# Patient Record
Sex: Female | Born: 1955 | Race: Black or African American | Hispanic: No | Smoking: Former smoker
Health system: Southern US, Community
[De-identification: ages and names within clinical notes are randomized; demographics above are authoritative.]

## PROBLEM LIST (undated history)

## (undated) ENCOUNTER — Ambulatory Visit: Source: Ambulatory Visit

## (undated) ENCOUNTER — Ambulatory Visit: Source: Home / Self Care

## (undated) DIAGNOSIS — M1990 Unspecified osteoarthritis, unspecified site: Secondary | ICD-10-CM

## (undated) DIAGNOSIS — K219 Gastro-esophageal reflux disease without esophagitis: Secondary | ICD-10-CM

## (undated) DIAGNOSIS — I517 Cardiomegaly: Secondary | ICD-10-CM

## (undated) DIAGNOSIS — E7800 Pure hypercholesterolemia, unspecified: Secondary | ICD-10-CM

## (undated) DIAGNOSIS — R0600 Dyspnea, unspecified: Secondary | ICD-10-CM

## (undated) DIAGNOSIS — J45909 Unspecified asthma, uncomplicated: Secondary | ICD-10-CM

## (undated) DIAGNOSIS — M545 Low back pain, unspecified: Secondary | ICD-10-CM

## (undated) DIAGNOSIS — E1140 Type 2 diabetes mellitus with diabetic neuropathy, unspecified: Secondary | ICD-10-CM

## (undated) DIAGNOSIS — K5792 Diverticulitis of intestine, part unspecified, without perforation or abscess without bleeding: Secondary | ICD-10-CM

## (undated) DIAGNOSIS — S0300XA Dislocation of jaw, unspecified side, initial encounter: Secondary | ICD-10-CM

## (undated) DIAGNOSIS — G4730 Sleep apnea, unspecified: Secondary | ICD-10-CM

## (undated) DIAGNOSIS — A159 Respiratory tuberculosis unspecified: Secondary | ICD-10-CM

## (undated) DIAGNOSIS — Z8709 Personal history of other diseases of the respiratory system: Secondary | ICD-10-CM

## (undated) DIAGNOSIS — J449 Chronic obstructive pulmonary disease, unspecified: Secondary | ICD-10-CM

## (undated) DIAGNOSIS — R296 Repeated falls: Secondary | ICD-10-CM

## (undated) DIAGNOSIS — G8929 Other chronic pain: Secondary | ICD-10-CM

## (undated) DIAGNOSIS — E119 Type 2 diabetes mellitus without complications: Secondary | ICD-10-CM

## (undated) DIAGNOSIS — R002 Palpitations: Secondary | ICD-10-CM

## (undated) DIAGNOSIS — E559 Vitamin D deficiency, unspecified: Secondary | ICD-10-CM

## (undated) DIAGNOSIS — G5600 Carpal tunnel syndrome, unspecified upper limb: Secondary | ICD-10-CM

## (undated) DIAGNOSIS — I10 Essential (primary) hypertension: Secondary | ICD-10-CM

## (undated) DIAGNOSIS — I6523 Occlusion and stenosis of bilateral carotid arteries: Secondary | ICD-10-CM

## (undated) DIAGNOSIS — K589 Irritable bowel syndrome without diarrhea: Secondary | ICD-10-CM

## (undated) HISTORY — DX: Pure hypercholesterolemia, unspecified: E78.00

## (undated) HISTORY — PX: EYE SURGERY: SHX253

## (undated) HISTORY — PX: OTHER SURGICAL HISTORY: SHX169

## (undated) HISTORY — DX: Chronic obstructive pulmonary disease, unspecified: J44.9

## (undated) HISTORY — PX: ESOPHAGOGASTRODUODENOSCOPY ENDOSCOPY: SHX5814

## (undated) HISTORY — DX: Low back pain, unspecified: M54.50

## (undated) HISTORY — DX: Other chronic pain: G89.29

## (undated) HISTORY — PX: CARPAL TUNNEL RELEASE: SHX101

## (undated) HISTORY — DX: Type 2 diabetes mellitus with diabetic neuropathy, unspecified: E11.40

## (undated) HISTORY — DX: Occlusion and stenosis of bilateral carotid arteries: I65.23

## (undated) HISTORY — DX: Carpal tunnel syndrome, unspecified upper limb: G56.00

## (undated) HISTORY — DX: Unspecified asthma, uncomplicated: J45.909

## (undated) HISTORY — DX: Unspecified osteoarthritis, unspecified site: M19.90

## (undated) HISTORY — DX: Repeated falls: R29.6

## (undated) HISTORY — DX: Vitamin D deficiency, unspecified: E55.9

## (undated) HISTORY — PX: TONSILLECTOMY: SUR1361

## (undated) HISTORY — PX: NASAL SEPTUM SURGERY: SHX37

## (undated) HISTORY — PX: ROTATOR CUFF REPAIR: SHX139

---

## 1991-12-06 HISTORY — PX: ABDOMINAL HYSTERECTOMY: SHX81

## 2013-12-05 HISTORY — PX: OTHER SURGICAL HISTORY: SHX169

## 2014-07-27 ENCOUNTER — Encounter (HOSPITAL_COMMUNITY): Payer: Self-pay | Admitting: Emergency Medicine

## 2014-07-27 ENCOUNTER — Emergency Department (HOSPITAL_COMMUNITY)
Admission: EM | Admit: 2014-07-27 | Discharge: 2014-07-27 | Disposition: A | Discharge status: Home / Self Care | Payer: Medicare Other | Attending: Emergency Medicine | Admitting: Emergency Medicine

## 2014-07-27 DIAGNOSIS — E119 Type 2 diabetes mellitus without complications: Secondary | ICD-10-CM | POA: Insufficient documentation

## 2014-07-27 DIAGNOSIS — I10 Essential (primary) hypertension: Secondary | ICD-10-CM | POA: Insufficient documentation

## 2014-07-27 DIAGNOSIS — R1310 Dysphagia, unspecified: Secondary | ICD-10-CM | POA: Insufficient documentation

## 2014-07-27 DIAGNOSIS — Z8719 Personal history of other diseases of the digestive system: Secondary | ICD-10-CM | POA: Diagnosis not present

## 2014-07-27 DIAGNOSIS — R1013 Epigastric pain: Secondary | ICD-10-CM | POA: Diagnosis not present

## 2014-07-27 DIAGNOSIS — R110 Nausea: Secondary | ICD-10-CM | POA: Insufficient documentation

## 2014-07-27 DIAGNOSIS — R42 Dizziness and giddiness: Secondary | ICD-10-CM | POA: Diagnosis not present

## 2014-07-27 HISTORY — DX: Diverticulitis of intestine, part unspecified, without perforation or abscess without bleeding: K57.92

## 2014-07-27 HISTORY — DX: Type 2 diabetes mellitus without complications: E11.9

## 2014-07-27 HISTORY — DX: Dislocation of jaw, unspecified side, initial encounter: S03.00XA

## 2014-07-27 HISTORY — DX: Irritable bowel syndrome, unspecified: K58.9

## 2014-07-27 HISTORY — DX: Gastro-esophageal reflux disease without esophagitis: K21.9

## 2014-07-27 HISTORY — DX: Essential (primary) hypertension: I10

## 2014-07-27 LAB — CBC WITH DIFFERENTIAL/PLATELET
Basophils Absolute: 0 10*3/uL (ref 0.0–0.1)
Basophils Relative: 1 % (ref 0–1)
Eosinophils Absolute: 0 10*3/uL (ref 0.0–0.7)
Eosinophils Relative: 0 % (ref 0–5)
HCT: 42.1 % (ref 36.0–46.0)
Hemoglobin: 14.1 g/dL (ref 12.0–15.0)
Lymphocytes Relative: 32 % (ref 12–46)
Lymphs Abs: 1.1 10*3/uL (ref 0.7–4.0)
MCH: 28.9 pg (ref 26.0–34.0)
MCHC: 33.5 g/dL (ref 30.0–36.0)
MCV: 86.3 fL (ref 78.0–100.0)
Monocytes Absolute: 0.3 10*3/uL (ref 0.1–1.0)
Monocytes Relative: 8 % (ref 3–12)
Neutro Abs: 2.1 10*3/uL (ref 1.7–7.7)
Neutrophils Relative %: 59 % (ref 43–77)
Platelets: 203 10*3/uL (ref 150–400)
RBC: 4.88 MIL/uL (ref 3.87–5.11)
RDW: 13.1 % (ref 11.5–15.5)
WBC: 3.5 10*3/uL — ABNORMAL LOW (ref 4.0–10.5)

## 2014-07-27 LAB — COMPREHENSIVE METABOLIC PANEL
ALT: 22 U/L (ref 0–35)
AST: 21 U/L (ref 0–37)
Albumin: 3.8 g/dL (ref 3.5–5.2)
Alkaline Phosphatase: 78 U/L (ref 39–117)
Anion gap: 16 — ABNORMAL HIGH (ref 5–15)
BUN: 6 mg/dL (ref 6–23)
CO2: 25 mEq/L (ref 19–32)
Calcium: 9.2 mg/dL (ref 8.4–10.5)
Chloride: 103 mEq/L (ref 96–112)
Creatinine, Ser: 0.76 mg/dL (ref 0.50–1.10)
GFR calc Af Amer: >90 mL/min (ref >90)
GFR calc non Af Amer: >90 mL/min (ref >90)
Glucose, Bld: 71 mg/dL (ref 70–99)
Potassium: 3.7 mEq/L (ref 3.7–5.3)
Sodium: 144 mEq/L (ref 137–147)
Total Bilirubin: 0.5 mg/dL (ref 0.3–1.2)
Total Protein: 6.7 g/dL (ref 6.0–8.3)

## 2014-07-27 LAB — URINALYSIS, ROUTINE W REFLEX MICROSCOPIC
Bilirubin Urine: NEGATIVE
Glucose, UA: NEGATIVE mg/dL
Hgb urine dipstick: NEGATIVE
Ketones, ur: 40 mg/dL — AB
Leukocytes, UA: NEGATIVE
Nitrite: NEGATIVE
Protein, ur: NEGATIVE mg/dL
Specific Gravity, Urine: 1.01 (ref 1.005–1.030)
Urobilinogen, UA: 0.2 mg/dL (ref 0.0–1.0)
pH: 5 (ref 5.0–8.0)

## 2014-07-27 LAB — LIPASE, BLOOD: Lipase: 21 U/L (ref 11–59)

## 2014-07-27 MED ORDER — FAMOTIDINE IN NACL 20-0.9 MG/50ML-% IV SOLN
20.0000 mg | Freq: Once | INTRAVENOUS | Status: AC
  Administered 2014-07-27: 20 mg via INTRAVENOUS
  Filled 2014-07-27: qty 50

## 2014-07-27 MED ORDER — SODIUM CHLORIDE 0.9 % IV SOLN
INTRAVENOUS | Status: DC
  Administered 2014-07-27: 09:00:00 via INTRAVENOUS

## 2014-07-27 NOTE — ED Notes (Signed)
Following swallow study pt requested water again.  Have some coughing following sipping water.

## 2014-07-27 NOTE — ED Notes (Signed)
Pt states that she feels like she is choking continuously, unable to eat or drink fluids, also c/o burning sensation to throat and upper chest area and having  "alot" of gas, sinus pressure that started three weeks ago, has been seen at several urgent cares and was seen at [facility] and pcp with no improvement in symptoms.

## 2014-07-27 NOTE — ED Notes (Signed)
Having difficulty swallowing for last 2 weeks.  See Dr Posey Pronto in [place].   Last seen via Dr Posey Pronto in May.  Missed last appointment in June and have not reschedule for follow up.  Have been to [facility], [facility] and Dr Franchot Heidelberg.  Have prescriptions for GERD but have not taken any medications d/t difficulty swallowing.

## 2014-07-27 NOTE — Discharge Instructions (Signed)
We are giving you the name of the GI doctor here in [place]. You can call her tomorrow and schedule follow up since you will be living with your daughter for a while. Stay on liquids and eat slowly until further evaluation.

## 2014-07-27 NOTE — ED Provider Notes (Signed)
CSN: [id number]     Arrival date & time 07/27/14  5102 History   First MD Initiated Contact with Patient 07/27/14 [phone]     Chief Complaint  Patient presents with  . Gastrophageal Reflux     (Consider location/radiation/quality/duration/timing/severity/associated sxs/prior Treatment) Patient is a 58 y.o. female presenting with GERD. The history is provided by the patient.  Gastrophageal Reflux This is a new problem. The current episode started 1 to 4 weeks ago. The problem occurs constantly. The problem has been gradually worsening. Associated symptoms include nausea. Pertinent negatives include no chest pain, chills, fever, headaches or rash. Vomiting: with eating when chocking.   Torrie Namba is a 58 y.o. female who presents to the ED with burning in her chest and feeling like she is choking every time she eats or drinks. She has been followed by Dr. Posey Pronto in [place]. Her last visit was in May. She was scheduled for follow up in June but missed her appointment and has not rescheduled. She has been to [facility], [facility] and Dr. Franchot Heidelberg. On her last visit to her PCP, Dr. Franchot Heidelberg, he told her she had lost 10 pounds and was dehydrated. They tried to draw blood and start an IV but were unsuccessful. They told her to go home and drink 8 glasses of water per day. She has been unsuccessful due to chocking.  She has medication for GERD but has not taken due to difficulty swallowing. She complains of a burning sensation to the throat and upper chest and a lot of gas. She is hungry but can't eat due to the choking. She reports that she has recently moved in with her daughter because her house in [place] was condemned.   Past Medical History  Diagnosis Date  . GERD (gastroesophageal reflux disease)   . IBS (irritable bowel syndrome)   . TMJ (dislocation of temporomandibular joint)   . Diverticulitis   . Diabetes mellitus without complication   . Hypertension    Past Surgical History   Procedure Laterality Date  . Esophagogastroduodenoscopy endoscopy      with esophagus being stretched twice per pt,   . Nasal septum surgery    . Tonsillectomy    . Abdominal hysterectomy    . Eye surgery     No family history on file. History  Substance Use Topics  . Smoking status: Never Smoker   . Smokeless tobacco: Not on file  . Alcohol Use: No   OB History   Grav Para Term Preterm Abortions TAB SAB Ect Mult Living                 Review of Systems  Constitutional: Negative for fever and chills.  HENT: Positive for trouble swallowing. Negative for ear pain and facial swelling.   Eyes: Negative for pain and redness.       Feel dry   Respiratory: Positive for choking. Negative for shortness of breath and wheezing. Chest tightness: burning.   Cardiovascular: Negative for chest pain.  Gastrointestinal: Positive for nausea. Vomiting: with eating when chocking.  Genitourinary: Negative for dysuria, urgency, frequency, vaginal bleeding, vaginal discharge and difficulty urinating.  Musculoskeletal: Negative for back pain and neck stiffness.  Skin: Negative for rash.  Neurological: Positive for light-headedness. Negative for syncope and headaches.  Psychiatric/Behavioral: Negative for confusion. The patient is not nervous/anxious.       Allergies  Erythromycin; Morphine and related; and Sulfa antibiotics  Home Medications   Prior to Admission medications   Not  on File   BP 122/81  Pulse 80  Temp(Src) 98 F (36.7 C) (Oral)  Resp 20  Ht 5\' 8"  (1.727 m)  Wt 186 lb (84.369 kg)  BMI 28.29 kg/m2  SpO2 97% Physical Exam  Nursing note and vitals reviewed. Constitutional: She is oriented to person, place, and time. She appears well-developed and well-nourished. No distress.  HENT:  Head: Normocephalic.  Mouth/Throat: Uvula is midline, oropharynx is clear and moist and mucous membranes are normal.  Eyes: EOM are normal.  Neck: Normal range of motion. Neck supple.   Cardiovascular: Normal rate and regular rhythm.   Pulmonary/Chest: Effort normal. She has no wheezes. She has no rales.  Abdominal: Soft. Bowel sounds are normal. There is tenderness in the epigastric area. There is no rebound, no guarding and no CVA tenderness.  Musculoskeletal: Normal range of motion.  Lymphadenopathy:    She has no cervical adenopathy.  Neurological: She is alert and oriented to person, place, and time. No cranial nerve deficit.  Skin: Skin is warm and dry.  Psychiatric: She has a normal mood and affect. Her behavior is normal.   Results for orders placed during the hospital encounter of 07/27/14 (from the past 24 hour(s))  CBC WITH DIFFERENTIAL     Status: Abnormal   Collection Time    07/27/14  9:00 AM      Result Value Ref Range   WBC 3.5 (*) 4.0 - 10.5 K/uL   RBC 4.88  3.87 - 5.11 MIL/uL   Hemoglobin 14.1  12.0 - 15.0 g/dL   HCT 42.1  36.0 - 46.0 %   MCV 86.3  78.0 - 100.0 fL   MCH 28.9  26.0 - 34.0 pg   MCHC 33.5  30.0 - 36.0 g/dL   RDW 13.1  11.5 - 15.5 %   Platelets 203  150 - 400 K/uL   Neutrophils Relative % 59  43 - 77 %   Neutro Abs 2.1  1.7 - 7.7 K/uL   Lymphocytes Relative 32  12 - 46 %   Lymphs Abs 1.1  0.7 - 4.0 K/uL   Monocytes Relative 8  3 - 12 %   Monocytes Absolute 0.3  0.1 - 1.0 K/uL   Eosinophils Relative 0  0 - 5 %   Eosinophils Absolute 0.0  0.0 - 0.7 K/uL   Basophils Relative 1  0 - 1 %   Basophils Absolute 0.0  0.0 - 0.1 K/uL  COMPREHENSIVE METABOLIC PANEL     Status: Abnormal   Collection Time    07/27/14  9:00 AM      Result Value Ref Range   Sodium 144  137 - 147 mEq/L   Potassium 3.7  3.7 - 5.3 mEq/L   Chloride 103  96 - 112 mEq/L   CO2 25  19 - 32 mEq/L   Glucose, Bld 71  70 - 99 mg/dL   BUN 6  6 - 23 mg/dL   Creatinine, Ser 0.76  0.50 - 1.10 mg/dL   Calcium 9.2  8.4 - 10.5 mg/dL   Total Protein 6.7  6.0 - 8.3 g/dL   Albumin 3.8  3.5 - 5.2 g/dL   AST 21  0 - 37 U/L   ALT 22  0 - 35 U/L   Alkaline Phosphatase 78  39  - 117 U/L   Total Bilirubin 0.5  0.3 - 1.2 mg/dL   GFR calc non Af Amer >90  >90 mL/min   GFR calc Af Amer >  90  >90 mL/min   Anion gap 16 (*) 5 - 15  LIPASE, BLOOD     Status: None   Collection Time    07/27/14  9:00 AM      Result Value Ref Range   Lipase 21  11 - 59 U/L  URINALYSIS, ROUTINE W REFLEX MICROSCOPIC     Status: Abnormal   Collection Time    07/27/14  9:01 AM      Result Value Ref Range   Color, Urine YELLOW  YELLOW   APPearance CLEAR  CLEAR   Specific Gravity, Urine 1.010  1.005 - 1.030   pH 5.0  5.0 - 8.0   Glucose, UA NEGATIVE  NEGATIVE mg/dL   Hgb urine dipstick NEGATIVE  NEGATIVE   Bilirubin Urine NEGATIVE  NEGATIVE   Ketones, ur 40 (*) NEGATIVE mg/dL   Protein, ur NEGATIVE  NEGATIVE mg/dL   Urobilinogen, UA 0.2  0.0 - 1.0 mg/dL   Nitrite NEGATIVE  NEGATIVE   Leukocytes, UA NEGATIVE  NEGATIVE    ED Course  Procedures IV hydration, labs, Pepcid IV, swallow test Patient able to eat crackers and drink water without difficulty swallowing and without cough.  MDM  I discussed this case with Dr. Alvino Chapel and since the patient will be living with her daughter we will give her the name of the GI doctor here in [place].   Patient reports that she has an appointment with the "lung doctor" tomorrow because the PCP thinks her symptoms may be related to her lungs rather than her throat. She does have chronic respiratory problems.  58 y.o. female with complaint of difficulty swallowing, stable for discharge after taking PO fluids and crackers without difficulty. She will follow up with pulmonary tomorrow as scheduled and she will call Dr. Oneida Alar' office for GI follow up. She will return here as needed for worsening symptoms. Discussed with the patient and her daughter clinical and lab findings and plan of care. All questioned fully answered.      [facility] Bunnie Pion, NP 07/28/14 1022

## 2014-07-29 NOTE — ED Provider Notes (Signed)
Medical screening examination/treatment/procedure(s) were performed by non-physician practitioner and as supervising physician I was immediately available for consultation/collaboration.   EKG Interpretation None       Jasper Riling. Alvino Chapel, MD 07/29/14 2119

## 2015-04-29 HISTORY — PX: CERVICAL DISC SURGERY: SHX588

## 2016-01-13 ENCOUNTER — Emergency Department
Admission: EM | Admit: 2016-01-13 | Discharge: 2016-01-13 | Disposition: A | Discharge status: Home / Self Care | Payer: Medicare Other | Attending: Emergency Medicine | Admitting: Emergency Medicine

## 2016-01-13 ENCOUNTER — Encounter: Payer: Self-pay | Admitting: *Deleted

## 2016-01-13 DIAGNOSIS — R42 Dizziness and giddiness: Secondary | ICD-10-CM | POA: Insufficient documentation

## 2016-01-13 DIAGNOSIS — R531 Weakness: Secondary | ICD-10-CM | POA: Diagnosis not present

## 2016-01-13 DIAGNOSIS — R32 Unspecified urinary incontinence: Secondary | ICD-10-CM | POA: Diagnosis not present

## 2016-01-13 DIAGNOSIS — H9203 Otalgia, bilateral: Secondary | ICD-10-CM | POA: Diagnosis not present

## 2016-01-13 DIAGNOSIS — R1010 Upper abdominal pain, unspecified: Secondary | ICD-10-CM | POA: Insufficient documentation

## 2016-01-13 DIAGNOSIS — R51 Headache: Secondary | ICD-10-CM | POA: Diagnosis not present

## 2016-01-13 DIAGNOSIS — I10 Essential (primary) hypertension: Secondary | ICD-10-CM | POA: Diagnosis not present

## 2016-01-13 DIAGNOSIS — R1110 Vomiting, unspecified: Secondary | ICD-10-CM | POA: Diagnosis not present

## 2016-01-13 DIAGNOSIS — E119 Type 2 diabetes mellitus without complications: Secondary | ICD-10-CM | POA: Diagnosis not present

## 2016-01-13 DIAGNOSIS — R05 Cough: Secondary | ICD-10-CM | POA: Diagnosis not present

## 2016-01-13 LAB — URINALYSIS COMPLETE WITH MICROSCOPIC (ARMC ONLY)
Bacteria, UA: NONE SEEN
Bilirubin Urine: NEGATIVE
Glucose, UA: NEGATIVE mg/dL
Ketones, ur: NEGATIVE mg/dL
Leukocytes, UA: NEGATIVE
Nitrite: NEGATIVE
Protein, ur: NEGATIVE mg/dL
Specific Gravity, Urine: 1.003 — ABNORMAL LOW (ref 1.005–1.030)
pH: 7 (ref 5.0–8.0)

## 2016-01-13 LAB — BASIC METABOLIC PANEL
Anion gap: 10 (ref 5–15)
BUN: 9 mg/dL (ref 6–20)
CO2: 25 mmol/L (ref 22–32)
Calcium: 8.7 mg/dL — ABNORMAL LOW (ref 8.9–10.3)
Chloride: 102 mmol/L (ref 101–111)
Creatinine, Ser: 0.68 mg/dL (ref 0.44–1.00)
GFR calc Af Amer: >60 mL/min (ref >60)
GFR calc non Af Amer: >60 mL/min (ref >60)
Glucose, Bld: 144 mg/dL — ABNORMAL HIGH (ref 65–99)
Potassium: 3.7 mmol/L (ref 3.5–5.1)
Sodium: 137 mmol/L (ref 135–145)

## 2016-01-13 LAB — CBC
HCT: 41.5 % (ref 35.0–47.0)
Hemoglobin: 14 g/dL (ref 12.0–16.0)
MCH: 29.3 pg (ref 26.0–34.0)
MCHC: 33.7 g/dL (ref 32.0–36.0)
MCV: 87.1 fL (ref 80.0–100.0)
Platelets: 211 10*3/uL (ref 150–440)
RBC: 4.77 MIL/uL (ref 3.80–5.20)
RDW: 13.8 % (ref 11.5–14.5)
WBC: 4.3 10*3/uL (ref 3.6–11.0)

## 2016-01-13 NOTE — ED Notes (Signed)
Pt states headaches, dizziness and weakness, states some abd pain, states she has been waking up in the night covered in urine, states ear aches as well, pt awake and alert, states she was vomiting mucous

## 2016-01-13 NOTE — ED Notes (Signed)
Pt changed her mind and agreeded to have EKG preformed

## 2016-01-13 NOTE — ED Notes (Signed)
Pt refused EKG at time of triage, pt encouraged to get EKG since she is complaining of dizziness and upper abd pain and vomiting but pt refused

## 2016-01-14 ENCOUNTER — Telehealth: Payer: Self-pay | Admitting: Emergency Medicine

## 2016-01-14 NOTE — ED Notes (Signed)
Called patient due to lwot to inquire about condition and follow up plans. Pt says she is still sick.  Has no pcp.  i told her she could come back or she could got to urgent care.  She says she thinks it ist he flu.  i told her she needs to get a pcp especially since she has diabetes.  Says she is on no meds now.  She says she is going to try to get a pcp soon.

## 2016-05-24 ENCOUNTER — Encounter (HOSPITAL_COMMUNITY): Payer: Self-pay | Admitting: Emergency Medicine

## 2016-05-24 ENCOUNTER — Other Ambulatory Visit: Payer: Self-pay

## 2016-05-24 ENCOUNTER — Emergency Department (HOSPITAL_COMMUNITY)
Admission: EM | Admit: 2016-05-24 | Discharge: 2016-05-25 | Disposition: A | Discharge status: Home / Self Care | Payer: Medicare Other | Attending: Emergency Medicine | Admitting: Emergency Medicine

## 2016-05-24 ENCOUNTER — Emergency Department (HOSPITAL_COMMUNITY): Payer: Medicare Other

## 2016-05-24 DIAGNOSIS — K209 Esophagitis, unspecified without bleeding: Secondary | ICD-10-CM

## 2016-05-24 DIAGNOSIS — Z79899 Other long term (current) drug therapy: Secondary | ICD-10-CM | POA: Insufficient documentation

## 2016-05-24 DIAGNOSIS — I10 Essential (primary) hypertension: Secondary | ICD-10-CM | POA: Insufficient documentation

## 2016-05-24 DIAGNOSIS — E119 Type 2 diabetes mellitus without complications: Secondary | ICD-10-CM | POA: Insufficient documentation

## 2016-05-24 DIAGNOSIS — R079 Chest pain, unspecified: Secondary | ICD-10-CM | POA: Diagnosis present

## 2016-05-24 LAB — BASIC METABOLIC PANEL
Anion gap: 7 (ref 5–15)
BUN: 10 mg/dL (ref 6–20)
CO2: 25 mmol/L (ref 22–32)
Calcium: 9.7 mg/dL (ref 8.9–10.3)
Chloride: 107 mmol/L (ref 101–111)
Creatinine, Ser: 0.77 mg/dL (ref 0.44–1.00)
GFR calc Af Amer: >60 mL/min (ref >60)
GFR calc non Af Amer: >60 mL/min (ref >60)
Glucose, Bld: 108 mg/dL — ABNORMAL HIGH (ref 65–99)
Potassium: 3.9 mmol/L (ref 3.5–5.1)
Sodium: 139 mmol/L (ref 135–145)

## 2016-05-24 LAB — CBC
HCT: 43.4 % (ref 36.0–46.0)
Hemoglobin: 14 g/dL (ref 12.0–15.0)
MCH: 27.6 pg (ref 26.0–34.0)
MCHC: 32.3 g/dL (ref 30.0–36.0)
MCV: 85.6 fL (ref 78.0–100.0)
Platelets: 222 10*3/uL (ref 150–400)
RBC: 5.07 MIL/uL (ref 3.87–5.11)
RDW: 13 % (ref 11.5–15.5)
WBC: 4.5 10*3/uL (ref 4.0–10.5)

## 2016-05-24 LAB — I-STAT TROPONIN, ED: Troponin i, poc: 0 ng/mL (ref 0.00–0.08)

## 2016-05-24 MED ORDER — SUCRALFATE 1 GM/10ML PO SUSP
1.0000 g | Freq: Once | ORAL | Status: AC
  Administered 2016-05-25: 1 g via ORAL
  Filled 2016-05-24: qty 10

## 2016-05-24 MED ORDER — PANTOPRAZOLE SODIUM 40 MG PO PACK
40.0000 mg | PACK | Freq: Once | ORAL | Status: AC
  Administered 2016-05-25: 40 mg via ORAL
  Filled 2016-05-24: qty 20

## 2016-05-24 MED ORDER — LANSOPRAZOLE 3 MG/ML SUSP
15.0000 mg | Freq: Once | ORAL | Status: DC
  Filled 2016-05-24: qty 5

## 2016-05-24 NOTE — ED Notes (Signed)
Pt states "my chest started burning like a fire pit, fire went down both my arms". "It feels like something is stuck up there". Hx of esophagitis. Pt c/o "something wrong with my ears too, they are hurting".

## 2016-05-24 NOTE — ED Provider Notes (Signed)
CSN: [id number]     Arrival date & time 05/24/16  1846 History   First MD Initiated Contact with Patient 05/24/16 2240     Chief Complaint  Patient presents with  . Chest Pain     (Consider location/radiation/quality/duration/timing/severity/associated sxs/prior Treatment) HPI Comments: Patient with a complicated medical history including DM (off medications x 4 months), esophagitis, HTN, IBS, presents with burning type chest pain and difficulty swallowing. Since yesterday she feels like food is getting stuck. No vomiting. Food does not come back up when eaten. She has had similar symptoms in the past. She reports she had complications from cervical surgery that required PEG tube placement which was removed only recently. No fever or cough. She also complains of bilateral ear pain and drainage without hearing loss or change. She has a frontal headache and reports chills. Her throat is "scratchy". She states she has nausea without vomiting. She also reports increased urination but no dysuria.   Patient is a 60 y.o. female presenting with chest pain. The history is provided by the patient. No language interpreter was used.  Chest Pain Pain quality: burning   Pain radiates to:  Does not radiate Pain radiates to the back: no   Associated symptoms: dysphagia (See HPI.) and nausea   Associated symptoms: no cough, no fever, no shortness of breath, not vomiting and no weakness     Past Medical History  Diagnosis Date  . GERD (gastroesophageal reflux disease)   . IBS (irritable bowel syndrome)   . TMJ (dislocation of temporomandibular joint)   . Diverticulitis   . Diabetes mellitus without complication ([place])   . Hypertension    Past Surgical History  Procedure Laterality Date  . Esophagogastroduodenoscopy endoscopy      with esophagus being stretched twice per pt,   . Nasal septum surgery    . Tonsillectomy    . Abdominal hysterectomy    . Eye surgery     No family history on  file. Social History  Substance Use Topics  . Smoking status: Never Smoker   . Smokeless tobacco: None  . Alcohol Use: No   OB History    No data available     Review of Systems  Constitutional: Negative for fever and chills.  HENT: Positive for ear pain, sinus pressure, sore throat and trouble swallowing (See HPI.).   Respiratory: Negative.  Negative for cough and shortness of breath.   Cardiovascular: Positive for chest pain.  Gastrointestinal: Positive for nausea. Negative for vomiting.  Endocrine: Positive for polyuria.  Genitourinary: Positive for frequency. Negative for dysuria.  Musculoskeletal: Negative.  Negative for myalgias.  Neurological: Negative.  Negative for weakness and light-headedness.      Allergies  Erythromycin; Levaquin; Morphine and related; and Sulfa antibiotics  Home Medications   Prior to Admission medications   Medication Sig Start Date End Date Taking? Authorizing Provider  acyclovir (ZOVIRAX) 800 MG tablet Take 800 mg by mouth 2 (two) times daily.   Yes Historical Provider, MD  albuterol (PROVENTIL HFA;VENTOLIN HFA) 108 (90 BASE) MCG/ACT inhaler Inhale 2 puffs into the lungs every 6 (six) hours as needed for wheezing or shortness of breath.   Yes Historical Provider, MD  albuterol (PROVENTIL) (2.5 MG/3ML) 0.083% nebulizer solution Take 2.5 mg by nebulization 4 (four) times daily as needed for wheezing or shortness of breath.   Yes Historical Provider, MD  cetirizine HCl (ZYRTEC) 5 MG/5ML SYRP Take 10 mg by mouth daily.   Yes Historical Provider, MD  dicyclomine (  BENTYL) 10 MG capsule Take 10 mg by mouth 3 (three) times daily as needed for spasms.   Yes Historical Provider, MD  Fluticasone-Salmeterol (ADVAIR) 250-50 MCG/DOSE AEPB Inhale 1 puff into the lungs 2 (two) times daily.   Yes Historical Provider, MD  metoprolol tartrate (LOPRESSOR) 25 MG tablet Take 25 mg by mouth 2 (two) times daily.   Yes Historical Provider, MD  nitroGLYCERIN (NITROSTAT)  0.4 MG SL tablet Place 0.4 mg under the tongue every 5 (five) minutes as needed for chest pain.   Yes Historical Provider, MD   BP 145/87 mmHg  Pulse 77  Temp(Src) 98.7 F (37.1 C) (Oral)  Resp 16  Ht 5\' 7"  (1.702 m)  Wt 95.709 kg  BMI 33.04 kg/m2  SpO2 100% Physical Exam  Constitutional: She is oriented to person, place, and time. She appears well-developed and well-nourished.  HENT:  Head: Normocephalic.  Nose: No mucosal edema.  Mouth/Throat: Oropharynx is clear and moist.  Right TM has mild erythema around border with middle ear effusion. Left TM also has minimal erythema, no effusion.   Eyes: Conjunctivae are normal.  Neck: Normal range of motion. Neck supple.  Cardiovascular: Normal rate and regular rhythm.   Pulmonary/Chest: Effort normal and breath sounds normal. She has no wheezes. She has no rales.  Abdominal: Soft. Bowel sounds are normal. There is no tenderness. There is no rebound and no guarding.  Musculoskeletal: Normal range of motion. She exhibits no edema.  Neurological: She is alert and oriented to person, place, and time.  Skin: Skin is warm and dry. No rash noted.  Psychiatric: She has a normal mood and affect.    ED Course  Procedures (including critical care time) Labs Review Labs Reviewed  BASIC METABOLIC PANEL - Abnormal; Notable for the following:    Glucose, Bld 108 (*)    All other components within normal limits  CBC  I-STAT TROPOININ, ED   Results for orders placed or performed during the hospital encounter of 0000000  Basic metabolic panel  Result Value Ref Range   Sodium 139 135 - 145 mmol/L   Potassium 3.9 3.5 - 5.1 mmol/L   Chloride 107 101 - 111 mmol/L   CO2 25 22 - 32 mmol/L   Glucose, Bld 108 (H) 65 - 99 mg/dL   BUN 10 6 - 20 mg/dL   Creatinine, Ser 0.77 0.44 - 1.00 mg/dL   Calcium 9.7 8.9 - 10.3 mg/dL   GFR calc non Af Amer >60 >60 mL/min   GFR calc Af Amer >60 >60 mL/min   Anion gap 7 5 - 15  CBC  Result Value Ref Range    WBC 4.5 4.0 - 10.5 K/uL   RBC 5.07 3.87 - 5.11 MIL/uL   Hemoglobin 14.0 12.0 - 15.0 g/dL   HCT 43.4 36.0 - 46.0 %   MCV 85.6 78.0 - 100.0 fL   MCH 27.6 26.0 - 34.0 pg   MCHC 32.3 30.0 - 36.0 g/dL   RDW 13.0 11.5 - 15.5 %   Platelets 222 150 - 400 K/uL  I-stat troponin, ED  Result Value Ref Range   Troponin i, poc 0.00 0.00 - 0.08 ng/mL   Comment 3           Dg Chest 2 View  05/24/2016  CLINICAL DATA:  Heart burn X 2 days with nausea. Hx HTN, diabetes EXAM: CHEST - 2 VIEW COMPARISON:  none FINDINGS: Lungs are clear. Heart size upper limits normal.  Mildly tortuous thoracic  aorta. No effusion.  No pneumothorax. Anterior vertebral endplate spurring at multiple levels in the mid and lower thoracic spine. Fixation hardware in the lower cervical spine. IMPRESSION: No acute cardiopulmonary disease. Electronically Signed   By: Lucrezia Europe M.D.   On: 05/24/2016 23:42    Imaging Review No results found. I have personally reviewed and evaluated these images and lab results as part of my medical decision-making.   EKG Interpretation None      MDM   Final diagnoses:  None    1. Esophagitis  Patient presents with burning type chest pain, history of esophagitis, out of medications (Prevacid, carafate) for weeks. No SOB. No evidence esophageal obstruction. Negative troponin, non-ischemic EKG - symptoms atypical for ACS. Discussed with Dr. Dina Rich who feels the patient is appropriate for discharge home.   Charlann Lange, PA-C 05/25/16 0025  Merryl Hacker, MD 05/25/16 [phone]

## 2016-05-25 ENCOUNTER — Telehealth (HOSPITAL_BASED_OUTPATIENT_CLINIC_OR_DEPARTMENT_OTHER): Payer: Self-pay | Admitting: *Deleted

## 2016-05-25 DIAGNOSIS — K209 Esophagitis, unspecified: Secondary | ICD-10-CM | POA: Diagnosis not present

## 2016-05-25 MED ORDER — LANSOPRAZOLE 3 MG/ML SUSP: 15.0000 mg | Freq: Every day | ORAL | Status: DC

## 2016-05-25 MED ORDER — SUCRALFATE 1 GM/10ML PO SUSP: 1.0000 g | Freq: Three times a day (TID) | ORAL | Status: DC

## 2016-05-25 MED ORDER — GI COCKTAIL ~~LOC~~
30.0000 mL | Freq: Once | ORAL | Status: AC
  Administered 2016-05-25: 30 mL via ORAL
  Filled 2016-05-25: qty 30

## 2016-05-25 NOTE — Discharge Instructions (Signed)
Esophagitis °Esophagitis is inflammation of the esophagus. The esophagus is the tube that carries food and liquids from your mouth to your stomach. Esophagitis can cause soreness or pain in the esophagus. This condition can make it difficult and painful to swallow.  °CAUSES °Most causes of esophagitis are not serious. Common causes of this condition include: °· Gastroesophageal reflux disease (GERD). This is when stomach contents move back up into the esophagus (reflux). °· Repeated vomiting. °· An allergic-type reaction, especially caused by food allergies (eosinophilic esophagitis). °· Injury to the esophagus by swallowing large pills with or without water, or swallowing certain types of medicines. °· Swallowing (ingesting) harmful chemicals, such as household cleaning products. °· Heavy alcohol use. °· An infection of the esophagus. This most often occurs in people who have a weakened immune system. °· Radiation or chemotherapy treatment for cancer. °· Certain diseases such as sarcoidosis, Crohn disease, and scleroderma. °SYMPTOMS °Symptoms of this condition include: °· Difficult or painful swallowing. °· Pain with swallowing acidic liquids, such as citrus juices. °· Pain with burping. °· Chest pain. °· Difficulty breathing. °· Nausea. °· Vomiting. °· Pain in the abdomen. °· Weight loss. °· Ulcers in the mouth. °· Patches of white material in the mouth (candidiasis). °· Fever. °· Coughing up blood or vomiting blood. °· Stool that is black, tarry, or bright red. °DIAGNOSIS °Your health care provider will take a medical history and perform a physical exam. You may also have other tests, including: °· An endoscopy to examine your stomach and esophagus with a small camera. °· A test that measures the acidity level in your esophagus. °· A test that measures how much pressure is on your esophagus. °· A barium swallow or modified barium swallow to show the shape, size, and functioning of your esophagus. °· Allergy  tests. °TREATMENT °Treatment for this condition depends on the cause of your esophagitis. In some cases, steroids or other medicines may be given to help relieve your symptoms or to treat the underlying cause of your condition. You may have to make some lifestyle changes, such as: °· Avoiding alcohol. °· Quitting smoking. °· Changing your diet. °· Exercising. °· Changing your sleep habits and your sleep environment. °HOME CARE INSTRUCTIONS °Take these actions to decrease your discomfort and to help avoid complications. °Diet °· Follow a diet as recommended by your health care provider. This may involve avoiding foods and drinks such as: °¨ Coffee and tea (with or without caffeine). °¨ Drinks that contain alcohol. °¨ Energy drinks and sports drinks. °¨ Carbonated drinks or sodas. °¨ Chocolate and cocoa. °¨ Peppermint and mint flavorings. °¨ Garlic and onions. °¨ Horseradish. °¨ Spicy and acidic foods, including peppers, chili powder, curry powder, vinegar, hot sauces, and barbecue sauce. °¨ Citrus fruit juices and citrus fruits, such as oranges, lemons, and limes. °¨ Tomato-based foods, such as red sauce, chili, salsa, and pizza with red sauce. °¨ Fried and fatty foods, such as donuts, french fries, potato chips, and high-fat dressings. °¨ High-fat meats, such as hot dogs and fatty cuts of red and white meats, such as rib eye steak, sausage, ham, and bacon. °¨ High-fat dairy items, such as whole milk, butter, and cream cheese. °· Eat small, frequent meals instead of large meals. °· Avoid drinking large amounts of liquid with your meals. °· Avoid eating meals during the 2-3 hours before bedtime. °· Avoid lying down right after you eat. °· Do not exercise right after you eat. °· Avoid foods and drinks that seem to   make your symptoms worse. °General Instructions °· Pay attention to any changes in your symptoms. °· Take over-the-counter and prescription medicines only as told by your health care provider. Do not take  aspirin, ibuprofen, or other NSAIDs unless your health care provider told you to do so. °· If you have trouble taking pills, use a pill splitter to decrease the size of the pill. This will decrease the chance of the pill getting stuck or injuring your esophagus on the way down. Also, drink water after you take a pill. °· Do not use any tobacco products, including cigarettes, chewing tobacco, and e-cigarettes. If you need help quitting, ask your health care provider. °· Wear loose-fitting clothing. Do not wear anything tight around your waist that causes pressure on your abdomen. °· Raise (elevate) the head of your bed about 6 inches (15 cm). °· Try to reduce your stress, such as with yoga or meditation. If you need help reducing stress, ask your health care provider. °· If you are overweight, reduce your weight to an amount that is healthy for you. Ask your health care provider for guidance about a safe weight loss goal. °· Keep all follow-up visits as told by your health care provider. This is important. °SEEK MEDICAL CARE IF: °· You have new symptoms. °· You have unexplained weight loss. °· You have difficulty swallowing, or it hurts to swallow. °· You have wheezing or a persistent cough. °· Your symptoms do not improve with treatment. °· You have frequent heartburn for more than two weeks. °SEEK IMMEDIATE MEDICAL CARE IF: °· You have severe pain in your arms, neck, jaw, teeth, or back. °· You feel sweaty, dizzy, or light-headed. °· You have chest pain or shortness of breath. °· You vomit and your vomit looks like blood or coffee grounds. °· Your stool is bloody or black. °· You have a fever. °· You cannot swallow, drink, or eat. °  °This information is not intended to replace advice given to you by your health care provider. Make sure you discuss any questions you have with your health care provider. °  °Document Released: 12/29/2004 Document Revised: 08/12/2015 Document Reviewed: 03/18/2015 °[employer] Interactive  Patient Education ©2016 [employer] ° °

## 2016-05-26 ENCOUNTER — Telehealth (HOSPITAL_COMMUNITY): Payer: Self-pay

## 2016-05-26 NOTE — Telephone Encounter (Signed)
Pharmacy calling for alternative for prevacid suspension, unavailable (per pharmacy pt picked up liquid ranitidine today from diff prescriber) and if its okay to use carafate tablets to make a slurry to put through pts PEG tube.  Dr Vanita Panda consulted ok switch to Carafate pill to make slurry of equal strength and pt can just use Ranitidine liquid and not fill Prevacid susp. and if pt continues to have problem consult PCP.

## 2016-08-18 ENCOUNTER — Encounter: Payer: Self-pay | Admitting: Physician Assistant

## 2016-09-01 ENCOUNTER — Ambulatory Visit (INDEPENDENT_AMBULATORY_CARE_PROVIDER_SITE_OTHER): Payer: Medicare Other | Admitting: Physician Assistant

## 2016-09-01 ENCOUNTER — Encounter: Payer: Self-pay | Admitting: Physician Assistant

## 2016-09-01 ENCOUNTER — Other Ambulatory Visit (INDEPENDENT_AMBULATORY_CARE_PROVIDER_SITE_OTHER): Payer: Medicare Other

## 2016-09-01 VITALS — BP 124/86 | HR 76 | Ht 67.0 in | Wt 202.5 lb

## 2016-09-01 DIAGNOSIS — R1310 Dysphagia, unspecified: Secondary | ICD-10-CM | POA: Diagnosis not present

## 2016-09-01 DIAGNOSIS — R1013 Epigastric pain: Secondary | ICD-10-CM | POA: Diagnosis not present

## 2016-09-01 DIAGNOSIS — R1084 Generalized abdominal pain: Secondary | ICD-10-CM | POA: Diagnosis not present

## 2016-09-01 LAB — CBC WITH DIFFERENTIAL/PLATELET
Basophils Absolute: 0 10*3/uL (ref 0.0–0.1)
Basophils Relative: 0.5 % (ref 0.0–3.0)
Eosinophils Absolute: 0.1 10*3/uL (ref 0.0–0.7)
Eosinophils Relative: 1.1 % (ref 0.0–5.0)
HCT: 44.6 % (ref 36.0–46.0)
Hemoglobin: 15.1 g/dL — ABNORMAL HIGH (ref 12.0–15.0)
Lymphocytes Relative: 28.5 % (ref 12.0–46.0)
Lymphs Abs: 1.6 10*3/uL (ref 0.7–4.0)
MCHC: 34 g/dL (ref 30.0–36.0)
MCV: 85.3 fl (ref 78.0–100.0)
Monocytes Absolute: 0.5 10*3/uL (ref 0.1–1.0)
Monocytes Relative: 8 % (ref 3.0–12.0)
Neutro Abs: 3.6 10*3/uL (ref 1.4–7.7)
Neutrophils Relative %: 61.9 % (ref 43.0–77.0)
Platelets: 249 10*3/uL (ref 150.0–400.0)
RBC: 5.23 Mil/uL — ABNORMAL HIGH (ref 3.87–5.11)
RDW: 14 % (ref 11.5–15.5)
WBC: 5.7 10*3/uL (ref 4.0–10.5)

## 2016-09-01 LAB — COMPREHENSIVE METABOLIC PANEL
ALT: 13 U/L (ref 0–35)
AST: 13 U/L (ref 0–37)
Albumin: 4 g/dL (ref 3.5–5.2)
Alkaline Phosphatase: 125 U/L — ABNORMAL HIGH (ref 39–117)
BUN: 16 mg/dL (ref 6–23)
CO2: 30 mEq/L (ref 19–32)
Calcium: 9.3 mg/dL (ref 8.4–10.5)
Chloride: 106 mEq/L (ref 96–112)
Creatinine, Ser: 0.84 mg/dL (ref 0.40–1.20)
GFR: 89 mL/min (ref >60.00)
Glucose, Bld: 106 mg/dL — ABNORMAL HIGH (ref 70–99)
Potassium: 4.8 mEq/L (ref 3.5–5.1)
Sodium: 144 mEq/L (ref 135–145)
Total Bilirubin: 0.4 mg/dL (ref 0.2–1.2)
Total Protein: 7.1 g/dL (ref 6.0–8.3)

## 2016-09-01 MED ORDER — SUCRALFATE 1 GM/10ML PO SUSP: 1.0000 g | Freq: Three times a day (TID) | ORAL | 1 refills | Status: DC

## 2016-09-01 NOTE — Progress Notes (Signed)
Subjective:    Patient ID: Pamela Roberson, female    DOB: [birth year], 60 y.o.   MRN: [id number]  HPI  Pamela Roberson is a pleasant 60 year old African-American female, new to GI today referred by Pamela Boozer NP for evaluation of abdominal pain and dysphagia. Patient had undergone prior GI evaluation in [place] which was fairly extensive and then had one consultation with a Dr. Collier Roberson he at [facility] in June 2016 but was unable to follow as her insurance was dropped. Patient describes onset of dysphagia at some point in 2015 which became rather severe and was associated with malnutrition and weight loss. She ultimately required PEG placement and then nursing home placement for rehabilitation. 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 She ultimately underwent a cervical fusion in 2016. She was able to have her PEG tube removed last fall and had had improvement in her dysphagia symptoms. She says she has been maintaining her weight since that time that she continues to have dysphagia symptoms. She also has mentions history of diverticulosis and a colon polyp and believe last had colonoscopy in 2008. Exline Patient describes intermittent episodes of chest pain with swallowing. She says she feels as if her esophagus "closes up and this can be quite painful. This may occur twice per month. She also has. Frequent solid food dysphagia with tightness and pressure in her esophagus with eating. She does have occasional episodes of coughing and choking but generally no regurgitation. She has very frequent reflux symptoms. She also describes upper abdominal pain present over the past couple of months and exacerbated after a course of antibiotics for an upper  respiratory infection. She brought a bag of medicines with her today, has Bentyl which she uses for her abdominal discomfort as needed, Carafate which she has not been taking regularly and omeprazole 40 mg by mouth daily which she had run out of.  Review of Systems Pertinent positive and negative review of systems were noted in the above HPI section.  All other review of systems was otherwise negative.  Outpatient Encounter Prescriptions as of 09/01/2016  Medication Sig  . acyclovir (ZOVIRAX) 800 MG tablet Take 800 mg by mouth 2 (two) times daily.  Marland Kitchen albuterol (PROVENTIL HFA;VENTOLIN HFA) 108 (90 BASE) MCG/ACT inhaler Inhale 2 puffs into the lungs every 6 (six) hours as needed for wheezing or shortness of breath.  . cyclobenzaprine (FLEXERIL) 10 MG tablet Take 10 mg by mouth 3 (three) times daily as needed for muscle spasms.  Marland Kitchen dicyclomine (BENTYL) 10 MG capsule Take 10 mg by mouth 3 (three) times daily as needed for spasms.  Marland Kitchen EPINEPHrine 0.3 mg/0.3 mL IJ SOAJ injection Inject into the muscle once.  . Fluticasone-Salmeterol (ADVAIR) 250-50 MCG/DOSE AEPB Inhale 1 puff into the lungs 2 (two) times daily.  . metoprolol tartrate (LOPRESSOR) 25 MG tablet Take 25 mg by mouth 2 (two) times daily.  . montelukast (SINGULAIR) 10 MG tablet Take 10 mg by mouth at bedtime.  Marland Kitchen omeprazole (PRILOSEC) 40 MG capsule Take 40 mg by mouth daily.  . sucralfate (CARAFATE) 1 GM/10ML suspension Take 10 mLs (1 g total) by mouth 4 (four) times daily -  with meals and at bedtime.  . [DISCONTINUED] albuterol (PROVENTIL) (2.5 MG/3ML) 0.083% nebulizer solution Take 2.5 mg  by nebulization 4 (four) times daily as needed for wheezing or shortness of breath.  . [DISCONTINUED] cetirizine HCl (ZYRTEC) 5 MG/5ML SYRP Take 10 mg by mouth daily.  . [DISCONTINUED] lansoprazole (PREVACID) 3 mg/ml SUSP oral suspension Place 5 mLs (15 mg total) into feeding tube daily at 12 noon.  . [DISCONTINUED] nitroGLYCERIN (NITROSTAT) 0.4 MG SL tablet  Place 0.4 mg under the tongue every 5 (five) minutes as needed for chest pain.   No facility-administered encounter medications on file as of 09/01/2016.    Allergies  Allergen Reactions  . Erythromycin Swelling and Rash  . Levaquin [Levofloxacin In D5w]   . Morphine And Related Nausea And Vomiting and Rash  . Sulfa Antibiotics Rash   There are no active problems to display for this patient.  Social History   Social History  . Marital status: Widowed    Spouse name: N/A  . Number of children: 3  . Years of education: N/A   Occupational History  . Not on file.   Social History Main Topics  . Smoking status: Never Smoker  . Smokeless tobacco: Never Used  . Alcohol use No  . Drug use: No  . Sexual activity: Not on file   Other Topics Concern  . Not on file   Social History Narrative  . No narrative on file    Pamela Roberson's family history is not on file.      Objective:    Vitals:   09/01/16 0825  BP: 124/86  Pulse: 76    Physical Exam  well-developed African-American female in no acute distress, pleasant blood pressure 124/86 pulse 76 height 5 foot 7 weight 202 BMI 31.7. HEENT; nontraumatic, cephalic EOMI PERRLA sclera anicteric, Cardiovascular; regular rate and rhythm with S1-S2 no murmur or gallop, Pulmonary ;clear bilaterally, Abdomen; soft she is tender across the upper abdomen rather generally there is no guarding or rebound no palpable mass or hepatosplenomegaly bowel sounds are present, Rectal ;exam not done, Ext;no clubbing cyanosis or edema skin warm and dry, Neuropsych; mood and affect appropriate       Assessment & Plan:   #82 60 year old African-American female with chronic dysphagia, worsened again over the past several months. Etiology of her dysphagia is not entirely clear. She has had symptoms in the past severe enough to require PEG tube placement but no definite diagnosis made other than cervical osteophytes for which she has since undergone a  cervical fusion. She describes intermittent episodes of solid food dysphagia, soreness and pressure in her chest, frequent reflux and once or twice per month with a severe episode of chest pressure and spasm with swallowing. I suspect she has an underlying motility disorder rule out DES rule out achalasia  #2 hypertension #3 IBS #4 status post hysterectomy #5 GERD #6 history of PEG placement 2015 for dysphagia removed fall 2016 #7 new upper abdominal pain 3 months  Plan Refill omeprazole 40 mg by mouth daily Asked patient to take Carafate 1 g as a slurry 4 times daily between meals and at bedtime for her upper abdominal pain We'll schedule for CT scan of the abdomen and pelvis Schedule for barium swallow with tablet We discussed EGD and esophageal manometry. Patient will be established with Dr. Silverio Decamp and will decide on order further workup pending barium swallow.   Jorell Agne S Enedelia Martorelli PA-C 09/01/2016   Cc: No ref. provider found

## 2016-09-01 NOTE — Patient Instructions (Signed)
Please go to the basement level to have your labs drawn.  We sent prescription for Omeprazole 40 mg, Take 1 every morning. Increase carafatet 1 gram 4 times daily with meals and at bedtime.  You have been scheduled for a Barium Esophogram at [facility] Radiology (1st floor of the hospital) on 09-09-2016 at 10:30 am. Please arrive at 10:15 AM  to your appointment for registration. Make certain not to have anything to eat or drink 6 hours prior to your test. If you need to reschedule for any reason, please contact radiology at [phone] to do so. __________________________________________________________________ A barium swallow is an examination that concentrates on views of the esophagus. This tends to be a double contrast exam (barium and two liquids which, when combined, create a gas to distend the wall of the oesophagus) or single contrast (non-ionic iodine based). The study is usually tailored to your symptoms so a good history is essential. Attention is paid during the study to the form, structure and configuration of the esophagus, looking for functional disorders (such as aspiration, dysphagia, achalasia, motility and reflux) EXAMINATION You may be asked to change into a gown, depending on the type of swallow being performed. A radiologist and radiographer will perform the procedure. The radiologist will advise you of the type of contrast selected for your procedure and direct you during the exam. You will be asked to stand, sit or lie in several different positions and to hold a small amount of fluid in your mouth before being asked to swallow while the imaging is performed .In some instances you may be asked to swallow barium coated marshmallows to assess the motility of a solid food bolus. The exam can be recorded as a digital or video fluoroscopy procedure. POST PROCEDURE It will take 1-2 days for the barium to pass through your system. To facilitate this, it is important, unless otherwise  directed, to increase your fluids for the next 24-48hrs and to resume your normal diet.  This test typically takes about 30 minutes to perform. __________________________________________________________________________________  Dennis Bast have been scheduled for a CT scan of the abdomen and pelvis at [facility] ([address]---this is in the same building as Press [facility]).   You are scheduled on 09-06-2016 at 9:00 am. You should arrive at 8:45 am to your appointment time for registration. Please follow the written instructions below on the day of your exam:  WARNING: IF YOU ARE ALLERGIC TO IODINE/X-RAY DYE, PLEASE NOTIFY RADIOLOGY IMMEDIATELY AT [phone]! YOU WILL BE GIVEN A 13 HOUR PREMEDICATION PREP.  1) Do not eat or drink anything after 5:00 am (4 hours prior to your test) 2) You have been given 2 bottles of oral contrast to drink. The solution may taste               better if refrigerated, but do NOT add ice or any other liquid to this solution. Shake             well before drinking.    Drink 1 bottle of contrast @ 7:00 am(2 hours prior to your exam)  Drink 1 bottle of contrast @ 8:00 am (1 hour prior to your exam)  You may take any medications as prescribed with a small amount of water except for the following: Metformin, Glucophage, Glucovance, Avandamet, Riomet, Fortamet, Actoplus Met, Janumet, Glumetza or Metaglip. The above medications must be held the day of the exam AND 48 hours after the exam.  The purpose of you drinking  the oral contrast is to aid in the visualization of your intestinal tract. The contrast solution may cause some diarrhea. Before your exam is started, you will be given a small amount of fluid to drink. Depending on your individual set of symptoms, you may also receive an intravenous injection of x-ray contrast/dye. Plan on being at [facility] for 30 minutes or long, depending on the type of exam you are having performed.  If you have  any questions regarding your exam or if you need to reschedule, you may call the CT department at [phone] between the hours of 8:00 am and 5:00 pm, Monday-Friday.  ________________________________________________________________________

## 2016-09-02 NOTE — Progress Notes (Signed)
Reviewed and agree with documentation and assessment and plan. K. Veena Thane Age , MD   

## 2016-09-05 ENCOUNTER — Ambulatory Visit: Payer: Medicare Other | Admitting: Gastroenterology

## 2016-09-06 ENCOUNTER — Ambulatory Visit (INDEPENDENT_AMBULATORY_CARE_PROVIDER_SITE_OTHER)
Admission: RE | Admit: 2016-09-06 | Discharge: 2016-09-06 | Disposition: A | Discharge status: Home / Self Care | Payer: Medicare Other | Source: Ambulatory Visit | Attending: Physician Assistant | Admitting: Physician Assistant

## 2016-09-06 DIAGNOSIS — R1013 Epigastric pain: Secondary | ICD-10-CM

## 2016-09-06 DIAGNOSIS — R1084 Generalized abdominal pain: Secondary | ICD-10-CM | POA: Diagnosis not present

## 2016-09-06 DIAGNOSIS — R1310 Dysphagia, unspecified: Secondary | ICD-10-CM

## 2016-09-06 MED ORDER — IOPAMIDOL (ISOVUE-300) INJECTION 61%
100.0000 mL | Freq: Once | INTRAVENOUS | Status: AC | PRN
  Administered 2016-09-06: 100 mL via INTRAVENOUS

## 2016-09-08 ENCOUNTER — Emergency Department (HOSPITAL_COMMUNITY)
Admission: EM | Admit: 2016-09-08 | Discharge: 2016-09-08 | Disposition: A | Discharge status: Home / Self Care | Payer: Medicare Other | Attending: Emergency Medicine | Admitting: Emergency Medicine

## 2016-09-08 ENCOUNTER — Encounter (HOSPITAL_COMMUNITY): Payer: Self-pay | Admitting: Emergency Medicine

## 2016-09-08 ENCOUNTER — Emergency Department (HOSPITAL_COMMUNITY): Payer: Medicare Other

## 2016-09-08 DIAGNOSIS — I10 Essential (primary) hypertension: Secondary | ICD-10-CM | POA: Insufficient documentation

## 2016-09-08 DIAGNOSIS — Z7984 Long term (current) use of oral hypoglycemic drugs: Secondary | ICD-10-CM | POA: Diagnosis not present

## 2016-09-08 DIAGNOSIS — E119 Type 2 diabetes mellitus without complications: Secondary | ICD-10-CM | POA: Diagnosis not present

## 2016-09-08 DIAGNOSIS — J45909 Unspecified asthma, uncomplicated: Secondary | ICD-10-CM | POA: Diagnosis not present

## 2016-09-08 DIAGNOSIS — Z79899 Other long term (current) drug therapy: Secondary | ICD-10-CM | POA: Diagnosis not present

## 2016-09-08 DIAGNOSIS — J069 Acute upper respiratory infection, unspecified: Secondary | ICD-10-CM | POA: Diagnosis not present

## 2016-09-08 DIAGNOSIS — B9789 Other viral agents as the cause of diseases classified elsewhere: Secondary | ICD-10-CM

## 2016-09-08 LAB — RAPID STREP SCREEN (MED CTR MEBANE ONLY): Streptococcus, Group A Screen (Direct): NEGATIVE

## 2016-09-08 MED ORDER — PREDNISONE 10 MG PO TABS: 60.0000 mg | ORAL_TABLET | Freq: Every day | ORAL | 0 refills | Status: DC

## 2016-09-08 MED ORDER — PREDNISONE 20 MG PO TABS
60.0000 mg | ORAL_TABLET | Freq: Once | ORAL | Status: AC
  Administered 2016-09-08: 60 mg via ORAL
  Filled 2016-09-08: qty 3

## 2016-09-08 NOTE — ED Triage Notes (Signed)
Pt states that she has had a sore throat and cough but tonight she started feeling like she was having palpitations. Alert and oriented.

## 2016-09-08 NOTE — ED Provider Notes (Signed)
[facility] DEPT Provider Note   CSN: [id number] Arrival date & time: 09/08/16  0018  By signing my name below, I, Gwenlyn Fudge, attest that this documentation has been prepared under the direction and in the presence of Varney Biles, MD. Electronically Signed: Gwenlyn Fudge, ED Scribe. 09/08/16. 2:04 AM.   History   Chief Complaint Chief Complaint  Patient presents with  . URI  . Palpitations   The history is provided by the patient. No language interpreter was used.   HPI Comments: Catharine Itkin is a 60 y.o. female with PMHx of DM, Diverticulitis, GERD, HTN, Asthma and IBS who presents to the Emergency Department complaining of gradual onset, constant sore throat for 5 days PTA. Pt reports associated dry cough, central chest tightness, and shortness of breath. She used 2 breathing treatments yesterday. Pt's son was diagnosed recently with URI and she became sick after son. Denies smoking. No hx of MI.  She also reports experiencing heart palpitations tonight. Pt has hx of palpitations. Unaware of any exacerbating or relieving factors.  Past Medical History:  Diagnosis Date  . Diabetes mellitus without complication ([place])   . Diverticulitis   . GERD (gastroesophageal reflux disease)   . Hypertension   . IBS (irritable bowel syndrome)   . TMJ (dislocation of temporomandibular joint)     There are no active problems to display for this patient.   Past Surgical History:  Procedure Laterality Date  . ABDOMINAL HYSTERECTOMY    . ESOPHAGOGASTRODUODENOSCOPY ENDOSCOPY     with esophagus being stretched twice per pt,   . EYE SURGERY    . NASAL SEPTUM SURGERY    . TONSILLECTOMY      OB History    No data available       Home Medications    Prior to Admission medications   Medication Sig Start Date End Date Taking? Authorizing Provider  acyclovir (ZOVIRAX) 800 MG tablet Take 800 mg by mouth 2 (two) times daily.   Yes Historical Provider, MD  albuterol (PROVENTIL  HFA;VENTOLIN HFA) 108 (90 BASE) MCG/ACT inhaler Inhale 2 puffs into the lungs every 6 (six) hours as needed for wheezing or shortness of breath.   Yes Historical Provider, MD  albuterol (PROVENTIL) (2.5 MG/3ML) 0.083% nebulizer solution Take 2.5 mg by nebulization every 6 (six) hours as needed for wheezing or shortness of breath.   Yes Historical Provider, MD  cyclobenzaprine (FLEXERIL) 10 MG tablet Take 10 mg by mouth 3 (three) times daily as needed for muscle spasms.   Yes Historical Provider, MD  dicyclomine (BENTYL) 10 MG capsule Take 10 mg by mouth 3 (three) times daily as needed for spasms.   Yes Historical Provider, MD  EPINEPHrine 0.3 mg/0.3 mL IJ SOAJ injection Inject into the muscle once.   Yes Historical Provider, MD  Fluticasone-Salmeterol (ADVAIR) 250-50 MCG/DOSE AEPB Inhale 1 puff into the lungs 2 (two) times daily.   Yes Historical Provider, MD  metoprolol tartrate (LOPRESSOR) 25 MG tablet Take 25 mg by mouth 2 (two) times daily.   Yes Historical Provider, MD  montelukast (SINGULAIR) 10 MG tablet Take 10 mg by mouth at bedtime.   Yes Historical Provider, MD  omeprazole (PRILOSEC) 40 MG capsule Take 40 mg by mouth daily.   Yes Historical Provider, MD  sitaGLIPtin-metformin (JANUMET) 50-500 MG tablet Take 1 tablet by mouth daily.   Yes Historical Provider, MD  sucralfate (CARAFATE) 1 GM/10ML suspension Take 10 mLs (1 g total) by mouth 4 (four) times daily -  with  meals and at bedtime. 09/01/16  Yes Amy S Esterwood, PA-C  predniSONE (DELTASONE) 10 MG tablet Take 6 tablets (60 mg total) by mouth daily. 09/08/16   Varney Biles, MD    Family History History reviewed. No pertinent family history.  Social History Social History  Substance Use Topics  . Smoking status: Never Smoker  . Smokeless tobacco: Never Used  . Alcohol use No     Allergies   Erythromycin; Levaquin [levofloxacin in d5w]; Morphine and related; and Sulfa antibiotics   Review of Systems Review of Systems A  complete 10 system review of systems was obtained and all systems are negative except as noted in the HPI and PMH.    Physical Exam Updated Vital Signs BP 125/80 (BP Location: Right Arm)   Pulse 92   Temp 98.4 F (36.9 C) (Oral)   Resp 16   Ht 5\' 7"  (1.702 m)   Wt 207 lb (93.9 kg)   SpO2 97%   BMI 32.42 kg/m   Physical Exam  Constitutional: She is oriented to person, place, and time. She appears well-developed and well-nourished.  HENT:  Head: Normocephalic.  Posterior pharynx is clear Positive erythema  Eyes: EOM are normal.  Neck: Normal range of motion.  Positive cervical lymphadenopathy  Cardiovascular: Normal rate, regular rhythm and normal heart sounds.   Pulses:      Radial pulses are 2+ on the right side, and 2+ on the left side.  Pulmonary/Chest: Effort normal.  Lungs clear to ausculation bilaterally  Abdominal: She exhibits no distension.  Musculoskeletal: Normal range of motion.  Neurological: She is alert and oriented to person, place, and time.  Skin:  No pitting edema No unilateral leg swelling  Psychiatric: She has a normal mood and affect.  Nursing note and vitals reviewed.    ED Treatments / Results  DIAGNOSTIC STUDIES: Oxygen Saturation is 97% on RA, adequate by my interpretation.    COORDINATION OF CARE: 1:59 AM Discussed treatment plan with pt at bedside which includes DG Chest and pt agreed to plan.  Labs (all labs ordered are listed, but only abnormal results are displayed) Labs Reviewed  RAPID STREP SCREEN (NOT AT [facility])  CULTURE, GROUP A STREP [facility])    EKG  EKG Interpretation  Date/Time:  Thursday September 08 2016 00:37:54 EDT Ventricular Rate:  92 PR Interval:    QRS Duration: 96 QT Interval:  347 QTC Calculation: 430 R Axis:   8 Text Interpretation:  Sinus rhythm No acute changes No significant change since last tracing Confirmed by Kathrynn Humble, MD, Thelma Comp [phone]) on 09/08/2016 3:00:17 AM       Radiology Dg Chest 2  View  Result Date: 09/08/2016 CLINICAL DATA:  Cough, shortness of breath, pleuritic chest pain for 5 days. History of diabetes and hypertension. Nonsmoker. EXAM: CHEST  2 VIEW COMPARISON:  05/24/2016 FINDINGS: Normal heart size and pulmonary vascularity. No focal airspace disease or consolidation in the lungs. No blunting of costophrenic angles. No pneumothorax. Mediastinal contours appear intact. Postoperative changes in the cervical spine. Mild degenerative changes in the thoracic spine. IMPRESSION: No active cardiopulmonary disease. Electronically Signed   By: Lucienne Capers M.D.   On: 09/08/2016 03:48   Ct Abdomen Pelvis W Contrast  Result Date: 09/06/2016 CLINICAL DATA:  Epigastric abdominal pain for several months. EXAM: CT ABDOMEN AND PELVIS WITH CONTRAST TECHNIQUE: Multidetector CT imaging of the abdomen and pelvis was performed using the standard protocol following bolus administration of intravenous contrast. CONTRAST:  174mL ISOVUE-300 IOPAMIDOL (ISOVUE-300)  INJECTION 61% COMPARISON:  None. FINDINGS: Lower chest: Visualized lung bases are unremarkable. Hepatobiliary: No gallstones are noted. Right hepatic cyst is noted. No other abnormality seen in the liver. Pancreas: Normal. Spleen: Normal. Adrenals/Urinary Tract: Adrenal glands and kidneys appear normal. No hydronephrosis or renal obstruction is noted. Urinary bladder appears normal. Stomach/Bowel: The appendix appears normal. There is no evidence of bowel obstruction. Vascular/Lymphatic: No significant adenopathy is noted. Reproductive: Status post hysterectomy.  Ovaries are unremarkable. Other: No abnormal fluid collection is noted. Musculoskeletal: No significant osseous abnormality is noted. IMPRESSION: No significant abnormality seen in the abdomen or pelvis. Electronically Signed   By: Marijo Conception, M.D.   On: 09/06/2016 12:01    Procedures Procedures (including critical care time)  Medications Ordered in ED Medications   predniSONE (DELTASONE) tablet 60 mg (60 mg Oral Given 09/08/16 0259)     Initial Impression / Assessment and Plan / ED Course  I have reviewed the triage vital signs and the nursing notes.  Pertinent labs & imaging results that were available during my care of the patient were reviewed by me and considered in my medical decision making (see chart for details).  Clinical Course   I personally performed the services described in this documentation, which was scribed in my presence. The recorded information has been reviewed and is accurate.  Pt comes in with cc of wheezing, dib. Pt is having uti like symptoms as well. Lung exam is benign, CXR is clear. Rapid strep is neg. Suspect viral uri, especially since she has exposures. D/c with strict ER return precautions. She will see her pcp in 5 days.  Final Clinical Impressions(s) / ED Diagnoses   Final diagnoses:  Viral URI with cough    New Prescriptions Discharge Medication List as of 09/08/2016  4:28 AM    START taking these medications   Details  predniSONE (DELTASONE) 10 MG tablet Take 6 tablets (60 mg total) by mouth daily., Starting Thu 09/08/2016, Print         Varney Biles, MD 09/08/16 [phone]

## 2016-09-09 ENCOUNTER — Ambulatory Visit (HOSPITAL_COMMUNITY)
Admission: RE | Admit: 2016-09-09 | Discharge: 2016-09-09 | Disposition: A | Discharge status: Home / Self Care | Payer: Medicare Other | Source: Ambulatory Visit | Attending: Physician Assistant | Admitting: Physician Assistant

## 2016-09-09 DIAGNOSIS — R1310 Dysphagia, unspecified: Secondary | ICD-10-CM | POA: Insufficient documentation

## 2016-09-09 DIAGNOSIS — K228 Other specified diseases of esophagus: Secondary | ICD-10-CM | POA: Diagnosis not present

## 2016-09-09 DIAGNOSIS — R1013 Epigastric pain: Secondary | ICD-10-CM | POA: Diagnosis present

## 2016-09-09 DIAGNOSIS — R1084 Generalized abdominal pain: Secondary | ICD-10-CM

## 2016-09-10 LAB — CULTURE, GROUP A STREP (THRC)

## 2016-09-12 ENCOUNTER — Other Ambulatory Visit: Payer: Self-pay

## 2016-09-12 MED ORDER — SUCRALFATE 1 G PO TABS: 1.0000 g | ORAL_TABLET | Freq: Three times a day (TID) | ORAL | 1 refills | Status: DC

## 2016-09-12 NOTE — Telephone Encounter (Signed)
  Pharmacy request carafate tablets in place of liquid due to cost.  Ok with Nicoletta Ba, PA-C

## 2016-09-14 ENCOUNTER — Telehealth: Payer: Self-pay | Admitting: Physician Assistant

## 2016-09-15 NOTE — Telephone Encounter (Signed)
Liquid was too expensive. Pharmacy will change back to tablets. Patient will have a co-pay of $1.20, but she will need to go to [employer] at [employer].

## 2016-10-21 ENCOUNTER — Other Ambulatory Visit: Payer: Self-pay | Admitting: *Deleted

## 2016-10-21 ENCOUNTER — Encounter: Payer: Self-pay | Admitting: Gastroenterology

## 2016-10-21 ENCOUNTER — Ambulatory Visit (INDEPENDENT_AMBULATORY_CARE_PROVIDER_SITE_OTHER): Payer: Medicare Other | Admitting: Gastroenterology

## 2016-10-21 VITALS — BP 118/68 | HR 90 | Ht 67.0 in | Wt 205.5 lb

## 2016-10-21 DIAGNOSIS — R1013 Epigastric pain: Secondary | ICD-10-CM

## 2016-10-21 DIAGNOSIS — K589 Irritable bowel syndrome without diarrhea: Secondary | ICD-10-CM | POA: Diagnosis not present

## 2016-10-21 DIAGNOSIS — R0789 Other chest pain: Secondary | ICD-10-CM | POA: Diagnosis not present

## 2016-10-21 DIAGNOSIS — R1310 Dysphagia, unspecified: Secondary | ICD-10-CM

## 2016-10-21 MED ORDER — DICYCLOMINE HCL 10 MG PO CAPS: 10.0000 mg | ORAL_CAPSULE | Freq: Three times a day (TID) | ORAL | 3 refills | Status: DC | PRN

## 2016-10-21 NOTE — Patient Instructions (Signed)
Increase  your omeprazole twice a day   Use Phazyme 1 capsule  Three times a day as needed  We will send Bentyl 10 mg every 6 hours as needed  Follow up as needed

## 2016-10-21 NOTE — Progress Notes (Signed)
Pamela Roberson    [id number]    [birth year]  Primary [facility]  Referring Physician: [facility] [address] [place], [place] [address]  Chief complaint:  Epigastric pain, heartburn, constipation  HPI:  60 year old African-American female, here for follow-up visit. She was last seen on 09/01/2016 by Nicoletta Ba for evaluation of abdominal pain and dysphagia.  She had CT abdomen and pelvis with contrast did not show any significant abnormality and barium esophagram was unremarkable. Patient complains of severe pressure in the epigastric region and chest tightness, thinks she has a hernia there and would like to have that repaired or fixed. No evidence of hiatal hernia based on its CT abdomen and pelvis or barium esophagram. He was referred to cardiology by her primary care physician and is undergoing evaluation for shortness of breath and palpitations. She is currently taking PPI once a day and reports having heartburn breakthrough almost every day worse in the evening. She continues to have some difficulty swallowing but has gained weight in past 6 months. She has history of chronic constipation, has tried MiraLAX, Amitiza and Linzess, per patient neither of them worked and gave her palpitations. She is reluctant to try any new medication. Currently she is having 3-4 bowel movements a week with magnesium citrate as needed Denies any nausea, vomiting, diarrhea, melena or blood per rectum.  Records not available but per patient she had colonoscopy in 2008 with findings of diverticulosis and ? Colon polyp that was benign and not precancerous  Outpatient Encounter Prescriptions as of 10/21/2016  Medication Sig  . acyclovir (ZOVIRAX) 800 MG tablet Take 800 mg by mouth 2 (two) times daily.  Marland Kitchen albuterol (PROVENTIL HFA;VENTOLIN HFA) 108 (90 BASE) MCG/ACT inhaler Inhale 2 puffs into the lungs every 6 (six) hours as needed for wheezing or  shortness of breath.  Marland Kitchen albuterol (PROVENTIL) (2.5 MG/3ML) 0.083% nebulizer solution Take 2.5 mg by nebulization every 6 (six) hours as needed for wheezing or shortness of breath.  . cyclobenzaprine (FLEXERIL) 10 MG tablet Take 10 mg by mouth 3 (three) times daily as needed for muscle spasms.  Marland Kitchen dicyclomine (BENTYL) 10 MG capsule Take 10 mg by mouth 3 (three) times daily as needed for spasms.  Marland Kitchen EPINEPHrine 0.3 mg/0.3 mL IJ SOAJ injection Inject into the muscle once.  . Fluticasone-Salmeterol (ADVAIR) 250-50 MCG/DOSE AEPB Inhale 1 puff into the lungs 2 (two) times daily.  . metoprolol tartrate (LOPRESSOR) 25 MG tablet Take 25 mg by mouth 2 (two) times daily.  . montelukast (SINGULAIR) 10 MG tablet Take 10 mg by mouth at bedtime.  Marland Kitchen omeprazole (PRILOSEC) 40 MG capsule Take 40 mg by mouth daily.  . sitaGLIPtin-metformin (JANUMET) 50-500 MG tablet Take 1 tablet by mouth daily.  . sucralfate (CARAFATE) 1 g tablet Take 1 tablet (1 g total) by mouth 4 (four) times daily -  with meals and at bedtime.  . [DISCONTINUED] predniSONE (DELTASONE) 10 MG tablet Take 6 tablets (60 mg total) by mouth daily.   No facility-administered encounter medications on file as of 10/21/2016.     Allergies as of 10/21/2016 - Review Complete 10/21/2016  Allergen Reaction Noted  . Erythromycin Swelling and Rash 07/27/2014  . Prednisone Palpitations 10/21/2016  . Levaquin [levofloxacin in d5w] Other (See Comments) 01/13/2016  . Morphine and related Nausea And Vomiting and Rash 07/27/2014  . Sulfa antibiotics Rash 07/27/2014    Past Medical History:  Diagnosis Date  .  Diabetes mellitus without complication ([place])   . Diverticulitis   . GERD (gastroesophageal reflux disease)   . Hypertension   . IBS (irritable bowel syndrome)   . TMJ (dislocation of temporomandibular joint)     Past Surgical History:  Procedure Laterality Date  . ABDOMINAL HYSTERECTOMY    . ESOPHAGOGASTRODUODENOSCOPY ENDOSCOPY     with esophagus  being stretched twice per pt,   . EYE SURGERY    . NASAL SEPTUM SURGERY    . TONSILLECTOMY      History reviewed. No pertinent family history.  Social History   Social History  . Marital status: Widowed    Spouse name: N/A  . Number of children: 3  . Years of education: N/A   Occupational History  . Not on file.   Social History Main Topics  . Smoking status: Never Smoker  . Smokeless tobacco: Never Used  . Alcohol use No  . Drug use: No  . Sexual activity: Not on file   Other Topics Concern  . Not on file   Social History Narrative  . No narrative on file      Review of systems: Review of Systems  Constitutional: Negative for fever and chills.  HENT: Negative.   Eyes: Negative for blurred vision.  Respiratory: Positive for cough, shortness of breath and wheezing.   Cardiovascular: Positive for chest pain and palpitations.  Gastrointestinal: as per HPI Genitourinary: Negative for dysuria, urgency, frequency and hematuria.  Musculoskeletal: Positive for myalgias, back pain and joint pain.  Skin: Negative for itching and rash.  Neurological: Negative for dizziness, tremors, focal weakness, seizures and loss of consciousness.  Endo/Heme/Allergies: Positive for seasonal allergies.  Psychiatric/Behavioral: Negative for depression, suicidal ideas and hallucinations.  All other systems reviewed and are negative.   Physical Exam: Vitals:   10/21/16 1456  BP: 118/68  Pulse: 90   Body mass index is 32.19 kg/m. Gen:      No acute distress HEENT:  EOMI, sclera anicteric Neck:     No masses; no thyromegaly Lungs:    Clear to auscultation bilaterally; normal respiratory effort CV:         Regular rate and rhythm; no murmurs Abd:      + bowel sounds; soft, non-tender; no palpable masses, no distension Ext:    No edema; adequate peripheral perfusion Skin:      Warm and dry; no rash Neuro: alert and oriented x 3 Psych: normal mood and affect  Data  Reviewed:  Reviewed labs, radiology imaging, old records and pertinent past GI work up I independently visualized the images of barium esophagram and CT abdomen and pelvis and reviewed the reports  Assessment and Plan/Recommendations:  60 year old African-American female with 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 Patient does appear to have functional component  Trial of high-dose PPI twice a day 30 minutes before breakfast and dinner for 2 months and if  no improvement can consider discontinuing it Phazyme 1 capsule 3 times a day as needed Continue Bentyl as needed for intermittent abdominal cramping Chronic constipation: The patient is unwilling to try any medication at this point Increase dietary fiber and fluid intake  We'll need to obtain her prior colonoscopy report to determine surveillance colonoscopy, based on patient history she is likely due for recall colonoscopy in 2018 She will need follow up with PMD and cardiology for evaluation of palpitations and shortness of breath  25 minutes was spent face-to-face with the patient. Greater than 50% of the time used for counseling as well as treatment plan and follow-up. She had multiple questions which were answered to her satisfaction  K. Denzil Magnuson , MD [phone] Mon-Fri 8a-5p [phone] after 5p,  weekends, holidays  CC: [facility]

## 2016-11-16 ENCOUNTER — Telehealth: Payer: Self-pay | Admitting: Gastroenterology

## 2016-11-16 NOTE — Telephone Encounter (Signed)
Spoke with the patient. She needs the new Rx for twice a day high dose PPI. She is on Omeprazole 40 mg, but she did not understand to double up on the dosage with what she already had. Therefore she has continued once a day dosing.  Okay to give her a new Rx? She also states she is ready to try medication for her chronic constipation. She states she has tried and failed Amitiza and Linzess. Presently uses [facility].

## 2016-11-17 ENCOUNTER — Other Ambulatory Visit: Payer: Self-pay

## 2016-11-17 MED ORDER — PLECANATIDE 3 MG PO TABS: 3.0000 mg | ORAL_TABLET | Freq: Every day | ORAL | 1 refills | Status: DC

## 2016-11-17 MED ORDER — OMEPRAZOLE 40 MG PO CPDR: 40.0000 mg | DELAYED_RELEASE_CAPSULE | Freq: Two times a day (BID) | ORAL | 2 refills | Status: DC

## 2016-11-17 NOTE — Telephone Encounter (Signed)
Patient is advised.  

## 2016-11-17 NOTE — Telephone Encounter (Signed)
Ok to send Rx for PPI BID. Please advise patient to start Plecanatide (Trulance) 3 mg daily

## 2017-01-16 ENCOUNTER — Telehealth: Payer: Self-pay | Admitting: Gastroenterology

## 2017-01-16 MED ORDER — SUCRALFATE 1 G PO TABS: 1.0000 g | ORAL_TABLET | Freq: Three times a day (TID) | ORAL | 1 refills | Status: DC

## 2017-01-16 NOTE — Telephone Encounter (Signed)
Called patient to inform med sent to pharmacy  

## 2017-02-15 ENCOUNTER — Other Ambulatory Visit: Payer: Self-pay | Admitting: Internal Medicine

## 2017-02-15 DIAGNOSIS — Z1231 Encounter for screening mammogram for malignant neoplasm of breast: Secondary | ICD-10-CM

## 2017-03-07 ENCOUNTER — Ambulatory Visit
Admission: RE | Admit: 2017-03-07 | Discharge: 2017-03-07 | Disposition: A | Discharge status: Home / Self Care | Payer: Medicare Other | Source: Ambulatory Visit | Attending: Internal Medicine | Admitting: Internal Medicine

## 2017-03-07 DIAGNOSIS — Z1231 Encounter for screening mammogram for malignant neoplasm of breast: Secondary | ICD-10-CM

## 2017-03-27 NOTE — Progress Notes (Signed)
Please place orders in EPIC as patient is being scheduled for a Pre-op appointment! Thank you! 

## 2017-03-28 NOTE — Patient Instructions (Signed)
Pamela Roberson  03/28/2017   Your procedure is scheduled on: 04/10/2017     Report to [facility] Main  Entrance and take [place] elevators to 3rd floor to [place] at 1030am.       Call this number if you have problems the morning of surgery [phone]    Remember: ONLY 1 PERSON MAY GO WITH YOU TO SHORT STAY TO GET  READY MORNING OF [place].  Do not eat food or drink liquids :After Midnight.              Eat a good healthy snack prior to bedtime.      Take these medicines the morning of surgery with A SIP OF WATER: albuterol Inhaler if needed and bring, Nebulizer if needed, Advair and bring, gabapentin ( Neurontin), Claritinn, Metoprolol ( Lopressor), Prilosec  DO NOT TAKE ANY DIABETIC MEDICATIONS DAY OF YOUR SURGERY                               You may not have any metal on your body including hair pins and              piercings  Do not wear jewelry, make-up, lotions, powders or perfumes, deodorant             Do not wear nail polish.  Do not shave  48 hours prior to surgery.     Do not bring valuables to the hospital. [place].  Contacts, dentures or bridgework may not be worn into surgery.  Leave suitcase in the car. After surgery it may be brought to your room.                      Please read over the following fact sheets you were given: _____________________________________________________________________             [facility] - Preparing for Surgery Before surgery, you can play an important role.  Because skin is not sterile, your skin needs to be as free of germs as possible.  You can reduce the number of germs on your skin by washing with CHG (chlorahexidine gluconate) soap before surgery.  CHG is an antiseptic cleaner which kills germs and bonds with the skin to continue killing germs even after washing. Please DO NOT use if you have an allergy to CHG or antibacterial  soaps.  If your skin becomes reddened/irritated stop using the CHG and inform your nurse when you arrive at Short Stay. Do not shave (including legs and underarms) for at least 48 hours prior to the first CHG shower.  You may shave your face/neck. Please follow these instructions carefully:  1.  Shower with CHG Soap the night before surgery and the  morning of Surgery.  2.  If you choose to wash your hair, wash your hair first as usual with your  normal  shampoo.  3.  After you shampoo, rinse your hair and body thoroughly to remove the  shampoo.                           4.  Use CHG as you would any other liquid soap.  You can apply  chg directly  to the skin and wash                       Gently with a scrungie or clean washcloth.  5.  Apply the CHG Soap to your body ONLY FROM THE NECK DOWN.   Do not use on face/ open                           Wound or open sores. Avoid contact with eyes, ears mouth and genitals (private parts).                       Wash face,  Genitals (private parts) with your normal soap.             6.  Wash thoroughly, paying special attention to the area where your surgery  will be performed.  7.  Thoroughly rinse your body with warm water from the neck down.  8.  DO NOT shower/wash with your normal soap after using and rinsing off  the CHG Soap.                9.  Pat yourself dry with a clean towel.            10.  Wear clean pajamas.            11.  Place clean sheets on your bed the night of your first shower and do not  sleep with pets. Day of Surgery : Do not apply any lotions/deodorants the morning of surgery.  Please wear clean clothes to the hospital/surgery center.  FAILURE TO FOLLOW THESE INSTRUCTIONS MAY RESULT IN THE CANCELLATION OF YOUR SURGERY PATIENT SIGNATURE_________________________________  NURSE SIGNATURE__________________________________  ________________________________________________________________________ How to Manage Your Diabetes Before  and After Surgery  Why is it important to control my blood sugar before and after surgery? . Improving blood sugar levels before and after surgery helps healing and can limit problems. . A way of improving blood sugar control is eating a healthy diet by: o  Eating less sugar and carbohydrates o  Increasing activity/exercise o  Talking with your doctor about reaching your blood sugar goals . High blood sugars (greater than 180 mg/dL) can raise your risk of infections and slow your recovery, so you will need to focus on controlling your diabetes during the weeks before surgery. . Make sure that the doctor who takes care of your diabetes knows about your planned surgery including the date and location.  How do I manage my blood sugar before surgery? . Check your blood sugar at least 4 times a day, starting 2 days before surgery, to make sure that the level is not too high or low. o Check your blood sugar the morning of your surgery when you wake up and every 2 hours until you get to the Short Stay unit. . If your blood sugar is less than 70 mg/dL, you will need to treat for low blood sugar: o Do not take insulin. o Treat a low blood sugar (less than 70 mg/dL) with  cup of clear juice (cranberry or apple), 4 glucose tablets, OR glucose gel. o Recheck blood sugar in 15 minutes after treatment (to make sure it is greater than 70 mg/dL). If your blood sugar is not greater than 70 mg/dL on recheck, call [phone] for further instructions. . Report your blood sugar to the short stay nurse when you  get to Short Stay.  . If you are admitted to the hospital after surgery: o Your blood sugar will be checked by the staff and you will probably be given insulin after surgery (instead of oral diabetes medicines) to make sure you have good blood sugar levels. o The goal for blood sugar control after surgery is 80-180 mg/dL.   WHAT DO I DO ABOUT MY DIABETES MEDICATION?  Marland Kitchen Do not take oral diabetes  medicines (pills) the morning of surgery.  .    Patient Signature:  Date:   Nurse Signature:  Date:   Reviewed and Endorsed by [facility] Patient Education Committee, August 2015

## 2017-03-30 ENCOUNTER — Encounter (HOSPITAL_COMMUNITY)
Admission: RE | Admit: 2017-03-30 | Discharge: 2017-03-30 | Disposition: A | Discharge status: Home / Self Care | Payer: Medicare Other | Source: Ambulatory Visit | Attending: Orthopedic Surgery | Admitting: Orthopedic Surgery

## 2017-03-30 ENCOUNTER — Encounter (HOSPITAL_COMMUNITY): Payer: Self-pay

## 2017-03-30 ENCOUNTER — Ambulatory Visit (HOSPITAL_COMMUNITY)
Admission: RE | Admit: 2017-03-30 | Discharge: 2017-03-30 | Disposition: A | Discharge status: Home / Self Care | Payer: Medicare Other | Source: Ambulatory Visit | Attending: Anesthesiology | Admitting: Anesthesiology

## 2017-03-30 DIAGNOSIS — Z01818 Encounter for other preprocedural examination: Secondary | ICD-10-CM

## 2017-03-30 DIAGNOSIS — M25811 Other specified joint disorders, right shoulder: Secondary | ICD-10-CM | POA: Insufficient documentation

## 2017-03-30 DIAGNOSIS — E119 Type 2 diabetes mellitus without complications: Secondary | ICD-10-CM | POA: Diagnosis not present

## 2017-03-30 DIAGNOSIS — I10 Essential (primary) hypertension: Secondary | ICD-10-CM | POA: Insufficient documentation

## 2017-03-30 DIAGNOSIS — M19011 Primary osteoarthritis, right shoulder: Secondary | ICD-10-CM | POA: Insufficient documentation

## 2017-03-30 DIAGNOSIS — K219 Gastro-esophageal reflux disease without esophagitis: Secondary | ICD-10-CM | POA: Insufficient documentation

## 2017-03-30 DIAGNOSIS — G4730 Sleep apnea, unspecified: Secondary | ICD-10-CM | POA: Insufficient documentation

## 2017-03-30 DIAGNOSIS — Z01812 Encounter for preprocedural laboratory examination: Secondary | ICD-10-CM | POA: Diagnosis present

## 2017-03-30 HISTORY — DX: Sleep apnea, unspecified: G47.30

## 2017-03-30 HISTORY — DX: Personal history of other diseases of the respiratory system: Z87.09

## 2017-03-30 HISTORY — DX: Dyspnea, unspecified: R06.00

## 2017-03-30 HISTORY — DX: Palpitations: R00.2

## 2017-03-30 HISTORY — DX: Unspecified osteoarthritis, unspecified site: M19.90

## 2017-03-30 HISTORY — DX: Respiratory tuberculosis unspecified: A15.9

## 2017-03-30 LAB — BASIC METABOLIC PANEL
Anion gap: 9 (ref 5–15)
BUN: 8 mg/dL (ref 6–20)
CO2: 26 mmol/L (ref 22–32)
Calcium: 9.4 mg/dL (ref 8.9–10.3)
Chloride: 106 mmol/L (ref 101–111)
Creatinine, Ser: 0.96 mg/dL (ref 0.44–1.00)
GFR calc Af Amer: >60 mL/min (ref >60)
GFR calc non Af Amer: >60 mL/min (ref >60)
Glucose, Bld: 106 mg/dL — ABNORMAL HIGH (ref 65–99)
Potassium: 4.5 mmol/L (ref 3.5–5.1)
Sodium: 141 mmol/L (ref 135–145)

## 2017-03-30 LAB — CBC
HCT: 42.9 % (ref 36.0–46.0)
Hemoglobin: 13.8 g/dL (ref 12.0–15.0)
MCH: 28.6 pg (ref 26.0–34.0)
MCHC: 32.2 g/dL (ref 30.0–36.0)
MCV: 88.8 fL (ref 78.0–100.0)
Platelets: 224 10*3/uL (ref 150–400)
RBC: 4.83 MIL/uL (ref 3.87–5.11)
RDW: 14.2 % (ref 11.5–15.5)
WBC: 5 10*3/uL (ref 4.0–10.5)

## 2017-03-30 LAB — GLUCOSE, CAPILLARY: Glucose-Capillary: 90 mg/dL (ref 65–99)

## 2017-03-30 NOTE — Progress Notes (Signed)
Patient reported at preop appt some sinus drainage with slight cough.  CXR done 03/30/2017- epic Instructed patient to have PCP evaluate sinus issues prior to surgery and to let them be aware she is having surgery.  Patient voiced understanding.

## 2017-03-30 NOTE — Progress Notes (Signed)
03/23/17- Dr Britta Mccreedy -  - LOV - on chart  09/08/16- EKG- epic

## 2017-03-31 LAB — HEMOGLOBIN A1C
Hgb A1c MFr Bld: 5.9 % — ABNORMAL HIGH (ref 4.8–5.6)
Mean Plasma Glucose: 123 mg/dL

## 2017-04-05 ENCOUNTER — Ambulatory Visit (HOSPITAL_COMMUNITY): Payer: Medicare Other | Admitting: Anesthesiology

## 2017-04-05 ENCOUNTER — Encounter (HOSPITAL_COMMUNITY)
Admission: RE | Disposition: A | Discharge status: Home / Self Care | Payer: Self-pay | Source: Ambulatory Visit | Attending: Orthopedic Surgery

## 2017-04-05 ENCOUNTER — Encounter (HOSPITAL_COMMUNITY): Payer: Self-pay | Admitting: Anesthesiology

## 2017-04-05 ENCOUNTER — Ambulatory Visit (HOSPITAL_COMMUNITY)
Admission: RE | Admit: 2017-04-05 | Discharge: 2017-04-05 | Disposition: A | Discharge status: Home / Self Care | Payer: Medicare Other | Source: Ambulatory Visit | Attending: Orthopedic Surgery | Admitting: Orthopedic Surgery

## 2017-04-05 DIAGNOSIS — M26609 Unspecified temporomandibular joint disorder, unspecified side: Secondary | ICD-10-CM | POA: Diagnosis not present

## 2017-04-05 DIAGNOSIS — M19011 Primary osteoarthritis, right shoulder: Secondary | ICD-10-CM | POA: Diagnosis not present

## 2017-04-05 DIAGNOSIS — Z888 Allergy status to other drugs, medicaments and biological substances status: Secondary | ICD-10-CM | POA: Diagnosis not present

## 2017-04-05 DIAGNOSIS — R0600 Dyspnea, unspecified: Secondary | ICD-10-CM | POA: Diagnosis not present

## 2017-04-05 DIAGNOSIS — Z882 Allergy status to sulfonamides status: Secondary | ICD-10-CM | POA: Diagnosis not present

## 2017-04-05 DIAGNOSIS — R7611 Nonspecific reaction to tuberculin skin test without active tuberculosis: Secondary | ICD-10-CM | POA: Diagnosis not present

## 2017-04-05 DIAGNOSIS — Z90710 Acquired absence of both cervix and uterus: Secondary | ICD-10-CM | POA: Diagnosis not present

## 2017-04-05 DIAGNOSIS — I10 Essential (primary) hypertension: Secondary | ICD-10-CM | POA: Insufficient documentation

## 2017-04-05 DIAGNOSIS — K219 Gastro-esophageal reflux disease without esophagitis: Secondary | ICD-10-CM | POA: Diagnosis not present

## 2017-04-05 DIAGNOSIS — Z79899 Other long term (current) drug therapy: Secondary | ICD-10-CM | POA: Insufficient documentation

## 2017-04-05 DIAGNOSIS — K5792 Diverticulitis of intestine, part unspecified, without perforation or abscess without bleeding: Secondary | ICD-10-CM | POA: Diagnosis not present

## 2017-04-05 DIAGNOSIS — Z91013 Allergy to seafood: Secondary | ICD-10-CM | POA: Insufficient documentation

## 2017-04-05 DIAGNOSIS — Z87891 Personal history of nicotine dependence: Secondary | ICD-10-CM | POA: Diagnosis not present

## 2017-04-05 DIAGNOSIS — Z7984 Long term (current) use of oral hypoglycemic drugs: Secondary | ICD-10-CM | POA: Insufficient documentation

## 2017-04-05 DIAGNOSIS — M7521 Bicipital tendinitis, right shoulder: Secondary | ICD-10-CM | POA: Diagnosis not present

## 2017-04-05 DIAGNOSIS — E119 Type 2 diabetes mellitus without complications: Secondary | ICD-10-CM | POA: Insufficient documentation

## 2017-04-05 DIAGNOSIS — K589 Irritable bowel syndrome without diarrhea: Secondary | ICD-10-CM | POA: Diagnosis not present

## 2017-04-05 DIAGNOSIS — G4730 Sleep apnea, unspecified: Secondary | ICD-10-CM | POA: Diagnosis not present

## 2017-04-05 DIAGNOSIS — Z885 Allergy status to narcotic agent status: Secondary | ICD-10-CM | POA: Diagnosis not present

## 2017-04-05 DIAGNOSIS — R002 Palpitations: Secondary | ICD-10-CM | POA: Insufficient documentation

## 2017-04-05 DIAGNOSIS — M7541 Impingement syndrome of right shoulder: Secondary | ICD-10-CM | POA: Diagnosis present

## 2017-04-05 DIAGNOSIS — M75111 Incomplete rotator cuff tear or rupture of right shoulder, not specified as traumatic: Secondary | ICD-10-CM | POA: Diagnosis not present

## 2017-04-05 LAB — GLUCOSE, CAPILLARY
Glucose-Capillary: 102 mg/dL — ABNORMAL HIGH (ref 65–99)
Glucose-Capillary: 81 mg/dL (ref 65–99)

## 2017-04-05 SURGERY — SHOULDER ARTHROSCOPY WITH SUBACROMIAL DECOMPRESSION AND DISTAL CLAVICLE EXCISION
Anesthesia: General | Site: Shoulder | Laterality: Right

## 2017-04-05 MED ORDER — LACTATED RINGERS IV SOLN
INTRAVENOUS | Status: DC
  Administered 2017-04-05 (×2): via INTRAVENOUS

## 2017-04-05 MED ORDER — MIDAZOLAM HCL 2 MG/2ML IJ SOLN
INTRAMUSCULAR | Status: AC
  Administered 2017-04-05: 1 mg
  Filled 2017-04-05: qty 2

## 2017-04-05 MED ORDER — FENTANYL CITRATE (PF) 100 MCG/2ML IJ SOLN
25.0000 ug | INTRAMUSCULAR | Status: DC | PRN
  Administered 2017-04-05 (×2): 50 ug via INTRAVENOUS

## 2017-04-05 MED ORDER — HYDROMORPHONE HCL 2 MG PO TABS: 2.0000 mg | ORAL_TABLET | ORAL | 0 refills | Status: DC | PRN

## 2017-04-05 MED ORDER — CEFAZOLIN SODIUM-DEXTROSE 2-4 GM/100ML-% IV SOLN
2.0000 g | INTRAVENOUS | Status: AC
  Administered 2017-04-05: 2 g via INTRAVENOUS
  Filled 2017-04-05: qty 100

## 2017-04-05 MED ORDER — CHLORHEXIDINE GLUCONATE 4 % EX LIQD: 60.0000 mL | Freq: Once | CUTANEOUS | Status: DC

## 2017-04-05 MED ORDER — ONDANSETRON HCL 4 MG/2ML IJ SOLN
INTRAMUSCULAR | Status: DC | PRN
  Administered 2017-04-05: 4 mg via INTRAVENOUS

## 2017-04-05 MED ORDER — MEPERIDINE HCL 50 MG/ML IJ SOLN: 6.2500 mg | INTRAMUSCULAR | Status: DC | PRN

## 2017-04-05 MED ORDER — DEXAMETHASONE SODIUM PHOSPHATE 10 MG/ML IJ SOLN
INTRAMUSCULAR | Status: AC
  Filled 2017-04-05: qty 1

## 2017-04-05 MED ORDER — FENTANYL CITRATE (PF) 100 MCG/2ML IJ SOLN
INTRAMUSCULAR | Status: AC
  Filled 2017-04-05: qty 2

## 2017-04-05 MED ORDER — DEXAMETHASONE SODIUM PHOSPHATE 10 MG/ML IJ SOLN
INTRAMUSCULAR | Status: DC | PRN
  Administered 2017-04-05: 5 mg via INTRAVENOUS

## 2017-04-05 MED ORDER — ROCURONIUM BROMIDE 50 MG/5ML IV SOSY
PREFILLED_SYRINGE | INTRAVENOUS | Status: AC
  Filled 2017-04-05: qty 5

## 2017-04-05 MED ORDER — LIDOCAINE 2% (20 MG/ML) 5 ML SYRINGE
INTRAMUSCULAR | Status: DC | PRN
  Administered 2017-04-05: 100 mg via INTRAVENOUS

## 2017-04-05 MED ORDER — 0.9 % SODIUM CHLORIDE (POUR BTL) OPTIME
TOPICAL | Status: DC | PRN
  Administered 2017-04-05: 1000 mL

## 2017-04-05 MED ORDER — PROMETHAZINE HCL 25 MG/ML IJ SOLN: 6.2500 mg | INTRAMUSCULAR | Status: DC | PRN

## 2017-04-05 MED ORDER — SODIUM CHLORIDE 0.9 % IR SOLN
Status: DC | PRN
  Administered 2017-04-05: 6000 mL

## 2017-04-05 MED ORDER — PROPOFOL 10 MG/ML IV BOLUS
INTRAVENOUS | Status: AC
  Filled 2017-04-05: qty 40

## 2017-04-05 MED ORDER — MIDAZOLAM HCL 2 MG/2ML IJ SOLN: 1.0000 mg | INTRAMUSCULAR | Status: DC | PRN

## 2017-04-05 MED ORDER — ONDANSETRON 4 MG PO TBDP: 4.0000 mg | ORAL_TABLET | Freq: Three times a day (TID) | ORAL | 0 refills | Status: DC | PRN

## 2017-04-05 MED ORDER — PROPOFOL 10 MG/ML IV BOLUS
INTRAVENOUS | Status: DC | PRN
  Administered 2017-04-05: 120 mg via INTRAVENOUS

## 2017-04-05 MED ORDER — FENTANYL CITRATE (PF) 100 MCG/2ML IJ SOLN
100.0000 ug | Freq: Once | INTRAMUSCULAR | Status: AC
  Administered 2017-04-05: 50 ug via INTRAVENOUS

## 2017-04-05 MED ORDER — ROCURONIUM BROMIDE 10 MG/ML (PF) SYRINGE
PREFILLED_SYRINGE | INTRAVENOUS | Status: DC | PRN
  Administered 2017-04-05: 10 mg via INTRAVENOUS
  Administered 2017-04-05: 40 mg via INTRAVENOUS

## 2017-04-05 MED ORDER — ROPIVACAINE HCL 5 MG/ML IJ SOLN
INTRAMUSCULAR | Status: DC | PRN
  Administered 2017-04-05: 30 mL via PERINEURAL

## 2017-04-05 MED ORDER — ONDANSETRON HCL 4 MG/2ML IJ SOLN
INTRAMUSCULAR | Status: AC
  Filled 2017-04-05: qty 2

## 2017-04-05 MED ORDER — SUGAMMADEX SODIUM 200 MG/2ML IV SOLN
INTRAVENOUS | Status: DC | PRN
  Administered 2017-04-05: 200 mg via INTRAVENOUS

## 2017-04-05 MED ORDER — ASPIRIN EC 325 MG PO TBEC: 325.0000 mg | DELAYED_RELEASE_TABLET | Freq: Every day | ORAL | 0 refills | Status: DC

## 2017-04-05 MED ORDER — LIDOCAINE 2% (20 MG/ML) 5 ML SYRINGE
INTRAMUSCULAR | Status: AC
  Filled 2017-04-05: qty 5

## 2017-04-05 MED ORDER — HYDROMORPHONE HCL 1 MG/ML IJ SOLN
0.2500 mg | INTRAMUSCULAR | Status: DC | PRN
  Filled 2017-04-05: qty 0.5

## 2017-04-05 SURGICAL SUPPLY — 36 items
BLADE 4.2CUDA (BLADE) ×2 IMPLANT
BLADE GREAT WHITE 4.2 (BLADE) ×2 IMPLANT
BLADE SURG SZ11 CARB STEEL (BLADE) ×2 IMPLANT
BOOTIES KNEE HIGH SLOAN (MISCELLANEOUS) ×4 IMPLANT
BUR OVAL 4.0 (BURR) ×2 IMPLANT
BUR VERTEX HOODED 4.5 (BURR) ×2 IMPLANT
COVER SURGICAL LIGHT HANDLE (MISCELLANEOUS) ×2 IMPLANT
DRAPE SHOULDER BEACH CHAIR (DRAPES) ×2 IMPLANT
DRAPE U-SHAPE 47X51 STRL (DRAPES) ×2 IMPLANT
DRSG ADAPTIC 3X8 NADH LF (GAUZE/BANDAGES/DRESSINGS) ×2 IMPLANT
DRSG PAD ABDOMINAL 8X10 ST (GAUZE/BANDAGES/DRESSINGS) IMPLANT
DURAPREP 26ML APPLICATOR (WOUND CARE) ×2 IMPLANT
GAUZE SPONGE 4X4 12PLY STRL (GAUZE/BANDAGES/DRESSINGS) ×2 IMPLANT
GLOVE BIOGEL M 7.0 STRL (GLOVE) IMPLANT
GLOVE BIOGEL PI IND STRL 7.5 (GLOVE) IMPLANT
GLOVE BIOGEL PI INDICATOR 7.5 (GLOVE)
GLOVE INDICATOR 8.0 STRL GRN (GLOVE) ×2 IMPLANT
GLOVE ORTHO TXT STRL SZ7.5 (GLOVE) ×4 IMPLANT
GLOVE SURG ORTHO 8.0 STRL STRW (GLOVE) ×4 IMPLANT
GOWN STRL REUS W/TWL LRG LVL3 (GOWN DISPOSABLE) ×2 IMPLANT
GOWN STRL REUS W/TWL XL LVL3 (GOWN DISPOSABLE) ×4 IMPLANT
KIT BASIN OR (CUSTOM PROCEDURE TRAY) ×4 IMPLANT
KIT POSITION SHOULDER SCHLEI (MISCELLANEOUS) ×2 IMPLANT
MANIFOLD NEPTUNE II (INSTRUMENTS) ×4 IMPLANT
NEEDLE SPNL 18GX3.5 QUINCKE PK (NEEDLE) ×2 IMPLANT
PACK SHOULDER (CUSTOM PROCEDURE TRAY) ×2 IMPLANT
PAD ABD 8X10 STRL (GAUZE/BANDAGES/DRESSINGS) ×2 IMPLANT
POSITIONER SURGICAL ARM (MISCELLANEOUS) ×2 IMPLANT
PROBE BIPOLAR ATHRO 135MM 90D (MISCELLANEOUS) ×2 IMPLANT
SLING ARM FOAM STRAP LRG (SOFTGOODS) ×2 IMPLANT
SLING ARM IMMOBILIZER LRG (SOFTGOODS) ×2 IMPLANT
SLING ARM IMMOBILIZER MED (SOFTGOODS) ×2 IMPLANT
SUT ETHILON 4 0 PS 2 18 (SUTURE) ×2 IMPLANT
TOWEL OR 17X26 10 PK STRL BLUE (TOWEL DISPOSABLE) ×4 IMPLANT
TUBING ARTHRO INFLOW-ONLY STRL (TUBING) ×2 IMPLANT
TUBING CONNECTING 10 (TUBING) ×2 IMPLANT

## 2017-04-05 NOTE — Anesthesia Procedure Notes (Addendum)
Anesthesia Regional Block: Interscalene brachial plexus block   Pre-Anesthetic Checklist: ,, timeout performed, Correct Patient, Correct Site, Correct Laterality, Correct Procedure, Correct Position, site marked, Risks and benefits discussed,  Surgical consent,  Pre-op evaluation,  At surgeon's request and post-op pain management  Laterality: Right  Prep: chloraprep       Needles:  Injection technique: Single-shot  Needle Type: Echogenic Stimulator Needle     Needle Length: 9cm      Additional Needles:   Procedures: ultrasound guided,,,,,,,,  Narrative:  Start time: 04/05/2017 12:20 PM End time: 04/05/2017 12:20 PM Injection made incrementally with aspirations every 5 mL.  Performed by: Personally  Anesthesiologist: Nicklos Gaxiola  Additional Notes: Patient tolerated the procedure well without complications

## 2017-04-05 NOTE — Brief Op Note (Signed)
04/05/2017  2:23 PM  PATIENT:  Pamela Roberson  61 y.o. 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  PROCEDURE:  Procedure(s): Right shoulder arthroscopy, distal clavicle excision, subacromial decompression (Right)  SURGEON:  Surgeon(s) and Role:    * Nicholes Stairs, MD - Primary  PHYSICIAN ASSISTANT:   ASSISTANTS: none   ANESTHESIA:   regional and general  EBL:  Total I/O In: 700 [I.V.:700] Out: 15 [Blood:15]  BLOOD ADMINISTERED:none  DRAINS: none   LOCAL MEDICATIONS USED:  NONE  SPECIMEN:  No Specimen  DISPOSITION OF SPECIMEN:  N/A  COUNTS:  YES  TOURNIQUET:  * No tourniquets in log *  DICTATION: .Note written in EPIC  PLAN OF CARE: Discharge to home after PACU  PATIENT DISPOSITION:  PACU - hemodynamically stable.   Delay start of Pharmacological VTE agent (>24hrs) due to surgical blood loss or risk of bleeding: not applicable

## 2017-04-05 NOTE — Anesthesia Postprocedure Evaluation (Signed)
Anesthesia Post Note  Patient: Pamela Roberson  Procedure(s) Performed: Procedure(s) (LRB): Right shoulder arthroscopy, distal clavicle excision, subacromial decompression (Right)  Patient location during evaluation: PACU Anesthesia Type: Regional and General Level of consciousness: awake and alert Pain management: pain level controlled Vital Signs Assessment: post-procedure vital signs reviewed and stable Respiratory status: spontaneous breathing, nonlabored ventilation and respiratory function stable Cardiovascular status: blood pressure returned to baseline and stable Postop Assessment: no signs of nausea or vomiting Anesthetic complications: no       Last Vitals:  Vitals:   04/05/17 1500 04/05/17 1515  BP: 126/83 124/75  Pulse: 87 79  Resp: 20 17  Temp:  36.4 C    Last Pain:  Vitals:   04/05/17 1515  TempSrc:   PainSc: 0-No pain                 Darletta [place],W. EDMOND

## 2017-04-05 NOTE — H&P (Signed)
ORTHOPAEDIC H and P  REQUESTING PHYSICIAN: Nicholes Stairs, MD  PCP:  [facility]  Chief Complaint: Right shoulder pain  HPI: Pamela Roberson is a 61 y.o. female who complains of chronic right shoulder pain with impingement and AC joint pain.  She was under the care of my partner for many months managing recalcitrant shoulder pain with multiple injections and subacromial space as well as physical therapy. She was referred to me following the failure of this conservative management discussed potential surgical options. We reviewed her MRI and clinical exam findings my office last month. She was indicated for arthroscopic right shoulder surgery including evaluation of her rotator cuff subacromial decompression distal clavicle excision. She presents here today for that surgery. She has no new questions at this time no new symptoms. She does have a lot of chronic neck and back pain that she is on chronic narcotics for however is ready to move forward shoulder surgery today anticipating some improvement in those symptoms postoperatively.  Past Medical History:  Diagnosis Date  . Arthritis   . Diabetes mellitus without complication ([place])    type II   . Diverticulitis   . Dyspnea    mild   . GERD (gastroesophageal reflux disease)   . History of bronchitis   . Hypertension   . IBS (irritable bowel syndrome)   . Palpitations   . Sleep apnea    mild but no sleep apnea   . TMJ (dislocation of temporomandibular joint)   . 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 ABDOMINAL HYSTERECTOMY    . ESOPHAGOGASTRODUODENOSCOPY ENDOSCOPY     with esophagus being stretched twice per pt,   . EYE SURGERY    . 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 feeding tube placement and removal   2015  . NASAL SEPTUM SURGERY    . spurs removed from esophagus   2015  . TONSILLECTOMY     Social History   Social History  . Marital  status: Widowed    Spouse name: N/A  . Number of children: 3  . Years of education: N/A   Social History Main Topics  . Smoking status: Former Research scientist (life sciences)  . Smokeless tobacco: Never Used  . Alcohol use No  . Drug use: No  . Sexual activity: Not Asked   Other Topics Concern  . None   Social History Narrative  . None   History reviewed. No pertinent family history. Allergies  Allergen Reactions  . Crab [Shellfish Allergy] Shortness Of Breath and Swelling  . Erythromycin Swelling and Rash  . Prednisone Palpitations  . Fish Allergy     Swelling   . Metoclopramide Other (See Comments)    Slurred speech and unable to move muscles,  Caused her to drag her feet  . Levaquin [Levofloxacin In D5w] Other (See Comments)    Causes irregular heart beat  . Morphine And Related Nausea And Vomiting and Rash    Irregular heart beat  . Sulfa Antibiotics Rash   Prior to Admission medications   Medication Sig Start Date End Date Taking? Authorizing Provider  acetaminophen (TYLENOL) 500 MG tablet Take 1,000 mg by mouth every 6 (six) hours as needed for mild pain.   Yes Historical Provider, MD  acyclovir (ZOVIRAX) 400 MG tablet Take 400 mg by mouth 2 (two) times daily.   Yes Historical Provider, MD  albuterol (PROVENTIL HFA;VENTOLIN  HFA) 108 (90 BASE) MCG/ACT inhaler Inhale 2 puffs into the lungs 2 (two) times daily.    Yes Historical Provider, MD  albuterol (PROVENTIL) (2.5 MG/3ML) 0.083% nebulizer solution Take 2.5 mg by nebulization every 6 (six) hours as needed for wheezing or shortness of breath.   Yes Historical Provider, MD  amoxicillin-clavulanate (AUGMENTIN) 875-125 MG tablet Take 1 tablet by mouth 2 (two) times daily. 03/24/17  Yes Historical Provider, MD  cyclobenzaprine (FLEXERIL) 10 MG tablet Take 10 mg by mouth 3 (three) times daily as needed (TMJ).    Yes Historical Provider, MD  dicyclomine (BENTYL) 10 MG capsule Take 1 capsule (10 mg total) by mouth 3 (three) times daily as needed for  spasms. Patient taking differently: Take 10 mg by mouth 3 (three) times daily as needed for spasms (Stomach).  10/21/16  Yes Mauri Pole, MD  Fluticasone-Salmeterol (ADVAIR) 250-50 MCG/DOSE AEPB Inhale 1 puff into the lungs 2 (two) times daily.   Yes Historical Provider, MD  gabapentin (NEURONTIN) 300 MG capsule Take 300 mg by mouth 3 (three) times daily.   Yes Historical Provider, MD  loratadine (CLARITIN) 10 MG tablet Take 10 mg by mouth daily.   Yes Historical Provider, MD  magnesium hydroxide (MILK OF MAGNESIA) 400 MG/5ML suspension Take 60 mLs by mouth daily.   Yes Historical Provider, MD  metoprolol tartrate (LOPRESSOR) 25 MG tablet Take 25 mg by mouth 2 (two) times daily.   Yes Historical Provider, MD  montelukast (SINGULAIR) 10 MG tablet Take 10 mg by mouth every evening.    Yes Historical Provider, MD  Multiple Vitamin (MULTIVITAMIN WITH MINERALS) TABS tablet Take 1 tablet by mouth daily.   Yes Historical Provider, MD  omeprazole (PRILOSEC) 40 MG capsule Take 1 capsule (40 mg total) by mouth 2 (two) times daily before a meal. 11/17/16 03/28/17 Yes Kavitha Nandigam V, MD  sitaGLIPtin-metformin (JANUMET) 50-500 MG tablet Take 1 tablet by mouth daily.   Yes Historical Provider, MD  sucralfate (CARAFATE) 1 g tablet Take 1 tablet (1 g total) by mouth 4 (four) times daily -  with meals and at bedtime. Patient taking differently: Take 1 g by mouth 4 (four) times daily.  01/16/17  Yes Mauri Pole, MD  EPINEPHrine 0.3 mg/0.3 mL IJ SOAJ injection Inject 0.3 mg into the muscle once as needed (allergic reactions).     Historical Provider, MD  Plecanatide (TRULANCE) 3 MG TABS Take 3 mg by mouth daily. May take with or without food Patient not taking: Reported on 03/28/2017 11/17/16   Mauri Pole, MD   No results found.  Positive ROS: All other systems have been reviewed and were otherwise negative with the exception of those mentioned in the HPI and as above.  Physical  Exam: General: Alert, no acute distress Cardiovascular: No pedal edema Respiratory: No cyanosis, no use of accessory musculature GI: No organomegaly, abdomen is soft and non-tender Skin: No lesions in the area of chief complaint Neurologic: Sensation intact distally Psychiatric: Patient is competent for consent with normal mood and affect Lymphatic: No axillary or cervical lymphadenopathy  MUSCULOSKELETAL:  Right shoulder exam- no overlying skin changes no wounds. She's tender to palpation at the [facility] joint. Limited motion secondary to pain positive impingement signs. She does have 5 minus out of 5 rotator cuff strength secondary to pain and guarding. She endorses sensation intact to light touch distally. 2+ radial pulse.  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  Plan: -We reviewed the risk benefits and indications for the procedure in the preoperative holding area. These include but are not limited to bleeding infection and anesthetic risk of blood clot persistent pain and need for further surgery in the future. She also will be exposed to the risk of nerve and vessel damage. -Consent signed in the preoperative holding area. The patient's right upper extremity was marked for surgical preoperative checklist. -We will plan on discharging her home from PACU postoperatively unless there are any anesthetic issues.    Nicholes Stairs, MD Cell [phone]    04/05/2017 12:12 PM

## 2017-04-05 NOTE — Anesthesia Preprocedure Evaluation (Signed)
Anesthesia Evaluation  Patient identified by MRN, date of birth, ID band Patient awake    Reviewed: Allergy & Precautions, NPO status , Patient's Chart, lab work & pertinent test results, reviewed documented beta blocker date and time   Airway Mallampati: III  TM Distance: >3 FB Neck ROM: Full    Dental no notable dental hx. (+) Teeth Intact   Pulmonary shortness of breath and with exertion, sleep apnea , former smoker,  Hx/o + PPD   Pulmonary exam normal breath sounds clear to auscultation       Cardiovascular hypertension, Pt. on medications and Pt. on home beta blockers Normal cardiovascular exam Rhythm:Regular Rate:Normal     Neuro/Psych negative neurological ROS  negative psych ROS   GI/Hepatic GERD  Medicated and Controlled,IBS   Endo/Other  diabetes, Well Controlled, Type 2, Oral Hypoglycemic AgentsObesity  Renal/GU   negative genitourinary   Musculoskeletal  (+) Arthritis , Right shoulder AC arthritis and subacromial impingement   Abdominal   Peds  Hematology negative hematology ROS (+)   Anesthesia Other Findings   Reproductive/Obstetrics                             Anesthesia Physical Anesthesia Plan  ASA: III  Anesthesia Plan: General and Regional   Post-op Pain Management:  Regional for Post-op pain   Induction: Intravenous  Airway Management Planned: Oral ETT  Additional Equipment:   Intra-op Plan:   Post-operative Plan: Extubation in OR  Informed Consent: I have reviewed the patients History and Physical, chart, labs and discussed the procedure including the risks, benefits and alternatives for the proposed anesthesia with the patient or authorized representative who has indicated his/her understanding and acceptance.   Dental advisory given  Plan Discussed with: Anesthesiologist, CRNA and Surgeon  Anesthesia Plan Comments:         Anesthesia Quick  Evaluation

## 2017-04-05 NOTE — Transfer of Care (Signed)
Immediate Anesthesia Transfer of Care Note  Patient: Pamela Roberson  Procedure(s) Performed: Procedure(s): Right shoulder arthroscopy, distal clavicle excision, subacromial decompression (Right)  Patient Location: PACU  Anesthesia Type:General  Level of Consciousness:  sedated, patient cooperative and responds to stimulation  Airway & Oxygen Therapy:Patient Spontanous Breathing and Patient connected to face mask oxgen  Post-op Assessment:  Report given to PACU RN and Post -op Vital signs reviewed and stable  Post vital signs:  Reviewed and stable  Last Vitals:  Vitals:   04/05/17 1230 04/05/17 1231  BP:    Pulse: 72 80  Resp: (!) 21   Temp:      Complications: No apparent anesthesia complications

## 2017-04-05 NOTE — Discharge Instructions (Signed)

## 2017-04-05 NOTE — Op Note (Signed)
Date of Surgery: 04/05/2017  INDICATIONS: Ms. Gienger is a 61 y.o.-year-old female with a right shoulder impingement, rotator cuff partial tear, AC joint arthritis and biceps tendonitis.  She was managed for months by my partner with multiple injections into the right shoulder subacromial space and with therapy.  Over the last few months, her symptoms had intensified and she was referred to me for surgical evaluation.  We discussed moving forward with arthroscopic intervention with debridement of partial cuff tear, subcromial decompression, distal clavicle resection and possible biceps tenotomy depending on findings;  The patient did consent to the procedure after discussion of the risks and benefits, which include but are not limited to, bleeding, infection, continued pain, damage to surrounding nerves or arteries, need for future surgery, pop eye deformity of the biceps, blod clots and risk of anesthesia.  PREOPERATIVE DIAGNOSIS:  1.  Right shoulder subacromial impingement 2. Right shoulder AC joint arthritis 3. Right shoulder partial rotator cuff tear 4.  Right shoulder biceps tendinitis  POSTOPERATIVE DIAGNOSIS: Same.  PROCEDURE: 1.  Right shoulder arthroscopy with subacromial decompression 2. Right shoulder arthroscopic distal clavicle excision of 1.0 cm 3.  Right shoulder arthroscopic extensive debridement including, rotator interval, anterior labrum, superior labrum, supraspinatus tendon, biceps tenotomy, and subacromial bursectomy  SURGEON: Geralynn Rile, M.D.  ASSIST: none.  ANESTHESIA:  general, and regional  IV FLUIDS AND URINE: See anesthesia.  ESTIMATED BLOOD LOSS: 10 mL.  IMPLANTS: none  DRAINS: none  COMPLICATIONS: None.  DESCRIPTION OF PROCEDURE: The patient was brought to the operating room and placed supine on the operating table.  The patient had been signed prior to the procedure and this was documented. The patient had the anesthesia placed by the  anesthesiologist.  A time-out was performed to confirm that this was the correct patient, site, side and location. The patient did receive antibiotics prior to the incision and was re-dosed during the procedure as needed at indicated intervals.   An exam under anesthesia revealed full range of motion. She was placed in the left lateral decubitus position with an axillary roll and all bony prominences properly padded. The arm was prepped and draped in the standard sterile fashion.  A standard surgical timeout was performed. She was placed in 10 pounds of gentle in-line suspension.  Standard posterior and anterior superior portals were established. A diagnostic evaluation of the glenohumeral joint was performed. The biceps tendon was markedly synovitic and erythematous at the anchor and by the sling. It was tenotomized. An extensive debridement was performed of the superior anterior and posterior labrum, rotator interval and subacromial bursectomy. The articular surfaces revealed no significant chondromalacia . The subscapularis was intact. The rotator cuff supraspinatus was partially torn, less than 50% at the greater tuberosity footprint. This tear was debrided gently.  The infraspinatus and teres minor were intact.  There was also noted to be capsulitis and erythema throughout the joint.  No loose bodies.   The arthroscope was inserted in the subacromial space and an additional lateral portal was established. An acromioplasty performed nicely decompressing the subacromial space with a motorized burr, in a cutting block technique.  The CA ligament was reflected off of the anterior acromion but not resected.  Bursitis in the subacromial space was removed as well as releases were performed on the bursal surface of the rotator cuff.  The bursal side of the rotator cuff showed no tearing, but marked bursitis.  Next, we turned our attention to the distal clavicle resection.  The anterior and  inferior soft tissues  were debrided from the [facility] joint to expose the bone end.  The distal clavicle was resected with the motorized burr, removing 1.0 cm of bone.  Care was taken to preserve the superior and posterior capsule structures.    Final pictures were taken.  All counts were correct and there were no complications.  The arthroscope was then removed and portals closed with nylons in standard fashion followed by a sterile occlusive dressing Polar Care ice sleeve and a sling. The patient was sent to recovery in stable condition and tolerated the procedure well.  POSTOPERATIVE PLAN: Carloyn Lahue will be WBAT to the right arm, but in her sling until the block wears off.  She will take asa bid for DVT ppx for 2 weeks and mobilize immediately.  She will return to see me in 2 weeks for wound check.

## 2017-04-05 NOTE — Progress Notes (Signed)
Assisted Dr. Rose with right, ultrasound guided, interscalene  block. Side rails up, monitors on throughout procedure. See vital signs in flow sheet. Tolerated Procedure well. 

## 2017-04-05 NOTE — Anesthesia Procedure Notes (Signed)
Procedure Name: Intubation Date/Time: 04/05/2017 1:02 PM Performed by: Durand Wittmeyer, Virgel Gess Pre-anesthesia Checklist: Patient identified, Emergency Drugs available, Suction available, Patient being monitored and Timeout performed Patient Re-evaluated:Patient Re-evaluated prior to inductionOxygen Delivery Method: Circle system utilized Preoxygenation: Pre-oxygenation with 100% oxygen Intubation Type: IV induction Ventilation: Mask ventilation without difficulty Laryngoscope Size: Mac and 4 Grade View: Grade I Tube type: Oral Tube size: 7.5 mm Number of attempts: 1 Airway Equipment and Method: Stylet Placement Confirmation: ETT inserted through vocal cords under direct vision,  positive ETCO2,  CO2 detector and breath sounds checked- equal and bilateral Secured at: 21 cm Tube secured with: Tape Dental Injury: Teeth and Oropharynx as per pre-operative assessment

## 2017-04-07 ENCOUNTER — Encounter (HOSPITAL_COMMUNITY): Payer: Self-pay | Admitting: Orthopedic Surgery

## 2017-05-01 ENCOUNTER — Encounter (HOSPITAL_COMMUNITY): Payer: Self-pay

## 2017-05-01 ENCOUNTER — Emergency Department (HOSPITAL_COMMUNITY): Payer: Medicare Other

## 2017-05-01 ENCOUNTER — Emergency Department (HOSPITAL_COMMUNITY)
Admission: EM | Admit: 2017-05-01 | Discharge: 2017-05-01 | Disposition: A | Discharge status: Home / Self Care | Payer: Medicare Other | Attending: Emergency Medicine | Admitting: Emergency Medicine

## 2017-05-01 DIAGNOSIS — Z79899 Other long term (current) drug therapy: Secondary | ICD-10-CM | POA: Diagnosis not present

## 2017-05-01 DIAGNOSIS — J301 Allergic rhinitis due to pollen: Secondary | ICD-10-CM | POA: Insufficient documentation

## 2017-05-01 DIAGNOSIS — E119 Type 2 diabetes mellitus without complications: Secondary | ICD-10-CM | POA: Insufficient documentation

## 2017-05-01 DIAGNOSIS — I10 Essential (primary) hypertension: Secondary | ICD-10-CM | POA: Insufficient documentation

## 2017-05-01 DIAGNOSIS — R51 Headache: Secondary | ICD-10-CM | POA: Diagnosis not present

## 2017-05-01 DIAGNOSIS — Z87891 Personal history of nicotine dependence: Secondary | ICD-10-CM | POA: Insufficient documentation

## 2017-05-01 DIAGNOSIS — R05 Cough: Secondary | ICD-10-CM

## 2017-05-01 DIAGNOSIS — R059 Cough, unspecified: Secondary | ICD-10-CM

## 2017-05-01 DIAGNOSIS — R519 Headache, unspecified: Secondary | ICD-10-CM

## 2017-05-01 LAB — URINALYSIS, ROUTINE W REFLEX MICROSCOPIC
Bilirubin Urine: NEGATIVE
Glucose, UA: NEGATIVE mg/dL
Hgb urine dipstick: NEGATIVE
Ketones, ur: NEGATIVE mg/dL
Leukocytes, UA: NEGATIVE
Nitrite: NEGATIVE
Protein, ur: NEGATIVE mg/dL
Specific Gravity, Urine: 1.002 — ABNORMAL LOW (ref 1.005–1.030)
pH: 9 — ABNORMAL HIGH (ref 5.0–8.0)

## 2017-05-01 LAB — CBC WITH DIFFERENTIAL/PLATELET
Basophils Absolute: 0 10*3/uL (ref 0.0–0.1)
Basophils Relative: 1 %
Eosinophils Absolute: 0.1 10*3/uL (ref 0.0–0.7)
Eosinophils Relative: 1 %
HCT: 40.3 % (ref 36.0–46.0)
Hemoglobin: 13.4 g/dL (ref 12.0–15.0)
Lymphocytes Relative: 42 %
Lymphs Abs: 1.5 10*3/uL (ref 0.7–4.0)
MCH: 29.2 pg (ref 26.0–34.0)
MCHC: 33.3 g/dL (ref 30.0–36.0)
MCV: 87.8 fL (ref 78.0–100.0)
Monocytes Absolute: 0.3 10*3/uL (ref 0.1–1.0)
Monocytes Relative: 8 %
Neutro Abs: 1.8 10*3/uL (ref 1.7–7.7)
Neutrophils Relative %: 48 %
Platelets: 256 10*3/uL (ref 150–400)
RBC: 4.59 MIL/uL (ref 3.87–5.11)
RDW: 13.2 % (ref 11.5–15.5)
WBC: 3.7 10*3/uL — ABNORMAL LOW (ref 4.0–10.5)

## 2017-05-01 LAB — COMPREHENSIVE METABOLIC PANEL
ALT: 19 U/L (ref 14–54)
AST: 27 U/L (ref 15–41)
Albumin: 3.9 g/dL (ref 3.5–5.0)
Alkaline Phosphatase: 101 U/L (ref 38–126)
Anion gap: 8 (ref 5–15)
BUN: 8 mg/dL (ref 6–20)
CO2: 28 mmol/L (ref 22–32)
Calcium: 9.3 mg/dL (ref 8.9–10.3)
Chloride: 108 mmol/L (ref 101–111)
Creatinine, Ser: 1 mg/dL (ref 0.44–1.00)
GFR calc Af Amer: >60 mL/min (ref >60)
GFR calc non Af Amer: 60 mL/min — ABNORMAL LOW (ref >60)
Glucose, Bld: 120 mg/dL — ABNORMAL HIGH (ref 65–99)
Potassium: 4.1 mmol/L (ref 3.5–5.1)
Sodium: 144 mmol/L (ref 135–145)
Total Bilirubin: 0.4 mg/dL (ref 0.3–1.2)
Total Protein: 6.7 g/dL (ref 6.5–8.1)

## 2017-05-01 MED ORDER — FLUTICASONE PROPIONATE 50 MCG/ACT NA SUSP: 2.0000 | Freq: Every day | NASAL | 1 refills | Status: DC

## 2017-05-01 MED ORDER — ALBUTEROL SULFATE (2.5 MG/3ML) 0.083% IN NEBU: 2.5000 mg | INHALATION_SOLUTION | RESPIRATORY_TRACT | 0 refills | Status: DC | PRN

## 2017-05-01 MED ORDER — TRAMADOL HCL 50 MG PO TABS: 50.0000 mg | ORAL_TABLET | Freq: Four times a day (QID) | ORAL | 0 refills | Status: DC | PRN

## 2017-05-01 MED ORDER — LORATADINE 10 MG PO TABS: 10.0000 mg | ORAL_TABLET | Freq: Every day | ORAL | 1 refills | Status: DC

## 2017-05-01 NOTE — ED Provider Notes (Signed)
[facility] DEPT Provider Note   CSN: [id number] Arrival date & time: 05/01/17  1631     History   Chief Complaint Chief Complaint  Patient presents with  . Cough  . Headache  . Nausea    HPI Kandyce Dieguez is a 61 y.o. female.  HPI Patient reports she has a generalized headache is frontal and posterior. Throbbing and pressure quality. It's been going on for several weeks. She reports she gets much worse when the pollen is high. She reports she's had problems with allergies to all trees except 3 species (she had allergy testing in the past) for many years. She reports she feels like she has mucus in her throat and thinks it's going to move into a bronchitis. She reports she gets a dry cough. She reports she is out of her albuterol solution. She reports she's had Flonase in the past when she has these symptoms but she doesn't have any currently. She reports she has been taking some over-the-counter Claritin but she doesn't feel like it works as well as one has been prescribed in the past. No fever or chills, no visual symptoms, no focal weakness numbness or tingling. She for she otherwise feels fine. Past Medical History:  Diagnosis Date  . Arthritis   . Diabetes mellitus without complication ([place])    type II   . Diverticulitis   . Dyspnea    mild   . GERD (gastroesophageal reflux disease)   . History of bronchitis   . Hypertension   . IBS (irritable bowel syndrome)   . Palpitations   . Sleep apnea    mild but no sleep apnea   . TMJ (dislocation of temporomandibular joint)   . 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 Biceps tendinitis of right shoulder 04/05/2017  . Impingement syndrome of right shoulder 04/05/2017  . Arthritis of right acromioclavicular joint 04/05/2017  . Partial nontraumatic tear of rotator cuff, right 04/05/2017    Past Surgical History:  Procedure Laterality Date  . ABDOMINAL  HYSTERECTOMY    . ESOPHAGOGASTRODUODENOSCOPY ENDOSCOPY     with esophagus being stretched twice per pt,   . EYE SURGERY    . 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 feeding tube placement and removal   2015  . NASAL SEPTUM SURGERY    . spurs removed from esophagus   2015  . TONSILLECTOMY      OB History    No data available       Home Medications    Prior to Admission medications   Medication Sig Start Date End Date Taking? Authorizing Provider  acetaminophen (TYLENOL) 500 MG tablet Take 1,000 mg by mouth every 6 (six) hours as needed for mild pain.    [provider]  acyclovir (ZOVIRAX) 400 MG tablet Take 400 mg by mouth 2 (two) times daily.    [provider]  albuterol (PROVENTIL HFA;VENTOLIN HFA) 108 (90 BASE) MCG/ACT inhaler Inhale 2 puffs into the lungs 2 (two) times daily.     [provider]  albuterol (PROVENTIL) (2.5 MG/3ML) 0.083% nebulizer solution Take 2.5 mg by nebulization every 6 (six) hours as needed for wheezing or shortness of breath.    [provider]  albuterol (PROVENTIL) (2.5 MG/3ML) 0.083% nebulizer solution Take 3 mLs (2.5 mg total) by nebulization every 4 (four) hours as needed for wheezing or shortness of  breath. 05/01/17   Charlesetta Shanks, MD  amoxicillin-clavulanate (AUGMENTIN) 875-125 MG tablet Take 1 tablet by mouth 2 (two) times daily. 03/24/17   [provider]  aspirin EC 325 MG tablet Take 1 tablet (325 mg total) by mouth daily. 04/05/17   Nicholes Stairs, MD  cyclobenzaprine (FLEXERIL) 10 MG tablet Take 10 mg by mouth 3 (three) times daily as needed (TMJ).     [provider]  dicyclomine (BENTYL) 10 MG capsule Take 1 capsule (10 mg total) by mouth 3 (three) times daily as needed for spasms. Patient taking differently: Take 10 mg by mouth 3 (three) times daily as needed for spasms (Stomach).  10/21/16   Mauri Pole, MD  EPINEPHrine 0.3 mg/0.3 mL IJ SOAJ injection Inject 0.3 mg into  the muscle once as needed (allergic reactions).     [provider]  fluticasone (FLONASE) 50 MCG/ACT nasal spray Place 2 sprays into both nostrils daily. 05/01/17   Charlesetta Shanks, MD  Fluticasone-Salmeterol (ADVAIR) 250-50 MCG/DOSE AEPB Inhale 1 puff into the lungs 2 (two) times daily.    [provider]  gabapentin (NEURONTIN) 300 MG capsule Take 300 mg by mouth 3 (three) times daily.    [provider]  HYDROmorphone (DILAUDID) 2 MG tablet Take 1 tablet (2 mg total) by mouth every 4 (four) hours as needed for severe pain. 04/05/17   Nicholes Stairs, MD  loratadine (CLARITIN) 10 MG tablet Take 10 mg by mouth daily.    [provider]  loratadine (CLARITIN) 10 MG tablet Take 1 tablet (10 mg total) by mouth daily. 05/01/17   Charlesetta Shanks, MD  magnesium hydroxide (MILK OF MAGNESIA) 400 MG/5ML suspension Take 60 mLs by mouth daily.    [provider]  metoprolol tartrate (LOPRESSOR) 25 MG tablet Take 25 mg by mouth 2 (two) times daily.    [provider]  montelukast (SINGULAIR) 10 MG tablet Take 10 mg by mouth every evening.     [provider]  Multiple Vitamin (MULTIVITAMIN WITH MINERALS) TABS tablet Take 1 tablet by mouth daily.    [provider]  omeprazole (PRILOSEC) 40 MG capsule Take 1 capsule (40 mg total) by mouth 2 (two) times daily before a meal. 11/17/16 03/28/17  Nandigam, Venia Minks, MD  ondansetron (ZOFRAN ODT) 4 MG disintegrating tablet Take 1 tablet (4 mg total) by mouth every 8 (eight) hours as needed for nausea or vomiting. 04/05/17   Nicholes Stairs, MD  Plecanatide (TRULANCE) 3 MG TABS Take 3 mg by mouth daily. May take with or without food Patient not taking: Reported on 03/28/2017 11/17/16   Mauri Pole, MD  sitaGLIPtin-metformin (JANUMET) 50-500 MG tablet Take 1 tablet by mouth daily.    [provider]  sucralfate (CARAFATE) 1 g tablet Take 1 tablet (1 g total) by mouth 4 (four)  times daily -  with meals and at bedtime. Patient taking differently: Take 1 g by mouth 4 (four) times daily.  01/16/17   Mauri Pole, MD  traMADol (ULTRAM) 50 MG tablet Take 1 tablet (50 mg total) by mouth every 6 (six) hours as needed. 05/01/17   Charlesetta Shanks, MD    Family History History reviewed. No pertinent family history.  Social History Social History  Substance Use Topics  . Smoking status: Former Research scientist (life sciences)  . Smokeless tobacco: Never Used  . Alcohol use No     Allergies   Crab [shellfish allergy]; Erythromycin; Prednisone; Fish allergy; Metoclopramide; Levaquin [levofloxacin  in d5w]; Morphine and related; and Sulfa antibiotics   Review of Systems Review of Systems 10 Systems reviewed and are negative for acute change except as noted in the HPI.   Physical Exam Updated Vital Signs BP (!) 143/88   Pulse 78   Temp 97.9 F (36.6 C) (Oral)   Resp 18   Ht 5\' 7"  (1.702 m)   Wt 95.3 kg (210 lb)   SpO2 100%   BMI 32.89 kg/m   Physical Exam  Constitutional: She appears well-developed and well-nourished. No distress.  HENT:  Head: Normocephalic and atraumatic.  Right Ear: External ear normal.  Left Ear: External ear normal.  Nose: Nose normal.  Mouth/Throat: Oropharynx is clear and moist.  Posterior oropharynx widely patent. No erythema and no postnasal drip. Both nares are patent. No facial swelling. Neck supple.  Eyes: Conjunctivae and EOM are normal. Pupils are equal, round, and reactive to light.  Neck: Neck supple.  Cardiovascular: Normal rate and regular rhythm.   No murmur heard. Pulmonary/Chest: Effort normal and breath sounds normal. No respiratory distress.  Abdominal: Soft. There is no tenderness.  Musculoskeletal: She exhibits no edema.  Neurological: She is alert.  Skin: Skin is warm and dry.  Psychiatric: She has a normal mood and affect.  Nursing note and vitals reviewed.    ED Treatments / Results  Labs (all labs ordered are listed,  but only abnormal results are displayed) Labs Reviewed  COMPREHENSIVE METABOLIC PANEL - Abnormal; Notable for the following:       Result Value   Glucose, Bld 120 (*)    GFR calc non Af Amer 60 (*)    All other components within normal limits  CBC WITH DIFFERENTIAL/PLATELET - Abnormal; Notable for the following:    WBC 3.7 (*)    All other components within normal limits  URINALYSIS, ROUTINE W REFLEX MICROSCOPIC - Abnormal; Notable for the following:    APPearance HAZY (*)    Specific Gravity, Urine 1.002 (*)    pH 9.0 (*)    All other components within normal limits    EKG  EKG Interpretation None       Radiology Dg Chest 2 View  Result Date: 05/01/2017 CLINICAL DATA:  Cough x1 week EXAM: CHEST  2 VIEW COMPARISON:  03/30/2017 FINDINGS: Lungs are clear.  No pleural effusion or pneumothorax. The heart is normal in size. Mild degenerative changes of the visualized thoracolumbar spine. Anterior cervical spine fixation hardware. IMPRESSION: No evidence of acute cardiopulmonary disease. Electronically Signed   By: Julian Hy M.D.   On: 05/01/2017 17:23    Procedures Procedures (including critical care time)  Medications Ordered in ED Medications - No data to display   Initial Impression / Assessment and Plan / ED Course  I have reviewed the triage vital signs and the nursing notes.  Pertinent labs & imaging results that were available during my care of the patient were reviewed by me and considered in my medical decision making (see chart for details).     Final Clinical Impressions(s) / ED Diagnoses   Final diagnoses:  Seasonal allergic rhinitis due to pollen  Sinus headache  Cough   Patient is clinically well. She has had recurrence of the same symptoms for years with seasonal changes. No associated symptoms suggestive of emergent neurologic or infectious etiology. Will refill symptomatic treatments for allergic rhinitis and periodic bronchospasm. Patient  currently is not wheezing and has no respiratory distress. New Prescriptions New Prescriptions   ALBUTEROL (PROVENTIL) (  2.5 MG/3ML) 0.083% NEBULIZER SOLUTION    Take 3 mLs (2.5 mg total) by nebulization every 4 (four) hours as needed for wheezing or shortness of breath.   FLUTICASONE (FLONASE) 50 MCG/ACT NASAL SPRAY    Place 2 sprays into both nostrils daily.   LORATADINE (CLARITIN) 10 MG TABLET    Take 1 tablet (10 mg total) by mouth daily.   TRAMADOL (ULTRAM) 50 MG TABLET    Take 1 tablet (50 mg total) by mouth every 6 (six) hours as needed.     Charlesetta Shanks, MD 05/01/17 [phone]

## 2017-05-01 NOTE — ED Triage Notes (Signed)
Pt c/o intermittent nausea, dull headache, and cough x1 week. Denies diarrhea or fever. A&Ox4. Ambualtory. Reports that her sugars have been under control at home. She also states that she has been taking allergy medication without relief.

## 2017-06-29 DIAGNOSIS — H16223 Keratoconjunctivitis sicca, not specified as Sjogren's, bilateral: Secondary | ICD-10-CM | POA: Insufficient documentation

## 2017-06-29 DIAGNOSIS — Z83511 Family history of glaucoma: Secondary | ICD-10-CM | POA: Insufficient documentation

## 2017-06-29 DIAGNOSIS — E119 Type 2 diabetes mellitus without complications: Secondary | ICD-10-CM | POA: Insufficient documentation

## 2017-06-29 DIAGNOSIS — H52203 Unspecified astigmatism, bilateral: Secondary | ICD-10-CM

## 2017-06-29 DIAGNOSIS — H43393 Other vitreous opacities, bilateral: Secondary | ICD-10-CM | POA: Insufficient documentation

## 2017-06-29 DIAGNOSIS — H524 Presbyopia: Secondary | ICD-10-CM

## 2017-06-29 DIAGNOSIS — H5203 Hypermetropia, bilateral: Secondary | ICD-10-CM | POA: Insufficient documentation

## 2017-06-29 DIAGNOSIS — H2513 Age-related nuclear cataract, bilateral: Secondary | ICD-10-CM | POA: Insufficient documentation

## 2017-07-15 ENCOUNTER — Emergency Department (HOSPITAL_COMMUNITY)
Admission: EM | Admit: 2017-07-15 | Discharge: 2017-07-16 | Disposition: A | Discharge status: Home / Self Care | Payer: Medicare Other | Attending: Emergency Medicine | Admitting: Emergency Medicine

## 2017-07-15 ENCOUNTER — Encounter (HOSPITAL_COMMUNITY): Payer: Self-pay

## 2017-07-15 DIAGNOSIS — Z87891 Personal history of nicotine dependence: Secondary | ICD-10-CM | POA: Insufficient documentation

## 2017-07-15 DIAGNOSIS — Z7984 Long term (current) use of oral hypoglycemic drugs: Secondary | ICD-10-CM | POA: Insufficient documentation

## 2017-07-15 DIAGNOSIS — R110 Nausea: Secondary | ICD-10-CM | POA: Diagnosis not present

## 2017-07-15 DIAGNOSIS — I10 Essential (primary) hypertension: Secondary | ICD-10-CM | POA: Insufficient documentation

## 2017-07-15 DIAGNOSIS — Z79899 Other long term (current) drug therapy: Secondary | ICD-10-CM | POA: Diagnosis not present

## 2017-07-15 DIAGNOSIS — E119 Type 2 diabetes mellitus without complications: Secondary | ICD-10-CM | POA: Insufficient documentation

## 2017-07-15 DIAGNOSIS — N898 Other specified noninflammatory disorders of vagina: Secondary | ICD-10-CM | POA: Insufficient documentation

## 2017-07-15 DIAGNOSIS — Z7982 Long term (current) use of aspirin: Secondary | ICD-10-CM | POA: Diagnosis not present

## 2017-07-15 LAB — COMPREHENSIVE METABOLIC PANEL
ALT: 19 U/L (ref 14–54)
AST: 24 U/L (ref 15–41)
Albumin: 3.6 g/dL (ref 3.5–5.0)
Alkaline Phosphatase: 94 U/L (ref 38–126)
Anion gap: 8 (ref 5–15)
BUN: 11 mg/dL (ref 6–20)
CO2: 29 mmol/L (ref 22–32)
Calcium: 9.4 mg/dL (ref 8.9–10.3)
Chloride: 100 mmol/L — ABNORMAL LOW (ref 101–111)
Creatinine, Ser: 1.14 mg/dL — ABNORMAL HIGH (ref 0.44–1.00)
GFR calc Af Amer: 59 mL/min — ABNORMAL LOW (ref >60)
GFR calc non Af Amer: 51 mL/min — ABNORMAL LOW (ref >60)
Glucose, Bld: 116 mg/dL — ABNORMAL HIGH (ref 65–99)
Potassium: 4.1 mmol/L (ref 3.5–5.1)
Sodium: 137 mmol/L (ref 135–145)
Total Bilirubin: 0.5 mg/dL (ref 0.3–1.2)
Total Protein: 6.3 g/dL — ABNORMAL LOW (ref 6.5–8.1)

## 2017-07-15 LAB — URINALYSIS, ROUTINE W REFLEX MICROSCOPIC
Bilirubin Urine: NEGATIVE
Glucose, UA: NEGATIVE mg/dL
Hgb urine dipstick: NEGATIVE
Ketones, ur: NEGATIVE mg/dL
Leukocytes, UA: NEGATIVE
Nitrite: NEGATIVE
Protein, ur: NEGATIVE mg/dL
Specific Gravity, Urine: 1.003 — ABNORMAL LOW (ref 1.005–1.030)
pH: 7 (ref 5.0–8.0)

## 2017-07-15 LAB — CBC
HCT: 41.9 % (ref 36.0–46.0)
Hemoglobin: 13.6 g/dL (ref 12.0–15.0)
MCH: 28 pg (ref 26.0–34.0)
MCHC: 32.5 g/dL (ref 30.0–36.0)
MCV: 86.2 fL (ref 78.0–100.0)
Platelets: 259 10*3/uL (ref 150–400)
RBC: 4.86 MIL/uL (ref 3.87–5.11)
RDW: 13.5 % (ref 11.5–15.5)
WBC: 6 10*3/uL (ref 4.0–10.5)

## 2017-07-15 LAB — LIPASE, BLOOD: Lipase: 31 U/L (ref 11–51)

## 2017-07-15 MED ORDER — ONDANSETRON 4 MG PO TBDP
4.0000 mg | ORAL_TABLET | Freq: Once | ORAL | Status: AC | PRN
  Administered 2017-07-15: 4 mg via ORAL

## 2017-07-15 MED ORDER — ONDANSETRON 4 MG PO TBDP
ORAL_TABLET | ORAL | Status: AC
  Filled 2017-07-15: qty 1

## 2017-07-15 NOTE — ED Provider Notes (Signed)
[facility] DEPT Provider Note   CSN: [id number] Arrival date & time: 07/15/17  2125     History   Chief Complaint Chief Complaint  Patient presents with  . Abdominal Pain  . Dizziness    HPI Pamela Roberson is a 61 y.o. female.  Patient presents with complaint of vaginal and rectal irritation described as burning. Symptoms have been going on for one month. No vaginal discharge, urinary frequency. She has been using MOM to help with bowel movements and states she has had loose bowel movements along with urination and her symptoms involve vulva, vagina and rectum. No fever but she reports chills. She is also having nausea without vomiting, and dizziness that is a mild lightheadedness. She reports follow up appointment with PCP scheduled for this week.    The history is provided by the patient. No language interpreter was used.    Past Medical History:  Diagnosis Date  . Arthritis   . Diabetes mellitus without complication ([place])    type II   . Diverticulitis   . Dyspnea    mild   . GERD (gastroesophageal reflux disease)   . History of bronchitis   . Hypertension   . IBS (irritable bowel syndrome)   . Palpitations   . Sleep apnea    mild but no sleep apnea   . TMJ (dislocation of temporomandibular joint)   . 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 Biceps tendinitis of right shoulder 04/05/2017  . Impingement syndrome of right shoulder 04/05/2017  . Arthritis of right acromioclavicular joint 04/05/2017  . Partial nontraumatic tear of rotator cuff, right 04/05/2017    Past Surgical History:  Procedure Laterality Date  . ABDOMINAL HYSTERECTOMY    . ESOPHAGOGASTRODUODENOSCOPY ENDOSCOPY     with esophagus being stretched twice per pt,   . EYE SURGERY    . 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 feeding tube placement and removal   2015  . NASAL SEPTUM SURGERY    . spurs removed from esophagus   2015   . TONSILLECTOMY      OB History    No data available       Home Medications    Prior to Admission medications   Medication Sig Start Date End Date Taking? Authorizing Provider  acyclovir (ZOVIRAX) 400 MG tablet Take 400 mg by mouth 2 (two) times daily.   Yes [provider]  albuterol (PROVENTIL HFA;VENTOLIN HFA) 108 (90 BASE) MCG/ACT inhaler Inhale 2 puffs into the lungs 2 (two) times daily.    Yes [provider]  albuterol (PROVENTIL) (2.5 MG/3ML) 0.083% nebulizer solution Take 2.5 mg by nebulization every 6 (six) hours as needed for wheezing or shortness of breath.   Yes [provider]  aspirin EC 325 MG tablet Take 1 tablet (325 mg total) by mouth daily. 04/05/17  Yes Nicholes Stairs, MD  cyclobenzaprine (FLEXERIL) 10 MG tablet Take 10 mg by mouth 3 (three) times daily as needed (TMJ).    Yes [provider]  EPINEPHrine 0.3 mg/0.3 mL IJ SOAJ injection Inject 0.3 mg into the muscle once as needed (allergic reactions).    Yes [provider]  Fluticasone-Salmeterol (ADVAIR) 250-50 MCG/DOSE AEPB Inhale 1 puff into the lungs 2 (two) times daily.   Yes [provider]  gabapentin (NEURONTIN) 300 MG capsule Take 300 mg by mouth 3 (three) times  daily.   Yes [provider]  loratadine (CLARITIN) 10 MG tablet Take 10 mg by mouth daily.   Yes [provider]  metoprolol tartrate (LOPRESSOR) 25 MG tablet Take 25 mg by mouth 2 (two) times daily.   Yes [provider]  montelukast (SINGULAIR) 10 MG tablet Take 10 mg by mouth every evening.    Yes [provider]  Multiple Vitamin (MULTIVITAMIN WITH MINERALS) TABS tablet Take 1 tablet by mouth daily.   Yes [provider]  sitaGLIPtin-metformin (JANUMET) 50-500 MG tablet Take 1 tablet by mouth daily.   Yes [provider]  acetaminophen (TYLENOL) 500 MG tablet Take 1,000 mg by mouth every 6 (six) hours as needed for mild pain.     [provider]  albuterol (PROVENTIL) (2.5 MG/3ML) 0.083% nebulizer solution Take 3 mLs (2.5 mg total) by nebulization every 4 (four) hours as needed for wheezing or shortness of breath. 05/01/17   Charlesetta Shanks, MD  amoxicillin-clavulanate (AUGMENTIN) 875-125 MG tablet Take 1 tablet by mouth 2 (two) times daily. 03/24/17   [provider]  dicyclomine (BENTYL) 10 MG capsule Take 1 capsule (10 mg total) by mouth 3 (three) times daily as needed for spasms. Patient taking differently: Take 10 mg by mouth 3 (three) times daily as needed for spasms (Stomach).  10/21/16   Mauri Pole, MD  fluticasone (FLONASE) 50 MCG/ACT nasal spray Place 2 sprays into both nostrils daily. 05/01/17   Charlesetta Shanks, MD  HYDROmorphone (DILAUDID) 2 MG tablet Take 1 tablet (2 mg total) by mouth every 4 (four) hours as needed for severe pain. 04/05/17   Nicholes Stairs, MD  loratadine (CLARITIN) 10 MG tablet Take 1 tablet (10 mg total) by mouth daily. 05/01/17   Charlesetta Shanks, MD  omeprazole (PRILOSEC) 40 MG capsule Take 1 capsule (40 mg total) by mouth 2 (two) times daily before a meal. 11/17/16 03/28/17  Nandigam, Venia Minks, MD  ondansetron (ZOFRAN ODT) 4 MG disintegrating tablet Take 1 tablet (4 mg total) by mouth every 8 (eight) hours as needed for nausea or vomiting. 04/05/17   Nicholes Stairs, MD  Plecanatide (TRULANCE) 3 MG TABS Take 3 mg by mouth daily. May take with or without food Patient not taking: Reported on 03/28/2017 11/17/16   Mauri Pole, MD  sucralfate (CARAFATE) 1 g tablet Take 1 tablet (1 g total) by mouth 4 (four) times daily -  with meals and at bedtime. Patient taking differently: Take 1 g by mouth 4 (four) times daily.  01/16/17   Mauri Pole, MD  traMADol (ULTRAM) 50 MG tablet Take 1 tablet (50 mg total) by mouth every 6 (six) hours as needed. 05/01/17   Charlesetta Shanks, MD    Family History History reviewed. No pertinent family history.  Social  History Social History  Substance Use Topics  . Smoking status: Former Research scientist (life sciences)  . Smokeless tobacco: Never Used  . Alcohol use No     Allergies   Crab [shellfish allergy]; Erythromycin; Prednisone; Fish allergy; Metoclopramide; Levaquin [levofloxacin in d5w]; Morphine and related; and Sulfa antibiotics   Review of Systems Review of Systems  Constitutional: Negative for chills and fever.  HENT: Negative.   Respiratory: Negative.   Cardiovascular: Negative.   Gastrointestinal: Positive for nausea. Negative for abdominal pain and vomiting.  Genitourinary: Positive for vaginal pain. Negative for vaginal bleeding and vaginal discharge.  Musculoskeletal: Negative.   Skin: Negative.   Neurological: Positive for light-headedness. Negative for syncope and weakness.  Physical Exam Updated Vital Signs BP 133/85   Pulse 65   Temp 98.4 F (36.9 C) (Oral)   Resp 16   SpO2 98%   Physical Exam  Constitutional: She is oriented to person, place, and time. She appears well-developed and well-nourished.  HENT:  Head: Normocephalic.  Neck: Normal range of motion. Neck supple.  Cardiovascular: Normal rate and regular rhythm.   Pulmonary/Chest: Effort normal and breath sounds normal. She has no wheezes. She has no rales.  Abdominal: Soft. Bowel sounds are normal. There is no tenderness. There is no rebound and no guarding.  Genitourinary:  Genitourinary Comments: External vagina and perineum are unremarkable in appearance. No rash, redness, swelling, or blistering. There is minimal white vaginal discharge present.   Musculoskeletal: Normal range of motion.  Neurological: She is alert and oriented to person, place, and time.  Skin: Skin is warm and dry. No rash noted.  Psychiatric: She has a normal mood and affect.     ED Treatments / Results  Labs (all labs ordered are listed, but only abnormal results are displayed) Labs Reviewed  COMPREHENSIVE METABOLIC PANEL - Abnormal; Notable  for the following:       Result Value   Chloride 100 (*)    Glucose, Bld 116 (*)    Creatinine, Ser 1.14 (*)    Total Protein 6.3 (*)    GFR calc non Af Amer 51 (*)    GFR calc Af Amer 59 (*)    All other components within normal limits  URINALYSIS, ROUTINE W REFLEX MICROSCOPIC - Abnormal; Notable for the following:    Color, Urine STRAW (*)    Specific Gravity, Urine 1.003 (*)    All other components within normal limits  LIPASE, BLOOD  CBC    EKG  EKG Interpretation None       Radiology No results found.  Procedures Procedures (including critical care time)  Medications Ordered in ED Medications  ondansetron (ZOFRAN-ODT) 4 MG disintegrating tablet (not administered)  ondansetron (ZOFRAN-ODT) disintegrating tablet 4 mg (4 mg Oral Given 07/15/17 2149)     Initial Impression / Assessment and Plan / ED Course  I have reviewed the triage vital signs and the nursing notes.  Pertinent labs & imaging results that were available during my care of the patient were reviewed by me and considered in my medical decision making (see chart for details).     Patient presents with ongoing, non-acute symptoms of vaginal and rectal burning. Exam is essentially unremarkable. She is also having nausea and lightheadedness that are felt unrelated. Symptoms x 1 month.  VSS. She can be discharged home and is encouraged to go to her scheduled appointment with PCP this week.   Final Clinical Impressions(s) / ED Diagnoses   Final diagnoses:  None   Vaginal irritation Nausea.   New Prescriptions New Prescriptions   No medications on file     Charlann Lange, Hershal Coria 64/40/34 [id number]    Delora Fuel, MD 95/63/87 [phone]

## 2017-07-15 NOTE — ED Notes (Signed)
ED Provider at bedside. 

## 2017-07-15 NOTE — ED Triage Notes (Signed)
Onset 1 month burning with BM and urination, nausea, vomiting, and abd pain.  Onset 2 weeks intermittant dizziness.

## 2017-07-16 DIAGNOSIS — N898 Other specified noninflammatory disorders of vagina: Secondary | ICD-10-CM | POA: Diagnosis not present

## 2017-07-16 LAB — WET PREP, GENITAL
Clue Cells Wet Prep HPF POC: NONE SEEN
Sperm: NONE SEEN
Trich, Wet Prep: NONE SEEN
Yeast Wet Prep HPF POC: NONE SEEN

## 2017-07-16 MED ORDER — ONDANSETRON 4 MG PO TBDP
4.0000 mg | ORAL_TABLET | Freq: Once | ORAL | Status: AC
  Administered 2017-07-16: 4 mg via ORAL
  Filled 2017-07-16: qty 1

## 2017-07-16 MED ORDER — ACETAMINOPHEN 325 MG PO TABS
650.0000 mg | ORAL_TABLET | Freq: Once | ORAL | Status: AC
  Administered 2017-07-16: 650 mg via ORAL
  Filled 2017-07-16: qty 2

## 2017-07-16 MED ORDER — ONDANSETRON 4 MG PO TBDP: 4.0000 mg | ORAL_TABLET | Freq: Three times a day (TID) | ORAL | 0 refills | Status: DC | PRN

## 2017-07-16 NOTE — Discharge Instructions (Signed)
Recommend topical Vagisil for vaginal and rectal irritation. This should help with pain. Take Zofran for nausea as directed. Keep your scheduled appointment this week with your doctor for further outpatient evaluation of ongoing symptoms.

## 2017-07-24 ENCOUNTER — Other Ambulatory Visit: Payer: Self-pay | Admitting: Pain Medicine

## 2017-07-24 DIAGNOSIS — M545 Low back pain: Secondary | ICD-10-CM

## 2017-08-04 ENCOUNTER — Ambulatory Visit
Admission: RE | Admit: 2017-08-04 | Discharge: 2017-08-04 | Disposition: A | Discharge status: Home / Self Care | Payer: Medicare Other | Source: Ambulatory Visit | Attending: Pain Medicine | Admitting: Pain Medicine

## 2017-08-04 DIAGNOSIS — M545 Low back pain: Secondary | ICD-10-CM

## 2017-11-18 ENCOUNTER — Ambulatory Visit (HOSPITAL_COMMUNITY)
Admission: EM | Admit: 2017-11-18 | Discharge: 2017-11-18 | Disposition: A | Discharge status: Home / Self Care | Payer: Medicare Other | Attending: Family Medicine | Admitting: Family Medicine

## 2017-11-18 ENCOUNTER — Encounter (HOSPITAL_COMMUNITY): Payer: Self-pay | Admitting: Emergency Medicine

## 2017-11-18 DIAGNOSIS — J069 Acute upper respiratory infection, unspecified: Secondary | ICD-10-CM

## 2017-11-18 MED ORDER — PREDNISONE 10 MG (21) PO TBPK: ORAL_TABLET | Freq: Every day | ORAL | 0 refills | Status: DC

## 2017-11-18 NOTE — ED Triage Notes (Signed)
PT C/O: cold sx associated w/dry cough, nasal drainage ... Reports she was seen by PCP earlier this week.   ONSET: 3 weeks   DENIES: fever  TAKING MEDS: doxycyline given by PCP, albuterol   A&O x4... NAD... Ambulatory

## 2017-11-20 NOTE — ED Provider Notes (Signed)
[facility]   [id number] 11/18/17 Arrival Time: [place]:  1. Upper respiratory tract infection, unspecified type     Meds ordered this encounter  Medications  . predniSONE (STERAPRED UNI-PAK 21 TAB) 10 MG (21) TBPK tablet    Sig: Take by mouth daily. Take as directed.    Dispense:  21 tablet    Refill:  0   With reported wheezing will try a short course of prednisone. Finish doxycycline. Will f/u with PCP as needed. OTC symptom care as needed.  Reviewed expectations re: course of current medical issues. Questions answered. Outlined signs and symptoms indicating need for more acute intervention. Patient verbalized understanding. After Visit Summary given.   SUBJECTIVE:  Pamela Roberson is a 61 y.o. female who presents with complaint of nasal congestion, post-nasal drainage, and a persistent cough. Seen by PCP earlier this week and placed on doxycycline and albuterol. Symptoms present for approximately 2-3 weeks now. Overall fatigued. SOB: none. Wheezing: moderate at times. Fever: no. Overall normal PO intake without n/v. Sick contacts: no. Wheezing at night bothering her the most.  Social History   Tobacco Use  Smoking Status Former Smoker  Smokeless Tobacco Never Used   OTC treatment: None.  ROS: As per HPI.   OBJECTIVE:  Vitals:   11/18/17 1913  BP: 130/73  Pulse: 76  Resp: 20  Temp: 98.1 F (36.7 C)  TempSrc: Oral  SpO2: 100%     General appearance: alert; no distress HEENT: nasal congestion; clear runny nose; throat irritation secondary to post-nasal drainage Neck: supple without LAD Lungs: clear to auscultation bilaterally; dry cough; no active wheezing appreciated Skin: warm and dry Psychological: alert and cooperative; normal mood and affect   Allergies  Allergen Reactions  . Crab [Shellfish Allergy] Shortness Of Breath and Swelling  . Erythromycin Swelling and Rash  . Prednisone Palpitations  . Fish Allergy    Swelling   . Metoclopramide Other (See Comments)    Slurred speech and unable to move muscles,  Caused her to drag her feet  . Levaquin [Levofloxacin In D5w] Other (See Comments)    Causes irregular heart beat  . Morphine And Related Nausea And Vomiting and Rash    Irregular heart beat  . Sulfa Antibiotics Rash    Past Medical History:  Diagnosis Date  . Arthritis   . Diabetes mellitus without complication ([place])    type II   . Diverticulitis   . Dyspnea    mild   . GERD (gastroesophageal reflux disease)   . History of bronchitis   . Hypertension   . IBS (irritable bowel syndrome)   . Palpitations   . Sleep apnea    mild but no sleep apnea   . TMJ (dislocation of temporomandibular joint)   . 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 Marital status: Widowed    Spouse name: Not on file  . Number of children: 3  . Years of education: Not on file  . Highest education level: Not on file  Social Needs  . Financial resource strain: Not on file  . Food insecurity - worry: Not on file  . Food insecurity - inability: Not on file  . Transportation needs - medical: Not on file  . Transportation needs - non-medical: Not on file  Occupational History  . Not on file  Tobacco Use  . Smoking status: Former Research scientist (life sciences)  .  Smokeless tobacco: Never Used  Substance and Sexual Activity  . Alcohol use: No  . Drug use: No  . Sexual activity: Not on file  Other Topics Concern  . Not on file  Social History Narrative  . Not on file           Vanessa Kick, MD 11/20/17 [phone]

## 2017-11-26 ENCOUNTER — Ambulatory Visit (HOSPITAL_COMMUNITY)
Admission: EM | Admit: 2017-11-26 | Discharge: 2017-11-26 | Disposition: A | Discharge status: Home / Self Care | Payer: Medicare Other | Attending: Family Medicine | Admitting: Family Medicine

## 2017-11-26 ENCOUNTER — Encounter (HOSPITAL_COMMUNITY): Payer: Self-pay | Admitting: Family Medicine

## 2017-11-26 DIAGNOSIS — J40 Bronchitis, not specified as acute or chronic: Secondary | ICD-10-CM

## 2017-11-26 DIAGNOSIS — J320 Chronic maxillary sinusitis: Secondary | ICD-10-CM

## 2017-11-26 MED ORDER — AMOXICILLIN-POT CLAVULANATE 875-125 MG PO TABS: 1.0000 | ORAL_TABLET | Freq: Two times a day (BID) | ORAL | 0 refills | Status: DC

## 2017-11-26 MED ORDER — METHYLPREDNISOLONE ACETATE 80 MG/ML IJ SUSP
INTRAMUSCULAR | Status: AC
  Filled 2017-11-26: qty 1

## 2017-11-26 MED ORDER — METHYLPREDNISOLONE ACETATE 80 MG/ML IJ SUSP
80.0000 mg | Freq: Once | INTRAMUSCULAR | Status: AC
  Administered 2017-11-26: 80 mg via INTRAMUSCULAR

## 2017-11-26 NOTE — ED Triage Notes (Signed)
Pt here for persistent cold sx .... Seen here on 11/18/17 for similar sx  See physicians note.

## 2017-11-26 NOTE — ED Provider Notes (Signed)
[place]   [id number] 11/26/17 Arrival Time: 1827   SUBJECTIVE:  Pamela Roberson is a 61 y.o. female who presents to the urgent care with complaint of upper respiratory symptoms for 2 and 1/2 months. Improved    Past Medical History:  Diagnosis Date  . Arthritis   . Diabetes mellitus without complication ([place])    type II   . Diverticulitis   . Dyspnea    mild   . GERD (gastroesophageal reflux disease)   . History of bronchitis   . Hypertension   . IBS (irritable bowel syndrome)   . Palpitations   . Sleep apnea    mild but no sleep apnea   . TMJ (dislocation of temporomandibular joint)   . 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 Social History   Socioeconomic History  . Marital status: Widowed    Spouse name: Not on file  . Number of children: 3  . Years of education: Not on file  . Highest education level: Not on file  Social Needs  . Financial resource strain: Not on file  . Food insecurity - worry: Not on file  . Food insecurity - inability: Not on file  . Transportation needs - medical: Not on file  . Transportation needs - non-medical: Not on file  Occupational History  . Not on file  Tobacco Use  . Smoking status: Former Research scientist (life sciences)  . Smokeless tobacco: Never Used  Substance and Sexual Activity  . Alcohol use: No  . Drug use: No  . Sexual activity: Not on file  Other Topics Concern  . Not on file  Social History Narrative  . Not on file   No outpatient medications have been marked as taking for the 11/26/17 encounter [facility] Encounter).   Allergies  Allergen Reactions  . Crab [Shellfish Allergy] Shortness Of Breath and Swelling  . Erythromycin Swelling and Rash  . Prednisone Palpitations  . Fish Allergy     Swelling   . Metoclopramide Other (See Comments)    Slurred speech and unable to move muscles,  Caused her to drag her feet  . Levaquin [Levofloxacin In D5w] Other (See Comments)    Causes  irregular heart beat  . Morphine And Related Nausea And Vomiting and Rash    Irregular heart beat  . Sulfa Antibiotics Rash      ROS: As per HPI, remainder of ROS negative.   OBJECTIVE:   Vitals:   11/26/17 1847  BP: 122/71  Pulse: 81  Resp: 16  Temp: 98.5 F (36.9 C)  TempSrc: Oral  SpO2: 100%     General appearance: alert; no distress Eyes: PERRL; EOMI; conjunctiva normal HENT: normocephalic; atraumatic; TMs normal, canal normal, external ears normal without trauma; nasal mucosa normal; oral mucosa normal Neck: supple Lungs: clear to auscultation bilaterally Heart: regular rate and rhythm Abdomen: soft, non-tender; bowel sounds normal; no masses or organomegaly; no guarding or rebound tenderness Back: no CVA tenderness Extremities: no cyanosis or edema; symmetrical with no gross deformities Skin: warm and dry Neurologic: normal gait; grossly normal Psychological: alert and cooperative; normal mood and affect      Labs:  Results for orders placed or performed during the hospital encounter of 07/15/17  Wet prep, genital  Result Value Ref Range   Yeast Wet Prep HPF POC NONE SEEN NONE SEEN   Trich, Wet Prep NONE SEEN NONE SEEN   Clue Cells Wet Prep HPF POC NONE  SEEN NONE SEEN   WBC, Wet Prep HPF POC MANY (A) NONE SEEN   Sperm NONE SEEN   Lipase, blood  Result Value Ref Range   Lipase 31 11 - 51 U/L  Comprehensive metabolic panel  Result Value Ref Range   Sodium 137 135 - 145 mmol/L   Potassium 4.1 3.5 - 5.1 mmol/L   Chloride 100 (L) 101 - 111 mmol/L   CO2 29 22 - 32 mmol/L   Glucose, Bld 116 (H) 65 - 99 mg/dL   BUN 11 6 - 20 mg/dL   Creatinine, Ser 1.14 (H) 0.44 - 1.00 mg/dL   Calcium 9.4 8.9 - 10.3 mg/dL   Total Protein 6.3 (L) 6.5 - 8.1 g/dL   Albumin 3.6 3.5 - 5.0 g/dL   AST 24 15 - 41 U/L   ALT 19 14 - 54 U/L   Alkaline Phosphatase 94 38 - 126 U/L   Total Bilirubin 0.5 0.3 - 1.2 mg/dL   GFR calc non Af Amer 51 (L) >60 mL/min   GFR calc Af Amer  59 (L) >60 mL/min   Anion gap 8 5 - 15  CBC  Result Value Ref Range   WBC 6.0 4.0 - 10.5 K/uL   RBC 4.86 3.87 - 5.11 MIL/uL   Hemoglobin 13.6 12.0 - 15.0 g/dL   HCT 41.9 36.0 - 46.0 %   MCV 86.2 78.0 - 100.0 fL   MCH 28.0 26.0 - 34.0 pg   MCHC 32.5 30.0 - 36.0 g/dL   RDW 13.5 11.5 - 15.5 %   Platelets 259 150 - 400 K/uL  Urinalysis, Routine w reflex microscopic  Result Value Ref Range   Color, Urine STRAW (A) YELLOW   APPearance CLEAR CLEAR   Specific Gravity, Urine 1.003 (L) 1.005 - 1.030   pH 7.0 5.0 - 8.0   Glucose, UA NEGATIVE NEGATIVE mg/dL   Hgb urine dipstick NEGATIVE NEGATIVE   Bilirubin Urine NEGATIVE NEGATIVE   Ketones, ur NEGATIVE NEGATIVE mg/dL   Protein, ur NEGATIVE NEGATIVE mg/dL   Nitrite NEGATIVE NEGATIVE   Leukocytes, UA NEGATIVE NEGATIVE    Labs Reviewed - No data to display  No results found.     ASSESSMENT & PLAN:  1. Bronchitis   2. Sinusitis, maxillary, chronic     Meds ordered this encounter  Medications  . methylPREDNISolone acetate (DEPO-MEDROL) injection 80 mg  . amoxicillin-clavulanate (AUGMENTIN) 875-125 MG tablet    Sig: Take 1 tablet by mouth every 12 (twelve) hours.    Dispense:  14 tablet    Refill:  0    Reviewed expectations re: course of current medical issues. Questions answered. Outlined signs and symptoms indicating need for more acute intervention. Patient verbalized understanding. After Visit Summary given.       Robyn Haber, MD 11/26/17 [phone]

## 2017-12-11 DIAGNOSIS — H9203 Otalgia, bilateral: Secondary | ICD-10-CM | POA: Insufficient documentation

## 2017-12-11 DIAGNOSIS — R0982 Postnasal drip: Secondary | ICD-10-CM | POA: Insufficient documentation

## 2017-12-11 DIAGNOSIS — R1313 Dysphagia, pharyngeal phase: Secondary | ICD-10-CM | POA: Insufficient documentation

## 2017-12-11 DIAGNOSIS — R42 Dizziness and giddiness: Secondary | ICD-10-CM | POA: Insufficient documentation

## 2018-01-07 ENCOUNTER — Ambulatory Visit (HOSPITAL_COMMUNITY)
Admission: EM | Admit: 2018-01-07 | Discharge: 2018-01-07 | Disposition: A | Discharge status: Home / Self Care | Payer: Medicare Other | Attending: Family Medicine | Admitting: Family Medicine

## 2018-01-07 ENCOUNTER — Encounter (HOSPITAL_COMMUNITY): Payer: Self-pay | Admitting: Family Medicine

## 2018-01-07 DIAGNOSIS — J0100 Acute maxillary sinusitis, unspecified: Secondary | ICD-10-CM

## 2018-01-07 MED ORDER — BENZONATATE 100 MG PO CAPS: 100.0000 mg | ORAL_CAPSULE | Freq: Two times a day (BID) | ORAL | 0 refills | Status: DC | PRN

## 2018-01-07 MED ORDER — AMOXICILLIN 875 MG PO TABS: 875.0000 mg | ORAL_TABLET | Freq: Two times a day (BID) | ORAL | 0 refills | Status: DC

## 2018-01-07 NOTE — ED Provider Notes (Signed)
[place]   [id number] 01/07/18 Arrival Time: 1822   SUBJECTIVE:  Pamela Roberson is a 62 y.o. female who presents to the urgent care with complaint of cold sx onset 1 week associated w/chills, cough, body aches, sneezing, dizziness, hot flashes.  Now having some bloody phlegm  No known fever.   Past Medical History:  Diagnosis Date  . Arthritis   . Diabetes mellitus without complication ([place])    type II   . Diverticulitis   . Dyspnea    mild   . GERD (gastroesophageal reflux disease)   . History of bronchitis   . Hypertension   . IBS (irritable bowel syndrome)   . Palpitations   . Sleep apnea    mild but no sleep apnea   . TMJ (dislocation of temporomandibular joint)   . 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 No pertinent family history. Social History   Socioeconomic History  . Marital status: Widowed    Spouse name: Not on file  . Number of children: 3  . Years of education: Not on file  . Highest education level: Not on file  Social Needs  . Financial resource strain: Not on file  . Food insecurity - worry: Not on file  . Food insecurity - inability: Not on file  . Transportation needs - medical: Not on file  . Transportation needs - non-medical: Not on file  Occupational History  . Not on file  Tobacco Use  . Smoking status: Former Research scientist (life sciences)  . Smokeless tobacco: Never Used  Substance and Sexual Activity  . Alcohol use: No  . Drug use: No  . Sexual activity: Not on file  Other Topics Concern  . Not on file  Social History Narrative  . Not on file   Current Meds  Medication Sig  . acyclovir (ZOVIRAX) 400 MG tablet Take 400 mg by mouth 2 (two) times daily.  Marland Kitchen albuterol (PROVENTIL HFA;VENTOLIN HFA) 108 (90 BASE) MCG/ACT inhaler Inhale 2 puffs into the lungs 2 (two) times daily.   Marland Kitchen albuterol (PROVENTIL) (2.5 MG/3ML) 0.083% nebulizer solution Take 3 mLs (2.5 mg total) by nebulization every 4 (four) hours as needed  for wheezing or shortness of breath.  Marland Kitchen aspirin EC 325 MG tablet Take 1 tablet (325 mg total) by mouth daily.  . cyclobenzaprine (FLEXERIL) 10 MG tablet Take 10 mg by mouth 3 (three) times daily as needed (TMJ).   . dicyclomine (BENTYL) 10 MG capsule Take 1 capsule (10 mg total) by mouth 3 (three) times daily as needed for spasms.  . fluticasone (FLONASE) 50 MCG/ACT nasal spray Place 2 sprays into both nostrils daily.  . Fluticasone-Salmeterol (ADVAIR) 250-50 MCG/DOSE AEPB Inhale 1 puff into the lungs 2 (two) times daily.  Marland Kitchen gabapentin (NEURONTIN) 300 MG capsule Take 300 mg by mouth 3 (three) times daily.  Marland Kitchen loratadine (CLARITIN) 10 MG tablet Take 10 mg by mouth daily.  Marland Kitchen loratadine (CLARITIN) 10 MG tablet Take 1 tablet (10 mg total) by mouth daily.  . metoprolol tartrate (LOPRESSOR) 25 MG tablet Take 25 mg by mouth 2 (two) times daily.  . montelukast (SINGULAIR) 10 MG tablet Take 10 mg by mouth every evening.   . Multiple Vitamin (MULTIVITAMIN WITH MINERALS) TABS tablet Take 1 tablet by mouth daily.  Marland Kitchen Plecanatide (TRULANCE) 3 MG TABS Take 3 mg by mouth daily. May take with or without food  . sitaGLIPtin-metformin (JANUMET) 50-500 MG tablet Take 1 tablet by mouth  daily.  . sucralfate (CARAFATE) 1 g tablet Take 1 tablet (1 g total) by mouth 4 (four) times daily -  with meals and at bedtime. (Patient taking differently: Take 1 g by mouth 4 (four) times daily. )  . traMADol (ULTRAM) 50 MG tablet Take 1 tablet (50 mg total) by mouth every 6 (six) hours as needed.   Allergies  Allergen Reactions  . Crab [Shellfish Allergy] Shortness Of Breath and Swelling  . Erythromycin Swelling and Rash  . Prednisone Palpitations  . Fish Allergy     Swelling   . Metoclopramide Other (See Comments)    Slurred speech and unable to move muscles,  Caused her to drag her feet  . Levaquin [Levofloxacin In D5w] Other (See Comments)    Causes irregular heart beat  . Morphine And Related Nausea And Vomiting and Rash     Irregular heart beat  . Sulfa Antibiotics Rash      ROS: As per HPI, remainder of ROS negative.   OBJECTIVE:   Vitals:   01/07/18 1831  BP: 138/89  Pulse: 80  Resp: 18  Temp: 98.6 F (37 C)  TempSrc: Oral     General appearance: alert; no distress Eyes: PERRL; EOMI; conjunctiva normal HENT: normocephalic; atraumatic; TMs normal, canal normal, external ears normal without trauma; nasal mucosa normal; oral mucosa normal Neck: supple Lungs: clear to auscultation bilaterally Heart: regular rate and rhythm Abdomen: soft, non-tender; bowel sounds normal; no masses or organomegaly; no guarding or rebound tenderness Back: no CVA tenderness Extremities: no cyanosis or edema; symmetrical with no gross deformities Skin: warm and dry Neurologic: normal gait; grossly normal Psychological: alert and cooperative; normal mood and affect      Labs:  Results for orders placed or performed during the hospital encounter of 07/15/17  Wet prep, genital  Result Value Ref Range   Yeast Wet Prep HPF POC NONE SEEN NONE SEEN   Trich, Wet Prep NONE SEEN NONE SEEN   Clue Cells Wet Prep HPF POC NONE SEEN NONE SEEN   WBC, Wet Prep HPF POC MANY (A) NONE SEEN   Sperm NONE SEEN   Lipase, blood  Result Value Ref Range   Lipase 31 11 - 51 U/L  Comprehensive metabolic panel  Result Value Ref Range   Sodium 137 135 - 145 mmol/L   Potassium 4.1 3.5 - 5.1 mmol/L   Chloride 100 (L) 101 - 111 mmol/L   CO2 29 22 - 32 mmol/L   Glucose, Bld 116 (H) 65 - 99 mg/dL   BUN 11 6 - 20 mg/dL   Creatinine, Ser 1.14 (H) 0.44 - 1.00 mg/dL   Calcium 9.4 8.9 - 10.3 mg/dL   Total Protein 6.3 (L) 6.5 - 8.1 g/dL   Albumin 3.6 3.5 - 5.0 g/dL   AST 24 15 - 41 U/L   ALT 19 14 - 54 U/L   Alkaline Phosphatase 94 38 - 126 U/L   Total Bilirubin 0.5 0.3 - 1.2 mg/dL   GFR calc non Af Amer 51 (L) >60 mL/min   GFR calc Af Amer 59 (L) >60 mL/min   Anion gap 8 5 - 15  CBC  Result Value Ref Range   WBC 6.0 4.0 -  10.5 K/uL   RBC 4.86 3.87 - 5.11 MIL/uL   Hemoglobin 13.6 12.0 - 15.0 g/dL   HCT 41.9 36.0 - 46.0 %   MCV 86.2 78.0 - 100.0 fL   MCH 28.0 26.0 - 34.0 pg   MCHC  32.5 30.0 - 36.0 g/dL   RDW 13.5 11.5 - 15.5 %   Platelets 259 150 - 400 K/uL  Urinalysis, Routine w reflex microscopic  Result Value Ref Range   Color, Urine STRAW (A) YELLOW   APPearance CLEAR CLEAR   Specific Gravity, Urine 1.003 (L) 1.005 - 1.030   pH 7.0 5.0 - 8.0   Glucose, UA NEGATIVE NEGATIVE mg/dL   Hgb urine dipstick NEGATIVE NEGATIVE   Bilirubin Urine NEGATIVE NEGATIVE   Ketones, ur NEGATIVE NEGATIVE mg/dL   Protein, ur NEGATIVE NEGATIVE mg/dL   Nitrite NEGATIVE NEGATIVE   Leukocytes, UA NEGATIVE NEGATIVE    Labs Reviewed - No data to display  No results found.     ASSESSMENT & PLAN:  1. Acute maxillary sinusitis, recurrence not specified     Meds ordered this encounter  Medications  . amoxicillin (AMOXIL) 875 MG tablet    Sig: Take 1 tablet (875 mg total) by mouth 2 (two) times daily.    Dispense:  20 tablet    Refill:  0  . benzonatate (TESSALON) 100 MG capsule    Sig: Take 1-2 capsules (100-200 mg total) by mouth 2 (two) times daily as needed for cough.    Dispense:  20 capsule    Refill:  0    Reviewed expectations re: course of current medical issues. Questions answered. Outlined signs and symptoms indicating need for more acute intervention. Patient verbalized understanding. After Visit Summary given.    Procedures:      Robyn Haber, MD 01/07/18 1842

## 2018-01-07 NOTE — ED Triage Notes (Signed)
PT C/O: cold sx onset 1 week associated w/chills, cough, body aches, sneezing, dizziness, hot flashes  DENIES: fevers  TAKING MEDS: none   A&O x4... NAD... Ambulatory

## 2018-03-26 ENCOUNTER — Encounter (HOSPITAL_COMMUNITY): Payer: Self-pay

## 2018-03-26 ENCOUNTER — Other Ambulatory Visit: Payer: Self-pay

## 2018-03-26 ENCOUNTER — Ambulatory Visit (HOSPITAL_COMMUNITY)
Admission: EM | Admit: 2018-03-26 | Discharge: 2018-03-26 | Disposition: A | Discharge status: Home / Self Care | Payer: Medicare Other | Attending: Family Medicine | Admitting: Family Medicine

## 2018-03-26 DIAGNOSIS — J40 Bronchitis, not specified as acute or chronic: Secondary | ICD-10-CM

## 2018-03-26 MED ORDER — PREDNISONE 10 MG (21) PO TBPK: ORAL_TABLET | Freq: Every day | ORAL | 0 refills | Status: DC

## 2018-03-26 MED ORDER — DOXYCYCLINE HYCLATE 100 MG PO CAPS: 100.0000 mg | ORAL_CAPSULE | Freq: Two times a day (BID) | ORAL | 0 refills | Status: DC

## 2018-03-26 MED ORDER — CETIRIZINE HCL 10 MG PO TABS
10.0000 mg | ORAL_TABLET | Freq: Every day | ORAL | 0 refills | Status: DC
Start: 2018-03-26 — End: 2019-03-11

## 2018-03-26 NOTE — Discharge Instructions (Signed)
Start doxycycline and prednisone for bronchitis. You can switch Claritin to Zyrtec and see if it will help with your allergy symptoms.  Lid scrubs and warm compress as discussed.  Follow-up with PCP for reevaluation needed. Monitor for any worsening of symptoms, chest pain, shortness of breath, wheezing, swelling of the throat, follow up for reevaluation.

## 2018-03-26 NOTE — ED Provider Notes (Signed)
[place]    CSN: [id number] Arrival date & time: 03/26/18  7425     History   Chief Complaint Chief Complaint  Patient presents with  . Cough    HPI Pamela Roberson is a 62 y.o. female.   62 year old female comes in for 3-week history of cough.  States it is nonproductive, and has some pain with coughing.  Also has nasal congestion, rhinorrhea, sore throat.  Has had eye watering and itching with foreign body sensation.  Denies injury to the eye.  Denies shortness of breath, wheezing.  Former smoker.     Past Medical History:  Diagnosis Date  . Arthritis   . Diabetes mellitus without complication ([place])    type II   . Diverticulitis   . Dyspnea    mild   . GERD (gastroesophageal reflux disease)   . History of bronchitis   . Hypertension   . IBS (irritable bowel syndrome)   . Palpitations   . Sleep apnea    mild but no sleep apnea   . TMJ (dislocation of temporomandibular joint)   . 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 Biceps tendinitis of right shoulder 04/05/2017  . Impingement syndrome of right shoulder 04/05/2017  . Arthritis of right acromioclavicular joint 04/05/2017  . Partial nontraumatic tear of rotator cuff, right 04/05/2017    Past Surgical History:  Procedure Laterality Date  . ABDOMINAL HYSTERECTOMY    . ESOPHAGOGASTRODUODENOSCOPY ENDOSCOPY     with esophagus being stretched twice per pt,   . EYE SURGERY    . 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 feeding tube placement and removal   2015  . NASAL SEPTUM SURGERY    . spurs removed from esophagus   2015  . TONSILLECTOMY      OB History   None      Home Medications    Prior to Admission medications   Medication Sig Start Date End Date Taking? Authorizing Provider  acyclovir (ZOVIRAX) 400 MG tablet Take 400 mg by mouth 2 (two) times daily.   Yes [provider]  albuterol (PROVENTIL HFA;VENTOLIN  HFA) 108 (90 BASE) MCG/ACT inhaler Inhale 2 puffs into the lungs 2 (two) times daily.    Yes [provider]  albuterol (PROVENTIL) (2.5 MG/3ML) 0.083% nebulizer solution Take 3 mLs (2.5 mg total) by nebulization every 4 (four) hours as needed for wheezing or shortness of breath. 05/01/17  Yes Charlesetta Shanks, MD  aspirin EC 325 MG tablet Take 1 tablet (325 mg total) by mouth daily. 04/05/17  Yes Nicholes Stairs, MD  dicyclomine (BENTYL) 10 MG capsule Take 1 capsule (10 mg total) by mouth 3 (three) times daily as needed for spasms. 10/21/16  Yes Nandigam, Venia Minks, MD  fluticasone (FLONASE) 50 MCG/ACT nasal spray Place 2 sprays into both nostrils daily. 05/01/17  Yes Charlesetta Shanks, MD  Fluticasone-Salmeterol (ADVAIR) 250-50 MCG/DOSE AEPB Inhale 1 puff into the lungs 2 (two) times daily.   Yes [provider]  gabapentin (NEURONTIN) 300 MG capsule Take 300 mg by mouth 3 (three) times daily.   Yes [provider]  metoprolol tartrate (LOPRESSOR) 25 MG tablet Take 25 mg by mouth 2 (two) times daily.   Yes [provider]  montelukast (SINGULAIR) 10 MG tablet Take 10 mg by mouth every evening.    Yes [provider]  Multiple  Vitamin (MULTIVITAMIN WITH MINERALS) TABS tablet Take 1 tablet by mouth daily.   Yes [provider]  Plecanatide (TRULANCE) 3 MG TABS Take 3 mg by mouth daily. May take with or without food 11/17/16  Yes Nandigam, Venia Minks, MD  cetirizine (ZYRTEC) 10 MG tablet Take 1 tablet (10 mg total) by mouth daily. 03/26/18   Tasia Catchings, Kayra Crowell V, PA-C  doxycycline (VIBRAMYCIN) 100 MG capsule Take 1 capsule (100 mg total) by mouth 2 (two) times daily. 03/26/18   Tasia Catchings, Viet Kemmerer V, PA-C  EPINEPHrine 0.3 mg/0.3 mL IJ SOAJ injection Inject 0.3 mg into the muscle once as needed (allergic reactions).     [provider]  omeprazole (PRILOSEC) 40 MG capsule Take 1 capsule (40 mg total) by mouth 2 (two) times daily before a meal. 11/17/16 07/15/17   Nandigam, Venia Minks, MD  predniSONE (STERAPRED UNI-PAK 21 TAB) 10 MG (21) TBPK tablet Take by mouth daily. Take 6 tabs by mouth day 1, then 5 tabs, then 4 tabs, then 3 tabs, 2 tabs, then 1 tab for the last day 03/26/18   Ok Edwards, PA-C  sitaGLIPtin-metformin (JANUMET) 50-500 MG tablet Take 1 tablet by mouth daily.    [provider]  sucralfate (CARAFATE) 1 g tablet Take 1 tablet (1 g total) by mouth 4 (four) times daily -  with meals and at bedtime. Patient taking differently: Take 1 g by mouth 4 (four) times daily.  01/16/17   Mauri Pole, MD    Family History Family History  Problem Relation Age of Onset  . Diabetes Father     Social History Social History   Tobacco Use  . Smoking status: Former Research scientist (life sciences)  . Smokeless tobacco: Never Used  Substance Use Topics  . Alcohol use: No  . Drug use: No     Allergies   Crab [shellfish allergy]; Erythromycin; Fish allergy; Metoclopramide; Levaquin [levofloxacin in d5w]; Morphine and related; and Sulfa antibiotics   Review of Systems Review of Systems  Reason unable to perform ROS: See HPI as above.     Physical Exam Triage Vital Signs ED Triage Vitals  Enc Vitals Group     BP 03/26/18 1944 (!) 155/98     Pulse Rate 03/26/18 1944 89     Resp 03/26/18 1944 19     Temp 03/26/18 1944 98.6 F (37 C)     Temp src --      SpO2 03/26/18 1944 100 %     Weight 03/26/18 1940 210 lb (95.3 kg)     Height --      Head Circumference --      Peak Flow --      Pain Score 03/26/18 1940 5     Pain Loc --      Pain Edu? --      Excl. in [place]? --    No data found.  Updated Vital Signs BP (!) 155/98   Pulse 89   Temp 98.6 F (37 C)   Resp 19   Wt 210 lb (95.3 kg)   SpO2 100%   BMI 32.89 kg/m     Physical Exam  Constitutional: She is oriented to person, place, and time. She appears well-developed and well-nourished. No distress.  HENT:  Head: Normocephalic and atraumatic.  Right Ear: Tympanic membrane, external  ear and ear canal normal. Tympanic membrane is not erythematous and not bulging.  Left Ear: Tympanic membrane, external ear and ear canal normal. Tympanic membrane is not erythematous and  not bulging.  Nose: Nose normal. Right sinus exhibits no maxillary sinus tenderness and no frontal sinus tenderness. Left sinus exhibits no maxillary sinus tenderness and no frontal sinus tenderness.  Mouth/Throat: Uvula is midline, oropharynx is clear and moist and mucous membranes are normal.  Eyes: Pupils are equal, round, and reactive to light. Conjunctivae are normal.  Neck: Normal range of motion. Neck supple.  Cardiovascular: Normal rate, regular rhythm and normal heart sounds. Exam reveals no gallop and no friction rub.  No murmur heard. Pulmonary/Chest: Effort normal and breath sounds normal. No accessory muscle usage or stridor. No respiratory distress. She has no decreased breath sounds. She has no wheezes. She has no rhonchi. She has no rales.  Lymphadenopathy:    She has no cervical adenopathy.  Neurological: She is alert and oriented to person, place, and time.  Skin: Skin is warm and dry.  Psychiatric: She has a normal mood and affect. Her behavior is normal. Judgment normal.     UC Treatments / Results  Labs (all labs ordered are listed, but only abnormal results are displayed) Labs Reviewed - No data to display  EKG None Radiology No results found.  Procedures Procedures (including critical care time)  Medications Ordered in UC Medications - No data to display   Initial Impression / Assessment and Plan / UC Course  I have reviewed the triage vital signs and the nursing notes.  Pertinent labs & imaging results that were available during my care of the patient were reviewed by me and considered in my medical decision making (see chart for details).    Start prednisone and doxycycline for bronchitis.  Patient with history of diabetes, has been on prednisone before and has  monitored blood sugar without problems.  Will have patient monitor closely for blood sugar.  Other symptomatic treatment discussed.  Return precautions given.  Patient expresses understanding and agrees to plan.  Final Clinical Impressions(s) / UC Diagnoses   Final diagnoses:  Bronchitis    ED Discharge Orders        Ordered    cetirizine (ZYRTEC) 10 MG tablet  Daily     03/26/18 2014    doxycycline (VIBRAMYCIN) 100 MG capsule  2 times daily     03/26/18 2014    predniSONE (STERAPRED UNI-PAK 21 TAB) 10 MG (21) TBPK tablet  Daily     03/26/18 2014        Ok Edwards, PA-C 03/26/18 2018

## 2018-03-26 NOTE — ED Triage Notes (Signed)
Pt presents with nasal congestion, eye irritation, sore throat, dry cough and pain with cough x 3 weeks.

## 2018-05-24 ENCOUNTER — Encounter (HOSPITAL_COMMUNITY): Payer: Self-pay | Admitting: Family Medicine

## 2018-05-24 ENCOUNTER — Ambulatory Visit (HOSPITAL_COMMUNITY)
Admission: EM | Admit: 2018-05-24 | Discharge: 2018-05-24 | Disposition: A | Discharge status: Home / Self Care | Payer: Medicare Other | Attending: Family Medicine | Admitting: Family Medicine

## 2018-05-24 DIAGNOSIS — J0100 Acute maxillary sinusitis, unspecified: Secondary | ICD-10-CM | POA: Diagnosis not present

## 2018-05-24 DIAGNOSIS — H9202 Otalgia, left ear: Secondary | ICD-10-CM

## 2018-05-24 DIAGNOSIS — R110 Nausea: Secondary | ICD-10-CM

## 2018-05-24 DIAGNOSIS — R51 Headache: Secondary | ICD-10-CM

## 2018-05-24 MED ORDER — ONDANSETRON 4 MG PO TBDP
ORAL_TABLET | ORAL | Status: AC
  Filled 2018-05-24: qty 1

## 2018-05-24 MED ORDER — ONDANSETRON 4 MG PO TBDP: 4.0000 mg | ORAL_TABLET | Freq: Three times a day (TID) | ORAL | 0 refills | Status: DC | PRN

## 2018-05-24 MED ORDER — KETOROLAC TROMETHAMINE 30 MG/ML IJ SOLN
30.0000 mg | Freq: Once | INTRAMUSCULAR | Status: AC
  Administered 2018-05-24: 30 mg via INTRAMUSCULAR

## 2018-05-24 MED ORDER — KETOROLAC TROMETHAMINE 30 MG/ML IJ SOLN
INTRAMUSCULAR | Status: AC
  Filled 2018-05-24: qty 1

## 2018-05-24 MED ORDER — AMOXICILLIN-POT CLAVULANATE 875-125 MG PO TABS: 1.0000 | ORAL_TABLET | Freq: Two times a day (BID) | ORAL | 0 refills | Status: AC

## 2018-05-24 MED ORDER — ONDANSETRON 4 MG PO TBDP
4.0000 mg | ORAL_TABLET | Freq: Once | ORAL | Status: AC
  Administered 2018-05-24: 4 mg via ORAL

## 2018-05-24 NOTE — ED Triage Notes (Addendum)
Pt here for cough since last week. sts some thick mucous that would get stuck in her throat. sts also headache since last week in her eyes and temples. Reports some nausea and dizziness. She has been taking tylenol for the headache.

## 2018-05-24 NOTE — Discharge Instructions (Signed)
Push fluids to ensure adequate hydration and keep secretions thin.  Tylenol and/or ibuprofen as needed for pain or fevers.   Complete course of antibiotics.   If symptoms worsen or do not improve in the next week to return to be seen or to follow up with your PCP.

## 2018-05-24 NOTE — ED Provider Notes (Signed)
[place]    CSN: [id number] Arrival date & time: 05/24/18  1919     History   Chief Complaint Chief Complaint  Patient presents with  . Cough  . Headache    HPI Pamela Roberson is a 62 y.o. female.   Pamela Roberson presents with complaints of congestion, facial pressure , headache, nausea, left ear pain. Started last week and has persisted. Vomited last week. No sore throat. Cough is productive of phlegm. Tylenol briefly helps. No fever. No shortness of breath . No chest pain . States quick head motions makes her feel dizzy at times. Hx dm, ibs, tmj, htn, dyspnea, gerd.     ROS per HPI.      Past Medical History:  Diagnosis Date  . Arthritis   . Diabetes mellitus without complication ([place])    type II   . Diverticulitis   . Dyspnea    mild   . GERD (gastroesophageal reflux disease)   . History of bronchitis   . Hypertension   . IBS (irritable bowel syndrome)   . Palpitations   . Sleep apnea    mild but no sleep apnea   . TMJ (dislocation of temporomandibular joint)   . 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 Biceps tendinitis of right shoulder 04/05/2017  . Impingement syndrome of right shoulder 04/05/2017  . Arthritis of right acromioclavicular joint 04/05/2017  . Partial nontraumatic tear of rotator cuff, right 04/05/2017    Past Surgical History:  Procedure Laterality Date  . ABDOMINAL HYSTERECTOMY    . ESOPHAGOGASTRODUODENOSCOPY ENDOSCOPY     with esophagus being stretched twice per pt,   . EYE SURGERY    . 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 feeding tube placement and removal   2015  . NASAL SEPTUM SURGERY    . spurs removed from esophagus   2015  . TONSILLECTOMY      OB History   None      Home Medications    Prior to Admission medications   Medication Sig Start Date End Date Taking? Authorizing Provider  acyclovir (ZOVIRAX) 400 MG tablet Take 400 mg by mouth  2 (two) times daily.    [provider]  albuterol (PROVENTIL HFA;VENTOLIN HFA) 108 (90 BASE) MCG/ACT inhaler Inhale 2 puffs into the lungs 2 (two) times daily.     [provider]  albuterol (PROVENTIL) (2.5 MG/3ML) 0.083% nebulizer solution Take 3 mLs (2.5 mg total) by nebulization every 4 (four) hours as needed for wheezing or shortness of breath. 05/01/17   Charlesetta Shanks, MD  amoxicillin-clavulanate (AUGMENTIN) 875-125 MG tablet Take 1 tablet by mouth every 12 (twelve) hours for 10 days. 05/24/18 06/03/18  Zigmund Gottron, NP  aspirin EC 325 MG tablet Take 1 tablet (325 mg total) by mouth daily. 04/05/17   Nicholes Stairs, MD  cetirizine (ZYRTEC) 10 MG tablet Take 1 tablet (10 mg total) by mouth daily. 03/26/18   Tasia Catchings, Amy V, PA-C  EPINEPHrine 0.3 mg/0.3 mL IJ SOAJ injection Inject 0.3 mg into the muscle once as needed (allergic reactions).     [provider]  fluticasone (FLONASE) 50 MCG/ACT nasal spray Place 2 sprays into both nostrils daily. 05/01/17   Charlesetta Shanks, MD  Fluticasone-Salmeterol (ADVAIR) 250-50 MCG/DOSE AEPB Inhale 1 puff into the lungs 2 (two) times daily.    [provider]  gabapentin (  NEURONTIN) 300 MG capsule Take 300 mg by mouth 3 (three) times daily.    [provider]  metoprolol tartrate (LOPRESSOR) 25 MG tablet Take 25 mg by mouth 2 (two) times daily.    [provider]  montelukast (SINGULAIR) 10 MG tablet Take 10 mg by mouth every evening.     [provider]  Multiple Vitamin (MULTIVITAMIN WITH MINERALS) TABS tablet Take 1 tablet by mouth daily.    [provider]  omeprazole (PRILOSEC) 40 MG capsule Take 1 capsule (40 mg total) by mouth 2 (two) times daily before a meal. 11/17/16 07/15/17  Nandigam, Venia Minks, MD  ondansetron (ZOFRAN-ODT) 4 MG disintegrating tablet Take 1 tablet (4 mg total) by mouth every 8 (eight) hours as needed for nausea or vomiting. 05/24/18   Zigmund Gottron, NP    sitaGLIPtin-metformin (JANUMET) 50-500 MG tablet Take 1 tablet by mouth daily.    [provider]    Family History Family History  Problem Relation Age of Onset  . Diabetes Father     Social History Social History   Tobacco Use  . Smoking status: Former Research scientist (life sciences)  . Smokeless tobacco: Never Used  Substance Use Topics  . Alcohol use: No  . Drug use: No     Allergies   Crab [shellfish allergy]; Erythromycin; Fish allergy; Metoclopramide; Levaquin [levofloxacin in d5w]; Morphine and related; and Sulfa antibiotics   Review of Systems Review of Systems   Physical Exam Triage Vital Signs ED Triage Vitals  Enc Vitals Group     BP 05/24/18 1934 136/88     Pulse Rate 05/24/18 1934 73     Resp 05/24/18 1934 18     Temp 05/24/18 1934 98.2 F (36.8 C)     Temp src --      SpO2 05/24/18 1934 98 %     Weight --      Height --      Head Circumference --      Peak Flow --      Pain Score 05/24/18 1931 9     Pain Loc --      Pain Edu? --      Excl. in [place]? --    No data found.  Updated Vital Signs BP 136/88   Pulse 73   Temp 98.2 F (36.8 C)   Resp 18   SpO2 98%    Physical Exam  Constitutional: She is oriented to person, place, and time. She appears well-developed and well-nourished. No distress.  HENT:  Head: Normocephalic and atraumatic.  Right Ear: External ear and ear canal normal. A middle ear effusion is present.  Left Ear: Tympanic membrane, external ear and ear canal normal.  Nose: Right sinus exhibits maxillary sinus tenderness. Left sinus exhibits maxillary sinus tenderness.  Mouth/Throat: Uvula is midline, oropharynx is clear and moist and mucous membranes are normal. Tonsils are 0 on the right. Tonsils are 0 on the left. No tonsillar exudate.  Eyes: Pupils are equal, round, and reactive to light. Conjunctivae and EOM are normal.  Cardiovascular: Normal rate, regular rhythm and normal heart sounds.  Pulmonary/Chest: Effort normal and breath  sounds normal.  Neurological: She is alert and oriented to person, place, and time. She is not disoriented. No cranial nerve deficit or sensory deficit.  Skin: Skin is warm and dry.     UC Treatments / Results  Labs (all labs ordered are listed, but only abnormal results are displayed) Labs Reviewed - No data to display  EKG None  Radiology No results found.  Procedures Procedures (including critical care time)  Medications Ordered in UC Medications  ketorolac (TORADOL) 30 MG/ML injection 30 mg (has no administration in time range)  ondansetron (ZOFRAN-ODT) disintegrating tablet 4 mg (has no administration in time range)    Initial Impression / Assessment and Plan / UC Course  I have reviewed the triage vital signs and the nursing notes.  Pertinent labs & imaging results that were available during my care of the patient were reviewed by me and considered in my medical decision making (see chart for details).     zofran prn nausea. Course of augmentin initiated. Return precautions provided. Patient verbalized understanding and agreeable to plan.  Ambulatory out of clinic without difficulty.    Final Clinical Impressions(s) / UC Diagnoses   Final diagnoses:  Acute maxillary sinusitis, recurrence not specified     Discharge Instructions     Push fluids to ensure adequate hydration and keep secretions thin.  Tylenol and/or ibuprofen as needed for pain or fevers.   Complete course of antibiotics.   If symptoms worsen or do not improve in the next week to return to be seen or to follow up with your PCP.     ED Prescriptions    Medication Sig Dispense Auth. Provider   amoxicillin-clavulanate (AUGMENTIN) 875-125 MG tablet Take 1 tablet by mouth every 12 (twelve) hours for 10 days. 20 tablet Augusto Gamble B, NP   ondansetron (ZOFRAN-ODT) 4 MG disintegrating tablet Take 1 tablet (4 mg total) by mouth every 8 (eight) hours as needed for nausea or vomiting. 12 tablet Zigmund Gottron, NP     Controlled Substance Prescriptions [place] Controlled Substance Registry consulted? Not Applicable   Zigmund Gottron, NP 05/24/18 2023

## 2018-06-08 ENCOUNTER — Encounter (HOSPITAL_COMMUNITY): Payer: Self-pay | Admitting: Emergency Medicine

## 2018-06-08 ENCOUNTER — Ambulatory Visit (HOSPITAL_COMMUNITY)
Admission: EM | Admit: 2018-06-08 | Discharge: 2018-06-08 | Disposition: A | Discharge status: Home / Self Care | Payer: Medicare Other | Attending: Family Medicine | Admitting: Family Medicine

## 2018-06-08 ENCOUNTER — Telehealth (HOSPITAL_COMMUNITY): Payer: Self-pay | Admitting: Emergency Medicine

## 2018-06-08 DIAGNOSIS — Z9109 Other allergy status, other than to drugs and biological substances: Secondary | ICD-10-CM

## 2018-06-08 DIAGNOSIS — R079 Chest pain, unspecified: Secondary | ICD-10-CM

## 2018-06-08 DIAGNOSIS — K219 Gastro-esophageal reflux disease without esophagitis: Secondary | ICD-10-CM | POA: Diagnosis not present

## 2018-06-08 DIAGNOSIS — R05 Cough: Secondary | ICD-10-CM

## 2018-06-08 DIAGNOSIS — R059 Cough, unspecified: Secondary | ICD-10-CM

## 2018-06-08 MED ORDER — FLUTICASONE PROPIONATE 50 MCG/ACT NA SUSP: 2.0000 | Freq: Every day | NASAL | 0 refills | Status: DC

## 2018-06-08 MED ORDER — PROMETHAZINE-DM 6.25-15 MG/5ML PO SYRP: 5.0000 mL | ORAL_SOLUTION | Freq: Four times a day (QID) | ORAL | 0 refills | Status: DC | PRN

## 2018-06-08 MED ORDER — RANITIDINE HCL 150 MG PO TABS: 150.0000 mg | ORAL_TABLET | Freq: Two times a day (BID) | ORAL | 0 refills | Status: DC

## 2018-06-08 NOTE — ED Triage Notes (Signed)
Pt c/o epigastric pain, and L sided chest pain x4 days, pt is tender to palpation on the L side, pt also states when she eats food she can feel the pain.

## 2018-06-08 NOTE — ED Provider Notes (Signed)
[place]    CSN: [id number] Arrival date & time: 06/08/18  1411     History   Chief Complaint Chief Complaint  Patient presents with  . Chest Pain    HPI Pamela Roberson is a 62 y.o. female.   HPI  Patient was here 2 weeks ago.  She took a course of Augmentin.  She was told to come back if not better.  She states she has some epigastric pain and heartburn.  She states that she still has coughing.  She has postnasal drip.  The postnasal drip is thick.  She is not coughing up any phlegm or mucus.  She has pain in her epigastrium and lower chest.  It comes and goes.  Does not seem to be related to food.  She states her primary care doctor reduced her omeprazole from twice a day to once a day.  Her heartburn is worse on the new dose.  She has known environmental allergies.  She has not taking her allergy pill because it causes drowsiness.  She is not taking her Flonase because she ran out.  She did not get Tessalon for coughing because it is not covered under her insurance. She states she is eating well and sleeping well.  The chest pain is not exertional.  It seems more related to her acid reflux.  When she has chest pain is a burning sensation.  No pressure.  No diaphoresis, no shortness of breath, no palpitations.  Past Medical History:  Diagnosis Date  . Arthritis   . Diabetes mellitus without complication ([place])    type II   . Diverticulitis   . Dyspnea    mild   . GERD (gastroesophageal reflux disease)   . History of bronchitis   . Hypertension   . IBS (irritable bowel syndrome)   . Palpitations   . Sleep apnea    mild but no sleep apnea   . TMJ (dislocation of temporomandibular joint)   . 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 Biceps tendinitis of right shoulder 04/05/2017  . Impingement syndrome of right shoulder 04/05/2017  . Arthritis of right acromioclavicular joint 04/05/2017  . Partial  nontraumatic tear of rotator cuff, right 04/05/2017    Past Surgical History:  Procedure Laterality Date  . ABDOMINAL HYSTERECTOMY    . ESOPHAGOGASTRODUODENOSCOPY ENDOSCOPY     with esophagus being stretched twice per pt,   . EYE SURGERY    . 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 feeding tube placement and removal   2015  . NASAL SEPTUM SURGERY    . spurs removed from esophagus   2015  . TONSILLECTOMY      OB History   None      Home Medications    Prior to Admission medications   Medication Sig Start Date End Date Taking? Authorizing Provider  acyclovir (ZOVIRAX) 400 MG tablet Take 400 mg by mouth 2 (two) times daily.    [provider]  albuterol (PROVENTIL HFA;VENTOLIN HFA) 108 (90 BASE) MCG/ACT inhaler Inhale 2 puffs into the lungs 2 (two) times daily.     [provider]  albuterol (PROVENTIL) (2.5 MG/3ML) 0.083% nebulizer solution Take 3 mLs (2.5 mg total) by nebulization every 4 (four) hours as needed for wheezing or shortness of breath. 05/01/17   Charlesetta Shanks, MD  aspirin EC 325 MG tablet Take  1 tablet (325 mg total) by mouth daily. 04/05/17   Nicholes Stairs, MD  cetirizine (ZYRTEC) 10 MG tablet Take 1 tablet (10 mg total) by mouth daily. 03/26/18   Tasia Catchings, Amy V, PA-C  EPINEPHrine 0.3 mg/0.3 mL IJ SOAJ injection Inject 0.3 mg into the muscle once as needed (allergic reactions).     [provider]  fluticasone (FLONASE) 50 MCG/ACT nasal spray Place 2 sprays into both nostrils daily. 06/08/18   Raylene Everts, MD  Fluticasone-Salmeterol (ADVAIR) 250-50 MCG/DOSE AEPB Inhale 1 puff into the lungs 2 (two) times daily.    [provider]  gabapentin (NEURONTIN) 300 MG capsule Take 300 mg by mouth 3 (three) times daily.    [provider]  metoprolol tartrate (LOPRESSOR) 25 MG tablet Take 25 mg by mouth 2 (two) times daily.    [provider]  montelukast (SINGULAIR) 10 MG tablet Take 10 mg by mouth every evening.      [provider]  Multiple Vitamin (MULTIVITAMIN WITH MINERALS) TABS tablet Take 1 tablet by mouth daily.    [provider]  omeprazole (PRILOSEC) 40 MG capsule Take 1 capsule (40 mg total) by mouth 2 (two) times daily before a meal. 11/17/16 07/15/17  Nandigam, Venia Minks, MD  ondansetron (ZOFRAN-ODT) 4 MG disintegrating tablet Take 1 tablet (4 mg total) by mouth every 8 (eight) hours as needed for nausea or vomiting. 05/24/18   Zigmund Gottron, NP  promethazine-dextromethorphan (PROMETHAZINE-DM) 6.25-15 MG/5ML syrup Take 5 mLs by mouth 4 (four) times daily as needed for cough. 06/08/18   Raylene Everts, MD  ranitidine (ZANTAC) 150 MG tablet Take 1 tablet (150 mg total) by mouth 2 (two) times daily. 06/08/18   Raylene Everts, MD  sitaGLIPtin-metformin (JANUMET) 50-500 MG tablet Take 1 tablet by mouth daily.    [provider]    Family History Family History  Problem Relation Age of Onset  . Diabetes Father     Social History Social History   Tobacco Use  . Smoking status: Former Research scientist (life sciences)  . Smokeless tobacco: Never Used  Substance Use Topics  . Alcohol use: No  . Drug use: No     Allergies   Crab [shellfish allergy]; Erythromycin; Fish allergy; Metoclopramide; Levaquin [levofloxacin in d5w]; Morphine and related; and Sulfa antibiotics   Review of Systems Review of Systems  Constitutional: Negative for chills and fever.  HENT: Positive for congestion, dental problem, postnasal drip and rhinorrhea. Negative for ear pain and sore throat.   Eyes: Negative for pain and visual disturbance.  Respiratory: Positive for cough. Negative for shortness of breath.   Cardiovascular: Positive for chest pain. Negative for palpitations.  Gastrointestinal: Negative for abdominal pain and vomiting.  Genitourinary: Negative for dysuria and hematuria.  Musculoskeletal: Negative for arthralgias and back pain.  Skin: Negative for color change and rash.  Neurological:  Positive for headaches. Negative for seizures and syncope.  All other systems reviewed and are negative.    Physical Exam Triage Vital Signs ED Triage Vitals [06/08/18 1450]  Enc Vitals Group     BP 122/73     Pulse Rate 90     Resp 18     Temp 97.9 F (36.6 C)     Temp src      SpO2 99 %     Weight      Height      Head Circumference      Peak Flow      Pain  Score 6     Pain Loc      Pain Edu?      Excl. in [place]?    No data found.  Updated Vital Signs BP 122/73   Pulse 90   Temp 97.9 F (36.6 C)   Resp 18   SpO2 99%   Visual Acuity Right Eye Distance:   Left Eye Distance:   Bilateral Distance:    Right Eye Near:   Left Eye Near:    Bilateral Near:     Physical Exam  Constitutional: She appears well-developed and well-nourished. No distress.  HENT:  Head: Normocephalic and atraumatic.  Mouth/Throat: Oropharynx is clear and moist.  No sinus tenderness.  Nasal membranes are pink.  No swelling.  Posterior pharynx mildly inflamed.  No PND identified.  Lungs are clear to auscultation.  No chest wall tenderness.  Eyes: Pupils are equal, round, and reactive to light. Conjunctivae are normal.  Neck: Normal range of motion. Neck supple.  Cardiovascular: Normal rate and regular rhythm.  EKG is normal  Pulmonary/Chest: Effort normal and breath sounds normal. No respiratory distress.  Abdominal: Soft. Bowel sounds are normal. She exhibits no distension.  Mild midepigastric tenderness.  No guarding or rebound  Musculoskeletal: Normal range of motion. She exhibits no edema.       Right lower leg: Normal.       Left lower leg: Normal.  Lymphadenopathy:    She has no cervical adenopathy.  Neurological: She is alert.  Skin: Skin is warm and dry.     UC Treatments / Results  Labs (all labs ordered are listed, but only abnormal results are displayed) Labs Reviewed - No data to display  EKG None  Radiology No results found.  Procedures Procedures (including  critical care time)  Medications Ordered in UC Medications - No data to display  Initial Impression / Assessment and Plan / UC Course  I have reviewed the triage vital signs and the nursing notes.  Pertinent labs & imaging results that were available during my care of the patient were reviewed by me and considered in my medical decision making (see chart for details).      Final Clinical Impressions(s) / UC Diagnoses   Final diagnoses:  Gastroesophageal reflux disease, esophagitis presence not specified  Chest pain, unspecified type  Cough  Environmental allergies     Discharge Instructions     Try cutting the cetirizine in half and taking 5 mg a day Usual Flonase every day Use the cough medicine as needed I have prescribed ranitidine to take 1 pill twice a day.  This should help reduce your heartburn. Get medicine refills through your primary care doctor.   ED Prescriptions    Medication Sig Dispense Auth. Provider   ranitidine (ZANTAC) 150 MG tablet Take 1 tablet (150 mg total) by mouth 2 (two) times daily. 60 tablet Raylene Everts, MD   fluticasone [facility]) 50 MCG/ACT nasal spray Place 2 sprays into both nostrils daily. 16 g Raylene Everts, MD   promethazine-dextromethorphan (PROMETHAZINE-DM) 6.25-15 MG/5ML syrup Take 5 mLs by mouth 4 (four) times daily as needed for cough. 240 mL Raylene Everts, MD     Controlled Substance Prescriptions [place] Controlled Substance Registry consulted? Not Applicable   Raylene Everts, MD 06/08/18 1600

## 2018-06-08 NOTE — Discharge Instructions (Addendum)
Try cutting the cetirizine in half and taking 5 mg a day Usual Flonase every day Use the cough medicine as needed I have prescribed ranitidine to take 1 pill twice a day.  This should help reduce your heartburn. Get medicine refills through your primary care doctor.

## 2018-06-08 NOTE — Telephone Encounter (Signed)
Patient requested that her medications be resent to the [facility] on [address].

## 2018-08-03 ENCOUNTER — Other Ambulatory Visit: Payer: Self-pay | Admitting: Family Medicine

## 2018-08-03 DIAGNOSIS — Z1231 Encounter for screening mammogram for malignant neoplasm of breast: Secondary | ICD-10-CM

## 2018-08-17 ENCOUNTER — Encounter (HOSPITAL_COMMUNITY): Payer: Self-pay | Admitting: Emergency Medicine

## 2018-08-17 ENCOUNTER — Ambulatory Visit (HOSPITAL_COMMUNITY)
Admission: EM | Admit: 2018-08-17 | Discharge: 2018-08-17 | Disposition: A | Discharge status: Home / Self Care | Payer: Medicare Other | Attending: Emergency Medicine | Admitting: Emergency Medicine

## 2018-08-17 DIAGNOSIS — J0110 Acute frontal sinusitis, unspecified: Secondary | ICD-10-CM

## 2018-08-17 DIAGNOSIS — H109 Unspecified conjunctivitis: Secondary | ICD-10-CM | POA: Diagnosis not present

## 2018-08-17 MED ORDER — POLYMYXIN B-TRIMETHOPRIM 10000-0.1 UNIT/ML-% OP SOLN: 1.0000 [drp] | OPHTHALMIC | 0 refills | Status: AC

## 2018-08-17 MED ORDER — AMOXICILLIN-POT CLAVULANATE 875-125 MG PO TABS: 1.0000 | ORAL_TABLET | Freq: Two times a day (BID) | ORAL | 0 refills | Status: DC

## 2018-08-17 MED ORDER — PROMETHAZINE-DM 6.25-15 MG/5ML PO SYRP: 5.0000 mL | ORAL_SOLUTION | Freq: Four times a day (QID) | ORAL | 0 refills | Status: DC | PRN

## 2018-08-17 NOTE — ED Triage Notes (Signed)
Pt sts URI sx and eye irritation

## 2018-08-17 NOTE — ED Provider Notes (Signed)
[facility]    CSN: [id number] Arrival date & time: 08/17/18  1044     History   Chief Complaint Chief Complaint  Patient presents with  . URI  . Eye Pain    HPI Pamela Roberson is a 62 y.o. female.   The history is provided by the patient.  URI  Presenting symptoms: congestion, cough, facial pain and sore throat   Presenting symptoms: no ear pain  Fever: subjective.   Severity:  Moderate Onset quality:  Gradual Duration:  2 weeks Timing:  Constant Progression:  Waxing and waning Chronicity:  New Worsened by:  Nothing Ineffective treatments:  None tried Associated symptoms: headaches and sinus pain   Associated symptoms: no arthralgias   Eye Pain  Associated symptoms include headaches. Pertinent negatives include no chest pain, no abdominal pain and no shortness of breath.    Past Medical History:  Diagnosis Date  . Arthritis   . Diabetes mellitus without complication ([place])    type II   . Diverticulitis   . Dyspnea    mild   . GERD (gastroesophageal reflux disease)   . History of bronchitis   . Hypertension   . IBS (irritable bowel syndrome)   . Palpitations   . Sleep apnea    mild but no sleep apnea   . TMJ (dislocation of temporomandibular joint)   . 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 Biceps tendinitis of right shoulder 04/05/2017  . Impingement syndrome of right shoulder 04/05/2017  . Arthritis of right acromioclavicular joint 04/05/2017  . Partial nontraumatic tear of rotator cuff, right 04/05/2017    Past Surgical History:  Procedure Laterality Date  . ABDOMINAL HYSTERECTOMY    . ESOPHAGOGASTRODUODENOSCOPY ENDOSCOPY     with esophagus being stretched twice per pt,   . EYE SURGERY    . 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 feeding tube placement and removal   2015  . NASAL SEPTUM SURGERY    . spurs removed from esophagus   2015  . TONSILLECTOMY      OB  History   None      Home Medications    Prior to Admission medications   Medication Sig Start Date End Date Taking? Authorizing Provider  acyclovir (ZOVIRAX) 400 MG tablet Take 400 mg by mouth 2 (two) times daily.    [provider]  albuterol (PROVENTIL HFA;VENTOLIN HFA) 108 (90 BASE) MCG/ACT inhaler Inhale 2 puffs into the lungs 2 (two) times daily.     [provider]  albuterol (PROVENTIL) (2.5 MG/3ML) 0.083% nebulizer solution Take 3 mLs (2.5 mg total) by nebulization every 4 (four) hours as needed for wheezing or shortness of breath. 05/01/17   Charlesetta Shanks, MD  aspirin EC 325 MG tablet Take 1 tablet (325 mg total) by mouth daily. 04/05/17   Nicholes Stairs, MD  cetirizine (ZYRTEC) 10 MG tablet Take 1 tablet (10 mg total) by mouth daily. 03/26/18   Tasia Catchings, Amy V, PA-C  EPINEPHrine 0.3 mg/0.3 mL IJ SOAJ injection Inject 0.3 mg into the muscle once as needed (allergic reactions).     [provider]  fluticasone (FLONASE) 50 MCG/ACT nasal spray Place 2 sprays into both nostrils daily. 06/08/18   Raylene Everts, MD  Fluticasone-Salmeterol (ADVAIR) 250-50 MCG/DOSE AEPB Inhale 1 puff into the lungs 2 (two) times daily.    [provider]  gabapentin (NEURONTIN) 300 MG capsule Take 300 mg by mouth 3 (three) times daily.    [provider]  metoprolol tartrate (LOPRESSOR) 25 MG tablet Take 25 mg by mouth 2 (two) times daily.    [provider]  montelukast (SINGULAIR) 10 MG tablet Take 10 mg by mouth every evening.     [provider]  Multiple Vitamin (MULTIVITAMIN WITH MINERALS) TABS tablet Take 1 tablet by mouth daily.    [provider]  omeprazole (PRILOSEC) 40 MG capsule Take 1 capsule (40 mg total) by mouth 2 (two) times daily before a meal. 11/17/16 07/15/17  Nandigam, Venia Minks, MD  ondansetron (ZOFRAN-ODT) 4 MG disintegrating tablet Take 1 tablet (4 mg total) by mouth every 8 (eight) hours as needed for nausea  or vomiting. 05/24/18   Zigmund Gottron, NP  promethazine-dextromethorphan (PROMETHAZINE-DM) 6.25-15 MG/5ML syrup Take 5 mLs by mouth 4 (four) times daily as needed for cough. 06/08/18   Raylene Everts, MD  ranitidine (ZANTAC) 150 MG tablet Take 1 tablet (150 mg total) by mouth 2 (two) times daily. 06/08/18   Raylene Everts, MD  sitaGLIPtin-metformin (JANUMET) 50-500 MG tablet Take 1 tablet by mouth daily.    [provider]    Family History Family History  Problem Relation Age of Onset  . Diabetes Father     Social History Social History   Tobacco Use  . Smoking status: Former Research scientist (life sciences)  . Smokeless tobacco: Never Used  Substance Use Topics  . Alcohol use: No  . Drug use: No     Allergies   Crab [shellfish allergy]; Erythromycin; Fish allergy; Metoclopramide; Levaquin [levofloxacin in d5w]; Morphine and related; and Sulfa antibiotics   Review of Systems Review of Systems  Constitutional: Negative for chills. Fever: subjective.  HENT: Positive for congestion, sinus pain and sore throat. Negative for ear pain.   Eyes: Positive for pain and discharge. Negative for visual disturbance.  Respiratory: Positive for cough. Negative for shortness of breath.   Cardiovascular: Negative for chest pain and palpitations.  Gastrointestinal: Negative for abdominal pain and vomiting.  Genitourinary: Negative for dysuria and hematuria.  Musculoskeletal: Negative for arthralgias and back pain.  Skin: Negative for color change and rash.  Neurological: Positive for headaches. Negative for seizures and syncope.  All other systems reviewed and are negative.    Physical Exam Triage Vital Signs ED Triage Vitals [08/17/18 1109]  Enc Vitals Group     BP 124/70     Pulse Rate 72     Resp 18     Temp 97.7 F (36.5 C)     Temp Source Oral     SpO2 100 %     Weight      Height      Head Circumference      Peak Flow      Pain Score      Pain Loc      Pain Edu?      Excl. in  [place]?    No data found.  Updated Vital Signs BP 124/70 (BP Location: Right Arm)   Pulse 72   Temp 97.7 F (36.5 C) (Oral)   Resp 18   SpO2 100%   Visual Acuity Right Eye Distance:   Left Eye Distance:   Bilateral Distance:    Right Eye Near:   Left Eye Near:    Bilateral Near:     Physical Exam  Constitutional: She appears well-developed and well-nourished. No distress.  HENT:  Head: Normocephalic and atraumatic.  Mouth/Throat: No oropharyngeal exudate.  Eyes: Pupils are equal, round, and reactive to light. Conjunctivae and EOM are normal. Right eye exhibits no discharge and no exudate. Left eye exhibits no discharge and no exudate.  Neck: Normal range of motion. Neck supple.  Cardiovascular: Normal rate and regular rhythm.  No murmur heard. Pulmonary/Chest: Effort normal and breath sounds normal. No respiratory distress.  Musculoskeletal: She exhibits no edema.  Lymphadenopathy:    She has no cervical adenopathy.  Neurological: She is alert.  Skin: Skin is warm and dry.  Psychiatric: She has a normal mood and affect.  Nursing note and vitals reviewed.    UC Treatments / Results  Labs (all labs ordered are listed, but only abnormal results are displayed) Labs Reviewed - No data to display  EKG None  Radiology No results found.  Procedures Procedures (including critical care time)  Medications Ordered in UC Medications - No data to display  Initial Impression / Assessment and Plan / UC Course  I have reviewed the triage vital signs and the nursing notes.  Pertinent labs & imaging results that were available during my care of the patient were reviewed by me and considered in my medical decision making (see chart for details).     Treat based on symptom duration.  Likely sinusitis.  She was given return precautions. Final Clinical Impressions(s) / UC Diagnoses   Final diagnoses:  Conjunctivitis of both eyes, unspecified conjunctivitis type  Acute  non-recurrent frontal sinusitis   Discharge Instructions   None    ED Prescriptions    Medication Sig Dispense Auth. Provider   amoxicillin-clavulanate (AUGMENTIN) 875-125 MG tablet Take 1 tablet by mouth every 12 (twelve) hours. 14 tablet Nikolis Berent, Mancel Bale, MD   trimethoprim-polymyxin b (POLYTRIM) ophthalmic solution Place 1 drop into both eyes every 4 (four) hours for 5 days. 10 mL Katy Fitch, MD   promethazine-dextromethorphan (PROMETHAZINE-DM) 6.25-15 MG/5ML syrup Take 5 mLs by mouth 4 (four) times daily as needed for cough. 118 mL Katy Fitch, MD     Controlled Substance Prescriptions [place] Controlled Substance Registry consulted? No   Katy Fitch, MD 08/17/18 1130

## 2018-09-07 ENCOUNTER — Ambulatory Visit
Admission: RE | Admit: 2018-09-07 | Discharge: 2018-09-07 | Disposition: A | Discharge status: Home / Self Care | Payer: Medicare Other | Source: Ambulatory Visit | Attending: Family Medicine | Admitting: Family Medicine

## 2018-09-07 DIAGNOSIS — Z1231 Encounter for screening mammogram for malignant neoplasm of breast: Secondary | ICD-10-CM

## 2018-10-06 ENCOUNTER — Encounter (HOSPITAL_COMMUNITY): Payer: Self-pay | Admitting: Emergency Medicine

## 2018-10-06 ENCOUNTER — Other Ambulatory Visit: Payer: Self-pay

## 2018-10-06 ENCOUNTER — Emergency Department (HOSPITAL_COMMUNITY): Payer: Medicare Other

## 2018-10-06 ENCOUNTER — Emergency Department (HOSPITAL_COMMUNITY)
Admission: EM | Admit: 2018-10-06 | Discharge: 2018-10-06 | Disposition: A | Discharge status: Home / Self Care | Payer: Medicare Other | Attending: Emergency Medicine | Admitting: Emergency Medicine

## 2018-10-06 DIAGNOSIS — J40 Bronchitis, not specified as acute or chronic: Secondary | ICD-10-CM | POA: Diagnosis not present

## 2018-10-06 DIAGNOSIS — R110 Nausea: Secondary | ICD-10-CM | POA: Diagnosis not present

## 2018-10-06 DIAGNOSIS — E119 Type 2 diabetes mellitus without complications: Secondary | ICD-10-CM | POA: Diagnosis not present

## 2018-10-06 DIAGNOSIS — Z87891 Personal history of nicotine dependence: Secondary | ICD-10-CM | POA: Insufficient documentation

## 2018-10-06 DIAGNOSIS — Z7982 Long term (current) use of aspirin: Secondary | ICD-10-CM | POA: Insufficient documentation

## 2018-10-06 DIAGNOSIS — M7918 Myalgia, other site: Secondary | ICD-10-CM | POA: Insufficient documentation

## 2018-10-06 DIAGNOSIS — I10 Essential (primary) hypertension: Secondary | ICD-10-CM | POA: Diagnosis not present

## 2018-10-06 DIAGNOSIS — J029 Acute pharyngitis, unspecified: Secondary | ICD-10-CM | POA: Insufficient documentation

## 2018-10-06 DIAGNOSIS — Z79899 Other long term (current) drug therapy: Secondary | ICD-10-CM | POA: Diagnosis not present

## 2018-10-06 DIAGNOSIS — R51 Headache: Secondary | ICD-10-CM | POA: Diagnosis not present

## 2018-10-06 DIAGNOSIS — R05 Cough: Secondary | ICD-10-CM | POA: Diagnosis present

## 2018-10-06 LAB — GROUP A STREP BY PCR: Group A Strep by PCR: NOT DETECTED

## 2018-10-06 MED ORDER — DOXYCYCLINE HYCLATE 100 MG PO CAPS: 100.0000 mg | ORAL_CAPSULE | Freq: Two times a day (BID) | ORAL | 0 refills | Status: DC

## 2018-10-06 MED ORDER — BENZONATATE 100 MG PO CAPS: 100.0000 mg | ORAL_CAPSULE | Freq: Three times a day (TID) | ORAL | 0 refills | Status: DC

## 2018-10-06 NOTE — ED Provider Notes (Signed)
[facility] DEPT Provider Note   CSN: [id number] Arrival date & time: 10/06/18  1203     History   Chief Complaint Chief Complaint  Patient presents with  . Sore Throat  . Cough  . Headache  . Generalized Body Aches    HPI Pamela Roberson is a 62 y.o. female who present to the ED with c/o sore throat, generalized body aches, non productive cough x 14 days. Patient reports taking OTC medications without relief.   The history is provided by the patient. No language interpreter was used.  Cough  Associated symptoms include chills, headaches, sore throat and wheezing. Pertinent negatives include no chest pain and no ear pain (pressure).  Headache   Associated symptoms include nausea. Fever: low grade.  URI   This is a new problem. The current episode started more than 1 week ago. The maximum temperature recorded prior to her arrival was 100 to 100.9 F. Associated symptoms include nausea, congestion, headaches, plugged ear sensation, sore throat, cough and wheezing. Pertinent negatives include no chest pain, no abdominal pain, no dysuria and no ear pain (pressure). Neck pain: soreness left side. She has tried an inhaler and other medications for the symptoms. The treatment provided mild relief.    Past Medical History:  Diagnosis Date  . Arthritis   . Diabetes mellitus without complication ([place])    type II   . Diverticulitis   . Dyspnea    mild   . GERD (gastroesophageal reflux disease)   . History of bronchitis   . Hypertension   . IBS (irritable bowel syndrome)   . Palpitations   . Sleep apnea    mild but no sleep apnea   . TMJ (dislocation of temporomandibular joint)   . 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 Biceps tendinitis of right shoulder 04/05/2017  . Impingement syndrome of right shoulder 04/05/2017  . Arthritis of right acromioclavicular joint 04/05/2017  . Partial  nontraumatic tear of rotator cuff, right 04/05/2017    Past Surgical History:  Procedure Laterality Date  . ABDOMINAL HYSTERECTOMY    . ESOPHAGOGASTRODUODENOSCOPY ENDOSCOPY     with esophagus being stretched twice per pt,   . EYE SURGERY    . 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 feeding tube placement and removal   2015  . NASAL SEPTUM SURGERY    . spurs removed from esophagus   2015  . TONSILLECTOMY       OB History   None      Home Medications    Prior to Admission medications   Medication Sig Start Date End Date Taking? Authorizing Provider  acyclovir (ZOVIRAX) 400 MG tablet Take 400 mg by mouth 2 (two) times daily.    [provider]  albuterol (PROVENTIL HFA;VENTOLIN HFA) 108 (90 BASE) MCG/ACT inhaler Inhale 2 puffs into the lungs 2 (two) times daily.     [provider]  albuterol (PROVENTIL) (2.5 MG/3ML) 0.083% nebulizer solution Take 3 mLs (2.5 mg total) by nebulization every 4 (four) hours as needed for wheezing or shortness of breath. 05/01/17   Charlesetta Shanks, MD  aspirin EC 325 MG tablet Take 1 tablet (325 mg total) by mouth daily. 04/05/17   Nicholes Stairs, MD  benzonatate (TESSALON) 100 MG capsule Take 1 capsule (100 mg total) by mouth every 8 (eight) hours. 10/06/18   Janit Bern,  Jacquese Cassarino M, NP  cetirizine (ZYRTEC) 10 MG tablet Take 1 tablet (10 mg total) by mouth daily. 03/26/18   Tasia Catchings, Amy V, PA-C  doxycycline (VIBRAMYCIN) 100 MG capsule Take 1 capsule (100 mg total) by mouth 2 (two) times daily. 10/06/18   Ashley Murrain, NP  EPINEPHrine 0.3 mg/0.3 mL IJ SOAJ injection Inject 0.3 mg into the muscle once as needed (allergic reactions).     [provider]  fluticasone (FLONASE) 50 MCG/ACT nasal spray Place 2 sprays into both nostrils daily. 06/08/18   Raylene Everts, MD  Fluticasone-Salmeterol (ADVAIR) 250-50 MCG/DOSE AEPB Inhale 1 puff into the lungs 2 (two) times daily.    [provider]  gabapentin (NEURONTIN) 300 MG capsule  Take 300 mg by mouth 3 (three) times daily.    [provider]  metoprolol tartrate (LOPRESSOR) 25 MG tablet Take 25 mg by mouth 2 (two) times daily.    [provider]  montelukast (SINGULAIR) 10 MG tablet Take 10 mg by mouth every evening.     [provider]  Multiple Vitamin (MULTIVITAMIN WITH MINERALS) TABS tablet Take 1 tablet by mouth daily.    [provider]  omeprazole (PRILOSEC) 40 MG capsule Take 1 capsule (40 mg total) by mouth 2 (two) times daily before a meal. 11/17/16 07/15/17  Nandigam, Venia Minks, MD  ondansetron (ZOFRAN-ODT) 4 MG disintegrating tablet Take 1 tablet (4 mg total) by mouth every 8 (eight) hours as needed for nausea or vomiting. 05/24/18   Zigmund Gottron, NP  promethazine-dextromethorphan (PROMETHAZINE-DM) 6.25-15 MG/5ML syrup Take 5 mLs by mouth 4 (four) times daily as needed for cough. 08/17/18   Katy Fitch, MD  ranitidine (ZANTAC) 150 MG tablet Take 1 tablet (150 mg total) by mouth 2 (two) times daily. 06/08/18   Raylene Everts, MD  sitaGLIPtin-metformin (JANUMET) 50-500 MG tablet Take 1 tablet by mouth daily.    [provider]    Family History Family History  Problem Relation Age of Onset  . Diabetes Father     Social History Social History   Tobacco Use  . Smoking status: Former Research scientist (life sciences)  . Smokeless tobacco: Never Used  Substance Use Topics  . Alcohol use: No  . Drug use: No     Allergies   Crab [shellfish allergy]; Erythromycin; Fish allergy; Metoclopramide; Levaquin [levofloxacin in d5w]; Morphine and related; and Sulfa antibiotics   Review of Systems Review of Systems  Constitutional: Positive for chills. Fever: low grade.  HENT: Positive for congestion, sinus pressure and sore throat. Negative for ear pain (pressure) and trouble swallowing.   Eyes: Positive for itching. Negative for visual disturbance.  Respiratory: Positive for cough and wheezing.   Cardiovascular: Negative for chest  pain.  Gastrointestinal: Positive for nausea. Negative for abdominal pain.  Genitourinary: Negative for dysuria, frequency and pelvic pain.  Musculoskeletal: Neck pain: soreness left side.  Neurological: Positive for headaches. Negative for syncope.  Psychiatric/Behavioral: Negative for confusion.     Physical Exam Updated Vital Signs BP (!) 145/86 (BP Location: Right Arm)   Pulse 84   Temp 97.8 F (36.6 C) (Oral)   Resp 18   Ht 5\' 6"  (1.676 m)   Wt 94.3 kg   SpO2 98%   BMI 33.57 kg/m   Physical Exam  Constitutional: She appears well-developed and well-nourished. No distress.  HENT:  Head: Normocephalic.  Right Ear: Tympanic membrane normal.  Left Ear: Tympanic membrane normal.  Nose: Mucosal edema and rhinorrhea present. Right sinus  exhibits maxillary sinus tenderness. Left sinus exhibits maxillary sinus tenderness.  Mouth/Throat: Uvula is midline and mucous membranes are normal. Posterior oropharyngeal erythema present. No posterior oropharyngeal edema.  Eyes: Conjunctivae and EOM are normal.  Neck: Normal range of motion. Neck supple.  Cardiovascular: Normal rate and regular rhythm.  Pulmonary/Chest: Effort normal. No respiratory distress. She has no wheezes. She has no rales.  Abdominal: Soft. Bowel sounds are normal. There is no tenderness.  Musculoskeletal: Normal range of motion.  Lymphadenopathy:    She has no cervical adenopathy.  Neurological: She is alert.  Skin: Skin is warm and dry.  Psychiatric: She has a normal mood and affect.  Nursing note and vitals reviewed.    ED Treatments / Results  Labs (all labs ordered are listed, but only abnormal results are displayed) Labs Reviewed  GROUP A STREP BY PCR    Radiology Dg Chest 2 View  Result Date: 10/06/2018 CLINICAL DATA:  Cough for 2 weeks EXAM: CHEST - 2 VIEW COMPARISON:  05/01/2017 FINDINGS: The heart size and mediastinal contours are within normal limits. Both lungs are clear. The visualized  skeletal structures are unremarkable. IMPRESSION: No active cardiopulmonary disease. Electronically Signed   By: Kathreen Devoid   On: 10/06/2018 14:32    Procedures Procedures (including critical care time)  Medications Ordered in ED Medications - No data to display   Initial Impression / Assessment and Plan / ED Course  I have reviewed the triage vital signs and the nursing notes.  62 y.o. female with 2 week hx of cough, sinus pressure and body aches. Will treat for bronchitis and patient will f/u with her PCP. Stable for d/c without respiratory distress and O2 SAT 98% on R/A. Patient to return for worsening symptoms.  Final Clinical Impressions(s) / ED Diagnoses   Final diagnoses:  Bronchitis  Sore throat    ED Discharge Orders         Ordered    doxycycline (VIBRAMYCIN) 100 MG capsule  2 times daily     10/06/18 1557    benzonatate (TESSALON) 100 MG capsule  Every 8 hours     10/06/18 1557           Janit Bern [place], NP 10/06/18 2103    Varney Biles, MD 10/07/18 [phone]

## 2018-10-06 NOTE — Discharge Instructions (Addendum)
Follow up with your doctor in the next few days for recheck. Return here sooner for any problems.

## 2018-10-06 NOTE — ED Triage Notes (Addendum)
Pt c/o sore throat, generalized body aches, pt states sinus pressure and ear pain and non-productive cough x 14 days. Pt states she has tried otc meds and theraflu and no relief.

## 2018-11-14 ENCOUNTER — Ambulatory Visit (HOSPITAL_COMMUNITY)
Admission: EM | Admit: 2018-11-14 | Discharge: 2018-11-14 | Disposition: A | Discharge status: Home / Self Care | Payer: Medicare Other | Attending: Family Medicine | Admitting: Family Medicine

## 2018-11-14 ENCOUNTER — Telehealth (HOSPITAL_COMMUNITY): Payer: Self-pay | Admitting: Emergency Medicine

## 2018-11-14 ENCOUNTER — Encounter (HOSPITAL_COMMUNITY): Payer: Self-pay

## 2018-11-14 DIAGNOSIS — B349 Viral infection, unspecified: Secondary | ICD-10-CM | POA: Insufficient documentation

## 2018-11-14 MED ORDER — PREDNISONE 50 MG PO TABS: 50.0000 mg | ORAL_TABLET | Freq: Every day | ORAL | 0 refills | Status: DC

## 2018-11-14 NOTE — ED Notes (Signed)
Patient able to ambulate independently  

## 2018-11-14 NOTE — ED Provider Notes (Signed)
[facility]    CSN: [id number] Arrival date & time: 11/14/18  0841     History   Chief Complaint Chief Complaint  Patient presents with  . Shortness of Breath  . Fatigue  . Congestion    HPI Pamela Roberson is a 62 y.o. female.   62 year old female comes in for 2 day history of URI symptoms. States was recently treated for bronchitis. Was initially given doxycycline, but unable to tolerate due to stomach upset, and was switched to cefdinir by PCP. She finished course 2-3 weeks ago, and symptoms completely resolved prior to current symptom onset. Has cough, congestion, cold and hot chills. Last night, was in a heated car when she started sweating and feeling short of breath and wheezing. Symptoms improved after getting home. Denies current shortness of breath, wheezing. Denies chest pain. Has been using her nasal sprays and allergy medicine without relief. Has been using her inhaler with some relief. Saw her cardiologist yesterday for follow up prior to symptom onset.      Past Medical History:  Diagnosis Date  . Arthritis   . Diabetes mellitus without complication ([facility])    type II   . Diverticulitis   . Dyspnea    mild   . GERD (gastroesophageal reflux disease)   . History of bronchitis   . Hypertension   . IBS (irritable bowel syndrome)   . Palpitations   . Sleep apnea    mild but no sleep apnea   . TMJ (dislocation of temporomandibular joint)   . 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 Biceps tendinitis of right shoulder 04/05/2017  . Impingement syndrome of right shoulder 04/05/2017  . Arthritis of right acromioclavicular joint 04/05/2017  . Partial nontraumatic tear of rotator cuff, right 04/05/2017    Past Surgical History:  Procedure Laterality Date  . ABDOMINAL HYSTERECTOMY    . ESOPHAGOGASTRODUODENOSCOPY ENDOSCOPY     with esophagus being stretched twice per pt,   . EYE SURGERY     . 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 feeding tube placement and removal   2015  . NASAL SEPTUM SURGERY    . spurs removed from esophagus   2015  . TONSILLECTOMY      OB History   None      Home Medications    Prior to Admission medications   Medication Sig Start Date End Date Taking? Authorizing Provider  acyclovir (ZOVIRAX) 400 MG tablet Take 400 mg by mouth 2 (two) times daily.    [provider]  albuterol (PROVENTIL HFA;VENTOLIN HFA) 108 (90 BASE) MCG/ACT inhaler Inhale 2 puffs into the lungs 2 (two) times daily.     [provider]  albuterol (PROVENTIL) (2.5 MG/3ML) 0.083% nebulizer solution Take 3 mLs (2.5 mg total) by nebulization every 4 (four) hours as needed for wheezing or shortness of breath. 05/01/17   Charlesetta Shanks, MD  aspirin EC 325 MG tablet Take 1 tablet (325 mg total) by mouth daily. 04/05/17   Nicholes Stairs, MD  cetirizine (ZYRTEC) 10 MG tablet Take 1 tablet (10 mg total) by mouth daily. 03/26/18   Tasia Catchings, Jayln Branscom V, PA-C  EPINEPHrine 0.3 mg/0.3 mL IJ SOAJ injection Inject 0.3 mg into the muscle once as needed (allergic reactions).     [provider]  fluticasone (FLONASE) 50 MCG/ACT nasal spray Place 2 sprays into  both nostrils daily. 06/08/18   Raylene Everts, MD  Fluticasone-Salmeterol (ADVAIR) 250-50 MCG/DOSE AEPB Inhale 1 puff into the lungs 2 (two) times daily.    [provider]  gabapentin (NEURONTIN) 300 MG capsule Take 300 mg by mouth 3 (three) times daily.    [provider]  metoprolol tartrate (LOPRESSOR) 25 MG tablet Take 25 mg by mouth 2 (two) times daily.    [provider]  montelukast (SINGULAIR) 10 MG tablet Take 10 mg by mouth every evening.     [provider]  Multiple Vitamin (MULTIVITAMIN WITH MINERALS) TABS tablet Take 1 tablet by mouth daily.    [provider]  omeprazole (PRILOSEC) 40 MG capsule Take 1 capsule (40 mg total) by mouth 2 (two) times daily before  a meal. 11/17/16 07/15/17  Nandigam, Venia Minks, MD  ondansetron (ZOFRAN-ODT) 4 MG disintegrating tablet Take 1 tablet (4 mg total) by mouth every 8 (eight) hours as needed for nausea or vomiting. 05/24/18   Zigmund Gottron, NP  predniSONE (DELTASONE) 50 MG tablet Take 1 tablet (50 mg total) by mouth daily. 11/14/18   Tasia Catchings, Xandra Laramee V, PA-C  ranitidine (ZANTAC) 150 MG tablet Take 1 tablet (150 mg total) by mouth 2 (two) times daily. 06/08/18   Raylene Everts, MD  sitaGLIPtin-metformin (JANUMET) 50-500 MG tablet Take 1 tablet by mouth daily.    [provider]    Family History Family History  Problem Relation Age of Onset  . Diabetes Father     Social History Social History   Tobacco Use  . Smoking status: Former Research scientist (life sciences)  . Smokeless tobacco: Never Used  Substance Use Topics  . Alcohol use: No  . Drug use: No     Allergies   Crab [shellfish allergy]; Erythromycin; Fish allergy; Metoclopramide; Doxycycline; Levaquin [levofloxacin in d5w]; Morphine and related; and Sulfa antibiotics   Review of Systems Review of Systems  Reason unable to perform ROS: See HPI as above.     Physical Exam Triage Vital Signs ED Triage Vitals  Enc Vitals Group     BP 11/14/18 0941 136/81     Pulse Rate 11/14/18 0941 66     Resp 11/14/18 0941 18     Temp 11/14/18 0941 97.9 F (36.6 C)     Temp Source 11/14/18 0941 Oral     SpO2 11/14/18 0941 100 %     Weight --      Height --      Head Circumference --      Peak Flow --      Pain Score 11/14/18 0942 5     Pain Loc --      Pain Edu? --      Excl. in [place]? --    No data found.  Updated Vital Signs BP 136/81 (BP Location: Right Arm)   Pulse 66   Temp 97.9 F (36.6 C) (Oral)   Resp 18   SpO2 100%   Physical Exam  Constitutional: She is oriented to person, place, and time. She appears well-developed and well-nourished.  Non-toxic appearance. She does not appear ill. No distress.  HENT:  Head: Normocephalic and atraumatic.  Right  Ear: Tympanic membrane, external ear and ear canal normal. Tympanic membrane is not erythematous and not bulging.  Left Ear: Tympanic membrane, external ear and ear canal normal. Tympanic membrane is not erythematous and not bulging.  Nose: Nose normal. Right sinus exhibits no maxillary sinus tenderness and no frontal sinus tenderness. Left  sinus exhibits no maxillary sinus tenderness and no frontal sinus tenderness.  Mouth/Throat: Uvula is midline, oropharynx is clear and moist and mucous membranes are normal.  Eyes: Pupils are equal, round, and reactive to light. Conjunctivae are normal.  Neck: Normal range of motion. Neck supple.  Cardiovascular: Normal rate, regular rhythm and normal heart sounds. Exam reveals no gallop and no friction rub.  No murmur heard. Pulmonary/Chest: Effort normal and breath sounds normal. No accessory muscle usage or stridor. No respiratory distress. She has no decreased breath sounds. She has no wheezes. She has no rhonchi. She has no rales.  Talking in full sentences without difficulty.   Musculoskeletal:       Right lower leg: She exhibits no edema.       Left lower leg: She exhibits no edema.  Lymphadenopathy:    She has no cervical adenopathy.  Neurological: She is alert and oriented to person, place, and time.  Skin: Skin is warm and dry.  Psychiatric: She has a normal mood and affect. Her behavior is normal. Judgment normal.     UC Treatments / Results  Labs (all labs ordered are listed, but only abnormal results are displayed) Labs Reviewed - No data to display  EKG None  Radiology No results found.  Procedures Procedures (including critical care time)  Medications Ordered in UC Medications - No data to display  Initial Impression / Assessment and Plan / UC Course  I have reviewed the triage vital signs and the nursing notes.  Pertinent labs & imaging results that were available during my care of the patient were reviewed by me and  considered in my medical decision making (see chart for details).    No known last a1c, but patient states well controlled DM. Will provide short course of prednisone for symptoms. Other symptomatic treatment discussed. Push fluids. Return precautions given. Patient expresses understanding and agrees to plan.  Final Clinical Impressions(s) / UC Diagnoses   Final diagnoses:  Viral illness    ED Prescriptions    Medication Sig Dispense Auth. Provider   predniSONE (DELTASONE) 50 MG tablet Take 1 tablet (50 mg total) by mouth daily. 5 tablet Tobin Chad, PA-C 11/14/18 1100

## 2018-11-14 NOTE — Discharge Instructions (Signed)
Start prednisone as directed. Continue flonase, atrovent nasal spray, zyrtec for nasal congestion/drainage. You can use over the counter nasal saline rinse such as neti pot for nasal congestion. Keep hydrated, your urine should be clear to pale yellow in color. Tylenol/motrin for fever and pain. Monitor for any worsening of symptoms, chest pain, shortness of breath, wheezing, go to the emergency department for further evaluation needed.

## 2018-11-14 NOTE — ED Triage Notes (Signed)
Pt presents with shortness of breath, wheezing, chest congestion and body fatigue.

## 2018-12-05 ENCOUNTER — Encounter (HOSPITAL_COMMUNITY): Payer: Self-pay | Admitting: Emergency Medicine

## 2018-12-05 ENCOUNTER — Ambulatory Visit (HOSPITAL_COMMUNITY)
Admission: EM | Admit: 2018-12-05 | Discharge: 2018-12-05 | Disposition: A | Discharge status: Home / Self Care | Payer: Medicare HMO | Attending: Family Medicine | Admitting: Family Medicine

## 2018-12-05 DIAGNOSIS — J0100 Acute maxillary sinusitis, unspecified: Secondary | ICD-10-CM | POA: Diagnosis not present

## 2018-12-05 DIAGNOSIS — J029 Acute pharyngitis, unspecified: Secondary | ICD-10-CM | POA: Insufficient documentation

## 2018-12-05 MED ORDER — AMOXICILLIN-POT CLAVULANATE 875-125 MG PO TABS: 1.0000 | ORAL_TABLET | Freq: Two times a day (BID) | ORAL | 0 refills | Status: DC

## 2018-12-05 NOTE — ED Triage Notes (Signed)
Pt c/o sore throat, pt also states she has had some reflux.

## 2018-12-07 NOTE — ED Provider Notes (Signed)
[facility]   [id number] 12/05/18 Arrival Time: 7902  ASSESSMENT & PLAN:  1. Acute non-recurrent maxillary sinusitis   2. Sore throat     Meds ordered this encounter  Medications  . amoxicillin-clavulanate (AUGMENTIN) 875-125 MG tablet    Sig: Take 1 tablet by mouth every 12 (twelve) hours.    Dispense:  20 tablet    Refill:  0   Discussed typical duration of symptoms. OTC symptom care as needed. Ensure adequate fluid intake and rest.  [facility].   Why:  As needed. Contact information: [place] [address] [phone]        [facility].   Specialty:  Urgent Care Why:  If symptoms worsen. Contact information: [place] [phone]         Reviewed expectations re: course of current medical issues. Questions answered. Outlined signs and symptoms indicating need for more acute intervention. Patient verbalized understanding. After Visit Summary given.   SUBJECTIVE: History from: patient.  Pamela Roberson is a 63 y.o. female who presents with complaint of nasal congestion, post-nasal drainage, and sinus pain. Onset gradual, 2+ weeks. Mild associated ST. Respiratory symptoms: none Fever: no. Overall normal PO intake without n/v. OTC treatment: decongestant without relief. History of frequent sinus infections: no. No specific aggravating or alleviating factors reported. Social History   Tobacco Use  Smoking Status Former Smoker  Smokeless Tobacco Never Used    ROS: As per HPI.   OBJECTIVE:  Vitals:   12/05/18 1921  BP: (!) 134/93  Pulse: 94  Resp: 18  Temp: 98.2 F (36.8 C)  SpO2: 97%     General appearance: alert; appears fatigued HEENT: nasal congestion; clear runny nose; throat irritation secondary to post-nasal drainage; bilateral frontal tenderness to palpation; turbinates boggy Neck: supple  without LAD; trachea midline Lungs: unlabored respirations, symmetrical air entry; cough: absent; no respiratory distress Skin: warm and dry Psychological: alert and cooperative; normal mood and affect  Allergies  Allergen Reactions  . Crab [Shellfish Allergy] Shortness Of Breath and Swelling  . Erythromycin Swelling and Rash  . Fish Allergy     Swelling   . Metoclopramide Other (See Comments)    Slurred speech and unable to move muscles,  Caused her to drag her feet  . Doxycycline     GI Intolerance  . Levaquin [Levofloxacin In D5w] Other (See Comments)    Causes irregular heart beat  . Morphine And Related Nausea And Vomiting and Rash    Irregular heart beat  . Sulfa Antibiotics Rash    Past Medical History:  Diagnosis Date  . Arthritis   . Diabetes mellitus without complication ([facility])    type II   . Diverticulitis   . Dyspnea    mild   . GERD (gastroesophageal reflux disease)   . History of bronchitis   . Hypertension   . IBS (irritable bowel syndrome)   . Palpitations   . Sleep apnea    mild but no sleep apnea   . TMJ (dislocation of temporomandibular joint)   . 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 Diabetes Father    Social History   Socioeconomic History  . Marital status: Widowed    Spouse name: Not on file  . Number of children: 3  . Years of education: Not  on file  . Highest education level: Not on file  Occupational History  . Not on file  Social Needs  . Financial resource strain: Not on file  . Food insecurity:    Worry: Not on file    Inability: Not on file  . Transportation needs:    Medical: Not on file    Non-medical: Not on file  Tobacco Use  . Smoking status: Former Research scientist (life sciences)  . Smokeless tobacco: Never Used  Substance and Sexual Activity  . Alcohol use: No  . Drug use: No  . Sexual activity: Not on file  Lifestyle  . Physical activity:    Days per week: Not on file    Minutes  per session: Not on file  . Stress: Not on file  Relationships  . Social connections:    Talks on phone: Not on file    Gets together: Not on file    Attends religious service: Not on file    Active member of club or organization: Not on file    Attends meetings of clubs or organizations: Not on file    Relationship status: Not on file  . Intimate partner violence:    Fear of current or ex partner: Not on file    Emotionally abused: Not on file    Physically abused: Not on file    Forced sexual activity: Not on file  Other Topics Concern  . Not on file  Social History Narrative  . Not on file            Vanessa Kick, MD 12/07/18 [phone]

## 2019-02-02 ENCOUNTER — Other Ambulatory Visit: Payer: Self-pay

## 2019-02-02 ENCOUNTER — Emergency Department (HOSPITAL_COMMUNITY): Payer: Medicare HMO

## 2019-02-02 ENCOUNTER — Emergency Department (HOSPITAL_COMMUNITY)
Admission: EM | Admit: 2019-02-02 | Discharge: 2019-02-02 | Disposition: A | Discharge status: Home / Self Care | Payer: Medicare HMO | Attending: Emergency Medicine | Admitting: Emergency Medicine

## 2019-02-02 ENCOUNTER — Encounter (HOSPITAL_COMMUNITY): Payer: Self-pay

## 2019-02-02 DIAGNOSIS — R55 Syncope and collapse: Secondary | ICD-10-CM | POA: Insufficient documentation

## 2019-02-02 DIAGNOSIS — R110 Nausea: Secondary | ICD-10-CM | POA: Diagnosis not present

## 2019-02-02 DIAGNOSIS — E860 Dehydration: Secondary | ICD-10-CM | POA: Insufficient documentation

## 2019-02-02 LAB — URINALYSIS, ROUTINE W REFLEX MICROSCOPIC
Bilirubin Urine: NEGATIVE
Glucose, UA: 50 mg/dL — AB
Hgb urine dipstick: NEGATIVE
Ketones, ur: NEGATIVE mg/dL
Leukocytes,Ua: NEGATIVE
Nitrite: NEGATIVE
Protein, ur: NEGATIVE mg/dL
Specific Gravity, Urine: 1.01 (ref 1.005–1.030)
pH: 6 (ref 5.0–8.0)

## 2019-02-02 LAB — CBG MONITORING, ED: Glucose-Capillary: 160 mg/dL — ABNORMAL HIGH (ref 70–99)

## 2019-02-02 LAB — CBC WITH DIFFERENTIAL/PLATELET
Abs Immature Granulocytes: 0.03 10*3/uL (ref 0.00–0.07)
Basophils Absolute: 0 10*3/uL (ref 0.0–0.1)
Basophils Relative: 1 %
Eosinophils Absolute: 0.1 10*3/uL (ref 0.0–0.5)
Eosinophils Relative: 1 %
HCT: 48.3 % — ABNORMAL HIGH (ref 36.0–46.0)
Hemoglobin: 15.2 g/dL — ABNORMAL HIGH (ref 12.0–15.0)
Immature Granulocytes: 1 %
Lymphocytes Relative: 24 %
Lymphs Abs: 1.5 10*3/uL (ref 0.7–4.0)
MCH: 28.3 pg (ref 26.0–34.0)
MCHC: 31.5 g/dL (ref 30.0–36.0)
MCV: 89.9 fL (ref 80.0–100.0)
Monocytes Absolute: 0.5 10*3/uL (ref 0.1–1.0)
Monocytes Relative: 9 %
Neutro Abs: 4 10*3/uL (ref 1.7–7.7)
Neutrophils Relative %: 64 %
Platelets: 243 10*3/uL (ref 150–400)
RBC: 5.37 MIL/uL — ABNORMAL HIGH (ref 3.87–5.11)
RDW: 12.8 % (ref 11.5–15.5)
WBC: 6.1 10*3/uL (ref 4.0–10.5)
nRBC: 0 % (ref 0.0–0.2)

## 2019-02-02 LAB — BASIC METABOLIC PANEL
Anion gap: 11 (ref 5–15)
BUN: 14 mg/dL (ref 8–23)
CO2: 23 mmol/L (ref 22–32)
Calcium: 9.3 mg/dL (ref 8.9–10.3)
Chloride: 106 mmol/L (ref 98–111)
Creatinine, Ser: 0.9 mg/dL (ref 0.44–1.00)
GFR calc Af Amer: >60 mL/min (ref >60)
GFR calc non Af Amer: >60 mL/min (ref >60)
Glucose, Bld: 172 mg/dL — ABNORMAL HIGH (ref 70–99)
Potassium: 4.3 mmol/L (ref 3.5–5.1)
Sodium: 140 mmol/L (ref 135–145)

## 2019-02-02 LAB — I-STAT TROPONIN, ED: Troponin i, poc: 0 ng/mL (ref 0.00–0.08)

## 2019-02-02 MED ORDER — ONDANSETRON 4 MG PO TBDP: 4.0000 mg | ORAL_TABLET | Freq: Three times a day (TID) | ORAL | 0 refills | Status: DC | PRN

## 2019-02-02 MED ORDER — ONDANSETRON HCL 4 MG/2ML IJ SOLN: 4.0000 mg | Freq: Once | INTRAMUSCULAR | Status: DC

## 2019-02-02 MED ORDER — SODIUM CHLORIDE 0.9 % IV BOLUS
2000.0000 mL | Freq: Once | INTRAVENOUS | Status: AC
  Administered 2019-02-02: 2000 mL via INTRAVENOUS

## 2019-02-02 NOTE — ED Triage Notes (Signed)
Pt bib ems from food court for near syncopal episode and generalized weakness while standing in line. Pt recently dx with pneumonia. Pt able to ambulate but c.o dizziness. VSS Pt a.o, nad noted

## 2019-02-02 NOTE — ED Provider Notes (Signed)
[facility] EMERGENCY DEPARTMENT Provider Note   CSN: [id number] Arrival date & time: 02/02/19  1420    History   Chief Complaint Chief Complaint  Patient presents with  . Near Syncope  . Weakness    HPI    Pamela Roberson is a 63 y.o. female with a PMHx of DM2, peripheral neuropathy, gastroparesis, diverticulitis, GERD, HTN, IBS, and other conditions listed below, who presents to the ED with complaints of near syncopal episode.  Patient states that she was going up the escalator in the mall and started to feel dizzy like she was going to pass out.  She went to the food court and was standing in line at a grill and she again started to feel lightheaded/dizzy, got a hot feeling and got diaphoretic, started feeling nauseous and generally weak.  She started feeling thirsty.  She tried drinking a bottle of water but this did not help.  She tried to eat a [place] fry and this also did not help, sitting did not seem to help either, so she called 911.  She has noticed that standing up seems to make her feel more lightheaded, and laying down seems to improve it.  She also reports having "a little headache" on the top of her head.  She mentions that she was recently told that she had "lung scars" about 4 weeks ago, she was later given an antibiotic a couple weeks ago for ?PNA, which she has finished; she was sent for a CT chest yesterday by her PCP but she hasn't received the results of that yet.  She states that overall her cough is improved.  She denies any vision changes, fevers, chills, chest pain, shortness of breath, abdominal pain, vomiting, diarrhea, constipation, dysuria, hematuria, myalgias, arthralgias, numbness, tingling, focal weakness, or any other complaints at this time.  The history is provided by the patient and medical records. No language interpreter was used.  Near Syncope  Associated symptoms include headaches. Pertinent negatives include no chest pain, no  abdominal pain and no shortness of breath.  Weakness  Associated symptoms: headaches, nausea and near-syncope   Associated symptoms: no abdominal pain, no arthralgias, no chest pain, no cough, no diarrhea, no dysuria, no fever, no myalgias, no shortness of breath and no vomiting     Past Medical History:  Diagnosis Date  . Arthritis   . Diabetes mellitus without complication ([place])    type II   . Diverticulitis   . Dyspnea    mild   . GERD (gastroesophageal reflux disease)   . History of bronchitis   . Hypertension   . IBS (irritable bowel syndrome)   . Palpitations   . Sleep apnea    mild but no sleep apnea   . TMJ (dislocation of temporomandibular joint)   . 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 Biceps tendinitis of right shoulder 04/05/2017  . Impingement syndrome of right shoulder 04/05/2017  . Arthritis of right acromioclavicular joint 04/05/2017  . Partial nontraumatic tear of rotator cuff, right 04/05/2017    Past Surgical History:  Procedure Laterality Date  . ABDOMINAL HYSTERECTOMY    . ESOPHAGOGASTRODUODENOSCOPY ENDOSCOPY     with esophagus being stretched twice per pt,   . EYE SURGERY    . 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 feeding tube placement and removal   2015  . NASAL  SEPTUM SURGERY    . spurs removed from esophagus   2015  . TONSILLECTOMY       OB History   No obstetric history on file.      Home Medications    Prior to Admission medications   Medication Sig Start Date End Date Taking? Authorizing Provider  acyclovir (ZOVIRAX) 400 MG tablet Take 400 mg by mouth 2 (two) times daily.    [provider]  albuterol (PROVENTIL HFA;VENTOLIN HFA) 108 (90 BASE) MCG/ACT inhaler Inhale 2 puffs into the lungs 2 (two) times daily.     [provider]  albuterol (PROVENTIL) (2.5 MG/3ML) 0.083% nebulizer solution Take 3 mLs (2.5 mg total) by nebulization every 4 (four)  hours as needed for wheezing or shortness of breath. 05/01/17   Charlesetta Shanks, MD  amoxicillin-clavulanate (AUGMENTIN) 875-125 MG tablet Take 1 tablet by mouth every 12 (twelve) hours. 12/05/18   Vanessa Kick, MD  aspirin EC 325 MG tablet Take 1 tablet (325 mg total) by mouth daily. 04/05/17   Nicholes Stairs, MD  cetirizine (ZYRTEC) 10 MG tablet Take 1 tablet (10 mg total) by mouth daily. 03/26/18   Tasia Catchings, Amy V, PA-C  EPINEPHrine 0.3 mg/0.3 mL IJ SOAJ injection Inject 0.3 mg into the muscle once as needed (allergic reactions).     [provider]  fluticasone (FLONASE) 50 MCG/ACT nasal spray Place 2 sprays into both nostrils daily. 06/08/18   Raylene Everts, MD  Fluticasone-Salmeterol (ADVAIR) 250-50 MCG/DOSE AEPB Inhale 1 puff into the lungs 2 (two) times daily.    [provider]  gabapentin (NEURONTIN) 300 MG capsule Take 300 mg by mouth 3 (three) times daily.    [provider]  metoprolol tartrate (LOPRESSOR) 25 MG tablet Take 25 mg by mouth 2 (two) times daily.    [provider]  montelukast (SINGULAIR) 10 MG tablet Take 10 mg by mouth every evening.     [provider]  Multiple Vitamin (MULTIVITAMIN WITH MINERALS) TABS tablet Take 1 tablet by mouth daily.    [provider]  omeprazole (PRILOSEC) 40 MG capsule Take 1 capsule (40 mg total) by mouth 2 (two) times daily before a meal. 11/17/16 07/15/17  Nandigam, Venia Minks, MD  ondansetron (ZOFRAN-ODT) 4 MG disintegrating tablet Take 1 tablet (4 mg total) by mouth every 8 (eight) hours as needed for nausea or vomiting. 05/24/18   Zigmund Gottron, NP  predniSONE (DELTASONE) 50 MG tablet Take 1 tablet (50 mg total) by mouth daily. 11/14/18   Tasia Catchings, Amy V, PA-C  ranitidine (ZANTAC) 150 MG tablet Take 1 tablet (150 mg total) by mouth 2 (two) times daily. 06/08/18   Raylene Everts, MD  sitaGLIPtin-metformin (JANUMET) 50-500 MG tablet Take 1 tablet by mouth daily.    [provider]     Family History Family History  Problem Relation Age of Onset  . Diabetes Father     Social History Social History   Tobacco Use  . Smoking status: Former Research scientist (life sciences)  . Smokeless tobacco: Never Used  Substance Use Topics  . Alcohol use: No  . Drug use: No     Allergies   Crab [shellfish allergy]; Erythromycin; Fish allergy; Metoclopramide; Doxycycline; Levaquin [levofloxacin in d5w]; Morphine and related; and Sulfa antibiotics   Review of Systems Review of Systems  Constitutional: Positive for diaphoresis and fatigue. Negative for chills and fever.  Eyes: Negative for visual disturbance.  Respiratory: Negative for cough and shortness of breath.   Cardiovascular:  Positive for near-syncope. Negative for chest pain.  Gastrointestinal: Positive for nausea. Negative for abdominal pain, constipation, diarrhea and vomiting.  Genitourinary: Negative for dysuria and hematuria.  Musculoskeletal: Negative for arthralgias and myalgias.  Skin: Negative for color change.  Allergic/Immunologic: Positive for immunocompromised state (DM2).  Neurological: Positive for weakness, light-headedness and headaches. Negative for numbness.  Psychiatric/Behavioral: Negative for confusion.   All other systems reviewed and are negative for acute change except as noted in the HPI.    Physical Exam Updated Vital Signs BP 125/83 (BP Location: Right Arm)   Pulse 80   Temp 98.5 F (36.9 C) (Oral)   Resp 18   SpO2 97%   Physical Exam Vitals signs and nursing note reviewed.  Constitutional:      General: She is not in acute distress.    Appearance: Normal appearance. She is well-developed. She is not toxic-appearing.     Comments: Afebrile, nontoxic, NAD  HENT:     Head: Normocephalic and atraumatic.     Mouth/Throat:     Mouth: Mucous membranes are dry.     Comments: Lips mildly dry Eyes:     General: Vision grossly intact.        Right eye: No discharge.        Left eye: No discharge.      Extraocular Movements: Extraocular movements intact.     Conjunctiva/sclera: Conjunctivae normal.     Pupils: Pupils are equal, round, and reactive to light.     Comments: PERRL, EOMI, no nystagmus  Neck:     Musculoskeletal: Normal range of motion and neck supple. Normal range of motion. No neck rigidity, spinous process tenderness or muscular tenderness.     Comments: FROM intact without spinous process TTP, no bony stepoffs or deformities, no paraspinous muscle TTP or muscle spasms. No rigidity or meningeal signs. No bruising or swelling.  Cardiovascular:     Rate and Rhythm: Normal rate and regular rhythm.     Pulses: Normal pulses.     Heart sounds: Normal heart sounds, S1 normal and S2 normal. No murmur. No friction rub. No gallop.   Pulmonary:     Effort: Pulmonary effort is normal. No respiratory distress.     Breath sounds: Normal breath sounds. No decreased breath sounds, wheezing, rhonchi or rales.  Abdominal:     General: Bowel sounds are normal. There is no distension.     Palpations: Abdomen is soft. Abdomen is not rigid.     Tenderness: There is no abdominal tenderness. There is no right CVA tenderness, left CVA tenderness, guarding or rebound. Negative signs include Murphy's sign and McBurney's sign.  Musculoskeletal: Normal range of motion.     Comments: MAE x4 Strength and sensation grossly intact in all extremities Distal pulses intact Gait steady  Skin:    General: Skin is warm and dry.     Findings: No rash.  Neurological:     General: No focal deficit present.     Mental Status: She is alert and oriented to person, place, and time.     GCS: GCS eye subscore is 4. GCS verbal subscore is 5. GCS motor subscore is 6.     Cranial Nerves: Cranial nerves are intact. No cranial nerve deficit.     Sensory: Sensation is intact. No sensory deficit.     Motor: Motor function is intact.     Coordination: Coordination is intact. Coordination normal.     Gait: Gait normal.      Comments:  CN 2-12 grossly intact A&O x4 GCS 15 Sensation and strength intact Gait nonataxic including with tandem walking Coordination with finger-to-nose WNL Neg pronator drift   Psychiatric:        Mood and Affect: Mood and affect normal.        Behavior: Behavior normal.     ORTHOSTATICS: (after fluids given) Orthostatic VS for the past 24 hrs:  BP- Lying Pulse- Lying BP- Sitting Pulse- Sitting BP- Standing at 0 minutes Pulse- Standing at 0 minutes  02/02/19 1557 115/77 88 129/84 82 (!) 146/96 98     ED Treatments / Results  Labs (all labs ordered are listed, but only abnormal results are displayed) Labs Reviewed  CBC WITH DIFFERENTIAL/PLATELET - Abnormal; Notable for the following components:      Result Value   RBC 5.37 (*)    Hemoglobin 15.2 (*)    HCT 48.3 (*)    All other components within normal limits  URINALYSIS, ROUTINE W REFLEX MICROSCOPIC - Abnormal; Notable for the following components:   Glucose, UA 50 (*)    All other components within normal limits  BASIC METABOLIC PANEL - Abnormal; Notable for the following components:   Glucose, Bld 172 (*)    All other components within normal limits  CBG MONITORING, ED - Abnormal; Notable for the following components:   Glucose-Capillary 160 (*)    All other components within normal limits  I-STAT TROPONIN, ED    EKG EKG Interpretation  Date/Time:  Saturday February 02 2019 14:30:17 EST Ventricular Rate:  74 PR Interval:    QRS Duration: 100 QT Interval:  403 QTC Calculation: 448 R Axis:   -28 Text Interpretation:  Sinus rhythm Borderline left axis deviation Confirmed by Lennice Sites [phone]) on 02/02/2019 3:00:41 PM   Radiology Ct Head Wo Contrast  Result Date: 02/02/2019 CLINICAL DATA:  Vertigo, lightheadedness EXAM: CT HEAD WITHOUT CONTRAST TECHNIQUE: Contiguous axial images were obtained from the base of the skull through the vertex without intravenous contrast. COMPARISON:  None. FINDINGS:  Brain: There is artifact partially obscuring the left posterior parietal lobe and to lesser extent right posterior parietal lobe secondary to metallic hardware in the patient's hair. No evidence of acute infarction, hemorrhage, hydrocephalus, extra-axial collection or mass lesion/mass effect. Vascular: No hyperdense vessel or unexpected calcification. Skull: Normal. Negative for fracture or focal lesion. Sinuses/Orbits: No acute finding. Other: None. IMPRESSION: Artifact partially obscuring the left posterior parietal lobe and to lesser extent right posterior parietal lobe secondary to metallic hardware in the patient's hair. 1. No acute intracranial pathology. Electronically Signed   By: Kathreen Devoid   On: 02/02/2019 15:34    Procedures Procedures (including critical care time)  Medications Ordered in ED Medications  ondansetron [facility]) injection 4 mg (0 mg Intravenous Hold 02/02/19 1503)  sodium chloride 0.9 % bolus 2,000 mL (2,000 mLs Intravenous New Bag/Given 02/02/19 1503)     Initial Impression / Assessment and Plan / ED Course  I have reviewed the triage vital signs and the nursing notes.  Pertinent labs & imaging results that were available during my care of the patient were reviewed by me and considered in my medical decision making (see chart for details).        63 y.o. female here with likely vasovagal near syncope.  States that she felt like passing out, got hot and diaphoretic, nauseated, generally weak feeling, and felt thirsty while standing in line at a grill.  Standing seems to make her symptoms worse, laying down  improves it.  She mentions that recently she had a chest x-ray which showed scarring on her lungs, then later she was put on antibiotics for possible pneumonia which she has finished, and then yesterday she had a CT scan of her chest in [facility] but has not received the results yet.  She states that her cough is improving.  On exam, no focal neuro deficits, clear  lung exam, lips mildly dry, gait steady.  Suspect vasovagal near syncope.  EKG nonischemic without acute abnormalities. Will get orthostatics, labs, CT head, and give fluids and zofran and reassess.  4:36 PM Orthostatics unremarkable after fluids given. CBC w/diff hemoconcentrated but otherwise WNL. BMP with gluc 172 but otherwise WNL. Trop neg. U/A unremarkable. CT head negative. Pt feeling better, wants to eat and is ready to go. It's likely that she had a vasovagal near syncopal episode, possibly from being dehydrated. Pt able to ambulate here without difficulty and appears stable for d/c. Will send home with zofran, advised staying hydrated, use tylenol/motrin for any pain, and f/up with PCP in 1wk for recheck. Strict return precautions advised. I explained the diagnosis and have given explicit precautions to return to the ER including for any other new or worsening symptoms. The patient understands and accepts the medical plan as it's been dictated and I have answered their questions. Discharge instructions concerning home care and prescriptions have been given. The patient is STABLE and is discharged to home in good condition.    Final Clinical Impressions(s) / ED Diagnoses   Final diagnoses:  Vasovagal near syncope  Nausea  Dehydration    ED Discharge Orders         Ordered    ondansetron (ZOFRAN ODT) 4 MG disintegrating tablet  Every 8 hours PRN     02/02/19 [address] [place], PA-C 02/02/19 [place], [place], DO 02/03/19 [phone]

## 2019-02-02 NOTE — Discharge Instructions (Signed)
Your work up today has been reassuring. You likely had a vasovagal near syncopal reaction while you were at the mall today. It's possible you were dehydrated. Stay very well hydrated. Use zofran as directed as needed for nausea. Alternate between tylenol and ibuprofen as needed for any pain you may have. Follow up with your regular doctor in 5-7 days for recheck of symptoms. Return to the ER for emergent changes or worsening symptoms.

## 2019-02-02 NOTE — ED Notes (Signed)
Patient transported to CT 

## 2019-02-02 NOTE — ED Notes (Signed)
Unable to draw blood. Phlebotomist to come and draw

## 2019-02-02 NOTE — ED Notes (Signed)
Patient verbalizes understanding of discharge instructions. Opportunity for questioning and answers were provided. Armband removed by staff, pt discharged from ED.  

## 2019-02-16 ENCOUNTER — Encounter (HOSPITAL_COMMUNITY): Payer: Self-pay | Admitting: Emergency Medicine

## 2019-02-16 ENCOUNTER — Other Ambulatory Visit: Payer: Self-pay

## 2019-02-16 ENCOUNTER — Ambulatory Visit (HOSPITAL_COMMUNITY)
Admission: EM | Admit: 2019-02-16 | Discharge: 2019-02-16 | Disposition: A | Discharge status: Home / Self Care | Payer: Medicare HMO | Attending: Internal Medicine | Admitting: Internal Medicine

## 2019-02-16 DIAGNOSIS — J069 Acute upper respiratory infection, unspecified: Secondary | ICD-10-CM | POA: Diagnosis not present

## 2019-02-16 LAB — POCT INFLUENZA A/B
Influenza A, POC: NEGATIVE
Influenza B, POC: NEGATIVE

## 2019-02-16 MED ORDER — SODIUM CHLORIDE 0.9 % IV BOLUS
1000.0000 mL | Freq: Once | INTRAVENOUS | Status: AC
  Administered 2019-02-16: 1000 mL via INTRAVENOUS

## 2019-02-16 MED ORDER — ACETAMINOPHEN 325 MG PO TABS
650.0000 mg | ORAL_TABLET | Freq: Once | ORAL | Status: AC
  Administered 2019-02-16: 650 mg via ORAL

## 2019-02-16 MED ORDER — ACETAMINOPHEN 325 MG PO TABS
ORAL_TABLET | ORAL | Status: AC
  Filled 2019-02-16: qty 2

## 2019-02-16 NOTE — ED Notes (Signed)
ivf continue

## 2019-02-16 NOTE — ED Triage Notes (Signed)
Body aches, headache, ear pain.  Vomiting on Thursday, none now

## 2019-02-16 NOTE — ED Provider Notes (Signed)
[place]    CSN: [id number] Arrival date & time: 02/16/19  1151     History   Chief Complaint Chief Complaint  Patient presents with  . URI    HPI Pamela Roberson is a 63 y.o. female with a history of diabetes mellitus type 2 comes to the urgent care with 3-day history of global headache, fever, sore throat and generalized body pain.  Symptoms started fairly abruptly and is gotten progressively worse.  Cough is not productive of any sputum.  Sore throat is mild.  No nausea or vomiting.  No diarrhea.  Patient is a diabetic and she also uses bronchodilator treatments possibly for COPD.  Patient admits to feeling very weak and faint with some lightheadedness.   HPI  Past Medical History:  Diagnosis Date  . Arthritis   . Diabetes mellitus without complication ([place])    type II   . Diverticulitis   . Dyspnea    mild   . GERD (gastroesophageal reflux disease)   . History of bronchitis   . Hypertension   . IBS (irritable bowel syndrome)   . Palpitations   . Sleep apnea    mild but no sleep apnea   . TMJ (dislocation of temporomandibular joint)   . 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 Biceps tendinitis of right shoulder 04/05/2017  . Impingement syndrome of right shoulder 04/05/2017  . Arthritis of right acromioclavicular joint 04/05/2017  . Partial nontraumatic tear of rotator cuff, right 04/05/2017    Past Surgical History:  Procedure Laterality Date  . ABDOMINAL HYSTERECTOMY    . ESOPHAGOGASTRODUODENOSCOPY ENDOSCOPY     with esophagus being stretched twice per pt,   . EYE SURGERY    . 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 feeding tube placement and removal   2015  . NASAL SEPTUM SURGERY    . spurs removed from esophagus   2015  . TONSILLECTOMY      OB History   No obstetric history on file.      Home Medications    Prior to Admission medications   Medication Sig Start Date  End Date Taking? Authorizing Provider  acyclovir (ZOVIRAX) 400 MG tablet Take 400 mg by mouth 2 (two) times daily.    [provider]  albuterol (PROVENTIL HFA;VENTOLIN HFA) 108 (90 BASE) MCG/ACT inhaler Inhale 2 puffs into the lungs every 4 (four) hours as needed for wheezing or shortness of breath.     [provider]  albuterol (PROVENTIL) (2.5 MG/3ML) 0.083% nebulizer solution Take 3 mLs (2.5 mg total) by nebulization every 4 (four) hours as needed for wheezing or shortness of breath. 05/01/17   Charlesetta Shanks, MD  Artificial Tear Ointment (DRY EYES OP) Apply 1-2 drops to eye daily as needed (for dry eyes).    [provider]  aspirin EC 325 MG tablet Take 1 tablet (325 mg total) by mouth daily. Patient not taking: Reported on 02/02/2019 04/05/17   Nicholes Stairs, MD  atorvastatin (LIPITOR) 10 MG tablet Take 10 mg by mouth daily.    [provider]  cetirizine (ZYRTEC) 10 MG tablet Take 1 tablet (10 mg total) by mouth daily. Patient not taking: Reported on 02/02/2019 03/26/18   Ok Edwards, PA-C  dexlansoprazole (DEXILANT) 60 MG capsule Take 60 mg by mouth daily.    [provider]  EPINEPHrine 0.3  mg/0.3 mL IJ SOAJ injection Inject 0.3 mg into the muscle once as needed (allergic reactions).     [provider]  fluticasone (FLONASE) 50 MCG/ACT nasal spray Place 2 sprays into both nostrils daily. 06/08/18   Raylene Everts, MD  Fluticasone-Salmeterol (ADVAIR) 250-50 MCG/DOSE AEPB Inhale 1 puff into the lungs 2 (two) times daily.    [provider]  gabapentin (NEURONTIN) 300 MG capsule Take 300 mg by mouth 3 (three) times daily.    [provider]  loratadine (CLARITIN) 10 MG tablet Take 10 mg by mouth daily.    [provider]  metoprolol tartrate (LOPRESSOR) 25 MG tablet Take 25 mg by mouth 2 (two) times daily.    [provider]  montelukast (SINGULAIR) 10 MG tablet Take 10 mg by mouth every evening.      [provider]  Multiple Vitamin (MULTIVITAMIN WITH MINERALS) TABS tablet Take 1 tablet by mouth daily.    [provider]  omeprazole (PRILOSEC) 40 MG capsule Take 1 capsule (40 mg total) by mouth 2 (two) times daily before a meal. Patient not taking: Reported on 02/02/2019 11/17/16 07/15/17  Mauri Pole, MD  ondansetron (ZOFRAN ODT) 4 MG disintegrating tablet Take 1 tablet (4 mg total) by mouth every 8 (eight) hours as needed for nausea or vomiting. 02/02/19   Street, [employer], PA-C  ondansetron (ZOFRAN-ODT) 4 MG disintegrating tablet Take 1 tablet (4 mg total) by mouth every 8 (eight) hours as needed for nausea or vomiting. 05/24/18   Zigmund Gottron, NP  ranitidine (ZANTAC) 150 MG tablet Take 1 tablet (150 mg total) by mouth 2 (two) times daily. Patient not taking: Reported on 02/02/2019 06/08/18   Raylene Everts, MD  sitaGLIPtin-metformin (JANUMET) 50-500 MG tablet Take 1 tablet by mouth daily.    [provider]    Family History Family History  Problem Relation Age of Onset  . Diabetes Father     Social History Social History   Tobacco Use  . Smoking status: Former Research scientist (life sciences)  . Smokeless tobacco: Never Used  Substance Use Topics  . Alcohol use: No  . Drug use: No     Allergies   Crab [shellfish allergy]; Erythromycin; Fish allergy; Metoclopramide; Doxycycline; Levaquin [levofloxacin in d5w]; Morphine; Morphine and related; and Sulfa antibiotics   Review of Systems Review of Systems  Constitutional: Positive for activity change, appetite change, chills, fatigue and fever. Negative for diaphoresis.  HENT: Positive for congestion and rhinorrhea. Negative for ear discharge, ear pain, hearing loss and postnasal drip.   Respiratory: Positive for cough, chest tightness and shortness of breath. Negative for wheezing.   Gastrointestinal: Negative for abdominal distention, abdominal pain, nausea and vomiting.  Neurological: Positive for dizziness,  weakness, light-headedness and headaches. Negative for syncope and numbness.  Psychiatric/Behavioral: Negative for agitation, confusion and decreased concentration.  All other systems reviewed and are negative.    Physical Exam Triage Vital Signs ED Triage Vitals  Enc Vitals Group     BP 02/16/19 1253 128/75     Pulse Rate 02/16/19 1253 (!) 117     Resp 02/16/19 1253 18     Temp 02/16/19 1253 (!) 100.9 F (38.3 C)     Temp Source 02/16/19 1253 Oral     SpO2 02/16/19 1253 98 %     Weight --      Height --      Head Circumference --      Peak Flow --  Pain Score 02/16/19 1251 8     Pain Loc --      Pain Edu? --      Excl. in [place]? --    No data found.  Updated Vital Signs BP 128/75 (BP Location: Left Arm) Comment (BP Location): large cuff  Pulse (!) 117   Temp (!) 100.9 F (38.3 C) (Oral)   Resp 18   SpO2 98%   Visual Acuity Right Eye Distance:   Left Eye Distance:   Bilateral Distance:    Right Eye Near:   Left Eye Near:    Bilateral Near:     Physical Exam Constitutional:      General: She is in acute distress.     Appearance: She is ill-appearing. She is not toxic-appearing.  HENT:     Right Ear: Tympanic membrane normal.     Left Ear: Tympanic membrane normal.     Nose: Nose normal.     Mouth/Throat:     Mouth: Mucous membranes are moist.     Pharynx: Posterior oropharyngeal erythema present.  Eyes:     Conjunctiva/sclera: Conjunctivae normal.  Neck:     Musculoskeletal: No neck rigidity.  Cardiovascular:     Rate and Rhythm: Tachycardia present.     Pulses: Normal pulses.     Heart sounds: Normal heart sounds.  Pulmonary:     Effort: Pulmonary effort is normal.     Breath sounds: Normal breath sounds. No wheezing, rhonchi or rales.  Abdominal:     General: Bowel sounds are normal.     Palpations: Abdomen is soft.  Musculoskeletal: Normal range of motion.     Right lower leg: No edema.  Lymphadenopathy:     Cervical: No cervical adenopathy.   Skin:    General: Skin is warm.     Capillary Refill: Capillary refill takes less than 2 seconds.     Findings: No rash.  Neurological:     General: No focal deficit present.     Mental Status: She is alert and oriented to person, place, and time.      UC Treatments / Results  Labs (all labs ordered are listed, but only abnormal results are displayed) Labs Reviewed  POCT INFLUENZA A/B    EKG None  Radiology No results found.  Procedures Procedures (including critical care time)  Medications Ordered in UC Medications  sodium chloride 0.9 % bolus 1,000 mL (has no administration in time range)  acetaminophen (TYLENOL) tablet 650 mg (650 mg Oral Given 02/16/19 1306)    Initial Impression / Assessment and Plan / UC Course  I have reviewed the triage vital signs and the nursing notes.  Pertinent labs & imaging results that were available during my care of the patient were reviewed by me and considered in my medical decision making (see chart for details).     1.  Acute viral pharyngitis: Rapid flu test is negative Normal saline 1 L bolus Tylenol/Motrin for fever and body aches  Final Clinical Impressions(s) / UC Diagnoses   Final diagnoses:  None   Discharge Instructions   None    ED Prescriptions    None     Controlled Substance Prescriptions  Controlled Substance Registry consulted? No   Chase Picket, MD 02/16/19 1530

## 2019-02-16 NOTE — ED Notes (Signed)
Reposition left hand.  ivf infusing without difficulty when wrist not bent.  Reminded patient of this

## 2019-02-16 NOTE — ED Notes (Signed)
Patient keeps unintentionally bending wrist and slowing flow of ivf.  Explained to dr lamptey.  DC iv.  Understands 500 cc completed of 1 liter

## 2019-02-26 ENCOUNTER — Ambulatory Visit: Payer: Medicare HMO | Admitting: Podiatry

## 2019-03-11 ENCOUNTER — Ambulatory Visit (HOSPITAL_COMMUNITY)
Admission: EM | Admit: 2019-03-11 | Discharge: 2019-03-11 | Disposition: A | Discharge status: Home / Self Care | Payer: Medicare HMO | Attending: Family Medicine | Admitting: Family Medicine

## 2019-03-11 ENCOUNTER — Other Ambulatory Visit: Payer: Self-pay

## 2019-03-11 ENCOUNTER — Encounter (HOSPITAL_COMMUNITY): Payer: Self-pay | Admitting: Emergency Medicine

## 2019-03-11 DIAGNOSIS — J22 Unspecified acute lower respiratory infection: Secondary | ICD-10-CM | POA: Diagnosis not present

## 2019-03-11 MED ORDER — ALBUTEROL SULFATE (2.5 MG/3ML) 0.083% IN NEBU: 2.5000 mg | INHALATION_SOLUTION | RESPIRATORY_TRACT | 0 refills | Status: DC | PRN

## 2019-03-11 MED ORDER — BENZONATATE 200 MG PO CAPS: 200.0000 mg | ORAL_CAPSULE | Freq: Two times a day (BID) | ORAL | 0 refills | Status: DC | PRN

## 2019-03-11 MED ORDER — AMOXICILLIN 875 MG PO TABS: 875.0000 mg | ORAL_TABLET | Freq: Two times a day (BID) | ORAL | 0 refills | Status: DC

## 2019-03-11 NOTE — ED Triage Notes (Signed)
Pt here for cough and congestion x 3 weeks with some productive cough

## 2019-03-11 NOTE — ED Provider Notes (Signed)
[place]    CSN: [id number] Arrival date & time: 03/11/19  1914     History   Chief Complaint Chief Complaint  Patient presents with  . Cough    HPI Pamela Roberson is a 63 y.o. female.   HPI  Patient is here for continued cough.  She was seen by Dr. Brent [facility] for a cough couple weeks ago.  She has continued coughing and chest congestion.  Postnasal drip.  She states she is coughing up sputum.  The sputum is brown with blood streaks.  She has spells where she feels like the sputum is "stuck" in her chest or in her throat.  No shortness of breath no sweats or chills no fever.  She does not think she is had any exposure to illness such as influenza, upper respiratory infection, COVID-19.  She states that when she gets these "bad bronchitis" she does not get better without an antibiotic.  She has been sick for more than 3 weeks and has a change in her sputum.  I think with underlying COPD it is reasonable to try a course of antibiotics.  I did note her allergies and she was amoxicillin  Past Medical History:  Diagnosis Date  . Arthritis   . Diabetes mellitus without complication ([place])    type II   . Diverticulitis   . Dyspnea    mild   . GERD (gastroesophageal reflux disease)   . History of bronchitis   . Hypertension   . IBS (irritable bowel syndrome)   . Palpitations   . Sleep apnea    mild but no sleep apnea   . TMJ (dislocation of temporomandibular joint)   . 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 Biceps tendinitis of right shoulder 04/05/2017  . Impingement syndrome of right shoulder 04/05/2017  . Arthritis of right acromioclavicular joint 04/05/2017  . Partial nontraumatic tear of rotator cuff, right 04/05/2017    Past Surgical History:  Procedure Laterality Date  . ABDOMINAL HYSTERECTOMY    . ESOPHAGOGASTRODUODENOSCOPY ENDOSCOPY     with esophagus being stretched twice per pt,   . EYE  SURGERY    . 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 feeding tube placement and removal   2015  . NASAL SEPTUM SURGERY    . spurs removed from esophagus   2015  . TONSILLECTOMY      OB History   No obstetric history on file.      Home Medications    Prior to Admission medications   Medication Sig Start Date End Date Taking? Authorizing Provider  acyclovir (ZOVIRAX) 400 MG tablet Take 400 mg by mouth 2 (two) times daily.    [provider]  albuterol (PROVENTIL HFA;VENTOLIN HFA) 108 (90 BASE) MCG/ACT inhaler Inhale 2 puffs into the lungs every 4 (four) hours as needed for wheezing or shortness of breath.     [provider]  albuterol (PROVENTIL) (2.5 MG/3ML) 0.083% nebulizer solution Take 3 mLs (2.5 mg total) by nebulization every 4 (four) hours as needed for wheezing or shortness of breath. 03/11/19   Raylene Everts, MD  amoxicillin (AMOXIL) 875 MG tablet Take 1 tablet (875 mg total) by mouth 2 (two) times daily. 03/11/19   Raylene Everts, MD  Artificial Tear Ointment (DRY EYES OP) Apply 1-2 drops to eye daily as needed (for dry eyes).  [provider]  atorvastatin (LIPITOR) 10 MG tablet Take 10 mg by mouth daily.    [provider]  benzonatate (TESSALON) 200 MG capsule Take 1 capsule (200 mg total) by mouth 2 (two) times daily as needed for cough. 03/11/19   Raylene Everts, MD  dexlansoprazole (DEXILANT) 60 MG capsule Take 60 mg by mouth daily.    [provider]  EPINEPHrine 0.3 mg/0.3 mL IJ SOAJ injection Inject 0.3 mg into the muscle once as needed (allergic reactions).     [provider]  fluticasone (FLONASE) 50 MCG/ACT nasal spray Place 2 sprays into both nostrils daily. 06/08/18   Raylene Everts, MD  Fluticasone-Salmeterol (ADVAIR) 250-50 MCG/DOSE AEPB Inhale 1 puff into the lungs 2 (two) times daily.    [provider]  gabapentin (NEURONTIN) 300 MG capsule Take 300 mg by mouth 3 (three) times daily.     [provider]  loratadine (CLARITIN) 10 MG tablet Take 10 mg by mouth daily.    [provider]  metoprolol tartrate (LOPRESSOR) 25 MG tablet Take 25 mg by mouth 2 (two) times daily.    [provider]  montelukast (SINGULAIR) 10 MG tablet Take 10 mg by mouth every evening.     [provider]  Multiple Vitamin (MULTIVITAMIN WITH MINERALS) TABS tablet Take 1 tablet by mouth daily.    [provider]  sitaGLIPtin-metformin (JANUMET) 50-500 MG tablet Take 1 tablet by mouth daily.    [provider]    Family History Family History  Problem Relation Age of Onset  . Diabetes Father     Social History Social History   Tobacco Use  . Smoking status: Former Research scientist (life sciences)  . Smokeless tobacco: Never Used  Substance Use Topics  . Alcohol use: No  . Drug use: No     Allergies   Crab [shellfish allergy]; Erythromycin; Fish allergy; Metoclopramide; Doxycycline; Levaquin [levofloxacin in d5w]; Morphine; Morphine and related; and Sulfa antibiotics   Review of Systems Review of Systems  Constitutional: Negative for chills and fever.  HENT: Positive for congestion. Negative for ear pain and sore throat.   Eyes: Negative for pain and visual disturbance.  Respiratory: Positive for cough and shortness of breath.   Cardiovascular: Negative for chest pain and palpitations.  Gastrointestinal: Negative for abdominal pain and vomiting.  Genitourinary: Negative for dysuria and hematuria.  Musculoskeletal: Negative for arthralgias and back pain.  Skin: Negative for color change and rash.  Neurological: Negative for seizures and syncope.  All other systems reviewed and are negative.    Physical Exam Triage Vital Signs ED Triage Vitals  Enc Vitals Group     BP 03/11/19 1933 (!) 157/82     Pulse Rate 03/11/19 1933 97     Resp 03/11/19 1933 18     Temp 03/11/19 1933 (!) 97.4 F (36.3 C)     Temp Source 03/11/19 1933 Temporal     SpO2  03/11/19 1933 100 %     Weight --      Height --      Head Circumference --      Peak Flow --      Pain Score 03/11/19 1934 5     Pain Loc --      Pain Edu? --      Excl. in [place]? --    No data found.  Updated Vital Signs BP (!) 157/82 (BP Location: Right Arm)   Pulse 97   Temp (!) 97.4 F (  36.3 C) (Temporal)   Resp 18   SpO2 100%      Physical Exam Constitutional:      General: She is not in acute distress.    Appearance: She is well-developed. She is obese.  HENT:     Head: Normocephalic and atraumatic.     Right Ear: Tympanic membrane and ear canal normal.     Left Ear: Tympanic membrane and ear canal normal.     Nose: Nose normal. No congestion.  Eyes:     Conjunctiva/sclera: Conjunctivae normal.     Pupils: Pupils are equal, round, and reactive to light.  Neck:     Musculoskeletal: Normal range of motion.  Cardiovascular:     Rate and Rhythm: Normal rate and regular rhythm.     Heart sounds: Normal heart sounds.  Pulmonary:     Effort: Pulmonary effort is normal. No respiratory distress.     Breath sounds: Rhonchi present.     Comments: Few central rhonchi Abdominal:     General: There is no distension.     Palpations: Abdomen is soft.  Musculoskeletal: Normal range of motion.  Skin:    General: Skin is warm and dry.  Neurological:     Mental Status: She is alert.  Psychiatric:        Mood and Affect: Mood normal.        Behavior: Behavior normal.      UC Treatments / Results  Labs (all labs ordered are listed, but only abnormal results are displayed) Labs Reviewed - No data to display  EKG None  Radiology No results found.  Procedures Procedures (including critical care time)  Medications Ordered in UC Medications - No data to display  Initial Impression / Assessment and Plan / UC Course  I have reviewed the triage vital signs and the nursing notes.  Pertinent labs & imaging results that were available during my care of the patient were  reviewed by me and considered in my medical decision making (see chart for details).     Final Clinical Impressions(s) / UC Diagnoses   Final diagnoses:  LRTI (lower respiratory tract infection)     Discharge Instructions     Drink plenty of fluids Take the antibiotic 2 x a day Use the nebulizer every 4-6 hours Continue the advair and the singulair Take tessalon as needed for cough Call your doctor if not improving in a few days   ED Prescriptions    Medication Sig Dispense Auth. Provider   amoxicillin (AMOXIL) 875 MG tablet Take 1 tablet (875 mg total) by mouth 2 (two) times daily. 14 tablet Raylene Everts, MD   benzonatate (TESSALON) 200 MG capsule Take 1 capsule (200 mg total) by mouth 2 (two) times daily as needed for cough. 20 capsule Raylene Everts, MD   albuterol (PROVENTIL) (2.5 MG/3ML) 0.083% nebulizer solution Take 3 mLs (2.5 mg total) by nebulization every 4 (four) hours as needed for wheezing or shortness of breath. 30 vial Raylene Everts, MD     Controlled Substance Prescriptions [place] Controlled Substance Registry consulted? Not Applicable   Raylene Everts, MD 03/11/19 2001

## 2019-03-11 NOTE — Discharge Instructions (Addendum)
Drink plenty of fluids Take the antibiotic 2 x a day Use the nebulizer every 4-6 hours Continue the advair and the singulair Take tessalon as needed for cough Call your doctor if not improving in a few days

## 2019-03-12 ENCOUNTER — Ambulatory Visit (INDEPENDENT_AMBULATORY_CARE_PROVIDER_SITE_OTHER): Payer: Medicare HMO | Admitting: Podiatry

## 2019-03-12 VITALS — BP 126/76 | HR 76 | Temp 97.7°F

## 2019-03-12 DIAGNOSIS — B351 Tinea unguium: Secondary | ICD-10-CM | POA: Diagnosis not present

## 2019-03-12 DIAGNOSIS — L84 Corns and callosities: Secondary | ICD-10-CM

## 2019-03-12 DIAGNOSIS — M79675 Pain in left toe(s): Secondary | ICD-10-CM | POA: Diagnosis not present

## 2019-03-12 DIAGNOSIS — M79674 Pain in right toe(s): Secondary | ICD-10-CM

## 2019-03-12 DIAGNOSIS — E1142 Type 2 diabetes mellitus with diabetic polyneuropathy: Secondary | ICD-10-CM | POA: Diagnosis not present

## 2019-03-13 ENCOUNTER — Encounter: Payer: Self-pay | Admitting: Podiatry

## 2019-03-13 NOTE — Progress Notes (Signed)
Subjective: Pamela Roberson presents today referred by [facility] [place] with h/o diabetes. She is wanting to establish Podiatric care here. She has seen a Podiatrist in the past for her preventative care.   Patient relates elongated,  painful, discolored, thick toenails and plantar calluses b/l feet which interfere with daily activities.  Pain is aggravated when wearing enclosed shoe gear. Pain is relieved with periodic professional debridement.  She does relate tingling and numbness in both feet.   Pamela Roberson is requesting diabetic shoes on today's visit.  Past Medical History:  Diagnosis Date  . Arthritis   . Diabetes mellitus without complication ([place])    type II   . Diverticulitis   . Dyspnea    mild   . GERD (gastroesophageal reflux disease)   . History of bronchitis   . Hypertension   . IBS (irritable bowel syndrome)   . Palpitations   . Sleep apnea    mild but no sleep apnea   . TMJ (dislocation of temporomandibular joint)   . 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 Dizzy 12/11/2017  . Pharyngeal dysphagia 12/11/2017  . Post-nasal drip 12/11/2017  . Referred ear pain, bilateral 12/11/2017  . Diabetes mellitus without complication ([place]) 44/81/8563  . Family history of glaucoma 06/29/2017  . Hyperopia of both eyes with astigmatism and presbyopia 06/29/2017  . Keratoconjunctivitis sicca of both eyes not specified as Sjogren's 06/29/2017  . Nuclear sclerotic cataract of both eyes 06/29/2017  . Vitreous floater, bilateral 06/29/2017  . Biceps tendinitis of right shoulder 04/05/2017  . Impingement syndrome of right shoulder 04/05/2017  . Arthritis of right acromioclavicular joint 04/05/2017  . Partial nontraumatic tear of rotator cuff, right 04/05/2017    Past Surgical History:  Procedure Laterality Date  . ABDOMINAL HYSTERECTOMY    . ESOPHAGOGASTRODUODENOSCOPY ENDOSCOPY     with esophagus being  stretched twice per pt,   . EYE SURGERY    . 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 feeding tube placement and removal   2015  . NASAL SEPTUM SURGERY    . spurs removed from esophagus   2015  . TONSILLECTOMY       Current Outpatient Medications:  .  acyclovir (ZOVIRAX) 400 MG tablet, Take 400 mg by mouth 2 (two) times daily., Disp: , Rfl:  .  albuterol (PROVENTIL HFA;VENTOLIN HFA) 108 (90 BASE) MCG/ACT inhaler, Inhale 2 puffs into the lungs every 4 (four) hours as needed for wheezing or shortness of breath. , Disp: , Rfl:  .  albuterol (PROVENTIL) (2.5 MG/3ML) 0.083% nebulizer solution, Take 3 mLs (2.5 mg total) by nebulization every 4 (four) hours as needed for wheezing or shortness of breath., Disp: 30 vial, Rfl: 0 .  amoxicillin (AMOXIL) 875 MG tablet, Take 1 tablet (875 mg total) by mouth 2 (two) times daily., Disp: 14 tablet, Rfl: 0 .  Artificial Tear Ointment (DRY EYES OP), Apply 1-2 drops to eye daily as needed (for dry eyes)., Disp: , Rfl:  .  atorvastatin (LIPITOR) 10 MG tablet, Take 10 mg by mouth daily., Disp: , Rfl:  .  dexlansoprazole (DEXILANT) 60 MG capsule, Take 60 mg by mouth daily., Disp: , Rfl:  .  EPINEPHrine 0.3 mg/0.3 mL IJ SOAJ injection, Inject 0.3 mg into the muscle once as needed (allergic reactions). , Disp: , Rfl:  .  fluticasone (FLONASE) 50 MCG/ACT nasal spray, Place 2  sprays into both nostrils daily., Disp: 16 g, Rfl: 0 .  Fluticasone-Salmeterol (ADVAIR) 250-50 MCG/DOSE AEPB, Inhale 1 puff into the lungs 2 (two) times daily., Disp: , Rfl:  .  gabapentin (NEURONTIN) 300 MG capsule, Take 300 mg by mouth 3 (three) times daily., Disp: , Rfl:  .  loratadine (CLARITIN) 10 MG tablet, Take 10 mg by mouth daily., Disp: , Rfl:  .  metoprolol tartrate (LOPRESSOR) 25 MG tablet, Take 25 mg by mouth 2 (two) times daily., Disp: , Rfl:  .  montelukast (SINGULAIR) 10 MG tablet, Take 10 mg by mouth every evening. , Disp: , Rfl:  .  Multiple Vitamin (MULTIVITAMIN WITH  MINERALS) TABS tablet, Take 1 tablet by mouth daily., Disp: , Rfl:  .  naproxen (NAPROSYN) 500 MG tablet, , Disp: , Rfl:  .  Prodigy Twist Top Lancets 28G MISC, , Disp: , Rfl:  .  sitaGLIPtin-metformin (JANUMET) 50-500 MG tablet, Take 1 tablet by mouth daily., Disp: , Rfl:   Allergies  Allergen Reactions  . Crab [Shellfish Allergy] Shortness Of Breath and Swelling  . Erythromycin Swelling and Rash  . Fish Allergy     Swelling   . Metoclopramide Other (See Comments)    Slurred speech and unable to move muscles,  Caused her to drag her feet  . Doxycycline     GI Intolerance  . Levaquin [Levofloxacin In D5w] Other (See Comments)    Causes irregular heart beat  . Morphine Nausea And Vomiting and Rash    Irregular heart beat  . Morphine And Related Nausea And Vomiting and Rash    Irregular heart beat  . Sulfa Antibiotics Rash    Social History   Occupational History  . Not on file  Tobacco Use  . Smoking status: Former Research scientist (life sciences)  . Smokeless tobacco: Never Used  Substance and Sexual Activity  . Alcohol use: No  . Drug use: No  . Sexual activity: Not on file    Family History  Problem Relation Age of Onset  . Diabetes Father      There is no immunization history on file for this patient.  Review of systems: Positive Findings in bold print.  Constitutional:  chills, fatigue, fever, sweats, weight change Communication: Optometrist, sign Ecologist, hand writing, iPad/Android device Head: headaches, head injury Eyes: changes in vision, eye pain, glaucoma, cataracts, macular degeneration, diplopia, glare,  light sensitivity, eyeglasses or contacts, blindness Ears nose mouth throat: hearing impaired, hearing aids,  ringing in ears, deaf, sign language,  vertigo,   nosebleeds,  rhinitis,  cold sores, snoring, swollen glands Cardiovascular: HTN, edema, arrhythmia, pacemaker in place, defibrillator in place, chest pain/tightness, chronic anticoagulation, blood clot, heart  failure, MI Peripheral Vascular: leg cramps, varicose veins, blood clots, lymphedema, varicosities Respiratory:  difficulty breathing, denies congestion, SOB, wheezing, cough, emphysema Gastrointestinal: change in appetite or weight, abdominal pain, constipation, diarrhea, nausea, vomiting, vomiting blood, change in bowel habits, abdominal pain, jaundice, rectal bleeding, hemorrhoids, GERD Genitourinary:  nocturia,  pain on urination, polyuria,  blood in urine, Foley catheter, urinary urgency, ESRD on hemodialysis Musculoskeletal: amputation, cramping, stiff joints, painful joints, decreased joint motion, fractures, OA, gout, hemiplegia, paraplegia, uses cane, wheelchair bound, uses walker, uses rollator Skin: +changes in toenails, color change, dryness, itching, mole changes,  rash, wound(s) Neurological: headaches, numbness in feet, paresthesias in feet, burning in feet, fainting,  seizures, change in speech. denies headaches, memory problems/poor historian, cerebral palsy, weakness, paralysis, CVA, TIA Endocrine: diabetes, hypothyroidism, hyperthyroidism,  goiter, dry mouth, flushing,  heat intolerance,  cold intolerance,  excessive thirst, denies polyuria,  nocturia Hematological:  easy bleeding, excessive bleeding, easy bruising, enlarged lymph nodes, on long term blood thinner, history of past transusions Allergy/immunological:  hives, eczema, frequent infections, multiple drug allergies, seasonal allergies, transplant recipient, multiple food allergies Psychiatric:  anxiety, depression, mood disorder, suicidal ideations, hallucinations, insomnia  Objective: Vascular Examination: Capillary refill time immediate x 10 digits  Dorsalis pedis pulses 2/4 b/l  Posterior tibial pulses 1/4 b/l  Digital hair diminished x 10 digits  Skin temperature gradient WNL b/l  Dermatological Examination: Skin with normal turgor, texture and tone b/l  Toenails 1-5 b/l discolored, thick, dystrophic with  subungual debris and pain with palpation to nailbeds due to thickness of nails.  Hyperkeratotic lesions submet head 5 b/l. No erythema, no edema, no drainage, no flocculence noted.  Musculoskeletal: Muscle strength 5/5 to all LE muscle groups  HAV with bunion b/l  Neurological: Sensation intact with 10 gram monofilament  Vibratory sensation decreased b/l  Assessment: 1. Painful onychomycosis toenails 1-5 b/l  2. Calluses submet head 5 b/l 3. NIDDM with neuropathy  Plan: 1. Discussed diabetic foot care principles. Literature dispensed on today. 2. Toenails 1-5 b/l were debrided in length and girth without iatrogenic bleeding. 3. Patient to continue soft, supportive shoe gear. Will start process for diabetic shoes with diagnoses of NIDDM with neuropathy, calluses submet head 5 b/l and HAV with bunion deformity b/l. 4. Patient to report any pedal injuries to medical professional immediately. 5. Follow up 3 months.  6. Patient/POA to call should there be a concern in the interim.

## 2019-04-04 ENCOUNTER — Other Ambulatory Visit: Payer: Self-pay

## 2019-04-04 ENCOUNTER — Ambulatory Visit: Payer: Medicare HMO | Admitting: Orthotics

## 2019-04-04 ENCOUNTER — Telehealth: Payer: Self-pay | Admitting: Podiatry

## 2019-04-04 DIAGNOSIS — E1142 Type 2 diabetes mellitus with diabetic polyneuropathy: Secondary | ICD-10-CM

## 2019-04-04 DIAGNOSIS — L84 Corns and callosities: Secondary | ICD-10-CM

## 2019-04-04 NOTE — Progress Notes (Signed)

## 2019-04-04 NOTE — Telephone Encounter (Signed)
Pt called and seen Pamela Roberson today for diabetic shoe measurements and wants to make sure we order a wide shoe. I changed it on the order.

## 2019-04-17 ENCOUNTER — Other Ambulatory Visit: Payer: Self-pay

## 2019-04-17 ENCOUNTER — Encounter (HOSPITAL_COMMUNITY): Payer: Self-pay | Admitting: Emergency Medicine

## 2019-04-17 ENCOUNTER — Ambulatory Visit (HOSPITAL_COMMUNITY)
Admission: EM | Admit: 2019-04-17 | Discharge: 2019-04-17 | Disposition: A | Discharge status: Home / Self Care | Payer: Medicare HMO | Attending: Family Medicine | Admitting: Family Medicine

## 2019-04-17 ENCOUNTER — Ambulatory Visit (INDEPENDENT_AMBULATORY_CARE_PROVIDER_SITE_OTHER): Payer: Medicare HMO

## 2019-04-17 DIAGNOSIS — J181 Lobar pneumonia, unspecified organism: Secondary | ICD-10-CM

## 2019-04-17 DIAGNOSIS — J189 Pneumonia, unspecified organism: Secondary | ICD-10-CM

## 2019-04-17 DIAGNOSIS — R05 Cough: Secondary | ICD-10-CM | POA: Diagnosis not present

## 2019-04-17 MED ORDER — AMOXICILLIN-POT CLAVULANATE 875-125 MG PO TABS: 1.0000 | ORAL_TABLET | Freq: Two times a day (BID) | ORAL | 0 refills | Status: AC

## 2019-04-17 MED ORDER — ONDANSETRON 4 MG PO TBDP: 4.0000 mg | ORAL_TABLET | Freq: Three times a day (TID) | ORAL | 0 refills | Status: DC | PRN

## 2019-04-17 MED ORDER — DOXYCYCLINE HYCLATE 100 MG PO CAPS: 100.0000 mg | ORAL_CAPSULE | Freq: Two times a day (BID) | ORAL | 0 refills | Status: DC

## 2019-04-17 NOTE — ED Provider Notes (Signed)
[facility]    CSN: [id number] Arrival date & time: 04/17/19  0941     History   Chief Complaint Chief Complaint  Patient presents with  . Cough    HPI Pamela Roberson is a 63 y.o. female.   Pamela Roberson presents with complaints of 3 weeks ago sinus headache and ear pain. Last week cough developed. Dry cough. Intermittent chest pain No current chest pain . Right leg muscle pain. Left low back down to calf with pain. Worse last night. Cough flared last night. Non productive cough. No longer smokes. Some nasal drainage. Shortness of breath . Inhaler and nebulizer haven't been helping. Antibiotics a month ago and had improved. Symptoms returned, however. Has appointment with PCP 5/29. Hx of DM, gerd, htn, COPD.    ROS per HPI, negative if not otherwise mentioned.      Past Medical History:  Diagnosis Date  . Arthritis   . Diabetes mellitus without complication ([place])    type II   . Diverticulitis   . Dyspnea    mild   . GERD (gastroesophageal reflux disease)   . History of bronchitis   . Hypertension   . IBS (irritable bowel syndrome)   . Palpitations   . Sleep apnea    mild but no sleep apnea   . TMJ (dislocation of temporomandibular joint)   . 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 Dizzy 12/11/2017  . Pharyngeal dysphagia 12/11/2017  . Post-nasal drip 12/11/2017  . Referred ear pain, bilateral 12/11/2017  . Diabetes mellitus without complication ([place]) 96/75/9163  . Family history of glaucoma 06/29/2017  . Hyperopia of both eyes with astigmatism and presbyopia 06/29/2017  . Keratoconjunctivitis sicca of both eyes not specified as Sjogren's 06/29/2017  . Nuclear sclerotic cataract of both eyes 06/29/2017  . Vitreous floater, bilateral 06/29/2017  . Biceps tendinitis of right shoulder 04/05/2017  . Impingement syndrome of right shoulder 04/05/2017  . Arthritis of right  acromioclavicular joint 04/05/2017  . Partial nontraumatic tear of rotator cuff, right 04/05/2017    Past Surgical History:  Procedure Laterality Date  . ABDOMINAL HYSTERECTOMY    . ESOPHAGOGASTRODUODENOSCOPY ENDOSCOPY     with esophagus being stretched twice per pt,   . EYE SURGERY    . 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 feeding tube placement and removal   2015  . NASAL SEPTUM SURGERY    . spurs removed from esophagus   2015  . TONSILLECTOMY      OB History   No obstetric history on file.      Home Medications    Prior to Admission medications   Medication Sig Start Date End Date Taking? Authorizing Provider  acyclovir (ZOVIRAX) 400 MG tablet Take 400 mg by mouth 2 (two) times daily.    [provider]  albuterol (PROVENTIL HFA;VENTOLIN HFA) 108 (90 BASE) MCG/ACT inhaler Inhale 2 puffs into the lungs every 4 (four) hours as needed for wheezing or shortness of breath.     [provider]  albuterol (PROVENTIL) (2.5 MG/3ML) 0.083% nebulizer solution Take 3 mLs (2.5 mg total) by nebulization every 4 (four) hours as needed for wheezing or shortness of breath. 03/11/19   Raylene Everts, MD  amoxicillin (AMOXIL) 875 MG tablet Take 1 tablet (875 mg total) by mouth 2 (two) times daily. 03/11/19   Raylene Everts, MD  amoxicillin-clavulanate (AUGMENTIN) 875-125 MG tablet Take 1 tablet by mouth every 12 (twelve) hours for 10 days. 04/17/19 04/27/19  Zigmund Gottron, NP  Artificial Tear Ointment (DRY EYES OP) Apply 1-2 drops to eye daily as needed (for dry eyes).    [provider]  atorvastatin (LIPITOR) 10 MG tablet Take 10 mg by mouth daily.    [provider]  dexlansoprazole (DEXILANT) 60 MG capsule Take 60 mg by mouth daily.    [provider]  doxycycline (VIBRAMYCIN) 100 MG capsule Take 1 capsule (100 mg total) by mouth 2 (two) times daily. 04/17/19   Augusto Gamble B, NP  EPINEPHrine 0.3 mg/0.3 mL IJ SOAJ injection Inject 0.3 mg  into the muscle once as needed (allergic reactions).     [provider]  fluticasone (FLONASE) 50 MCG/ACT nasal spray Place 2 sprays into both nostrils daily. 06/08/18   Raylene Everts, MD  Fluticasone-Salmeterol (ADVAIR) 250-50 MCG/DOSE AEPB Inhale 1 puff into the lungs 2 (two) times daily.    [provider]  gabapentin (NEURONTIN) 300 MG capsule Take 300 mg by mouth 3 (three) times daily.    [provider]  loratadine (CLARITIN) 10 MG tablet Take 10 mg by mouth daily.    [provider]  metoprolol tartrate (LOPRESSOR) 25 MG tablet Take 25 mg by mouth 2 (two) times daily.    [provider]  montelukast (SINGULAIR) 10 MG tablet Take 10 mg by mouth every evening.     [provider]  Multiple Vitamin (MULTIVITAMIN WITH MINERALS) TABS tablet Take 1 tablet by mouth daily.    [provider]  naproxen (NAPROSYN) 500 MG tablet  02/02/19   [provider]  ondansetron (ZOFRAN-ODT) 4 MG disintegrating tablet Take 1 tablet (4 mg total) by mouth every 8 (eight) hours as needed for nausea or vomiting. 04/17/19   Zigmund Gottron, NP  Prodigy Twist Top Lancets 28G MISC  02/12/19   [provider]  sitaGLIPtin-metformin (JANUMET) 50-500 MG tablet Take 1 tablet by mouth daily.    [provider]    Family History Family History  Problem Relation Age of Onset  . Diabetes Father     Social History Social History   Tobacco Use  . Smoking status: Former Research scientist (life sciences)  . Smokeless tobacco: Never Used  Substance Use Topics  . Alcohol use: No  . Drug use: No     Allergies   Crab [shellfish allergy]; Erythromycin; Fish allergy; Metoclopramide; Doxycycline; Levaquin [levofloxacin in d5w]; Morphine; Morphine and related; and Sulfa antibiotics   Review of Systems Review of Systems   Physical Exam Triage Vital Signs ED Triage Vitals  Enc Vitals Group     BP 04/17/19 1005 136/82     Pulse Rate 04/17/19 1005 82      Resp 04/17/19 1005 (!) 1     Temp 04/17/19 1005 98.4 F (36.9 C)     Temp Source 04/17/19 1005 Oral     SpO2 04/17/19 1005 99 %     Weight --      Height --      Head Circumference --      Peak Flow --      Pain Score 04/17/19 1002 6     Pain Loc --      Pain Edu? --      Excl. in [place]? --    No data found.  Updated Vital Signs BP 136/82 (BP Location: Right Arm) Comment (BP Location): large cuff  Pulse 82   Temp 98.4 F (36.9 C) (Oral)   Resp 16   SpO2 99%    Physical Exam Constitutional:      General: She is not in acute distress.    Appearance: She is well-developed.  Cardiovascular:     Rate and Rhythm: Normal rate and regular rhythm.     Heart sounds: Normal heart sounds.  Pulmonary:     Effort: Pulmonary effort is normal.     Breath sounds: Decreased breath sounds present.  Skin:    General: Skin is warm and dry.  Neurological:     Mental Status: She is alert and oriented to person, place, and time.      UC Treatments / Results  Labs (all labs ordered are listed, but only abnormal results are displayed) Labs Reviewed - No data to display  EKG None  Radiology Dg Chest 2 View  Result Date: 04/17/2019 CLINICAL DATA:  Cough. EXAM: CHEST - 2 VIEW COMPARISON:  Radiographs of October 06, 2018. FINDINGS: The heart size and mediastinal contours are within normal limits. No pneumothorax or pleural effusion is noted. Left lung is clear. Minimal right basilar subsegmental atelectasis or infiltrate is noted. The visualized skeletal structures are unremarkable. IMPRESSION: Minimal right basilar subsegmental atelectasis or infiltrate. Followup PA and lateral chest X-ray is recommended in 3-4 weeks following trial of antibiotic therapy to ensure resolution and exclude underlying malignancy. Electronically Signed   By: Marijo Conception M.D.   On: 04/17/2019 10:40    Procedures Procedures (including critical care time)  Medications Ordered in UC Medications - No data  to display  Initial Impression / Assessment and Plan / UC Course  I have reviewed the triage vital signs and the nursing notes.  Pertinent labs & imaging results that were available during my care of the patient were reviewed by me and considered in my medical decision making (see chart for details).     Chest xray concerning for pna. Course of antibiotics provided today. Follow up with PCP recommended, especially as it seems this has been recurrent for patient. Return precautions provided. Patient verbalized understanding and agreeable to plan.   Final Clinical Impressions(s) / UC Diagnoses   Final diagnoses:  Community acquired pneumonia of right lower lobe of lung [facility])     Discharge Instructions     Complete course of antibiotics.  Push fluids to ensure adequate hydration and keep secretions thin.  Use of your inhaler and nebulizers as needed.  I have sent zofran to be used as needed for nausea if antibiotics cause this for you.  If any worsening of chest pain, fevers, shortness of breath  please go to the ER.  Please follow up with your primary care provider for recheck in the next 2-3 weeks.     ED Prescriptions    Medication Sig Dispense Auth. Provider   amoxicillin-clavulanate (AUGMENTIN) 875-125 MG tablet Take 1 tablet by mouth every 12 (twelve) hours for 10 days. 20 tablet Augusto Gamble B, NP   doxycycline (VIBRAMYCIN) 100 MG capsule Take 1 capsule (100 mg total) by mouth 2 (two) times daily. 20 capsule Augusto Gamble B, NP   ondansetron (ZOFRAN-ODT) 4 MG disintegrating tablet Take 1 tablet (4 mg total) by mouth every 8 (eight) hours as needed for nausea or vomiting. 12 tablet Zigmund Gottron, NP     Controlled Substance Prescriptions [place] Controlled Substance Registry consulted? Not Applicable   Zigmund Gottron, NP 04/17/19 1513

## 2019-04-17 NOTE — Discharge Instructions (Signed)
Complete course of antibiotics.  Push fluids to ensure adequate hydration and keep secretions thin.  Use of your inhaler and nebulizers as needed.  I have sent zofran to be used as needed for nausea if antibiotics cause this for you.  If any worsening of chest pain, fevers, shortness of breath  please go to the ER.  Please follow up with your primary care provider for recheck in the next 2-3 weeks.

## 2019-04-17 NOTE — ED Triage Notes (Addendum)
Nasal congestion, throat phlegm, and chest congestion started 3 weeks ago.  Increased mucus last week and worsening of cough.  Stuffy nose. Nebulizer has not helped as it usually does  Patient speaks about a lot of room deodorizers at her home and use of bug spray contributing to congestion  Patient also complains of back pain and left leg.

## 2019-04-21 ENCOUNTER — Encounter (HOSPITAL_COMMUNITY): Payer: Self-pay | Admitting: Emergency Medicine

## 2019-04-21 ENCOUNTER — Other Ambulatory Visit: Payer: Self-pay

## 2019-04-21 ENCOUNTER — Ambulatory Visit (HOSPITAL_COMMUNITY)
Admission: EM | Admit: 2019-04-21 | Discharge: 2019-04-21 | Disposition: A | Discharge status: Home / Self Care | Payer: Medicare HMO | Attending: Emergency Medicine | Admitting: Emergency Medicine

## 2019-04-21 DIAGNOSIS — K219 Gastro-esophageal reflux disease without esophagitis: Secondary | ICD-10-CM | POA: Diagnosis not present

## 2019-04-21 MED ORDER — FLUCONAZOLE 200 MG PO TABS: 200.0000 mg | ORAL_TABLET | Freq: Once | ORAL | 0 refills | Status: AC

## 2019-04-21 MED ORDER — ALUM & MAG HYDROXIDE-SIMETH 200-200-20 MG/5ML PO SUSP
30.0000 mL | Freq: Once | ORAL | Status: AC
  Administered 2019-04-21: 17:00:00 30 mL via ORAL

## 2019-04-21 MED ORDER — ONDANSETRON 4 MG PO TBDP: 4.0000 mg | ORAL_TABLET | Freq: Three times a day (TID) | ORAL | 0 refills | Status: DC | PRN

## 2019-04-21 MED ORDER — LIDOCAINE VISCOUS HCL 2 % MT SOLN
15.0000 mL | Freq: Once | OROMUCOSAL | Status: AC
  Administered 2019-04-21: 17:00:00 15 mL via ORAL

## 2019-04-21 MED ORDER — ALUM & MAG HYDROXIDE-SIMETH 200-200-20 MG/5ML PO SUSP
ORAL | Status: AC
  Filled 2019-04-21: qty 30

## 2019-04-21 MED ORDER — ALUMINUM-MAGNESIUM-SIMETHICONE 200-200-20 MG/5ML PO SUSP: 30.0000 mL | Freq: Three times a day (TID) | ORAL | 0 refills | Status: DC

## 2019-04-21 MED ORDER — LIDOCAINE VISCOUS HCL 2 % MT SOLN
OROMUCOSAL | Status: AC
  Filled 2019-04-21: qty 15

## 2019-04-21 NOTE — ED Triage Notes (Addendum)
Patient reports difficulty swallowing food Dry cough Belching frequently, belching helps chest discomfort discomfort is center chest and over to right side of chest  Patient taking multiple antibiotics for pneumonia.

## 2019-04-21 NOTE — Discharge Instructions (Addendum)
You may take up to 8mg  (two tabs) of zofran every 8 hours as needed for nausea or vomiting. This may cause constipation.  Use of maalox up to 4 times a day as needed.  Diflucan at completion of antibiotics.  Please follow up with your primary care provider at completion of antibiotics for recheck.  If any worsening of symptoms please return or go to the ER.

## 2019-04-21 NOTE — ED Provider Notes (Signed)
Pamela Roberson    Pamela Roberson: [id number] Arrival date & time: 04/21/19  1525     History   Chief Complaint Chief Complaint  Patient presents with  . Gastroesophageal Reflux    HPI Pamela Roberson is a 63 y.o. female.   Davey Raymond presents due to flare of her reflux, as well as some nausea, vomiting and diarrhea. Vomited early this morning and diarrhea today. Chest and throat burning. Started after starting doxy and augmentin for pneumonia, and she ate [place] food with hot salsa, as well as banana peppers. She has had similar issues with reflux due to spicy foods in the past. Still with dry cough. Symptoms worse if she lays flat. She takes dexilant. Concerned about yeast vaginitis with antibiotics. No palpitations, headache or dizziness. Hx of DM and IBS.    ROS per HPI, negative if not otherwise mentioned.      Past Medical History:  Diagnosis Date  . Arthritis   . Diabetes mellitus without complication ([place])    type II   . Diverticulitis   . Dyspnea    mild   . GERD (gastroesophageal reflux disease)   . History of bronchitis   . Hypertension   . IBS (irritable bowel syndrome)   . Palpitations   . Sleep apnea    mild but no sleep apnea   . TMJ (dislocation of temporomandibular joint)   . 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 Dizzy 12/11/2017  . Pharyngeal dysphagia 12/11/2017  . Post-nasal drip 12/11/2017  . Referred ear pain, bilateral 12/11/2017  . Diabetes mellitus without complication ([place]) 07/18/4817  . Family history of glaucoma 06/29/2017  . Hyperopia of both eyes with astigmatism and presbyopia 06/29/2017  . Keratoconjunctivitis sicca of both eyes not specified as Sjogren's 06/29/2017  . Nuclear sclerotic cataract of both eyes 06/29/2017  . Vitreous floater, bilateral 06/29/2017  . Biceps tendinitis of right shoulder 04/05/2017  . Impingement syndrome of right shoulder  04/05/2017  . Arthritis of right acromioclavicular joint 04/05/2017  . Partial nontraumatic tear of rotator cuff, right 04/05/2017    Past Surgical History:  Procedure Laterality Date  . ABDOMINAL HYSTERECTOMY    . ESOPHAGOGASTRODUODENOSCOPY ENDOSCOPY     with esophagus being stretched twice per pt,   . EYE SURGERY    . 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 feeding tube placement and removal   2015  . NASAL SEPTUM SURGERY    . spurs removed from esophagus   2015  . TONSILLECTOMY      OB History   No obstetric history on file.      Home Medications    Prior to Admission medications   Medication Sig Start Date End Date Taking? Authorizing Provider  acyclovir (ZOVIRAX) 400 MG tablet Take 400 mg by mouth 2 (two) times daily.    [provider]  albuterol (PROVENTIL HFA;VENTOLIN HFA) 108 (90 BASE) MCG/ACT inhaler Inhale 2 puffs into the lungs every 4 (four) hours as needed for wheezing or shortness of breath.     [provider]  albuterol (PROVENTIL) (2.5 MG/3ML) 0.083% nebulizer solution Take 3 mLs (2.5 mg total) by nebulization every 4 (four) hours as needed for wheezing or shortness of breath. 03/11/19   Raylene Everts, MD  aluminum-magnesium hydroxide-simethicone (MAALOX) 200-200-20 MG/5ML SUSP Take 30 mLs by mouth 4 (four) times daily -  before meals  and at bedtime. 04/21/19   Zigmund Gottron, NP  amoxicillin (AMOXIL) 875 MG tablet Take 1 tablet (875 mg total) by mouth 2 (two) times daily. 03/11/19   Raylene Everts, MD  amoxicillin-clavulanate (AUGMENTIN) 875-125 MG tablet Take 1 tablet by mouth every 12 (twelve) hours for 10 days. 04/17/19 04/27/19  Zigmund Gottron, NP  Artificial Tear Ointment (DRY EYES OP) Apply 1-2 drops to eye daily as needed (for dry eyes).    [provider]  atorvastatin (LIPITOR) 10 MG tablet Take 10 mg by mouth daily.    [provider]  dexlansoprazole (DEXILANT) 60 MG capsule Take 60 mg by mouth daily.     [provider]  doxycycline (VIBRAMYCIN) 100 MG capsule Take 1 capsule (100 mg total) by mouth 2 (two) times daily. 04/17/19   Augusto Gamble B, NP  EPINEPHrine 0.3 mg/0.3 mL IJ SOAJ injection Inject 0.3 mg into the muscle once as needed (allergic reactions).     [provider]  fluconazole (DIFLUCAN) 200 MG tablet Take 1 tablet (200 mg total) by mouth once for 1 dose. At completion of your antibiotics 04/21/19 04/21/19  Zigmund Gottron, NP  fluticasone (FLONASE) 50 MCG/ACT nasal spray Place 2 sprays into both nostrils daily. 06/08/18   Raylene Everts, MD  Fluticasone-Salmeterol (ADVAIR) 250-50 MCG/DOSE AEPB Inhale 1 puff into the lungs 2 (two) times daily.    [provider]  gabapentin (NEURONTIN) 300 MG capsule Take 300 mg by mouth 3 (three) times daily.    [provider]  loratadine (CLARITIN) 10 MG tablet Take 10 mg by mouth daily.    [provider]  metoprolol tartrate (LOPRESSOR) 25 MG tablet Take 25 mg by mouth 2 (two) times daily.    [provider]  montelukast (SINGULAIR) 10 MG tablet Take 10 mg by mouth every evening.     [provider]  Multiple Vitamin (MULTIVITAMIN WITH MINERALS) TABS tablet Take 1 tablet by mouth daily.    [provider]  naproxen (NAPROSYN) 500 MG tablet  02/02/19   [provider]  ondansetron (ZOFRAN-ODT) 4 MG disintegrating tablet Take 1 tablet (4 mg total) by mouth every 8 (eight) hours as needed for nausea or vomiting. 04/21/19   Zigmund Gottron, NP  Prodigy Twist Top Lancets 28G MISC  02/12/19   [provider]  sitaGLIPtin-metformin (JANUMET) 50-500 MG tablet Take 1 tablet by mouth daily.    [provider]    Family History Family History  Problem Relation Age of Onset  . Diabetes Father     Social History Social History   Tobacco Use  . Smoking status: Former Research scientist (life sciences)  . Smokeless tobacco: Never Used  Substance Use Topics  . Alcohol use: No  .  Drug use: No     Allergies   Crab [shellfish allergy]; Erythromycin; Fish allergy; Metoclopramide; Doxycycline; Levaquin [levofloxacin in d5w]; Morphine; Morphine and related; and Sulfa antibiotics   Review of Systems Review of Systems   Physical Exam Triage Vital Signs ED Triage Vitals  Enc Vitals Group     BP 04/21/19 1543 127/64     Pulse Rate 04/21/19 1543 91     Resp 04/21/19 1543 12     Temp 04/21/19 1543 98.9 F (37.2 C)     Temp src --      SpO2 04/21/19 1543 100 %     Weight --      Height --      Head Circumference --  Peak Flow --      Pain Score 04/21/19 1554 8     Pain Loc --      Pain Edu? --      Excl. in [place]? --    No data found.  Updated Vital Signs BP 127/64 (BP Location: Left Arm)   Pulse 91   Temp 98.9 F (37.2 C)   Resp 12   SpO2 100%   Visual Acuity Right Eye Distance:   Left Eye Distance:   Bilateral Distance:    Right Eye Near:   Left Eye Near:    Bilateral Near:     Physical Exam Constitutional:      General: She is not in acute distress.    Appearance: She is well-developed.  Cardiovascular:     Rate and Rhythm: Normal rate and regular rhythm.     Heart sounds: Normal heart sounds.  Pulmonary:     Effort: Pulmonary effort is normal.     Breath sounds: Normal breath sounds.  Abdominal:     General: Abdomen is flat. Bowel sounds are increased.     Palpations: Abdomen is soft.     Tenderness: There is abdominal tenderness in the epigastric area.  Skin:    General: Skin is warm and dry.  Neurological:     Mental Status: She is alert and oriented to person, place, and time.    EKG: NSR . Previous EKG was available for review. No stwave changes as interpreted by me.    UC Treatments / Results  Labs (all labs ordered are listed, but only abnormal results are displayed) Labs Reviewed - No data to display  EKG None  Radiology No results found.  Procedures Procedures (including critical care time)  Medications  Ordered in UC Medications  alum & mag hydroxide-simeth (MAALOX/MYLANTA) 200-200-20 MG/5ML suspension 30 mL (30 mLs Oral Given 04/21/19 1634)    And  lidocaine (XYLOCAINE) 2 % viscous mouth solution 15 mL (15 mLs Oral Given 04/21/19 1634)    Initial Impression / Assessment and Plan / UC Course  I have reviewed the triage vital signs and the nursing notes.  Pertinent labs & imaging results that were available during my care of the patient were reviewed by me and considered in my medical decision making (see chart for details).     ekg reassuring. Antibiotics as well as dietary trigger of gerd. Recent antibiotics for pna, and multiple allergies limit available antibiotics for pna treatment. Patient agreeable to continuing, requests gi cocktail here. Discussed treatment options for symptoms. Return precautions provided. Patient verbalized understanding and agreeable to plan.   Final Clinical Impressions(s) / UC Diagnoses   Final diagnoses:  Gastric reflux     Discharge Instructions     You may take up to 8mg  (two tabs) of zofran every 8 hours as needed for nausea or vomiting. This may cause constipation.  Use of maalox up to 4 times a day as needed.  Diflucan at completion of antibiotics.  Please follow up with your primary care provider at completion of antibiotics for recheck.  If any worsening of symptoms please return or go to the ER.    ED Prescriptions    Medication Sig Dispense Auth. Provider   ondansetron (ZOFRAN-ODT) 4 MG disintegrating tablet Take 1 tablet (4 mg total) by mouth every 8 (eight) hours as needed for nausea or vomiting. 12 tablet Augusto Gamble B, NP   aluminum-magnesium hydroxide-simethicone (MAALOX) 161-096-04 MG/5ML SUSP Take 30 mLs by mouth 4 (four)  times daily -  before meals and at bedtime. 710 mL Augusto Gamble B, NP   fluconazole (DIFLUCAN) 200 MG tablet Take 1 tablet (200 mg total) by mouth once for 1 dose. At completion of your antibiotics 1 tablet  Zigmund Gottron, NP     Controlled Substance Prescriptions [place] Controlled Substance Registry consulted? Not Applicable   Zigmund Gottron, NP 04/21/19 1645

## 2019-04-24 ENCOUNTER — Other Ambulatory Visit: Payer: Self-pay

## 2019-04-24 ENCOUNTER — Emergency Department (HOSPITAL_COMMUNITY): Payer: Medicare HMO

## 2019-04-24 ENCOUNTER — Emergency Department (HOSPITAL_COMMUNITY)
Admission: EM | Admit: 2019-04-24 | Discharge: 2019-04-24 | Disposition: A | Discharge status: Home / Self Care | Payer: Medicare HMO | Attending: Emergency Medicine | Admitting: Emergency Medicine

## 2019-04-24 DIAGNOSIS — Z87891 Personal history of nicotine dependence: Secondary | ICD-10-CM | POA: Insufficient documentation

## 2019-04-24 DIAGNOSIS — E119 Type 2 diabetes mellitus without complications: Secondary | ICD-10-CM | POA: Diagnosis not present

## 2019-04-24 DIAGNOSIS — Z7984 Long term (current) use of oral hypoglycemic drugs: Secondary | ICD-10-CM | POA: Insufficient documentation

## 2019-04-24 DIAGNOSIS — J385 Laryngeal spasm: Secondary | ICD-10-CM | POA: Insufficient documentation

## 2019-04-24 DIAGNOSIS — K219 Gastro-esophageal reflux disease without esophagitis: Secondary | ICD-10-CM | POA: Insufficient documentation

## 2019-04-24 DIAGNOSIS — I10 Essential (primary) hypertension: Secondary | ICD-10-CM | POA: Insufficient documentation

## 2019-04-24 DIAGNOSIS — R079 Chest pain, unspecified: Secondary | ICD-10-CM | POA: Diagnosis present

## 2019-04-24 DIAGNOSIS — M5432 Sciatica, left side: Secondary | ICD-10-CM | POA: Diagnosis not present

## 2019-04-24 DIAGNOSIS — Z79899 Other long term (current) drug therapy: Secondary | ICD-10-CM | POA: Insufficient documentation

## 2019-04-24 LAB — BASIC METABOLIC PANEL
Anion gap: 10 (ref 5–15)
BUN: 11 mg/dL (ref 8–23)
CO2: 26 mmol/L (ref 22–32)
Calcium: 9.4 mg/dL (ref 8.9–10.3)
Chloride: 105 mmol/L (ref 98–111)
Creatinine, Ser: 0.8 mg/dL (ref 0.44–1.00)
GFR calc Af Amer: >60 mL/min (ref >60)
GFR calc non Af Amer: >60 mL/min (ref >60)
Glucose, Bld: 108 mg/dL — ABNORMAL HIGH (ref 70–99)
Potassium: 4.2 mmol/L (ref 3.5–5.1)
Sodium: 141 mmol/L (ref 135–145)

## 2019-04-24 LAB — CBC
HCT: 47.7 % — ABNORMAL HIGH (ref 36.0–46.0)
Hemoglobin: 14.8 g/dL (ref 12.0–15.0)
MCH: 29.4 pg (ref 26.0–34.0)
MCHC: 31 g/dL (ref 30.0–36.0)
MCV: 94.6 fL (ref 80.0–100.0)
Platelets: 175 10*3/uL (ref 150–400)
RBC: 5.04 MIL/uL (ref 3.87–5.11)
RDW: 12.8 % (ref 11.5–15.5)
WBC: 5.6 10*3/uL (ref 4.0–10.5)
nRBC: 0 % (ref 0.0–0.2)

## 2019-04-24 LAB — TROPONIN I: Troponin I: <0.03 ng/mL (ref <0.03)

## 2019-04-24 MED ORDER — METHYLPREDNISOLONE 4 MG PO TBPK: ORAL_TABLET | ORAL | 0 refills | Status: DC

## 2019-04-24 MED ORDER — GUAIFENESIN ER 600 MG PO TB12
600.0000 mg | ORAL_TABLET | Freq: Once | ORAL | Status: AC
  Administered 2019-04-24: 600 mg via ORAL
  Filled 2019-04-24: qty 1

## 2019-04-24 MED ORDER — FAMOTIDINE 20 MG PO TABS
20.0000 mg | ORAL_TABLET | Freq: Once | ORAL | Status: AC
  Administered 2019-04-24: 20 mg via ORAL
  Filled 2019-04-24: qty 1

## 2019-04-24 MED ORDER — SODIUM CHLORIDE 0.9% FLUSH: 3.0000 mL | Freq: Once | INTRAVENOUS | Status: DC

## 2019-04-24 MED ORDER — ACETAMINOPHEN-CODEINE #3 300-30 MG PO TABS: 1.0000 | ORAL_TABLET | Freq: Four times a day (QID) | ORAL | 0 refills | Status: DC | PRN

## 2019-04-24 MED ORDER — ESOMEPRAZOLE MAGNESIUM 40 MG PO CPDR: 40.0000 mg | DELAYED_RELEASE_CAPSULE | Freq: Every day | ORAL | 0 refills | Status: DC

## 2019-04-24 MED ORDER — ACETAMINOPHEN-CODEINE #3 300-30 MG PO TABS
2.0000 | ORAL_TABLET | Freq: Once | ORAL | Status: AC
  Administered 2019-04-24: 2 via ORAL
  Filled 2019-04-24: qty 2

## 2019-04-24 MED ORDER — GUAIFENESIN ER 600 MG PO TB12: 600.0000 mg | ORAL_TABLET | Freq: Two times a day (BID) | ORAL | 1 refills | Status: DC

## 2019-04-24 NOTE — ED Triage Notes (Signed)
Pt to the ED with c/o left chest pain onset earlier today also c/o feeling like she could not breath.  Pt was dx with pneumonia on 5/17 and is currently taking 2 kinds of antibiotics

## 2019-04-24 NOTE — Discharge Instructions (Addendum)
1.  Start taking Nexium for possible reflux contributing to spasms in your throat.  You should also start taking Mucinex to thin mucus secretions. 2.  For your sciatica, start to the Medrol Dosepak (steroid) and Tylenol with codeine as needed.  The Medrol Dosepak can elevate your blood sugar, be sure to monitor your blood sugar closely, twice a day for the next 2 weeks. 3.  See your doctor soon as possible for further evaluation.

## 2019-04-24 NOTE — ED Provider Notes (Signed)
[facility] EMERGENCY DEPARTMENT Provider Note   CSN: [id number] Arrival date & time: 04/24/19  1738    History   Chief Complaint Chief Complaint  Patient presents with  . Chest Pain    HPI Jerelene Salaam is a 63 y.o. female.     HPI Patient reports that she had a diagnosis of pneumonia 2 times since February.  She reports that she has gotten a lot better but she still gets a dry cough.  Reports a problem that really bothers her as she starts to feel like something is stuck or clogging her throat and that her wind cuts off.  She feels like there is something "hung up" in her throat just closes.  She has not been on any Mucinex.  She does report that she is recently told the symptoms might be reflux-like.  She has not had any fever or other shortness of breath.  She reports the other problem she has is that she is got an aching pain like a toothache in her left leg.  She reports it starts right in the center of her buttock and then just travels down the back of her leg almost to her foot.  He denies she has been weak or numb but reports that sometimes is so painful that it makes her knee give out and buckle.  She reports she is really not been any pain medicine for it.  She has tried naproxen without any relief. Past Medical History:  Diagnosis Date  . Arthritis   . Diabetes mellitus without complication ([place])    type II   . Diverticulitis   . Dyspnea    mild   . GERD (gastroesophageal reflux disease)   . History of bronchitis   . Hypertension   . IBS (irritable bowel syndrome)   . Palpitations   . Sleep apnea    mild but no sleep apnea   . TMJ (dislocation of temporomandibular joint)   . 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 Dizzy 12/11/2017  . Pharyngeal dysphagia 12/11/2017  . Post-nasal drip 12/11/2017  . Referred ear pain, bilateral 12/11/2017  . Diabetes mellitus without complication  ([place]) 62/13/0865  . Family history of glaucoma 06/29/2017  . Hyperopia of both eyes with astigmatism and presbyopia 06/29/2017  . Keratoconjunctivitis sicca of both eyes not specified as Sjogren's 06/29/2017  . Nuclear sclerotic cataract of both eyes 06/29/2017  . Vitreous floater, bilateral 06/29/2017  . Biceps tendinitis of right shoulder 04/05/2017  . Impingement syndrome of right shoulder 04/05/2017  . Arthritis of right acromioclavicular joint 04/05/2017  . Partial nontraumatic tear of rotator cuff, right 04/05/2017    Past Surgical History:  Procedure Laterality Date  . ABDOMINAL HYSTERECTOMY    . ESOPHAGOGASTRODUODENOSCOPY ENDOSCOPY     with esophagus being stretched twice per pt,   . EYE SURGERY    . 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 feeding tube placement and removal   2015  . NASAL SEPTUM SURGERY    . spurs removed from esophagus   2015  . TONSILLECTOMY       OB History   No obstetric history on file.      Home Medications    Prior to Admission medications   Medication Sig Start Date End Date Taking? Authorizing Provider  acyclovir (ZOVIRAX) 400 MG tablet Take 400 mg by mouth 2 (  two) times daily.    [provider]  albuterol (PROVENTIL HFA;VENTOLIN HFA) 108 (90 BASE) MCG/ACT inhaler Inhale 2 puffs into the lungs every 4 (four) hours as needed for wheezing or shortness of breath.     [provider]  albuterol (PROVENTIL) (2.5 MG/3ML) 0.083% nebulizer solution Take 3 mLs (2.5 mg total) by nebulization every 4 (four) hours as needed for wheezing or shortness of breath. 03/11/19   Raylene Everts, MD  aluminum-magnesium hydroxide-simethicone (MAALOX) 200-200-20 MG/5ML SUSP Take 30 mLs by mouth 4 (four) times daily -  before meals and at bedtime. 04/21/19   Zigmund Gottron, NP  amoxicillin (AMOXIL) 875 MG tablet Take 1 tablet (875 mg total) by mouth 2 (two) times daily. 03/11/19   Raylene Everts, MD  amoxicillin-clavulanate (AUGMENTIN)  875-125 MG tablet Take 1 tablet by mouth every 12 (twelve) hours for 10 days. 04/17/19 04/27/19  Zigmund Gottron, NP  Artificial Tear Ointment (DRY EYES OP) Apply 1-2 drops to eye daily as needed (for dry eyes).    [provider]  atorvastatin (LIPITOR) 10 MG tablet Take 10 mg by mouth daily.    [provider]  dexlansoprazole (DEXILANT) 60 MG capsule Take 60 mg by mouth daily.    [provider]  doxycycline (VIBRAMYCIN) 100 MG capsule Take 1 capsule (100 mg total) by mouth 2 (two) times daily. 04/17/19   Augusto Gamble B, NP  EPINEPHrine 0.3 mg/0.3 mL IJ SOAJ injection Inject 0.3 mg into the muscle once as needed (allergic reactions).     [provider]  fluticasone (FLONASE) 50 MCG/ACT nasal spray Place 2 sprays into both nostrils daily. 06/08/18   Raylene Everts, MD  Fluticasone-Salmeterol (ADVAIR) 250-50 MCG/DOSE AEPB Inhale 1 puff into the lungs 2 (two) times daily.    [provider]  gabapentin (NEURONTIN) 300 MG capsule Take 300 mg by mouth 3 (three) times daily.    [provider]  loratadine (CLARITIN) 10 MG tablet Take 10 mg by mouth daily.    [provider]  metoprolol tartrate (LOPRESSOR) 25 MG tablet Take 25 mg by mouth 2 (two) times daily.    [provider]  montelukast (SINGULAIR) 10 MG tablet Take 10 mg by mouth every evening.     [provider]  Multiple Vitamin (MULTIVITAMIN WITH MINERALS) TABS tablet Take 1 tablet by mouth daily.    [provider]  naproxen (NAPROSYN) 500 MG tablet  02/02/19   [provider]  ondansetron (ZOFRAN-ODT) 4 MG disintegrating tablet Take 1 tablet (4 mg total) by mouth every 8 (eight) hours as needed for nausea or vomiting. 04/21/19   Zigmund Gottron, NP  Prodigy Twist Top Lancets 28G MISC  02/12/19   [provider]  sitaGLIPtin-metformin (JANUMET) 50-500 MG tablet Take 1 tablet by mouth daily.    [provider]    Family  History Family History  Problem Relation Age of Onset  . Diabetes Father     Social History Social History   Tobacco Use  . Smoking status: Former Research scientist (life sciences)  . Smokeless tobacco: Never Used  Substance Use Topics  . Alcohol use: No  . Drug use: No     Allergies   Crab [shellfish allergy]; Erythromycin; Fish allergy; Metoclopramide; Doxycycline; Levaquin [levofloxacin in d5w]; Morphine; Morphine and related; and Sulfa antibiotics   Review of Systems Review of Systems 10 Systems reviewed and are negative for acute change except as noted in the HPI.   Physical  Exam Updated Vital Signs BP 124/66 (BP Location: Right Arm)   Pulse 68   Temp 97.9 F (36.6 C) (Oral)   Resp 16   Ht 5\' 7"  (1.702 m)   Wt 93.9 kg   SpO2 100%   BMI 32.42 kg/m   Physical Exam Constitutional:      Appearance: Normal appearance.  HENT:     Head: Normocephalic and atraumatic.     Nose: Nose normal.     Mouth/Throat:     Mouth: Mucous membranes are moist.     Pharynx: Oropharynx is clear.     Comments: Posterior oropharynx is widely patent.  No postnasal drip visible mucous membranes are pink and moist Eyes:     Extraocular Movements: Extraocular movements intact.  Neck:     Musculoskeletal: Neck supple.     Comments: Neck is supple.  No stridor.  She does however report that when I press on the trachea with my stethoscope, that reproduces that feeling of choking and makes her want to cough. Cardiovascular:     Rate and Rhythm: Normal rate and regular rhythm.     Pulses: Normal pulses.     Heart sounds: Normal heart sounds.  Pulmonary:     Effort: Pulmonary effort is normal.     Breath sounds: Normal breath sounds.  Abdominal:     General: There is no distension.     Palpations: Abdomen is soft.     Tenderness: There is no abdominal tenderness. There is no guarding.  Musculoskeletal: Normal range of motion.        General: No swelling or tenderness.     Right lower leg: No edema.      Left lower leg: No edema.     Comments: Patient reports transitioning from semi-recumbent to sitting straight upright exacerbates her lower back pain.  She reports it starts right in her buttock she indicates SI region and then she feels that shoot down her leg.  No weakness of the legs.  Legs are symmetric.  Patient does have arthritic appearing knees but no effusions.  Calves are soft and nontender.  Skin:    General: Skin is warm and dry.  Neurological:     General: No focal deficit present.     Mental Status: She is alert and oriented to person, place, and time.     Coordination: Coordination normal.  Psychiatric:        Mood and Affect: Mood normal.      ED Treatments / Results  Labs (all labs ordered are listed, but only abnormal results are displayed) Labs Reviewed  CBC - Abnormal; Notable for the following components:      Result Value   HCT 47.7 (*)    All other components within normal limits  BASIC METABOLIC PANEL - Abnormal; Notable for the following components:   Glucose, Bld 108 (*)    All other components within normal limits  TROPONIN I    EKG EKG Interpretation  Date/Time:  Wednesday Apr 24 2019 17:56:04 EDT Ventricular Rate:  82 PR Interval:  168 QRS Duration: 82 QT Interval:  382 QTC Calculation: 446 R Axis:   -28 Text Interpretation:  Normal sinus rhythm Cannot rule out Anterior infarct , age undetermined Abnormal ECG no STEMI, no acute ischemic appearance. similar to previous Confirmed by Charlesetta Shanks [phone]) on 04/24/2019 9:14:09 PM   Radiology No results found.  Procedures Procedures (including critical care time)  Medications Ordered in ED Medications  sodium chloride flush (  NS) 0.9 % injection 3 mL (3 mLs Intravenous Not Given 04/24/19 2059)  famotidine (PEPCID) tablet 20 mg (has no administration in time range)  guaiFENesin (MUCINEX) 12 hr tablet 600 mg (has no administration in time range)     Initial Impression / Assessment and Plan  / ED Course  I have reviewed the triage vital signs and the nursing notes.  Pertinent labs & imaging results that were available during my care of the patient were reviewed by me and considered in my medical decision making (see chart for details).       First problem sounds like patient is getting some laryngospasm.  This may be from mucus production or possibly reflux.  She continues to have dry cough.  Will place her on Mucinex.  Patient reports that the Dexilant does not seem to be working for her.  Will opt to try Protonix. Patient's other complaint is consistent with sciatica.  She clearly endorses pain from the SI region in her buttocks and then a radiation straight down the back of her leg.  No signs of DVT or neurovascular dysfunction.  Patient reports she has tolerated Tylenol 3 for pain control in the past.  Will attempt that.  Patient blood sugars are well controlled.  I do feel she can try a Medrol Dosepak.  Patient counseled on close follow-up with PCP for further evaluation if needed  Final Clinical Impressions(s) / ED Diagnoses   Final diagnoses:  Laryngospasm  Gastroesophageal reflux disease, esophagitis presence not specified  Sciatica of left side    ED Discharge Orders    None       Charlesetta Shanks, MD 04/24/19 2154

## 2019-04-24 NOTE — ED Notes (Signed)
Patient verbalizes understanding of discharge instructions. Opportunity for questioning and answers were provided. Armband removed by staff, pt discharged from ED ambulatory to home.  

## 2019-05-11 ENCOUNTER — Other Ambulatory Visit: Payer: Self-pay

## 2019-05-11 ENCOUNTER — Emergency Department (HOSPITAL_COMMUNITY)
Admission: EM | Admit: 2019-05-11 | Discharge: 2019-05-11 | Disposition: A | Discharge status: Home / Self Care | Payer: Medicare HMO | Attending: Emergency Medicine | Admitting: Emergency Medicine

## 2019-05-11 ENCOUNTER — Emergency Department (HOSPITAL_COMMUNITY): Payer: Medicare HMO

## 2019-05-11 ENCOUNTER — Encounter (HOSPITAL_COMMUNITY): Payer: Self-pay

## 2019-05-11 DIAGNOSIS — Z7984 Long term (current) use of oral hypoglycemic drugs: Secondary | ICD-10-CM | POA: Diagnosis not present

## 2019-05-11 DIAGNOSIS — R110 Nausea: Secondary | ICD-10-CM | POA: Diagnosis not present

## 2019-05-11 DIAGNOSIS — K625 Hemorrhage of anus and rectum: Secondary | ICD-10-CM | POA: Diagnosis not present

## 2019-05-11 DIAGNOSIS — Z79899 Other long term (current) drug therapy: Secondary | ICD-10-CM | POA: Diagnosis not present

## 2019-05-11 DIAGNOSIS — E119 Type 2 diabetes mellitus without complications: Secondary | ICD-10-CM | POA: Diagnosis not present

## 2019-05-11 DIAGNOSIS — Z87891 Personal history of nicotine dependence: Secondary | ICD-10-CM | POA: Insufficient documentation

## 2019-05-11 DIAGNOSIS — I10 Essential (primary) hypertension: Secondary | ICD-10-CM | POA: Insufficient documentation

## 2019-05-11 DIAGNOSIS — R1013 Epigastric pain: Secondary | ICD-10-CM | POA: Diagnosis present

## 2019-05-11 DIAGNOSIS — K209 Esophagitis, unspecified without bleeding: Secondary | ICD-10-CM

## 2019-05-11 LAB — URINALYSIS, ROUTINE W REFLEX MICROSCOPIC
Bilirubin Urine: NEGATIVE
Glucose, UA: NEGATIVE mg/dL
Hgb urine dipstick: NEGATIVE
Ketones, ur: NEGATIVE mg/dL
Leukocytes,Ua: NEGATIVE
Nitrite: NEGATIVE
Protein, ur: NEGATIVE mg/dL
Specific Gravity, Urine: 1.002 — ABNORMAL LOW (ref 1.005–1.030)
pH: 7 (ref 5.0–8.0)

## 2019-05-11 LAB — ABO/RH: ABO/RH(D): B POS

## 2019-05-11 LAB — TYPE AND SCREEN
ABO/RH(D): B POS
Antibody Screen: NEGATIVE

## 2019-05-11 LAB — COMPREHENSIVE METABOLIC PANEL
ALT: 23 U/L (ref 0–44)
AST: 25 U/L (ref 15–41)
Albumin: 3.9 g/dL (ref 3.5–5.0)
Alkaline Phosphatase: 81 U/L (ref 38–126)
Anion gap: 10 (ref 5–15)
BUN: 8 mg/dL (ref 8–23)
CO2: 25 mmol/L (ref 22–32)
Calcium: 9.5 mg/dL (ref 8.9–10.3)
Chloride: 106 mmol/L (ref 98–111)
Creatinine, Ser: 0.85 mg/dL (ref 0.44–1.00)
GFR calc Af Amer: >60 mL/min (ref >60)
GFR calc non Af Amer: >60 mL/min (ref >60)
Glucose, Bld: 109 mg/dL — ABNORMAL HIGH (ref 70–99)
Potassium: 4.4 mmol/L (ref 3.5–5.1)
Sodium: 141 mmol/L (ref 135–145)
Total Bilirubin: 0.9 mg/dL (ref 0.3–1.2)
Total Protein: 6.3 g/dL — ABNORMAL LOW (ref 6.5–8.1)

## 2019-05-11 LAB — POC OCCULT BLOOD, ED: Fecal Occult Bld: NEGATIVE

## 2019-05-11 LAB — CBC
HCT: 43.7 % (ref 36.0–46.0)
Hemoglobin: 14.4 g/dL (ref 12.0–15.0)
MCH: 29.3 pg (ref 26.0–34.0)
MCHC: 33 g/dL (ref 30.0–36.0)
MCV: 88.8 fL (ref 80.0–100.0)
Platelets: 245 10*3/uL (ref 150–400)
RBC: 4.92 MIL/uL (ref 3.87–5.11)
RDW: 12.9 % (ref 11.5–15.5)
WBC: 5 10*3/uL (ref 4.0–10.5)
nRBC: 0 % (ref 0.0–0.2)

## 2019-05-11 LAB — LIPASE, BLOOD: Lipase: 27 U/L (ref 11–51)

## 2019-05-11 MED ORDER — FAMOTIDINE IN NACL 20-0.9 MG/50ML-% IV SOLN
20.0000 mg | Freq: Once | INTRAVENOUS | Status: AC
  Administered 2019-05-11: 20 mg via INTRAVENOUS
  Filled 2019-05-11: qty 50

## 2019-05-11 MED ORDER — DICYCLOMINE HCL 10 MG PO CAPS
10.0000 mg | ORAL_CAPSULE | Freq: Once | ORAL | Status: AC
  Administered 2019-05-11: 10 mg via ORAL
  Filled 2019-05-11: qty 1

## 2019-05-11 MED ORDER — PANTOPRAZOLE SODIUM 20 MG PO TBEC: 40.0000 mg | DELAYED_RELEASE_TABLET | Freq: Every day | ORAL | 0 refills | Status: DC

## 2019-05-11 MED ORDER — ONDANSETRON HCL 4 MG/2ML IJ SOLN
4.0000 mg | Freq: Once | INTRAMUSCULAR | Status: AC
  Administered 2019-05-11: 4 mg via INTRAVENOUS
  Filled 2019-05-11: qty 2

## 2019-05-11 MED ORDER — ALUM & MAG HYDROXIDE-SIMETH 200-200-20 MG/5ML PO SUSP
30.0000 mL | Freq: Once | ORAL | Status: AC
  Administered 2019-05-11: 30 mL via ORAL
  Filled 2019-05-11: qty 30

## 2019-05-11 MED ORDER — IOHEXOL 300 MG/ML  SOLN
100.0000 mL | Freq: Once | INTRAMUSCULAR | Status: AC | PRN
  Administered 2019-05-11: 100 mL via INTRAVENOUS

## 2019-05-11 MED ORDER — PANTOPRAZOLE SODIUM 40 MG IV SOLR
40.0000 mg | Freq: Once | INTRAVENOUS | Status: AC
  Administered 2019-05-11: 40 mg via INTRAVENOUS
  Filled 2019-05-11: qty 40

## 2019-05-11 MED ORDER — SODIUM CHLORIDE 0.9 % IV BOLUS
1000.0000 mL | Freq: Once | INTRAVENOUS | Status: AC
  Administered 2019-05-11: 1000 mL via INTRAVENOUS

## 2019-05-11 MED ORDER — SUCRALFATE 1 G PO TABS
1.0000 g | ORAL_TABLET | Freq: Once | ORAL | Status: AC
  Administered 2019-05-11: 1 g via ORAL
  Filled 2019-05-11: qty 1

## 2019-05-11 MED ORDER — SUCRALFATE 1 G PO TABS: 1.0000 g | ORAL_TABLET | Freq: Three times a day (TID) | ORAL | 0 refills | Status: DC

## 2019-05-11 MED ORDER — SODIUM CHLORIDE 0.9% FLUSH
3.0000 mL | Freq: Once | INTRAVENOUS | Status: AC
  Administered 2019-05-11: 3 mL via INTRAVENOUS

## 2019-05-11 MED ORDER — FAMOTIDINE IN NACL 20-0.9 MG/50ML-% IV SOLN: 20.0000 mg | Freq: Once | INTRAVENOUS | Status: DC

## 2019-05-11 MED ORDER — SODIUM CHLORIDE 0.9 % IV SOLN
INTRAVENOUS | Status: DC | PRN
  Administered 2019-05-11: 250 mL via INTRAVENOUS

## 2019-05-11 NOTE — Discharge Instructions (Signed)
You were given a prescription for carafate to help with your symptoms. You were given dietary information that will be helpful for your symptoms.   You were also given a referral to a GI doctor to follow up with in regards to your symptoms.   Please return to the ER sooner if you have any new or worsening symptoms, or if you have any of the following symptoms:  Abdominal pain that does not go away.  You have a fever.  You keep throwing up (vomiting) or you are not able to eat/drink The pain is felt only in portions of the abdomen. Pain in the right side could possibly be appendicitis. In an adult, pain in the left lower portion of the abdomen could be colitis or diverticulitis.  You pass bloody or black tarry stools.  There is bright red blood in the stool.  The constipation stays for more than 4 days.  There is belly (abdominal) or rectal pain.  You do not seem to be getting better.  You have any questions or concerns.

## 2019-05-11 NOTE — ED Notes (Signed)
Pt states she is unable to swallow pill (bentyl) and she "feels like it is sitting in the back of her throat." Pt given water and applesauce in attempt to help swallow pill.

## 2019-05-11 NOTE — ED Provider Notes (Signed)
[facility] EMERGENCY DEPARTMENT Provider Note   CSN: [id number] Arrival date & time: 05/11/19  0017    History   Chief Complaint Chief Complaint  Patient presents with   Abdominal Pain   Rectal Bleeding    HPI Pamela Roberson is a 63 y.o. female.     HPI   Pt is a 63 y/o female with a h/o T2DM, diverticulitis, GERD, HTN, IBS, palpitations, s/p esophageal dilation x2 (per patient) who presents to the ED for eval of throat spasms, abd pain and rectal bleeding.  She c/o throat spasms and states food feels like its getting caught in her throat. States this leads to a dry cough. States she has been seen in the ED and UC several times last month for similar complaints and was started on protonix. She has compliant with this but feels like it has not improved her symptoms. States symptoms occur intermittently and do not occur every day. States she also saw her pcp about sxs and is currently being referred to GI but she has not heard back yet about an appointment.   She c/o pain to the epigastrium that is constant. Pain is worse with palpation and movement. Pain does not change when she eats. Pain rated 8/10. States it feels like a menstrual cramp.  States she has had nausea and reflux but denies vomiting. Reports chronic constipation and is on laxatives she this. last BM today. No diarrhea.  She also c/o bleeding from her rectum that she noted 4 days ago. She noted one episode of bright red blood on the toilet tissue and in the commode. She has had no further episodes since then. Prior to this she had black stools, but this has since resolved.  States she uses NSAIDs about once per week. Denies ETOH use.   EGD from 04/2014 showed duodenitis and small hiatal hernia. EGD from 08/2014 at [facility] showed duodenitis. She reports h/o PUD.   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     Patient Active Problem List   Diagnosis Date Noted   Dizzy 12/11/2017   Pharyngeal dysphagia 12/11/2017   Post-nasal drip 12/11/2017   Referred ear pain, bilateral 12/11/2017   Diabetes mellitus without complication ([place]) 49/44/9675   Family history of glaucoma 06/29/2017   Hyperopia of both eyes with astigmatism and presbyopia 06/29/2017   Keratoconjunctivitis sicca of both eyes not specified as Sjogren's 06/29/2017   Nuclear sclerotic cataract of both eyes 06/29/2017   Vitreous floater, bilateral 06/29/2017   Biceps tendinitis of right shoulder 04/05/2017   Impingement syndrome of right shoulder 04/05/2017   Arthritis of right acromioclavicular joint 04/05/2017   Partial nontraumatic tear of rotator cuff, right 04/05/2017    Past Surgical History:  Procedure Laterality Date   ABDOMINAL HYSTERECTOMY     ESOPHAGOGASTRODUODENOSCOPY ENDOSCOPY     with esophagus being stretched twice per pt,    EYE SURGERY     fatty 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  feeding tube placement and removal   2015   NASAL SEPTUM SURGERY     spurs removed from esophagus   2015   TONSILLECTOMY       OB History   No obstetric history on file.      Home Medications    Prior to Admission medications   Medication Sig Start Date End Date Taking? Authorizing Provider  acetaminophen-codeine (TYLENOL #3) 300-30 MG tablet Take 1-2 tablets by mouth every 6 (six) hours as needed for moderate pain. 04/24/19  Yes Charlesetta Shanks, MD  acyclovir (ZOVIRAX) 400 MG tablet Take 400 mg by mouth 2 (two) times daily.   Yes [provider]  albuterol (PROVENTIL HFA;VENTOLIN  HFA) 108 (90 BASE) MCG/ACT inhaler Inhale 2 puffs into the lungs every 4 (four) hours as needed for wheezing or shortness of breath.    Yes [provider]  albuterol (PROVENTIL) (2.5 MG/3ML) 0.083% nebulizer solution Take 3 mLs (2.5 mg total) by nebulization every 4 (four) hours as needed for wheezing or shortness of breath. 03/11/19  Yes Raylene Everts, MD  aluminum-magnesium hydroxide-simethicone (MAALOX) 200-200-20 MG/5ML SUSP Take 30 mLs by mouth 4 (four) times daily -  before meals and at bedtime. Patient taking differently: Take 30 mLs by mouth 3 (three) times daily as needed (acid reflux).  04/21/19  Yes Burky, Malachy Moan, NP  Artificial Tear Ointment (DRY EYES OP) Apply 1-2 drops to eye daily as needed (for dry eyes).   Yes [provider]  atorvastatin (LIPITOR) 10 MG tablet Take 10 mg by mouth daily.   Yes [provider]  EPINEPHrine 0.3 mg/0.3 mL IJ SOAJ injection Inject 0.3 mg into the muscle once as needed (allergic reactions).    Yes [provider]  esomeprazole (NEXIUM) 40 MG capsule Take 1 capsule (40 mg total) by mouth daily. 04/24/19  Yes Charlesetta Shanks, MD  fluticasone (FLONASE) 50 MCG/ACT nasal spray Place 2 sprays into both nostrils daily. Patient taking differently: Place 2 sprays into both nostrils daily as needed for allergies.  06/08/18  Yes Raylene Everts, MD  gabapentin (NEURONTIN) 300 MG capsule Take 300 mg by mouth 3 (three) times daily.   Yes [provider]  loratadine (CLARITIN) 10 MG tablet Take 10 mg by mouth daily.   Yes [provider]  metoprolol tartrate (LOPRESSOR) 25 MG tablet Take 25 mg by mouth 2 (two) times daily.   Yes [provider]  montelukast (SINGULAIR) 10 MG tablet Take 10 mg by mouth every morning.    Yes [provider]  Multiple Vitamin (MULTIVITAMIN WITH MINERALS) TABS tablet Take 1 tablet by mouth daily.   Yes [provider]  ondansetron (ZOFRAN-ODT) 4 MG  disintegrating tablet Take 1 tablet (4 mg total) by mouth every 8 (eight) hours as needed for nausea or vomiting. 04/21/19  Yes Burky, Lanelle Bal B, NP  sitaGLIPtin-metformin (JANUMET) 50-500 MG tablet Take 1 tablet by mouth daily.   Yes [provider]  amoxicillin (AMOXIL) 875 MG tablet Take 1 tablet (875 mg total) by mouth 2 (two) times daily. Patient not taking: Reported on 05/11/2019 03/11/19   Raylene Everts, MD  doxycycline (VIBRAMYCIN) 100 MG capsule Take 1 capsule (100 mg total) by mouth 2 (two) times daily. Patient not taking: Reported on 05/11/2019 04/17/19   Zigmund Gottron, NP  guaiFENesin (MUCINEX) 600 MG 12 hr tablet Take 1 tablet (600 mg total) by mouth 2 (two) times daily. Patient not taking: Reported on 05/11/2019 04/24/19  Charlesetta Shanks, MD  methylPREDNISolone (MEDROL DOSEPAK) 4 MG TBPK tablet Per pack instruction Patient not taking: Reported on 05/11/2019 04/24/19   Charlesetta Shanks, MD  pantoprazole (PROTONIX) 20 MG tablet Take 2 tablets (40 mg total) by mouth daily for 14 days. 05/11/19 05/25/19  Ginia Rudell S, PA-C  sucralfate (CARAFATE) 1 g tablet Take 1 tablet (1 g total) by mouth 3 (three) times daily with meals for 14 days. 05/11/19 05/25/19  Avira Tillison S, PA-C    Family History Family History  Problem Relation Age of Onset   Diabetes Father     Social History Social History   Tobacco Use   Smoking status: Former Smoker   Smokeless tobacco: Never Used  Substance Use Topics   Alcohol use: No   Drug use: No     Allergies   Crab [shellfish allergy]; Erythromycin; Fish allergy; Metoclopramide; Doxycycline; Levaquin [levofloxacin in d5w]; Morphine; Morphine and related; and Sulfa antibiotics   Review of Systems Review of Systems  Constitutional: Negative for fever.  HENT: Negative for ear pain and sore throat.   Eyes: Negative for visual disturbance.  Respiratory: Negative for shortness of breath.   Cardiovascular: Negative for chest pain,  palpitations and leg swelling.  Gastrointestinal: Positive for abdominal pain, blood in stool, constipation and nausea. Negative for diarrhea and vomiting.  Genitourinary: Negative for dysuria and hematuria.  Musculoskeletal: Negative for back pain.  Skin: Negative for rash.  Neurological: Negative for headaches.  All other systems reviewed and are negative.    Physical Exam Updated Vital Signs BP (!) 142/70    Pulse 96    Temp 98.5 F (36.9 C) (Oral)    Resp 20    Ht 5\' 7"  (1.702 m)    Wt 92.1 kg    SpO2 100%    BMI 31.79 kg/m   Physical Exam Vitals signs and nursing note reviewed.  Constitutional:      General: She is not in acute distress.    Appearance: She is well-developed. She is not ill-appearing or toxic-appearing.  HENT:     Head: Normocephalic and atraumatic.  Eyes:     Conjunctiva/sclera: Conjunctivae normal.  Neck:     Musculoskeletal: Neck supple.  Cardiovascular:     Rate and Rhythm: Normal rate and regular rhythm.     Heart sounds: Normal heart sounds. No murmur.  Pulmonary:     Effort: Pulmonary effort is normal. No respiratory distress.     Breath sounds: Normal breath sounds. No wheezing, rhonchi or rales.  Abdominal:     Palpations: Abdomen is soft.     Tenderness: There is abdominal tenderness in the epigastric area. There is guarding (voluntary). There is no rebound.  Genitourinary:    Comments: Chaperone present. No gross blood or melena on exam. No external hemorrhoids visualized and no internal hemorrhoids palpated.  Skin:    General: Skin is warm and dry.  Neurological:     Mental Status: She is alert.    ED Treatments / Results  Labs (all labs ordered are listed, but only abnormal results are displayed) Labs Reviewed  COMPREHENSIVE METABOLIC PANEL - Abnormal; Notable for the following components:      Result Value   Glucose, Bld 109 (*)    Total Protein 6.3 (*)    All other components within normal limits  URINALYSIS, ROUTINE W REFLEX  MICROSCOPIC - Abnormal; Notable for the following components:   Color, Urine COLORLESS (*)    Specific Gravity, Urine 1.002 (*)  All other components within normal limits  LIPASE, BLOOD  CBC  POC OCCULT BLOOD, ED  TYPE AND SCREEN  ABO/RH    EKG EKG Interpretation  Date/Time:  Saturday May 11 2019 06:50:34 EDT Ventricular Rate:  66 PR Interval:    QRS Duration: 97 QT Interval:  398 QTC Calculation: 417 R Axis:   11 Text Interpretation:  Sinus rhythm Low voltage, precordial leads Otherwise within normal limits When compared with ECG of 04/24/19, No significant change was found Confirmed by Delora Fuel [id number]) on 05/11/2019 6:52:33 AM Also confirmed by Delora Fuel ([id number]), editor Philomena Doheny [phone])  on 05/11/2019 7:24:22 AM   Radiology Ct Abdomen Pelvis W Contrast  Result Date: 05/11/2019 CLINICAL DATA:  Abdominal pain.  Rectal bleeding. EXAM: CT ABDOMEN AND PELVIS WITH CONTRAST TECHNIQUE: Multidetector CT imaging of the abdomen and pelvis was performed using the standard protocol following bolus administration of intravenous contrast. CONTRAST:  168mL OMNIPAQUE IOHEXOL 300 MG/ML  SOLN COMPARISON:  None. FINDINGS: Lower chest: There is bibasilar atelectasis. There is no lung base edema or consolidation. Hepatobiliary: There is hepatic steatosis. There is a cyst in the medial aspect of the anterior segment right lobe of the liver measuring 1.5 x 1.0 cm. There is no gallbladder wall thickening. There is no biliary duct dilatation. Pancreas: No pancreatic mass or inflammatory focus. Spleen: No focal splenic lesions evident. Adrenals/Urinary Tract: Adrenals bilaterally appear normal. Kidneys bilaterally show no evident mass or hydronephrosis on either side. There is no appreciable renal or ureteral calculus on either side. Urinary bladder is midline with wall thickness within normal limits. Stomach/Bowel: There is no appreciable bowel wall or mesenteric thickening. There is no evident bowel  obstruction. Terminal ileum appears normal. There is no evident free air or portal venous air. Vascular/Lymphatic: There is aortic atherosclerosis. No aneurysm evident. No demonstrable adenopathy in the abdomen or pelvis. Reproductive: Uterus is absent.  There is no evident pelvic mass. Other: The appendix appears normal. There is no evident adenopathy or abscess in the abdomen or pelvis. There is thinning of the midline rectus muscle at the umbilicus. There is fat in each inguinal ring. Musculoskeletal: There is degenerative change in the lower thoracic and lumbar regions. There are no blastic or lytic bone lesions. There are no intramuscular lesions. IMPRESSION: 1. A cause for patient's symptoms has not been established with this study. In particular, a cause for rectal bleeding is not been established with this study. This finding may well warrant direct visualization of the colon after appropriate colonic cleansing. 2. No evident bowel wall thickening or bowel obstruction. No abscess in the abdomen pelvis. Appendix appears normal. 3. No evident renal or ureteral calculus. No hydronephrosis. Urinary bladder wall thickness is normal. 4.  Hepatic steatosis. 5.  Uterus absent. Electronically Signed   By: Lowella Grip III M.D.   On: 05/11/2019 09:44    Procedures Procedures (including critical care time)  Medications Ordered in ED Medications  0.9 %  sodium chloride infusion (250 mLs Intravenous New Bag/Given 05/11/19 1016)  sodium chloride flush (NS) 0.9 % injection 3 mL (3 mLs Intravenous Given 05/11/19 0807)  sodium chloride 0.9 % bolus 1,000 mL (0 mLs Intravenous Stopped 05/11/19 0815)  ondansetron (ZOFRAN) injection 4 mg (4 mg Intravenous Given 05/11/19 0812)  sucralfate (CARAFATE) tablet 1 g (1 g Oral Given 05/11/19 0804)  alum & mag hydroxide-simeth (MAALOX/MYLANTA) 200-200-20 MG/5ML suspension 30 mL (30 mLs Oral Given 05/11/19 0803)  pantoprazole (PROTONIX) injection 40 mg (40 mg Intravenous Given  05/11/19 0816)  famotidine (PEPCID) IVPB 20 mg premix (20 mg Intravenous New Bag/Given 05/11/19 1017)  dicyclomine (BENTYL) capsule 10 mg (10 mg Oral Given 05/11/19 1017)  iohexol (OMNIPAQUE) 300 MG/ML solution 100 mL (100 mLs Intravenous Contrast Given 05/11/19 0904)     Initial Impression / Assessment and Plan / ED Course  I have reviewed the triage vital signs and the nursing notes.  Pertinent labs & imaging results that were available during my care of the patient were reviewed by me and considered in my medical decision making (see chart for details).     Final Clinical Impressions(s) / ED Diagnoses   Final diagnoses:  Esophagitis  Bright red blood per rectum   Pt is a 63 y/o female with a h/o GERD, PUD, s/p esophageal dilation x2 presenting with c/o laryngospasm, acid reflux sxs, epigastric abd pain, and an episode of rectal bleeding. Of note pt has h/o esophagitis. She recently finished course of doxycycline for PNA.   CBC without leukocytosis, no anemia.  CMP is benign Lipase negative UA with no leukocytes/nitrites to suggest UTI. No hematuria.  EKG nonischemic and unchanged from prior.  CT abd/pelvis: cause for patient's symptoms has not been established with this study. In particular, a cause for rectal bleeding is not been established with this study. This finding may well warrant direct visualization of the colon after appropriate colonic cleansing. No evident bowel wall thickening or bowel obstruction. No abscess in the abdomen pelvis. Appendix appears normal. No evident renal or ureteral calculus. No hydronephrosis. Urinary bladder wall thickness is normal. Hepatic steatosis. Uterus absent.   8:54 AM Pt given Zofran, Pepcid, Carafate, GI cocktail. On reassessment she reports that sxs have improved some but she is still having some reflux. Will order additional meds.   With regard to rectal bleeding, there is no gross blood or melena on exam. Hemoccult is negative. Pt had a  single episode and sxs have not recurred. Hgb is stable. I have low suspicion for complicated or life threatening bleeding.  With regard to epigastric abd pain/GERD sxs/laryngospasm, I have suspicion for esophagitis given pts recent use of doxycycline. I advised continuation of PPI and will also give rx for carafate. She was able to tolerate PO in the ED including water and a cup of applesauce. I will give referral to GI, if sxs continue she may benefit from EGD or barium swallow. Advised her to return to the ed for new or worsening sxs. She voices understanding of the plan and reasons to return. All questions answered, pt stable for d/c.   ED Discharge Orders         Ordered    sucralfate (CARAFATE) 1 g tablet  3 times daily with meals     05/11/19 1038    pantoprazole (PROTONIX) 20 MG tablet  Daily     05/11/19 [address] [place] Dani S, PA-C 05/11/19 1050    Gareth Morgan, MD 05/12/19 [phone]

## 2019-05-11 NOTE — ED Notes (Signed)
ED Provider at bedside. 

## 2019-05-11 NOTE — ED Notes (Signed)
Patient transported to CT 

## 2019-05-11 NOTE — ED Notes (Signed)
Pt returned to room from CT

## 2019-05-11 NOTE — ED Triage Notes (Signed)
Pt arrive POV d/t upper abd pain that startex 1wk ago w/ rectal bleeding that started on Monday. Pt states she's been having difficulty swallowing over a week but intensify this AM

## 2019-05-29 ENCOUNTER — Emergency Department (HOSPITAL_COMMUNITY)
Admission: EM | Admit: 2019-05-29 | Discharge: 2019-05-30 | Disposition: A | Discharge status: Home / Self Care | Payer: Medicare HMO | Attending: Emergency Medicine | Admitting: Emergency Medicine

## 2019-05-29 ENCOUNTER — Other Ambulatory Visit: Payer: Self-pay

## 2019-05-29 DIAGNOSIS — Y929 Unspecified place or not applicable: Secondary | ICD-10-CM | POA: Insufficient documentation

## 2019-05-29 DIAGNOSIS — W01190A Fall on same level from slipping, tripping and stumbling with subsequent striking against furniture, initial encounter: Secondary | ICD-10-CM | POA: Diagnosis not present

## 2019-05-29 DIAGNOSIS — Z23 Encounter for immunization: Secondary | ICD-10-CM | POA: Insufficient documentation

## 2019-05-29 DIAGNOSIS — Z79899 Other long term (current) drug therapy: Secondary | ICD-10-CM | POA: Insufficient documentation

## 2019-05-29 DIAGNOSIS — Y999 Unspecified external cause status: Secondary | ICD-10-CM | POA: Insufficient documentation

## 2019-05-29 DIAGNOSIS — S81012A Laceration without foreign body, left knee, initial encounter: Secondary | ICD-10-CM | POA: Diagnosis not present

## 2019-05-29 DIAGNOSIS — Y9301 Activity, walking, marching and hiking: Secondary | ICD-10-CM | POA: Diagnosis not present

## 2019-05-29 DIAGNOSIS — E119 Type 2 diabetes mellitus without complications: Secondary | ICD-10-CM | POA: Insufficient documentation

## 2019-05-29 DIAGNOSIS — Z7984 Long term (current) use of oral hypoglycemic drugs: Secondary | ICD-10-CM | POA: Insufficient documentation

## 2019-05-29 DIAGNOSIS — Z87891 Personal history of nicotine dependence: Secondary | ICD-10-CM | POA: Diagnosis not present

## 2019-05-29 NOTE — ED Triage Notes (Signed)
Per pt she fell at home and fell through a glass table. She has a laceration on her left knee. Pt pulled glass out of area.

## 2019-05-30 DIAGNOSIS — S81012A Laceration without foreign body, left knee, initial encounter: Secondary | ICD-10-CM | POA: Diagnosis not present

## 2019-05-30 MED ORDER — LIDOCAINE-EPINEPHRINE (PF) 2 %-1:200000 IJ SOLN
10.0000 mL | Freq: Once | INTRAMUSCULAR | Status: AC
  Administered 2019-05-30: 10 mL
  Filled 2019-05-30: qty 20

## 2019-05-30 MED ORDER — TETANUS-DIPHTH-ACELL PERTUSSIS 5-2.5-18.5 LF-MCG/0.5 IM SUSP
0.5000 mL | Freq: Once | INTRAMUSCULAR | Status: AC
  Administered 2019-05-30: 0.5 mL via INTRAMUSCULAR
  Filled 2019-05-30: qty 0.5

## 2019-05-30 NOTE — ED Notes (Signed)
Patient verbalized understanding of dc instructions, vss, ambulatory with nad.   

## 2019-05-30 NOTE — ED Provider Notes (Signed)
[facility] EMERGENCY DEPARTMENT Provider Note   CSN: [id number] Arrival date & time: 05/29/19  2334    History   Chief Complaint Chief Complaint  Patient presents with  . Extremity Laceration    HPI Pamela Roberson is a 63 y.o. female.     63 year old female with a history of diabetes, esophageal reflux, hypertension presents to the ED for evaluation of laceration to her left knee.  She states that she was walking when she lost her balance and fell through a glass table.  She reports pulling a piece of glass out of her knee.  Has some aggravated pain with weightbearing.  No medications taken prior to arrival.  Denies the use of chronic anticoagulants.  Cannot recall the date of her last tetanus shot.  The history is provided by the patient. No language interpreter was used.    Past Medical History:  Diagnosis Date  . Arthritis   . Diabetes mellitus without complication ([place])    type II   . Diverticulitis   . Dyspnea    mild   . GERD (gastroesophageal reflux disease)   . History of bronchitis   . Hypertension   . IBS (irritable bowel syndrome)   . Palpitations   . Sleep apnea    mild but no sleep apnea   . TMJ (dislocation of temporomandibular joint)   . 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 Dizzy 12/11/2017  . Pharyngeal dysphagia 12/11/2017  . Post-nasal drip 12/11/2017  . Referred ear pain, bilateral 12/11/2017  . Diabetes mellitus without complication ([place]) 27/25/3664  . Family history of glaucoma 06/29/2017  . Hyperopia of both eyes with astigmatism and presbyopia 06/29/2017  . Keratoconjunctivitis sicca of both eyes not specified as Sjogren's 06/29/2017  . Nuclear sclerotic cataract of both eyes 06/29/2017  . Vitreous floater, bilateral 06/29/2017  . Biceps tendinitis of right shoulder 04/05/2017  . Impingement syndrome of right shoulder 04/05/2017  . Arthritis of right  acromioclavicular joint 04/05/2017  . Partial nontraumatic tear of rotator cuff, right 04/05/2017    Past Surgical History:  Procedure Laterality Date  . ABDOMINAL HYSTERECTOMY    . ESOPHAGOGASTRODUODENOSCOPY ENDOSCOPY     with esophagus being stretched twice per pt,   . EYE SURGERY    . 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 feeding tube placement and removal   2015  . NASAL SEPTUM SURGERY    . spurs removed from esophagus   2015  . TONSILLECTOMY       OB History   No obstetric history on file.      Home Medications    Prior to Admission medications   Medication Sig Start Date End Date Taking? Authorizing Provider  acetaminophen-codeine (TYLENOL #3) 300-30 MG tablet Take 1-2 tablets by mouth every 6 (six) hours as needed for moderate pain. 04/24/19   Charlesetta Shanks, MD  acyclovir (ZOVIRAX) 400 MG tablet Take 400 mg by mouth 2 (two) times daily.    [provider]  albuterol (PROVENTIL HFA;VENTOLIN HFA) 108 (90 BASE) MCG/ACT inhaler Inhale 2 puffs into the lungs every 4 (four) hours as needed for wheezing or shortness of breath.     [provider]  albuterol (PROVENTIL) (2.5 MG/3ML) 0.083% nebulizer solution Take 3 mLs (2.5 mg total) by nebulization every 4 (four) hours as needed for wheezing or shortness of breath. 03/11/19  Raylene Everts, MD  aluminum-magnesium hydroxide-simethicone (MAALOX) 200-200-20 MG/5ML SUSP Take 30 mLs by mouth 4 (four) times daily -  before meals and at bedtime. Patient taking differently: Take 30 mLs by mouth 3 (three) times daily as needed (acid reflux).  04/21/19   Zigmund Gottron, NP  amoxicillin (AMOXIL) 875 MG tablet Take 1 tablet (875 mg total) by mouth 2 (two) times daily. Patient not taking: Reported on 05/11/2019 03/11/19   Raylene Everts, MD  Artificial Tear Ointment (DRY EYES OP) Apply 1-2 drops to eye daily as needed (for dry eyes).    [provider]  atorvastatin (LIPITOR) 10 MG tablet Take 10 mg by mouth  daily.    [provider]  doxycycline (VIBRAMYCIN) 100 MG capsule Take 1 capsule (100 mg total) by mouth 2 (two) times daily. Patient not taking: Reported on 05/11/2019 04/17/19   Augusto Gamble B, NP  EPINEPHrine 0.3 mg/0.3 mL IJ SOAJ injection Inject 0.3 mg into the muscle once as needed (allergic reactions).     [provider]  esomeprazole (NEXIUM) 40 MG capsule Take 1 capsule (40 mg total) by mouth daily. 04/24/19   Charlesetta Shanks, MD  fluticasone (FLONASE) 50 MCG/ACT nasal spray Place 2 sprays into both nostrils daily. Patient taking differently: Place 2 sprays into both nostrils daily as needed for allergies.  06/08/18   Raylene Everts, MD  gabapentin (NEURONTIN) 300 MG capsule Take 300 mg by mouth 3 (three) times daily.    [provider]  guaiFENesin (MUCINEX) 600 MG 12 hr tablet Take 1 tablet (600 mg total) by mouth 2 (two) times daily. Patient not taking: Reported on 05/11/2019 04/24/19   Charlesetta Shanks, MD  loratadine (CLARITIN) 10 MG tablet Take 10 mg by mouth daily.    [provider]  methylPREDNISolone (MEDROL DOSEPAK) 4 MG TBPK tablet Per pack instruction Patient not taking: Reported on 05/11/2019 04/24/19   Charlesetta Shanks, MD  metoprolol tartrate (LOPRESSOR) 25 MG tablet Take 25 mg by mouth 2 (two) times daily.    [provider]  montelukast (SINGULAIR) 10 MG tablet Take 10 mg by mouth every morning.     [provider]  Multiple Vitamin (MULTIVITAMIN WITH MINERALS) TABS tablet Take 1 tablet by mouth daily.    [provider]  ondansetron (ZOFRAN-ODT) 4 MG disintegrating tablet Take 1 tablet (4 mg total) by mouth every 8 (eight) hours as needed for nausea or vomiting. 04/21/19   Zigmund Gottron, NP  pantoprazole (PROTONIX) 20 MG tablet Take 2 tablets (40 mg total) by mouth daily for 14 days. 05/11/19 05/25/19  Couture, Cortni S, PA-C  sitaGLIPtin-metformin (JANUMET) 50-500 MG tablet Take 1 tablet by mouth daily.     [provider]  sucralfate (CARAFATE) 1 g tablet Take 1 tablet (1 g total) by mouth 3 (three) times daily with meals for 14 days. 05/11/19 05/25/19  Couture, Cortni S, PA-C    Family History Family History  Problem Relation Age of Onset  . Diabetes Father     Social History Social History   Tobacco Use  . Smoking status: Former Research scientist (life sciences)  . Smokeless tobacco: Never Used  Substance Use Topics  . Alcohol use: No  . Drug use: No     Allergies   Crab [shellfish allergy], Erythromycin, Fish allergy, Metoclopramide, Doxycycline, Levaquin [levofloxacin in d5w], Morphine, Morphine and related, and Sulfa antibiotics   Review of Systems Review of Systems Ten systems reviewed and are negative for acute change, except  as noted in the HPI.    Physical Exam Updated Vital Signs BP 125/78   Pulse 86   Temp 98.6 F (37 C) (Oral)   Resp 18   SpO2 96%   Physical Exam Vitals signs and nursing note reviewed.  Constitutional:      General: She is not in acute distress.    Appearance: She is well-developed. She is not diaphoretic.     Comments: Nontoxic appearing and in NAD  HENT:     Head: Normocephalic and atraumatic.  Eyes:     General: No scleral icterus.    Conjunctiva/sclera: Conjunctivae normal.  Neck:     Musculoskeletal: Normal range of motion.  Pulmonary:     Effort: Pulmonary effort is normal. No respiratory distress.     Comments: Respirations even and unlabored Musculoskeletal: Normal range of motion.  Skin:    General: Skin is warm and dry.     Coloration: Skin is not pale.     Findings: No erythema or rash.          Comments: 3cm laceration to the left knee with exposure subcutaneous fat. No visible or palpable foreign body. Bleeding controlled.  Neurological:     Mental Status: She is alert and oriented to person, place, and time.  Psychiatric:        Behavior: Behavior normal.      ED Treatments / Results  Labs (all labs ordered are listed, but  only abnormal results are displayed) Labs Reviewed - No data to display  EKG None  Radiology No results found.  Procedures Procedures (including critical care time)  LACERATION REPAIR Performed by: Antonietta Breach Authorized by: Antonietta Breach Consent: Verbal consent obtained. Risks and benefits: risks, benefits and alternatives were discussed Consent given by: patient Patient identity confirmed: provided demographic data Prepped and Draped in normal sterile fashion Wound explored  Laceration Location: L knee  Laceration Length: 3cm  No Foreign Bodies seen or palpated  Anesthesia: local infiltration  Local anesthetic: lidocaine 2% with epinephrine  Anesthetic total: 4 ml  Irrigation method: syringe Amount of cleaning: standard  Skin closure: 4-0 ethilon  Number of sutures: 6  Technique: simple interrupted  Patient tolerance: Patient tolerated the procedure well with no immediate complications.   Medications Ordered in ED Medications  Tdap (BOOSTRIX) injection 0.5 mL (0.5 mLs Intramuscular Given 05/30/19 0011)  lidocaine-EPINEPHrine (XYLOCAINE W/EPI) 2 %-1:200000 (PF) injection 10 mL (10 mLs Infiltration Given 05/30/19 0011)     Initial Impression / Assessment and Plan / ED Course  I have reviewed the triage vital signs and the nursing notes.  Pertinent labs & imaging results that were available during my care of the patient were reviewed by me and considered in my medical decision making (see chart for details).        Tdap booster given. Pressure irrigation performed. Laceration occurred < 8 hours prior to repair which was well tolerated. Pt has no comorbidities to effect normal wound healing. Discussed suture home care with patien and answered questions. Patient to follow up for wound check and suture removal in 10-14 days. Patient is hemodynamically stable with no complaints prior to discharge.     Final Clinical Impressions(s) / ED Diagnoses   Final  diagnoses:  Laceration of knee, left, initial encounter    ED Discharge Orders    None       Antonietta Breach, PA-C 05/30/19 0104    Merryl Hacker, MD 05/30/19 [phone]

## 2019-06-11 ENCOUNTER — Encounter: Payer: Self-pay | Admitting: Podiatry

## 2019-06-11 ENCOUNTER — Ambulatory Visit (INDEPENDENT_AMBULATORY_CARE_PROVIDER_SITE_OTHER): Payer: Medicare HMO | Admitting: Orthotics

## 2019-06-11 ENCOUNTER — Ambulatory Visit (INDEPENDENT_AMBULATORY_CARE_PROVIDER_SITE_OTHER): Payer: Medicare HMO | Admitting: Podiatry

## 2019-06-11 ENCOUNTER — Other Ambulatory Visit: Payer: Self-pay

## 2019-06-11 VITALS — Temp 98.2°F

## 2019-06-11 DIAGNOSIS — B351 Tinea unguium: Secondary | ICD-10-CM | POA: Diagnosis not present

## 2019-06-11 DIAGNOSIS — M2011 Hallux valgus (acquired), right foot: Secondary | ICD-10-CM

## 2019-06-11 DIAGNOSIS — M2012 Hallux valgus (acquired), left foot: Secondary | ICD-10-CM | POA: Diagnosis not present

## 2019-06-11 DIAGNOSIS — L84 Corns and callosities: Secondary | ICD-10-CM

## 2019-06-11 DIAGNOSIS — E1142 Type 2 diabetes mellitus with diabetic polyneuropathy: Secondary | ICD-10-CM | POA: Diagnosis not present

## 2019-06-11 DIAGNOSIS — M79675 Pain in left toe(s): Secondary | ICD-10-CM | POA: Diagnosis not present

## 2019-06-11 DIAGNOSIS — M79674 Pain in right toe(s): Secondary | ICD-10-CM | POA: Diagnosis not present

## 2019-06-11 NOTE — Patient Instructions (Signed)
Diabetes Mellitus and Foot Care Foot care is an important part of your health, especially when you have diabetes. Diabetes may cause you to have problems because of poor blood flow (circulation) to your feet and legs, which can cause your skin to:  Become thinner and drier.  Break more easily.  Heal more slowly.  Peel and crack. You may also have nerve damage (neuropathy) in your legs and feet, causing decreased feeling in them. This means that you may not notice minor injuries to your feet that could lead to more serious problems. Noticing and addressing any potential problems early is the best way to prevent future foot problems. How to care for your feet Foot hygiene  Wash your feet daily with warm water and mild soap. Do not use hot water. Then, pat your feet and the areas between your toes until they are completely dry. Do not soak your feet as this can dry your skin.  Trim your toenails straight across. Do not dig under them or around the cuticle. File the edges of your nails with an emery board or nail file.  Apply a moisturizing lotion or petroleum jelly to the skin on your feet and to dry, brittle toenails. Use lotion that does not contain alcohol and is unscented. Do not apply lotion between your toes. Shoes and socks  Wear clean socks or stockings every day. Make sure they are not too tight. Do not wear knee-high stockings since they may decrease blood flow to your legs.  Wear shoes that fit properly and have enough cushioning. Always look in your shoes before you put them on to be sure there are no objects inside.  To break in new shoes, wear them for just a few hours a day. This prevents injuries on your feet. Wounds, scrapes, corns, and calluses  Check your feet daily for blisters, cuts, bruises, sores, and redness. If you cannot see the bottom of your feet, use a mirror or ask someone for help.  Do not cut corns or calluses or try to remove them with medicine.  If you  find a minor scrape, cut, or break in the skin on your feet, keep it and the skin around it clean and dry. You may clean these areas with mild soap and water. Do not clean the area with peroxide, alcohol, or iodine.  If you have a wound, scrape, corn, or callus on your foot, look at it several times a day to make sure it is healing and not infected. Check for: ? Redness, swelling, or pain. ? Fluid or blood. ? Warmth. ? Pus or a bad smell. General instructions  Do not cross your legs. This may decrease blood flow to your feet.  Do not use heating pads or hot water bottles on your feet. They may burn your skin. If you have lost feeling in your feet or legs, you may not know this is happening until it is too late.  Protect your feet from hot and cold by wearing shoes, such as at the beach or on hot pavement.  Schedule a complete foot exam at least once a year (annually) or more often if you have foot problems. If you have foot problems, report any cuts, sores, or bruises to your health care provider immediately. Contact a health care provider if:  You have a medical condition that increases your risk of infection and you have any cuts, sores, or bruises on your feet.  You have an injury that is not   healing.  You have redness on your legs or feet.  You feel burning or tingling in your legs or feet.  You have pain or cramps in your legs and feet.  Your legs or feet are numb.  Your feet always feel cold.  You have pain around a toenail. Get help right away if:  You have a wound, scrape, corn, or callus on your foot and: ? You have pain, swelling, or redness that gets worse. ? You have fluid or blood coming from the wound, scrape, corn, or callus. ? Your wound, scrape, corn, or callus feels warm to the touch. ? You have pus or a bad smell coming from the wound, scrape, corn, or callus. ? You have a fever. ? You have a red line going up your leg. Summary  Check your feet every day  for cuts, sores, red spots, swelling, and blisters.  Moisturize feet and legs daily.  Wear shoes that fit properly and have enough cushioning.  If you have foot problems, report any cuts, sores, or bruises to your health care provider immediately.  Schedule a complete foot exam at least once a year (annually) or more often if you have foot problems. This information is not intended to replace advice given to you by your health care provider. Make sure you discuss any questions you have with your health care provider. Document Released: 11/18/2000 Document Revised: 01/03/2018 Document Reviewed: 12/23/2016 Elsevier Patient Education  2020 [employer]  

## 2019-06-11 NOTE — Progress Notes (Signed)

## 2019-06-13 NOTE — Progress Notes (Signed)
Subjective: Pamela Roberson presents with h/o diabetic neuropathy and cc of painful, discolored, thick toenails and painful callus/corn which interfere with activities of daily living. Pain is aggravated when wearing enclosed shoe gear. Pain is relieved with periodic professional debridement.  [facility] [place] is her PCP. Last visit was 3 weeks ago.   Current Outpatient Medications:  .  acetaminophen-codeine (TYLENOL #3) 300-30 MG tablet, Take 1-2 tablets by mouth every 6 (six) hours as needed for moderate pain., Disp: 30 tablet, Rfl: 0 .  acyclovir (ZOVIRAX) 400 MG tablet, Take 400 mg by mouth 2 (two) times daily., Disp: , Rfl:  .  albuterol (PROVENTIL HFA;VENTOLIN HFA) 108 (90 BASE) MCG/ACT inhaler, Inhale 2 puffs into the lungs every 4 (four) hours as needed for wheezing or shortness of breath. , Disp: , Rfl:  .  albuterol (PROVENTIL) (2.5 MG/3ML) 0.083% nebulizer solution, Take 3 mLs (2.5 mg total) by nebulization every 4 (four) hours as needed for wheezing or shortness of breath., Disp: 30 vial, Rfl: 0 .  aluminum-magnesium hydroxide-simethicone (MAALOX) 683-419-62 MG/5ML SUSP, Take 30 mLs by mouth 4 (four) times daily -  before meals and at bedtime. (Patient taking differently: Take 30 mLs by mouth 3 (three) times daily as needed (acid reflux). ), Disp: 710 mL, Rfl: 0 .  amoxicillin (AMOXIL) 875 MG tablet, Take 1 tablet (875 mg total) by mouth 2 (two) times daily., Disp: 14 tablet, Rfl: 0 .  Artificial Tear Ointment (DRY EYES OP), Apply 1-2 drops to eye daily as needed (for dry eyes)., Disp: , Rfl:  .  atorvastatin (LIPITOR) 10 MG tablet, Take 10 mg by mouth daily., Disp: , Rfl:  .  doxycycline (VIBRAMYCIN) 100 MG capsule, Take 1 capsule (100 mg total) by mouth 2 (two) times daily., Disp: 20 capsule, Rfl: 0 .  EPINEPHrine 0.3 mg/0.3 mL IJ SOAJ injection, Inject 0.3 mg into the muscle once as needed (allergic reactions). , Disp: , Rfl:  .  esomeprazole (NEXIUM) 40 MG capsule, Take 1  capsule (40 mg total) by mouth daily., Disp: 30 capsule, Rfl: 0 .  fluticasone (FLONASE) 50 MCG/ACT nasal spray, Place 2 sprays into both nostrils daily. (Patient taking differently: Place 2 sprays into both nostrils daily as needed for allergies. ), Disp: 16 g, Rfl: 0 .  gabapentin (NEURONTIN) 300 MG capsule, Take 300 mg by mouth 3 (three) times daily., Disp: , Rfl:  .  guaiFENesin (MUCINEX) 600 MG 12 hr tablet, Take 1 tablet (600 mg total) by mouth 2 (two) times daily., Disp: 30 tablet, Rfl: 1 .  loratadine (CLARITIN) 10 MG tablet, Take 10 mg by mouth daily., Disp: , Rfl:  .  methylPREDNISolone (MEDROL DOSEPAK) 4 MG TBPK tablet, Per pack instruction, Disp: 21 tablet, Rfl: 0 .  metoprolol tartrate (LOPRESSOR) 25 MG tablet, Take 25 mg by mouth 2 (two) times daily., Disp: , Rfl:  .  montelukast (SINGULAIR) 10 MG tablet, Take 10 mg by mouth every morning. , Disp: , Rfl:  .  Multiple Vitamin (MULTIVITAMIN WITH MINERALS) TABS tablet, Take 1 tablet by mouth daily., Disp: , Rfl:  .  omeprazole (PRILOSEC) 40 MG capsule, , Disp: , Rfl:  .  ondansetron (ZOFRAN-ODT) 4 MG disintegrating tablet, Take 1 tablet (4 mg total) by mouth every 8 (eight) hours as needed for nausea or vomiting., Disp: 12 tablet, Rfl: 0 .  PROCTOZONE-HC 2.5 % rectal cream, APPLY AA BID FOR 14 DAYS., Disp: , Rfl:  .  sitaGLIPtin-metformin (JANUMET) 50-500 MG tablet, Take 1 tablet by  mouth daily., Disp: , Rfl:  .  pantoprazole (PROTONIX) 20 MG tablet, Take 2 tablets (40 mg total) by mouth daily for 14 days., Disp: 28 tablet, Rfl: 0 .  sucralfate (CARAFATE) 1 g tablet, Take 1 tablet (1 g total) by mouth 3 (three) times daily with meals for 14 days., Disp: 42 tablet, Rfl: 0  Allergies  Allergen Reactions  . Crab [Shellfish Allergy] Shortness Of Breath and Swelling  . Erythromycin Swelling and Rash  . Fish Allergy     Swelling   . Metoclopramide Other (See Comments)    Slurred speech and unable to move muscles,  Caused her to drag her  feet  . Doxycycline     GI Intolerance  . Levaquin [Levofloxacin In D5w] Other (See Comments)    Causes irregular heart beat  . Morphine Nausea And Vomiting and Rash    Irregular heart beat  . Morphine And Related Nausea And Vomiting and Rash    Irregular heart beat  . Sulfa Antibiotics Rash    Objective: Vitals:   06/11/19 1016  Temp: 98.2 F (36.8 C)    Vascular Examination: Capillary refill time immediate x 10 digits.  Dorsalis pedis pulses 2/4 b/l.  Posterior tibial pulses 1/4 b/l.  Digital hair diminished x 10 digits.  Skin temperature gradient WNL b/l.  Dermatological Examination: Skin with normal turgor, texture and tone b/l.  Toenails 1-5 b/l discolored, thick, dystrophic with subungual debris and pain with palpation to nailbeds due to thickness of nails.  Hyperkeratotic lesion(s) submet head 5 b/l. No erythema, no edema, no drainage, no flocculence noted.   Musculoskeletal: Muscle strength 5/5 to all LE muscle groups.  HAV with bunion b/l.  No pain, crepitus or joint limitation with passive/active ROM.  Neurological: Sensation intact 5/5 b/l with 10 gram monofilament.  Vibratory sensation diminished b/l.  Assessment: 1. Painful onychomycosis toenails 1-5 b/l 2. Calluses submet head 5 b/l 3. NIDDM with Diabetic neuropathy  Plan: 1. Continue diabetic foot care principles. Literature dispensed on today. 2. Toenails 1-5 b/l were debrided in length and girth without iatrogenic bleeding. 3. Calluses pared submetatarsal head(s) 5 b/l utilizing sterile scalpel blade without incident. Corn(s) pared utilizing sterile scalpel blade without incident.  4. Patient to continue soft, supportive shoe gear 5. Patient to report any pedal injuries to medical professional  6. Follow up 3 months.  7. Patient/POA to call should there be a concern in the interim.

## 2019-06-23 ENCOUNTER — Ambulatory Visit (HOSPITAL_COMMUNITY)
Admission: EM | Admit: 2019-06-23 | Discharge: 2019-06-23 | Disposition: A | Discharge status: Home / Self Care | Payer: Medicare HMO | Attending: Family Medicine | Admitting: Family Medicine

## 2019-06-23 ENCOUNTER — Other Ambulatory Visit: Payer: Self-pay

## 2019-06-23 ENCOUNTER — Encounter (HOSPITAL_COMMUNITY): Payer: Self-pay | Admitting: Urgent Care

## 2019-06-23 DIAGNOSIS — Z882 Allergy status to sulfonamides status: Secondary | ICD-10-CM | POA: Insufficient documentation

## 2019-06-23 DIAGNOSIS — Z79899 Other long term (current) drug therapy: Secondary | ICD-10-CM | POA: Insufficient documentation

## 2019-06-23 DIAGNOSIS — Z1159 Encounter for screening for other viral diseases: Secondary | ICD-10-CM | POA: Insufficient documentation

## 2019-06-23 DIAGNOSIS — Z885 Allergy status to narcotic agent status: Secondary | ICD-10-CM | POA: Insufficient documentation

## 2019-06-23 DIAGNOSIS — G44209 Tension-type headache, unspecified, not intractable: Secondary | ICD-10-CM | POA: Insufficient documentation

## 2019-06-23 DIAGNOSIS — Z91013 Allergy to seafood: Secondary | ICD-10-CM | POA: Diagnosis not present

## 2019-06-23 DIAGNOSIS — Z7984 Long term (current) use of oral hypoglycemic drugs: Secondary | ICD-10-CM | POA: Diagnosis not present

## 2019-06-23 DIAGNOSIS — M19011 Primary osteoarthritis, right shoulder: Secondary | ICD-10-CM | POA: Diagnosis not present

## 2019-06-23 DIAGNOSIS — Z4802 Encounter for removal of sutures: Secondary | ICD-10-CM | POA: Insufficient documentation

## 2019-06-23 DIAGNOSIS — H2513 Age-related nuclear cataract, bilateral: Secondary | ICD-10-CM | POA: Insufficient documentation

## 2019-06-23 DIAGNOSIS — G4730 Sleep apnea, unspecified: Secondary | ICD-10-CM | POA: Insufficient documentation

## 2019-06-23 DIAGNOSIS — I10 Essential (primary) hypertension: Secondary | ICD-10-CM | POA: Diagnosis not present

## 2019-06-23 DIAGNOSIS — K219 Gastro-esophageal reflux disease without esophagitis: Secondary | ICD-10-CM | POA: Insufficient documentation

## 2019-06-23 DIAGNOSIS — Z833 Family history of diabetes mellitus: Secondary | ICD-10-CM | POA: Diagnosis not present

## 2019-06-23 DIAGNOSIS — Z87891 Personal history of nicotine dependence: Secondary | ICD-10-CM | POA: Diagnosis not present

## 2019-06-23 DIAGNOSIS — Z792 Long term (current) use of antibiotics: Secondary | ICD-10-CM | POA: Diagnosis not present

## 2019-06-23 DIAGNOSIS — Z7952 Long term (current) use of systemic steroids: Secondary | ICD-10-CM | POA: Insufficient documentation

## 2019-06-23 DIAGNOSIS — Z881 Allergy status to other antibiotic agents status: Secondary | ICD-10-CM | POA: Diagnosis not present

## 2019-06-23 DIAGNOSIS — J302 Other seasonal allergic rhinitis: Secondary | ICD-10-CM | POA: Insufficient documentation

## 2019-06-23 DIAGNOSIS — E1136 Type 2 diabetes mellitus with diabetic cataract: Secondary | ICD-10-CM | POA: Insufficient documentation

## 2019-06-23 MED ORDER — ONDANSETRON 4 MG PO TBDP
ORAL_TABLET | ORAL | Status: AC
  Filled 2019-06-23: qty 1

## 2019-06-23 MED ORDER — KETOROLAC TROMETHAMINE 60 MG/2ML IM SOLN
INTRAMUSCULAR | Status: AC
  Filled 2019-06-23: qty 2

## 2019-06-23 MED ORDER — ONDANSETRON 4 MG PO TBDP
4.0000 mg | ORAL_TABLET | Freq: Once | ORAL | Status: AC
  Administered 2019-06-23: 4 mg via ORAL

## 2019-06-23 MED ORDER — DEXAMETHASONE SODIUM PHOSPHATE 10 MG/ML IJ SOLN
INTRAMUSCULAR | Status: AC
  Filled 2019-06-23: qty 1

## 2019-06-23 MED ORDER — KETOROLAC TROMETHAMINE 60 MG/2ML IM SOLN
60.0000 mg | Freq: Once | INTRAMUSCULAR | Status: AC
  Administered 2019-06-23: 60 mg via INTRAMUSCULAR

## 2019-06-23 MED ORDER — DEXAMETHASONE SODIUM PHOSPHATE 10 MG/ML IJ SOLN
10.0000 mg | Freq: Once | INTRAMUSCULAR | Status: AC
  Administered 2019-06-23: 10 mg via INTRAMUSCULAR

## 2019-06-23 NOTE — ED Triage Notes (Signed)
C/O intermittent HA to various areas of head over past 2 wks with intermittent blurred vision.  Also c/o sore throat, chills.  Reports diarrhea last night.  Pt has few family members ill @ this time - unk if Covid as no results yet.

## 2019-06-23 NOTE — ED Triage Notes (Signed)
Also requesting suture removal left knee; states were supposed to be removed 4 days ago.  No S/S infection.  Scabbing noted.

## 2019-06-23 NOTE — ED Provider Notes (Signed)
[place]    CSN: [id number] Arrival date & time: 06/23/19  1646     History   Chief Complaint Chief Complaint  Patient presents with  . Sore Throat  . Headache  . Blurred Vision  . Suture / Staple Removal    HPI Pamela Roberson is a 63 y.o. female with history of diabetes, GERD, hypertension presenting for numerous complaints.  Patient states that she has felt ill for over 2 weeks now.  Has had intermittent headaches, sore throat, diarrhea, nausea, abdominal discomfort, weakness.  Patient states she started to feel better, though is still endorsing diarrhea that was watery and without blood yesterday (no bowel movement today) and some nausea without vomiting today.  Patient endorsing headache that goes from the back of her head down to her neck and shoulders.  Patient endorsing tightness, achiness.  Has taken ibuprofen for this in the past with adequate relief of symptoms, has not taken anything today.    Past Medical History:  Diagnosis Date  . Arthritis   . Diabetes mellitus without complication ([place])    type II   . Diverticulitis   . Dyspnea    mild   . GERD (gastroesophageal reflux disease)   . History of bronchitis   . Hypertension   . IBS (irritable bowel syndrome)   . Palpitations   . Sleep apnea    mild but no sleep apnea   . TMJ (dislocation of temporomandibular joint)   . 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 Dizzy 12/11/2017  . Pharyngeal dysphagia 12/11/2017  . Post-nasal drip 12/11/2017  . Referred ear pain, bilateral 12/11/2017  . Diabetes mellitus without complication ([place]) 09/47/0962  . Family history of glaucoma 06/29/2017  . Hyperopia of both eyes with astigmatism and presbyopia 06/29/2017  . Keratoconjunctivitis sicca of both eyes not specified as Sjogren's 06/29/2017  . Nuclear sclerotic cataract of both eyes 06/29/2017  . Vitreous floater, bilateral 06/29/2017  .  Biceps tendinitis of right shoulder 04/05/2017  . Impingement syndrome of right shoulder 04/05/2017  . Arthritis of right acromioclavicular joint 04/05/2017  . Partial nontraumatic tear of rotator cuff, right 04/05/2017    Past Surgical History:  Procedure Laterality Date  . ABDOMINAL HYSTERECTOMY    . ESOPHAGOGASTRODUODENOSCOPY ENDOSCOPY     with esophagus being stretched twice per pt,   . EYE SURGERY    . 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 feeding tube placement and removal   2015  . NASAL SEPTUM SURGERY    . spurs removed from esophagus   2015  . TONSILLECTOMY      OB History   No obstetric history on file.      Home Medications    Prior to Admission medications   Medication Sig Start Date End Date Taking? Authorizing Provider  atorvastatin (LIPITOR) 10 MG tablet Take 10 mg by mouth daily.   Yes [provider]  Dexlansoprazole (DEXILANT PO) Take by mouth.   Yes [provider]  metoprolol tartrate (LOPRESSOR) 25 MG tablet Take 25 mg by mouth 2 (two) times daily.   Yes [provider]  montelukast (SINGULAIR) 10 MG tablet Take 10 mg by mouth every morning.    Yes [provider]  Multiple Vitamin (MULTIVITAMIN WITH MINERALS) TABS tablet Take 1 tablet by mouth daily.   Yes [provider]  sitaGLIPtin-metformin (JANUMET) 50-500 MG  tablet Take 1 tablet by mouth daily.   Yes [provider]  acetaminophen-codeine (TYLENOL #3) 300-30 MG tablet Take 1-2 tablets by mouth every 6 (six) hours as needed for moderate pain. 04/24/19   Charlesetta Shanks, MD  acyclovir (ZOVIRAX) 400 MG tablet Take 400 mg by mouth 2 (two) times daily.    [provider]  albuterol (PROVENTIL HFA;VENTOLIN HFA) 108 (90 BASE) MCG/ACT inhaler Inhale 2 puffs into the lungs every 4 (four) hours as needed for wheezing or shortness of breath.     [provider]  albuterol (PROVENTIL) (2.5 MG/3ML) 0.083% nebulizer solution Take 3 mLs (2.5  mg total) by nebulization every 4 (four) hours as needed for wheezing or shortness of breath. 03/11/19   Raylene Everts, MD  aluminum-magnesium hydroxide-simethicone (MAALOX) 200-200-20 MG/5ML SUSP Take 30 mLs by mouth 4 (four) times daily -  before meals and at bedtime. Patient taking differently: Take 30 mLs by mouth 3 (three) times daily as needed (acid reflux).  04/21/19   Zigmund Gottron, NP  amoxicillin (AMOXIL) 875 MG tablet Take 1 tablet (875 mg total) by mouth 2 (two) times daily. 03/11/19   Raylene Everts, MD  Artificial Tear Ointment (DRY EYES OP) Apply 1-2 drops to eye daily as needed (for dry eyes).    [provider]  doxycycline (VIBRAMYCIN) 100 MG capsule Take 1 capsule (100 mg total) by mouth 2 (two) times daily. 04/17/19   Augusto Gamble B, NP  EPINEPHrine 0.3 mg/0.3 mL IJ SOAJ injection Inject 0.3 mg into the muscle once as needed (allergic reactions).     [provider]  esomeprazole (NEXIUM) 40 MG capsule Take 1 capsule (40 mg total) by mouth daily. 04/24/19   Charlesetta Shanks, MD  fluticasone (FLONASE) 50 MCG/ACT nasal spray Place 2 sprays into both nostrils daily. Patient taking differently: Place 2 sprays into both nostrils daily as needed for allergies.  06/08/18   Raylene Everts, MD  gabapentin (NEURONTIN) 300 MG capsule Take 300 mg by mouth 3 (three) times daily.    [provider]  guaiFENesin (MUCINEX) 600 MG 12 hr tablet Take 1 tablet (600 mg total) by mouth 2 (two) times daily. 04/24/19   Charlesetta Shanks, MD  loratadine (CLARITIN) 10 MG tablet Take 10 mg by mouth daily.    [provider]  methylPREDNISolone (MEDROL DOSEPAK) 4 MG TBPK tablet Per pack instruction 04/24/19   Charlesetta Shanks, MD  omeprazole (PRILOSEC) 40 MG capsule  05/16/19   [provider]  ondansetron (ZOFRAN-ODT) 4 MG disintegrating tablet Take 1 tablet (4 mg total) by mouth every 8 (eight) hours as needed for nausea or vomiting. 04/21/19   Zigmund Gottron,  NP  pantoprazole (PROTONIX) 20 MG tablet Take 2 tablets (40 mg total) by mouth daily for 14 days. 05/11/19 05/25/19  Couture, Cortni S, PA-C  PROCTOZONE-HC 2.5 % rectal cream APPLY AA BID FOR 14 DAYS. 05/23/19   [provider]  sucralfate (CARAFATE) 1 g tablet Take 1 tablet (1 g total) by mouth 3 (three) times daily with meals for 14 days. 05/11/19 05/25/19  Couture, Cortni S, PA-C    Family History Family History  Problem Relation Age of Onset  . Diabetes Father     Social History Social History   Tobacco Use  . Smoking status: Former Research scientist (life sciences)  . Smokeless tobacco: Never Used  Substance Use Topics  . Alcohol use: No  . Drug use: No     Allergies   Crab [  shellfish allergy], Erythromycin, Fish allergy, Metoclopramide, Doxycycline, Levaquin [levofloxacin in d5w], Morphine, Morphine and related, and Sulfa antibiotics   Review of Systems Review of Systems  Constitutional: Negative for fatigue and fever.  HENT: Positive for rhinorrhea. Negative for dental problem, ear pain, postnasal drip, sinus pressure, sinus pain, sneezing, sore throat, tinnitus, trouble swallowing and voice change.   Eyes: Negative for pain, redness and visual disturbance.  Respiratory: Negative for cough and shortness of breath.   Cardiovascular: Negative for chest pain and palpitations.  Gastrointestinal: Negative for abdominal pain, diarrhea and vomiting.  Genitourinary: Negative for frequency and urgency.  Musculoskeletal: Negative for arthralgias and myalgias.  Skin: Negative for rash and wound.  Neurological: Positive for headaches. Negative for dizziness, tremors, seizures, syncope, facial asymmetry, speech difficulty, weakness, light-headedness and numbness.  Psychiatric/Behavioral: Negative for agitation and confusion.     Physical Exam Triage Vital Signs ED Triage Vitals  Enc Vitals Group     BP      Pulse      Resp      Temp      Temp src      SpO2      Weight      Height      Head  Circumference      Peak Flow      Pain Score      Pain Loc      Pain Edu?      Excl. in [place]?    No data found.  Updated Vital Signs BP 132/73   Pulse 82   Temp 97.9 F (36.6 C) (Temporal)   Resp 18   SpO2 97%   Physical Exam Constitutional:      General: She is not in acute distress. HENT:     Head: Normocephalic and atraumatic.     Right Ear: Tympanic membrane and ear canal normal.     Left Ear: Tympanic membrane and ear canal normal.     Nose:     Comments: Bilateral turbinate edema with pale nasal mucosa    Mouth/Throat:     Mouth: Mucous membranes are moist.     Pharynx: Uvula midline. No oropharyngeal exudate, posterior oropharyngeal erythema or uvula swelling.  Eyes:     General: No scleral icterus.    Pupils: Pupils are equal, round, and reactive to light.  Cardiovascular:     Rate and Rhythm: Normal rate.  Pulmonary:     Effort: Pulmonary effort is normal.  Skin:    Coloration: Skin is not jaundiced or pale.  Neurological:     Mental Status: She is alert and oriented to person, place, and time.      UC Treatments / Results  Labs (all labs ordered are listed, but only abnormal results are displayed) Labs Reviewed  NOVEL CORONAVIRUS, NAA (HOSPITAL ORDER, SEND-OUT TO REF LAB)    EKG   Radiology No results found.  Procedures Procedures (including critical care time)  Medications Ordered in UC Medications  ketorolac (TORADOL) injection 60 mg (60 mg Intramuscular Given 06/23/19 1809)  dexamethasone (DECADRON) injection 10 mg (10 mg Intramuscular Given 06/23/19 1809)  ondansetron (ZOFRAN-ODT) disintegrating tablet 4 mg (4 mg Oral Given 06/23/19 1809)  dexamethasone (DECADRON) 10 MG/ML injection (has no administration in time range)  ondansetron (ZOFRAN-ODT) 4 MG disintegrating tablet (has no administration in time range)  ketorolac (TORADOL) 60 MG/2ML injection (has no administration in time range)    Initial Impression / Assessment and Plan / UC  Course  I have  reviewed the triage vital signs and the nursing notes.  Pertinent labs & imaging results that were available during my care of the patient were reviewed by me and considered in my medical decision making (see chart for details).     1.  Seasonal allergic rhinitis Recommended patient continue her daily allergy medications, increase compliance with her nasal steroid, increase water intake throughout the day.  Given family members have been sick, unsure if COVID positive, COVID testing done in office: We will call with results if positive.  2.  Tension headache No neurocognitive deficits on exam, history reassuring as well, treated with IM Toradol, steroid, p.o. Zofran which patient tolerated well.  Reporting slight improvement at time of discharge.  Return precautions discussed, patient verbalized understanding and is agreeable to plan. Final Clinical Impressions(s) / UC Diagnoses   Final diagnoses:  Seasonal allergic rhinitis, unspecified trigger  Tension headache     Discharge Instructions     Point to stay home until your covid test results come back. Continue daily allergy medication and intranasal steroid (fluticasone). Important to drink plenty of water throughout the day. Return if you develop worsening head pain, weakness, facial droop, slurred speech, confusion, chest pain, shortness of breath, profuse diarrhea, vomiting, or severe Donnell pain.    ED Prescriptions    None     Controlled Substance Prescriptions [place] Controlled Substance Registry consulted? Not Applicable   Quincy Sheehan, [place] 06/24/19 1419

## 2019-06-23 NOTE — Discharge Instructions (Addendum)
Point to stay home until your covid test results come back. Continue daily allergy medication and intranasal steroid (fluticasone). Important to drink plenty of water throughout the day. Return if you develop worsening head pain, weakness, facial droop, slurred speech, confusion, chest pain, shortness of breath, profuse diarrhea, vomiting, or severe Donnell pain.

## 2019-06-24 ENCOUNTER — Encounter (HOSPITAL_COMMUNITY): Payer: Self-pay | Admitting: Emergency Medicine

## 2019-06-24 LAB — NOVEL CORONAVIRUS, NAA (HOSP ORDER, SEND-OUT TO REF LAB; TAT 18-24 HRS): SARS-CoV-2, NAA: NOT DETECTED

## 2019-07-13 ENCOUNTER — Encounter (HOSPITAL_COMMUNITY): Payer: Self-pay

## 2019-07-13 ENCOUNTER — Other Ambulatory Visit: Payer: Self-pay

## 2019-07-13 ENCOUNTER — Ambulatory Visit (HOSPITAL_COMMUNITY)
Admission: EM | Admit: 2019-07-13 | Discharge: 2019-07-13 | Disposition: A | Discharge status: Home / Self Care | Payer: Medicare HMO | Attending: Family Medicine | Admitting: Family Medicine

## 2019-07-13 DIAGNOSIS — B369 Superficial mycosis, unspecified: Secondary | ICD-10-CM | POA: Diagnosis not present

## 2019-07-13 DIAGNOSIS — G43909 Migraine, unspecified, not intractable, without status migrainosus: Secondary | ICD-10-CM | POA: Diagnosis not present

## 2019-07-13 MED ORDER — ONDANSETRON 4 MG PO TBDP: 4.0000 mg | ORAL_TABLET | Freq: Three times a day (TID) | ORAL | 0 refills | Status: DC | PRN

## 2019-07-13 MED ORDER — KETOROLAC TROMETHAMINE 30 MG/ML IJ SOLN
INTRAMUSCULAR | Status: AC
  Filled 2019-07-13: qty 1

## 2019-07-13 MED ORDER — ONDANSETRON 4 MG PO TBDP
4.0000 mg | ORAL_TABLET | Freq: Once | ORAL | Status: AC
  Administered 2019-07-13: 4 mg via ORAL

## 2019-07-13 MED ORDER — KETOROLAC TROMETHAMINE 30 MG/ML IJ SOLN
30.0000 mg | Freq: Once | INTRAMUSCULAR | Status: AC
  Administered 2019-07-13: 30 mg via INTRAMUSCULAR

## 2019-07-13 MED ORDER — IBUPROFEN 600 MG PO TABS: 600.0000 mg | ORAL_TABLET | Freq: Three times a day (TID) | ORAL | 0 refills | Status: DC | PRN

## 2019-07-13 MED ORDER — NYSTATIN 100000 UNIT/GM EX CREA: TOPICAL_CREAM | CUTANEOUS | 0 refills | Status: DC

## 2019-07-13 MED ORDER — ONDANSETRON 4 MG PO TBDP
ORAL_TABLET | ORAL | Status: AC
  Filled 2019-07-13: qty 1

## 2019-07-13 NOTE — ED Provider Notes (Signed)
[facility]    CSN: [id number] Arrival date & time: 07/13/19  1652      History   Chief Complaint Chief Complaint  Patient presents with  . Rash    Underneath breast  . Migraine    HPI Ajwa Kimberley is a 63 y.o. female.   Francessca Danielsen presents with complaints of headache as well as rash under bilateral breasts. This headache has been off and on since recent visit when seen for the same thing, back in July. Treatment helped but then headache returned. Feels similar to migraines she has had in the past, has had them for years. This is lasting longer than normal for her. States she is no longer taking medications she was previously prescribed for migraines, states she would use Ketoralac at home. Some nausea. Some light and sound sensitivity. no fevers, vomiting, or URI symptoms. Did take tylenol today which hasn't helped with pain. No head injury. Occasional dizziness. No active blurred vision but does experience it at times. No falls. Pain is frontal as well as to occiput. No weakness, numbness or tingling. Also with itching rash under breasts. Has had before, states she gets it every summer, and has required a cream to help it. No pain. Hasn't tried any treatments for it. History  Of DM, gerd, dizziness, diverticulitis, dyspnea.    ROS per HPI, negative if not otherwise mentioned.      Past Medical History:  Diagnosis Date  . Arthritis   . Diabetes mellitus without complication ([place])    type II   . Diverticulitis   . Dyspnea    mild   . GERD (gastroesophageal reflux disease)   . History of bronchitis   . Hypertension   . IBS (irritable bowel syndrome)   . Palpitations   . Sleep apnea    mild but no sleep apnea   . TMJ (dislocation of temporomandibular joint)   . 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 Dizzy 12/11/2017  . Pharyngeal dysphagia 12/11/2017  . Post-nasal drip 12/11/2017  .  Referred ear pain, bilateral 12/11/2017  . Diabetes mellitus without complication ([place]) 19/41/7408  . Family history of glaucoma 06/29/2017  . Hyperopia of both eyes with astigmatism and presbyopia 06/29/2017  . Keratoconjunctivitis sicca of both eyes not specified as Sjogren's 06/29/2017  . Nuclear sclerotic cataract of both eyes 06/29/2017  . Vitreous floater, bilateral 06/29/2017  . Biceps tendinitis of right shoulder 04/05/2017  . Impingement syndrome of right shoulder 04/05/2017  . Arthritis of right acromioclavicular joint 04/05/2017  . Partial nontraumatic tear of rotator cuff, right 04/05/2017    Past Surgical History:  Procedure Laterality Date  . ABDOMINAL HYSTERECTOMY    . ESOPHAGOGASTRODUODENOSCOPY ENDOSCOPY     with esophagus being stretched twice per pt,   . EYE SURGERY    . 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 feeding tube placement and removal   2015  . NASAL SEPTUM SURGERY    . spurs removed from esophagus   2015  . TONSILLECTOMY      OB History   No obstetric history on file.      Home Medications    Prior to Admission medications   Medication Sig Start Date End Date Taking? Authorizing Provider  acetaminophen-codeine (TYLENOL #3) 300-30 MG tablet Take 1-2 tablets by mouth every 6 (six) hours as needed for moderate pain. 04/24/19  Charlesetta Shanks, MD  acyclovir (ZOVIRAX) 400 MG tablet Take 400 mg by mouth 2 (two) times daily.    [provider]  albuterol (PROVENTIL HFA;VENTOLIN HFA) 108 (90 BASE) MCG/ACT inhaler Inhale 2 puffs into the lungs every 4 (four) hours as needed for wheezing or shortness of breath.     [provider]  albuterol (PROVENTIL) (2.5 MG/3ML) 0.083% nebulizer solution Take 3 mLs (2.5 mg total) by nebulization every 4 (four) hours as needed for wheezing or shortness of breath. 03/11/19   Raylene Everts, MD  aluminum-magnesium hydroxide-simethicone (MAALOX) 200-200-20 MG/5ML SUSP Take 30 mLs by mouth 4 (four) times  daily -  before meals and at bedtime. Patient taking differently: Take 30 mLs by mouth 3 (three) times daily as needed (acid reflux).  04/21/19   Zigmund Gottron, NP  amoxicillin (AMOXIL) 875 MG tablet Take 1 tablet (875 mg total) by mouth 2 (two) times daily. 03/11/19   Raylene Everts, MD  Artificial Tear Ointment (DRY EYES OP) Apply 1-2 drops to eye daily as needed (for dry eyes).    [provider]  atorvastatin (LIPITOR) 10 MG tablet Take 10 mg by mouth daily.    [provider]  Dexlansoprazole (DEXILANT PO) Take by mouth.    [provider]  doxycycline (VIBRAMYCIN) 100 MG capsule Take 1 capsule (100 mg total) by mouth 2 (two) times daily. 04/17/19   Augusto Gamble B, NP  EPINEPHrine 0.3 mg/0.3 mL IJ SOAJ injection Inject 0.3 mg into the muscle once as needed (allergic reactions).     [provider]  esomeprazole (NEXIUM) 40 MG capsule Take 1 capsule (40 mg total) by mouth daily. 04/24/19   Charlesetta Shanks, MD  fluticasone (FLONASE) 50 MCG/ACT nasal spray Place 2 sprays into both nostrils daily. Patient taking differently: Place 2 sprays into both nostrils daily as needed for allergies.  06/08/18   Raylene Everts, MD  gabapentin (NEURONTIN) 300 MG capsule Take 300 mg by mouth 3 (three) times daily.    [provider]  guaiFENesin (MUCINEX) 600 MG 12 hr tablet Take 1 tablet (600 mg total) by mouth 2 (two) times daily. 04/24/19   Charlesetta Shanks, MD  ibuprofen (ADVIL) 600 MG tablet Take 1 tablet (600 mg total) by mouth every 8 (eight) hours as needed for headache. 07/13/19   Zigmund Gottron, NP  loratadine (CLARITIN) 10 MG tablet Take 10 mg by mouth daily.    [provider]  methylPREDNISolone (MEDROL DOSEPAK) 4 MG TBPK tablet Per pack instruction 04/24/19   Charlesetta Shanks, MD  metoprolol tartrate (LOPRESSOR) 25 MG tablet Take 25 mg by mouth 2 (two) times daily.    [provider]  montelukast (SINGULAIR) 10 MG tablet Take 10 mg by  mouth every morning.     [provider]  Multiple Vitamin (MULTIVITAMIN WITH MINERALS) TABS tablet Take 1 tablet by mouth daily.    [provider]  nystatin cream (MYCOSTATIN) Apply to affected area 2 times daily 07/13/19   Augusto Gamble B, NP  omeprazole (PRILOSEC) 40 MG capsule  05/16/19   [provider]  ondansetron (ZOFRAN-ODT) 4 MG disintegrating tablet Take 1 tablet (4 mg total) by mouth every 8 (eight) hours as needed for nausea or vomiting. 07/13/19   Zigmund Gottron, NP  pantoprazole (PROTONIX) 20 MG tablet Take 2 tablets (40 mg total) by mouth daily for 14 days. 05/11/19 05/25/19  Couture, Cortni S, PA-C  PROCTOZONE-HC 2.5 % rectal cream APPLY AA  BID FOR 14 DAYS. 05/23/19   [provider]  sitaGLIPtin-metformin (JANUMET) 50-500 MG tablet Take 1 tablet by mouth daily.    [provider]  sucralfate (CARAFATE) 1 g tablet Take 1 tablet (1 g total) by mouth 3 (three) times daily with meals for 14 days. 05/11/19 05/25/19  Couture, Cortni S, PA-C    Family History Family History  Problem Relation Age of Onset  . Diabetes Father     Social History Social History   Tobacco Use  . Smoking status: Former Research scientist (life sciences)  . Smokeless tobacco: Never Used  Substance Use Topics  . Alcohol use: No  . Drug use: No     Allergies   Crab [shellfish allergy], Erythromycin, Fish allergy, Metoclopramide, Doxycycline, Levaquin [levofloxacin in d5w], Morphine, Morphine and related, and Sulfa antibiotics   Review of Systems Review of Systems   Physical Exam Triage Vital Signs ED Triage Vitals  Enc Vitals Group     BP 07/13/19 1744 134/76     Pulse Rate 07/13/19 1744 73     Resp --      Temp 07/13/19 1744 98.9 F (37.2 C)     Temp Source 07/13/19 1744 Oral     SpO2 07/13/19 1744 100 %     Weight --      Height --      Head Circumference --      Peak Flow --      Pain Score 07/13/19 1747 9     Pain Loc --      Pain Edu? --      Excl. in [place]? --     No data found.  Updated Vital Signs BP 134/76 (BP Location: Right Arm)   Pulse 73   Temp 98.9 F (37.2 C) (Oral)   SpO2 100%   Visual Acuity Right Eye Distance:   Left Eye Distance:   Bilateral Distance:    Right Eye Near:   Left Eye Near:    Bilateral Near:     Physical Exam Constitutional:      General: She is not in acute distress.    Appearance: She is well-developed.  HENT:     Mouth/Throat:     Mouth: Mucous membranes are moist.  Eyes:     Extraocular Movements: Extraocular movements intact.     Pupils: Pupils are equal, round, and reactive to light.  Cardiovascular:     Rate and Rhythm: Normal rate.  Pulmonary:     Effort: Pulmonary effort is normal.  Skin:    General: Skin is warm and dry.     Findings: Rash present.          Comments: Red rash with defined edges which are to the skin folds of bilateral breasts; non tender, no drainage or papules/vesicles  Neurological:     General: No focal deficit present.     Mental Status: She is alert and oriented to person, place, and time.     Cranial Nerves: No cranial nerve deficit.     Sensory: No sensory deficit.     Motor: No weakness.     Coordination: Coordination normal.     Gait: Gait normal.  Psychiatric:        Mood and Affect: Mood normal.        Behavior: Behavior normal.      UC Treatments / Results  Labs (all labs ordered are listed, but only abnormal results are displayed) Labs Reviewed - No data to display  EKG  Radiology No results found.  Procedures Procedures (including critical care time)  Medications Ordered in UC Medications  ketorolac (TORADOL) 30 MG/ML injection 30 mg (30 mg Intramuscular Given 07/13/19 1817)  ondansetron (ZOFRAN-ODT) disintegrating tablet 4 mg (4 mg Oral Given 07/13/19 1817)  ketorolac (TORADOL) 30 MG/ML injection (has no administration in time range)  ondansetron (ZOFRAN-ODT) 4 MG disintegrating tablet (has no administration in time range)    Initial  Impression / Assessment and Plan / UC Course  I have reviewed the triage vital signs and the nursing notes.  Pertinent labs & imaging results that were available during my care of the patient were reviewed by me and considered in my medical decision making (see chart for details).     toradol and zofran provided here today. Headache treatment and prevention discussed. No red flag findings here today. Nystatin for fungal rash under breasts. Encouraged PCP follow up for migraine recheck and management. Return precautions provided. Patient verbalized understanding and agreeable to plan. Ambulatory out of clinic without difficulty.    Final Clinical Impressions(s) / UC Diagnoses   Final diagnoses:  Migraine without status migrainosus, not intractable, unspecified migraine type  Fungal skin infection     Discharge Instructions     Please follow up with your primary care provider for any persistent symptoms.    ED Prescriptions    Medication Sig Dispense Auth. Provider   nystatin cream (MYCOSTATIN) Apply to affected area 2 times daily 30 g Augusto Gamble B, NP   ibuprofen (ADVIL) 600 MG tablet Take 1 tablet (600 mg total) by mouth every 8 (eight) hours as needed for headache. 30 tablet Brydan Downard, Lanelle Bal B, NP   ondansetron (ZOFRAN-ODT) 4 MG disintegrating tablet Take 1 tablet (4 mg total) by mouth every 8 (eight) hours as needed for nausea or vomiting. 12 tablet Zigmund Gottron, NP     Controlled Substance Prescriptions [place] Controlled Substance Registry consulted? Not Applicable   Zigmund Gottron, NP 07/13/19 2138

## 2019-07-13 NOTE — ED Triage Notes (Signed)
Pt present rash and headache. Pt states the rash is underneath her breast with some itching. Pt states she is having a headache that will not go away and cause some blurred vision.

## 2019-07-13 NOTE — Discharge Instructions (Signed)
Please follow up with your primary care provider for any persistent symptoms.

## 2019-07-25 IMAGING — DX CHEST - 2 VIEW
2 series · 2 of 2 positions shown · non-contrast
Comparison: Radiographs October 06, 2018.

CLINICAL DATA: Cough.

EXAM:
CHEST - 2 VIEW

[chest pa]
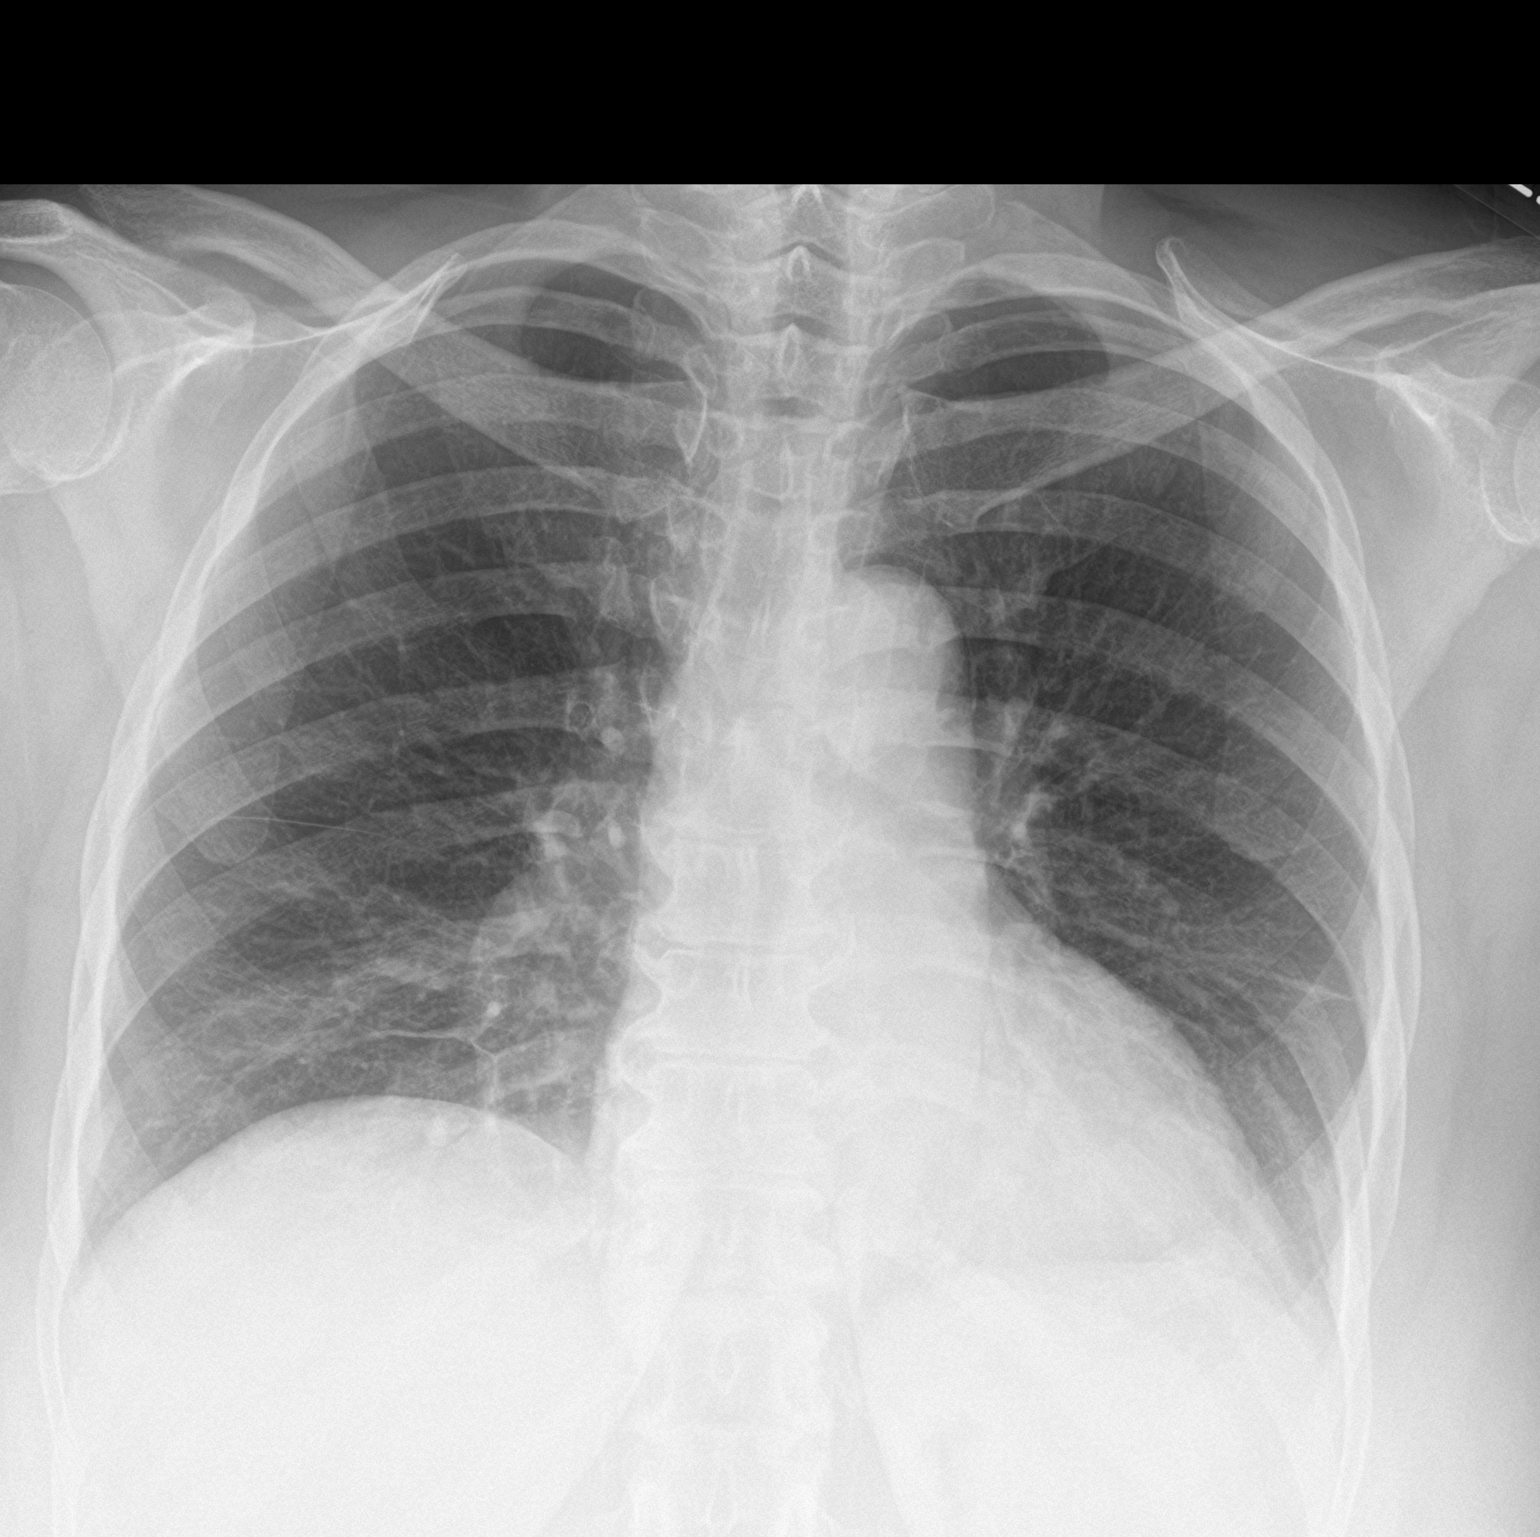

[chest lat]
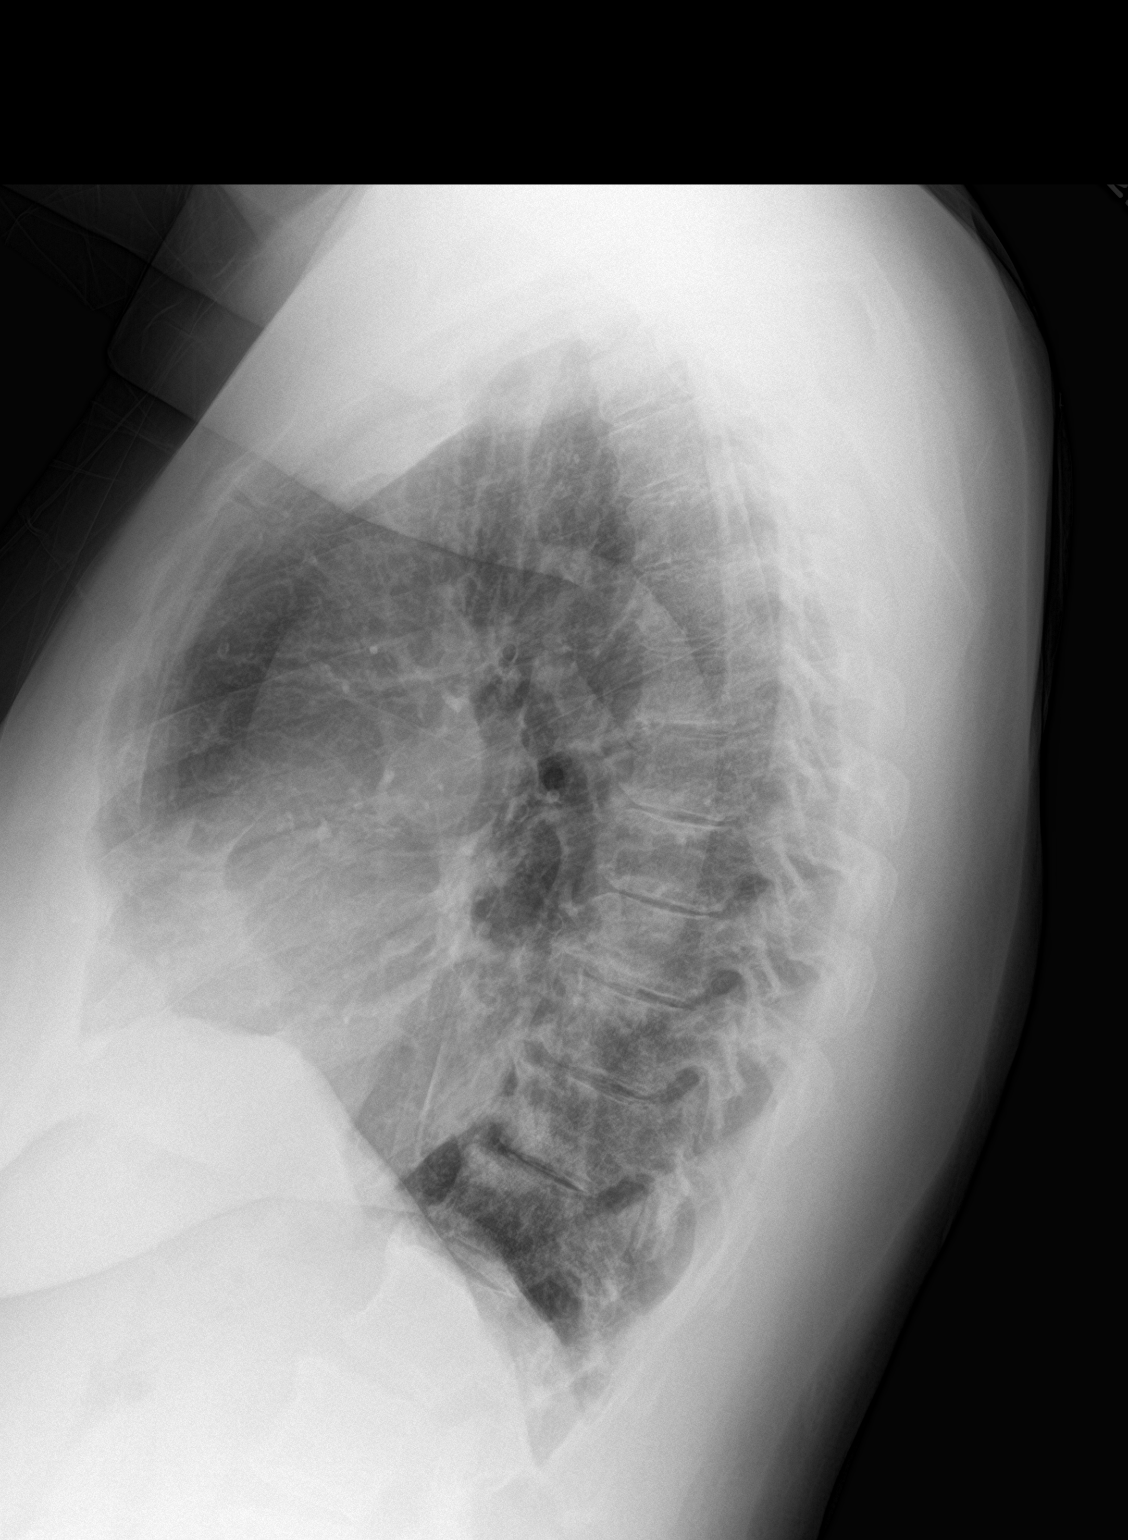

[2 of 2 positions shown; findings below may reference images not displayed]

FINDINGS: The heart size and mediastinal contours are within normal limits. No
pneumothorax or pleural effusion is noted. Left lung is clear.
Minimal right basilar subsegmental atelectasis or infiltrate is
noted. The visualized skeletal structures are unremarkable.
IMPRESSION: Minimal right basilar subsegmental atelectasis or infiltrate.
Followup PA and lateral chest X-ray is recommended in 3-4 weeks
following trial of antibiotic therapy to ensure resolution and
exclude underlying malignancy.

## 2019-08-17 ENCOUNTER — Emergency Department (HOSPITAL_COMMUNITY)
Admission: EM | Admit: 2019-08-17 | Discharge: 2019-08-17 | Discharge status: Against Medical Advice | Payer: Medicare HMO | Attending: Emergency Medicine | Admitting: Emergency Medicine

## 2019-08-17 ENCOUNTER — Encounter (HOSPITAL_COMMUNITY): Payer: Self-pay | Admitting: *Deleted

## 2019-08-17 ENCOUNTER — Other Ambulatory Visit: Payer: Self-pay

## 2019-08-17 ENCOUNTER — Emergency Department (HOSPITAL_COMMUNITY): Payer: Medicare HMO

## 2019-08-17 DIAGNOSIS — I10 Essential (primary) hypertension: Secondary | ICD-10-CM | POA: Diagnosis not present

## 2019-08-17 DIAGNOSIS — Z888 Allergy status to other drugs, medicaments and biological substances status: Secondary | ICD-10-CM | POA: Diagnosis not present

## 2019-08-17 DIAGNOSIS — Z882 Allergy status to sulfonamides status: Secondary | ICD-10-CM | POA: Insufficient documentation

## 2019-08-17 DIAGNOSIS — R110 Nausea: Secondary | ICD-10-CM

## 2019-08-17 DIAGNOSIS — Z881 Allergy status to other antibiotic agents status: Secondary | ICD-10-CM | POA: Insufficient documentation

## 2019-08-17 DIAGNOSIS — E119 Type 2 diabetes mellitus without complications: Secondary | ICD-10-CM | POA: Diagnosis not present

## 2019-08-17 DIAGNOSIS — Z885 Allergy status to narcotic agent status: Secondary | ICD-10-CM | POA: Insufficient documentation

## 2019-08-17 DIAGNOSIS — Z91013 Allergy to seafood: Secondary | ICD-10-CM | POA: Insufficient documentation

## 2019-08-17 DIAGNOSIS — R002 Palpitations: Secondary | ICD-10-CM | POA: Diagnosis not present

## 2019-08-17 DIAGNOSIS — Z7984 Long term (current) use of oral hypoglycemic drugs: Secondary | ICD-10-CM | POA: Diagnosis not present

## 2019-08-17 DIAGNOSIS — R531 Weakness: Secondary | ICD-10-CM | POA: Diagnosis present

## 2019-08-17 DIAGNOSIS — R42 Dizziness and giddiness: Secondary | ICD-10-CM | POA: Insufficient documentation

## 2019-08-17 LAB — CBC
HCT: 42.6 % (ref 36.0–46.0)
Hemoglobin: 14 g/dL (ref 12.0–15.0)
MCH: 29.5 pg (ref 26.0–34.0)
MCHC: 32.9 g/dL (ref 30.0–36.0)
MCV: 89.9 fL (ref 80.0–100.0)
Platelets: 236 10*3/uL (ref 150–400)
RBC: 4.74 MIL/uL (ref 3.87–5.11)
RDW: 12.7 % (ref 11.5–15.5)
WBC: 5.6 10*3/uL (ref 4.0–10.5)
nRBC: 0 % (ref 0.0–0.2)

## 2019-08-17 LAB — BASIC METABOLIC PANEL
Anion gap: 9 (ref 5–15)
BUN: 11 mg/dL (ref 8–23)
CO2: 24 mmol/L (ref 22–32)
Calcium: 9.3 mg/dL (ref 8.9–10.3)
Chloride: 103 mmol/L (ref 98–111)
Creatinine, Ser: 0.89 mg/dL (ref 0.44–1.00)
GFR calc Af Amer: >60 mL/min (ref >60)
GFR calc non Af Amer: >60 mL/min (ref >60)
Glucose, Bld: 137 mg/dL — ABNORMAL HIGH (ref 70–99)
Potassium: 4.4 mmol/L (ref 3.5–5.1)
Sodium: 136 mmol/L (ref 135–145)

## 2019-08-17 LAB — TROPONIN I (HIGH SENSITIVITY)
Troponin I (High Sensitivity): 3 ng/L (ref <18)
Troponin I (High Sensitivity): <2 ng/L (ref <18)

## 2019-08-17 MED ORDER — SODIUM CHLORIDE 0.9% FLUSH: 3.0000 mL | Freq: Once | INTRAVENOUS | Status: DC

## 2019-08-17 NOTE — ED Provider Notes (Signed)
[facility] EMERGENCY DEPARTMENT Provider Note   CSN: [id number] Arrival date & time: 08/17/19  0255     History   Chief Complaint Chief Complaint  Patient presents with  . Weakness    HPI Pamela Roberson is a 63 y.o. female.     63yo F w/ PMH including T2DM, IBS, HTN, GERD who p/w multiple complaints. 2 days ago, pt had an outpatient endoscopy during which she had esophageal dilation. She was diagnosed with gastritis and instructed to continue home medications. Yesterday evening, she began feeling "hyped up" and restless with palpitations and dizziness. She states it felt like she had consumed an energy drink or a lot of caffeine but denies any caffeine intake besides coffee in the morning. She was having nausea, mild shortness of breath, and couldn't sleep. She denies feeling anxious. Eventually, in the middle of the night she decided to come to the ED for evaluation. She took zofran in the waiting room. Eventually her symptoms subsided in the waiting room. She reports symptoms have resolved currently. She denies any preceding illness such as vomiting/diarrhea or cold symptoms. She denies any recent changes to medications.   The history is provided by the patient.  Weakness   Past Medical History:  Diagnosis Date  . Arthritis   . Diabetes mellitus without complication ([place])    type II   . Diverticulitis   . Dyspnea    mild   . GERD (gastroesophageal reflux disease)   . History of bronchitis   . Hypertension   . IBS (irritable bowel syndrome)   . Palpitations   . Sleep apnea    mild but no sleep apnea   . TMJ (dislocation of temporomandibular joint)   . 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 Dizzy 12/11/2017  . Pharyngeal dysphagia 12/11/2017  . Post-nasal drip 12/11/2017  . Referred ear pain, bilateral 12/11/2017  . Diabetes mellitus without complication ([place]) [id number]  . Family  history of glaucoma 06/29/2017  . Hyperopia of both eyes with astigmatism and presbyopia 06/29/2017  . Keratoconjunctivitis sicca of both eyes not specified as Sjogren's 06/29/2017  . Nuclear sclerotic cataract of both eyes 06/29/2017  . Vitreous floater, bilateral 06/29/2017  . Biceps tendinitis of right shoulder 04/05/2017  . Impingement syndrome of right shoulder 04/05/2017  . Arthritis of right acromioclavicular joint 04/05/2017  . Partial nontraumatic tear of rotator cuff, right 04/05/2017    Past Surgical History:  Procedure Laterality Date  . ABDOMINAL HYSTERECTOMY    . ESOPHAGOGASTRODUODENOSCOPY ENDOSCOPY     with esophagus being stretched twice per pt,   . EYE SURGERY    . 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 feeding tube placement and removal   2015  . NASAL SEPTUM SURGERY    . spurs removed from esophagus   2015  . TONSILLECTOMY       OB History   No obstetric history on file.      Home Medications    Prior to Admission medications   Medication Sig Start Date End Date Taking? Authorizing Provider  acetaminophen-codeine (TYLENOL #3) 300-30 MG tablet Take 1-2 tablets by mouth every 6 (six) hours as needed for moderate pain. 04/24/19   Charlesetta Shanks, MD  acyclovir (ZOVIRAX) 400 MG tablet Take 400 mg by mouth 2 (two) times daily.    [provider]  albuterol (PROVENTIL HFA;VENTOLIN HFA)  108 (90 BASE) MCG/ACT inhaler Inhale 2 puffs into the lungs every 4 (four) hours as needed for wheezing or shortness of breath.     [provider]  albuterol (PROVENTIL) (2.5 MG/3ML) 0.083% nebulizer solution Take 3 mLs (2.5 mg total) by nebulization every 4 (four) hours as needed for wheezing or shortness of breath. 03/11/19   Raylene Everts, MD  aluminum-magnesium hydroxide-simethicone (MAALOX) 200-200-20 MG/5ML SUSP Take 30 mLs by mouth 4 (four) times daily -  before meals and at bedtime. Patient taking differently: Take 30 mLs by mouth 3 (three) times daily  as needed (acid reflux).  04/21/19   Zigmund Gottron, NP  amoxicillin (AMOXIL) 875 MG tablet Take 1 tablet (875 mg total) by mouth 2 (two) times daily. 03/11/19   Raylene Everts, MD  Artificial Tear Ointment (DRY EYES OP) Apply 1-2 drops to eye daily as needed (for dry eyes).    [provider]  atorvastatin (LIPITOR) 10 MG tablet Take 10 mg by mouth daily.    [provider]  Dexlansoprazole (DEXILANT PO) Take by mouth.    [provider]  doxycycline (VIBRAMYCIN) 100 MG capsule Take 1 capsule (100 mg total) by mouth 2 (two) times daily. 04/17/19   Augusto Gamble B, NP  EPINEPHrine 0.3 mg/0.3 mL IJ SOAJ injection Inject 0.3 mg into the muscle once as needed (allergic reactions).     [provider]  esomeprazole (NEXIUM) 40 MG capsule Take 1 capsule (40 mg total) by mouth daily. 04/24/19   Charlesetta Shanks, MD  fluticasone (FLONASE) 50 MCG/ACT nasal spray Place 2 sprays into both nostrils daily. Patient taking differently: Place 2 sprays into both nostrils daily as needed for allergies.  06/08/18   Raylene Everts, MD  gabapentin (NEURONTIN) 300 MG capsule Take 300 mg by mouth 3 (three) times daily.    [provider]  guaiFENesin (MUCINEX) 600 MG 12 hr tablet Take 1 tablet (600 mg total) by mouth 2 (two) times daily. 04/24/19   Charlesetta Shanks, MD  ibuprofen (ADVIL) 600 MG tablet Take 1 tablet (600 mg total) by mouth every 8 (eight) hours as needed for headache. 07/13/19   Zigmund Gottron, NP  loratadine (CLARITIN) 10 MG tablet Take 10 mg by mouth daily.    [provider]  methylPREDNISolone (MEDROL DOSEPAK) 4 MG TBPK tablet Per pack instruction 04/24/19   Charlesetta Shanks, MD  metoprolol tartrate (LOPRESSOR) 25 MG tablet Take 25 mg by mouth 2 (two) times daily.    [provider]  montelukast (SINGULAIR) 10 MG tablet Take 10 mg by mouth every morning.     [provider]  Multiple Vitamin (MULTIVITAMIN WITH MINERALS) TABS tablet  Take 1 tablet by mouth daily.    [provider]  nystatin cream (MYCOSTATIN) Apply to affected area 2 times daily 07/13/19   Augusto Gamble B, NP  omeprazole (PRILOSEC) 40 MG capsule  05/16/19   [provider]  ondansetron (ZOFRAN-ODT) 4 MG disintegrating tablet Take 1 tablet (4 mg total) by mouth every 8 (eight) hours as needed for nausea or vomiting. 07/13/19   Zigmund Gottron, NP  pantoprazole (PROTONIX) 20 MG tablet Take 2 tablets (40 mg total) by mouth daily for 14 days. 05/11/19 05/25/19  Couture, Cortni S, PA-C  PROCTOZONE-HC 2.5 % rectal cream APPLY AA BID FOR 14 DAYS. 05/23/19   [provider]  sitaGLIPtin-metformin (JANUMET) 50-500 MG tablet Take 1 tablet by mouth daily.    [provider]  sucralfate (CARAFATE) 1 g tablet Take 1 tablet (1 g total) by mouth 3 (three) times daily with meals for 14 days. 05/11/19 05/25/19  Couture, Cortni S, PA-C    Family History Family History  Problem Relation Age of Onset  . Diabetes Father     Social History Social History   Tobacco Use  . Smoking status: Former Research scientist (life sciences)  . Smokeless tobacco: Never Used  Substance Use Topics  . Alcohol use: No  . Drug use: No     Allergies   Crab [shellfish allergy], Erythromycin, Fish allergy, Metoclopramide, Doxycycline, Levaquin [levofloxacin in d5w], Morphine, Morphine and related, and Sulfa antibiotics   Review of Systems Review of Systems  Neurological: Positive for weakness.   All other systems reviewed and are negative except that which was mentioned in HPI   Physical Exam Updated Vital Signs BP 118/67   Pulse 64   Temp 98.3 F (36.8 C) (Oral)   Resp 15   Ht 5\' 7"  (1.702 m)   Wt 88.9 kg   SpO2 100%   BMI 30.70 kg/m   Physical Exam Vitals signs and nursing note reviewed.  Constitutional:      General: She is not in acute distress.    Appearance: She is well-developed.  HENT:     Head: Normocephalic and atraumatic.  Eyes:     Conjunctiva/sclera:  Conjunctivae normal.     Pupils: Pupils are equal, round, and reactive to light.  Neck:     Musculoskeletal: Neck supple.  Cardiovascular:     Rate and Rhythm: Normal rate and regular rhythm.     Heart sounds: Normal heart sounds. No murmur.  Pulmonary:     Effort: Pulmonary effort is normal.     Breath sounds: Normal breath sounds.  Abdominal:     General: Bowel sounds are normal. There is no distension.     Palpations: Abdomen is soft.     Tenderness: There is abdominal tenderness (midepigastric).  Skin:    General: Skin is warm and dry.  Neurological:     Mental Status: She is alert and oriented to person, place, and time.     Comments: Fluent speech  Psychiatric:     Comments: Mildly anxious      ED Treatments / Results  Labs (all labs ordered are listed, but only abnormal results are displayed) Labs Reviewed  BASIC METABOLIC PANEL - Abnormal; Notable for the following components:      Result Value   Glucose, Bld 137 (*)    All other components within normal limits  CBC  TROPONIN I (HIGH SENSITIVITY)  TROPONIN I (HIGH SENSITIVITY)    EKG EKG Interpretation  Date/Time:  Saturday August 17 2019 03:07:33 EDT Ventricular Rate:  80 PR Interval:  160 QRS Duration: 82 QT Interval:  376 QTC Calculation: 433 R Axis:   -56 Text Interpretation:  Normal sinus rhythm Left anterior fascicular block Possible Anterior infarct , age undetermined Abnormal ECG No significant change since last tracing Confirmed by Theotis Burrow [phone]) on 08/17/2019 7:26:43 AM   Radiology Dg Chest 2 View  Result Date: 08/17/2019 CLINICAL DATA:  Chest pain EXAM: CHEST - 2 VIEW COMPARISON:  04/24/2019 FINDINGS: The heart size and mediastinal contours are within normal limits. Both lungs are clear. The visualized skeletal structures are unremarkable. IMPRESSION: No active cardiopulmonary disease. Electronically Signed   By: Ulyses Jarred M.D.   On: 08/17/2019 03:58    Procedures Procedures  (including critical care time)  Medications Ordered in ED  Medications  sodium chloride flush (NS) 0.9 % injection 3 mL (has no administration in time range)     Initial Impression / Assessment and Plan / ED Course  I have reviewed the triage vital signs and the nursing notes.  Pertinent labs & imaging results that were available during my care of the patient were reviewed by me and considered in my medical decision making (see chart for details).       Comfortable on exam, normal VS. EKG unremarkable. CXR clear. Considered iatrogenic esophageal rupture however given that she is approximately 36 hours from procedure and has normal chest x-ray and normal vital signs with resolution of symptoms, I feel this is extremely unlikely.  Her description is very atypical for ACS or PE. Labs show reassuring CBC, BMP. Negative initial trop. Second trop was pulled and when I evaluated pt I explained current results and that we were awaiting 2nd trop.  I was later contacted by nursing staff that patient had pulled out her own IV and gotten dressed and wanted to leave. I went to discuss w/ patient and she had eloped out of the ED prior to discharge or receipt of paperwork.  Final Clinical Impressions(s) / ED Diagnoses   Final diagnoses:  Palpitations  Dizziness  Nausea    ED Discharge Orders    None       Damiano Stamper, Wenda Overland, MD 08/17/19 [phone]

## 2019-08-17 NOTE — ED Notes (Signed)
No answer going to room

## 2019-08-17 NOTE — ED Notes (Addendum)
Walked into pt room, pt was pulling off monitor and taking out IV. Pt states "I can't stay any longer, I have to take my daughter the car. You guys can mail me the results."  Explained to pt that she should stay to let the MD speak to her.

## 2019-08-17 NOTE — ED Triage Notes (Signed)
The pt had her esophagus stretched yesterday

## 2019-08-17 NOTE — ED Notes (Signed)
MD made aware and pt seen walking out into lobby

## 2019-08-17 NOTE — ED Notes (Addendum)
Called pt for room no answer

## 2019-08-17 NOTE — ED Triage Notes (Signed)
The pt had an endoscopy yesterday  And since then she has been feeling very stgrange with some chest discomfort

## 2019-08-19 ENCOUNTER — Other Ambulatory Visit: Payer: Self-pay | Admitting: Family Medicine

## 2019-08-19 DIAGNOSIS — Z1231 Encounter for screening mammogram for malignant neoplasm of breast: Secondary | ICD-10-CM

## 2019-09-07 ENCOUNTER — Other Ambulatory Visit: Payer: Self-pay

## 2019-09-07 ENCOUNTER — Ambulatory Visit (HOSPITAL_COMMUNITY)
Admission: EM | Admit: 2019-09-07 | Discharge: 2019-09-07 | Disposition: A | Discharge status: Home / Self Care | Payer: Medicare HMO | Attending: Family Medicine | Admitting: Family Medicine

## 2019-09-07 ENCOUNTER — Encounter (HOSPITAL_COMMUNITY): Payer: Self-pay | Admitting: Emergency Medicine

## 2019-09-07 DIAGNOSIS — H1031 Unspecified acute conjunctivitis, right eye: Secondary | ICD-10-CM | POA: Insufficient documentation

## 2019-09-07 DIAGNOSIS — Z20828 Contact with and (suspected) exposure to other viral communicable diseases: Secondary | ICD-10-CM | POA: Insufficient documentation

## 2019-09-07 DIAGNOSIS — J309 Allergic rhinitis, unspecified: Secondary | ICD-10-CM | POA: Insufficient documentation

## 2019-09-07 DIAGNOSIS — J029 Acute pharyngitis, unspecified: Secondary | ICD-10-CM | POA: Insufficient documentation

## 2019-09-07 DIAGNOSIS — I10 Essential (primary) hypertension: Secondary | ICD-10-CM | POA: Insufficient documentation

## 2019-09-07 DIAGNOSIS — E119 Type 2 diabetes mellitus without complications: Secondary | ICD-10-CM | POA: Insufficient documentation

## 2019-09-07 MED ORDER — POLYMYXIN B-TRIMETHOPRIM 10000-0.1 UNIT/ML-% OP SOLN: 1.0000 [drp] | OPHTHALMIC | 0 refills | Status: DC

## 2019-09-07 MED ORDER — FEXOFENADINE HCL 180 MG PO TABS: 180.0000 mg | ORAL_TABLET | Freq: Every day | ORAL | 1 refills | Status: DC

## 2019-09-07 NOTE — ED Provider Notes (Signed)
  [place]    CSN: [id number] Arrival date & time: 09/07/19  1721  Chief Complaint  Patient presents with  . Sore Throat  . Generalized Body Aches    Adabella Griner here for URI complaints.  Duration: 5 days  Associated symptoms: sinus congestion, rhinorrhea, itchy watery eyes, red eyes, sore throat and myalgia Denies: sinus pain, rhinorrhea, ear pain, ear drainage, wheezing, shortness of breath and fevers Treatment to date: Allergy medication, regular asthma medication Sick contacts: Yes-daughter tested positive for influenza A  ROS:  Const: Denies fevers HEENT: As noted in HPI Lungs: No SOB  Past Medical History:  Diagnosis Date  . Arthritis   . Diabetes mellitus without complication ([place])    type II   . Diverticulitis   . Dyspnea    mild   . GERD (gastroesophageal reflux disease)   . History of bronchitis   . Hypertension   . IBS (irritable bowel syndrome)   . Palpitations   . Sleep apnea    mild but no sleep apnea   . TMJ (dislocation of temporomandibular joint)   . 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     BP 128/85 (BP Location: Right Arm)   Pulse 86   Temp 98.6 F (37 C) (Oral)   Resp 18   SpO2 96%  General: Awake, alert, appears stated age HEENT: AT, [place], ears patent b/l and TM's neg, nares patent w clear discharge, pharynx pink and without exudates, MMM; right eye with some crusted discharge and scleral injection, left eye is unremarkable Neck: No masses or asymmetry Heart: RRR Lungs: CTAB, no accessory muscle use Psych: Age appropriate judgment and insight, normal mood and affect   Final Clinical Impressions(s) / UC Diagnoses   Final diagnoses:  Acute conjunctivitis of right eye, unspecified acute conjunctivitis type  Allergic rhinitis, unspecified seasonality, unspecified trigger   Will treat for allergies.  Given unilateral presentation of eye, will give antibiotic drops.  Even if she did have the flu, she is outside the  window where Tamiflu would be effective.   Discharge Instructions     Stay on your medicines.   Follow up with your PCP this week if still having issues.  Continue to push fluids, practice good hand hygiene, and cover your mouth if you cough.  If you start having fevers, shaking or shortness of breath, seek immediate care.      ED Prescriptions    Medication Sig Dispense Auth. Provider   fexofenadine (ALLEGRA) 180 MG tablet Take 1 tablet (180 mg total) by mouth daily. 30 tablet Shelda Pal, DO   trimethoprim-polymyxin b (POLYTRIM) ophthalmic solution Place 1 drop into the right eye every 4 (four) hours. 10 mL Shelda Pal, DO        Nani Ravens [place], [place] 09/07/19 1902

## 2019-09-07 NOTE — ED Triage Notes (Signed)
Pt here for sore throat and body aches with nasal congestion; pt sts her daughter tested positive for flu type A

## 2019-09-07 NOTE — ED Notes (Signed)
Strep throat completed. Results-Negative

## 2019-09-07 NOTE — Discharge Instructions (Signed)
Stay on your medicines.   Follow up with your PCP this week if still having issues.  Continue to push fluids, practice good hand hygiene, and cover your mouth if you cough.  If you start having fevers, shaking or shortness of breath, seek immediate care.

## 2019-09-09 ENCOUNTER — Encounter (HOSPITAL_COMMUNITY): Payer: Self-pay

## 2019-09-09 LAB — NOVEL CORONAVIRUS, NAA (HOSP ORDER, SEND-OUT TO REF LAB; TAT 18-24 HRS): SARS-CoV-2, NAA: NOT DETECTED

## 2019-09-09 LAB — POCT RAPID STREP A: Streptococcus, Group A Screen (Direct): NEGATIVE

## 2019-09-10 LAB — CULTURE, GROUP A STREP (THRC)

## 2019-09-12 ENCOUNTER — Other Ambulatory Visit: Payer: Self-pay

## 2019-09-12 ENCOUNTER — Encounter (HOSPITAL_COMMUNITY): Payer: Self-pay

## 2019-09-12 ENCOUNTER — Ambulatory Visit (HOSPITAL_COMMUNITY)
Admission: EM | Admit: 2019-09-12 | Discharge: 2019-09-12 | Disposition: A | Discharge status: Home / Self Care | Payer: Medicare HMO | Attending: Emergency Medicine | Admitting: Emergency Medicine

## 2019-09-12 DIAGNOSIS — M79605 Pain in left leg: Secondary | ICD-10-CM | POA: Diagnosis not present

## 2019-09-12 DIAGNOSIS — M5442 Lumbago with sciatica, left side: Secondary | ICD-10-CM

## 2019-09-12 DIAGNOSIS — G8929 Other chronic pain: Secondary | ICD-10-CM | POA: Diagnosis not present

## 2019-09-12 DIAGNOSIS — M5432 Sciatica, left side: Secondary | ICD-10-CM

## 2019-09-12 MED ORDER — PREDNISONE 10 MG PO TABS: 10.0000 mg | ORAL_TABLET | Freq: Every day | ORAL | 0 refills | Status: DC

## 2019-09-12 MED ORDER — METHYLPREDNISOLONE SODIUM SUCC 125 MG IJ SOLR
INTRAMUSCULAR | Status: AC
  Filled 2019-09-12: qty 2

## 2019-09-12 MED ORDER — KETOROLAC TROMETHAMINE 30 MG/ML IJ SOLN
30.0000 mg | Freq: Once | INTRAMUSCULAR | Status: AC
  Administered 2019-09-12: 30 mg via INTRAMUSCULAR

## 2019-09-12 MED ORDER — KETOROLAC TROMETHAMINE 30 MG/ML IJ SOLN
INTRAMUSCULAR | Status: AC
  Filled 2019-09-12: qty 1

## 2019-09-12 MED ORDER — METHYLPREDNISOLONE SODIUM SUCC 125 MG IJ SOLR
80.0000 mg | Freq: Once | INTRAMUSCULAR | Status: AC
  Administered 2019-09-12: 80 mg via INTRAMUSCULAR

## 2019-09-12 NOTE — ED Provider Notes (Signed)
[facility]    CSN: [id number] Arrival date & time: 09/12/19  1841      History   Chief Complaint Chief Complaint  Patient presents with   Leg Pain   Knee Injury    HPI Pamela Roberson is a 63 y.o. female.   Pt has chronic back pain, sees ortho and was told that she has DDD and would need steroid injections for surgery. PT has not had these yet. She has been having pain to lower back down LT leg intermit for weeks now and wants something to help. Denies any urine incont, able to walk in triage and room, has taken tylenol at home with no relief.      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     Patient Active Problem List   Diagnosis Date Noted   Dizzy 12/11/2017   Pharyngeal dysphagia 12/11/2017   Post-nasal drip 12/11/2017   Referred ear pain, bilateral 12/11/2017   Diabetes mellitus without complication ([place]) [id number]   Family history of glaucoma 06/29/2017   Hyperopia of both eyes with astigmatism and presbyopia 06/29/2017   Keratoconjunctivitis sicca of both eyes not specified as Sjogren's 06/29/2017   Nuclear sclerotic cataract of both eyes 06/29/2017   Vitreous floater, bilateral 06/29/2017   Biceps tendinitis of right shoulder 04/05/2017   Impingement syndrome of right shoulder 04/05/2017   Arthritis of right acromioclavicular joint 04/05/2017   Partial nontraumatic tear of rotator cuff, right 04/05/2017    Past Surgical History:  Procedure Laterality Date   ABDOMINAL HYSTERECTOMY     ESOPHAGOGASTRODUODENOSCOPY ENDOSCOPY     with  esophagus being stretched twice per pt,    EYE SURGERY     fatty 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      feeding tube placement and removal   2015   NASAL SEPTUM SURGERY     spurs removed from esophagus   2015   TONSILLECTOMY      OB History   No obstetric history on file.      Home Medications    Prior to Admission medications   Medication Sig Start Date End Date Taking? Authorizing Provider  acyclovir (ZOVIRAX) 400 MG tablet Take 400 mg by mouth 2 (two) times daily.    [provider]  albuterol (PROVENTIL HFA;VENTOLIN HFA) 108 (90 BASE) MCG/ACT inhaler Inhale 2 puffs into the lungs every 4 (four) hours as needed for wheezing or shortness of breath.     [provider]  albuterol (PROVENTIL) (2.5 MG/3ML) 0.083% nebulizer solution Take 3 mLs (2.5 mg total) by nebulization every 4 (four) hours as needed for wheezing or shortness of breath. 03/11/19   Raylene Everts, MD  aluminum-magnesium hydroxide-simethicone (MAALOX) 200-200-20 MG/5ML SUSP Take 30 mLs by mouth 4 (four) times daily -  before meals and at bedtime. Patient taking differently: Take 30 mLs by mouth 3 (three) times daily as needed (acid reflux).  04/21/19   Zigmund Gottron, NP  Artificial Tear Ointment (DRY  EYES OP) Apply 1-2 drops to eye daily as needed (for dry eyes).    [provider]  atorvastatin (LIPITOR) 10 MG tablet Take 10 mg by mouth daily.    [provider]  Dexlansoprazole (DEXILANT PO) Take by mouth.    [provider]  EPINEPHrine 0.3 mg/0.3 mL IJ SOAJ injection Inject 0.3 mg into the muscle once as needed (allergic reactions).     [provider]  esomeprazole (NEXIUM) 40 MG capsule Take 1 capsule (40 mg total) by mouth daily. 04/24/19   Charlesetta Shanks, MD  fexofenadine (ALLEGRA) 180 MG tablet Take 1 tablet (180 mg total) by mouth daily. 09/07/19   Shelda Pal, DO  gabapentin (NEURONTIN) 300 MG capsule Take 300 mg by mouth 3 (three) times  daily.    [provider]  guaiFENesin (MUCINEX) 600 MG 12 hr tablet Take 1 tablet (600 mg total) by mouth 2 (two) times daily. 04/24/19   Charlesetta Shanks, MD  ibuprofen (ADVIL) 600 MG tablet Take 1 tablet (600 mg total) by mouth every 8 (eight) hours as needed for headache. 07/13/19   Zigmund Gottron, NP  Lancets (ONETOUCH DELICA PLUS [id number]) [place]  08/15/19   [provider]  loratadine (CLARITIN) 10 MG tablet Take 10 mg by mouth daily.    [provider]  metoprolol tartrate (LOPRESSOR) 25 MG tablet Take 25 mg by mouth 2 (two) times daily.    [provider]  montelukast (SINGULAIR) 10 MG tablet Take 10 mg by mouth every morning.     [provider]  Multiple Vitamin (MULTIVITAMIN WITH MINERALS) TABS tablet Take 1 tablet by mouth daily.    [provider]  nystatin cream (MYCOSTATIN) Apply to affected area 2 times daily 07/13/19   Augusto Gamble B, NP  omeprazole (PRILOSEC) 40 MG capsule  05/16/19   [provider]  ondansetron (ZOFRAN-ODT) 4 MG disintegrating tablet Take 1 tablet (4 mg total) by mouth every 8 (eight) hours as needed for nausea or vomiting. 07/13/19   Zigmund Gottron, NP  pantoprazole (PROTONIX) 20 MG tablet Take 2 tablets (40 mg total) by mouth daily for 14 days. 05/11/19 05/25/19  Couture, Cortni S, PA-C  predniSONE (DELTASONE) 10 MG tablet Take 1 tablet (10 mg total) by mouth daily. 09/12/19   Marney Setting, NP  PROCTOZONE-HC 2.5 % rectal cream APPLY AA BID FOR 14 DAYS. 05/23/19   [provider]  sitaGLIPtin-metformin (JANUMET) 50-500 MG tablet Take 1 tablet by mouth daily.    [provider]  sucralfate (CARAFATE) 1 g tablet Take 1 tablet (1 g total) by mouth 3 (three) times daily with meals for 14 days. 05/11/19 05/25/19  Couture, Cortni S, PA-C  trimethoprim-polymyxin b (POLYTRIM) ophthalmic solution Place 1 drop into the right eye every 4 (four) hours. 09/07/19   Shelda Pal, DO    fluticasone (FLONASE) 50 MCG/ACT nasal spray Place 2 sprays into both nostrils daily. Patient taking differently: Place 2 sprays into both nostrils daily as needed for allergies.  06/08/18 09/07/19  Raylene Everts, MD    Family History Family History  Problem Relation Age of Onset   Diabetes Father     Social History Social History   Tobacco Use   Smoking status: Former Smoker   Smokeless tobacco: Never Used  Substance Use Topics   Alcohol use: No   Drug use: No     Allergies   Crab [shellfish allergy], Erythromycin, Fish allergy, Metoclopramide, Doxycycline, Levaquin [levofloxacin  in d5w], Morphine, Morphine and related, and Sulfa antibiotics   Review of Systems Review of Systems  Constitutional: Negative.   Respiratory: Negative.   Cardiovascular: Negative.   Genitourinary: Negative.   Musculoskeletal: Positive for back pain.  Skin: Negative.   Neurological:       Tingling to LT left starting in lower back      Physical Exam Triage Vital Signs ED Triage Vitals  Enc Vitals Group     BP 09/12/19 1903 127/77     Pulse Rate 09/12/19 1903 82     Resp 09/12/19 1903 16     Temp 09/12/19 1903 98.4 F (36.9 C)     Temp Source 09/12/19 1903 Temporal     SpO2 09/12/19 1903 100 %     Weight --      Height --      Head Circumference --      Peak Flow --      Pain Score 09/12/19 1859 10     Pain Loc --      Pain Edu? --      Excl. in [place]? --    No data found.  Updated Vital Signs BP 127/77 (BP Location: Right Arm)    Pulse 82    Temp 98.4 F (36.9 C) (Temporal)    Resp 16    SpO2 100%   Visual Acuity    Physical Exam Cardiovascular:     Rate and Rhythm: Normal rate.  Pulmonary:     Effort: Pulmonary effort is normal.  Abdominal:     General: Abdomen is flat.  Musculoskeletal:        General: Tenderness present.     Comments: No edema, no calf pain, able to bear wt to lt leg, able to flex and extend, strong pulses,   Skin:    General: Skin is  warm.     Capillary Refill: Capillary refill takes less than 2 seconds.  Neurological:     General: No focal deficit present.     Mental Status: She is alert.      UC Treatments / Results  Labs (all labs ordered are listed, but only abnormal results are displayed) Labs Reviewed - No data to display  EKG   Radiology No results found.  Procedures Procedures (including critical care time)  Medications Ordered in UC Medications  ketorolac (TORADOL) 30 MG/ML injection 30 mg (has no administration in time range)  methylPREDNISolone sodium succinate (SOLU-MEDROL) 125 mg/2 mL injection 80 mg (has no administration in time range)    Initial Impression / Assessment and Plan / UC Course  I have reviewed the triage vital signs and the nursing notes.  Pertinent labs & imaging results that were available during my care of the patient were reviewed by me and considered in my medical decision making (see chart for details).    Discuss that pt will need to see ortho for tx we can temp treat pain here. THe meds we give pt can elevate sugar pt states that she is ok with this.  Will need to use tynelol, heat and rest for pain  Final Clinical Impressions(s) / UC Diagnoses   Final diagnoses:  Left leg pain  Chronic midline low back pain with left-sided sciatica  Sciatica of left side     Discharge Instructions     We discussed that the medication can elevate your sugar to be aware of this and monitor. Pt will need to follow upf with her ortho for further  treatment options.     ED Prescriptions    Medication Sig Dispense Auth. Provider   predniSONE (DELTASONE) 10 MG tablet Take 1 tablet (10 mg total) by mouth daily. 15 tablet Marney Setting, NP     PDMP not reviewed this encounter.   Marney Setting, NP 09/12/19 1919

## 2019-09-12 NOTE — ED Triage Notes (Signed)
Pt report left leg pain and left buttocks pain, left knee pain x 1 year on and off. Pt is taking Tylenol without pain improvement.

## 2019-09-12 NOTE — Discharge Instructions (Signed)
We discussed that the medication can elevate your sugar to be aware of this and monitor. Pt will need to follow upf with her ortho for further treatment options.

## 2019-09-13 ENCOUNTER — Encounter: Payer: Self-pay | Admitting: Podiatry

## 2019-09-13 ENCOUNTER — Other Ambulatory Visit: Payer: Self-pay

## 2019-09-13 ENCOUNTER — Ambulatory Visit (INDEPENDENT_AMBULATORY_CARE_PROVIDER_SITE_OTHER): Payer: Medicare HMO | Admitting: Podiatry

## 2019-09-13 DIAGNOSIS — E1142 Type 2 diabetes mellitus with diabetic polyneuropathy: Secondary | ICD-10-CM | POA: Diagnosis not present

## 2019-09-13 DIAGNOSIS — M79675 Pain in left toe(s): Secondary | ICD-10-CM | POA: Diagnosis not present

## 2019-09-13 DIAGNOSIS — E119 Type 2 diabetes mellitus without complications: Secondary | ICD-10-CM | POA: Diagnosis not present

## 2019-09-13 DIAGNOSIS — M79674 Pain in right toe(s): Secondary | ICD-10-CM

## 2019-09-13 DIAGNOSIS — B351 Tinea unguium: Secondary | ICD-10-CM

## 2019-09-13 DIAGNOSIS — L84 Corns and callosities: Secondary | ICD-10-CM

## 2019-09-13 MED ORDER — NONFORMULARY OR COMPOUNDED ITEM: 3 refills | Status: DC

## 2019-09-13 NOTE — Patient Instructions (Signed)
Diabetes Mellitus and Foot Care Foot care is an important part of your health, especially when you have diabetes. Diabetes may cause you to have problems because of poor blood flow (circulation) to your feet and legs, which can cause your skin to:  Become thinner and drier.  Break more easily.  Heal more slowly.  Peel and crack. You may also have nerve damage (neuropathy) in your legs and feet, causing decreased feeling in them. This means that you may not notice minor injuries to your feet that could lead to more serious problems. Noticing and addressing any potential problems early is the best way to prevent future foot problems. How to care for your feet Foot hygiene  Wash your feet daily with warm water and mild soap. Do not use hot water. Then, pat your feet and the areas between your toes until they are completely dry. Do not soak your feet as this can dry your skin.  Trim your toenails straight across. Do not dig under them or around the cuticle. File the edges of your nails with an emery board or nail file.  Apply a moisturizing lotion or petroleum jelly to the skin on your feet and to dry, brittle toenails. Use lotion that does not contain alcohol and is unscented. Do not apply lotion between your toes. Shoes and socks  Wear clean socks or stockings every day. Make sure they are not too tight. Do not wear knee-high stockings since they may decrease blood flow to your legs.  Wear shoes that fit properly and have enough cushioning. Always look in your shoes before you put them on to be sure there are no objects inside.  To break in new shoes, wear them for just a few hours a day. This prevents injuries on your feet. Wounds, scrapes, corns, and calluses  Check your feet daily for blisters, cuts, bruises, sores, and redness. If you cannot see the bottom of your feet, use a mirror or ask someone for help.  Do not cut corns or calluses or try to remove them with medicine.  If you  find a minor scrape, cut, or break in the skin on your feet, keep it and the skin around it clean and dry. You may clean these areas with mild soap and water. Do not clean the area with peroxide, alcohol, or iodine.  If you have a wound, scrape, corn, or callus on your foot, look at it several times a day to make sure it is healing and not infected. Check for: ? Redness, swelling, or pain. ? Fluid or blood. ? Warmth. ? Pus or a bad smell. General instructions  Do not cross your legs. This may decrease blood flow to your feet.  Do not use heating pads or hot water bottles on your feet. They may burn your skin. If you have lost feeling in your feet or legs, you may not know this is happening until it is too late.  Protect your feet from hot and cold by wearing shoes, such as at the beach or on hot pavement.  Schedule a complete foot exam at least once a year (annually) or more often if you have foot problems. If you have foot problems, report any cuts, sores, or bruises to your health care provider immediately. Contact a health care provider if:  You have a medical condition that increases your risk of infection and you have any cuts, sores, or bruises on your feet.  You have an injury that is not   healing.  You have redness on your legs or feet.  You feel burning or tingling in your legs or feet.  You have pain or cramps in your legs and feet.  Your legs or feet are numb.  Your feet always feel cold.  You have pain around a toenail. Get help right away if:  You have a wound, scrape, corn, or callus on your foot and: ? You have pain, swelling, or redness that gets worse. ? You have fluid or blood coming from the wound, scrape, corn, or callus. ? Your wound, scrape, corn, or callus feels warm to the touch. ? You have pus or a bad smell coming from the wound, scrape, corn, or callus. ? You have a fever. ? You have a red line going up your leg. Summary  Check your feet every day  for cuts, sores, red spots, swelling, and blisters.  Moisturize feet and legs daily.  Wear shoes that fit properly and have enough cushioning.  If you have foot problems, report any cuts, sores, or bruises to your health care provider immediately.  Schedule a complete foot exam at least once a year (annually) or more often if you have foot problems. This information is not intended to replace advice given to you by your health care provider. Make sure you discuss any questions you have with your health care provider. Document Released: 11/18/2000 Document Revised: 01/03/2018 Document Reviewed: 12/23/2016 Elsevier Patient Education  2020 [employer]   Onychomycosis/Fungal Toenails  WHAT IS IT? An infection that lies within the keratin of your nail plate that is caused by a fungus.  WHY ME? Fungal infections affect all ages, sexes, races, and creeds.  There may be many factors that predispose you to a fungal infection such as age, coexisting medical conditions such as diabetes, or an autoimmune disease; stress, medications, fatigue, genetics, etc.  Bottom line: fungus thrives in a warm, moist environment and your shoes offer such a location.  IS IT CONTAGIOUS? Theoretically, yes.  You do not want to share shoes, nail clippers or files with someone who has fungal toenails.  Walking around barefoot in the same room or sleeping in the same bed is unlikely to transfer the organism.  It is important to realize, however, that fungus can spread easily from one nail to the next on the same foot.  HOW DO WE TREAT THIS?  There are several ways to treat this condition.  Treatment may depend on many factors such as age, medications, pregnancy, liver and kidney conditions, etc.  It is best to ask your doctor which options are available to you.  1. No treatment.   Unlike many other medical concerns, you can live with this condition.  However for many people this can be a painful condition and may lead to  ingrown toenails or a bacterial infection.  It is recommended that you keep the nails cut short to help reduce the amount of fungal nail. 2. Topical treatment.  These range from herbal remedies to prescription strength nail lacquers.  About 40-50% effective, topicals require twice daily application for approximately 9 to 12 months or until an entirely new nail has grown out.  The most effective topicals are medical grade medications available through physicians offices. 3. Oral antifungal medications.  With an 80-90% cure rate, the most common oral medication requires 3 to 4 months of therapy and stays in your system for a year as the new nail grows out.  Oral antifungal medications do require   blood work to make sure it is a safe drug for you.  A liver function panel will be performed prior to starting the medication and after the first month of treatment.  It is important to have the blood work performed to avoid any harmful side effects.  In general, this medication safe but blood work is required. 4. Laser Therapy.  This treatment is performed by applying a specialized laser to the affected nail plate.  This therapy is noninvasive, fast, and non-painful.  It is not covered by insurance and is therefore, out of pocket.  The results have been very good with a 80-95% cure rate.  [facility] is the only practice in the area to offer this therapy. 5. Permanent Nail Avulsion.  Removing the entire nail so that a new nail will not grow back. 

## 2019-09-15 NOTE — Progress Notes (Signed)
Subjective: Pamela Roberson presents to clinic with cc of painful mycotic toenails and calluses b/l feet  which are aggravated when weightbearing with and without shoe gear.  This pain limits her daily activities. Pain symptoms resolve with periodic professional debridement.   She relates she has recently received an injection for her sciatica.   Current Outpatient Medications on File Prior to Visit  Medication Sig Dispense Refill  . acyclovir (ZOVIRAX) 400 MG tablet Take 400 mg by mouth 2 (two) times daily.    Marland Kitchen albuterol (PROVENTIL HFA;VENTOLIN HFA) 108 (90 BASE) MCG/ACT inhaler Inhale 2 puffs into the lungs every 4 (four) hours as needed for wheezing or shortness of breath.     Marland Kitchen albuterol (PROVENTIL) (2.5 MG/3ML) 0.083% nebulizer solution Take 3 mLs (2.5 mg total) by nebulization every 4 (four) hours as needed for wheezing or shortness of breath. 30 vial 0  . aluminum-magnesium hydroxide-simethicone (MAALOX) [id number] MG/5ML SUSP Take 30 mLs by mouth 4 (four) times daily -  before meals and at bedtime. (Patient taking differently: Take 30 mLs by mouth 3 (three) times daily as needed (acid reflux). ) 710 mL 0  . Artificial Tear Ointment (DRY EYES OP) Apply 1-2 drops to eye daily as needed (for dry eyes).    Marland Kitchen atorvastatin (LIPITOR) 10 MG tablet Take 10 mg by mouth daily.    Marland Kitchen Dexlansoprazole (DEXILANT PO) Take by mouth.    . EPINEPHrine 0.3 mg/0.3 mL IJ SOAJ injection Inject 0.3 mg into the muscle once as needed (allergic reactions).     . esomeprazole (NEXIUM) 40 MG capsule Take 1 capsule (40 mg total) by mouth daily. 30 capsule 0  . fexofenadine (ALLEGRA) 180 MG tablet Take 1 tablet (180 mg total) by mouth daily. 30 tablet 1  . gabapentin (NEURONTIN) 300 MG capsule Take 300 mg by mouth 3 (three) times daily.    Marland Kitchen guaiFENesin (MUCINEX) 600 MG 12 hr tablet Take 1 tablet (600 mg total) by mouth 2 (two) times daily. 30 tablet 1  . ibuprofen (ADVIL) 600 MG tablet Take 1 tablet (600 mg total)  by mouth every 8 (eight) hours as needed for headache. 30 tablet 0  . Lancets (ONETOUCH DELICA PLUS [id number]) MISC     . loratadine (CLARITIN) 10 MG tablet Take 10 mg by mouth daily.    . metoprolol tartrate (LOPRESSOR) 25 MG tablet Take 25 mg by mouth 2 (two) times daily.    . montelukast (SINGULAIR) 10 MG tablet Take 10 mg by mouth every morning.     . Multiple Vitamin (MULTIVITAMIN WITH MINERALS) TABS tablet Take 1 tablet by mouth daily.    Marland Kitchen nystatin cream (MYCOSTATIN) Apply to affected area 2 times daily 30 g 0  . omeprazole (PRILOSEC) 40 MG capsule     . ondansetron (ZOFRAN) 4 MG tablet     . ondansetron (ZOFRAN-ODT) 4 MG disintegrating tablet Take 1 tablet (4 mg total) by mouth every 8 (eight) hours as needed for nausea or vomiting. 12 tablet 0  . pantoprazole (PROTONIX) 20 MG tablet Take 2 tablets (40 mg total) by mouth daily for 14 days. 28 tablet 0  . predniSONE (DELTASONE) 10 MG tablet Take 1 tablet (10 mg total) by mouth daily. 15 tablet 0  . PROCTOZONE-HC 2.5 % rectal cream APPLY AA BID FOR 14 DAYS.    Marland Kitchen sitaGLIPtin-metformin (JANUMET) 50-500 MG tablet Take 1 tablet by mouth daily.    . sucralfate (CARAFATE) 1 g tablet Take 1 tablet (1 g total) by mouth  3 (three) times daily with meals for 14 days. 42 tablet 0  . trimethoprim-polymyxin b (POLYTRIM) ophthalmic solution Place 1 drop into the right eye every 4 (four) hours. 10 mL 0  . [DISCONTINUED] fluticasone (FLONASE) 50 MCG/ACT nasal spray Place 2 sprays into both nostrils daily. (Patient taking differently: Place 2 sprays into both nostrils daily as needed for allergies. ) 16 g 0   No current facility-administered medications on file prior to visit.      Allergies  Allergen Reactions  . Crab [Shellfish Allergy] Shortness Of Breath and Swelling  . Erythromycin Swelling and Rash  . Fish Allergy     Swelling   . Metoclopramide Other (See Comments)    Slurred speech and unable to move muscles,  Caused her to drag her feet  .  Doxycycline     GI Intolerance  . Levaquin [Levofloxacin In D5w] Other (See Comments)    Causes irregular heart beat  . Morphine Nausea And Vomiting and Rash    Irregular heart beat  . Morphine And Related Nausea And Vomiting and Rash    Irregular heart beat  . Sulfa Antibiotics Rash     Objective: Physical Examination:  Vascular  Examination: Capillary refill time immediate x 10 digits.  Palpable DP pulses b/l.  PT pulses faintly palpable b/l.  Digital hair diminished b/l.  No edema noted b/l.  Skin temperature gradient WNL b/l.  Dermatological Examination: Skin with normal turgor, texture and tone b/l.  No open wounds b/l.  No interdigital macerations noted b/l.  Elongated, thick, discolored brittle toenails with subungual debris and pain on dorsal palpation of nailbeds 1-5 b/l.  Hyperkeratotic lesion submet head 5 b/l  with tenderness to palpation. No edema, no erythema, no drainage, no flocculence.   Macules plantar aspect of both feet: lateral aspect right foot and central aspect left foot and left heel. Oval shape, brownish in color, no nodularity, no elevation, no bleeding.   Musculoskeletal Examination: Muscle strength 5/5 to all muscle groups b/l.  HAV with bunion b/l.  No pain, crepitus or joint discomfort with active/passive ROM.  Neurological Examination: Sensation intact 5/5 b/l with 10 gram monofilament.  Vibratory sensation intact b/l.  Proprioceptive sensation intact b/l.  Assessment: 1. Mycotic nail infection with pain 1-5 b/l 2. Calluses submet head 5 b/l 3. NIDDM with neuropathy  Plan: 1. Toenails 1-5 b/l were debrided in length and girth without iatrogenic laceration. Rx written for nonformulary compounding topical antifungal: [facility]: Antifungal cream - Terbinafine 3%, Fluconazole 2%, Tea Tree Oil 5%, Urea 10%, Ibuprofen 2% in DMSO Suspension #39ml. Apply to the affected nail(s) at bedtime.  2. Calluses pared submetatarsal  head 5 b/l utilizing sterile scalpel blade without incident. 3. Continue soft, supportive shoe gear daily. 4. Report any pedal injuries to medical professional. 5. Follow up 3 months. 6. Patient/POA to call should there be a question/concern in there interim.

## 2019-10-04 ENCOUNTER — Other Ambulatory Visit: Payer: Self-pay

## 2019-10-04 ENCOUNTER — Ambulatory Visit
Admission: RE | Admit: 2019-10-04 | Discharge: 2019-10-04 | Disposition: A | Discharge status: Home / Self Care | Payer: Medicare HMO | Source: Ambulatory Visit | Attending: Family Medicine | Admitting: Family Medicine

## 2019-10-04 DIAGNOSIS — Z1231 Encounter for screening mammogram for malignant neoplasm of breast: Secondary | ICD-10-CM

## 2019-11-07 ENCOUNTER — Other Ambulatory Visit: Payer: Self-pay

## 2019-11-07 ENCOUNTER — Ambulatory Visit (HOSPITAL_COMMUNITY)
Admission: EM | Admit: 2019-11-07 | Discharge: 2019-11-07 | Disposition: A | Discharge status: Home / Self Care | Payer: Medicare HMO | Attending: Internal Medicine | Admitting: Internal Medicine

## 2019-11-07 ENCOUNTER — Encounter (HOSPITAL_COMMUNITY): Payer: Self-pay

## 2019-11-07 DIAGNOSIS — Z882 Allergy status to sulfonamides status: Secondary | ICD-10-CM | POA: Diagnosis not present

## 2019-11-07 DIAGNOSIS — Z91013 Allergy to seafood: Secondary | ICD-10-CM | POA: Insufficient documentation

## 2019-11-07 DIAGNOSIS — Z7984 Long term (current) use of oral hypoglycemic drugs: Secondary | ICD-10-CM | POA: Insufficient documentation

## 2019-11-07 DIAGNOSIS — E119 Type 2 diabetes mellitus without complications: Secondary | ICD-10-CM | POA: Diagnosis not present

## 2019-11-07 DIAGNOSIS — Z888 Allergy status to other drugs, medicaments and biological substances status: Secondary | ICD-10-CM | POA: Diagnosis not present

## 2019-11-07 DIAGNOSIS — Z79899 Other long term (current) drug therapy: Secondary | ICD-10-CM | POA: Diagnosis not present

## 2019-11-07 DIAGNOSIS — Z881 Allergy status to other antibiotic agents status: Secondary | ICD-10-CM | POA: Insufficient documentation

## 2019-11-07 DIAGNOSIS — I10 Essential (primary) hypertension: Secondary | ICD-10-CM | POA: Insufficient documentation

## 2019-11-07 DIAGNOSIS — G4730 Sleep apnea, unspecified: Secondary | ICD-10-CM | POA: Insufficient documentation

## 2019-11-07 DIAGNOSIS — J449 Chronic obstructive pulmonary disease, unspecified: Secondary | ICD-10-CM | POA: Diagnosis not present

## 2019-11-07 DIAGNOSIS — K219 Gastro-esophageal reflux disease without esophagitis: Secondary | ICD-10-CM | POA: Insufficient documentation

## 2019-11-07 DIAGNOSIS — Z885 Allergy status to narcotic agent status: Secondary | ICD-10-CM | POA: Diagnosis not present

## 2019-11-07 DIAGNOSIS — Z87891 Personal history of nicotine dependence: Secondary | ICD-10-CM | POA: Diagnosis not present

## 2019-11-07 DIAGNOSIS — K589 Irritable bowel syndrome without diarrhea: Secondary | ICD-10-CM | POA: Diagnosis not present

## 2019-11-07 DIAGNOSIS — Z833 Family history of diabetes mellitus: Secondary | ICD-10-CM | POA: Insufficient documentation

## 2019-11-07 DIAGNOSIS — Z20828 Contact with and (suspected) exposure to other viral communicable diseases: Secondary | ICD-10-CM | POA: Insufficient documentation

## 2019-11-07 DIAGNOSIS — J019 Acute sinusitis, unspecified: Secondary | ICD-10-CM

## 2019-11-07 MED ORDER — AMOXICILLIN-POT CLAVULANATE 875-125 MG PO TABS: 1.0000 | ORAL_TABLET | Freq: Two times a day (BID) | ORAL | 0 refills | Status: DC

## 2019-11-07 MED ORDER — ONDANSETRON 4 MG PO TBDP: 4.0000 mg | ORAL_TABLET | Freq: Three times a day (TID) | ORAL | 0 refills | Status: DC | PRN

## 2019-11-07 NOTE — ED Triage Notes (Signed)
Patient presents to Urgent Care with complaints of cough, nasal congestion, headaches, chills, generalized body aches, frequent urination, left ear pain, and abdominal pain since 3 weeks ago. Patient reports she just takes COPD and allergy medicine and tylenol for her other symptoms.

## 2019-11-07 NOTE — ED Provider Notes (Signed)
[place]    CSN: [id number] Arrival date & time: 11/07/19  1552      History   Chief Complaint Chief Complaint  Patient presents with  . Multiple Complaints    HPI Pamela Roberson is a 63 y.o. female with history of hypertension-controlled, COPD-controlled comes to urgent care with 3-week history of foul-smelling yellowish nasal discharge, facial pain and left ear pain.  Patient symptoms started 3 weeks ago and has been persistent.  Nasal drainage is foul-smelling and thick and is associated with postnasal drainage worse at nighttime.  She has facial pressure over the sinus regions.  She admits to having some headaches.  No shortness of breath, wheezing or sputum production.  No significant cough.  She has had some chills without fever.  Occasionally she may have a sore throat and pain in the ears.  She also admits to having some chills and generalized body aches.  No sick contacts or recent travel.  No attendance to large gatherings.   HPI  Past Medical History:  Diagnosis Date  . Arthritis   . Diabetes mellitus without complication ([place])    type II   . Diverticulitis   . Dyspnea    mild   . GERD (gastroesophageal reflux disease)   . History of bronchitis   . Hypertension   . IBS (irritable bowel syndrome)   . Palpitations   . Sleep apnea    mild but no sleep apnea   . TMJ (dislocation of temporomandibular joint)   . 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 Dizzy 12/11/2017  . Pharyngeal dysphagia 12/11/2017  . Post-nasal drip 12/11/2017  . Referred ear pain, bilateral 12/11/2017  . Diabetes mellitus without complication ([place]) [id number]  . Family history of glaucoma 06/29/2017  . Hyperopia of both eyes with astigmatism and presbyopia 06/29/2017  . Keratoconjunctivitis sicca of both eyes not specified as Sjogren's 06/29/2017  . Nuclear sclerotic cataract of both eyes 06/29/2017  . Vitreous  floater, bilateral 06/29/2017  . Biceps tendinitis of right shoulder 04/05/2017  . Impingement syndrome of right shoulder 04/05/2017  . Arthritis of right acromioclavicular joint 04/05/2017  . Partial nontraumatic tear of rotator cuff, right 04/05/2017    Past Surgical History:  Procedure Laterality Date  . ABDOMINAL HYSTERECTOMY    . ESOPHAGOGASTRODUODENOSCOPY ENDOSCOPY     with esophagus being stretched twice per pt,   . EYE SURGERY    . 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 feeding tube placement and removal   2015  . NASAL SEPTUM SURGERY    . spurs removed from esophagus   2015  . TONSILLECTOMY      OB History   No obstetric history on file.      Home Medications    Prior to Admission medications   Medication Sig Start Date End Date Taking? Authorizing Provider  loratadine (CLARITIN) 10 MG tablet Take 10 mg by mouth daily.   Yes [provider]  metoprolol tartrate (LOPRESSOR) 25 MG tablet Take 25 mg by mouth 2 (two) times daily.   Yes [provider]  acyclovir (ZOVIRAX) 400 MG tablet Take 400 mg by mouth 2 (two) times daily.    [provider]  albuterol (PROVENTIL HFA;VENTOLIN HFA) 108 (90 BASE) MCG/ACT inhaler Inhale 2 puffs into the lungs every 4 (four) hours as needed for wheezing or shortness of breath.  [provider]  albuterol (PROVENTIL) (2.5 MG/3ML) 0.083% nebulizer solution Take 3 mLs (2.5 mg total) by nebulization every 4 (four) hours as needed for wheezing or shortness of breath. 03/11/19   Raylene Everts, MD  aluminum-magnesium hydroxide-simethicone (MAALOX) 200-200-20 MG/5ML SUSP Take 30 mLs by mouth 4 (four) times daily -  before meals and at bedtime. Patient taking differently: Take 30 mLs by mouth 3 (three) times daily as needed (acid reflux).  04/21/19   Zigmund Gottron, NP  amoxicillin-clavulanate (AUGMENTIN) 875-125 MG tablet Take 1 tablet by mouth every 12 (twelve) hours. 11/07/19   LampteyMyrene Galas, MD   Artificial Tear Ointment (DRY EYES OP) Apply 1-2 drops to eye daily as needed (for dry eyes).    [provider]  Dexlansoprazole (DEXILANT PO) Take by mouth.    [provider]  EPINEPHrine 0.3 mg/0.3 mL IJ SOAJ injection Inject 0.3 mg into the muscle once as needed (allergic reactions).     [provider]  esomeprazole (NEXIUM) 40 MG capsule Take 1 capsule (40 mg total) by mouth daily. 04/24/19   Charlesetta Shanks, MD  fexofenadine (ALLEGRA) 180 MG tablet Take 1 tablet (180 mg total) by mouth daily. 09/07/19   Shelda Pal, DO  gabapentin (NEURONTIN) 300 MG capsule Take 300 mg by mouth 3 (three) times daily.    [provider]  guaiFENesin (MUCINEX) 600 MG 12 hr tablet Take 1 tablet (600 mg total) by mouth 2 (two) times daily. 04/24/19   Charlesetta Shanks, MD  ibuprofen (ADVIL) 600 MG tablet Take 1 tablet (600 mg total) by mouth every 8 (eight) hours as needed for headache. 07/13/19   Zigmund Gottron, NP  Lancets (ONETOUCH DELICA PLUS [id number]) [place]  08/15/19   [provider]  montelukast (SINGULAIR) 10 MG tablet Take 10 mg by mouth every morning.     [provider]  Multiple Vitamin (MULTIVITAMIN WITH MINERALS) TABS tablet Take 1 tablet by mouth daily.    [provider]  nystatin cream (MYCOSTATIN) Apply to affected area 2 times daily 07/13/19   Augusto Gamble B, NP  ondansetron (ZOFRAN ODT) 4 MG disintegrating tablet Take 1 tablet (4 mg total) by mouth every 8 (eight) hours as needed for nausea or vomiting. 11/07/19   Ketrick Matney, Myrene Galas, MD  PROCTOZONE-HC 2.5 % rectal cream APPLY AA BID FOR 14 DAYS. 05/23/19   [provider]  sitaGLIPtin-metformin (JANUMET) 50-500 MG tablet Take 1 tablet by mouth daily.    [provider]  sucralfate (CARAFATE) 1 g tablet Take 1 tablet (1 g total) by mouth 3 (three) times daily with meals for 14 days. 05/11/19 05/25/19  Couture, Cortni S, PA-C  trimethoprim-polymyxin b (POLYTRIM)  ophthalmic solution Place 1 drop into the right eye every 4 (four) hours. 09/07/19   Shelda Pal, DO  atorvastatin (LIPITOR) 10 MG tablet Take 10 mg by mouth daily.  11/07/19  [provider]  fluticasone (FLONASE) 50 MCG/ACT nasal spray Place 2 sprays into both nostrils daily. Patient taking differently: Place 2 sprays into both nostrils daily as needed for allergies.  06/08/18 09/07/19  Raylene Everts, MD  pantoprazole (PROTONIX) 20 MG tablet Take 2 tablets (40 mg total) by mouth daily for 14 days. 05/11/19 11/07/19  Couture, Cortni S, PA-C    Family History Family History  Problem Relation Age of Onset  . Diabetes Father     Social History Social History   Tobacco Use  . Smoking status: Former Research scientist (life sciences)  .  Smokeless tobacco: Never Used  Substance Use Topics  . Alcohol use: No  . Drug use: No     Allergies   Crab [shellfish allergy], Erythromycin, Fish allergy, Metoclopramide, Doxycycline, Levaquin [levofloxacin in d5w], Morphine, Morphine and related, and Sulfa antibiotics   Review of Systems Review of Systems  Constitutional: Positive for activity change, chills and fatigue.  HENT: Positive for congestion, postnasal drip, rhinorrhea, sinus pressure, sinus pain and sore throat. Negative for hearing loss and mouth sores.   Eyes: Negative for photophobia, pain and redness.  Respiratory: Positive for cough. Negative for chest tightness, shortness of breath and wheezing.   Cardiovascular: Negative for chest pain and palpitations.  Gastrointestinal: Negative for abdominal pain, nausea and vomiting.  Genitourinary: Negative for dysuria, frequency, hematuria and urgency.  Musculoskeletal: Negative for arthralgias and myalgias.  Skin: Negative.   Neurological: Positive for light-headedness. Negative for dizziness, syncope and weakness.  Psychiatric/Behavioral: Negative for confusion and decreased concentration.     Physical Exam Triage Vital Signs ED Triage  Vitals  Enc Vitals Group     BP 11/07/19 1633 121/74     Pulse Rate 11/07/19 1633 73     Resp 11/07/19 1633 17     Temp 11/07/19 1633 99.2 F (37.3 C)     Temp Source 11/07/19 1633 Oral     SpO2 11/07/19 1633 100 %     Weight --      Height --      Head Circumference --      Peak Flow --      Pain Score 11/07/19 1630 8     Pain Loc --      Pain Edu? --      Excl. in [place]? --    No data found.  Updated Vital Signs BP 121/74 (BP Location: Left Arm)   Pulse 73   Temp 99.2 F (37.3 C) (Oral)   Resp 17   SpO2 100%   Visual Acuity Right Eye Distance:   Left Eye Distance:   Bilateral Distance:    Right Eye Near:   Left Eye Near:    Bilateral Near:     Physical Exam Constitutional:      General: She is not in acute distress.    Appearance: She is ill-appearing.  HENT:     Right Ear: Tympanic membrane normal.     Left Ear: Tympanic membrane normal.     Nose: Nose normal.     Mouth/Throat:     Mouth: Mucous membranes are moist.     Pharynx: No posterior oropharyngeal erythema.  Eyes:     Conjunctiva/sclera: Conjunctivae normal.     Pupils: Pupils are equal, round, and reactive to light.  Cardiovascular:     Rate and Rhythm: Normal rate and regular rhythm.     Pulses: Normal pulses.     Heart sounds: Normal heart sounds.  Pulmonary:     Effort: Pulmonary effort is normal. No respiratory distress.     Breath sounds: Normal breath sounds. No rhonchi or rales.  Abdominal:     General: Abdomen is flat. Bowel sounds are normal. There is no distension.     Tenderness: There is no abdominal tenderness. There is no guarding.  Musculoskeletal:        General: No swelling or tenderness.  Skin:    General: Skin is warm.     Capillary Refill: Capillary refill takes less than 2 seconds.     Findings: No erythema or rash.  Neurological:  General: No focal deficit present.     Mental Status: She is alert and oriented to person, place, and time.      UC Treatments /  Results  Labs (all labs ordered are listed, but only abnormal results are displayed) Labs Reviewed  NOVEL CORONAVIRUS, NAA (HOSP ORDER, SEND-OUT TO REF LAB; TAT 18-24 HRS)  GLUCOSE, CAPILLARY  CBG MONITORING, ED    EKG   Radiology No results found.  Procedures Procedures (including critical care time)  Medications Ordered in UC Medications - No data to display  Initial Impression / Assessment and Plan / UC Course  I have reviewed the triage vital signs and the nursing notes.  Pertinent labs & imaging results that were available during my care of the patient were reviewed by me and considered in my medical decision making (see chart for details).     1.  Acute sinusitis greater than 10 days: COVID-19 testing Augmentin 875/125 mg twice daily for 7 days Zofran 4 mg every 6 hours as needed If patient's symptoms worsens she is advised to return to urgent care to be reevaluated  2.  COPD without acute exacerbation: Patient is advised to continue bronchodilator treatments She denies any tobacco use at this time. Final Clinical Impressions(s) / UC Diagnoses   Final diagnoses:  Acute sinusitis with symptoms > 10 days   Discharge Instructions   None    ED Prescriptions    Medication Sig Dispense Auth. Provider   ondansetron (ZOFRAN ODT) 4 MG disintegrating tablet Take 1 tablet (4 mg total) by mouth every 8 (eight) hours as needed for nausea or vomiting. 20 tablet Tytionna Cloyd, Myrene Galas, MD   amoxicillin-clavulanate (AUGMENTIN) 875-125 MG tablet Take 1 tablet by mouth every 12 (twelve) hours. 14 tablet Judy Goodenow, Myrene Galas, MD     PDMP not reviewed this encounter.   Chase Picket, MD 11/08/19 1728

## 2019-11-08 LAB — NOVEL CORONAVIRUS, NAA (HOSP ORDER, SEND-OUT TO REF LAB; TAT 18-24 HRS): SARS-CoV-2, NAA: NOT DETECTED

## 2019-12-03 ENCOUNTER — Encounter: Payer: Self-pay | Admitting: *Deleted

## 2019-12-09 ENCOUNTER — Ambulatory Visit (INDEPENDENT_AMBULATORY_CARE_PROVIDER_SITE_OTHER): Payer: Medicare HMO | Admitting: Diagnostic Neuroimaging

## 2019-12-09 ENCOUNTER — Other Ambulatory Visit: Payer: Self-pay

## 2019-12-09 ENCOUNTER — Encounter: Payer: Self-pay | Admitting: Diagnostic Neuroimaging

## 2019-12-09 VITALS — BP 124/82 | HR 68 | Temp 96.9°F | Ht 67.5 in | Wt 198.0 lb

## 2019-12-09 DIAGNOSIS — M5441 Lumbago with sciatica, right side: Secondary | ICD-10-CM | POA: Diagnosis not present

## 2019-12-09 DIAGNOSIS — G8929 Other chronic pain: Secondary | ICD-10-CM | POA: Diagnosis not present

## 2019-12-09 DIAGNOSIS — M5442 Lumbago with sciatica, left side: Secondary | ICD-10-CM

## 2019-12-09 MED ORDER — ALPRAZOLAM 0.5 MG PO TABS: ORAL_TABLET | ORAL | 0 refills | Status: DC
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
## 2019-12-09 NOTE — Patient Instructions (Signed)
-   check MRI lumbar spine  - consider PT evaluation  - consider pain mgmt referral  - optimize nutrition and exercises

## 2019-12-31 ENCOUNTER — Other Ambulatory Visit: Payer: Self-pay

## 2019-12-31 ENCOUNTER — Encounter (HOSPITAL_COMMUNITY): Payer: Self-pay | Admitting: Emergency Medicine

## 2019-12-31 ENCOUNTER — Ambulatory Visit (HOSPITAL_COMMUNITY)
Admission: EM | Admit: 2019-12-31 | Discharge: 2019-12-31 | Disposition: A | Discharge status: Home / Self Care | Payer: Medicare HMO | Attending: Family Medicine | Admitting: Family Medicine

## 2019-12-31 DIAGNOSIS — J441 Chronic obstructive pulmonary disease with (acute) exacerbation: Secondary | ICD-10-CM | POA: Diagnosis not present

## 2019-12-31 MED ORDER — AZITHROMYCIN 250 MG PO TABS: 250.0000 mg | ORAL_TABLET | Freq: Every day | ORAL | 0 refills | Status: DC

## 2019-12-31 MED ORDER — PREDNISONE 10 MG (21) PO TBPK: ORAL_TABLET | Freq: Every day | ORAL | 0 refills | Status: DC

## 2019-12-31 NOTE — ED Triage Notes (Signed)
Complains of sinus congestion one month ago and seemed to get better.   Now feels like throat is on fire, headache, throat congestion, chills.  Patient reports symptoms worsened 3 weeks ago.

## 2019-12-31 NOTE — ED Provider Notes (Signed)
Pamela Roberson   [id number] 12/31/19 Arrival Time: [id number]  ASSESSMENT & PLAN:  1. COPD exacerbation ([place])     Begin: Meds ordered this encounter  Medications  . azithromycin (ZITHROMAX) 250 MG tablet    Sig: Take 1 tablet (250 mg total) by mouth daily. Take first 2 tablets together, then 1 every day until finished.    Dispense:  6 tablet    Refill:  0  . predniSONE (STERAPRED UNI-PAK 21 TAB) 10 MG (21) TBPK tablet    Sig: Take by mouth daily. Take as directed.    Dispense:  21 tablet    Refill:  0    No indication for chest imaging at this time. OTC symptom care as needed. Ensure adequate fluid intake and rest.   Recommend: Follow-up Information    Schedule an appointment as soon as possible for a visit  with [facility] [place] information: [address] [place] [id number] [phone]            Reviewed expectations re: course of current medical issues. Questions answered. Outlined signs and symptoms indicating need for more acute intervention. Patient verbalized understanding. After Visit Summary given.   SUBJECTIVE: History from: patient. Seen here on 11/07/2019; treated with Augmentin for sinus infection. Note reviewed by me. She reports these symptoms improved.  Pamela Roberson is a 64 y.o. female with PMH of DM and COPD who now reports a sore throat with post-nasal drainage, mild generalized headache. Current symptoms with gradual onset, first noted approx approx 2-3 w ago. Respiratory symptoms: "coughing a bunch now"; is productive of thick non-bloody mucous. Fever: absent. Overall normal PO intake without n/v. OTC treatment: none reported. Seasonal allergies: none reported. No specific aggravating or alleviating factors reported. COVID-19 contact/exposure: none known.   Social History   Tobacco Use  Smoking Status Former Smoker  . Packs/day: 0.50  . Years: 30.00  . Pack years: 15.00  . Quit date: 12/08/1994  . Years  since quitting: 25.0  Smokeless Tobacco Never Used    OBJECTIVE:  Vitals:   12/31/19 1020  BP: 120/80  Pulse: 72  Resp: (!) 22  Temp: 98.4 F (36.9 C)  TempSrc: Oral  SpO2: 99%    Recheck: RR 18  General appearance: alert; no distress HEENT: nasal congestion; clear runny nose; throat irritation secondary to post-nasal drainage; no sinus pain reported Neck: supple without LAD; trachea midline Lungs: unlabored respirations, symmetrical air entry; cough: mild here; speaks full sentences without difficulty; no respiratory distress; mild initial expiratory wheezes on exam Skin: warm and dry Psychological: alert and cooperative; normal mood and affect   Allergies  Allergen Reactions  . Crab [Shellfish Allergy] Shortness Of Breath and Swelling  . Erythromycin Swelling and Rash  . Fish Allergy     Swelling   . Metoclopramide Other (See Comments)    Slurred speech and unable to move muscles,  Caused her to drag her feet  . Doxycycline     GI Intolerance  . Levaquin [Levofloxacin In D5w] Other (See Comments)    Causes irregular heart beat  . Morphine Nausea And Vomiting and Rash    Irregular heart beat  . Morphine And Related Nausea And Vomiting and Rash    Irregular heart beat  . Sulfa Antibiotics Rash    Past Medical History:  Diagnosis Date  . Arthritis   . Asthma   . Carotid stenosis, bilateral   . Chronic low back pain   . COPD (chronic  obstructive pulmonary disease) ([place])   . Diabetes mellitus without complication ([place])    type II   . Diabetic neuropathy ([place])   . Diverticulitis   . Dyspnea    mild   . Frequent falls   . GERD (gastroesophageal reflux disease)   . History of bronchitis   . Hypercholesterolemia   . Hypertension   . IBS (irritable bowel syndrome)   . Osteoarthritis    knee  . Palpitations   . Sleep apnea    mild but no sleep apnea   . TMJ (dislocation of temporomandibular joint)   . 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 Vitamin D  deficiency    Family History  Problem Relation Age of Onset  . Diabetes Father        type 2  . Heart disease Father   . Hypertension Father   . Heart disease Brother   . Hypertension Brother    Social History   Socioeconomic History  . Marital status: Widowed    Spouse name: Not on file  . Number of children: 3  . Years of education: Not on file  . Highest education level: 11th grade  Occupational History    Comment: NA  Tobacco Use  . Smoking status: Former Smoker    Packs/day: 0.50    Years: 30.00    Pack years: 15.00    Quit date: 12/08/1994    Years since quitting: 25.0  . Smokeless tobacco: Never Used  Substance and Sexual Activity  . Alcohol use: No  . Drug use: No  . Sexual activity: Not on file  Other Topics Concern  . Not on file  Social History Narrative   Lives with daughter   Caffeine- coffee 12 oz   Social Determinants of Health   Financial Resource Strain:   . Difficulty of Paying Living Expenses: Not on file  Food Insecurity:   . Worried About Charity fundraiser in the Last Year: Not on file  . Ran Out of Food in the Last Year: Not on file  Transportation Needs:   . Lack of Transportation (Medical): Not on file  . Lack of Transportation (Non-Medical): Not on file  Physical Activity:   . Days of Exercise per Week: Not on file  . Minutes of Exercise per Session: Not on file  Stress:   . Feeling of Stress : Not on file  Social Connections:   . Frequency of Communication with Friends and Family: Not on file  . Frequency of Social Gatherings with Friends and Family: Not on file  . Attends Religious Services: Not on file  . Active Member of Clubs or Organizations: Not on file  . Attends Archivist Meetings: Not on file  . Marital Status: Not on file  Intimate Partner Violence:   . Fear of Current or Ex-Partner: Not on file  . Emotionally Abused: Not on file  . Physically Abused: Not on file  . Sexually Abused: Not on file             Vanessa Kick, MD 12/31/19 1039

## 2019-12-31 NOTE — Discharge Instructions (Addendum)
Prednisone will cause your blood sugars to run high. Be aware.

## 2020-01-04 ENCOUNTER — Other Ambulatory Visit: Payer: Medicare HMO

## 2020-01-07 ENCOUNTER — Other Ambulatory Visit: Payer: Medicare HMO

## 2020-01-20 ENCOUNTER — Other Ambulatory Visit: Payer: Self-pay

## 2020-01-20 ENCOUNTER — Emergency Department (HOSPITAL_COMMUNITY)
Admission: EM | Admit: 2020-01-20 | Discharge: 2020-01-20 | Disposition: A | Discharge status: Home / Self Care | Payer: Medicare HMO | Attending: Emergency Medicine | Admitting: Emergency Medicine

## 2020-01-20 ENCOUNTER — Emergency Department (HOSPITAL_COMMUNITY): Payer: Medicare HMO

## 2020-01-20 ENCOUNTER — Encounter (HOSPITAL_COMMUNITY): Payer: Self-pay | Admitting: Emergency Medicine

## 2020-01-20 DIAGNOSIS — R059 Cough, unspecified: Secondary | ICD-10-CM

## 2020-01-20 DIAGNOSIS — Z79899 Other long term (current) drug therapy: Secondary | ICD-10-CM | POA: Insufficient documentation

## 2020-01-20 DIAGNOSIS — R05 Cough: Secondary | ICD-10-CM | POA: Diagnosis not present

## 2020-01-20 DIAGNOSIS — J449 Chronic obstructive pulmonary disease, unspecified: Secondary | ICD-10-CM | POA: Diagnosis not present

## 2020-01-20 DIAGNOSIS — E119 Type 2 diabetes mellitus without complications: Secondary | ICD-10-CM | POA: Insufficient documentation

## 2020-01-20 DIAGNOSIS — Z20822 Contact with and (suspected) exposure to covid-19: Secondary | ICD-10-CM | POA: Insufficient documentation

## 2020-01-20 DIAGNOSIS — I10 Essential (primary) hypertension: Secondary | ICD-10-CM | POA: Insufficient documentation

## 2020-01-20 DIAGNOSIS — R0789 Other chest pain: Secondary | ICD-10-CM | POA: Insufficient documentation

## 2020-01-20 DIAGNOSIS — Z7984 Long term (current) use of oral hypoglycemic drugs: Secondary | ICD-10-CM | POA: Diagnosis not present

## 2020-01-20 DIAGNOSIS — J45909 Unspecified asthma, uncomplicated: Secondary | ICD-10-CM | POA: Diagnosis not present

## 2020-01-20 LAB — BASIC METABOLIC PANEL
Anion gap: 9 (ref 5–15)
BUN: 6 mg/dL — ABNORMAL LOW (ref 8–23)
CO2: 26 mmol/L (ref 22–32)
Calcium: 9.6 mg/dL (ref 8.9–10.3)
Chloride: 107 mmol/L (ref 98–111)
Creatinine, Ser: 0.9 mg/dL (ref 0.44–1.00)
GFR calc Af Amer: >60 mL/min (ref >60)
GFR calc non Af Amer: >60 mL/min (ref >60)
Glucose, Bld: 100 mg/dL — ABNORMAL HIGH (ref 70–99)
Potassium: 4.3 mmol/L (ref 3.5–5.1)
Sodium: 142 mmol/L (ref 135–145)

## 2020-01-20 LAB — CBC
HCT: 45.8 % (ref 36.0–46.0)
Hemoglobin: 14.4 g/dL (ref 12.0–15.0)
MCH: 29.1 pg (ref 26.0–34.0)
MCHC: 31.4 g/dL (ref 30.0–36.0)
MCV: 92.7 fL (ref 80.0–100.0)
Platelets: 216 10*3/uL (ref 150–400)
RBC: 4.94 MIL/uL (ref 3.87–5.11)
RDW: 12.9 % (ref 11.5–15.5)
WBC: 4.2 10*3/uL (ref 4.0–10.5)
nRBC: 0 % (ref 0.0–0.2)

## 2020-01-20 LAB — RESPIRATORY PANEL BY RT PCR (FLU A&B, COVID)
Influenza A by PCR: NEGATIVE
Influenza B by PCR: NEGATIVE
SARS Coronavirus 2 by RT PCR: NEGATIVE

## 2020-01-20 LAB — TROPONIN I (HIGH SENSITIVITY): Troponin I (High Sensitivity): <2 ng/L (ref <18)

## 2020-01-20 LAB — D-DIMER, QUANTITATIVE: D-Dimer, Quant: <0.27 ug/mL-FEU (ref 0.00–0.50)

## 2020-01-20 MED ORDER — BENZONATATE 100 MG PO CAPS: 100.0000 mg | ORAL_CAPSULE | Freq: Three times a day (TID) | ORAL | 0 refills | Status: DC

## 2020-01-20 NOTE — ED Provider Notes (Signed)
[facility] EMERGENCY DEPARTMENT Provider Note   CSN: [id number] Arrival date & time: 01/20/20  1212     History Chief Complaint  Patient presents with  . Cough    Pamela Roberson is a 64 y.o. female history of COPD, asthma, diabetes, GERD, hypertension, hypercholesterolemia, sleep apnea, positive tuberculosis test.  Patient presents today for chest pain and cough.  She reports that over the past 5 weeks she has had a constant nonproductive cough no clear aggravating or alleviating factors.  She reports she was seen at urgent care approximately 2 weeks ago and was prescribed prednisone and azithromycin, she took these medications as prescribed without change to her symptoms.  She feels as if she has mucus caught in her throat that she cannot expel.  Patient second concern today is chest pain she describes a pressure/soreness, minimal x4 days nonradiating, worsened with cough and slightly improved with rest.  Patient reports she feels this is due to cough.  She denies any fever/chills, headache, neck pain, shortness of breath, abdominal pain, nausea/vomiting, diarrhea, numbness/tingling, weakness, extremity swelling/color change or any additional concerns.  HPI     Past Medical History:  Diagnosis Date  . Arthritis   . Asthma   . Carotid stenosis, bilateral   . Chronic low back pain   . COPD (chronic obstructive pulmonary disease) ([place])   . Diabetes mellitus without complication ([place])    type II   . Diabetic neuropathy ([place])   . Diverticulitis   . Dyspnea    mild   . Frequent falls   . GERD (gastroesophageal reflux disease)   . History of bronchitis   . Hypercholesterolemia   . Hypertension   . IBS (irritable bowel syndrome)   . Osteoarthritis    knee  . Palpitations   . Sleep apnea    mild but no sleep apnea   . TMJ (dislocation of temporomandibular joint)   . 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 Vitamin D deficiency     Patient  Active Problem List   Diagnosis Date Noted  . Dizzy 12/11/2017  . Pharyngeal dysphagia 12/11/2017  . Post-nasal drip 12/11/2017  . Referred ear pain, bilateral 12/11/2017  . Diabetes mellitus without complication ([place]) [id number]  . Family history of glaucoma 06/29/2017  . Hyperopia of both eyes with astigmatism and presbyopia 06/29/2017  . Keratoconjunctivitis sicca of both eyes not specified as Sjogren's 06/29/2017  . Nuclear sclerotic cataract of both eyes 06/29/2017  . Vitreous floater, bilateral 06/29/2017  . Biceps tendinitis of right shoulder 04/05/2017  . Impingement syndrome of right shoulder 04/05/2017  . Arthritis of right acromioclavicular joint 04/05/2017  . Partial nontraumatic tear of rotator cuff, right 04/05/2017    Past Surgical History:  Procedure Laterality Date  . ABDOMINAL HYSTERECTOMY  1993  . CARPAL TUNNEL RELEASE Right   . CERVICAL DISC SURGERY  04/29/2015   Dr Tammi Klippel, [place], [place]  . ESOPHAGOGASTRODUODENOSCOPY ENDOSCOPY     with esophagus being stretched twice per pt,   . EYE SURGERY    . 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 feeding tube placement and removal   2015  . NASAL SEPTUM SURGERY    . spurs removed from esophagus   2015  . TONSILLECTOMY       OB History   No obstetric history on file.     Family History  Problem Relation Age of Onset  . Diabetes Father  type 2  . Heart disease Father   . Hypertension Father   . Heart disease Brother   . Hypertension Brother     Social History   Tobacco Use  . Smoking status: Former Smoker    Packs/day: 0.50    Years: 30.00    Pack years: 15.00    Quit date: 12/08/1994    Years since quitting: 25.1  . Smokeless tobacco: Never Used  Substance Use Topics  . Alcohol use: No  . Drug use: No    Home Medications Prior to Admission medications   Medication Sig Start Date End Date Taking? Authorizing Provider  acyclovir (ZOVIRAX) 400 MG tablet Take 400 mg by mouth 2 (two) times  daily.    [provider]  albuterol (PROVENTIL HFA;VENTOLIN HFA) 108 (90 BASE) MCG/ACT inhaler Inhale 2 puffs into the lungs every 4 (four) hours as needed for wheezing or shortness of breath.     [provider]  albuterol (PROVENTIL) (2.5 MG/3ML) 0.083% nebulizer solution Take 3 mLs (2.5 mg total) by nebulization every 4 (four) hours as needed for wheezing or shortness of breath. 03/11/19   Raylene Everts, MD  ALPRAZolam Duanne Moron) 0.5 MG tablet for sedation before MRI scan; take 1 tab 1 hour before scan; may repeat 1 tab 15 min before scan 12/09/19   Penumalli, Earlean Polka, MD  aluminum-magnesium hydroxide-simethicone (MAALOX) I037812 MG/5ML SUSP Take 30 mLs by mouth 4 (four) times daily -  before meals and at bedtime. Patient taking differently: Take 30 mLs by mouth 3 (three) times daily as needed (acid reflux).  04/21/19   Zigmund Gottron, NP  Artificial Tear Ointment (DRY EYES OP) Apply 1-2 drops to eye daily as needed (for dry eyes).    [provider]  azithromycin (ZITHROMAX) 250 MG tablet Take 1 tablet (250 mg total) by mouth daily. Take first 2 tablets together, then 1 every day until finished. 12/31/19   Vanessa Kick, MD  DEXILANT 60 MG capsule 60 mg. 11/30/19   [provider]  dicyclomine (BENTYL) 20 MG tablet Take 20 mg by mouth 3 (three) times daily. 10/17/19   [provider]  EPINEPHrine 0.3 mg/0.3 mL IJ SOAJ injection Inject 0.3 mg into the muscle once as needed (allergic reactions).     [provider]  esomeprazole (NEXIUM) 40 MG capsule Take 1 capsule (40 mg total) by mouth daily. 04/24/19   Charlesetta Shanks, MD  fexofenadine (ALLEGRA) 180 MG tablet Take 1 tablet (180 mg total) by mouth daily. 09/07/19   Shelda Pal, DO  gabapentin (NEURONTIN) 300 MG capsule Take 300 mg by mouth 3 (three) times daily.    [provider]  guaiFENesin (MUCINEX) 600 MG 12 hr tablet Take 1 tablet (600 mg total) by mouth 2 (two)  times daily. 04/24/19   Charlesetta Shanks, MD  ibuprofen (ADVIL) 600 MG tablet Take 1 tablet (600 mg total) by mouth every 8 (eight) hours as needed for headache. 07/13/19   Zigmund Gottron, NP  Lancets (ONETOUCH DELICA PLUS [id number]) [place]  08/15/19   [provider]  loratadine (CLARITIN) 10 MG tablet Take 10 mg by mouth daily.    [provider]  metoprolol tartrate (LOPRESSOR) 25 MG tablet Take 25 mg by mouth 2 (two) times daily.    [provider]  montelukast (SINGULAIR) 10 MG tablet Take 10 mg by mouth every morning.     [provider]  Multiple Vitamin (MULTIVITAMIN WITH MINERALS) TABS tablet Take 1  tablet by mouth daily.    [provider]  nystatin cream (MYCOSTATIN) Apply to affected area 2 times daily 07/13/19   Augusto Gamble B, NP  ondansetron (ZOFRAN ODT) 4 MG disintegrating tablet Take 1 tablet (4 mg total) by mouth every 8 (eight) hours as needed for nausea or vomiting. 11/07/19   Chase Picket, MD  pantoprazole (PROTONIX) 40 MG tablet  11/20/19   [provider]  predniSONE (STERAPRED UNI-PAK 21 TAB) 10 MG (21) TBPK tablet Take by mouth daily. Take as directed. 12/31/19   Vanessa Kick, MD  PROCTOZONE-HC 2.5 % rectal cream APPLY AA BID FOR 14 DAYS. 05/23/19   [provider]  sitaGLIPtin-metformin (JANUMET) 50-500 MG tablet Take 1 tablet by mouth daily.    [provider]  trimethoprim-polymyxin b (POLYTRIM) ophthalmic solution Place 1 drop into the right eye every 4 (four) hours. 09/07/19   Shelda Pal, DO  atorvastatin (LIPITOR) 10 MG tablet Take 10 mg by mouth daily.  12/09/19  [provider]  fluticasone (FLONASE) 50 MCG/ACT nasal spray Place 2 sprays into both nostrils daily. Patient taking differently: Place 2 sprays into both nostrils daily as needed for allergies.  06/08/18 12/09/19  Raylene Everts, MD    Allergies    Otho Darner allergy], Erythromycin, Fish allergy, Metoclopramide,  Doxycycline, Levaquin [levofloxacin in d5w], Morphine, Morphine and related, and Sulfa antibiotics  Review of Systems   Review of Systems Ten systems are reviewed and are negative for acute change except as noted in the HPI  Physical Exam Updated Vital Signs BP 115/65 (BP Location: Right Arm)   Pulse 71   Temp 98.4 F (36.9 C) (Oral)   Resp 16   Ht 5\' 7"  (1.702 m)   Wt 88.9 kg   SpO2 100%   BMI 30.70 kg/m   Physical Exam Constitutional:      General: She is not in acute distress.    Appearance: Normal appearance. She is well-developed. She is not ill-appearing or diaphoretic.  HENT:     Head: Normocephalic and atraumatic.     Jaw: There is normal jaw occlusion.     Right Ear: External ear normal.     Left Ear: External ear normal.     Nose: Nose normal.     Mouth/Throat:     Mouth: Mucous membranes are moist.     Pharynx: Oropharynx is clear. Uvula midline.  Eyes:     General: Vision grossly intact. Gaze aligned appropriately.     Pupils: Pupils are equal, round, and reactive to light.  Neck:     Trachea: Trachea and phonation normal. No tracheal deviation.  Cardiovascular:     Rate and Rhythm: Normal rate and regular rhythm.     Pulses: Normal pulses.     Heart sounds: Normal heart sounds.  Pulmonary:     Effort: Pulmonary effort is normal. No respiratory distress.  Chest:     Chest wall: Tenderness present. No deformity or crepitus.  Abdominal:     General: There is no distension.     Palpations: Abdomen is soft.     Tenderness: There is no abdominal tenderness. There is no guarding or rebound.  Musculoskeletal:        General: Normal range of motion.     Cervical back: Normal range of motion.     Right lower leg: No edema.     Left lower leg: No edema.  Skin:    General: Skin is warm and dry.  Neurological:     Mental Status: She is alert.     GCS: GCS eye subscore is 4. GCS verbal subscore is 5. GCS motor subscore is 6.     Comments: Speech is clear and  goal oriented, follows commands Major Cranial nerves without deficit, no facial droop Moves extremities without ataxia, coordination intact  Psychiatric:        Behavior: Behavior normal.     ED Results / Procedures / Treatments   Labs (all labs ordered are listed, but only abnormal results are displayed) Labs Reviewed  BASIC METABOLIC PANEL - Abnormal; Notable for the following components:      Result Value   Glucose, Bld 100 (*)    BUN 6 (*)    All other components within normal limits  RESPIRATORY PANEL BY RT PCR (FLU A&B, COVID)  CBC  D-DIMER, QUANTITATIVE (NOT AT [facility])  TROPONIN I (HIGH SENSITIVITY)    EKG EKG Interpretation  Date/Time:  Monday January 20 2020 14:31:47 EST Ventricular Rate:  68 PR Interval:  162 QRS Duration: 84 QT Interval:  382 QTC Calculation: 406 R Axis:   -39 Text Interpretation: Normal sinus rhythm Left axis deviation Abnormal ECG No STEMI Confirmed by Nanda Quinton [phone]) on 01/20/2020 4:36:36 PM   Radiology DG Chest 2 View  Result Date: 01/20/2020 CLINICAL DATA:  Cough. EXAM: CHEST - 2 VIEW COMPARISON:  08/17/2019 FINDINGS: The heart size and mediastinal contours are within normal limits. Both lungs are clear. The visualized skeletal structures are unremarkable. IMPRESSION: No active cardiopulmonary disease. Electronically Signed   By: Misty Stanley M.D.   On: 01/20/2020 12:55    Procedures Procedures (including critical care time)  Medications Ordered in ED Medications - No data to display  ED Course  I have reviewed the triage vital signs and the nursing notes.  Pertinent labs & imaging results that were available during my care of the patient were reviewed by me and considered in my medical decision making (see chart for details).    MDM Rules/Calculators/A&P                     64 year old female with history of COPD presents today for primarily cough for the past 5 weeks, also describes a mild chest pain x4 days.  Chart review  shows patient recently prescribed azithromycin and prednisone on 12/31/2019 at urgent care, she reports compliance with this medication regimen.  On initial evaluation she is well-appearing and in no acute distress, cranial nerve is intact, no meningeal signs, airway clear without evidence of infection, heart regular rate and rhythm without murmur, lungs clear to auscultation bilaterally, abdomen soft nontender without peritoneal signs she is neurovascular intact to all 4 extremities without evidence of DVT.  Will obtain chest pain work-up and Covid test today.  Patient will remain on monitor during this visit. - CBC within normal limits no evidence of anemia or infection BMP with glucose 100, BUN 6, otherwise normal limits, no acute metabolic abnormalities High-sensitivity troponin negative, in setting of 4 days of symptoms there is no negation for delta troponin D-dimer negative, low suspicion for pulmonary embolism Covid/flu panel negative Chest x-ray:    IMPRESSION:  No active cardiopulmonary disease.  I have personally reviewed chest x-ray and agree with radiologist interpretation  EKG: Normal sinus rhythm Left axis deviation Abnormal ECG No STEMI Confirmed by Nanda Quinton [phone]) on 01/20/2020 4:36:36 PM - Based on history, symptoms and physical examination today no evidence for ACS as cause  of patient's symptoms, there is no indication for delta troponin at this time.  Additionally with negative D-dimer, no tachycardia or hypoxia on room air doubt pulmonary embolism as etiology of her symptoms.  History and work-up inconsistent with dissection or other acute cardiopulmonary etiology of her symptoms today.  She has no fever or leukocytosis to suggest pneumonia and chest x-ray is negative for acute findings.  Suspect chest pain today secondary to musculoskeletal pain due to her cough, is reproducible with palpation.  On reexamination patient is resting comfortably and in no acute distress denies  any pain at this time reports she is feeling well besides her cough.  Of note patient is not on an ACE/ARB and has no evidence of allergic reaction.  Vital signs stable on room air.  I discussed the case with Dr. Laverta Baltimore, do not feel patient will benefit from any additional antibiotics at this time, will prescribe Tessalon and have her follow-up with her primary care provider.  Patient reports that she is able to see her primary care provider at [facility] this week, I have encouraged her to go to that appointment.  At this time there does not appear to be any evidence of an acute emergency medical condition and the patient appears stable for discharge with appropriate outpatient follow up. Diagnosis was discussed with patient who verbalizes understanding of care plan and is agreeable to discharge. I have discussed return precautions with patient who verbalizes understanding of return precautions. Patient encouraged to follow-up with their PCP. All questions answered.  Patient has been discharged in good condition.  Pamela Roberson was evaluated in Emergency Department on 01/20/2020 for the symptoms described in the history of present illness. She was evaluated in the context of the global COVID-19 pandemic, which necessitated consideration that the patient might be at risk for infection with the SARS-CoV-2 virus that causes COVID-19. Institutional protocols and algorithms that pertain to the evaluation of patients at risk for COVID-19 are in a state of rapid change based on information released by regulatory bodies including the CDC and federal and state organizations. These policies and algorithms were followed during the patient's care in the ED.  Note: Portions of this report may have been transcribed using voice recognition software. Every effort was made to ensure accuracy; however, inadvertent computerized transcription errors may still be present. Final Clinical Impression(s) / ED  Diagnoses Final diagnoses:  None    Rx / DC Orders ED Discharge Orders    None       Gari Crown 01/20/20 1659    Margette Fast, MD 01/22/20 2115

## 2020-01-20 NOTE — ED Notes (Signed)
Patient verbalizes understanding of discharge instructions . Opportunity for questions and answers were provided . Armband removed by staff ,Pt discharged from ED. W/C  offered at D/C  and Declined W/C at D/C and was escorted to lobby by RN.  

## 2020-01-20 NOTE — Discharge Instructions (Addendum)
You have been diagnosed today with cough, atypical chest pain.  At this time there does not appear to be the presence of an emergent medical condition, however there is always the potential for conditions to change. Please read and follow the below instructions.  Please return to the Emergency Department immediately for any new or worsening symptoms. Please be sure to follow up with your Primary Care Provider within one week regarding your visit today; please call their office to schedule an appointment even if you are feeling better for a follow-up visit. You may take the medication Tessalon as prescribed to help with your cough.  Please drink plenty of water and get plenty of rest.  Please see your primary care provider within one week for a follow-up visit.  Get help right away if: Your chest pain returns. You have a cough that gets worse, or you cough up blood. You have very bad (severe) pain in your belly (abdomen). You pass out (faint). You have either of these for no clear reason: Sudden chest discomfort. Sudden discomfort in your arms, back, neck, or jaw. You have shortness of breath at any time. You suddenly start to sweat, or your skin gets clammy. You feel sick to your stomach (nauseous). You throw up (vomit). You suddenly feel lightheaded or dizzy. You feel very weak or tired. Your heart starts to beat fast, or it feels like it is skipping beats. You cough up blood. You have trouble breathing. Your heartbeat is very fast. You have a rash or swelling of your face, head, neck or mouth You have any new/concerning or worsening symptoms  Please read the additional information packets attached to your discharge summary.  Do not take your medicine if  develop an itchy rash, swelling in your mouth or lips, or difficulty breathing; call 911 and seek immediate emergency medical attention if this occurs.  Note: Portions of this text may have been transcribed using voice recognition  software. Every effort was made to ensure accuracy; however, inadvertent computerized transcription errors may still be present.

## 2020-01-20 NOTE — ED Triage Notes (Signed)
Pt reports she was seen at [facility] about 2 weeks ago for COPD excerebration but continues to have mucus in her throat and a cough. No relief with sinus medications.

## 2020-02-19 ENCOUNTER — Encounter: Payer: Self-pay | Admitting: Podiatry

## 2020-02-19 ENCOUNTER — Other Ambulatory Visit: Payer: Self-pay

## 2020-02-19 ENCOUNTER — Ambulatory Visit (INDEPENDENT_AMBULATORY_CARE_PROVIDER_SITE_OTHER): Payer: Medicare HMO | Admitting: Podiatry

## 2020-02-19 VITALS — Temp 97.3°F

## 2020-02-19 DIAGNOSIS — M2011 Hallux valgus (acquired), right foot: Secondary | ICD-10-CM

## 2020-02-19 DIAGNOSIS — B351 Tinea unguium: Secondary | ICD-10-CM

## 2020-02-19 DIAGNOSIS — E1142 Type 2 diabetes mellitus with diabetic polyneuropathy: Secondary | ICD-10-CM

## 2020-02-19 DIAGNOSIS — M79674 Pain in right toe(s): Secondary | ICD-10-CM

## 2020-02-19 DIAGNOSIS — M79675 Pain in left toe(s): Secondary | ICD-10-CM

## 2020-02-19 DIAGNOSIS — M2012 Hallux valgus (acquired), left foot: Secondary | ICD-10-CM | POA: Diagnosis not present

## 2020-02-19 DIAGNOSIS — L84 Corns and callosities: Secondary | ICD-10-CM

## 2020-02-19 NOTE — Patient Instructions (Signed)
Corns and Calluses Corns are small areas of thickened skin that occur on the top, sides, or tip of a toe. They contain a cone-shaped core with a point that can press on a nerve below. This causes pain.  Calluses are areas of thickened skin that can occur anywhere on the body, including the hands, fingers, palms, soles of the feet, and heels. Calluses are usually larger than corns. What are the causes? Corns and calluses are caused by rubbing (friction) or pressure, such as from shoes that are too tight or do not fit properly. What increases the risk? Corns are more likely to develop in people who have misshapen toes (toe deformities), such as hammer toes. Calluses can occur with friction to any area of the skin. They are more likely to develop in people who:  Work with their hands.  Wear shoes that fit poorly, are too tight, or are high-heeled.  Have toe deformities. What are the signs or symptoms? Symptoms of a corn or callus include:  A hard growth on the skin.  Pain or tenderness under the skin.  Redness and swelling.  Increased discomfort while wearing tight-fitting shoes, if your feet are affected. If a corn or callus becomes infected, symptoms may include:  Redness and swelling that gets worse.  Pain.  Fluid, blood, or pus draining from the corn or callus. How is this diagnosed? Corns and calluses may be diagnosed based on your symptoms, your medical history, and a physical exam. How is this treated? Treatment for corns and calluses may include:  Removing the cause of the friction or pressure. This may involve: ? Changing your shoes. ? Wearing shoe inserts (orthotics) or other protective layers in your shoes, such as a corn pad. ? Wearing gloves.  Applying medicine to the skin (topical medicine) to help soften skin in the hardened, thickened areas.  Removing layers of dead skin with a file to reduce the size of the corn or callus.  Removing the corn or callus with a  scalpel or laser.  Taking antibiotic medicines, if your corn or callus is infected.  Having surgery, if a toe deformity is the cause. Follow these instructions at home:   Take over-the-counter and prescription medicines only as told by your health care provider.  If you were prescribed an antibiotic, take it as told by your health care provider. Do not stop taking it even if your condition starts to improve.  Wear shoes that fit well. Avoid wearing high-heeled shoes and shoes that are too tight or too loose.  Wear any padding, protective layers, gloves, or orthotics as told by your health care provider.  Soak your hands or feet and then use a file or pumice stone to soften your corn or callus. Do this as told by your health care provider.  Check your corn or callus every day for symptoms of infection. Contact a health care provider if you:  Notice that your symptoms do not improve with treatment.  Have redness or swelling that gets worse.  Notice that your corn or callus becomes painful.  Have fluid, blood, or pus coming from your corn or callus.  Have new symptoms. Summary  Corns are small areas of thickened skin that occur on the top, sides, or tip of a toe.  Calluses are areas of thickened skin that can occur anywhere on the body, including the hands, fingers, palms, and soles of the feet. Calluses are usually larger than corns.  Corns and calluses are caused by   rubbing (friction) or pressure, such as from shoes that are too tight or do not fit properly.  Treatment may include wearing any padding, protective layers, gloves, or orthotics as told by your health care provider. This information is not intended to replace advice given to you by your health care provider. Make sure you discuss any questions you have with your health care provider. Document Revised: 03/13/2019 Document Reviewed: 10/04/2017 Elsevier Patient Education  [place].  Diabetes Mellitus and  [place]ane care is an important part of your health, especially when you have diabetes. Diabetes may cause you to have problems because of poor blood flow (circulation) to your feet and legs, which can cause your skin to:  Become thinner and drier.  Break more easily.  Heal more slowly.  Peel and crack. You may also have nerve damage (neuropathy) in your legs and feet, causing decreased feeling in them. This means that you may not notice minor injuries to your feet that could lead to more serious problems. Noticing and addressing any potential problems early is the best way to prevent future foot problems. How to care for your feet Foot hygiene  Wash your feet daily with warm water and mild soap. Do not use hot water. Then, pat your feet and the areas between your toes until they are completely dry. Do not soak your feet as this can dry your skin.  Trim your toenails straight across. Do not dig under them or around the cuticle. File the edges of your nails with an emery board or nail file.  Apply a moisturizing lotion or petroleum jelly to the skin on your feet and to dry, brittle toenails. Use lotion that does not contain alcohol and is unscented. Do not apply lotion between your toes. Shoes and socks  Wear clean socks or stockings every day. Make sure they are not too tight. Do not wear knee-high stockings since they may decrease blood flow to your legs.  Wear shoes that fit properly and have enough cushioning. Always look in your shoes before you put them on to be sure there are no objects inside.  To break in new shoes, wear them for just a few hours a day. This prevents injuries on your feet. Wounds, scrapes, corns, and calluses  Check your feet daily for blisters, cuts, bruises, sores, and redness. If you cannot see the bottom of your feet, use a mirror or ask someone for help.  Do not cut corns or calluses or try to remove them with medicine.  If you find a minor scrape,  cut, or break in the skin on your feet, keep it and the skin around it clean and dry. You may clean these areas with mild soap and water. Do not clean the area with peroxide, alcohol, or iodine.  If you have a wound, scrape, corn, or callus on your foot, look at it several times a day to make sure it is healing and not infected. Check for: ? Redness, swelling, or pain. ? Fluid or blood. ? Warmth. ? Pus or a bad smell. General instructions  Do not cross your legs. This may decrease blood flow to your feet.  Do not use heating pads or hot water bottles on your feet. They may burn your skin. If you have lost feeling in your feet or legs, you may not know this is happening until it is too late.  Protect your feet from hot and cold by wearing shoes, such as at  the beach or on hot pavement.  Schedule a complete foot exam at least once a year (annually) or more often if you have foot problems. If you have foot problems, report any cuts, sores, or bruises to your health care provider immediately. Contact a health care provider if:  You have a medical condition that increases your risk of infection and you have any cuts, sores, or bruises on your feet.  You have an injury that is not healing.  You have redness on your legs or feet.  You feel burning or tingling in your legs or feet.  You have pain or cramps in your legs and feet.  Your legs or feet are numb.  Your feet always feel cold.  You have pain around a toenail. Get help right away if:  You have a wound, scrape, corn, or callus on your foot and: ? You have pain, swelling, or redness that gets worse. ? You have fluid or blood coming from the wound, scrape, corn, or callus. ? Your wound, scrape, corn, or callus feels warm to the touch. ? You have pus or a bad smell coming from the wound, scrape, corn, or callus. ? You have a fever. ? You have a red line going up your leg. Summary  Check your feet every day for cuts, sores, red  spots, swelling, and blisters.  Moisturize feet and legs daily.  Wear shoes that fit properly and have enough cushioning.  If you have foot problems, report any cuts, sores, or bruises to your health care provider immediately.  Schedule a complete foot exam at least once a year (annually) or more often if you have foot problems. This information is not intended to replace advice given to you by your health care provider. Make sure you discuss any questions you have with your health care provider. Document Revised: 08/14/2019 Document Reviewed: 12/23/2016 Elsevier Patient Education  [place].

## 2020-02-24 NOTE — Progress Notes (Signed)
Subjective: Pamela Roberson presents today for follow up of preventative diabetic foot care and callus(es) b/l feet and painful mycotic toenails b/l that are difficult to trim. Pain interferes with ambulation. Aggravating factors include wearing enclosed shoe gear. Pain is relieved with periodic professional debridement.   She has symptoms of diabetic neuropathy which is controlled with gabapentin.  Allergies  Allergen Reactions  . Crab [Shellfish Allergy] Shortness Of Breath and Swelling  . Erythromycin Swelling and Rash  . Fish Allergy     Swelling   . Metoclopramide Other (See Comments)    Slurred speech and unable to move muscles,  Caused her to drag her feet  . Doxycycline     GI Intolerance  . Levaquin [Levofloxacin In D5w] Other (See Comments)    Causes irregular heart beat  . Morphine Nausea And Vomiting and Rash    Irregular heart beat  . Morphine And Related Nausea And Vomiting and Rash    Irregular heart beat  . Sulfa Antibiotics Rash     Objective: Vitals:   02/19/20 1044  Temp: (!) 97.3 F (36.3 C)    Pt 64 y.o. year old female  in NAD. AAO x 3.   Vascular Examination:  Capillary refill time to digits immediate b/l. Palpable DP pulses b/l. Faintly palpable PT pulses b/l. Pedal hair absent b/l Skin temperature gradient within normal limits b/l. No pain with calf compression b/l.  Dermatological Examination: Pedal skin with normal turgor, texture and tone bilaterally. No open wounds bilaterally. No interdigital macerations bilaterally. Toenails 1-5 b/l elongated, dystrophic, thickened, crumbly with subungual debris and tenderness to dorsal palpation. Hyperkeratotic lesion(s) submet head 5 b/l.  No erythema, no edema, no drainage, no flocculence.  Musculoskeletal: Normal muscle strength 5/5 to all lower extremity muscle groups bilaterally, no pain crepitus or joint limitation noted with ROM b/l and bunion deformity noted b/l  Neurological: Protective sensation  intact 5/5 intact bilaterally with 10g monofilament b/l Vibratory sensation intact b/l  Assessment: 1. Pain due to onychomycosis of toenails of both feet   2. Callus   3. Hallux valgus, acquired, bilateral   4. Diabetic peripheral neuropathy associated with type 2 diabetes mellitus (HCC)    Plan: -Toenails 1-5 b/l were debrided in length and girth with sterile nail nippers and dremel without iatrogenic bleeding.  -Callus(es) submet head 5 b/l and bus5th met base right foot were debrided without complication or incident. Total number debrided =3. -Patient to continue soft, supportive shoe gear daily. Start procedure for diabetic shoes. Patient qualifies based on diagnoses. -Patient to report any pedal injuries to medical professional immediately. -Patient/POA to call should there be question/concern in the interim.  Return in about 3 months (around 05/21/2020) for diabetic nail and callus trim.

## 2020-03-21 ENCOUNTER — Encounter (HOSPITAL_COMMUNITY): Payer: Self-pay

## 2020-03-21 ENCOUNTER — Ambulatory Visit (HOSPITAL_COMMUNITY)
Admission: EM | Admit: 2020-03-21 | Discharge: 2020-03-21 | Disposition: A | Discharge status: Home / Self Care | Payer: Medicare HMO | Attending: Family Medicine | Admitting: Family Medicine

## 2020-03-21 ENCOUNTER — Other Ambulatory Visit: Payer: Self-pay

## 2020-03-21 DIAGNOSIS — K219 Gastro-esophageal reflux disease without esophagitis: Secondary | ICD-10-CM

## 2020-03-21 MED ORDER — LIDOCAINE VISCOUS HCL 2 % MT SOLN
OROMUCOSAL | Status: AC
  Filled 2020-03-21: qty 15

## 2020-03-21 MED ORDER — ONDANSETRON 4 MG PO TBDP
ORAL_TABLET | ORAL | Status: AC
  Filled 2020-03-21: qty 1

## 2020-03-21 MED ORDER — ALUM & MAG HYDROXIDE-SIMETH 200-200-20 MG/5ML PO SUSP
30.0000 mL | Freq: Once | ORAL | Status: AC
  Administered 2020-03-21: 30 mL via ORAL

## 2020-03-21 MED ORDER — ONDANSETRON 4 MG PO TBDP: 4.0000 mg | ORAL_TABLET | Freq: Three times a day (TID) | ORAL | 0 refills | Status: DC | PRN

## 2020-03-21 MED ORDER — LIDOCAINE VISCOUS HCL 2 % MT SOLN
15.0000 mL | Freq: Once | OROMUCOSAL | Status: AC
  Administered 2020-03-21: 15 mL via ORAL

## 2020-03-21 MED ORDER — ONDANSETRON 4 MG PO TBDP
4.0000 mg | ORAL_TABLET | Freq: Once | ORAL | Status: AC
  Administered 2020-03-21: 4 mg via ORAL

## 2020-03-21 MED ORDER — ALUM & MAG HYDROXIDE-SIMETH 200-200-20 MG/5ML PO SUSP
ORAL | Status: AC
  Filled 2020-03-21: qty 30

## 2020-03-21 NOTE — ED Notes (Signed)
Pt c/o mid-epigastric pain to LUQ for approx 1 week with n/v. Pt states she vomited last night and has ongoing nausea at present. Also states that it "feels like something is stuck my esophagus". Denies SOB, CP, diaphoresis or other illness.

## 2020-03-21 NOTE — Discharge Instructions (Addendum)
Keep taking your acid reflux medication as prescribed. Zofran as needed for nausea, vomiting.  We gave you a GI cocktail here to help with your discomfort. Recommend the symptoms continue you will need to follow-up with your GI specialist Avoid spicy, greasy foods for now Small bland meals

## 2020-03-22 NOTE — ED Provider Notes (Signed)
[facility]    CSN: [id number] Arrival date & time: 03/21/20  1622      History   Chief Complaint Chief Complaint  Patient presents with  . Abdominal Pain  . Gastroesophageal Reflux    HPI Pamela Roberson is a 64 y.o. female.   Patient is a 64 year old female past medical history of arthritis, asthma, COPD, diabetes, neuropathy, diverticulitis, dyspnea, GERD, bronchitis, hypertension, hypercholesterolemia, IBS, osteoarthritis.  She presents today with central chest discomfort, burning in chest.  She is also has some increased gas.  Feels like she is unable to pass food well.  Sensation of food getting stuck.  Recent esophageal stretching approximately 4 months ago.  Takes Dexilant and Carafate daily.  Denies any recent diet changes.  Reporting ate some soup earlier today.  No nausea, vomiting, diarrhea.  No cough, fevers or shortness of breath.  No blood in stool.  ROS per HPI      Past Medical History:  Diagnosis Date  . Arthritis   . Asthma   . Carotid stenosis, bilateral   . Chronic low back pain   . COPD (chronic obstructive pulmonary disease) ([place])   . Diabetes mellitus without complication ([place])    type II   . Diabetic neuropathy ([place])   . Diverticulitis   . Dyspnea    mild   . Frequent falls   . GERD (gastroesophageal reflux disease)   . History of bronchitis   . Hypercholesterolemia   . Hypertension   . IBS (irritable bowel syndrome)   . Osteoarthritis    knee  . Palpitations   . Sleep apnea    mild but no sleep apnea   . TMJ (dislocation of temporomandibular joint)   . 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 Vitamin D deficiency     Patient Active Problem List   Diagnosis Date Noted  . Dizzy 12/11/2017  . Pharyngeal dysphagia 12/11/2017  . Post-nasal drip 12/11/2017  . Referred ear pain, bilateral 12/11/2017  . Diabetes mellitus without complication ([place]) [id number]  . Family history of glaucoma 06/29/2017  . Hyperopia of  both eyes with astigmatism and presbyopia 06/29/2017  . Keratoconjunctivitis sicca of both eyes not specified as Sjogren's 06/29/2017  . Nuclear sclerotic cataract of both eyes 06/29/2017  . Vitreous floater, bilateral 06/29/2017  . Biceps tendinitis of right shoulder 04/05/2017  . Impingement syndrome of right shoulder 04/05/2017  . Arthritis of right acromioclavicular joint 04/05/2017  . Partial nontraumatic tear of rotator cuff, right 04/05/2017    Past Surgical History:  Procedure Laterality Date  . ABDOMINAL HYSTERECTOMY  1993  . CARPAL TUNNEL RELEASE Right   . CERVICAL DISC SURGERY  04/29/2015   Dr Tammi Klippel, [place], [place]  . ESOPHAGOGASTRODUODENOSCOPY ENDOSCOPY     with esophagus being stretched twice per pt,   . EYE SURGERY    . 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 feeding tube placement and removal   2015  . NASAL SEPTUM SURGERY    . spurs removed from esophagus   2015  . TONSILLECTOMY      OB History   No obstetric history on file.      Home Medications    Prior to Admission medications   Medication Sig Start Date End Date Taking? Authorizing Provider  acyclovir (ZOVIRAX) 400 MG tablet Take 400 mg by mouth 2 (two) times daily.    [provider]  albuterol (PROVENTIL HFA;VENTOLIN HFA) 108 (90 BASE)  MCG/ACT inhaler Inhale 2 puffs into the lungs every 4 (four) hours as needed for wheezing or shortness of breath.     [provider]  albuterol (PROVENTIL) (2.5 MG/3ML) 0.083% nebulizer solution Take 3 mLs (2.5 mg total) by nebulization every 4 (four) hours as needed for wheezing or shortness of breath. 03/11/19   Raylene Everts, MD  ALPRAZolam Duanne Moron) 0.5 MG tablet for sedation before MRI scan; take 1 tab 1 hour before scan; may repeat 1 tab 15 min before scan 12/09/19   Penumalli, Earlean Polka, MD  aluminum-magnesium hydroxide-simethicone (MAALOX) [id number] MG/5ML SUSP Take 30 mLs by mouth 4 (four) times daily -  before meals and at bedtime. Patient  taking differently: Take 30 mLs by mouth 3 (three) times daily as needed (acid reflux).  04/21/19   Zigmund Gottron, NP  Artificial Tear Ointment (DRY EYES OP) Apply 1-2 drops to eye daily as needed (for dry eyes).    [provider]  azithromycin (ZITHROMAX) 250 MG tablet Take 1 tablet (250 mg total) by mouth daily. Take first 2 tablets together, then 1 every day until finished. 12/31/19   Vanessa Kick, MD  benzonatate (TESSALON) 100 MG capsule Take 1 capsule (100 mg total) by mouth every 8 (eight) hours. 01/20/20   Deliah Boston, PA-C  cephALEXin (KEFLEX) 500 MG capsule  02/11/20   [provider]  DEXILANT 60 MG capsule 60 mg. 11/30/19   [provider]  dicyclomine (BENTYL) 20 MG tablet Take 20 mg by mouth 3 (three) times daily. 10/17/19   [provider]  EPINEPHrine 0.3 mg/0.3 mL IJ SOAJ injection Inject 0.3 mg into the muscle once as needed (allergic reactions).     [provider]  esomeprazole (NEXIUM) 40 MG capsule Take 1 capsule (40 mg total) by mouth daily. 04/24/19   Charlesetta Shanks, MD  fexofenadine (ALLEGRA) 180 MG tablet Take 1 tablet (180 mg total) by mouth daily. 09/07/19   Shelda Pal, DO  gabapentin (NEURONTIN) 300 MG capsule Take 300 mg by mouth 3 (three) times daily.    [provider]  guaiFENesin (MUCINEX) 600 MG 12 hr tablet Take 1 tablet (600 mg total) by mouth 2 (two) times daily. 04/24/19   Charlesetta Shanks, MD  ibuprofen (ADVIL) 600 MG tablet Take 1 tablet (600 mg total) by mouth every 8 (eight) hours as needed for headache. 07/13/19   Zigmund Gottron, NP  Lancets (ONETOUCH DELICA PLUS [id number]) [place]  08/15/19   [provider]  loratadine (CLARITIN) 10 MG tablet Take 10 mg by mouth daily.    [provider]  metoprolol tartrate (LOPRESSOR) 25 MG tablet Take 25 mg by mouth 2 (two) times daily.    [provider]  montelukast (SINGULAIR) 10 MG tablet Take 10 mg by mouth every morning.      [provider]  Multiple Vitamin (MULTIVITAMIN WITH MINERALS) TABS tablet Take 1 tablet by mouth daily.    [provider]  nystatin cream (MYCOSTATIN) Apply to affected area 2 times daily 07/13/19   Augusto Gamble B, NP  ondansetron (ZOFRAN ODT) 4 MG disintegrating tablet Take 1 tablet (4 mg total) by mouth every 8 (eight) hours as needed for nausea or vomiting. 03/21/20   Loura Halt A, NP  pantoprazole (PROTONIX) 40 MG tablet  11/20/19   [provider]  predniSONE (STERAPRED UNI-PAK 21 TAB) 10 MG (21) TBPK tablet Take by mouth daily. Take as directed. 12/31/19   Vanessa Kick, MD  PROCTOZONE-HC 2.5 % rectal cream APPLY AA BID FOR 14 DAYS. 05/23/19   [provider]  sitaGLIPtin-metformin (JANUMET) 50-500 MG tablet Take 1 tablet by mouth daily.    [provider]  sucralfate (CARAFATE) 1 g tablet  02/11/20   [provider]  trimethoprim-polymyxin b (POLYTRIM) ophthalmic solution Place 1 drop into the right eye every 4 (four) hours. 09/07/19   Shelda Pal, DO  Vitamins/Minerals TABS Take by mouth.    [provider]  Delfin Edis [id number] MCG/DOSE AEPB  01/18/20   [provider]  atorvastatin (LIPITOR) 10 MG tablet Take 10 mg by mouth daily.  12/09/19  [provider]  fluticasone (FLONASE) 50 MCG/ACT nasal spray Place 2 sprays into both nostrils daily. Patient taking differently: Place 2 sprays into both nostrils daily as needed for allergies.  06/08/18 12/09/19  Raylene Everts, MD    Family History Family History  Problem Relation Age of Onset  . Diabetes Father        type 2  . Heart disease Father   . Hypertension Father   . Heart disease Brother   . Hypertension Brother     Social History Social History   Tobacco Use  . Smoking status: Former Smoker    Packs/day: 0.50    Years: 30.00    Pack years: 15.00    Quit date: 12/08/1994    Years since quitting: 25.3  . Smokeless tobacco: Never  Used  Substance Use Topics  . Alcohol use: No  . Drug use: No     Allergies   Crab [shellfish allergy], Erythromycin, Fish allergy, Metoclopramide, Doxycycline, Levaquin [levofloxacin in d5w], Morphine, Morphine and related, and Sulfa antibiotics   Review of Systems Review of Systems   Physical Exam Triage Vital Signs ED Triage Vitals  Enc Vitals Group     BP 03/21/20 1653 128/67     Pulse Rate 03/21/20 1653 78     Resp 03/21/20 1653 16     Temp 03/21/20 1653 97.9 F (36.6 C)     Temp Source 03/21/20 1653 Oral     SpO2 03/21/20 1653 100 %     Weight --      Height --      Head Circumference --      Peak Flow --      Pain Score 03/21/20 1651 3     Pain Loc --      Pain Edu? --      Excl. in [place]? --    No data found.  Updated Vital Signs BP 128/67 (BP Location: Right Arm)   Pulse 78   Temp 97.9 F (36.6 C) (Oral)   Resp 16   SpO2 100%   Visual Acuity Right Eye Distance:   Left Eye Distance:   Bilateral Distance:    Right Eye Near:   Left Eye Near:    Bilateral Near:     Physical Exam Vitals and nursing note reviewed.  Constitutional:      General: She is not in acute distress.    Appearance: Normal appearance. She is not ill-appearing, toxic-appearing or diaphoretic.  HENT:     Head: Normocephalic.     Nose: Nose normal.     Mouth/Throat:     Pharynx: Oropharynx is clear.  Eyes:     Conjunctiva/sclera: Conjunctivae normal.  Pulmonary:     Effort: Pulmonary effort is normal.  Abdominal:     Palpations: Abdomen is soft.     Tenderness:  There is abdominal tenderness in the epigastric area.  Musculoskeletal:        General: Normal range of motion.     Cervical back: Normal range of motion.  Skin:    General: Skin is warm and dry.     Findings: No rash.  Neurological:     Mental Status: She is alert.  Psychiatric:        Mood and Affect: Mood normal.      UC Treatments / Results  Labs (all labs ordered are listed, but only abnormal  results are displayed) Labs Reviewed - No data to display  EKG   Radiology No results found.  Procedures ED EKG  Date/Time: 03/22/2020 10:23 AM Performed by: Orvan July, NP Authorized by: Orvan July, NP   ECG reviewed by ED Physician in the absence of a cardiologist: yes   Previous ECG:    Previous ECG:  Compared to current   Similarity:  No change Interpretation:    Interpretation: normal   Rate:    ECG rate assessment: normal   Rhythm:    Rhythm: sinus rhythm   Ectopy:    Ectopy: none   QRS:    QRS axis:  Normal ST segments:    ST segments:  Normal T waves:    T waves: normal     (including critical care time)  Medications Ordered in UC Medications  ondansetron (ZOFRAN-ODT) disintegrating tablet 4 mg (4 mg Oral Given 03/21/20 1702)  alum & mag hydroxide-simeth (MAALOX/MYLANTA) 200-200-20 MG/5ML suspension 30 mL (30 mLs Oral Given 03/21/20 1758)    And  lidocaine (XYLOCAINE) 2 % viscous mouth solution 15 mL (15 mLs Oral Given 03/21/20 1759)    Initial Impression / Assessment and Plan / UC Course  I have reviewed the triage vital signs and the nursing notes.  Pertinent labs & imaging results that were available during my care of the patient were reviewed by me and considered in my medical decision making (see chart for details).     GERD-most likely diagnosis based on symptoms.  Patient got relief with Zofran and GI cocktail We will have her continue her daily GERD medications and Zofran as needed for nausea, vomiting. Precautions given.  Recommend follow-up with GI specialist this week   Final Clinical Impressions(s) / UC Diagnoses   Final diagnoses:  Gastroesophageal reflux disease, unspecified whether esophagitis present     Discharge Instructions     Keep taking your acid reflux medication as prescribed. Zofran as needed for nausea, vomiting.  We gave you a GI cocktail here to help with your discomfort. Recommend the symptoms continue you will  need to follow-up with your GI specialist Avoid spicy, greasy foods for now Small bland meals    ED Prescriptions    Medication Sig Dispense Auth. Provider   ondansetron (ZOFRAN ODT) 4 MG disintegrating tablet Take 1 tablet (4 mg total) by mouth every 8 (eight) hours as needed for nausea or vomiting. 20 tablet Loura Halt A, NP     PDMP not reviewed this encounter.   Orvan July, NP 03/22/20 1024

## 2020-03-26 ENCOUNTER — Other Ambulatory Visit: Payer: Self-pay

## 2020-03-26 ENCOUNTER — Emergency Department (HOSPITAL_COMMUNITY)
Admission: EM | Admit: 2020-03-26 | Discharge: 2020-03-27 | Disposition: A | Discharge status: Home / Self Care | Payer: Medicare HMO | Attending: Emergency Medicine | Admitting: Emergency Medicine

## 2020-03-26 ENCOUNTER — Emergency Department (HOSPITAL_COMMUNITY): Payer: Medicare HMO

## 2020-03-26 ENCOUNTER — Encounter (HOSPITAL_COMMUNITY): Payer: Self-pay | Admitting: *Deleted

## 2020-03-26 DIAGNOSIS — R1010 Upper abdominal pain, unspecified: Secondary | ICD-10-CM | POA: Insufficient documentation

## 2020-03-26 DIAGNOSIS — Z5321 Procedure and treatment not carried out due to patient leaving prior to being seen by health care provider: Secondary | ICD-10-CM | POA: Insufficient documentation

## 2020-03-26 DIAGNOSIS — R112 Nausea with vomiting, unspecified: Secondary | ICD-10-CM | POA: Diagnosis not present

## 2020-03-26 DIAGNOSIS — R0602 Shortness of breath: Secondary | ICD-10-CM | POA: Insufficient documentation

## 2020-03-26 LAB — BASIC METABOLIC PANEL
Anion gap: 10 (ref 5–15)
BUN: 8 mg/dL (ref 8–23)
CO2: 24 mmol/L (ref 22–32)
Calcium: 9.1 mg/dL (ref 8.9–10.3)
Chloride: 102 mmol/L (ref 98–111)
Creatinine, Ser: 0.97 mg/dL (ref 0.44–1.00)
GFR calc Af Amer: >60 mL/min (ref >60)
GFR calc non Af Amer: >60 mL/min (ref >60)
Glucose, Bld: 110 mg/dL — ABNORMAL HIGH (ref 70–99)
Potassium: 3.9 mmol/L (ref 3.5–5.1)
Sodium: 136 mmol/L (ref 135–145)

## 2020-03-26 LAB — CBC
HCT: 43.3 % (ref 36.0–46.0)
Hemoglobin: 13.6 g/dL (ref 12.0–15.0)
MCH: 28.4 pg (ref 26.0–34.0)
MCHC: 31.4 g/dL (ref 30.0–36.0)
MCV: 90.4 fL (ref 80.0–100.0)
Platelets: 238 10*3/uL (ref 150–400)
RBC: 4.79 MIL/uL (ref 3.87–5.11)
RDW: 13 % (ref 11.5–15.5)
WBC: 4.9 10*3/uL (ref 4.0–10.5)
nRBC: 0 % (ref 0.0–0.2)

## 2020-03-26 LAB — TROPONIN I (HIGH SENSITIVITY)
Troponin I (High Sensitivity): <2 ng/L (ref <18)
Troponin I (High Sensitivity): 3 ng/L (ref <18)

## 2020-03-26 MED ORDER — SODIUM CHLORIDE 0.9% FLUSH: 3.0000 mL | Freq: Once | INTRAVENOUS | Status: DC

## 2020-03-26 NOTE — ED Triage Notes (Signed)
Pt reporting that she has had upper abdominal pain, nausea, vomiting, increased gas and when she eats she feels like she is choking. Reports that she has had some shortness of breath and twitching in her left thigh. Seen for the same last week at [facility], told to take her acid reflux meds and zofran, which she says she has been doing. Symptoms not improved.

## 2020-03-27 DIAGNOSIS — R1010 Upper abdominal pain, unspecified: Secondary | ICD-10-CM | POA: Diagnosis not present

## 2020-03-27 LAB — HEPATIC FUNCTION PANEL
ALT: 21 U/L (ref 0–44)
AST: 24 U/L (ref 15–41)
Albumin: 3.8 g/dL (ref 3.5–5.0)
Alkaline Phosphatase: 78 U/L (ref 38–126)
Bilirubin, Direct: <0.1 mg/dL (ref 0.0–0.2)
Total Bilirubin: 0.7 mg/dL (ref 0.3–1.2)
Total Protein: 6.5 g/dL (ref 6.5–8.1)

## 2020-03-27 LAB — LIPASE, BLOOD: Lipase: 27 U/L (ref 11–51)

## 2020-03-27 NOTE — ED Notes (Signed)
Pt stated that they were leaving  

## 2020-04-09 ENCOUNTER — Other Ambulatory Visit: Payer: Self-pay

## 2020-04-09 ENCOUNTER — Encounter (HOSPITAL_COMMUNITY): Payer: Self-pay

## 2020-04-09 ENCOUNTER — Ambulatory Visit (HOSPITAL_COMMUNITY)
Admission: EM | Admit: 2020-04-09 | Discharge: 2020-04-09 | Disposition: A | Discharge status: Home / Self Care | Payer: Medicare HMO | Attending: Family Medicine | Admitting: Family Medicine

## 2020-04-09 DIAGNOSIS — G44211 Episodic tension-type headache, intractable: Secondary | ICD-10-CM | POA: Diagnosis not present

## 2020-04-09 MED ORDER — KETOROLAC TROMETHAMINE 30 MG/ML IJ SOLN
INTRAMUSCULAR | Status: AC
  Filled 2020-04-09: qty 1

## 2020-04-09 MED ORDER — KETOROLAC TROMETHAMINE 30 MG/ML IJ SOLN
30.0000 mg | Freq: Once | INTRAMUSCULAR | Status: AC
  Administered 2020-04-09: 30 mg via INTRAMUSCULAR

## 2020-04-09 MED ORDER — DEXAMETHASONE SODIUM PHOSPHATE 10 MG/ML IJ SOLN
INTRAMUSCULAR | Status: AC
  Filled 2020-04-09: qty 1

## 2020-04-09 MED ORDER — DEXAMETHASONE SODIUM PHOSPHATE 10 MG/ML IJ SOLN
5.0000 mg | Freq: Once | INTRAMUSCULAR | Status: AC
  Administered 2020-04-09: 5 mg via INTRAVENOUS

## 2020-04-09 NOTE — ED Provider Notes (Signed)
[place]    CSN: [id number] Arrival date & time: 04/09/20  1344      History   Chief Complaint Chief Complaint  Patient presents with  . Headache    HPI Pamela Roberson is a 64 y.o. female.   HPI  Patient presents for evaluation of left sided headache radiating down the left side of neck. Patient endorses a history of recurrent HA, although she has not had a recent headache. She reports undergoing a great deal of stress yesterday prior to the onset of the headache. Headache pain improved with tylenol yesterday, although she complain of mild HA pain at present. She is also experiencing neck arthralgia localized to left posterior neck. Denies any neck stiffness or limited ROM. Denies fever, URI symptoms, SOB, dizziness, visual disturbances, or chest pain. Past Medical History:  Diagnosis Date  . Arthritis   . Asthma   . Carotid stenosis, bilateral   . Chronic low back pain   . COPD (chronic obstructive pulmonary disease) ([place])   . Diabetes mellitus without complication ([place])    type II   . Diabetic neuropathy ([place])   . Diverticulitis   . Dyspnea    mild   . Frequent falls   . GERD (gastroesophageal reflux disease)   . History of bronchitis   . Hypercholesterolemia   . Hypertension   . IBS (irritable bowel syndrome)   . Osteoarthritis    knee  . Palpitations   . Sleep apnea    mild but no sleep apnea   . TMJ (dislocation of temporomandibular joint)   . 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 Vitamin D deficiency     Patient Active Problem List   Diagnosis Date Noted  . Dizzy 12/11/2017  . Pharyngeal dysphagia 12/11/2017  . Post-nasal drip 12/11/2017  . Referred ear pain, bilateral 12/11/2017  . Diabetes mellitus without complication ([place]) [id number]  . Family history of glaucoma 06/29/2017  . Hyperopia of both eyes with astigmatism and presbyopia 06/29/2017  . Keratoconjunctivitis sicca of both eyes not specified as Sjogren's 06/29/2017  .  Nuclear sclerotic cataract of both eyes 06/29/2017  . Vitreous floater, bilateral 06/29/2017  . Biceps tendinitis of right shoulder 04/05/2017  . Impingement syndrome of right shoulder 04/05/2017  . Arthritis of right acromioclavicular joint 04/05/2017  . Partial nontraumatic tear of rotator cuff, right 04/05/2017    Past Surgical History:  Procedure Laterality Date  . ABDOMINAL HYSTERECTOMY  1993  . CARPAL TUNNEL RELEASE Right   . CERVICAL DISC SURGERY  04/29/2015   Dr Tammi Klippel, [place], [place]  . ESOPHAGOGASTRODUODENOSCOPY ENDOSCOPY     with esophagus being stretched twice per pt,   . EYE SURGERY    . 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 feeding tube placement and removal   2015  . NASAL SEPTUM SURGERY    . spurs removed from esophagus   2015  . TONSILLECTOMY      OB History   No obstetric history on file.      Home Medications    Prior to Admission medications   Medication Sig Start Date End Date Taking? Authorizing Provider  acyclovir (ZOVIRAX) 400 MG tablet Take 400 mg by mouth 2 (two) times daily.    [provider]  albuterol (PROVENTIL HFA;VENTOLIN HFA) 108 (90 BASE) MCG/ACT inhaler Inhale 2 puffs into the lungs every 4 (four) hours as needed for wheezing or shortness of breath.  [provider]  albuterol (PROVENTIL) (2.5 MG/3ML) 0.083% nebulizer solution Take 3 mLs (2.5 mg total) by nebulization every 4 (four) hours as needed for wheezing or shortness of breath. 03/11/19   Raylene Everts, MD  ALPRAZolam Duanne Moron) 0.5 MG tablet for sedation before MRI scan; take 1 tab 1 hour before scan; may repeat 1 tab 15 min before scan 12/09/19   Penumalli, Earlean Polka, MD  aluminum-magnesium hydroxide-simethicone (MAALOX) [id number] MG/5ML SUSP Take 30 mLs by mouth 4 (four) times daily -  before meals and at bedtime. Patient taking differently: Take 30 mLs by mouth 3 (three) times daily as needed (acid reflux).  04/21/19   Zigmund Gottron, NP  Artificial Tear  Ointment (DRY EYES OP) Apply 1-2 drops to eye daily as needed (for dry eyes).    [provider]  azithromycin (ZITHROMAX) 250 MG tablet Take 1 tablet (250 mg total) by mouth daily. Take first 2 tablets together, then 1 every day until finished. 12/31/19   Vanessa Kick, MD  benzonatate (TESSALON) 100 MG capsule Take 1 capsule (100 mg total) by mouth every 8 (eight) hours. 01/20/20   Deliah Boston, PA-C  cephALEXin (KEFLEX) 500 MG capsule  02/11/20   [provider]  DEXILANT 60 MG capsule 60 mg. 11/30/19   [provider]  dicyclomine (BENTYL) 20 MG tablet Take 20 mg by mouth 3 (three) times daily. 10/17/19   [provider]  EPINEPHrine 0.3 mg/0.3 mL IJ SOAJ injection Inject 0.3 mg into the muscle once as needed (allergic reactions).     [provider]  esomeprazole (NEXIUM) 40 MG capsule Take 1 capsule (40 mg total) by mouth daily. 04/24/19   Charlesetta Shanks, MD  fexofenadine (ALLEGRA) 180 MG tablet Take 1 tablet (180 mg total) by mouth daily. 09/07/19   Shelda Pal, DO  gabapentin (NEURONTIN) 300 MG capsule Take 300 mg by mouth 3 (three) times daily.    [provider]  guaiFENesin (MUCINEX) 600 MG 12 hr tablet Take 1 tablet (600 mg total) by mouth 2 (two) times daily. 04/24/19   Charlesetta Shanks, MD  ibuprofen (ADVIL) 600 MG tablet Take 1 tablet (600 mg total) by mouth every 8 (eight) hours as needed for headache. 07/13/19   Zigmund Gottron, NP  Lancets (ONETOUCH DELICA PLUS [id number]) [place]  08/15/19   [provider]  loratadine (CLARITIN) 10 MG tablet Take 10 mg by mouth daily.    [provider]  metoprolol tartrate (LOPRESSOR) 25 MG tablet Take 25 mg by mouth 2 (two) times daily.    [provider]  montelukast (SINGULAIR) 10 MG tablet Take 10 mg by mouth every morning.     [provider]  Multiple Vitamin (MULTIVITAMIN WITH MINERALS) TABS tablet Take 1 tablet by mouth daily.    [provider]  nystatin cream (MYCOSTATIN) Apply to affected area 2 times daily 07/13/19   Augusto Gamble B, NP  ondansetron (ZOFRAN ODT) 4 MG disintegrating tablet Take 1 tablet (4 mg total) by mouth every 8 (eight) hours as needed for nausea or vomiting. 03/21/20   Loura Halt A, NP  pantoprazole (PROTONIX) 40 MG tablet  11/20/19   [provider]  predniSONE (STERAPRED UNI-PAK 21 TAB) 10 MG (21) TBPK tablet Take by mouth daily. Take as directed. 12/31/19   Vanessa Kick, MD  PROCTOZONE-HC 2.5 % rectal cream APPLY AA BID FOR 14 DAYS. 05/23/19   [provider]  sitaGLIPtin-metformin (JANUMET) 50-500 MG tablet Take  1 tablet by mouth daily.    [provider]  sucralfate (CARAFATE) 1 g tablet  02/11/20   [provider]  trimethoprim-polymyxin b (POLYTRIM) ophthalmic solution Place 1 drop into the right eye every 4 (four) hours. 09/07/19   Shelda Pal, DO  Vitamins/Minerals TABS Take by mouth.    [provider]  Delfin Edis [id number] MCG/DOSE AEPB  01/18/20   [provider]  atorvastatin (LIPITOR) 10 MG tablet Take 10 mg by mouth daily.  12/09/19  [provider]  fluticasone (FLONASE) 50 MCG/ACT nasal spray Place 2 sprays into both nostrils daily. Patient taking differently: Place 2 sprays into both nostrils daily as needed for allergies.  06/08/18 12/09/19  Raylene Everts, MD    Family History Family History  Problem Relation Age of Onset  . Diabetes Father        type 2  . Heart disease Father   . Hypertension Father   . Heart disease Brother   . Hypertension Brother     Social History Social History   Tobacco Use  . Smoking status: Former Smoker    Packs/day: 0.50    Years: 30.00    Pack years: 15.00    Quit date: 12/08/1994    Years since quitting: 25.3  . Smokeless tobacco: Never Used  Substance Use Topics  . Alcohol use: No  . Drug use: No     Allergies   Crab [shellfish allergy], Erythromycin, Fish  allergy, Metoclopramide, Doxycycline, Levaquin [levofloxacin in d5w], Morphine, Morphine and related, and Sulfa antibiotics   Review of Systems Review of Systems Pertinent negatives listed in HPI  Physical Exam Triage Vital Signs ED Triage Vitals [04/09/20 1402]  Enc Vitals Group     BP 121/82     Pulse Rate 76     Resp 16     Temp 98.4 F (36.9 C)     Temp Source Oral     SpO2 100 %     Weight      Height      Head Circumference      Peak Flow      Pain Score      Pain Loc      Pain Edu?      Excl. in [place]?    No data found.  Updated Vital Signs BP 121/82 (BP Location: Left Arm)   Pulse 76   Temp 98.4 F (36.9 C) (Oral)   Resp 16   SpO2 100%   Visual Acuity Right Eye Distance:   Left Eye Distance:   Bilateral Distance:    Right Eye Near:   Left Eye Near:    Bilateral Near:     Physical Exam HENT:     Head: Normocephalic.  Eyes:     General: No visual field deficit.    Extraocular Movements: Extraocular movements intact.     Pupils: Pupils are equal, round, and reactive to light.  Cardiovascular:     Rate and Rhythm: Normal rate and regular rhythm.  Pulmonary:     Effort: Pulmonary effort is normal.     Breath sounds: Normal breath sounds and air entry.  Musculoskeletal:     Cervical back: Normal range of motion. No rigidity.  Lymphadenopathy:     Cervical: No cervical adenopathy.  Neurological:     Mental Status: She is alert.     GCS: GCS eye subscore is 4. GCS verbal subscore is 5. GCS motor subscore is 6.  Cranial Nerves: No facial asymmetry.     Sensory: No sensory deficit.     Motor: No weakness, tremor, abnormal muscle tone or pronator drift.     Coordination: Coordination normal.     Gait: Gait normal.  Psychiatric:        Mood and Affect: Mood normal.        Behavior: Behavior normal.      UC Treatments / Results  Labs (all labs ordered are listed, but only abnormal results are displayed) Labs Reviewed - No data to  display  EKG   Radiology No results found.  Procedures Procedures (including critical care time)  Medications Ordered in UC Medications - No data to display  Initial Impression / Assessment and Plan / UC Course  I have reviewed the triage vital signs and the nursing notes.  Pertinent labs & imaging results that were available during my care of the patient were reviewed by me and considered in my medical decision making (see chart for details).    Intractable episodic tension-type headache left side, negative for weakness, dizziness, or visual disturbances. Will treat as an acute tension HA.  -Toradol 30 mg IM given in clinic today -Decadron 5 mg IM given in clinic today -Acetaminophen continue as needed for HA pain. Red flags discussed. Strict return precautions given.  Final Clinical Impressions(s) / UC Diagnoses   Final diagnoses:  Intractable episodic tension-type headache left side   Discharge Instructions   None    ED Prescriptions    None     PDMP not reviewed this encounter.   Scot Jun, FNP 04/09/20 2322

## 2020-04-09 NOTE — ED Triage Notes (Signed)
Pt c/o acute onset left head pain, radiating to left face/temporal area, down left neck last night. Pt also c/o drainage from eyes approx 3-4 weeks. States tylenol has improved the pain somewhat.

## 2020-04-22 ENCOUNTER — Other Ambulatory Visit: Payer: Self-pay

## 2020-04-22 ENCOUNTER — Ambulatory Visit: Payer: Medicare HMO | Admitting: Orthotics

## 2020-04-30 ENCOUNTER — Other Ambulatory Visit: Payer: Self-pay | Admitting: Gastroenterology

## 2020-04-30 DIAGNOSIS — R1011 Right upper quadrant pain: Secondary | ICD-10-CM

## 2020-04-30 DIAGNOSIS — R1310 Dysphagia, unspecified: Secondary | ICD-10-CM

## 2020-05-05 ENCOUNTER — Other Ambulatory Visit: Payer: Self-pay

## 2020-05-05 ENCOUNTER — Ambulatory Visit (HOSPITAL_COMMUNITY)
Admission: EM | Admit: 2020-05-05 | Discharge: 2020-05-05 | Disposition: A | Discharge status: Home / Self Care | Payer: Medicare HMO | Attending: Family Medicine | Admitting: Family Medicine

## 2020-05-05 ENCOUNTER — Ambulatory Visit (INDEPENDENT_AMBULATORY_CARE_PROVIDER_SITE_OTHER): Payer: Medicare HMO

## 2020-05-05 ENCOUNTER — Encounter (HOSPITAL_COMMUNITY): Payer: Self-pay

## 2020-05-05 DIAGNOSIS — M25512 Pain in left shoulder: Secondary | ICD-10-CM | POA: Diagnosis not present

## 2020-05-05 DIAGNOSIS — R079 Chest pain, unspecified: Secondary | ICD-10-CM

## 2020-05-05 DIAGNOSIS — M5010 Cervical disc disorder with radiculopathy, unspecified cervical region: Secondary | ICD-10-CM

## 2020-05-05 MED ORDER — METHYLPREDNISOLONE SODIUM SUCC 125 MG IJ SOLR
80.0000 mg | Freq: Once | INTRAMUSCULAR | Status: AC
  Administered 2020-05-05: 80 mg via INTRAMUSCULAR

## 2020-05-05 MED ORDER — METHYLPREDNISOLONE SODIUM SUCC 125 MG IJ SOLR
INTRAMUSCULAR | Status: AC
  Filled 2020-05-05: qty 2

## 2020-05-05 NOTE — Discharge Instructions (Addendum)
Do not believe your pain is heart or lung related. You EKG and chest x ray looked okay Believe this may be related to your rotator cuff or cervical spine. Giving you a steroid injection here today to hopefully help with the pain You can continue Tylenol Go to the ER for any continued or worsening problems

## 2020-05-05 NOTE — ED Triage Notes (Signed)
Reports over the weekend, she had some left flank pain. Now c/o chest pressure, arm tingling, and nausea that started yesterday.

## 2020-05-06 NOTE — ED Provider Notes (Signed)
[place]    CSN: [id number] Arrival date & time: 05/05/20  1446      History   Chief Complaint Chief Complaint  Patient presents with  . Chest Pain  . extremity tingling  . Nausea    HPI Pamela Roberson is a 64 y.o. female.   Patient is a 64 year old female with past medical history of arthritis, asthma, COPD, chronic back pain, diabetes, diabetic neuropathy, diverticulitis, GERD, bronchitis, high cholesterol, hypertension presents today with left shoulder pain that radiates into left chest area, left arm with some numbness, tingling in left hand and fingers.  Symptoms have been constant worsening since yesterday.  Pain is worse with movement and palpation.  Denies any cough but is worried about pneumonia due to her chest pressure and history of pneumonia with similar symptoms.  No torn rotator cuff that needs to be repaired in the left shoulder.  Previous repair of right rotator cuff.  Also history of cervical disc surgery.  No cough, shortness of breath, dizziness, lightheadedness, nausea or diaphoresis.  No palpitations.  No fevers, chills, body aches.  ROS per HPI      Past Medical History:  Diagnosis Date  . Arthritis   . Asthma   . Carotid stenosis, bilateral   . Chronic low back pain   . COPD (chronic obstructive pulmonary disease) ([place])   . Diabetes mellitus without complication ([place])    type II   . Diabetic neuropathy ([place])   . Diverticulitis   . Dyspnea    mild   . Frequent falls   . GERD (gastroesophageal reflux disease)   . History of bronchitis   . Hypercholesterolemia   . Hypertension   . IBS (irritable bowel syndrome)   . Osteoarthritis    knee  . Palpitations   . Sleep apnea    mild but no sleep apnea   . TMJ (dislocation of temporomandibular joint)   . 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 Vitamin D deficiency     Patient Active Problem List   Diagnosis Date Noted  . Dizzy 12/11/2017  . Pharyngeal dysphagia 12/11/2017   . Post-nasal drip 12/11/2017  . Referred ear pain, bilateral 12/11/2017  . Diabetes mellitus without complication ([place]) [id number]  . Family history of glaucoma 06/29/2017  . Hyperopia of both eyes with astigmatism and presbyopia 06/29/2017  . Keratoconjunctivitis sicca of both eyes not specified as Sjogren's 06/29/2017  . Nuclear sclerotic cataract of both eyes 06/29/2017  . Vitreous floater, bilateral 06/29/2017  . Biceps tendinitis of right shoulder 04/05/2017  . Impingement syndrome of right shoulder 04/05/2017  . Arthritis of right acromioclavicular joint 04/05/2017  . Partial nontraumatic tear of rotator cuff, right 04/05/2017    Past Surgical History:  Procedure Laterality Date  . ABDOMINAL HYSTERECTOMY  1993  . CARPAL TUNNEL RELEASE Right   . CERVICAL DISC SURGERY  04/29/2015   Dr Tammi Klippel, [place], [place]  . ESOPHAGOGASTRODUODENOSCOPY ENDOSCOPY     with esophagus being stretched twice per pt,   . EYE SURGERY    . 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 feeding tube placement and removal   2015  . NASAL SEPTUM SURGERY    . spurs removed from esophagus   2015  . TONSILLECTOMY      OB History   No obstetric history on file.      Home Medications    Prior to Admission medications   Medication Sig Start Date  End Date Taking? Authorizing Provider  acyclovir (ZOVIRAX) 400 MG tablet Take 400 mg by mouth 2 (two) times daily.    [provider]  albuterol (PROVENTIL HFA;VENTOLIN HFA) 108 (90 BASE) MCG/ACT inhaler Inhale 2 puffs into the lungs every 4 (four) hours as needed for wheezing or shortness of breath.     [provider]  albuterol (PROVENTIL) (2.5 MG/3ML) 0.083% nebulizer solution Take 3 mLs (2.5 mg total) by nebulization every 4 (four) hours as needed for wheezing or shortness of breath. 03/11/19   Raylene Everts, MD  ALPRAZolam Duanne Moron) 0.5 MG tablet for sedation before MRI scan; take 1 tab 1 hour before scan; may repeat 1 tab 15 min before scan  12/09/19   Penumalli, Earlean Polka, MD  aluminum-magnesium hydroxide-simethicone (MAALOX) [id number] MG/5ML SUSP Take 30 mLs by mouth 4 (four) times daily -  before meals and at bedtime. Patient taking differently: Take 30 mLs by mouth 3 (three) times daily as needed (acid reflux).  04/21/19   Zigmund Gottron, NP  Artificial Tear Ointment (DRY EYES OP) Apply 1-2 drops to eye daily as needed (for dry eyes).    [provider]  azithromycin (ZITHROMAX) 250 MG tablet Take 1 tablet (250 mg total) by mouth daily. Take first 2 tablets together, then 1 every day until finished. 12/31/19   Vanessa Kick, MD  benzonatate (TESSALON) 100 MG capsule Take 1 capsule (100 mg total) by mouth every 8 (eight) hours. 01/20/20   Deliah Boston, PA-C  cephALEXin (KEFLEX) 500 MG capsule  02/11/20   [provider]  DEXILANT 60 MG capsule 60 mg. 11/30/19   [provider]  dicyclomine (BENTYL) 20 MG tablet Take 20 mg by mouth 3 (three) times daily. 10/17/19   [provider]  EPINEPHrine 0.3 mg/0.3 mL IJ SOAJ injection Inject 0.3 mg into the muscle once as needed (allergic reactions).     [provider]  esomeprazole (NEXIUM) 40 MG capsule Take 1 capsule (40 mg total) by mouth daily. 04/24/19   Charlesetta Shanks, MD  fexofenadine (ALLEGRA) 180 MG tablet Take 1 tablet (180 mg total) by mouth daily. 09/07/19   Shelda Pal, DO  gabapentin (NEURONTIN) 300 MG capsule Take 300 mg by mouth 3 (three) times daily.    [provider]  guaiFENesin (MUCINEX) 600 MG 12 hr tablet Take 1 tablet (600 mg total) by mouth 2 (two) times daily. 04/24/19   Charlesetta Shanks, MD  ibuprofen (ADVIL) 600 MG tablet Take 1 tablet (600 mg total) by mouth every 8 (eight) hours as needed for headache. 07/13/19   Zigmund Gottron, NP  Lancets (ONETOUCH DELICA PLUS [id number]) [place]  08/15/19   [provider]  loratadine (CLARITIN) 10 MG tablet Take 10 mg by mouth daily.    [provider]  metoprolol tartrate (LOPRESSOR) 25 MG tablet Take 25 mg by mouth 2 (two) times daily.    [provider]  montelukast (SINGULAIR) 10 MG tablet Take 10 mg by mouth every morning.     [provider]  Multiple Vitamin (MULTIVITAMIN WITH MINERALS) TABS tablet Take 1 tablet by mouth daily.    [provider]  nystatin cream (MYCOSTATIN) Apply to affected area 2 times daily 07/13/19   Augusto Gamble B, NP  ondansetron (ZOFRAN ODT) 4 MG disintegrating tablet Take 1 tablet (4 mg total) by mouth every 8 (eight) hours as needed for nausea or vomiting. 03/21/20   Loura Halt A, NP  pantoprazole (PROTONIX) 40  MG tablet  11/20/19   [provider]  predniSONE (STERAPRED UNI-PAK 21 TAB) 10 MG (21) TBPK tablet Take by mouth daily. Take as directed. 12/31/19   Vanessa Kick, MD  PROCTOZONE-HC 2.5 % rectal cream APPLY AA BID FOR 14 DAYS. 05/23/19   [provider]  sitaGLIPtin-metformin (JANUMET) 50-500 MG tablet Take 1 tablet by mouth daily.    [provider]  sucralfate (CARAFATE) 1 g tablet  02/11/20   [provider]  trimethoprim-polymyxin b (POLYTRIM) ophthalmic solution Place 1 drop into the right eye every 4 (four) hours. 09/07/19   Shelda Pal, DO  Vitamins/Minerals TABS Take by mouth.    [provider]  Delfin Edis [id number] MCG/DOSE AEPB  01/18/20   [provider]  atorvastatin (LIPITOR) 10 MG tablet Take 10 mg by mouth daily.  12/09/19  [provider]  fluticasone (FLONASE) 50 MCG/ACT nasal spray Place 2 sprays into both nostrils daily. Patient taking differently: Place 2 sprays into both nostrils daily as needed for allergies.  06/08/18 12/09/19  Raylene Everts, MD    Family History Family History  Problem Relation Age of Onset  . Diabetes Father        type 2  . Heart disease Father   . Hypertension Father   . Heart disease Brother   . Hypertension Brother     Social History Social History    Tobacco Use  . Smoking status: Former Smoker    Packs/day: 0.50    Years: 30.00    Pack years: 15.00    Quit date: 12/08/1994    Years since quitting: 25.4  . Smokeless tobacco: Never Used  Substance Use Topics  . Alcohol use: No  . Drug use: No     Allergies   Crab [shellfish allergy], Erythromycin, Fish allergy, Metoclopramide, Doxycycline, Levaquin [levofloxacin in d5w], Morphine, Morphine and related, and Sulfa antibiotics   Review of Systems Review of Systems   Physical Exam Triage Vital Signs ED Triage Vitals  Enc Vitals Group     BP 05/05/20 1510 124/63     Pulse Rate 05/05/20 1510 87     Resp 05/05/20 1510 16     Temp 05/05/20 1510 98.6 F (37 C)     Temp Source 05/05/20 1510 Oral     SpO2 05/05/20 1510 100 %     Weight --      Height --      Head Circumference --      Peak Flow --      Pain Score 05/05/20 1507 8     Pain Loc --      Pain Edu? --      Excl. in [place]? --    No data found.  Updated Vital Signs BP 124/63 (BP Location: Right Arm)   Pulse 87   Temp 98.6 F (37 C) (Oral)   Resp 16   SpO2 100%   Visual Acuity Right Eye Distance:   Left Eye Distance:   Bilateral Distance:    Right Eye Near:   Left Eye Near:    Bilateral Near:     Physical Exam Vitals and nursing note reviewed.  Constitutional:      General: She is not in acute distress.    Appearance: Normal appearance. She is not ill-appearing, toxic-appearing or diaphoretic.  HENT:     Head: Normocephalic.     Nose: Nose normal.  Eyes:     Conjunctiva/sclera: Conjunctivae normal.  Cardiovascular:  Rate and Rhythm: Normal rate and regular rhythm.  Pulmonary:     Effort: Pulmonary effort is normal.     Breath sounds: Normal breath sounds.  Musculoskeletal:        General: Normal range of motion.     Cervical back: Normal range of motion.     Comments: Pain with range of motion of left shoulder and tender to palpation.  Able to perform all range of motion  exercises. Some pain with palpation of trapezius area and upper chest area. 2+ radial pulse and no swelling to the arm   Skin:    General: Skin is warm and dry.     Findings: No rash.  Neurological:     Mental Status: She is alert.  Psychiatric:        Mood and Affect: Mood normal.      UC Treatments / Results  Labs (all labs ordered are listed, but only abnormal results are displayed) Labs Reviewed - No data to display  EKG   Radiology DG Chest 2 View  Result Date: 05/05/2020 CLINICAL DATA:  Chest pain.  COPD. EXAM: CHEST - 2 VIEW COMPARISON:  03/26/2020 FINDINGS: Cervical spine fixation. Midline trachea. Normal heart size and mediastinal contours. No pleural effusion or pneumothorax. Suspect left lower lobe scarring, mild and similar over prior exams. IMPRESSION: No acute cardiopulmonary disease. Electronically Signed   By: Abigail Miyamoto M.D.   On: 05/05/2020 16:46    Procedures Procedures (including critical care time)  Medications Ordered in UC Medications  methylPREDNISolone sodium succinate (SOLU-MEDROL) 125 mg/2 mL injection 80 mg (80 mg Intramuscular Given 05/05/20 1644)    Initial Impression / Assessment and Plan / UC Course  I have reviewed the triage vital signs and the nursing notes.  Pertinent labs & imaging results that were available during my care of the patient were reviewed by me and considered in my medical decision making (see chart for details).     Shoulder pain Most likely due to rotator cuff issue.  This is most likely the cause of her chest pain with radiation from shoulder. Believe she is also having some cervical radiculopathy. EKG with normal sinus rhythm and left anterior fascicular block. No concern for ACS at this time. X-ray without any concerns of pneumonia or other etiology. Treating her symptoms with a steroid injection here in clinic today. She can continue the Tylenol as needed Recommended follow-up with orthopedics or go to the ER  for any continued or worsening symptoms to include chest pain, shortness of breath ROS per HPI  Final Clinical Impressions(s) / UC Diagnoses   Final diagnoses:  Pain in joint of left shoulder  Chest pain, unspecified type  Cervical disc disorder with radiculopathy of cervical region     Discharge Instructions     Do not believe your pain is heart or lung related. You EKG and chest x ray looked okay Believe this may be related to your rotator cuff or cervical spine. Giving you a steroid injection here today to hopefully help with the pain You can continue Tylenol Go to the ER for any continued or worsening problems    ED Prescriptions    None     PDMP not reviewed this encounter.   Orvan July, NP 05/06/20 [id number]

## 2020-05-12 ENCOUNTER — Other Ambulatory Visit: Payer: Self-pay | Admitting: Gastroenterology

## 2020-05-12 ENCOUNTER — Ambulatory Visit
Admission: RE | Admit: 2020-05-12 | Discharge: 2020-05-12 | Disposition: A | Discharge status: Home / Self Care | Payer: Medicare HMO | Source: Ambulatory Visit | Attending: Gastroenterology | Admitting: Gastroenterology

## 2020-05-12 DIAGNOSIS — R1011 Right upper quadrant pain: Secondary | ICD-10-CM

## 2020-05-12 DIAGNOSIS — R1310 Dysphagia, unspecified: Secondary | ICD-10-CM

## 2020-05-14 ENCOUNTER — Encounter (HOSPITAL_COMMUNITY): Payer: Self-pay

## 2020-05-14 ENCOUNTER — Other Ambulatory Visit: Payer: Self-pay

## 2020-05-14 ENCOUNTER — Ambulatory Visit (HOSPITAL_COMMUNITY)
Admission: EM | Admit: 2020-05-14 | Discharge: 2020-05-14 | Disposition: A | Discharge status: Home / Self Care | Payer: Medicare HMO | Attending: Family Medicine | Admitting: Family Medicine

## 2020-05-14 DIAGNOSIS — Z8249 Family history of ischemic heart disease and other diseases of the circulatory system: Secondary | ICD-10-CM | POA: Insufficient documentation

## 2020-05-14 DIAGNOSIS — Z791 Long term (current) use of non-steroidal anti-inflammatories (NSAID): Secondary | ICD-10-CM | POA: Insufficient documentation

## 2020-05-14 DIAGNOSIS — Z833 Family history of diabetes mellitus: Secondary | ICD-10-CM | POA: Diagnosis not present

## 2020-05-14 DIAGNOSIS — E1140 Type 2 diabetes mellitus with diabetic neuropathy, unspecified: Secondary | ICD-10-CM | POA: Insufficient documentation

## 2020-05-14 DIAGNOSIS — Z881 Allergy status to other antibiotic agents status: Secondary | ICD-10-CM | POA: Diagnosis not present

## 2020-05-14 DIAGNOSIS — K589 Irritable bowel syndrome without diarrhea: Secondary | ICD-10-CM | POA: Diagnosis not present

## 2020-05-14 DIAGNOSIS — K219 Gastro-esophageal reflux disease without esophagitis: Secondary | ICD-10-CM | POA: Diagnosis not present

## 2020-05-14 DIAGNOSIS — Z79899 Other long term (current) drug therapy: Secondary | ICD-10-CM | POA: Insufficient documentation

## 2020-05-14 DIAGNOSIS — R059 Cough, unspecified: Secondary | ICD-10-CM

## 2020-05-14 DIAGNOSIS — I10 Essential (primary) hypertension: Secondary | ICD-10-CM | POA: Diagnosis not present

## 2020-05-14 DIAGNOSIS — E7800 Pure hypercholesterolemia, unspecified: Secondary | ICD-10-CM | POA: Insufficient documentation

## 2020-05-14 DIAGNOSIS — G4730 Sleep apnea, unspecified: Secondary | ICD-10-CM | POA: Insufficient documentation

## 2020-05-14 DIAGNOSIS — J449 Chronic obstructive pulmonary disease, unspecified: Secondary | ICD-10-CM | POA: Insufficient documentation

## 2020-05-14 DIAGNOSIS — Z7952 Long term (current) use of systemic steroids: Secondary | ICD-10-CM | POA: Diagnosis not present

## 2020-05-14 DIAGNOSIS — R05 Cough: Secondary | ICD-10-CM | POA: Diagnosis not present

## 2020-05-14 DIAGNOSIS — Z20822 Contact with and (suspected) exposure to covid-19: Secondary | ICD-10-CM | POA: Diagnosis not present

## 2020-05-14 DIAGNOSIS — Z9109 Other allergy status, other than to drugs and biological substances: Secondary | ICD-10-CM | POA: Diagnosis not present

## 2020-05-14 DIAGNOSIS — Z882 Allergy status to sulfonamides status: Secondary | ICD-10-CM | POA: Diagnosis not present

## 2020-05-14 DIAGNOSIS — M1990 Unspecified osteoarthritis, unspecified site: Secondary | ICD-10-CM | POA: Insufficient documentation

## 2020-05-14 DIAGNOSIS — Z7984 Long term (current) use of oral hypoglycemic drugs: Secondary | ICD-10-CM | POA: Insufficient documentation

## 2020-05-14 DIAGNOSIS — R0981 Nasal congestion: Secondary | ICD-10-CM | POA: Diagnosis not present

## 2020-05-14 DIAGNOSIS — Z87891 Personal history of nicotine dependence: Secondary | ICD-10-CM | POA: Diagnosis not present

## 2020-05-14 DIAGNOSIS — Z885 Allergy status to narcotic agent status: Secondary | ICD-10-CM | POA: Diagnosis not present

## 2020-05-14 MED ORDER — BENZONATATE 100 MG PO CAPS: 100.0000 mg | ORAL_CAPSULE | Freq: Three times a day (TID) | ORAL | 0 refills | Status: DC | PRN

## 2020-05-14 MED ORDER — PROMETHAZINE-DM 6.25-15 MG/5ML PO SYRP
5.0000 mL | ORAL_SOLUTION | Freq: Every evening | ORAL | 0 refills | Status: DC | PRN
Start: 2020-05-14 — End: 2020-08-06

## 2020-05-14 NOTE — ED Triage Notes (Signed)
Pt presents for covid testing with no known symptoms. 

## 2020-05-14 NOTE — Discharge Instructions (Addendum)
We will notify you of your COVID-19 test results as they arrive and may take between 24 to 48 hours.  I encourage you to sign up for MyChart if you have not already done so as this can be the easiest way for [place] to communicate results to you online or through a phone app.  In the meantime, if you develop worsening symptoms including fever, chest pain, shortness of breath despite our current treatment plan then please report to the emergency room as this may be a sign of worsening status from possible COVID-19 infection.  For sore throat or cough try using a honey-based tea. Use 3 teaspoons of honey with juice squeezed from half lemon. Place shaved pieces of ginger into 1/2-1 cup of water and warm over stove top. Then mix the ingredients and repeat every 4 hours as needed. Please take Tylenol 500mg -650mg  every 6 hours for aches and pains, fevers. Hydrate very well with at least 2 liters of water. Eat light meals such as soups to replenish electrolytes and soft fruits, veggies. Start an antihistamine like Zyrtec, Allegra or Claritin for postnasal drainage, sinus congestion.

## 2020-05-14 NOTE — ED Provider Notes (Signed)
Pamela Roberson   MRN: [id number] DOB: [birth year]  Subjective:   Pamela Roberson is a 64 y.o. female presenting for Covid screening.  Patient has been taking care of her granddaughter who started to have symptoms of diarrhea and general malaise this past week.  Patient herself has had chronic congestion and a chronic cough, has COPD.  Has not noticed any particular change in her symptoms.  No current facility-administered medications for this encounter.  Current Outpatient Medications:  .  acyclovir (ZOVIRAX) 400 MG tablet, Take 400 mg by mouth 2 (two) times daily., Disp: , Rfl:  .  albuterol (PROVENTIL HFA;VENTOLIN HFA) 108 (90 BASE) MCG/ACT inhaler, Inhale 2 puffs into the lungs every 4 (four) hours as needed for wheezing or shortness of breath. , Disp: , Rfl:  .  albuterol (PROVENTIL) (2.5 MG/3ML) 0.083% nebulizer solution, Take 3 mLs (2.5 mg total) by nebulization every 4 (four) hours as needed for wheezing or shortness of breath., Disp: 30 vial, Rfl: 0 .  ALPRAZolam (XANAX) 0.5 MG tablet, for sedation before MRI scan; take 1 tab 1 hour before scan; may repeat 1 tab 15 min before scan, Disp: 3 tablet, Rfl: 0 .  aluminum-magnesium hydroxide-simethicone (MAALOX) 283-151-76 MG/5ML SUSP, Take 30 mLs by mouth 4 (four) times daily -  before meals and at bedtime. (Patient taking differently: Take 30 mLs by mouth 3 (three) times daily as needed (acid reflux). ), Disp: 710 mL, Rfl: 0 .  Artificial Tear Ointment (DRY EYES OP), Apply 1-2 drops to eye daily as needed (for dry eyes)., Disp: , Rfl:  .  azithromycin (ZITHROMAX) 250 MG tablet, Take 1 tablet (250 mg total) by mouth daily. Take first 2 tablets together, then 1 every day until finished., Disp: 6 tablet, Rfl: 0 .  benzonatate (TESSALON) 100 MG capsule, Take 1 capsule (100 mg total) by mouth every 8 (eight) hours., Disp: 21 capsule, Rfl: 0 .  cephALEXin (KEFLEX) 500 MG capsule, , Disp: , Rfl:  .  DEXILANT 60 MG capsule, 60 mg., Disp: ,  Rfl:  .  dicyclomine (BENTYL) 20 MG tablet, Take 20 mg by mouth 3 (three) times daily., Disp: , Rfl:  .  EPINEPHrine 0.3 mg/0.3 mL IJ SOAJ injection, Inject 0.3 mg into the muscle once as needed (allergic reactions). , Disp: , Rfl:  .  esomeprazole (NEXIUM) 40 MG capsule, Take 1 capsule (40 mg total) by mouth daily., Disp: 30 capsule, Rfl: 0 .  fexofenadine (ALLEGRA) 180 MG tablet, Take 1 tablet (180 mg total) by mouth daily., Disp: 30 tablet, Rfl: 1 .  gabapentin (NEURONTIN) 300 MG capsule, Take 300 mg by mouth 3 (three) times daily., Disp: , Rfl:  .  guaiFENesin (MUCINEX) 600 MG 12 hr tablet, Take 1 tablet (600 mg total) by mouth 2 (two) times daily., Disp: 30 tablet, Rfl: 1 .  ibuprofen (ADVIL) 600 MG tablet, Take 1 tablet (600 mg total) by mouth every 8 (eight) hours as needed for headache., Disp: 30 tablet, Rfl: 0 .  Lancets (ONETOUCH DELICA PLUS [id number]) MISC, , Disp: , Rfl:  .  loratadine (CLARITIN) 10 MG tablet, Take 10 mg by mouth daily., Disp: , Rfl:  .  metoprolol tartrate (LOPRESSOR) 25 MG tablet, Take 25 mg by mouth 2 (two) times daily., Disp: , Rfl:  .  montelukast (SINGULAIR) 10 MG tablet, Take 10 mg by mouth every morning. , Disp: , Rfl:  .  Multiple Vitamin (MULTIVITAMIN WITH MINERALS) TABS tablet, Take 1 tablet by mouth daily., Disp: ,  Rfl:  .  nystatin cream (MYCOSTATIN), Apply to affected area 2 times daily, Disp: 30 g, Rfl: 0 .  ondansetron (ZOFRAN ODT) 4 MG disintegrating tablet, Take 1 tablet (4 mg total) by mouth every 8 (eight) hours as needed for nausea or vomiting., Disp: 20 tablet, Rfl: 0 .  pantoprazole (PROTONIX) 40 MG tablet, , Disp: , Rfl:  .  predniSONE (STERAPRED UNI-PAK 21 TAB) 10 MG (21) TBPK tablet, Take by mouth daily. Take as directed., Disp: 21 tablet, Rfl: 0 .  PROCTOZONE-HC 2.5 % rectal cream, APPLY AA BID FOR 14 DAYS., Disp: , Rfl:  .  sitaGLIPtin-metformin (JANUMET) 50-500 MG tablet, Take 1 tablet by mouth daily., Disp: , Rfl:  .  sucralfate (CARAFATE)  1 g tablet, , Disp: , Rfl:  .  trimethoprim-polymyxin b (POLYTRIM) ophthalmic solution, Place 1 drop into the right eye every 4 (four) hours., Disp: 10 mL, Rfl: 0 .  Vitamins/Minerals TABS, Take by mouth., Disp: , Rfl:  .  WIXELA INHUB 250-50 MCG/DOSE AEPB, , Disp: , Rfl:    Allergies  Allergen Reactions  . Crab [Shellfish Allergy] Shortness Of Breath and Swelling  . Erythromycin Swelling and Rash  . Fish Allergy     Swelling   . Metoclopramide Other (See Comments)    Slurred speech and unable to move muscles,  Caused her to drag her feet  . Doxycycline     GI Intolerance  . Levaquin [Levofloxacin In D5w] Other (See Comments)    Causes irregular heart beat  . Morphine Nausea And Vomiting and Rash    Irregular heart beat  . Morphine And Related Nausea And Vomiting and Rash    Irregular heart beat  . Sulfa Antibiotics Rash    Past Medical History:  Diagnosis Date  . Arthritis   . Asthma   . Carotid stenosis, bilateral   . Chronic low back pain   . COPD (chronic obstructive pulmonary disease) ([place])   . Diabetes mellitus without complication ([place])    type II   . Diabetic neuropathy ([place])   . Diverticulitis   . Dyspnea    mild   . Frequent falls   . GERD (gastroesophageal reflux disease)   . History of bronchitis   . Hypercholesterolemia   . Hypertension   . IBS (irritable bowel syndrome)   . Osteoarthritis    knee  . Palpitations   . Sleep apnea    mild but no sleep apnea   . TMJ (dislocation of temporomandibular joint)   . 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 Vitamin D deficiency      Past Surgical History:  Procedure Laterality Date  . ABDOMINAL HYSTERECTOMY  1993  . CARPAL TUNNEL RELEASE Right   . CERVICAL DISC SURGERY  04/29/2015   Dr Tammi Klippel, [place], [place]  . ESOPHAGOGASTRODUODENOSCOPY ENDOSCOPY     with esophagus being stretched twice per pt,   . EYE SURGERY    . 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 feeding tube placement and removal   2015    . NASAL SEPTUM SURGERY    . spurs removed from esophagus   2015  . TONSILLECTOMY      Family History  Problem Relation Age of Onset  . Diabetes Father        type 2  . Heart disease Father   . Hypertension Father   . Heart disease Brother   . Hypertension Brother  Social History   Tobacco Use  . Smoking status: Former Smoker    Packs/day: 0.50    Years: 30.00    Pack years: 15.00    Quit date: 12/08/1994    Years since quitting: 25.4  . Smokeless tobacco: Never Used  Vaping Use  . Vaping Use: Never used  Substance Use Topics  . Alcohol use: No  . Drug use: No    ROS   Objective:   Vitals: BP (!) 149/76 (BP Location: Right Arm)   Pulse 71   Temp 98.4 F (36.9 C) (Oral)   Resp 17   SpO2 100%   Physical Exam Constitutional:      General: She is not in acute distress.    Appearance: Normal appearance. She is well-developed. She is not ill-appearing, toxic-appearing or diaphoretic.  HENT:     Head: Normocephalic and atraumatic.     Nose: Nose normal.     Mouth/Throat:     Mouth: Mucous membranes are moist.  Eyes:     Extraocular Movements: Extraocular movements intact.     Pupils: Pupils are equal, round, and reactive to light.  Cardiovascular:     Rate and Rhythm: Normal rate and regular rhythm.     Pulses: Normal pulses.     Heart sounds: Normal heart sounds. No murmur heard.  No friction rub. No gallop.   Pulmonary:     Effort: Pulmonary effort is normal. No respiratory distress.     Breath sounds: Normal breath sounds. No stridor. No wheezing, rhonchi or rales.  Skin:    General: Skin is warm and dry.     Findings: No rash.  Neurological:     Mental Status: She is alert and oriented to person, place, and time.  Psychiatric:        Mood and Affect: Mood normal.        Behavior: Behavior normal.        Thought Content: Thought content normal.        Judgment: Judgment normal.     Assessment and Plan :   PDMP not reviewed this  encounter.  1. Cough   2. Nasal congestion   3. Chronic obstructive pulmonary disease, unspecified COPD type ([place])   4. Environmental allergies     Recommend supportive care.  COVID-19 testing pending.  Maintain chronic medications. Counseled patient on potential for adverse effects with medications prescribed/recommended today, ER and return-to-clinic precautions discussed, patient verbalized understanding.    Jaynee Eagles, [place] 05/14/20 2024

## 2020-05-15 LAB — SARS CORONAVIRUS 2 (TAT 6-24 HRS): SARS Coronavirus 2: NEGATIVE

## 2020-05-22 ENCOUNTER — Emergency Department (HOSPITAL_COMMUNITY)
Admission: EM | Admit: 2020-05-22 | Discharge: 2020-05-23 | Discharge status: Against Medical Advice | Payer: Medicare HMO | Attending: Emergency Medicine | Admitting: Emergency Medicine

## 2020-05-22 ENCOUNTER — Other Ambulatory Visit: Payer: Self-pay

## 2020-05-22 ENCOUNTER — Encounter (HOSPITAL_COMMUNITY): Payer: Self-pay

## 2020-05-22 ENCOUNTER — Emergency Department (HOSPITAL_COMMUNITY): Payer: Medicare HMO

## 2020-05-22 DIAGNOSIS — Z79899 Other long term (current) drug therapy: Secondary | ICD-10-CM | POA: Insufficient documentation

## 2020-05-22 DIAGNOSIS — E119 Type 2 diabetes mellitus without complications: Secondary | ICD-10-CM | POA: Insufficient documentation

## 2020-05-22 DIAGNOSIS — J449 Chronic obstructive pulmonary disease, unspecified: Secondary | ICD-10-CM | POA: Insufficient documentation

## 2020-05-22 DIAGNOSIS — Z7984 Long term (current) use of oral hypoglycemic drugs: Secondary | ICD-10-CM | POA: Insufficient documentation

## 2020-05-22 DIAGNOSIS — I10 Essential (primary) hypertension: Secondary | ICD-10-CM | POA: Diagnosis not present

## 2020-05-22 DIAGNOSIS — Z87891 Personal history of nicotine dependence: Secondary | ICD-10-CM | POA: Insufficient documentation

## 2020-05-22 DIAGNOSIS — J069 Acute upper respiratory infection, unspecified: Secondary | ICD-10-CM

## 2020-05-22 DIAGNOSIS — R05 Cough: Secondary | ICD-10-CM | POA: Diagnosis present

## 2020-05-22 LAB — CBC
HCT: 43 % (ref 36.0–46.0)
Hemoglobin: 13.8 g/dL (ref 12.0–15.0)
MCH: 28.9 pg (ref 26.0–34.0)
MCHC: 32.1 g/dL (ref 30.0–36.0)
MCV: 90.1 fL (ref 80.0–100.0)
Platelets: 231 10*3/uL (ref 150–400)
RBC: 4.77 MIL/uL (ref 3.87–5.11)
RDW: 13 % (ref 11.5–15.5)
WBC: 7.8 10*3/uL (ref 4.0–10.5)
nRBC: 0 % (ref 0.0–0.2)

## 2020-05-22 NOTE — ED Triage Notes (Addendum)
Pt arrives POV for eval of URI sx onset over a week ago. States was tested for covid at [facility] w/ negative results. Pt reports her entire family is sick w/ same sx. Does report some epigastric discomfort

## 2020-05-23 DIAGNOSIS — J069 Acute upper respiratory infection, unspecified: Secondary | ICD-10-CM | POA: Diagnosis not present

## 2020-05-23 LAB — BASIC METABOLIC PANEL
Anion gap: 10 (ref 5–15)
BUN: 9 mg/dL (ref 8–23)
CO2: 23 mmol/L (ref 22–32)
Calcium: 8.9 mg/dL (ref 8.9–10.3)
Chloride: 104 mmol/L (ref 98–111)
Creatinine, Ser: 0.98 mg/dL (ref 0.44–1.00)
GFR calc Af Amer: >60 mL/min (ref >60)
GFR calc non Af Amer: >60 mL/min (ref >60)
Glucose, Bld: 159 mg/dL — ABNORMAL HIGH (ref 70–99)
Potassium: 3.8 mmol/L (ref 3.5–5.1)
Sodium: 137 mmol/L (ref 135–145)

## 2020-05-23 LAB — TROPONIN I (HIGH SENSITIVITY)
Troponin I (High Sensitivity): <2 ng/L (ref <18)
Troponin I (High Sensitivity): 6 ng/L (ref <18)

## 2020-05-23 NOTE — ED Notes (Signed)
Pt told EDP she wanted to signs outr & leave, nurse at bedside now per EDP to get an [place].

## 2020-05-23 NOTE — Discharge Instructions (Signed)
You were seen today for upper respiratory infection, we did not get to evaluate you since you wanted to leave.  This was AGAINST MEDICAL ADVICE.  Feel free to come back to the emergency room if you have any new or worsening concerning symptoms or want a work-up.

## 2020-05-23 NOTE — ED Provider Notes (Signed)
[facility] EMERGENCY DEPARTMENT Provider Note   CSN: [id number] Arrival date & time: 05/22/20  2258     History Chief Complaint  Patient presents with  . Cough  . Sore Throat    Pamela Roberson is a 64 y.o. female with pertinent past medical history of asthma, COPD, diabetes, hypertension that presents the emergency department today for URI-like symptoms.  As sooon as I walked into the room she states that she had to go pick up her granddaughters.  States that she has been waiting for 1/2 hours, states that she needs to leave immediately.  Quickly expressed that she has been having URI symptoms for a week, got tested at urgent care and was negative for Covid.  Did not get her Covid vaccines.  States that her URI symptoms have been getting worse and wanted to just know that her blood work was normal.  Did not express more than this, states that she is also been having some blood tinged sputum.  Denies any chest pain.  As mentioned above, she did not express more than this and states that she had to go.  HPI     Past Medical History:  Diagnosis Date  . Arthritis   . Asthma   . Carotid stenosis, bilateral   . Chronic low back pain   . COPD (chronic obstructive pulmonary disease) ([place])   . Diabetes mellitus without complication ([place])    type II   . Diabetic neuropathy ([place])   . Diverticulitis   . Dyspnea    mild   . Frequent falls   . GERD (gastroesophageal reflux disease)   . History of bronchitis   . Hypercholesterolemia   . Hypertension   . IBS (irritable bowel syndrome)   . Osteoarthritis    knee  . Palpitations   . Sleep apnea    mild but no sleep apnea   . TMJ (dislocation of temporomandibular joint)   . 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 Vitamin D deficiency     Patient Active Problem List   Diagnosis Date Noted  . Dizzy 12/11/2017  . Pharyngeal dysphagia 12/11/2017  . Post-nasal drip 12/11/2017  . Referred ear pain, bilateral  12/11/2017  . Diabetes mellitus without complication ([place]) 15/40/0867  . Family history of glaucoma 06/29/2017  . Hyperopia of both eyes with astigmatism and presbyopia 06/29/2017  . Keratoconjunctivitis sicca of both eyes not specified as Sjogren's 06/29/2017  . Nuclear sclerotic cataract of both eyes 06/29/2017  . Vitreous floater, bilateral 06/29/2017  . Biceps tendinitis of right shoulder 04/05/2017  . Impingement syndrome of right shoulder 04/05/2017  . Arthritis of right acromioclavicular joint 04/05/2017  . Partial nontraumatic tear of rotator cuff, right 04/05/2017    Past Surgical History:  Procedure Laterality Date  . ABDOMINAL HYSTERECTOMY  1993  . CARPAL TUNNEL RELEASE Right   . CERVICAL DISC SURGERY  04/29/2015   Dr Tammi Klippel, [place], [place]  . ESOPHAGOGASTRODUODENOSCOPY ENDOSCOPY     with esophagus being stretched twice per pt,   . EYE SURGERY    . 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 feeding tube placement and removal   2015  . NASAL SEPTUM SURGERY    . spurs removed from esophagus   2015  . TONSILLECTOMY       OB History   No obstetric history on file.     Family History  Problem Relation Age of Onset  . Diabetes  Father        type 2  . Heart disease Father   . Hypertension Father   . Heart disease Brother   . Hypertension Brother     Social History   Tobacco Use  . Smoking status: Former Smoker    Packs/day: 0.50    Years: 30.00    Pack years: 15.00    Quit date: 12/08/1994    Years since quitting: 25.4  . Smokeless tobacco: Never Used  Vaping Use  . Vaping Use: Never used  Substance Use Topics  . Alcohol use: No  . Drug use: No    Home Medications Prior to Admission medications   Medication Sig Start Date End Date Taking? Authorizing Provider  acyclovir (ZOVIRAX) 400 MG tablet Take 400 mg by mouth 2 (two) times daily.    [provider]  albuterol (PROVENTIL HFA;VENTOLIN HFA) 108 (90 BASE) MCG/ACT inhaler Inhale 2 puffs into  the lungs every 4 (four) hours as needed for wheezing or shortness of breath.     [provider]  albuterol (PROVENTIL) (2.5 MG/3ML) 0.083% nebulizer solution Take 3 mLs (2.5 mg total) by nebulization every 4 (four) hours as needed for wheezing or shortness of breath. 03/11/19   Raylene Everts, MD  ALPRAZolam Duanne Moron) 0.5 MG tablet for sedation before MRI scan; take 1 tab 1 hour before scan; may repeat 1 tab 15 min before scan 12/09/19   Penumalli, Earlean Polka, MD  aluminum-magnesium hydroxide-simethicone (MAALOX) 222-979-89 MG/5ML SUSP Take 30 mLs by mouth 4 (four) times daily -  before meals and at bedtime. Patient taking differently: Take 30 mLs by mouth 3 (three) times daily as needed (acid reflux).  04/21/19   Zigmund Gottron, NP  Artificial Tear Ointment (DRY EYES OP) Apply 1-2 drops to eye daily as needed (for dry eyes).    [provider]  azithromycin (ZITHROMAX) 250 MG tablet Take 1 tablet (250 mg total) by mouth daily. Take first 2 tablets together, then 1 every day until finished. 12/31/19   Vanessa Kick, MD  benzonatate (TESSALON) 100 MG capsule Take 1-2 capsules (100-200 mg total) by mouth 3 (three) times daily as needed. 05/14/20   Jaynee Eagles, PA-C  cephALEXin [facility]) 500 MG capsule  02/11/20   [provider]  DEXILANT 60 MG capsule 60 mg. 11/30/19   [provider]  dicyclomine (BENTYL) 20 MG tablet Take 20 mg by mouth 3 (three) times daily. 10/17/19   [provider]  EPINEPHrine 0.3 mg/0.3 mL IJ SOAJ injection Inject 0.3 mg into the muscle once as needed (allergic reactions).     [provider]  esomeprazole (NEXIUM) 40 MG capsule Take 1 capsule (40 mg total) by mouth daily. 04/24/19   Charlesetta Shanks, MD  fexofenadine (ALLEGRA) 180 MG tablet Take 1 tablet (180 mg total) by mouth daily. 09/07/19   Shelda Pal, DO  gabapentin (NEURONTIN) 300 MG capsule Take 300 mg by mouth 3 (three) times daily.    [provider]    guaiFENesin (MUCINEX) 600 MG 12 hr tablet Take 1 tablet (600 mg total) by mouth 2 (two) times daily. 04/24/19   Charlesetta Shanks, MD  ibuprofen (ADVIL) 600 MG tablet Take 1 tablet (600 mg total) by mouth every 8 (eight) hours as needed for headache. 07/13/19   Zigmund Gottron, NP  Lancets (ONETOUCH DELICA PLUS [id number]) [place]  08/15/19   [provider]  loratadine (CLARITIN) 10 MG tablet Take 10 mg by mouth daily.  [provider]  metoprolol tartrate (LOPRESSOR) 25 MG tablet Take 25 mg by mouth 2 (two) times daily.    [provider]  montelukast (SINGULAIR) 10 MG tablet Take 10 mg by mouth every morning.     [provider]  Multiple Vitamin (MULTIVITAMIN WITH MINERALS) TABS tablet Take 1 tablet by mouth daily.    [provider]  nystatin cream (MYCOSTATIN) Apply to affected area 2 times daily 07/13/19   Augusto Gamble B, NP  ondansetron (ZOFRAN ODT) 4 MG disintegrating tablet Take 1 tablet (4 mg total) by mouth every 8 (eight) hours as needed for nausea or vomiting. 03/21/20   Loura Halt A, NP  pantoprazole (PROTONIX) 40 MG tablet  11/20/19   [provider]  predniSONE (STERAPRED UNI-PAK 21 TAB) 10 MG (21) TBPK tablet Take by mouth daily. Take as directed. 12/31/19   Vanessa Kick, MD  PROCTOZONE-HC 2.5 % rectal cream APPLY AA BID FOR 14 DAYS. 05/23/19   [provider]  promethazine-dextromethorphan (PROMETHAZINE-DM) 6.25-15 MG/5ML syrup Take 5 mLs by mouth at bedtime as needed for cough. 05/14/20   Jaynee Eagles, PA-C  sitaGLIPtin-metformin (JANUMET) 50-500 MG tablet Take 1 tablet by mouth daily.    [provider]  sucralfate (CARAFATE) 1 g tablet  02/11/20   [provider]  trimethoprim-polymyxin b (POLYTRIM) ophthalmic solution Place 1 drop into the right eye every 4 (four) hours. 09/07/19   Shelda Pal, DO  Vitamins/Minerals TABS Take by mouth.    [provider]  Delfin Edis 191-47 MCG/DOSE  AEPB  01/18/20   [provider]  atorvastatin (LIPITOR) 10 MG tablet Take 10 mg by mouth daily.  12/09/19  [provider]  fluticasone (FLONASE) 50 MCG/ACT nasal spray Place 2 sprays into both nostrils daily. Patient taking differently: Place 2 sprays into both nostrils daily as needed for allergies.  06/08/18 12/09/19  Raylene Everts, MD    Allergies    Otho Darner allergy], Erythromycin, Fish allergy, Metoclopramide, Doxycycline, Levaquin [levofloxacin in d5w], Morphine, Morphine and related, and Sulfa antibiotics  Review of Systems   Review of Systems  Constitutional: Negative for chills, diaphoresis, fatigue and fever.  HENT: Positive for congestion and sore throat. Negative for sinus pressure and trouble swallowing.   Eyes: Negative for pain and visual disturbance.  Respiratory: Positive for cough. Negative for shortness of breath and wheezing.   Cardiovascular: Negative for chest pain, palpitations and leg swelling.  Gastrointestinal: Negative for abdominal distention, abdominal pain, diarrhea, nausea and vomiting.  Genitourinary: Negative for difficulty urinating.  Musculoskeletal: Negative for back pain, neck pain and neck stiffness.  Skin: Negative for pallor.  Neurological: Negative for dizziness, speech difficulty, weakness and headaches.  Psychiatric/Behavioral: Negative for confusion.    Physical Exam Updated Vital Signs BP 116/76 (BP Location: Left Arm)   Pulse 86   Temp 98.9 F (37.2 C) (Oral)   Resp 18   Ht 5\' 7"  (1.702 m)   Wt 89.4 kg   SpO2 100%   BMI 30.85 kg/m   Physical Exam Constitutional:      General: She is not in acute distress.    Appearance: Normal appearance. She is not ill-appearing, toxic-appearing or diaphoretic.  HENT:     Head: Normocephalic and atraumatic.     Comments: Was not able to assess the inside of her mouth since patient wanted to leave, patient is not in any respiratory distress.  No stridor, no drooling, no  trismus.  Patient is able to  speak to me in full sentences.  Handling secretions well.  No muffled or hot potato voice. Cardiovascular:     Rate and Rhythm: Normal rate.     Pulses: Normal pulses.  Pulmonary:     Effort: Pulmonary effort is normal.  Musculoskeletal:        General: Normal range of motion.     Cervical back: Normal range of motion.  Skin:    General: Skin is warm and dry.  Neurological:     General: No focal deficit present.     Mental Status: She is alert and oriented to person, place, and time.  Psychiatric:        Mood and Affect: Mood normal.        Behavior: Behavior normal.        Thought Content: Thought content normal.     ED Results / Procedures / Treatments   Labs (all labs ordered are listed, but only abnormal results are displayed) Labs Reviewed  BASIC METABOLIC PANEL - Abnormal; Notable for the following components:      Result Value   Glucose, Bld 159 (*)    All other components within normal limits  CBC  TROPONIN I (HIGH SENSITIVITY)  TROPONIN I (HIGH SENSITIVITY)    EKG None  Radiology DG Chest 2 View  Result Date: 05/22/2020 CLINICAL DATA:  Right-sided chest pain. EXAM: CHEST - 2 VIEW COMPARISON:  None. FINDINGS: There is no evidence of acute infiltrate, pleural effusion or pneumothorax. The heart size and mediastinal contours are within normal limits. Radiopaque operative material is seen overlying the lower cervical spine. The visualized skeletal structures are otherwise unremarkable. IMPRESSION: No active cardiopulmonary disease. Electronically Signed   By: Virgina Norfolk M.D.   On: 05/22/2020 23:34    Procedures Procedures (including critical care time)  Medications Ordered in ED Medications - No data to display  ED Course  I have reviewed the triage vital signs and the nursing notes.  Pertinent labs & imaging results that were available during my care of the patient were reviewed by me and considered in my medical decision  making (see chart for details).    MDM Rules/Calculators/A&P                         Pamela Roberson is a 64 y.o. female with pertinent past medical history of asthma, COPD, diabetes, hypertension that presents the emergency department today for URI-like symptoms.  Patient is not in any acute respiratory distress, soon as I entered the room patient states that she wanted to leave.  States that she wanted to just know about her labs, states that she has to go pick up her grandchildren.  CBC and CBC stable, chest x-ray negative for acute cardiopulmonary disease.  EKG without ischemia.  I expressed all this to her and explained that I would like to talk to her more about her illness, she states that she will have to come back.  Patient is able to talk to me in full sentences without stridor, drooling, trismus, muffled voice.  Vitals are stable.  Patient left AGAINST MEDICAL ADVICE.  Expressed that patient can come back to the emergency room whenever she wants, expressed that she needs to come back if she has any new or worsening concerning symptoms.  Patient agreeable.  Final Clinical Impression(s) / ED Diagnoses Final diagnoses:  Viral upper respiratory tract infection    Rx / DC Orders ED Discharge Orders    None  Alfredia Client, PA-C 05/23/20 1658    Little, Wenda Overland, MD 05/23/20 1059

## 2020-05-26 ENCOUNTER — Ambulatory Visit: Payer: Medicare HMO | Admitting: Orthotics

## 2020-05-26 ENCOUNTER — Ambulatory Visit: Payer: Medicare HMO | Admitting: Podiatry

## 2020-06-14 ENCOUNTER — Encounter (HOSPITAL_COMMUNITY): Payer: Self-pay

## 2020-06-14 ENCOUNTER — Ambulatory Visit (HOSPITAL_COMMUNITY)
Admission: EM | Admit: 2020-06-14 | Discharge: 2020-06-14 | Disposition: A | Discharge status: Home / Self Care | Payer: Medicare HMO | Attending: Family Medicine | Admitting: Family Medicine

## 2020-06-14 ENCOUNTER — Other Ambulatory Visit: Payer: Self-pay

## 2020-06-14 DIAGNOSIS — Z833 Family history of diabetes mellitus: Secondary | ICD-10-CM | POA: Insufficient documentation

## 2020-06-14 DIAGNOSIS — K219 Gastro-esophageal reflux disease without esophagitis: Secondary | ICD-10-CM | POA: Insufficient documentation

## 2020-06-14 DIAGNOSIS — I10 Essential (primary) hypertension: Secondary | ICD-10-CM | POA: Insufficient documentation

## 2020-06-14 DIAGNOSIS — Z885 Allergy status to narcotic agent status: Secondary | ICD-10-CM | POA: Diagnosis not present

## 2020-06-14 DIAGNOSIS — Z882 Allergy status to sulfonamides status: Secondary | ICD-10-CM | POA: Diagnosis not present

## 2020-06-14 DIAGNOSIS — Z20822 Contact with and (suspected) exposure to covid-19: Secondary | ICD-10-CM | POA: Insufficient documentation

## 2020-06-14 DIAGNOSIS — J449 Chronic obstructive pulmonary disease, unspecified: Secondary | ICD-10-CM | POA: Diagnosis not present

## 2020-06-14 DIAGNOSIS — Z87891 Personal history of nicotine dependence: Secondary | ICD-10-CM | POA: Insufficient documentation

## 2020-06-14 DIAGNOSIS — G4730 Sleep apnea, unspecified: Secondary | ICD-10-CM | POA: Diagnosis not present

## 2020-06-14 DIAGNOSIS — R0981 Nasal congestion: Secondary | ICD-10-CM | POA: Diagnosis present

## 2020-06-14 DIAGNOSIS — Z7984 Long term (current) use of oral hypoglycemic drugs: Secondary | ICD-10-CM | POA: Insufficient documentation

## 2020-06-14 DIAGNOSIS — Z7952 Long term (current) use of systemic steroids: Secondary | ICD-10-CM | POA: Diagnosis not present

## 2020-06-14 DIAGNOSIS — E1140 Type 2 diabetes mellitus with diabetic neuropathy, unspecified: Secondary | ICD-10-CM | POA: Insufficient documentation

## 2020-06-14 DIAGNOSIS — Z79899 Other long term (current) drug therapy: Secondary | ICD-10-CM | POA: Diagnosis not present

## 2020-06-14 DIAGNOSIS — Z8249 Family history of ischemic heart disease and other diseases of the circulatory system: Secondary | ICD-10-CM | POA: Insufficient documentation

## 2020-06-14 DIAGNOSIS — Z881 Allergy status to other antibiotic agents status: Secondary | ICD-10-CM | POA: Diagnosis not present

## 2020-06-14 DIAGNOSIS — E7800 Pure hypercholesterolemia, unspecified: Secondary | ICD-10-CM | POA: Diagnosis not present

## 2020-06-14 DIAGNOSIS — E1136 Type 2 diabetes mellitus with diabetic cataract: Secondary | ICD-10-CM | POA: Insufficient documentation

## 2020-06-14 DIAGNOSIS — J0110 Acute frontal sinusitis, unspecified: Secondary | ICD-10-CM | POA: Diagnosis not present

## 2020-06-14 LAB — SARS CORONAVIRUS 2 (TAT 6-24 HRS): SARS Coronavirus 2: NEGATIVE

## 2020-06-14 MED ORDER — AMOXICILLIN-POT CLAVULANATE 875-125 MG PO TABS
1.0000 | ORAL_TABLET | Freq: Two times a day (BID) | ORAL | 0 refills | Status: AC
Start: 2020-06-14 — End: 2020-06-24

## 2020-06-14 MED ORDER — ALBUTEROL SULFATE HFA 108 (90 BASE) MCG/ACT IN AERS: 1.0000 | INHALATION_SPRAY | Freq: Four times a day (QID) | RESPIRATORY_TRACT | 0 refills | Status: DC | PRN

## 2020-06-14 MED ORDER — WIXELA INHUB 250-50 MCG/DOSE IN AEPB: 1.0000 | INHALATION_SPRAY | Freq: Every day | RESPIRATORY_TRACT | 0 refills | Status: DC

## 2020-06-14 NOTE — ED Triage Notes (Signed)
Pt presents to UC for chest/nasal congestion, productive cough with green/brown sputum, sinus pressure, headaches, sore throat. Pt denies fever, body aches, loss of taste/smell, n/v/d. Pt has been treating with mucinex with out relief.

## 2020-06-14 NOTE — Discharge Instructions (Signed)
Complete course of antibiotics.   If symptoms worsen or do not improve in the next week to return to be seen or to follow up with your PCP.

## 2020-06-15 NOTE — ED Provider Notes (Signed)
[place]    CSN: [id number] Arrival date & time: 06/14/20  1726      History   Chief Complaint Chief Complaint  Patient presents with  . Nasal Congestion    HPI Pamela Roberson is a 64 y.o. female.   Pamela Roberson presents with complaints of headache, sinus pressure and mucus production for the past two weeks. Was coughing but this has improved some. No fevers or chills. No gi symptoms. No increase in wheezing. She has been taking prednisone intermittently which she had at home from left over prescriptions. History  Of copd. No shortness of breath. Has been taking mucinex which hasn't helped. Says she is out of her advair discus s well. No history of covid-19 and has not received vaccination.    ROS per HPI, negative if not otherwise mentioned.      Past Medical History:  Diagnosis Date  . Arthritis   . Asthma   . Carotid stenosis, bilateral   . Chronic low back pain   . COPD (chronic obstructive pulmonary disease) ([place])   . Diabetes mellitus without complication ([place])    type II   . Diabetic neuropathy ([place])   . Diverticulitis   . Dyspnea    mild   . Frequent falls   . GERD (gastroesophageal reflux disease)   . History of bronchitis   . Hypercholesterolemia   . Hypertension   . IBS (irritable bowel syndrome)   . Osteoarthritis    knee  . Palpitations   . Sleep apnea    mild but no sleep apnea   . TMJ (dislocation of temporomandibular joint)   . 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 Vitamin D deficiency     Patient Active Problem List   Diagnosis Date Noted  . Dizzy 12/11/2017  . Pharyngeal dysphagia 12/11/2017  . Post-nasal drip 12/11/2017  . Referred ear pain, bilateral 12/11/2017  . Diabetes mellitus without complication ([place]) 89/21/1941  . Family history of glaucoma 06/29/2017  . Hyperopia of both eyes with astigmatism and presbyopia 06/29/2017  . Keratoconjunctivitis sicca of both eyes not specified as Sjogren's  06/29/2017  . Nuclear sclerotic cataract of both eyes 06/29/2017  . Vitreous floater, bilateral 06/29/2017  . Biceps tendinitis of right shoulder 04/05/2017  . Impingement syndrome of right shoulder 04/05/2017  . Arthritis of right acromioclavicular joint 04/05/2017  . Partial nontraumatic tear of rotator cuff, right 04/05/2017    Past Surgical History:  Procedure Laterality Date  . ABDOMINAL HYSTERECTOMY  1993  . CARPAL TUNNEL RELEASE Right   . CERVICAL DISC SURGERY  04/29/2015   Dr Tammi Klippel, [place], [place]  . ESOPHAGOGASTRODUODENOSCOPY ENDOSCOPY     with esophagus being stretched twice per pt,   . EYE SURGERY    . 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 feeding tube placement and removal   2015  . NASAL SEPTUM SURGERY    . spurs removed from esophagus   2015  . TONSILLECTOMY      OB History   No obstetric history on file.      Home Medications    Prior to Admission medications   Medication Sig Start Date End Date Taking? Authorizing Provider  acyclovir (ZOVIRAX) 400 MG tablet Take 400 mg by mouth 2 (two) times daily.    [provider]  albuterol (PROAIR HFA) 108 (90 Base) MCG/ACT inhaler Inhale 1-2 puffs into the lungs every 6 (six) hours as needed for wheezing  or shortness of breath. 06/14/20   Zigmund Gottron, NP  albuterol (PROVENTIL HFA;VENTOLIN HFA) 108 (90 BASE) MCG/ACT inhaler Inhale 2 puffs into the lungs every 4 (four) hours as needed for wheezing or shortness of breath.     [provider]  albuterol (PROVENTIL) (2.5 MG/3ML) 0.083% nebulizer solution Take 3 mLs (2.5 mg total) by nebulization every 4 (four) hours as needed for wheezing or shortness of breath. 03/11/19   Raylene Everts, MD  ALPRAZolam Duanne Moron) 0.5 MG tablet for sedation before MRI scan; take 1 tab 1 hour before scan; may repeat 1 tab 15 min before scan 12/09/19   Penumalli, Earlean Polka, MD  aluminum-magnesium hydroxide-simethicone (MAALOX) 010-932-35 MG/5ML SUSP Take 30 mLs by mouth 4  (four) times daily -  before meals and at bedtime. Patient taking differently: Take 30 mLs by mouth 3 (three) times daily as needed (acid reflux).  04/21/19   Zigmund Gottron, NP  amoxicillin-clavulanate (AUGMENTIN) 875-125 MG tablet Take 1 tablet by mouth every 12 (twelve) hours for 10 days. 06/14/20 06/24/20  Zigmund Gottron, NP  Artificial Tear Ointment (DRY EYES OP) Apply 1-2 drops to eye daily as needed (for dry eyes).    [provider]  azithromycin (ZITHROMAX) 250 MG tablet Take 1 tablet (250 mg total) by mouth daily. Take first 2 tablets together, then 1 every day until finished. 12/31/19   Vanessa Kick, MD  benzonatate (TESSALON) 100 MG capsule Take 1-2 capsules (100-200 mg total) by mouth 3 (three) times daily as needed. 05/14/20   Jaynee Eagles, PA-C  cephALEXin [facility]) 500 MG capsule  02/11/20   [provider]  DEXILANT 60 MG capsule 60 mg. 11/30/19   [provider]  dicyclomine (BENTYL) 20 MG tablet Take 20 mg by mouth 3 (three) times daily. 10/17/19   [provider]  EPINEPHrine 0.3 mg/0.3 mL IJ SOAJ injection Inject 0.3 mg into the muscle once as needed (allergic reactions).     [provider]  esomeprazole (NEXIUM) 40 MG capsule Take 1 capsule (40 mg total) by mouth daily. 04/24/19   Charlesetta Shanks, MD  fexofenadine (ALLEGRA) 180 MG tablet Take 1 tablet (180 mg total) by mouth daily. 09/07/19   Shelda Pal, DO  gabapentin (NEURONTIN) 300 MG capsule Take 300 mg by mouth 3 (three) times daily.    [provider]  guaiFENesin (MUCINEX) 600 MG 12 hr tablet Take 1 tablet (600 mg total) by mouth 2 (two) times daily. 04/24/19   Charlesetta Shanks, MD  ibuprofen (ADVIL) 600 MG tablet Take 1 tablet (600 mg total) by mouth every 8 (eight) hours as needed for headache. 07/13/19   Zigmund Gottron, NP  Lancets (ONETOUCH DELICA PLUS [id number]) [place]  08/15/19   [provider]  loratadine (CLARITIN) 10 MG tablet Take 10 mg by mouth  daily.    [provider]  metoprolol tartrate (LOPRESSOR) 25 MG tablet Take 25 mg by mouth 2 (two) times daily.    [provider]  montelukast (SINGULAIR) 10 MG tablet Take 10 mg by mouth every morning.     [provider]  Multiple Vitamin (MULTIVITAMIN WITH MINERALS) TABS tablet Take 1 tablet by mouth daily.    [provider]  nystatin cream (MYCOSTATIN) Apply to affected area 2 times daily 07/13/19   Augusto Gamble B, NP  ondansetron (ZOFRAN ODT) 4 MG disintegrating tablet Take 1 tablet (4 mg total) by mouth every 8 (eight) hours as needed for nausea or vomiting.  03/21/20   Loura Halt A, NP  pantoprazole (PROTONIX) 40 MG tablet  11/20/19   [provider]  predniSONE (STERAPRED UNI-PAK 21 TAB) 10 MG (21) TBPK tablet Take by mouth daily. Take as directed. 12/31/19   Vanessa Kick, MD  PROCTOZONE-HC 2.5 % rectal cream APPLY AA BID FOR 14 DAYS. 05/23/19   [provider]  promethazine-dextromethorphan (PROMETHAZINE-DM) 6.25-15 MG/5ML syrup Take 5 mLs by mouth at bedtime as needed for cough. 05/14/20   Jaynee Eagles, PA-C  sitaGLIPtin-metformin (JANUMET) 50-500 MG tablet Take 1 tablet by mouth daily.    [provider]  sucralfate (CARAFATE) 1 g tablet  02/11/20   [provider]  trimethoprim-polymyxin b (POLYTRIM) ophthalmic solution Place 1 drop into the right eye every 4 (four) hours. 09/07/19   Shelda Pal, DO  Vitamins/Minerals TABS Take by mouth.    [provider]  Grant Ruts INHUB 250-50 MCG/DOSE AEPB Inhale 1 puff into the lungs daily. 06/14/20   Zigmund Gottron, NP  atorvastatin (LIPITOR) 10 MG tablet Take 10 mg by mouth daily.  12/09/19  [provider]  fluticasone (FLONASE) 50 MCG/ACT nasal spray Place 2 sprays into both nostrils daily. Patient taking differently: Place 2 sprays into both nostrils daily as needed for allergies.  06/08/18 12/09/19  Raylene Everts, MD    Family History Family  History  Problem Relation Age of Onset  . Diabetes Father        type 2  . Heart disease Father   . Hypertension Father   . Heart disease Brother   . Hypertension Brother     Social History Social History   Tobacco Use  . Smoking status: Former Smoker    Packs/day: 0.50    Years: 30.00    Pack years: 15.00    Quit date: 12/08/1994    Years since quitting: 25.5  . Smokeless tobacco: Never Used  Vaping Use  . Vaping Use: Never used  Substance Use Topics  . Alcohol use: No  . Drug use: No     Allergies   Crab [shellfish allergy], Erythromycin, Fish allergy, Metoclopramide, Doxycycline, Levaquin [levofloxacin in d5w], Morphine, Morphine and related, and Sulfa antibiotics   Review of Systems Review of Systems   Physical Exam Triage Vital Signs ED Triage Vitals  Enc Vitals Group     BP 06/14/20 1751 135/71     Pulse Rate 06/14/20 1751 71     Resp 06/14/20 1751 16     Temp 06/14/20 1751 98.2 F (36.8 C)     Temp Source 06/14/20 1751 Oral     SpO2 06/14/20 1751 100 %     Weight --      Height --      Head Circumference --      Peak Flow --      Pain Score 06/14/20 1753 0     Pain Loc --      Pain Edu? --      Excl. in [place]? --    No data found.  Updated Vital Signs BP 135/71 (BP Location: Right Arm)   Pulse 71   Temp 98.2 F (36.8 C) (Oral)   Resp 16   SpO2 100%   Visual Acuity Right Eye Distance:   Left Eye Distance:   Bilateral Distance:    Right Eye Near:   Left Eye Near:    Bilateral Near:     Physical Exam Constitutional:      General: She is not in  acute distress.    Appearance: She is well-developed.  HENT:     Head: Normocephalic and atraumatic.     Right Ear: Tympanic membrane, ear canal and external ear normal.     Left Ear: Tympanic membrane, ear canal and external ear normal.     Nose:     Right Sinus: Maxillary sinus tenderness and frontal sinus tenderness present.     Left Sinus: Maxillary sinus tenderness and frontal sinus  tenderness present.     Mouth/Throat:     Pharynx: Uvula midline.     Tonsils: No tonsillar exudate.  Eyes:     Conjunctiva/sclera: Conjunctivae normal.     Pupils: Pupils are equal, round, and reactive to light.  Cardiovascular:     Rate and Rhythm: Normal rate and regular rhythm.     Heart sounds: Normal heart sounds.  Pulmonary:     Effort: Pulmonary effort is normal.     Breath sounds: Normal breath sounds.  Skin:    General: Skin is warm and dry.  Neurological:     Mental Status: She is alert and oriented to person, place, and time.      UC Treatments / Results  Labs (all labs ordered are listed, but only abnormal results are displayed) Labs Reviewed  SARS CORONAVIRUS 2 (TAT 6-24 HRS)    EKG   Radiology No results found.  Procedures Procedures (including critical care time)  Medications Ordered in UC Medications - No data to display  Initial Impression / Assessment and Plan / UC Course  I have reviewed the triage vital signs and the nursing notes.  Pertinent labs & imaging results that were available during my care of the patient were reviewed by me and considered in my medical decision making (see chart for details).    Two weeks of predominately sinus symptoms with sinus pressure and tenderness. Covering for sinusitis with antibiotics. If symptoms worsen or do not improve in the next week to return to be seen or to follow up with PCP.  Patient verbalized understanding and agreeable to plan.   Final Clinical Impressions(s) / UC Diagnoses   Final diagnoses:  Acute frontal sinusitis, recurrence not specified     Discharge Instructions     Complete course of antibiotics.   If symptoms worsen or do not improve in the next week to return to be seen or to follow up with your PCP.     ED Prescriptions    Medication Sig Dispense Auth. Provider   amoxicillin-clavulanate (AUGMENTIN) 875-125 MG tablet Take 1 tablet by mouth every 12 (twelve) hours for 10  days. 20 tablet Augusto Gamble B, NP   albuterol (PROAIR HFA) 108 (90 Base) MCG/ACT inhaler Inhale 1-2 puffs into the lungs every 6 (six) hours as needed for wheezing or shortness of breath. 8 g Zariah Jost B, NP   WIXELA INHUB 250-50 MCG/DOSE AEPB Inhale 1 puff into the lungs daily. 14 each Zigmund Gottron, NP     PDMP not reviewed this encounter.   Zigmund Gottron, NP 06/15/20 1140

## 2020-07-01 ENCOUNTER — Other Ambulatory Visit: Payer: Self-pay

## 2020-07-01 ENCOUNTER — Ambulatory Visit (INDEPENDENT_AMBULATORY_CARE_PROVIDER_SITE_OTHER): Payer: Medicare HMO | Admitting: Orthotics

## 2020-07-01 DIAGNOSIS — L84 Corns and callosities: Secondary | ICD-10-CM

## 2020-07-01 DIAGNOSIS — M2012 Hallux valgus (acquired), left foot: Secondary | ICD-10-CM

## 2020-07-01 DIAGNOSIS — M2011 Hallux valgus (acquired), right foot: Secondary | ICD-10-CM

## 2020-07-01 DIAGNOSIS — E1142 Type 2 diabetes mellitus with diabetic polyneuropathy: Secondary | ICD-10-CM | POA: Diagnosis not present

## 2020-07-01 NOTE — Progress Notes (Signed)

## 2020-07-08 ENCOUNTER — Encounter (HOSPITAL_COMMUNITY): Payer: Self-pay | Admitting: Emergency Medicine

## 2020-07-08 ENCOUNTER — Emergency Department (HOSPITAL_COMMUNITY): Payer: Medicare HMO

## 2020-07-08 ENCOUNTER — Other Ambulatory Visit: Payer: Self-pay

## 2020-07-08 ENCOUNTER — Emergency Department (HOSPITAL_COMMUNITY)
Admission: EM | Admit: 2020-07-08 | Discharge: 2020-07-08 | Disposition: A | Discharge status: Home / Self Care | Payer: Medicare HMO | Attending: Emergency Medicine | Admitting: Emergency Medicine

## 2020-07-08 DIAGNOSIS — R0789 Other chest pain: Secondary | ICD-10-CM | POA: Insufficient documentation

## 2020-07-08 DIAGNOSIS — Z87891 Personal history of nicotine dependence: Secondary | ICD-10-CM | POA: Diagnosis not present

## 2020-07-08 DIAGNOSIS — J449 Chronic obstructive pulmonary disease, unspecified: Secondary | ICD-10-CM | POA: Insufficient documentation

## 2020-07-08 DIAGNOSIS — M79652 Pain in left thigh: Secondary | ICD-10-CM | POA: Diagnosis not present

## 2020-07-08 DIAGNOSIS — W19XXXA Unspecified fall, initial encounter: Secondary | ICD-10-CM | POA: Insufficient documentation

## 2020-07-08 DIAGNOSIS — E1140 Type 2 diabetes mellitus with diabetic neuropathy, unspecified: Secondary | ICD-10-CM | POA: Insufficient documentation

## 2020-07-08 DIAGNOSIS — Z79899 Other long term (current) drug therapy: Secondary | ICD-10-CM | POA: Diagnosis not present

## 2020-07-08 DIAGNOSIS — I10 Essential (primary) hypertension: Secondary | ICD-10-CM | POA: Diagnosis not present

## 2020-07-08 DIAGNOSIS — K219 Gastro-esophageal reflux disease without esophagitis: Secondary | ICD-10-CM | POA: Diagnosis not present

## 2020-07-08 DIAGNOSIS — Z7951 Long term (current) use of inhaled steroids: Secondary | ICD-10-CM | POA: Insufficient documentation

## 2020-07-08 DIAGNOSIS — R079 Chest pain, unspecified: Secondary | ICD-10-CM | POA: Diagnosis present

## 2020-07-08 LAB — COMPREHENSIVE METABOLIC PANEL
ALT: 21 U/L (ref 0–44)
AST: 21 U/L (ref 15–41)
Albumin: 4.5 g/dL (ref 3.5–5.0)
Alkaline Phosphatase: 87 U/L (ref 38–126)
Anion gap: 10 (ref 5–15)
BUN: 10 mg/dL (ref 8–23)
CO2: 26 mmol/L (ref 22–32)
Calcium: 8.9 mg/dL (ref 8.9–10.3)
Chloride: 107 mmol/L (ref 98–111)
Creatinine, Ser: 0.8 mg/dL (ref 0.44–1.00)
GFR calc Af Amer: >60 mL/min (ref >60)
GFR calc non Af Amer: >60 mL/min (ref >60)
Glucose, Bld: 95 mg/dL (ref 70–99)
Potassium: 3.9 mmol/L (ref 3.5–5.1)
Sodium: 143 mmol/L (ref 135–145)
Total Bilirubin: 1.2 mg/dL (ref 0.3–1.2)
Total Protein: 6.9 g/dL (ref 6.5–8.1)

## 2020-07-08 LAB — CBC
HCT: 43.1 % (ref 36.0–46.0)
Hemoglobin: 14.1 g/dL (ref 12.0–15.0)
MCH: 29.6 pg (ref 26.0–34.0)
MCHC: 32.7 g/dL (ref 30.0–36.0)
MCV: 90.4 fL (ref 80.0–100.0)
Platelets: 259 10*3/uL (ref 150–400)
RBC: 4.77 MIL/uL (ref 3.87–5.11)
RDW: 13.4 % (ref 11.5–15.5)
WBC: 6.7 10*3/uL (ref 4.0–10.5)
nRBC: 0 % (ref 0.0–0.2)

## 2020-07-08 LAB — TROPONIN I (HIGH SENSITIVITY): Troponin I (High Sensitivity): <2 ng/L (ref <18)

## 2020-07-08 MED ORDER — LIDOCAINE 5 % EX PTCH
1.0000 | MEDICATED_PATCH | CUTANEOUS | Status: DC
  Administered 2020-07-08: 1 via TRANSDERMAL
  Filled 2020-07-08: qty 1

## 2020-07-08 MED ORDER — LIDOCAINE VISCOUS HCL 2 % MT SOLN
15.0000 mL | Freq: Once | OROMUCOSAL | Status: AC
  Administered 2020-07-08: 15 mL via ORAL
  Filled 2020-07-08: qty 15

## 2020-07-08 MED ORDER — ALUM & MAG HYDROXIDE-SIMETH 200-200-20 MG/5ML PO SUSP
30.0000 mL | Freq: Once | ORAL | Status: AC
  Administered 2020-07-08: 30 mL via ORAL
  Filled 2020-07-08: qty 30

## 2020-07-08 NOTE — ED Notes (Signed)
Pt verbalizes understanding of DC instructions. Pt belongings returned and is ambulatory out of ED.  

## 2020-07-08 NOTE — ED Provider Notes (Signed)
[facility] DEPT Provider Note   CSN: [id number] Arrival date & time: 07/08/20  1922     History Chief Complaint  Patient presents with  . Chest Pain  . Fall    Pamela Roberson is a 64 y.o. female.  Pt presents to the ED today with CP.  Pt said it feels like severe indigestion.  Pt does take Protonix and has been compliant with her meds.  Pt also fell over and hit the back of her head.  No loc.  No blood thinners.  Pt also has pain at the top of her left thigh.  It feels like a strain.  No sob.  No f/c.  No n/v.        Past Medical History:  Diagnosis Date  . Arthritis   . Asthma   . Carotid stenosis, bilateral   . Chronic low back pain   . COPD (chronic obstructive pulmonary disease) ([place])   . Diabetes mellitus without complication ([place])    type II   . Diabetic neuropathy ([place])   . Diverticulitis   . Dyspnea    mild   . Frequent falls   . GERD (gastroesophageal reflux disease)   . History of bronchitis   . Hypercholesterolemia   . Hypertension   . IBS (irritable bowel syndrome)   . Osteoarthritis    knee  . Palpitations   . Sleep apnea    mild but no sleep apnea   . TMJ (dislocation of temporomandibular joint)   . 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 Vitamin D deficiency     Patient Active Problem List   Diagnosis Date Noted  . Dizzy 12/11/2017  . Pharyngeal dysphagia 12/11/2017  . Post-nasal drip 12/11/2017  . Referred ear pain, bilateral 12/11/2017  . Diabetes mellitus without complication ([place]) 23/76/2831  . Family history of glaucoma 06/29/2017  . Hyperopia of both eyes with astigmatism and presbyopia 06/29/2017  . Keratoconjunctivitis sicca of both eyes not specified as Sjogren's 06/29/2017  . Nuclear sclerotic cataract of both eyes 06/29/2017  . Vitreous floater, bilateral 06/29/2017  . Biceps tendinitis of right shoulder 04/05/2017  . Impingement syndrome of right shoulder 04/05/2017  . Arthritis of  right acromioclavicular joint 04/05/2017  . Partial nontraumatic tear of rotator cuff, right 04/05/2017    Past Surgical History:  Procedure Laterality Date  . ABDOMINAL HYSTERECTOMY  1993  . CARPAL TUNNEL RELEASE Right   . CERVICAL DISC SURGERY  04/29/2015   Dr Tammi Klippel, [place], [place]  . ESOPHAGOGASTRODUODENOSCOPY ENDOSCOPY     with esophagus being stretched twice per pt,   . EYE SURGERY    . 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 feeding tube placement and removal   2015  . NASAL SEPTUM SURGERY    . spurs removed from esophagus   2015  . TONSILLECTOMY       OB History   No obstetric history on file.     Family History  Problem Relation Age of Onset  . Diabetes Father        type 2  . Heart disease Father   . Hypertension Father   . Heart disease Brother   . Hypertension Brother     Social History   Tobacco Use  . Smoking status: Former Smoker    Packs/day: 0.50    Years: 30.00    Pack years: 15.00    Quit date: 12/08/1994    Years  since quitting: 25.6  . Smokeless tobacco: Never Used  Vaping Use  . Vaping Use: Never used  Substance Use Topics  . Alcohol use: No  . Drug use: No    Home Medications Prior to Admission medications   Medication Sig Start Date End Date Taking? Authorizing Provider  acyclovir (ZOVIRAX) 400 MG tablet Take 400 mg by mouth 2 (two) times daily.   Yes [provider]  albuterol (PROAIR HFA) 108 (90 Base) MCG/ACT inhaler Inhale 1-2 puffs into the lungs every 6 (six) hours as needed for wheezing or shortness of breath. 06/14/20  Yes Burky, Lanelle Bal B, NP  albuterol (PROVENTIL) (2.5 MG/3ML) 0.083% nebulizer solution Take 3 mLs (2.5 mg total) by nebulization every 4 (four) hours as needed for wheezing or shortness of breath. 03/11/19  Yes Raylene Everts, MD  ALPRAZolam Duanne Moron) 0.5 MG tablet for sedation before MRI scan; take 1 tab 1 hour before scan; may repeat 1 tab 15 min before scan 12/09/19  Yes Penumalli, Earlean Polka, MD    Artificial Tear Ointment (DRY EYES OP) Apply 1-2 drops to eye daily as needed (for dry eyes).   Yes [provider]  atorvastatin (LIPITOR) 10 MG tablet Take 10 mg by mouth daily.   Yes [provider]  dicyclomine (BENTYL) 20 MG tablet Take 20 mg by mouth 3 (three) times daily. 10/17/19  Yes [provider]  EPINEPHrine 0.3 mg/0.3 mL IJ SOAJ injection Inject 0.3 mg into the muscle once as needed (allergic reactions).    Yes [provider]  fexofenadine (ALLEGRA) 180 MG tablet Take 1 tablet (180 mg total) by mouth daily. 09/07/19  Yes Shelda Pal, DO  gabapentin (NEURONTIN) 300 MG capsule Take 300 mg by mouth 3 (three) times daily.   Yes [provider]  guaiFENesin (MUCINEX) 600 MG 12 hr tablet Take 1 tablet (600 mg total) by mouth 2 (two) times daily. 04/24/19  Yes Charlesetta Shanks, MD  ibuprofen (ADVIL) 600 MG tablet Take 1 tablet (600 mg total) by mouth every 8 (eight) hours as needed for headache. 07/13/19  Yes Augusto Gamble B, NP  metoprolol tartrate (LOPRESSOR) 25 MG tablet Take 25 mg by mouth 2 (two) times daily.   Yes [provider]  montelukast (SINGULAIR) 10 MG tablet Take 10 mg by mouth every morning.    Yes [provider]  Multiple Vitamin (MULTIVITAMIN WITH MINERALS) TABS tablet Take 1 tablet by mouth daily.   Yes [provider]  nystatin cream (MYCOSTATIN) Apply to affected area 2 times daily Patient taking differently: Apply 1 application topically 2 (two) times daily.  07/13/19  Yes Burky, Lanelle Bal B, NP  ondansetron (ZOFRAN ODT) 4 MG disintegrating tablet Take 1 tablet (4 mg total) by mouth every 8 (eight) hours as needed for nausea or vomiting. 03/21/20  Yes Bast, Traci A, NP  pantoprazole (PROTONIX) 40 MG tablet Take 40 mg by mouth daily.  11/20/19  Yes [provider]  sitaGLIPtin-metformin (JANUMET) 50-500 MG tablet Take 1 tablet by mouth daily.   Yes [provider]  sucralfate  (CARAFATE) 1 g tablet Take 1 g by mouth 4 (four) times daily -  with meals and at bedtime.  02/11/20  Yes [provider]  Grant Ruts INHUB 250-50 MCG/DOSE AEPB Inhale 1 puff into the lungs daily. Patient taking differently: Inhale 1 puff into the lungs in the morning and at bedtime.  06/14/20  Yes Augusto Gamble B, NP  aluminum-magnesium hydroxide-simethicone (MAALOX) 200-200-20 MG/5ML SUSP Take  30 mLs by mouth 4 (four) times daily -  before meals and at bedtime. Patient not taking: Reported on 07/08/2020 04/21/19   Augusto Gamble B, NP  azithromycin (ZITHROMAX) 250 MG tablet Take 1 tablet (250 mg total) by mouth daily. Take first 2 tablets together, then 1 every day until finished. Patient not taking: Reported on 07/08/2020 12/31/19   Vanessa Kick, MD  benzonatate (TESSALON) 100 MG capsule Take 1-2 capsules (100-200 mg total) by mouth 3 (three) times daily as needed. Patient not taking: Reported on 07/08/2020 05/14/20   Jaynee Eagles, PA-C  esomeprazole (NEXIUM) 40 MG capsule Take 1 capsule (40 mg total) by mouth daily. Patient not taking: Reported on 07/08/2020 04/24/19   Charlesetta Shanks, MD  predniSONE (STERAPRED UNI-PAK 21 TAB) 10 MG (21) TBPK tablet Take by mouth daily. Take as directed. Patient not taking: Reported on 07/08/2020 12/31/19   Vanessa Kick, MD  promethazine-dextromethorphan (PROMETHAZINE-DM) 6.25-15 MG/5ML syrup Take 5 mLs by mouth at bedtime as needed for cough. Patient not taking: Reported on 07/08/2020 05/14/20   Jaynee Eagles, PA-C  trimethoprim-polymyxin b [facility] ophthalmic solution Place 1 drop into the right eye every 4 (four) hours. Patient not taking: Reported on 07/08/2020 09/07/19   Shelda Pal, DO  fluticasone [facility]) 50 MCG/ACT nasal spray Place 2 sprays into both nostrils daily. Patient taking differently: Place 2 sprays into both nostrils daily as needed for allergies.  06/08/18 12/09/19  Raylene Everts, MD    Allergies    Otho Darner allergy], Erythromycin,  Fish allergy, Metoclopramide, Doxycycline, Levaquin [levofloxacin in d5w], Morphine, Morphine and related, and Sulfa antibiotics  Review of Systems   Review of Systems  HENT:       Bump to head  Cardiovascular: Positive for chest pain.  Musculoskeletal:       Left upper thigh pain  All other systems reviewed and are negative.   Physical Exam Updated Vital Signs BP (!) 143/80   Pulse 94   Temp 98.6 F (37 C) (Oral)   Resp 17   Ht 5\' 7"  (1.702 m)   Wt 90.7 kg   SpO2 100%   BMI 31.32 kg/m   Physical Exam Vitals and nursing note reviewed.  Constitutional:      Appearance: She is well-developed.  HENT:     Head: Normocephalic.      Comments: Small contusion/hematoma back of head Eyes:     Extraocular Movements: Extraocular movements intact.     Pupils: Pupils are equal, round, and reactive to light.  Cardiovascular:     Rate and Rhythm: Normal rate and regular rhythm.     Heart sounds: Normal heart sounds.  Pulmonary:     Effort: Pulmonary effort is normal.     Breath sounds: Normal breath sounds.  Abdominal:     General: Bowel sounds are normal.     Palpations: Abdomen is soft.  Musculoskeletal:        General: Normal range of motion.     Cervical back: Normal range of motion and neck supple.       Legs:  Skin:    General: Skin is warm.     Capillary Refill: Capillary refill takes less than 2 seconds.  Neurological:     General: No focal deficit present.     Mental Status: She is alert and oriented to person, place, and time.  Psychiatric:        Mood and Affect: Mood normal.        Behavior: Behavior  normal.     ED Results / Procedures / Treatments   Labs (all labs ordered are listed, but only abnormal results are displayed) Labs Reviewed  CBC  COMPREHENSIVE METABOLIC PANEL  TROPONIN I (HIGH SENSITIVITY)    EKG EKG Interpretation  Date/Time:  Wednesday July 08 2020 20:13:33 EDT Ventricular Rate:  95 PR Interval:    QRS Duration: 89 QT  Interval:  337 QTC Calculation: 424 R Axis:   21 Text Interpretation: Sinus rhythm Low voltage, precordial leads Minimal ST elevation, inferior leads No significant change since last tracing Confirmed by Isla Pence [phone]) on 07/08/2020 8:32:34 PM   Radiology DG Chest 2 View  Result Date: 07/08/2020 CLINICAL DATA:  Chest pain. EXAM: CHEST - 2 VIEW COMPARISON:  May 22, 2020 FINDINGS: There is no evidence of acute infiltrate, pleural effusion or pneumothorax. The heart size and mediastinal contours are within normal limits. Radiopaque operative material is again seen overlying the lower cervical spine. The visualized skeletal structures are unremarkable. IMPRESSION: No active cardiopulmonary disease. Electronically Signed   By: Virgina Norfolk M.D.   On: 07/08/2020 20:11    Procedures Procedures (including critical care time)  Medications Ordered in ED Medications  lidocaine (LIDODERM) 5 % 1 patch (1 patch Transdermal Patch Applied 07/08/20 2009)  lidocaine (LIDODERM) 5 % 1 patch (has no administration in time range)  alum & mag hydroxide-simeth (MAALOX/MYLANTA) 200-200-20 MG/5ML suspension 30 mL (30 mLs Oral Given 07/08/20 2008)    And  lidocaine (XYLOCAINE) 2 % viscous mouth solution 15 mL (15 mLs Oral Given 07/08/20 2009)    ED Course  I have reviewed the triage vital signs and the nursing notes.  Pertinent labs & imaging results that were available during my care of the patient were reviewed by me and considered in my medical decision making (see chart for details).    MDM Rules/Calculators/A&P                          Pt is feeling much better after the GI cocktail.  Cardiac work up is negative.    Pt is stable for d/c.  Return if worse. Final Clinical Impression(s) / ED Diagnoses Final diagnoses:  Gastroesophageal reflux disease, unspecified whether esophagitis present  Atypical chest pain    Rx / DC Orders ED Discharge Orders    None       Isla Pence,  MD 07/08/20 2057

## 2020-08-06 ENCOUNTER — Encounter (HOSPITAL_COMMUNITY): Payer: Self-pay

## 2020-08-06 ENCOUNTER — Ambulatory Visit (HOSPITAL_COMMUNITY)
Admission: EM | Admit: 2020-08-06 | Discharge: 2020-08-06 | Disposition: A | Discharge status: Home / Self Care | Payer: Medicare HMO | Attending: Family Medicine | Admitting: Family Medicine

## 2020-08-06 ENCOUNTER — Other Ambulatory Visit: Payer: Self-pay

## 2020-08-06 DIAGNOSIS — R059 Cough, unspecified: Secondary | ICD-10-CM

## 2020-08-06 DIAGNOSIS — R05 Cough: Secondary | ICD-10-CM

## 2020-08-06 DIAGNOSIS — R062 Wheezing: Secondary | ICD-10-CM

## 2020-08-06 DIAGNOSIS — R1013 Epigastric pain: Secondary | ICD-10-CM

## 2020-08-06 MED ORDER — WIXELA INHUB 250-50 MCG/DOSE IN AEPB: 1.0000 | INHALATION_SPRAY | Freq: Every day | RESPIRATORY_TRACT | 0 refills | Status: DC

## 2020-08-06 MED ORDER — FLUCONAZOLE 150 MG PO TABS
ORAL_TABLET | ORAL | 0 refills | Status: DC
Start: 2020-08-06 — End: 2020-10-13

## 2020-08-06 MED ORDER — AZITHROMYCIN 250 MG PO TABS
250.0000 mg | ORAL_TABLET | Freq: Every day | ORAL | 0 refills | Status: DC
Start: 2020-08-06 — End: 2020-10-13

## 2020-08-06 NOTE — ED Triage Notes (Signed)
Pt presents today with cough, some wheezing and acid reflux x2 weeks. States she is constantly burping and has burning feeling in her chest. Has also had what feels like drainage and the feeling of throat closing up when trying to swallow. Does have a hx of COPD.

## 2020-08-11 ENCOUNTER — Other Ambulatory Visit: Payer: Self-pay | Admitting: Gastroenterology

## 2020-08-11 NOTE — ED Provider Notes (Signed)
[place]   [id number] 08/06/20 Arrival Time: 1930  ASSESSMENT & PLAN:  1. Cough   2. Wheezing   3. Dyspepsia     No indication for chest imaging at this time. Has acid reflux med to continue at home.  Meds ordered this encounter  Medications  . WIXELA INHUB 250-50 MCG/DOSE AEPB    Sig: Inhale 1 puff into the lungs daily.    Dispense:  14 each    Refill:  0  . azithromycin (ZITHROMAX) 250 MG tablet    Sig: Take 1 tablet (250 mg total) by mouth daily. Take first 2 tablets together, then 1 every day until finished.    Dispense:  6 tablet    Refill:  0  . fluconazole (DIFLUCAN) 150 MG tablet    Sig: Take one tablet by mouth as a single dose. May repeat in 3 days if symptoms persist.    Dispense:  2 tablet    Refill:  [place], [place].   Why: If worsening or failing to improve as anticipated. Contact information: [address] [place] [id number] [phone]               Reviewed expectations re: course of current medical issues. Questions answered. Outlined signs and symptoms indicating need for more acute intervention. Understanding verbalized. After Visit Summary given.   SUBJECTIVE: History from: patient. Pamela Roberson is a 64 y.o. female who presents today with cough, some wheezing and acid reflux x2 weeks.Burping and has burning feeling in her chest. Has also had what feels like drainage and the feeling of throat closing up when trying to swallow. Does have a hx of COPD. Known COVID-19 contact: none. Recent travel: none. Denies: fever. Normal PO intake without n/v/d.    OBJECTIVE:  Vitals:   08/06/20 2026  BP: (!) 124/56  Pulse: 84  Resp: 16  Temp: 99.4 F (37.4 C)  TempSrc: Oral  SpO2: 100%    General appearance: alert; no distress Eyes: PERRLA; EOMI; conjunctiva normal HENT: [place]; AT; nasal mucosa normal; oral mucosa normal Neck: supple  Lungs: speaks full sentences without  difficulty; unlabored; dry cough Extremities: no edema Skin: warm and dry Neurologic: normal gait Psychological: alert and cooperative; normal mood and affect    Allergies  Allergen Reactions  . Crab [Shellfish Allergy] Shortness Of Breath and Swelling  . Erythromycin Swelling and Rash  . Hm Lidocaine Patch [Lidocaine]     Burning on skin  . Fish Allergy     Swelling   . Metoclopramide Other (See Comments)    Slurred speech and unable to move muscles,  Caused her to drag her feet  . Doxycycline     GI Intolerance  . Levaquin [Levofloxacin In D5w] Other (See Comments)    Causes irregular heart beat  . Morphine Nausea And Vomiting and Rash    Irregular heart beat  . Morphine And Related Nausea And Vomiting and Rash    Irregular heart beat  . Sulfa Antibiotics Rash    Past Medical History:  Diagnosis Date  . Arthritis   . Asthma   . Carotid stenosis, bilateral   . Chronic low back pain   . COPD (chronic obstructive pulmonary disease) ([place])   . Diabetes mellitus without complication ([place])    type II   . Diabetic neuropathy ([place])   . Diverticulitis   . Dyspnea    mild   . Frequent falls   .  GERD (gastroesophageal reflux disease)   . History of bronchitis   . Hypercholesterolemia   . Hypertension   . IBS (irritable bowel syndrome)   . Osteoarthritis    knee  . Palpitations   . Sleep apnea    mild but no sleep apnea   . TMJ (dislocation of temporomandibular joint)   . 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 Vitamin D deficiency    Social History   Socioeconomic History  . Marital status: Widowed    Spouse name: Not on file  . Number of children: 3  . Years of education: Not on file  . Highest education level: 11th grade  Occupational History    Comment: NA  Tobacco Use  . Smoking status: Former Smoker    Packs/day: 0.50    Years: 30.00    Pack years: 15.00    Quit date: 12/08/1994    Years since quitting: 25.6  . Smokeless tobacco: Never Used   Vaping Use  . Vaping Use: Never used  Substance and Sexual Activity  . Alcohol use: No  . Drug use: No  . Sexual activity: Not on file  Other Topics Concern  . Not on file  Social History Narrative   Lives with daughter   Caffeine- coffee 12 oz   Social Determinants of Health   Financial Resource Strain:   . Difficulty of Paying Living Expenses: Not on file  Food Insecurity:   . Worried About Charity fundraiser in the Last Year: Not on file  . Ran Out of Food in the Last Year: Not on file  Transportation Needs:   . Lack of Transportation (Medical): Not on file  . Lack of Transportation (Non-Medical): Not on file  Physical Activity:   . Days of Exercise per Week: Not on file  . Minutes of Exercise per Session: Not on file  Stress:   . Feeling of Stress : Not on file  Social Connections:   . Frequency of Communication with Friends and Family: Not on file  . Frequency of Social Gatherings with Friends and Family: Not on file  . Attends Religious Services: Not on file  . Active Member of Clubs or Organizations: Not on file  . Attends Archivist Meetings: Not on file  . Marital Status: Not on file  Intimate Partner Violence:   . Fear of Current or Ex-Partner: Not on file  . Emotionally Abused: Not on file  . Physically Abused: Not on file  . Sexually Abused: Not on file   Family History  Problem Relation Age of Onset  . Diabetes Father        type 2  . Heart disease Father   . Hypertension Father   . Heart disease Brother   . Hypertension Brother    Past Surgical History:  Procedure Laterality Date  . ABDOMINAL HYSTERECTOMY  1993  . CARPAL TUNNEL RELEASE Right   . CERVICAL DISC SURGERY  04/29/2015   Dr Tammi Klippel, [place], [place]  . ESOPHAGOGASTRODUODENOSCOPY ENDOSCOPY     with esophagus being stretched twice per pt,   . EYE SURGERY    . 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 feeding tube placement and removal   2015  . NASAL SEPTUM SURGERY    . spurs  removed from esophagus   2015  . Evonnie Dawes, MD 08/11/20 [phone]

## 2020-08-19 IMAGING — RF DG ESOPHAGUS
11 series · 14 of 24 positions shown · non-contrast
Comparison: None.

CLINICAL DATA: Esophageal dysphagia.

EXAM:
ESOPHOGRAM / BARIUM SWALLOW / BARIUM TABLET STUDY
TECHNIQUE: Combined double contrast and single contrast examination performed
using effervescent crystals, thick barium liquid, and thin barium
liquid. The patient was observed with fluoroscopy swallowing a 13 mm
barium sulphate tablet.
FLUOROSCOPY TIME:  Fluoroscopy Time:  2 minutes and 24 seconds
Radiation Exposure Index (if provided by the fluoroscopic device):
376 mGy
Number of Acquired Spot Images: 0

[Series 1: sequence · 0.31mm/px · 2 of 19 frames shown (1 of 9)]
[frame 3/19]
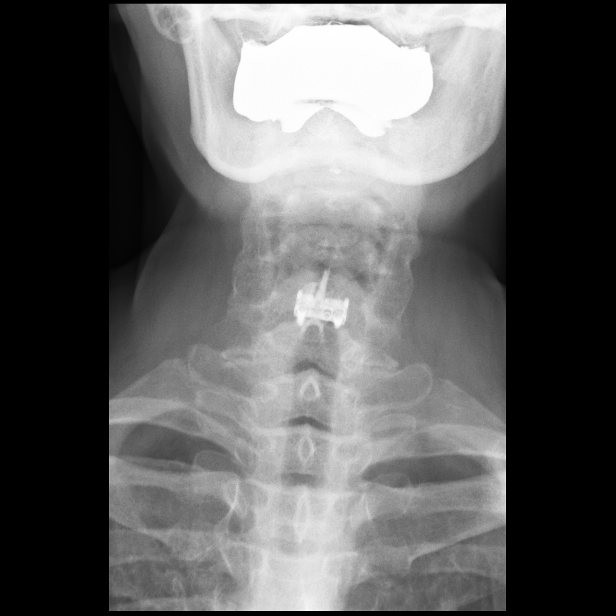
[frame 17/19]
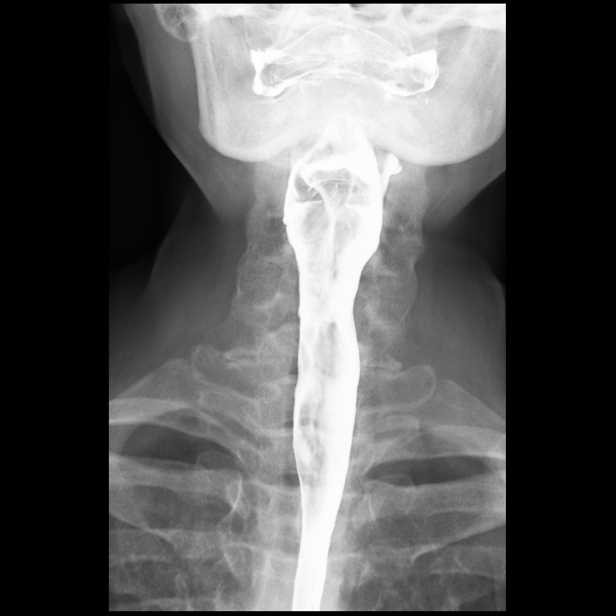

[Series 2: sequence · 0.31mm/px · 1 of 19 frames shown (2 of 9)]
[frame 17/19]
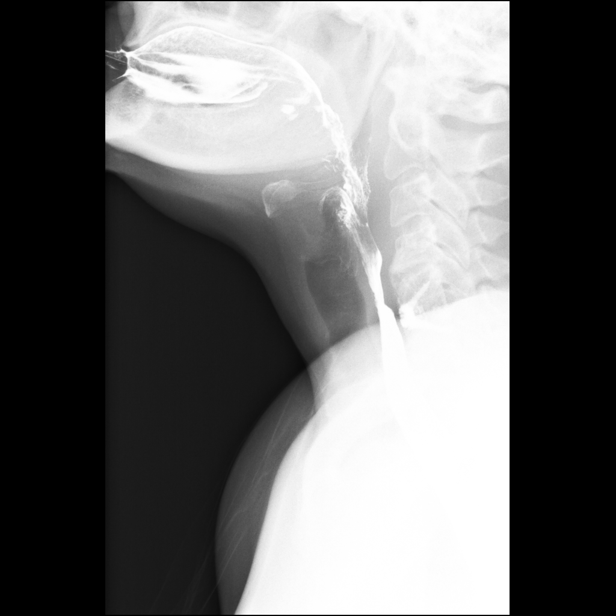

[Series 3: sequence · 0.28mm/px · 2 of 11 frames shown (3 of 9)]
[frame 9/11]
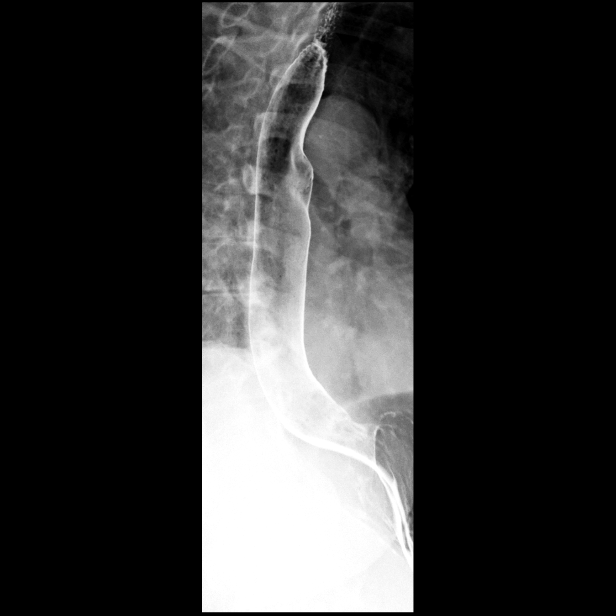
[frame 10/11]
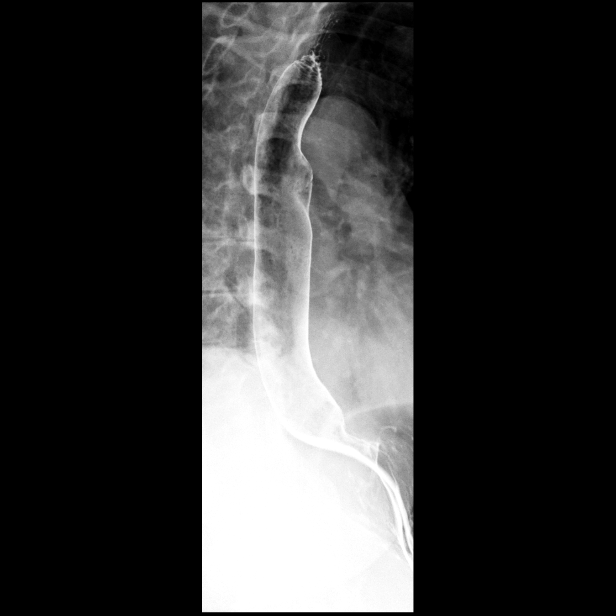

[Series 4: sequence · 0.28mm/px · 1 of 8 frames shown (4 of 9)]
[frame 6/8]
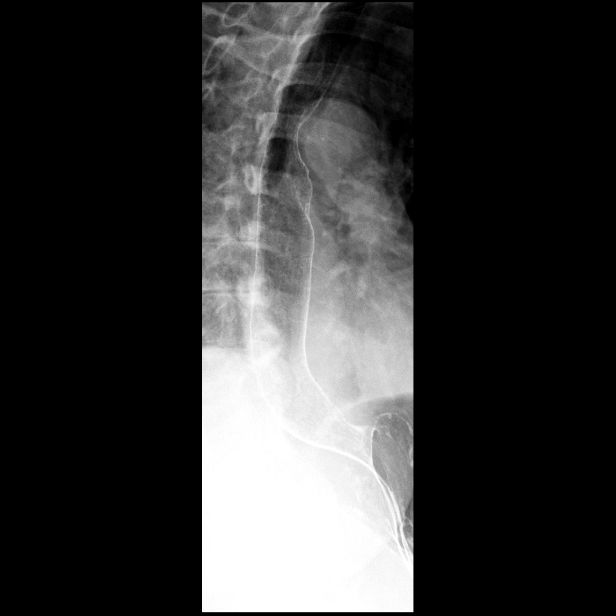

[Series 5: one shot · 1 of 3 slices shown (1 of 2)]
[im 3/3]
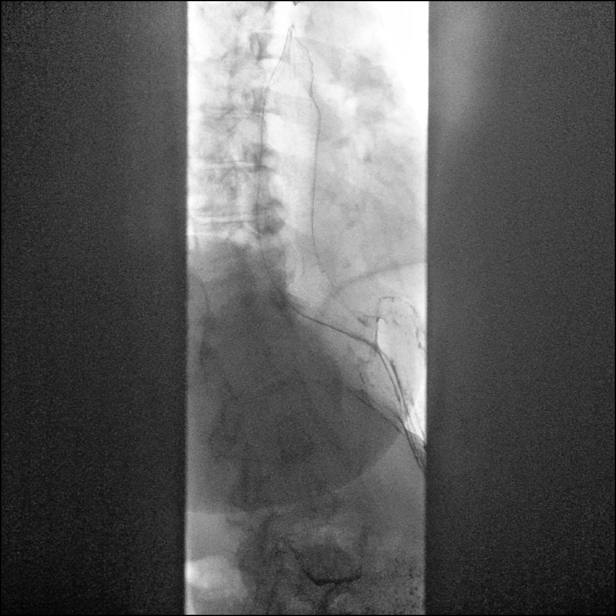

[Series 6: sequence · 1 of 17 frames shown (5 of 9)]
[frame 3/17]
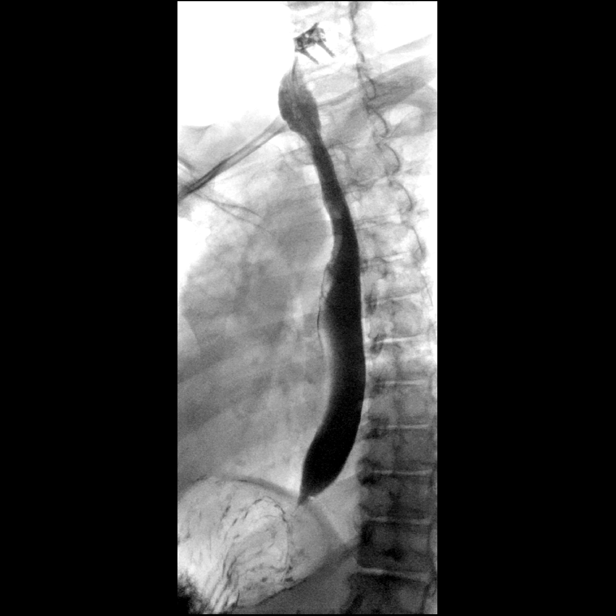

[Series 7: sequence · 1 of 20 frames shown (6 of 9)]
[frame 9/20]
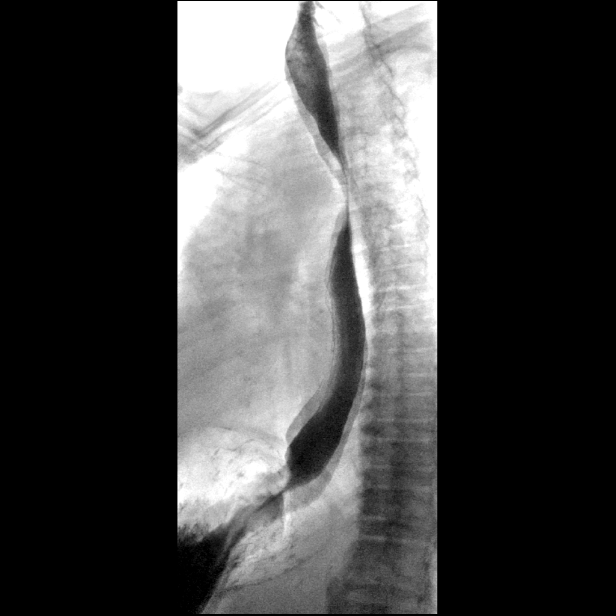

[Series 8: sequence · 1 of 10 frames shown (7 of 9)]
[frame 2/10]
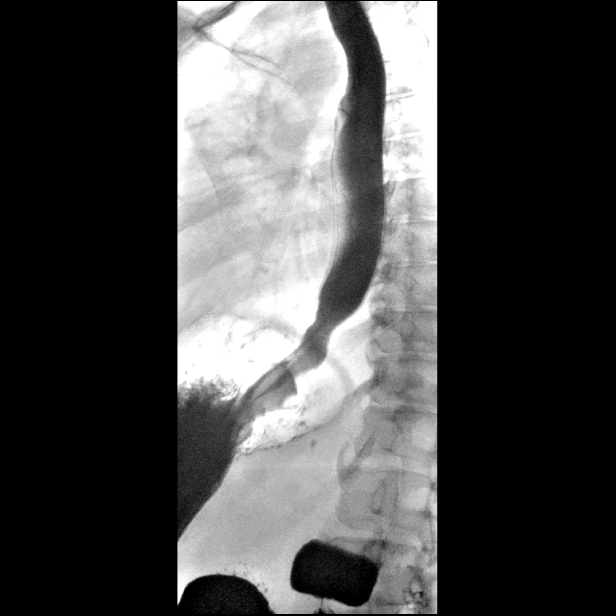

[Series 9: sequence · 2 of 26 frames shown (8 of 9)]
[frame 4/26]
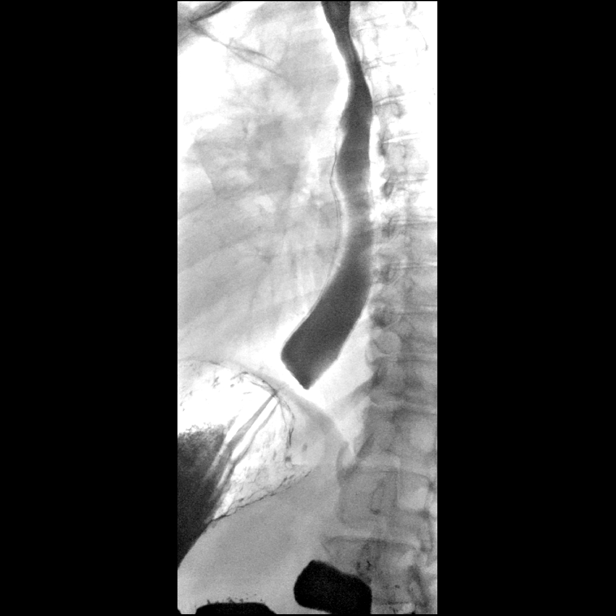
[frame 18/26]
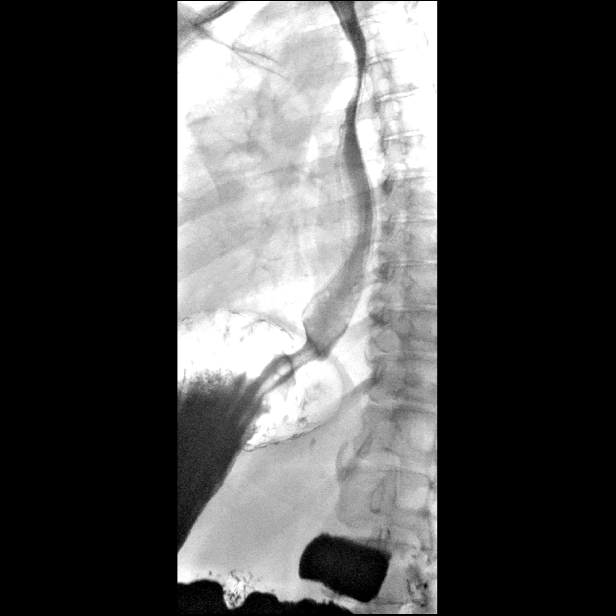

[Series 10: one shot · 1 of 2 slices shown (2 of 2)]
[im 2/2]
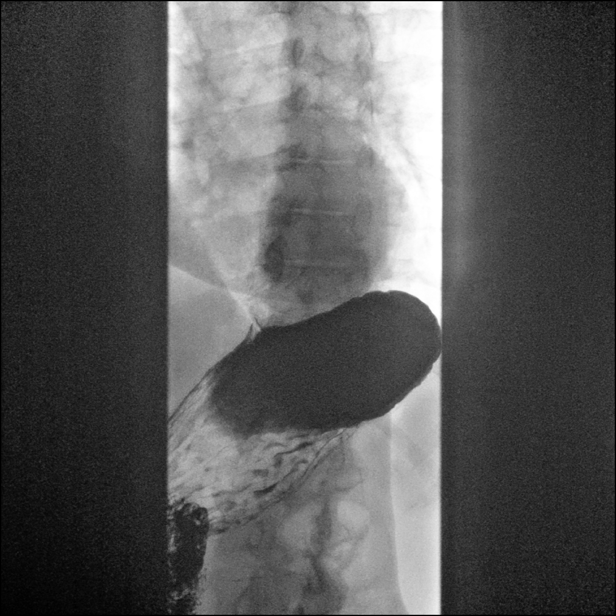

[Series 11: sequence · 1 of 9 frames shown (9 of 9)]
[frame 8/9]
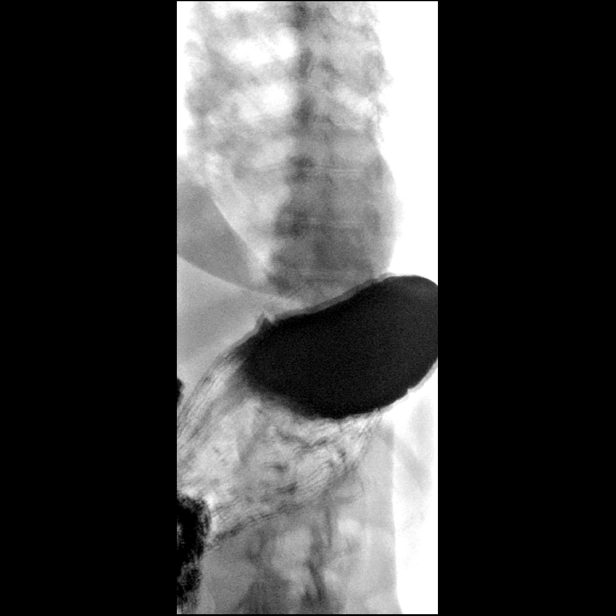

[14 of 24 positions shown; findings below may reference images not displayed]

FINDINGS: Initial barium swallows demonstrate normal pharyngeal motion with
swallowing. No laryngeal penetration or aspiration. No upper
esophageal webs, strictures or diverticuli.

Normal esophageal motility. No intrinsic or extrinsic lesions of the
esophagus are identified.

Very small sliding-type hiatal hernia and minimal inducible GE
reflux with water swallowing.
IMPRESSION: 1. Normal esophageal motility.  No esophageal mass or stricture.
2. Very small hiatal hernia and minimal inducible GE reflux.

## 2020-08-25 ENCOUNTER — Encounter: Payer: Self-pay | Admitting: Podiatry

## 2020-08-25 ENCOUNTER — Ambulatory Visit (INDEPENDENT_AMBULATORY_CARE_PROVIDER_SITE_OTHER): Payer: Medicare HMO | Admitting: Podiatry

## 2020-08-25 ENCOUNTER — Other Ambulatory Visit: Payer: Self-pay

## 2020-08-25 DIAGNOSIS — L84 Corns and callosities: Secondary | ICD-10-CM

## 2020-08-25 DIAGNOSIS — E1142 Type 2 diabetes mellitus with diabetic polyneuropathy: Secondary | ICD-10-CM | POA: Diagnosis not present

## 2020-08-25 DIAGNOSIS — M79675 Pain in left toe(s): Secondary | ICD-10-CM

## 2020-08-25 DIAGNOSIS — M79674 Pain in right toe(s): Secondary | ICD-10-CM

## 2020-08-25 DIAGNOSIS — B351 Tinea unguium: Secondary | ICD-10-CM

## 2020-08-25 NOTE — Progress Notes (Signed)
Subjective: Pamela Roberson presents today for follow up of preventative diabetic foot care and callus(es) b/l feet and painful mycotic toenails b/l that are difficult to trim. Pain interferes with ambulation. Aggravating factors include wearing enclosed shoe gear. Pain is relieved with periodic professional debridement.   She has symptoms of diabetic neuropathy which is controlled with gabapentin.  She did not check her blood sugar today, but states her last A1c was 6.0%.  She states she missed her last appointment and her calluses are really painful on today's visit. Denies any redness, drainage or swelling.  Allergies  Allergen Reactions  . Crab [Shellfish Allergy] Shortness Of Breath and Swelling  . Erythromycin Swelling and Rash  . Hm Lidocaine Patch [Lidocaine]     Burning on skin  . Fish Allergy     Swelling   . Metoclopramide Other (See Comments)    Slurred speech and unable to move muscles,  Caused her to drag her feet  . Doxycycline     GI Intolerance  . Levaquin [Levofloxacin In D5w] Other (See Comments)    Causes irregular heart beat  . Morphine Nausea And Vomiting and Rash    Irregular heart beat  . Morphine And Related Nausea And Vomiting and Rash    Irregular heart beat  . Sulfa Antibiotics Rash     Objective: There were no vitals filed for this visit.  Pt is a pleasant 64 y.o. year old African American female  in NAD. AAO x 3.   Vascular Examination:  Capillary refill time to digits immediate b/l. Palpable DP pulses b/l. Faintly palpable PT pulses b/l. Pedal hair absent b/l Skin temperature gradient within normal limits b/l. No pain with calf compression b/l.  Dermatological Examination: Pedal skin with normal turgor, texture and tone bilaterally. No open wounds bilaterally. No interdigital macerations bilaterally. Toenails 1-5 b/l elongated, dystrophic, thickened, crumbly with subungual debris and tenderness to dorsal palpation. Hyperkeratotic lesion(s)  submet head 5 b/l.  No erythema, no edema, no drainage, no flocculence.  Musculoskeletal: Normal muscle strength 5/5 to all lower extremity muscle groups bilaterally, no pain crepitus or joint limitation noted with ROM b/l and bunion deformity noted b/l  Neurological: Protective sensation intact 5/5 intact bilaterally with 10g monofilament b/l Vibratory sensation intact b/l  Assessment: 1. Pain due to onychomycosis of toenails of both feet   2. Callus   3. Diabetic peripheral neuropathy associated with type 2 diabetes mellitus ([place])    Plan: -Examined patient. -No new findings. No new orders. -Continue diabetic foot care principles. -Toenails 1-5 b/l were debrided in length and girth with sterile nail nippers and dremel without iatrogenic bleeding.  -Callus(es) submet head 5 left foot and submet head 5 right foot pared utilizing sterile scalpel blade without complication or incident. Total number debrided =2. -Patient to report any pedal injuries to medical professional immediately. -Patient to continue soft, supportive shoe gear daily. Start procedure for diabetic shoes. Patient qualifies based on diagnoses. -Patient/POA to call should there be question/concern in the interim.  Return in about 3 months (around 11/24/2020).

## 2020-08-28 ENCOUNTER — Encounter (HOSPITAL_COMMUNITY): Payer: Self-pay

## 2020-08-28 ENCOUNTER — Ambulatory Visit (HOSPITAL_COMMUNITY)
Admission: EM | Admit: 2020-08-28 | Discharge: 2020-08-28 | Disposition: A | Discharge status: Home / Self Care | Payer: Medicare HMO | Attending: Urgent Care | Admitting: Urgent Care

## 2020-08-28 ENCOUNTER — Other Ambulatory Visit: Payer: Self-pay

## 2020-08-28 DIAGNOSIS — Z87891 Personal history of nicotine dependence: Secondary | ICD-10-CM | POA: Insufficient documentation

## 2020-08-28 DIAGNOSIS — Z791 Long term (current) use of non-steroidal anti-inflammatories (NSAID): Secondary | ICD-10-CM | POA: Diagnosis not present

## 2020-08-28 DIAGNOSIS — M5442 Lumbago with sciatica, left side: Secondary | ICD-10-CM | POA: Diagnosis not present

## 2020-08-28 DIAGNOSIS — E7800 Pure hypercholesterolemia, unspecified: Secondary | ICD-10-CM | POA: Diagnosis not present

## 2020-08-28 DIAGNOSIS — R5381 Other malaise: Secondary | ICD-10-CM | POA: Diagnosis not present

## 2020-08-28 DIAGNOSIS — M1990 Unspecified osteoarthritis, unspecified site: Secondary | ICD-10-CM | POA: Insufficient documentation

## 2020-08-28 DIAGNOSIS — J449 Chronic obstructive pulmonary disease, unspecified: Secondary | ICD-10-CM | POA: Insufficient documentation

## 2020-08-28 DIAGNOSIS — E1140 Type 2 diabetes mellitus with diabetic neuropathy, unspecified: Secondary | ICD-10-CM | POA: Insufficient documentation

## 2020-08-28 DIAGNOSIS — K219 Gastro-esophageal reflux disease without esophagitis: Secondary | ICD-10-CM | POA: Insufficient documentation

## 2020-08-28 DIAGNOSIS — K589 Irritable bowel syndrome without diarrhea: Secondary | ICD-10-CM | POA: Insufficient documentation

## 2020-08-28 DIAGNOSIS — R5383 Other fatigue: Secondary | ICD-10-CM

## 2020-08-28 DIAGNOSIS — Z20822 Contact with and (suspected) exposure to covid-19: Secondary | ICD-10-CM | POA: Insufficient documentation

## 2020-08-28 DIAGNOSIS — Z7984 Long term (current) use of oral hypoglycemic drugs: Secondary | ICD-10-CM | POA: Insufficient documentation

## 2020-08-28 DIAGNOSIS — I10 Essential (primary) hypertension: Secondary | ICD-10-CM | POA: Insufficient documentation

## 2020-08-28 DIAGNOSIS — Z79899 Other long term (current) drug therapy: Secondary | ICD-10-CM | POA: Diagnosis not present

## 2020-08-28 DIAGNOSIS — R110 Nausea: Secondary | ICD-10-CM

## 2020-08-28 MED ORDER — TIZANIDINE HCL 4 MG PO TABS: 4.0000 mg | ORAL_TABLET | Freq: Three times a day (TID) | ORAL | 0 refills | Status: DC | PRN

## 2020-08-28 MED ORDER — PREDNISONE 20 MG PO TABS
ORAL_TABLET | ORAL | 0 refills | Status: DC
Start: 2020-08-28 — End: 2020-10-13

## 2020-08-28 MED ORDER — ONDANSETRON 8 MG PO TBDP
8.0000 mg | ORAL_TABLET | Freq: Three times a day (TID) | ORAL | 0 refills | Status: DC | PRN
Start: 2020-08-28 — End: 2022-04-11

## 2020-08-28 NOTE — ED Triage Notes (Signed)
Pt presents with fatigue, sore throat, nausea, and left hip pain that radiates down into left leg.

## 2020-08-28 NOTE — ED Provider Notes (Signed)
[place]   MRN: [id number] DOB: Jan 22, 1956  Subjective:   Pamela Roberson is a 64 y.o. female presenting for several day hx of acute onset left sided low back pain that radiates into her left leg with numbness and tingling of her left lower leg. Has a hx of sciatica, responds well to steroids. Has had acute on chronic nausea without vomiting, wants Zofran. Has had some fatigue and throat pain.   No current facility-administered medications for this encounter.  Current Outpatient Medications:  .  acyclovir (ZOVIRAX) 400 MG tablet, Take 400 mg by mouth 2 (two) times daily., Disp: , Rfl:  .  albuterol (PROAIR HFA) 108 (90 Base) MCG/ACT inhaler, Inhale 1-2 puffs into the lungs every 6 (six) hours as needed for wheezing or shortness of breath., Disp: 8 g, Rfl: 0 .  albuterol (PROVENTIL) (2.5 MG/3ML) 0.083% nebulizer solution, Take 3 mLs (2.5 mg total) by nebulization every 4 (four) hours as needed for wheezing or shortness of breath., Disp: 30 vial, Rfl: 0 .  aluminum-magnesium hydroxide-simethicone (MAALOX) 409-735-32 MG/5ML SUSP, Take 30 mLs by mouth 4 (four) times daily -  before meals and at bedtime. (Patient not taking: Reported on 07/08/2020), Disp: 710 mL, Rfl: 0 .  Artificial Tear Ointment (DRY EYES OP), Apply 1-2 drops to eye daily as needed (for dry eyes)., Disp: , Rfl:  .  atorvastatin (LIPITOR) 10 MG tablet, Take 10 mg by mouth daily., Disp: , Rfl:  .  azithromycin (ZITHROMAX) 250 MG tablet, Take 1 tablet (250 mg total) by mouth daily. Take first 2 tablets together, then 1 every day until finished., Disp: 6 tablet, Rfl: 0 .  dicyclomine (BENTYL) 20 MG tablet, Take 20 mg by mouth 3 (three) times daily., Disp: , Rfl:  .  dilTIAZem HCl POWD, , Disp: , Rfl:  .  EPINEPHrine 0.3 mg/0.3 mL IJ SOAJ injection, Inject 0.3 mg into the muscle once as needed (allergic reactions). , Disp: , Rfl:  .  fexofenadine (ALLEGRA) 180 MG tablet, Take 1 tablet (180 mg total) by mouth  daily., Disp: 30 tablet, Rfl: 1 .  fluconazole (DIFLUCAN) 150 MG tablet, Take one tablet by mouth as a single dose. May repeat in 3 days if symptoms persist., Disp: 2 tablet, Rfl: 0 .  gabapentin (NEURONTIN) 300 MG capsule, Take 300 mg by mouth 3 (three) times daily., Disp: , Rfl:  .  ibuprofen (ADVIL) 600 MG tablet, Take 1 tablet (600 mg total) by mouth every 8 (eight) hours as needed for headache., Disp: 30 tablet, Rfl: 0 .  lidocaine (LIDODERM) 5 %, SMARTSIG:Topical, Disp: , Rfl:  .  metoprolol tartrate (LOPRESSOR) 25 MG tablet, Take 25 mg by mouth 2 (two) times daily., Disp: , Rfl:  .  montelukast (SINGULAIR) 10 MG tablet, Take 10 mg by mouth every morning. , Disp: , Rfl:  .  Multiple Vitamin (MULTIVITAMIN WITH MINERALS) TABS tablet, Take 1 tablet by mouth daily., Disp: , Rfl:  .  nystatin cream (MYCOSTATIN), Apply to affected area 2 times daily (Patient taking differently: Apply 1 application topically 2 (two) times daily. ), Disp: 30 g, Rfl: 0 .  ondansetron (ZOFRAN ODT) 4 MG disintegrating tablet, Take 1 tablet (4 mg total) by mouth every 8 (eight) hours as needed for nausea or vomiting., Disp: 20 tablet, Rfl: 0 .  ondansetron (ZOFRAN) 4 MG tablet, , Disp: , Rfl:  .  pantoprazole (PROTONIX) 40 MG tablet, Take 40 mg by mouth daily. , Disp: , Rfl:  .  sitaGLIPtin-metformin (JANUMET) 50-500 MG tablet, Take 1 tablet by mouth daily., Disp: , Rfl:  .  sucralfate (CARAFATE) 1 g tablet, Take 1 g by mouth 4 (four) times daily -  with meals and at bedtime. , Disp: , Rfl:  .  WIXELA INHUB 250-50 MCG/DOSE AEPB, Inhale 1 puff into the lungs daily., Disp: 14 each, Rfl: 0   Allergies  Allergen Reactions  . Crab [Shellfish Allergy] Shortness Of Breath and Swelling  . Erythromycin Swelling and Rash  . Hm Lidocaine Patch [Lidocaine]     Burning on skin  . Fish Allergy     Swelling   . Metoclopramide Other (See Comments)    Slurred speech and unable to move muscles,  Caused her to drag her feet  .  Doxycycline     GI Intolerance  . Levaquin [Levofloxacin In D5w] Other (See Comments)    Causes irregular heart beat  . Morphine Nausea And Vomiting and Rash    Irregular heart beat  . Morphine And Related Nausea And Vomiting and Rash    Irregular heart beat  . Sulfa Antibiotics Rash    Past Medical History:  Diagnosis Date  . Arthritis   . Asthma   . Carotid stenosis, bilateral   . Chronic low back pain   . COPD (chronic obstructive pulmonary disease) ([place])   . Diabetes mellitus without complication ([place])    type II   . Diabetic neuropathy ([place])   . Diverticulitis   . Dyspnea    mild   . Frequent falls   . GERD (gastroesophageal reflux disease)   . History of bronchitis   . Hypercholesterolemia   . Hypertension   . IBS (irritable bowel syndrome)   . Osteoarthritis    knee  . Palpitations   . Sleep apnea    mild but no sleep apnea   . TMJ (dislocation of temporomandibular joint)   . 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 Vitamin D deficiency      Past Surgical History:  Procedure Laterality Date  . ABDOMINAL HYSTERECTOMY  1993  . CARPAL TUNNEL RELEASE Right   . CERVICAL DISC SURGERY  04/29/2015   Dr Tammi Klippel, [place], [place]  . ESOPHAGOGASTRODUODENOSCOPY ENDOSCOPY     with esophagus being stretched twice per pt,   . EYE SURGERY    . 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 feeding tube placement and removal   2015  . NASAL SEPTUM SURGERY    . spurs removed from esophagus   2015  . TONSILLECTOMY      Family History  Problem Relation Age of Onset  . Diabetes Father        type 2  . Heart disease Father   . Hypertension Father   . Heart disease Brother   . Hypertension Brother     Social History   Tobacco Use  . Smoking status: Former Smoker    Packs/day: 0.50    Years: 30.00    Pack years: 15.00    Quit date: 12/08/1994    Years since quitting: 25.7  . Smokeless tobacco: Never Used  Vaping Use  . Vaping Use: Never used  Substance Use Topics    . Alcohol use: No  . Drug use: No    ROS   Objective:   Vitals: BP 131/81 (BP Location: Right Arm)   Pulse 73   Temp 98.6 F (37 C) (Oral)   Resp 16  SpO2 97%   Physical Exam Constitutional:      General: She is not in acute distress.    Appearance: Normal appearance. She is well-developed. She is not ill-appearing, toxic-appearing or diaphoretic.  HENT:     Head: Normocephalic and atraumatic.     Nose: Nose normal.     Mouth/Throat:     Mouth: Mucous membranes are moist.  Eyes:     General: No scleral icterus.       Right eye: No discharge.        Left eye: No discharge.     Extraocular Movements: Extraocular movements intact.     Conjunctiva/sclera: Conjunctivae normal.     Pupils: Pupils are equal, round, and reactive to light.  Cardiovascular:     Rate and Rhythm: Normal rate and regular rhythm.     Pulses: Normal pulses.     Heart sounds: Normal heart sounds. No murmur heard.  No friction rub. No gallop.   Pulmonary:     Effort: Pulmonary effort is normal. No respiratory distress.     Breath sounds: Normal breath sounds. No stridor. No wheezing, rhonchi or rales.  Musculoskeletal:     Lumbar back: Tenderness present. No swelling, edema, deformity, signs of trauma, lacerations, spasms or bony tenderness. Decreased range of motion. Positive left straight leg raise test. Negative right straight leg raise test. No scoliosis.  Skin:    General: Skin is warm and dry.     Findings: No rash.  Neurological:     Mental Status: She is alert and oriented to person, place, and time.     Motor: No weakness.     Coordination: Coordination normal.     Gait: Gait normal.     Deep Tendon Reflexes: Reflexes normal.  Psychiatric:        Mood and Affect: Mood normal.        Behavior: Behavior normal.        Thought Content: Thought content normal.        Judgment: Judgment normal.      Assessment and Plan :   PDMP not reviewed this encounter.  1. Acute left-sided  low back pain with left-sided sciatica   2. Fatigue, unspecified type   3. Malaise   4. Nausea     COVID test pending. Recommended supportive care. Start prednisone to address sciatica. Use APAP, tizanidine otherwise. Counseled patient on potential for adverse effects with medications prescribed/recommended today, ER and return-to-clinic precautions discussed, patient verbalized understanding.    Jaynee Eagles, [place] 08/28/20 2058

## 2020-08-28 NOTE — Discharge Instructions (Signed)
Start prednisone, an oral steroid, to help with your sciatica tomorrow. Use tizanidine as a muscle relaxant to help with your back as well.

## 2020-08-29 LAB — SARS CORONAVIRUS 2 (TAT 6-24 HRS): SARS Coronavirus 2: NEGATIVE

## 2020-09-09 ENCOUNTER — Encounter (HOSPITAL_COMMUNITY): Payer: Self-pay

## 2020-09-09 ENCOUNTER — Emergency Department (HOSPITAL_COMMUNITY)
Admission: EM | Admit: 2020-09-09 | Discharge: 2020-09-10 | Disposition: A | Discharge status: Home / Self Care | Payer: Medicare HMO | Attending: Emergency Medicine | Admitting: Emergency Medicine

## 2020-09-09 ENCOUNTER — Other Ambulatory Visit: Payer: Self-pay

## 2020-09-09 ENCOUNTER — Emergency Department (HOSPITAL_COMMUNITY): Payer: Medicare HMO

## 2020-09-09 DIAGNOSIS — E1140 Type 2 diabetes mellitus with diabetic neuropathy, unspecified: Secondary | ICD-10-CM | POA: Diagnosis not present

## 2020-09-09 DIAGNOSIS — Z79899 Other long term (current) drug therapy: Secondary | ICD-10-CM | POA: Insufficient documentation

## 2020-09-09 DIAGNOSIS — Z7951 Long term (current) use of inhaled steroids: Secondary | ICD-10-CM | POA: Diagnosis not present

## 2020-09-09 DIAGNOSIS — J329 Chronic sinusitis, unspecified: Secondary | ICD-10-CM

## 2020-09-09 DIAGNOSIS — Z20822 Contact with and (suspected) exposure to covid-19: Secondary | ICD-10-CM | POA: Diagnosis not present

## 2020-09-09 DIAGNOSIS — Z87891 Personal history of nicotine dependence: Secondary | ICD-10-CM | POA: Insufficient documentation

## 2020-09-09 DIAGNOSIS — J019 Acute sinusitis, unspecified: Secondary | ICD-10-CM | POA: Diagnosis not present

## 2020-09-09 DIAGNOSIS — I10 Essential (primary) hypertension: Secondary | ICD-10-CM | POA: Diagnosis not present

## 2020-09-09 DIAGNOSIS — H9202 Otalgia, left ear: Secondary | ICD-10-CM | POA: Diagnosis present

## 2020-09-09 DIAGNOSIS — J45909 Unspecified asthma, uncomplicated: Secondary | ICD-10-CM | POA: Diagnosis not present

## 2020-09-09 DIAGNOSIS — B9689 Other specified bacterial agents as the cause of diseases classified elsewhere: Secondary | ICD-10-CM

## 2020-09-09 MED ORDER — DM-GUAIFENESIN ER 30-600 MG PO TB12
1.0000 | ORAL_TABLET | Freq: Two times a day (BID) | ORAL | Status: DC
  Administered 2020-09-10: 1 via ORAL
  Filled 2020-09-09: qty 1

## 2020-09-09 MED ORDER — ALBUTEROL SULFATE HFA 108 (90 BASE) MCG/ACT IN AERS
4.0000 | INHALATION_SPRAY | Freq: Once | RESPIRATORY_TRACT | Status: AC
  Administered 2020-09-10: 4 via RESPIRATORY_TRACT
  Filled 2020-09-09: qty 6.7

## 2020-09-09 NOTE — ED Provider Notes (Signed)
[facility] Provider Note   CSN: [id number] Arrival date & time: 09/09/20  2256     History Chief Complaint  Patient presents with  . Sore Throat    Pamela Roberson is a 64 y.o. female.  64 year old female with multiple medical problems as documented below who presents to the emergency department today with multiple symptoms.  She was exposed to Covid.  She has rhinorrhea, left ear pain, cough, sore throat.  She also has the sensation of her throat closing and the need to burp.  She currently does not have any respiratory distress.  No fevers that she knows of.   Sore Throat       Past Medical History:  Diagnosis Date  . Arthritis   . Asthma   . Carotid stenosis, bilateral   . Chronic low back pain   . COPD (chronic obstructive pulmonary disease) ([place])   . Diabetes mellitus without complication ([place])    type II   . Diabetic neuropathy ([place])   . Diverticulitis   . Dyspnea    mild   . Frequent falls   . GERD (gastroesophageal reflux disease)   . History of bronchitis   . Hypercholesterolemia   . Hypertension   . IBS (irritable bowel syndrome)   . Osteoarthritis    knee  . Palpitations   . Sleep apnea    mild but no sleep apnea   . TMJ (dislocation of temporomandibular joint)   . 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 Vitamin D deficiency     Patient Active Problem List   Diagnosis Date Noted  . Dizzy 12/11/2017  . Pharyngeal dysphagia 12/11/2017  . Post-nasal drip 12/11/2017  . Referred ear pain, bilateral 12/11/2017  . Diabetes mellitus without complication ([place]) 40/34/7425  . Family history of glaucoma 06/29/2017  . Hyperopia of both eyes with astigmatism and presbyopia 06/29/2017  . Keratoconjunctivitis sicca of both eyes not specified as Sjogren's 06/29/2017  . Nuclear sclerotic cataract of both eyes 06/29/2017  . Vitreous floater, bilateral 06/29/2017  . Biceps tendinitis of right shoulder 04/05/2017  .  Impingement syndrome of right shoulder 04/05/2017  . Arthritis of right acromioclavicular joint 04/05/2017  . Partial nontraumatic tear of rotator cuff, right 04/05/2017    Past Surgical History:  Procedure Laterality Date  . ABDOMINAL HYSTERECTOMY  1993  . CARPAL TUNNEL RELEASE Right   . CERVICAL DISC SURGERY  04/29/2015   Dr Tammi Klippel, [place], [place]  . ESOPHAGOGASTRODUODENOSCOPY ENDOSCOPY     with esophagus being stretched twice per pt,   . EYE SURGERY    . 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 feeding tube placement and removal   2015  . NASAL SEPTUM SURGERY    . spurs removed from esophagus   2015  . TONSILLECTOMY       OB History   No obstetric history on file.     Family History  Problem Relation Age of Onset  . Diabetes Father        type 2  . Heart disease Father   . Hypertension Father   . Heart disease Brother   . Hypertension Brother     Social History   Tobacco Use  . Smoking status: Former Smoker    Packs/day: 0.50    Years: 30.00    Pack years: 15.00    Quit date: 12/08/1994    Years since quitting: 25.7  . Smokeless tobacco: Never  Used  Vaping Use  . Vaping Use: Never used  Substance Use Topics  . Alcohol use: No  . Drug use: No    Home Medications Prior to Admission medications   Medication Sig Start Date End Date Taking? Authorizing Provider  acyclovir (ZOVIRAX) 400 MG tablet Take 400 mg by mouth 2 (two) times daily.    [provider]  albuterol (PROAIR HFA) 108 (90 Base) MCG/ACT inhaler Inhale 1-2 puffs into the lungs every 6 (six) hours as needed for wheezing or shortness of breath. 06/14/20   Zigmund Gottron, NP  albuterol (PROVENTIL) (2.5 MG/3ML) 0.083% nebulizer solution Take 3 mLs (2.5 mg total) by nebulization every 4 (four) hours as needed for wheezing or shortness of breath. 03/11/19   Raylene Everts, MD  aluminum-magnesium hydroxide-simethicone (MAALOX) 200-200-20 MG/5ML SUSP Take 30 mLs by mouth 4 (four) times daily -   before meals and at bedtime. Patient not taking: Reported on 07/08/2020 04/21/19   Augusto Gamble B, NP  amoxicillin-clavulanate (AUGMENTIN) 875-125 MG tablet Take 1 tablet by mouth 2 (two) times daily. One po bid x 7 days 09/10/20   Lerone Onder, Corene Cornea, MD  Artificial Tear Ointment (DRY EYES OP) Apply 1-2 drops to eye daily as needed (for dry eyes).    [provider]  atorvastatin (LIPITOR) 10 MG tablet Take 10 mg by mouth daily.    [provider]  azithromycin (ZITHROMAX) 250 MG tablet Take 1 tablet (250 mg total) by mouth daily. Take first 2 tablets together, then 1 every day until finished. 08/06/20   Vanessa Kick, MD  dicyclomine (BENTYL) 20 MG tablet Take 20 mg by mouth 3 (three) times daily. 10/17/19   [provider]  dilTIAZem HCl POWD  07/09/20   [provider]  EPINEPHrine 0.3 mg/0.3 mL IJ SOAJ injection Inject 0.3 mg into the muscle once as needed (allergic reactions).     [provider]  fexofenadine (ALLEGRA) 180 MG tablet Take 1 tablet (180 mg total) by mouth daily. 09/07/19   Shelda Pal, DO  fluconazole (DIFLUCAN) 150 MG tablet Take one tablet by mouth as a single dose. May repeat in 3 days if symptoms persist. 08/06/20   Vanessa Kick, MD  gabapentin (NEURONTIN) 300 MG capsule Take 300 mg by mouth 3 (three) times daily.    [provider]  ibuprofen (ADVIL) 600 MG tablet Take 1 tablet (600 mg total) by mouth every 8 (eight) hours as needed for headache. 07/13/19   Zigmund Gottron, NP  lidocaine (LIDODERM) 5 % SMARTSIG:Topical 07/30/20   [provider]  metoprolol tartrate (LOPRESSOR) 25 MG tablet Take 25 mg by mouth 2 (two) times daily.    [provider]  montelukast (SINGULAIR) 10 MG tablet Take 10 mg by mouth every morning.     [provider]  Multiple Vitamin (MULTIVITAMIN WITH MINERALS) TABS tablet Take 1 tablet by mouth daily.    [provider]  nystatin cream (MYCOSTATIN) Apply to  affected area 2 times daily Patient taking differently: Apply 1 application topically 2 (two) times daily.  07/13/19   Zigmund Gottron, NP  ondansetron (ZOFRAN) 4 MG tablet  08/07/20   [provider]  ondansetron (ZOFRAN-ODT) 8 MG disintegrating tablet Take 1 tablet (8 mg total) by mouth every 8 (eight) hours as needed for nausea or vomiting. 08/28/20   Jaynee Eagles, PA-C  pantoprazole (PROTONIX) 40 MG tablet Take 40 mg by mouth daily.  11/20/19   [provider]  predniSONE (DELTASONE) 20 MG tablet Take 2 tablets daily with breakfast. 08/28/20   Jaynee Eagles, PA-C  sitaGLIPtin-metformin (JANUMET) 50-500 MG tablet Take 1 tablet by mouth daily.    [provider]  sucralfate (CARAFATE) 1 g tablet Take 1 g by mouth 4 (four) times daily -  with meals and at bedtime.  02/11/20   [provider]  tiZANidine (ZANAFLEX) 4 MG tablet Take 1 tablet (4 mg total) by mouth every 8 (eight) hours as needed. 08/28/20   Jaynee Eagles, PA-C  WIXELA INHUB 250-50 MCG/DOSE AEPB Inhale 1 puff into the lungs daily. 08/06/20   Vanessa Kick, MD  esomeprazole (NEXIUM) 40 MG capsule Take 1 capsule (40 mg total) by mouth daily. Patient not taking: Reported on 07/08/2020 04/24/19 08/06/20  Charlesetta Shanks, MD  fluticasone [facility]) 50 MCG/ACT nasal spray Place 2 sprays into both nostrils daily. Patient taking differently: Place 2 sprays into both nostrils daily as needed for allergies.  06/08/18 12/09/19  Raylene Everts, MD    Allergies    Otho Darner allergy], Erythromycin, Hm lidocaine patch [lidocaine], Fish allergy, Metoclopramide, Doxycycline, Levaquin [levofloxacin in d5w], Morphine, Morphine and related, and Sulfa antibiotics  Review of Systems   Review of Systems  All other systems reviewed and are negative.   Physical Exam Updated Vital Signs BP (!) 144/91 (BP Location: Left Arm)   Pulse 84   Temp 98.4 F (36.9 C) (Oral)   Resp (!) 23   SpO2 100%   Physical Exam Vitals and  nursing note reviewed.  Constitutional:      Appearance: She is well-developed.  HENT:     Head: Normocephalic and atraumatic.     Right Ear: A middle ear effusion is present. Tympanic membrane is not erythematous, retracted or bulging.     Left Ear: Tympanic membrane normal.  No middle ear effusion. Tympanic membrane is not erythematous, retracted or bulging.     Ears:     Comments: Small r effusion without erythema, bulging or other abnormalities    Mouth/Throat:     Mouth: Mucous membranes are moist.  Cardiovascular:     Rate and Rhythm: Normal rate and regular rhythm.  Pulmonary:     Effort: No respiratory distress.     Breath sounds: No stridor.  Abdominal:     General: There is no distension.  Musculoskeletal:     Cervical back: Normal range of motion.  Neurological:     Mental Status: She is alert.     ED Results / Procedures / Treatments   Labs (all labs ordered are listed, but only abnormal results are displayed) Labs Reviewed  RESPIRATORY PANEL BY RT PCR (FLU A&B, COVID)    EKG EKG Interpretation  Date/Time:  Thursday September 10 2020 00:16:16 EDT Ventricular Rate:  89 PR Interval:    QRS Duration: 116 QT Interval:  402 QTC Calculation: 490 R Axis:   18 Text Interpretation: Sinus rhythm Nonspecific intraventricular conduction delay Low voltage, precordial leads Borderline T abnormalities, anterior leads Minimal ST elevation, inferior leads Baseline wander in lead(s) I III aVR aVL baseline wander, poor ecg, needs repeated, no charge Confirmed by Merrily Pew [id number]) on 09/10/2020 12:52:36 AM   Radiology DG Chest Portable 1 View  Result Date: 09/09/2020 CLINICAL DATA:  COVID exposure EXAM: PORTABLE CHEST 1 VIEW COMPARISON:  07/08/2020 FINDINGS: The heart size and mediastinal contours are within normal limits. Both lungs are clear. The visualized skeletal structures are unremarkable. IMPRESSION: No active disease. Electronically Signed  By: Fidela Salisbury MD    On: 09/09/2020 23:43    Procedures Procedures (including critical care time)  Medications Ordered in ED Medications  dextromethorphan-guaiFENesin (MUCINEX DM) 30-600 MG per 12 hr tablet 1 tablet (1 tablet Oral Given 09/10/20 0019)  albuterol (VENTOLIN HFA) 108 (90 Base) MCG/ACT inhaler 4 puff (4 puffs Inhalation Given 09/10/20 0018)  amoxicillin-clavulanate (AUGMENTIN) 875-125 MG per tablet 1 tablet (1 tablet Oral Given 09/10/20 0119)    ED Course  I have reviewed the triage vital signs and the nursing notes.  Pertinent labs & imaging results that were available during my care of the patient were reviewed by me and considered in my medical decision making (see chart for details).    MDM Rules/Calculators/A&P                         Viral vs bacterial sinus infection.  Negative covid. 1 week of symptoms, will initiate abx, pcp follow up.   Final Clinical Impression(s) / ED Diagnoses Final diagnoses:  Bacterial sinusitis    Rx / DC Orders ED Discharge Orders         Ordered    amoxicillin-clavulanate (AUGMENTIN) 875-125 MG tablet  2 times daily        09/10/20 0113           Rosalina Dingwall, Corene Cornea, MD 09/10/20 8016

## 2020-09-09 NOTE — ED Triage Notes (Signed)
Patient arrived with complaints of ear pain that has now radiated to her throat, states she feels like it is tight. States she also had "bad fish" last night and has the feeling that she needs to burp but is unable to. Reports her daughter who lives with her was also diagnosed with Covid-19 today, patient unvaccinated.

## 2020-09-10 ENCOUNTER — Other Ambulatory Visit: Payer: Self-pay

## 2020-09-10 DIAGNOSIS — J019 Acute sinusitis, unspecified: Secondary | ICD-10-CM | POA: Diagnosis not present

## 2020-09-10 LAB — RESPIRATORY PANEL BY RT PCR (FLU A&B, COVID)
Influenza A by PCR: NEGATIVE
Influenza B by PCR: NEGATIVE
SARS Coronavirus 2 by RT PCR: NEGATIVE

## 2020-09-10 MED ORDER — AMOXICILLIN-POT CLAVULANATE 875-125 MG PO TABS
1.0000 | ORAL_TABLET | Freq: Once | ORAL | Status: AC
  Administered 2020-09-10: 1 via ORAL
  Filled 2020-09-10: qty 1

## 2020-09-10 MED ORDER — AMOXICILLIN-POT CLAVULANATE 875-125 MG PO TABS
1.0000 | ORAL_TABLET | Freq: Two times a day (BID) | ORAL | 0 refills | Status: DC
Start: 2020-09-10 — End: 2020-10-23

## 2020-09-15 ENCOUNTER — Encounter: Payer: Self-pay | Admitting: Nurse Practitioner

## 2020-09-15 ENCOUNTER — Ambulatory Visit (HOSPITAL_COMMUNITY)
Admission: RE | Admit: 2020-09-15 | Discharge: 2020-09-15 | Disposition: A | Discharge status: Home / Self Care | Payer: Medicare Other | Source: Ambulatory Visit | Attending: Pulmonary Disease | Admitting: Pulmonary Disease

## 2020-09-15 ENCOUNTER — Telehealth: Payer: Self-pay | Admitting: Nurse Practitioner

## 2020-09-15 ENCOUNTER — Other Ambulatory Visit: Payer: Self-pay | Admitting: Nurse Practitioner

## 2020-09-15 DIAGNOSIS — E119 Type 2 diabetes mellitus without complications: Secondary | ICD-10-CM

## 2020-09-15 DIAGNOSIS — Z23 Encounter for immunization: Secondary | ICD-10-CM | POA: Insufficient documentation

## 2020-09-15 DIAGNOSIS — I10 Essential (primary) hypertension: Secondary | ICD-10-CM

## 2020-09-15 DIAGNOSIS — U071 COVID-19: Secondary | ICD-10-CM

## 2020-09-15 DIAGNOSIS — J45909 Unspecified asthma, uncomplicated: Secondary | ICD-10-CM

## 2020-09-15 DIAGNOSIS — J209 Acute bronchitis, unspecified: Secondary | ICD-10-CM | POA: Diagnosis present

## 2020-09-15 DIAGNOSIS — J440 Chronic obstructive pulmonary disease with acute lower respiratory infection: Secondary | ICD-10-CM | POA: Diagnosis present

## 2020-09-15 MED ORDER — SODIUM CHLORIDE 0.9 % IV SOLN
Freq: Once | INTRAVENOUS | Status: AC
  Filled 2020-09-15: qty 20

## 2020-09-15 MED ORDER — FAMOTIDINE IN NACL 20-0.9 MG/50ML-% IV SOLN: 20.0000 mg | Freq: Once | INTRAVENOUS | Status: DC | PRN

## 2020-09-15 MED ORDER — ONDANSETRON HCL 4 MG/2ML IJ SOLN
4.0000 mg | Freq: Once | INTRAMUSCULAR | Status: AC
  Administered 2020-09-15: 4 mg via INTRAVENOUS
  Filled 2020-09-15: qty 2

## 2020-09-15 MED ORDER — SODIUM CHLORIDE 0.9 % IV BOLUS
1000.0000 mL | Freq: Once | INTRAVENOUS | Status: AC
  Administered 2020-09-15: 1000 mL via INTRAVENOUS

## 2020-09-15 MED ORDER — METHYLPREDNISOLONE SODIUM SUCC 125 MG IJ SOLR: 125.0000 mg | Freq: Once | INTRAMUSCULAR | Status: DC | PRN

## 2020-09-15 MED ORDER — SODIUM CHLORIDE 0.9 % IV SOLN: INTRAVENOUS | Status: DC | PRN

## 2020-09-15 MED ORDER — ACETAMINOPHEN 325 MG PO TABS
650.0000 mg | ORAL_TABLET | Freq: Four times a day (QID) | ORAL | Status: DC | PRN
  Administered 2020-09-15: 650 mg via ORAL
  Filled 2020-09-15: qty 2

## 2020-09-15 MED ORDER — ALBUTEROL SULFATE HFA 108 (90 BASE) MCG/ACT IN AERS: 2.0000 | INHALATION_SPRAY | Freq: Once | RESPIRATORY_TRACT | Status: DC | PRN

## 2020-09-15 MED ORDER — DIPHENHYDRAMINE HCL 50 MG/ML IJ SOLN: 50.0000 mg | Freq: Once | INTRAMUSCULAR | Status: DC | PRN

## 2020-09-15 MED ORDER — EPINEPHRINE 0.3 MG/0.3ML IJ SOAJ: 0.3000 mg | Freq: Once | INTRAMUSCULAR | Status: DC | PRN

## 2020-09-15 NOTE — Discharge Instructions (Signed)
COVID-19 COVID-19 is a respiratory infection that is caused by a virus called severe acute respiratory syndrome coronavirus 2 (SARS-CoV-2). The disease is also known as coronavirus disease or novel coronavirus. In some people, the virus may not cause any symptoms. In others, it may cause a serious infection. The infection can get worse quickly and can lead to complications, such as:  Pneumonia, or infection of the lungs.  Acute respiratory distress syndrome or ARDS. This is a condition in which fluid build-up in the lungs prevents the lungs from filling with air and passing oxygen into the blood.  Acute respiratory failure. This is a condition in which there is not enough oxygen passing from the lungs to the body or when carbon dioxide is not passing from the lungs out of the body.  Sepsis or septic shock. This is a serious bodily reaction to an infection.  Blood clotting problems.  Secondary infections due to bacteria or fungus.  Organ failure. This is when your body's organs stop working. The virus that causes COVID-19 is contagious. This means that it can spread from person to person through droplets from coughs and sneezes (respiratory secretions). What are the causes? This illness is caused by a virus. You may catch the virus by:  Breathing in droplets from an infected person. Droplets can be spread by a person breathing, speaking, singing, coughing, or sneezing.  Touching something, like a table or a doorknob, that was exposed to the virus (contaminated) and then touching your mouth, nose, or eyes. What increases the risk? Risk for infection You are more likely to be infected with this virus if you:  Are within 6 feet (2 meters) of a person with COVID-19.  Provide care for or live with a person who is infected with COVID-19.  Spend time in crowded indoor spaces or live in shared housing. Risk for serious illness You are more likely to become seriously ill from the virus if  you:  Are 50 years of age or older. The higher your age, the more you are at risk for serious illness.  Live in a nursing home or long-term care facility.  Have cancer.  Have a long-term (chronic) disease such as: ? Chronic lung disease, including chronic obstructive pulmonary disease or asthma. ? A long-term disease that lowers your body's ability to fight infection (immunocompromised). ? Heart disease, including heart failure, a condition in which the arteries that lead to the heart become narrow or blocked (coronary artery disease), a disease which makes the heart muscle thick, weak, or stiff (cardiomyopathy). ? Diabetes. ? Chronic kidney disease. ? Sickle cell disease, a condition in which red blood cells have an abnormal "sickle" shape. ? Liver disease.  Are obese. What are the signs or symptoms? Symptoms of this condition can range from mild to severe. Symptoms may appear any time from 2 to 14 days after being exposed to the virus. They include:  A fever or chills.  A cough.  Difficulty breathing.  Headaches, body aches, or muscle aches.  Runny or stuffy (congested) nose.  A sore throat.  New loss of taste or smell. Some people may also have stomach problems, such as nausea, vomiting, or diarrhea. Other people may not have any symptoms of COVID-19. How is this diagnosed? This condition may be diagnosed based on:  Your signs and symptoms, especially if: ? You live in an area with a COVID-19 outbreak. ? You recently traveled to or from an area where the virus is common. ? You   provide care for or live with a person who was diagnosed with COVID-19. ? You were exposed to a person who was diagnosed with COVID-19.  A physical exam.  Lab tests, which may include: ? Taking a sample of fluid from the back of your nose and throat (nasopharyngeal fluid), your nose, or your throat using a swab. ? A sample of mucus from your lungs (sputum). ? Blood tests.  Imaging tests,  which may include, X-rays, CT scan, or ultrasound. How is this treated? At present, there is no medicine to treat COVID-19. Medicines that treat other diseases are being used on a trial basis to see if they are effective against COVID-19. Your health care provider will talk with you about ways to treat your symptoms. For most people, the infection is mild and can be managed at home with rest, fluids, and over-the-counter medicines. Treatment for a serious infection usually takes places in a hospital intensive care unit (ICU). It may include one or more of the following treatments. These treatments are given until your symptoms improve.  Receiving fluids and medicines through an IV.  Supplemental oxygen. Extra oxygen is given through a tube in the nose, a face mask, or a hood.  Positioning you to lie on your stomach (prone position). This makes it easier for oxygen to get into the lungs.  Continuous positive airway pressure (CPAP) or bi-level positive airway pressure (BPAP) machine. This treatment uses mild air pressure to keep the airways open. A tube that is connected to a motor delivers oxygen to the body.  Ventilator. This treatment moves air into and out of the lungs by using a tube that is placed in your windpipe.  Tracheostomy. This is a procedure to create a hole in the neck so that a breathing tube can be inserted.  Extracorporeal membrane oxygenation (ECMO). This procedure gives the lungs a chance to recover by taking over the functions of the heart and lungs. It supplies oxygen to the body and removes carbon dioxide. Follow these instructions at home: Lifestyle  If you are sick, stay home except to get medical care. Your health care provider will tell you how long to stay home. Call your health care provider before you go for medical care.  Rest at home as told by your health care provider.  Do not use any products that contain nicotine or tobacco, such as cigarettes,  e-cigarettes, and chewing tobacco. If you need help quitting, ask your health care provider.  Return to your normal activities as told by your health care provider. Ask your health care provider what activities are safe for you. General instructions  Take over-the-counter and prescription medicines only as told by your health care provider.  Drink enough fluid to keep your urine pale yellow.  Keep all follow-up visits as told by your health care provider. This is important. How is this prevented?  There is no vaccine to help prevent COVID-19 infection. However, there are steps you can take to protect yourself and others from this virus. To protect yourself:   Do not travel to areas where COVID-19 is a risk. The areas where COVID-19 is reported change often. To identify high-risk areas and travel restrictions, check the CDC travel website: wwwnc.cdc.gov/travel/notices  If you live in, or must travel to, an area where COVID-19 is a risk, take precautions to avoid infection. ? Stay away from people who are sick. ? Wash your hands often with soap and water for 20 seconds. If soap and water   are not available, use an alcohol-based hand sanitizer. ? Avoid touching your mouth, face, eyes, or nose. ? Avoid going out in public, follow guidance from your state and local health authorities. ? If you must go out in public, wear a cloth face covering or face mask. Make sure your mask covers your nose and mouth. ? Avoid crowded indoor spaces. Stay at least 6 feet (2 meters) away from others. ? Disinfect objects and surfaces that are frequently touched every day. This may include:  Counters and tables.  Doorknobs and light switches.  Sinks and faucets.  Electronics, such as phones, remote controls, keyboards, computers, and tablets. To protect others: If you have symptoms of COVID-19, take steps to prevent the virus from spreading to others.  If you think you have a COVID-19 infection, contact  your health care provider right away. Tell your health care team that you think you may have a COVID-19 infection.  Stay home. Leave your house only to seek medical care. Do not use public transport.  Do not travel while you are sick.  Wash your hands often with soap and water for 20 seconds. If soap and water are not available, use alcohol-based hand sanitizer.  Stay away from other members of your household. Let healthy household members care for children and pets, if possible. If you have to care for children or pets, wash your hands often and wear a mask. If possible, stay in your own room, separate from others. Use a different bathroom.  Make sure that all people in your household wash their hands well and often.  Cough or sneeze into a tissue or your sleeve or elbow. Do not cough or sneeze into your hand or into the air.  Wear a cloth face covering or face mask. Make sure your mask covers your nose and mouth. Where to find more information  Centers for Disease Control and Prevention: www.cdc.gov/coronavirus/2019-ncov/index.html  World Health Organization: www.who.int/health-topics/coronavirus Contact a health care provider if:  You live in or have traveled to an area where COVID-19 is a risk and you have symptoms of the infection.  You have had contact with someone who has COVID-19 and you have symptoms of the infection. Get help right away if:  You have trouble breathing.  You have pain or pressure in your chest.  You have confusion.  You have bluish lips and fingernails.  You have difficulty waking from sleep.  You have symptoms that get worse. These symptoms may represent a serious problem that is an emergency. Do not wait to see if the symptoms will go away. Get medical help right away. Call your local emergency services (911 in the [place] Do not drive yourself to the hospital. Let the emergency medical personnel know if you think you have  COVID-19. Summary  COVID-19 is a respiratory infection that is caused by a virus. It is also known as coronavirus disease or novel coronavirus. It can cause serious infections, such as pneumonia, acute respiratory distress syndrome, acute respiratory failure, or sepsis.  The virus that causes COVID-19 is contagious. This means that it can spread from person to person through droplets from breathing, speaking, singing, coughing, or sneezing.  You are more likely to develop a serious illness if you are 50 years of age or older, have a weak immune system, live in a nursing home, or have chronic disease.  There is no medicine to treat COVID-19. Your health care provider will talk with you about ways to treat your symptoms.    Take steps to protect yourself and others from infection. Wash your hands often and disinfect objects and surfaces that are frequently touched every day. Stay away from people who are sick and wear a mask if you are sick. This information is not intended to replace advice given to you by your health care provider. Make sure you discuss any questions you have with your health care provider. Document Revised: 09/20/2019 Document Reviewed: 12/27/2018 Elsevier Patient Education  2020 [employer]. What types of side effects do monoclonal antibody drugs cause?  Common side effects  In general, the more common side effects caused by monoclonal antibody drugs include: . Allergic reactions, such as hives or itching . Flu-like signs and symptoms, including chills, fatigue, fever, and muscle aches and pains . Nausea, vomiting . Diarrhea . Skin rashes . Low blood pressure   The CDC is recommending patients who receive monoclonal antibody treatments wait at least 90 days before being vaccinated.  Currently, there are no data on the safety and efficacy of mRNA COVID-19 vaccines in persons who received monoclonal antibodies or convalescent plasma as part of COVID-19 treatment. Based  on the estimated half-life of such therapies as well as evidence suggesting that reinfection is uncommon in the 90 days after initial infection, vaccination should be deferred for at least 90 days, as a precautionary measure until additional information becomes available, to avoid interference of the antibody treatment with vaccine-induced immune responses. 

## 2020-09-15 NOTE — Progress Notes (Signed)
I connected by phone with Pamela Roberson on 09/15/2020 at 9:41 AM to discuss the potential use of a new treatment for mild to moderate COVID-19 viral infection in non-hospitalized patients.  This patient is a 64 y.o. female that meets the FDA criteria for Emergency Use Authorization of COVID monoclonal antibody casirivimab/imdevimab or bamlanivimab/eteseviamb.  Has a (+) direct SARS-CoV-2 viral test result  Has mild or moderate COVID-19   Is NOT hospitalized due to COVID-19  Is within 10 days of symptom onset  Has at least one of the high risk factor(s) for progression to severe COVID-19 and/or hospitalization as defined in EUA.  Specific high risk criteria : BMI > 25, Diabetes, Cardiovascular disease or hypertension and Chronic Lung Disease   Sx: 10/6    I have spoken and communicated the following to the patient or parent/caregiver regarding COVID monoclonal antibody treatment:  1. FDA has authorized the emergency use for the treatment of mild to moderate COVID-19 in adults and pediatric patients with positive results of direct SARS-CoV-2 viral testing who are 40 years of age and older weighing at least 40 kg, and who are at high risk for progressing to severe COVID-19 and/or hospitalization.  2. The significant known and potential risks and benefits of COVID monoclonal antibody, and the extent to which such potential risks and benefits are unknown.  3. Information on available alternative treatments and the risks and benefits of those alternatives, including clinical trials.  4. Patients treated with COVID monoclonal antibody should continue to self-isolate and use infection control measures (e.g., wear mask, isolate, social distance, avoid sharing personal items, clean and disinfect "high touch" surfaces, and frequent handwashing) according to CDC guidelines.   5. The patient or parent/caregiver has the option to accept or refuse COVID monoclonal antibody treatment.  After  reviewing this information with the patient, the patient has agreed to receive one of the available covid 19 monoclonal antibodies and will be provided an appropriate fact sheet prior to infusion. Orma Render, NP 09/15/2020 9:41 AM

## 2020-09-15 NOTE — Progress Notes (Signed)
  Diagnosis: COVID-19  Physician:Dr Joya Gaskins  Procedure: Cov[facility] Infusion Clinic Med: bamlanivimab\etesevimab infusion - Provided patient with bamlanimivab\etesevimab fact sheet for patients, parents and caregivers prior to infusion.  Complications: No immediate complications noted.  Discharge: Discharged home   [place], [place] 09/15/2020

## 2020-09-15 NOTE — Telephone Encounter (Signed)
COVID MAB Infusion Contact Note   Qualifiers: COPD, Asthma, HTN, CAD, and DM Symptoms: Unknown Symptom Onset: Unknown   Contact via telephone to discuss symptoms and evaluate for interest and qualifications for MAB Infusion treatment. Patient qualifies for at-risk group according to the FDA Emergency Use Authorization.   Unable to reach patient.  VM left with reason for call and call back information for MAB Infusion hotline at [phone] or text message to cell. MyChart message also sent for patient to reply.   Worthy Keeler, DNP, AGNP-c COVID-19 M[facility] Infusion Group [phone]

## 2020-09-17 ENCOUNTER — Emergency Department (HOSPITAL_COMMUNITY): Payer: Medicare HMO

## 2020-09-17 ENCOUNTER — Encounter (HOSPITAL_COMMUNITY): Payer: Self-pay

## 2020-09-17 ENCOUNTER — Other Ambulatory Visit: Payer: Self-pay

## 2020-09-17 ENCOUNTER — Emergency Department (HOSPITAL_COMMUNITY)
Admission: EM | Admit: 2020-09-17 | Discharge: 2020-09-17 | Disposition: A | Discharge status: Home / Self Care | Payer: Medicare HMO | Attending: Emergency Medicine | Admitting: Emergency Medicine

## 2020-09-17 DIAGNOSIS — J45909 Unspecified asthma, uncomplicated: Secondary | ICD-10-CM | POA: Diagnosis not present

## 2020-09-17 DIAGNOSIS — E1140 Type 2 diabetes mellitus with diabetic neuropathy, unspecified: Secondary | ICD-10-CM | POA: Diagnosis not present

## 2020-09-17 DIAGNOSIS — I10 Essential (primary) hypertension: Secondary | ICD-10-CM | POA: Insufficient documentation

## 2020-09-17 DIAGNOSIS — R0602 Shortness of breath: Secondary | ICD-10-CM

## 2020-09-17 DIAGNOSIS — J449 Chronic obstructive pulmonary disease, unspecified: Secondary | ICD-10-CM | POA: Insufficient documentation

## 2020-09-17 DIAGNOSIS — U071 COVID-19: Secondary | ICD-10-CM | POA: Insufficient documentation

## 2020-09-17 DIAGNOSIS — Z79899 Other long term (current) drug therapy: Secondary | ICD-10-CM | POA: Insufficient documentation

## 2020-09-17 DIAGNOSIS — Z87891 Personal history of nicotine dependence: Secondary | ICD-10-CM | POA: Diagnosis not present

## 2020-09-17 DIAGNOSIS — R509 Fever, unspecified: Secondary | ICD-10-CM | POA: Diagnosis present

## 2020-09-17 LAB — URINALYSIS, ROUTINE W REFLEX MICROSCOPIC
Bilirubin Urine: NEGATIVE
Glucose, UA: NEGATIVE mg/dL
Hgb urine dipstick: NEGATIVE
Ketones, ur: NEGATIVE mg/dL
Leukocytes,Ua: NEGATIVE
Nitrite: NEGATIVE
Protein, ur: NEGATIVE mg/dL
Specific Gravity, Urine: 1.003 — ABNORMAL LOW (ref 1.005–1.030)
pH: 6 (ref 5.0–8.0)

## 2020-09-17 LAB — COMPREHENSIVE METABOLIC PANEL
ALT: 24 U/L (ref 0–44)
AST: 40 U/L (ref 15–41)
Albumin: 3.4 g/dL — ABNORMAL LOW (ref 3.5–5.0)
Alkaline Phosphatase: 50 U/L (ref 38–126)
Anion gap: 9 (ref 5–15)
BUN: 9 mg/dL (ref 8–23)
CO2: 28 mmol/L (ref 22–32)
Calcium: 8.5 mg/dL — ABNORMAL LOW (ref 8.9–10.3)
Chloride: 97 mmol/L — ABNORMAL LOW (ref 98–111)
Creatinine, Ser: 0.96 mg/dL (ref 0.44–1.00)
GFR, Estimated: >60 mL/min (ref >60)
Glucose, Bld: 102 mg/dL — ABNORMAL HIGH (ref 70–99)
Potassium: 3.8 mmol/L (ref 3.5–5.1)
Sodium: 134 mmol/L — ABNORMAL LOW (ref 135–145)
Total Bilirubin: 0.7 mg/dL (ref 0.3–1.2)
Total Protein: 6 g/dL — ABNORMAL LOW (ref 6.5–8.1)

## 2020-09-17 LAB — RESPIRATORY PANEL BY RT PCR (FLU A&B, COVID)
Influenza A by PCR: NEGATIVE
Influenza B by PCR: NEGATIVE
SARS Coronavirus 2 by RT PCR: POSITIVE — AB

## 2020-09-17 LAB — CBC WITH DIFFERENTIAL/PLATELET
Abs Immature Granulocytes: 0.01 10*3/uL (ref 0.00–0.07)
Basophils Absolute: 0 10*3/uL (ref 0.0–0.1)
Basophils Relative: 0 %
Eosinophils Absolute: 0 10*3/uL (ref 0.0–0.5)
Eosinophils Relative: 0 %
HCT: 40.9 % (ref 36.0–46.0)
Hemoglobin: 13.3 g/dL (ref 12.0–15.0)
Immature Granulocytes: 0 %
Lymphocytes Relative: 52 %
Lymphs Abs: 1.2 10*3/uL (ref 0.7–4.0)
MCH: 29.1 pg (ref 26.0–34.0)
MCHC: 32.5 g/dL (ref 30.0–36.0)
MCV: 89.5 fL (ref 80.0–100.0)
Monocytes Absolute: 0.3 10*3/uL (ref 0.1–1.0)
Monocytes Relative: 11 %
Neutro Abs: 0.9 10*3/uL — ABNORMAL LOW (ref 1.7–7.7)
Neutrophils Relative %: 37 %
Platelets: 136 10*3/uL — ABNORMAL LOW (ref 150–400)
RBC: 4.57 MIL/uL (ref 3.87–5.11)
RDW: 12.8 % (ref 11.5–15.5)
WBC Morphology: >10
WBC: 2.4 10*3/uL — ABNORMAL LOW (ref 4.0–10.5)
nRBC: 0 % (ref 0.0–0.2)

## 2020-09-17 MED ORDER — SODIUM CHLORIDE 0.9 % IV BOLUS
1000.0000 mL | Freq: Once | INTRAVENOUS | Status: AC
  Administered 2020-09-17: 1000 mL via INTRAVENOUS

## 2020-09-17 NOTE — Discharge Instructions (Addendum)
Please return for any problem.  °

## 2020-09-17 NOTE — ED Provider Notes (Signed)
[facility] EMERGENCY DEPARTMENT Provider Note  CSN: [id number] Arrival date & time: 09/17/20 1328    History Chief Complaint  Patient presents with  . Fever  . Covid Positive    HPI  Pamela Roberson is a 64 y.o. female who reports she recently had a Covid MAB infusion 2 days ago, she did not test positive for Covid, but she has multiple medical problems and a household covid exposure and it seems she was referred to [facility] for prophylactic infusion. Since the infusion she has had increasing generalized weakness mostly in her legs but has not fallen or passed out. She reports she was febrile at the Covid infusion clinic and has continued to have intermittent fevers at home since then. She reports general malaise, dry cough and decreased appetite.   Past Medical History:  Diagnosis Date  . Arthritis   . Asthma   . Carotid stenosis, bilateral   . Chronic low back pain   . COPD (chronic obstructive pulmonary disease) ([place])   . Diabetes mellitus without complication ([place])    type II   . Diabetic neuropathy ([place])   . Diverticulitis   . Dyspnea    mild   . Frequent falls   . GERD (gastroesophageal reflux disease)   . History of bronchitis   . Hypercholesterolemia   . Hypertension   . IBS (irritable bowel syndrome)   . Osteoarthritis    knee  . Palpitations   . Sleep apnea    mild but no sleep apnea   . TMJ (dislocation of temporomandibular joint)   . 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 Vitamin D deficiency     Past Surgical History:  Procedure Laterality Date  . ABDOMINAL HYSTERECTOMY  1993  . CARPAL TUNNEL RELEASE Right   . CERVICAL DISC SURGERY  04/29/2015   Dr Tammi Klippel, [place], [place]  . ESOPHAGOGASTRODUODENOSCOPY ENDOSCOPY     with esophagus being stretched twice per pt,   . EYE SURGERY    . 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 feeding tube placement and removal   2015  . NASAL SEPTUM SURGERY    . spurs removed from esophagus   2015  .  TONSILLECTOMY      Family History  Problem Relation Age of Onset  . Diabetes Father        type 2  . Heart disease Father   . Hypertension Father   . Heart disease Brother   . Hypertension Brother     Social History   Tobacco Use  . Smoking status: Former Smoker    Packs/day: 0.50    Years: 30.00    Pack years: 15.00    Quit date: 12/08/1994    Years since quitting: 25.7  . Smokeless tobacco: Never Used  Vaping Use  . Vaping Use: Never used  Substance Use Topics  . Alcohol use: No  . Drug use: No     Home Medications Prior to Admission medications   Medication Sig Start Date End Date Taking? Authorizing Provider  acyclovir (ZOVIRAX) 400 MG tablet Take 400 mg by mouth 2 (two) times daily.   Yes [provider]  albuterol (PROAIR HFA) 108 (90 Base) MCG/ACT inhaler Inhale 1-2 puffs into the lungs every 6 (six) hours as needed for wheezing or shortness of breath. 06/14/20  Yes Burky, Lanelle Bal B, NP  albuterol (PROVENTIL) (2.5 MG/3ML) 0.083% nebulizer solution Take 3 mLs (2.5 mg total) by  nebulization every 4 (four) hours as needed for wheezing or shortness of breath. 03/11/19  Yes Raylene Everts, MD  Artificial Tear Ointment (DRY EYES OP) Place 1-2 drops into both eyes daily as needed (for dry eyes).    Yes [provider]  atorvastatin (LIPITOR) 10 MG tablet Take 10 mg by mouth daily.   Yes [provider]  dicyclomine (BENTYL) 20 MG tablet Take 20 mg by mouth 3 (three) times daily. 10/17/19  Yes [provider]  fexofenadine (ALLEGRA) 180 MG tablet Take 1 tablet (180 mg total) by mouth daily. 09/07/19  Yes Shelda Pal, DO  gabapentin (NEURONTIN) 300 MG capsule Take 300 mg by mouth 3 (three) times daily.   Yes [provider]  metoprolol tartrate (LOPRESSOR) 25 MG tablet Take 25 mg by mouth 2 (two) times daily.   Yes [provider]  montelukast (SINGULAIR) 10 MG tablet Take 10 mg by mouth daily.    Yes [provider]  Multiple Vitamin (MULTIVITAMIN WITH MINERALS) TABS tablet Take 1 tablet by mouth daily.   Yes [provider]  ondansetron (ZOFRAN-ODT) 8 MG disintegrating tablet Take 1 tablet (8 mg total) by mouth every 8 (eight) hours as needed for nausea or vomiting. 08/28/20  Yes Jaynee Eagles, PA-C  aluminum-magnesium hydroxide-simethicone (MAALOX) 200-200-20 MG/5ML SUSP Take 30 mLs by mouth 4 (four) times daily -  before meals and at bedtime. Patient not taking: Reported on 07/08/2020 04/21/19   Augusto Gamble B, NP  amoxicillin-clavulanate (AUGMENTIN) 875-125 MG tablet Take 1 tablet by mouth 2 (two) times daily. One po bid x 7 days 09/10/20   Mesner, Corene Cornea, MD  azithromycin (ZITHROMAX) 250 MG tablet Take 1 tablet (250 mg total) by mouth daily. Take first 2 tablets together, then 1 every day until finished. Patient not taking: Reported on 09/17/2020 08/06/20   Vanessa Kick, MD  EPINEPHrine 0.3 mg/0.3 mL IJ SOAJ injection Inject 0.3 mg into the muscle once as needed for anaphylaxis.     [provider]  fluconazole (DIFLUCAN) 150 MG tablet Take one tablet by mouth as a single dose. May repeat in 3 days if symptoms persist. Patient not taking: Reported on 09/17/2020 08/06/20   Vanessa Kick, MD  ibuprofen (ADVIL) 600 MG tablet Take 1 tablet (600 mg total) by mouth every 8 (eight) hours as needed for headache. Patient not taking: Reported on 09/17/2020 07/13/19   Augusto Gamble B, NP  nystatin cream (MYCOSTATIN) Apply to affected area 2 times daily Patient not taking: Reported on 09/17/2020 07/13/19   Zigmund Gottron, NP  pantoprazole (PROTONIX) 40 MG tablet Take 40 mg by mouth 2 (two) times daily.  11/20/19   [provider]  predniSONE (DELTASONE) 20 MG tablet Take 2 tablets daily with breakfast. 08/28/20   Jaynee Eagles, PA-C  sitaGLIPtin-metformin (JANUMET) 50-500 MG tablet Take 1 tablet by mouth daily.    [provider]  sucralfate (CARAFATE) 1 g tablet Take 1 g by  mouth 4 (four) times daily -  with meals and at bedtime.  02/11/20   [provider]  tiZANidine (ZANAFLEX) 4 MG tablet Take 1 tablet (4 mg total) by mouth every 8 (eight) hours as needed. 08/28/20   Jaynee Eagles, PA-C  WIXELA INHUB 250-50 MCG/DOSE AEPB Inhale 1 puff into the lungs daily. 08/06/20   Vanessa Kick, MD  esomeprazole (NEXIUM) 40 MG capsule Take 1 capsule (40 mg total) by mouth daily. Patient not taking: Reported on 07/08/2020 04/24/19 08/06/20  Pfeiffer,  Jeannie Done, MD  fluticasone (FLONASE) 50 MCG/ACT nasal spray Place 2 sprays into both nostrils daily. Patient taking differently: Place 2 sprays into both nostrils daily as needed for allergies.  06/08/18 12/09/19  Raylene Everts, MD     Allergies    Otho Darner allergy], Erythromycin, Hm lidocaine patch [lidocaine], Fish allergy, Metoclopramide, Doxycycline, Levaquin [levofloxacin in d5w], Morphine, Morphine and related, and Sulfa antibiotics   Review of Systems   Review of Systems A comprehensive review of systems was completed and negative except as noted in HPI.    Physical Exam BP (!) 95/58   Pulse 70   Temp 99.1 F (37.3 C) (Oral)   Resp 16   SpO2 97%   Physical Exam Vitals and nursing note reviewed.  Constitutional:      Appearance: Normal appearance.  HENT:     Head: Normocephalic and atraumatic.     Nose: Nose normal.     Mouth/Throat:     Mouth: Mucous membranes are moist.  Eyes:     Extraocular Movements: Extraocular movements intact.     Conjunctiva/sclera: Conjunctivae normal.  Cardiovascular:     Rate and Rhythm: Normal rate.  Pulmonary:     Effort: Pulmonary effort is normal.     Breath sounds: Normal breath sounds.  Abdominal:     General: Abdomen is flat.     Palpations: Abdomen is soft.     Tenderness: There is no abdominal tenderness.  Musculoskeletal:        General: No swelling. Normal range of motion.     Cervical back: Neck supple.  Skin:    General: Skin is warm and dry.    Neurological:     General: No focal deficit present.     Mental Status: She is alert.  Psychiatric:        Mood and Affect: Mood normal.      ED Results / Procedures / Treatments   Labs (all labs ordered are listed, but only abnormal results are displayed) Labs Reviewed  COMPREHENSIVE METABOLIC PANEL - Abnormal; Notable for the following components:      Result Value   Sodium 134 (*)    Chloride 97 (*)    Glucose, Bld 102 (*)    Calcium 8.5 (*)    Total Protein 6.0 (*)    Albumin 3.4 (*)    All other components within normal limits  CBC WITH DIFFERENTIAL/PLATELET - Abnormal; Notable for the following components:   WBC 2.4 (*)    Platelets 136 (*)    All other components within normal limits  RESPIRATORY PANEL BY RT PCR (FLU A&B, COVID)  URINALYSIS, ROUTINE W REFLEX MICROSCOPIC    EKG None  Radiology No results found.  Procedures Procedures  Medications Ordered in the ED Medications  sodium chloride 0.9 % bolus 1,000 mL (1,000 mLs Intravenous New Bag/Given 09/17/20 1515)     MDM Rules/Calculators/A&P MDM Patient with symptoms concerning for Covid with recent household exposure. She has borderline low BP but no fever or hypoxia. Will check labs given weakness and decreased PO intake, give IVF for hypotension and reassess.  ED Course  I have reviewed the triage vital signs and the nursing notes.  Pertinent labs & imaging results that were available during my care of the patient were reviewed by me and considered in my medical decision making (see chart for details).  Clinical Course as of Sep 17 1610  Thu Sep 17, 2020  1603 CBC with low WBC, diff is pending.    [  CS]  1611 Care of the patient signed out to Dr. Francia Greaves at the change of shift.    [CS]    Clinical Course User Index [CS] Truddie Hidden, MD    Final Clinical Impression(s) / ED Diagnoses Final diagnoses:  None    Rx / DC Orders ED Discharge Orders    None       Truddie Hidden, MD 09/17/20 1611

## 2020-09-17 NOTE — ED Provider Notes (Signed)
Patient seen after prior ED provider.  Testing results discussed with the patient.  She is aware of her Covid positive status.  She appears to be appropriate for discharge.  She is not ready for discharge.  She does feel somewhat improved after her ED evaluation today.  She does understand the need for continued quarantine at home.  She does understand the need for close follow-up with her regular PCP.  Strict return precautions given and understood.      Valarie Merino, MD 09/17/20 [phone]

## 2020-09-17 NOTE — ED Triage Notes (Signed)
Pt had COVID antibody infusion on Tuesday. Pt c/o fever and weakness

## 2020-10-01 ENCOUNTER — Other Ambulatory Visit: Payer: Self-pay | Admitting: Family Medicine

## 2020-10-01 DIAGNOSIS — Z1231 Encounter for screening mammogram for malignant neoplasm of breast: Secondary | ICD-10-CM

## 2020-10-13 ENCOUNTER — Other Ambulatory Visit: Payer: Self-pay

## 2020-10-13 ENCOUNTER — Ambulatory Visit (HOSPITAL_COMMUNITY)
Admission: EM | Admit: 2020-10-13 | Discharge: 2020-10-13 | Disposition: A | Discharge status: Home / Self Care | Payer: Medicare HMO | Attending: Family Medicine | Admitting: Family Medicine

## 2020-10-13 ENCOUNTER — Encounter (HOSPITAL_COMMUNITY): Payer: Self-pay | Admitting: Emergency Medicine

## 2020-10-13 DIAGNOSIS — Z9109 Other allergy status, other than to drugs and biological substances: Secondary | ICD-10-CM | POA: Diagnosis not present

## 2020-10-13 MED ORDER — OLOPATADINE HCL 0.2 % OP SOLN: 1.0000 [drp] | Freq: Every day | OPHTHALMIC | 0 refills | Status: DC

## 2020-10-13 MED ORDER — PREDNISONE 20 MG PO TABS: 20.0000 mg | ORAL_TABLET | Freq: Two times a day (BID) | ORAL | 0 refills | Status: DC

## 2020-10-13 NOTE — ED Triage Notes (Signed)
Pt presents with nasal congestion and dry cough. States feels like mucus is stuck in throat. States tested positive for COVID on 09/17/20

## 2020-10-13 NOTE — Discharge Instructions (Addendum)
Drink plenty of water Take prednisone 2 times a day Use eyedrops once a day Continue using Mucinex Expect improvement over next few days See your primary care doctor as scheduled next week

## 2020-10-13 NOTE — ED Provider Notes (Signed)
[place]    CSN: [id number] Arrival date & time: 10/13/20  1745      History   Chief Complaint Chief Complaint  Patient presents with  . Nasal Congestion  . Cough    HPI Pamela Roberson is a 64 y.o. female.   HPI Patient had positive Covid 09/17/2020.  Abnormal lab test.  Has not yet followed up with her PCP.  She states that she did not have any shortness of breath cough or lung symptoms.  She states that she got better over time.  Now for the last few days she has had sinus drainage, postnasal drip, itchy watery eyes, and some sneezing.  Dry cough.  She states she feels like there is thick mucus in the back of her throat and in her sinuses.  She is trying to drink water.  She is retired and not working.  No fever or chills.  No body aches or headache.  No shortness of breath.  Patient did have antibiotic infusion. Past Medical History:  Diagnosis Date  . Arthritis   . Asthma   . Carotid stenosis, bilateral   . Chronic low back pain   . COPD (chronic obstructive pulmonary disease) ([place])   . Diabetes mellitus without complication ([place])    type II   . Diabetic neuropathy ([place])   . Diverticulitis   . Dyspnea    mild   . Frequent falls   . GERD (gastroesophageal reflux disease)   . History of bronchitis   . Hypercholesterolemia   . Hypertension   . IBS (irritable bowel syndrome)   . Osteoarthritis    knee  . Palpitations   . Sleep apnea    mild but no sleep apnea   . TMJ (dislocation of temporomandibular joint)   . 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 Vitamin D deficiency     Patient Active Problem List   Diagnosis Date Noted  . Dizzy 12/11/2017  . Pharyngeal dysphagia 12/11/2017  . Post-nasal drip 12/11/2017  . Referred ear pain, bilateral 12/11/2017  . Diabetes mellitus without complication ([place]) 99/24/2683  . Family history of glaucoma 06/29/2017  . Hyperopia of both eyes with astigmatism and presbyopia 06/29/2017  .  Keratoconjunctivitis sicca of both eyes not specified as Sjogren's 06/29/2017  . Nuclear sclerotic cataract of both eyes 06/29/2017  . Vitreous floater, bilateral 06/29/2017  . Biceps tendinitis of right shoulder 04/05/2017  . Impingement syndrome of right shoulder 04/05/2017  . Arthritis of right acromioclavicular joint 04/05/2017  . Partial nontraumatic tear of rotator cuff, right 04/05/2017    Past Surgical History:  Procedure Laterality Date  . ABDOMINAL HYSTERECTOMY  1993  . CARPAL TUNNEL RELEASE Right   . CERVICAL DISC SURGERY  04/29/2015   Dr Tammi Klippel, [place], [place]  . ESOPHAGOGASTRODUODENOSCOPY ENDOSCOPY     with esophagus being stretched twice per pt,   . EYE SURGERY    . 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 feeding tube placement and removal   2015  . NASAL SEPTUM SURGERY    . spurs removed from esophagus   2015  . TONSILLECTOMY      OB History   No obstetric history on file.      Home Medications    Prior to Admission medications   Medication Sig Start Date End Date Taking? Authorizing Provider  acyclovir (ZOVIRAX) 400 MG tablet Take 400 mg by mouth 2 (two) times daily.  [provider]  albuterol (PROAIR HFA) 108 (90 Base) MCG/ACT inhaler Inhale 1-2 puffs into the lungs every 6 (six) hours as needed for wheezing or shortness of breath. 06/14/20   Zigmund Gottron, NP  albuterol (PROVENTIL) (2.5 MG/3ML) 0.083% nebulizer solution Take 3 mLs (2.5 mg total) by nebulization every 4 (four) hours as needed for wheezing or shortness of breath. 03/11/19   Raylene Everts, MD  amoxicillin-clavulanate (AUGMENTIN) 875-125 MG tablet Take 1 tablet by mouth 2 (two) times daily. One po bid x 7 days 09/10/20   Mesner, Corene Cornea, MD  Artificial Tear Ointment (DRY EYES OP) Place 1-2 drops into both eyes daily as needed (for dry eyes).     [provider]  Ascorbic Acid (VITAMIN C) 1000 MG tablet Take 1,000 mg by mouth daily.    [provider]    atorvastatin (LIPITOR) 10 MG tablet Take 10 mg by mouth daily.    [provider]  cholecalciferol (VITAMIN D3) 25 MCG (1000 UNIT) tablet Take 2,000 Units by mouth daily.     [provider]  dicyclomine (BENTYL) 20 MG tablet Take 20 mg by mouth 3 (three) times daily. 10/17/19   [provider]  EPINEPHrine 0.3 mg/0.3 mL IJ SOAJ injection Inject 0.3 mg into the muscle once as needed for anaphylaxis.     [provider]  fexofenadine (ALLEGRA) 180 MG tablet Take 1 tablet (180 mg total) by mouth daily. 09/07/19   Shelda Pal, DO  fluticasone (FLONASE) 50 MCG/ACT nasal spray Place 1 spray into both nostrils daily as needed for allergies or rhinitis.    [provider]  gabapentin (NEURONTIN) 300 MG capsule Take 300 mg by mouth 3 (three) times daily.    [provider]  metoprolol tartrate (LOPRESSOR) 25 MG tablet Take 25 mg by mouth 2 (two) times daily.    [provider]  montelukast (SINGULAIR) 10 MG tablet Take 10 mg by mouth daily.     [provider]  Multiple Vitamin (MULTIVITAMIN WITH MINERALS) TABS tablet Take 1 tablet by mouth daily.    [provider]  nystatin cream (MYCOSTATIN) Apply to affected area 2 times daily Patient not taking: Reported on 09/17/2020 07/13/19   Augusto Gamble B, NP  Olopatadine HCl 0.2 % SOLN Apply 1 drop to eye at bedtime. 10/13/20   Raylene Everts, MD  ondansetron (ZOFRAN-ODT) 8 MG disintegrating tablet Take 1 tablet (8 mg total) by mouth every 8 (eight) hours as needed for nausea or vomiting. 08/28/20   Jaynee Eagles, PA-C  pantoprazole (PROTONIX) 40 MG tablet Take 40 mg by mouth 2 (two) times daily.  11/20/19   [provider]  predniSONE (DELTASONE) 20 MG tablet Take 1 tablet (20 mg total) by mouth 2 (two) times daily with a meal. 10/13/20   Raylene Everts, MD  sitaGLIPtin-metformin (JANUMET) 50-500 MG tablet Take 1 tablet by mouth daily.    [provider]  sucralfate (CARAFATE) 1 g tablet Take 1 g by mouth 4 (four) times daily.  02/11/20   [provider]  tiZANidine (ZANAFLEX) 4 MG tablet Take 1 tablet (4 mg total) by mouth every 8 (eight) hours as needed. Patient taking differently: Take 4 mg by mouth every 8 (eight) hours as needed for muscle spasms.  08/28/20   Jaynee Eagles, PA-C  zinc gluconate 50 MG tablet Take 50 mg by mouth daily.    [provider]  esomeprazole (NEXIUM) 40 MG capsule Take 1  capsule (40 mg total) by mouth daily. Patient not taking: Reported on 07/08/2020 04/24/19 08/06/20  Charlesetta Shanks, MD  Grant Ruts INHUB 250-50 MCG/DOSE AEPB Inhale 1 puff into the lungs daily. 08/06/20 10/13/20  Vanessa Kick, MD    Family History Family History  Problem Relation Age of Onset  . Diabetes Father        type 2  . Heart disease Father   . Hypertension Father   . Heart disease Brother   . Hypertension Brother     Social History Social History   Tobacco Use  . Smoking status: Former Smoker    Packs/day: 0.50    Years: 30.00    Pack years: 15.00    Quit date: 12/08/1994    Years since quitting: 25.8  . Smokeless tobacco: Never Used  Vaping Use  . Vaping Use: Never used  Substance Use Topics  . Alcohol use: No  . Drug use: No     Allergies   Crab [shellfish allergy], Erythromycin, Hm lidocaine patch [lidocaine], Fish allergy, Metoclopramide, Doxycycline, Levaquin [levofloxacin in d5w], Morphine, Morphine and related, and Sulfa antibiotics   Review of Systems Review of Systems See HPI  Physical Exam Triage Vital Signs ED Triage Vitals  Enc Vitals Group     BP 10/13/20 1813 139/76     Pulse Rate 10/13/20 1813 88     Resp 10/13/20 1813 17     Temp 10/13/20 1813 98.1 F (36.7 C)     Temp Source 10/13/20 1813 Oral     SpO2 10/13/20 1813 98 %     Weight --      Height --      Head Circumference --      Peak Flow --      Pain Score 10/13/20 1811 0     Pain Loc --      Pain Edu? --       Excl. in [place]? --    No data found.  Updated Vital Signs BP 139/76 (BP Location: Right Arm)   Pulse 88   Temp 98.1 F (36.7 C) (Oral)   Resp 17   SpO2 98%       Physical Exam Constitutional:      General: She is not in acute distress.    Appearance: She is well-developed.     Comments: Overweight.  No acute distress.  Normal gait  HENT:     Head: Normocephalic and atraumatic.     Right Ear: Tympanic membrane, ear canal and external ear normal.     Left Ear: Ear canal and external ear normal.     Nose: Congestion and rhinorrhea present.     Comments: Clear rhinorrhea    Mouth/Throat:     Comments: Throat clear.  Edentulous Eyes:     Conjunctiva/sclera: Conjunctivae normal.     Pupils: Pupils are equal, round, and reactive to light.     Comments: Mild conjunctival injection  Cardiovascular:     Rate and Rhythm: Normal rate.     Heart sounds: Normal heart sounds.  Pulmonary:     Effort: Pulmonary effort is normal. No respiratory distress.     Breath sounds: Normal breath sounds.     Comments: Heart and lung examination is normal Abdominal:     General: Bowel sounds are normal.     Palpations: Abdomen is soft.  Musculoskeletal:        General: Normal range of motion.     Cervical back: Normal range of motion.  Lymphadenopathy:     Cervical: No cervical adenopathy.  Skin:    General: Skin is warm and dry.  Neurological:     Mental Status: She is alert.  Psychiatric:        Behavior: Behavior normal.      UC Treatments / Results  Labs (all labs ordered are listed, but only abnormal results are displayed) Labs Reviewed - No data to display  EKG   Radiology No results found.  Procedures Procedures (including critical care time)  Medications Ordered in UC Medications - No data to display  Initial Impression / Assessment and Plan / UC Course  I have reviewed the triage vital signs and the nursing notes.  Pertinent labs & imaging results that were  available during my care of the patient were reviewed by me and considered in my medical decision making (see chart for details).     I feel patient has environmental allergies.  No evidence of persistent Covid infection. Final Clinical Impressions(s) / UC Diagnoses   Final diagnoses:  Environmental allergies     Discharge Instructions     Drink plenty of water Take prednisone 2 times a day Use eyedrops once a day Continue using Mucinex Expect improvement over next few days See your primary care doctor as scheduled next week   ED Prescriptions    Medication Sig Dispense Auth. Provider   predniSONE (DELTASONE) 20 MG tablet Take 1 tablet (20 mg total) by mouth 2 (two) times daily with a meal. 10 tablet Raylene Everts, MD   Olopatadine HCl 0.2 % SOLN Apply 1 drop to eye at bedtime. 2.5 mL Raylene Everts, MD     PDMP not reviewed this encounter.   Raylene Everts, MD 10/13/20 2123

## 2020-10-23 ENCOUNTER — Encounter (HOSPITAL_COMMUNITY): Payer: Self-pay

## 2020-10-23 ENCOUNTER — Other Ambulatory Visit: Payer: Self-pay

## 2020-10-23 ENCOUNTER — Ambulatory Visit (HOSPITAL_COMMUNITY)
Admission: EM | Admit: 2020-10-23 | Discharge: 2020-10-23 | Disposition: A | Discharge status: Home / Self Care | Payer: Medicare HMO | Attending: Emergency Medicine | Admitting: Emergency Medicine

## 2020-10-23 DIAGNOSIS — J019 Acute sinusitis, unspecified: Secondary | ICD-10-CM

## 2020-10-23 MED ORDER — AMOXICILLIN-POT CLAVULANATE 875-125 MG PO TABS: 1.0000 | ORAL_TABLET | Freq: Two times a day (BID) | ORAL | 0 refills | Status: AC

## 2020-10-23 NOTE — ED Triage Notes (Signed)
Pt in with c/o ear pain,headache, and facial pressure that started 1 week ago. States that her ears are popping and it feels like she hears water.   Pt has been taking benadryl because she says she has been itching all over her body   Was seen on 11/9 but says sxs have not gone away

## 2020-10-23 NOTE — Discharge Instructions (Signed)
Push fluids to ensure adequate hydration and keep secretions thin.  Complete course of antibiotics.  Continue with your allergy medications- allegra, eye drops and nasal spray.  Use of artificial tear ointment for your eyes to promote moisture.  Follow up with ophthalmology for any persistent eye symptoms.

## 2020-10-23 NOTE — ED Provider Notes (Signed)
[place]    CSN: [id number] Arrival date & time: 10/23/20  0800      History   Chief Complaint Chief Complaint  Patient presents with  . Otalgia  . Headache  . sinus pressure    HPI Pamela Roberson is a 64 y.o. female.   Pamela Roberson presents with complaints of 2 weeks of headache, ear pressure, nasal drainage. Some sore throat initially which has improved. Lymphadenopathy. Right eye burning. Was itching/tearing and now it is burning sensation. No cough or shortness of breath . No fevers. Uses allergy medications. Seen here 11/9 and recommended to take antihistamines as well as given prednisone. These have not helped. Facial pressure.     ROS per HPI, negative if not otherwise mentioned.      Past Medical History:  Diagnosis Date  . Arthritis   . Asthma   . Carotid stenosis, bilateral   . Chronic low back pain   . COPD (chronic obstructive pulmonary disease) ([place])   . Diabetes mellitus without complication ([place])    type II   . Diabetic neuropathy ([place])   . Diverticulitis   . Dyspnea    mild   . Frequent falls   . GERD (gastroesophageal reflux disease)   . History of bronchitis   . Hypercholesterolemia   . Hypertension   . IBS (irritable bowel syndrome)   . Osteoarthritis    knee  . Palpitations   . Sleep apnea    mild but no sleep apnea   . TMJ (dislocation of temporomandibular joint)   . 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 Vitamin D deficiency     Patient Active Problem List   Diagnosis Date Noted  . Dizzy 12/11/2017  . Pharyngeal dysphagia 12/11/2017  . Post-nasal drip 12/11/2017  . Referred ear pain, bilateral 12/11/2017  . Diabetes mellitus without complication ([place]) 07/37/1062  . Family history of glaucoma 06/29/2017  . Hyperopia of both eyes with astigmatism and presbyopia 06/29/2017  . Keratoconjunctivitis sicca of both eyes not specified as Sjogren's 06/29/2017  . Nuclear sclerotic cataract of both eyes  06/29/2017  . Vitreous floater, bilateral 06/29/2017  . Biceps tendinitis of right shoulder 04/05/2017  . Impingement syndrome of right shoulder 04/05/2017  . Arthritis of right acromioclavicular joint 04/05/2017  . Partial nontraumatic tear of rotator cuff, right 04/05/2017    Past Surgical History:  Procedure Laterality Date  . ABDOMINAL HYSTERECTOMY  1993  . CARPAL TUNNEL RELEASE Right   . CERVICAL DISC SURGERY  04/29/2015   Dr Tammi Klippel, [place], [place]  . ESOPHAGOGASTRODUODENOSCOPY ENDOSCOPY     with esophagus being stretched twice per pt,   . EYE SURGERY    . 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 feeding tube placement and removal   2015  . NASAL SEPTUM SURGERY    . spurs removed from esophagus   2015  . TONSILLECTOMY      OB History   No obstetric history on file.      Home Medications    Prior to Admission medications   Medication Sig Start Date End Date Taking? Authorizing Provider  acyclovir (ZOVIRAX) 400 MG tablet Take 400 mg by mouth 2 (two) times daily.    [provider]  albuterol (PROAIR HFA) 108 (90 Base) MCG/ACT inhaler Inhale 1-2 puffs into the lungs every 6 (six) hours as needed for wheezing or shortness of breath. 06/14/20   Zigmund Gottron, NP  albuterol (  PROVENTIL) (2.5 MG/3ML) 0.083% nebulizer solution Take 3 mLs (2.5 mg total) by nebulization every 4 (four) hours as needed for wheezing or shortness of breath. 03/11/19   Raylene Everts, MD  amoxicillin-clavulanate (AUGMENTIN) 875-125 MG tablet Take 1 tablet by mouth every 12 (twelve) hours for 10 days. 10/23/20 11/02/20  Zigmund Gottron, NP  Artificial Tear Ointment (DRY EYES OP) Place 1-2 drops into both eyes daily as needed (for dry eyes).     [provider]  Ascorbic Acid (VITAMIN C) 1000 MG tablet Take 1,000 mg by mouth daily.    [provider]  atorvastatin (LIPITOR) 10 MG tablet Take 10 mg by mouth daily.    [provider]  cholecalciferol (VITAMIN D3) 25  MCG (1000 UNIT) tablet Take 2,000 Units by mouth daily.     [provider]  dicyclomine (BENTYL) 20 MG tablet Take 20 mg by mouth 3 (three) times daily. 10/17/19   [provider]  EPINEPHrine 0.3 mg/0.3 mL IJ SOAJ injection Inject 0.3 mg into the muscle once as needed for anaphylaxis.     [provider]  fexofenadine (ALLEGRA) 180 MG tablet Take 1 tablet (180 mg total) by mouth daily. 09/07/19   Shelda Pal, DO  fluticasone (FLONASE) 50 MCG/ACT nasal spray Place 1 spray into both nostrils daily as needed for allergies or rhinitis.    [provider]  gabapentin (NEURONTIN) 300 MG capsule Take 300 mg by mouth 3 (three) times daily.    [provider]  metoprolol tartrate (LOPRESSOR) 25 MG tablet Take 25 mg by mouth 2 (two) times daily.    [provider]  montelukast (SINGULAIR) 10 MG tablet Take 10 mg by mouth daily.     [provider]  Multiple Vitamin (MULTIVITAMIN WITH MINERALS) TABS tablet Take 1 tablet by mouth daily.    [provider]  nystatin cream (MYCOSTATIN) Apply to affected area 2 times daily Patient not taking: Reported on 09/17/2020 07/13/19   Augusto Gamble B, NP  Olopatadine HCl 0.2 % SOLN Apply 1 drop to eye at bedtime. 10/13/20   Raylene Everts, MD  ondansetron (ZOFRAN-ODT) 8 MG disintegrating tablet Take 1 tablet (8 mg total) by mouth every 8 (eight) hours as needed for nausea or vomiting. 08/28/20   Jaynee Eagles, PA-C  pantoprazole (PROTONIX) 40 MG tablet Take 40 mg by mouth 2 (two) times daily.  11/20/19   [provider]  predniSONE (DELTASONE) 20 MG tablet Take 1 tablet (20 mg total) by mouth 2 (two) times daily with a meal. 10/13/20   Raylene Everts, MD  sitaGLIPtin-metformin (JANUMET) 50-500 MG tablet Take 1 tablet by mouth daily.    [provider]  sucralfate (CARAFATE) 1 g tablet Take 1 g by mouth 4 (four) times daily.  02/11/20   [provider]  tiZANidine  (ZANAFLEX) 4 MG tablet Take 1 tablet (4 mg total) by mouth every 8 (eight) hours as needed. Patient taking differently: Take 4 mg by mouth every 8 (eight) hours as needed for muscle spasms.  08/28/20   Jaynee Eagles, PA-C  zinc gluconate 50 MG tablet Take 50 mg by mouth daily.    [provider]  esomeprazole (NEXIUM) 40 MG capsule Take 1 capsule (40 mg total) by mouth daily. Patient not taking: Reported on 07/08/2020 04/24/19 08/06/20  Charlesetta Shanks, MD  Grant Ruts INHUB 250-50 MCG/DOSE AEPB Inhale 1 puff into the lungs daily. 08/06/20 10/13/20  Vanessa Kick, MD    Family  History Family History  Problem Relation Age of Onset  . Diabetes Father        type 2  . Heart disease Father   . Hypertension Father   . Heart disease Brother   . Hypertension Brother     Social History Social History   Tobacco Use  . Smoking status: Former Smoker    Packs/day: 0.50    Years: 30.00    Pack years: 15.00    Quit date: 12/08/1994    Years since quitting: 25.8  . Smokeless tobacco: Never Used  Vaping Use  . Vaping Use: Never used  Substance Use Topics  . Alcohol use: No  . Drug use: No     Allergies   Crab [shellfish allergy], Erythromycin, Hm lidocaine patch [lidocaine], Fish allergy, Metoclopramide, Doxycycline, Levaquin [levofloxacin in d5w], Morphine, Morphine and related, and Sulfa antibiotics   Review of Systems Review of Systems   Physical Exam Triage Vital Signs ED Triage Vitals  Enc Vitals Group     BP 10/23/20 0820 140/83     Pulse Rate 10/23/20 0820 98     Resp 10/23/20 0820 20     Temp 10/23/20 0820 99.6 F (37.6 C)     Temp Source 10/23/20 0820 Oral     SpO2 10/23/20 0820 100 %     Weight --      Height --      Head Circumference --      Peak Flow --      Pain Score 10/23/20 0818 7     Pain Loc --      Pain Edu? --      Excl. in [place]? --    No data found.  Updated Vital Signs BP 140/83 (BP Location: Right Arm)   Pulse 98   Temp 99.6 F (37.6 C) (Oral)    Resp 20   SpO2 100%   Visual Acuity Right Eye Distance:   Left Eye Distance:   Bilateral Distance:    Right Eye Near:   Left Eye Near:    Bilateral Near:     Physical Exam Constitutional:      General: She is not in acute distress.    Appearance: She is well-developed.  HENT:     Head: Normocephalic.     Right Ear: Tympanic membrane and ear canal normal.     Left Ear: Tympanic membrane and ear canal normal.     Nose:     Right Sinus: Frontal sinus tenderness present.     Left Sinus: Frontal sinus tenderness present.     Mouth/Throat:     Mouth: Mucous membranes are moist.  Eyes:     General: Lids are normal.        Right eye: No foreign body or discharge.     Extraocular Movements: Extraocular movements intact.     Right eye: Normal extraocular motion.     Conjunctiva/sclera: Conjunctivae normal.     Pupils: Pupils are equal, round, and reactive to light. Pupils are equal.  Cardiovascular:     Rate and Rhythm: Normal rate.  Pulmonary:     Effort: Pulmonary effort is normal.  Skin:    General: Skin is warm and dry.  Neurological:     Mental Status: She is alert and oriented to person, place, and time.      UC Treatments / Results  Labs (all labs ordered are listed, but only abnormal results are displayed) Labs Reviewed - No data to display  EKG   Radiology No results found.  Procedures Procedures (including critical care time)  Medications Ordered in UC Medications - No data to display  Initial Impression / Assessment and Plan / UC Course  I have reviewed the triage vital signs and the nursing notes.  Pertinent labs & imaging results that were available during my care of the patient were reviewed by me and considered in my medical decision making (see chart for details).     2 weeks of sinusitis symptoms without improvement with supportive cares and prednisone. Antibiotics provided. Return precautions provided. Patient verbalized understanding and  agreeable to plan.   Final Clinical Impressions(s) / UC Diagnoses   Final diagnoses:  Acute sinusitis, recurrence not specified, unspecified location     Discharge Instructions     Push fluids to ensure adequate hydration and keep secretions thin.  Complete course of antibiotics.  Continue with your allergy medications- allegra, eye drops and nasal spray.  Use of artificial tear ointment for your eyes to promote moisture.  Follow up with ophthalmology for any persistent eye symptoms.     ED Prescriptions    Medication Sig Dispense Auth. Provider   amoxicillin-clavulanate (AUGMENTIN) 875-125 MG tablet Take 1 tablet by mouth every 12 (twelve) hours for 10 days. 20 tablet Zigmund Gottron, NP     PDMP not reviewed this encounter.   Zigmund Gottron, NP 10/23/20 1334

## 2020-11-07 ENCOUNTER — Other Ambulatory Visit (HOSPITAL_COMMUNITY)
Admission: RE | Admit: 2020-11-07 | Discharge: 2020-11-07 | Disposition: A | Discharge status: Home / Self Care | Payer: Medicare HMO | Source: Ambulatory Visit | Attending: Gastroenterology | Admitting: Gastroenterology

## 2020-11-07 DIAGNOSIS — Z20822 Contact with and (suspected) exposure to covid-19: Secondary | ICD-10-CM | POA: Insufficient documentation

## 2020-11-07 DIAGNOSIS — Z01812 Encounter for preprocedural laboratory examination: Secondary | ICD-10-CM | POA: Insufficient documentation

## 2020-11-08 LAB — SARS CORONAVIRUS 2 (TAT 6-24 HRS): SARS Coronavirus 2: NEGATIVE

## 2020-11-09 ENCOUNTER — Ambulatory Visit: Payer: Medicare Other

## 2020-11-11 ENCOUNTER — Encounter (HOSPITAL_COMMUNITY): Admission: RE | Payer: Self-pay | Source: Home / Self Care

## 2020-11-11 ENCOUNTER — Ambulatory Visit (HOSPITAL_COMMUNITY): Admission: RE | Admit: 2020-11-11 | Payer: Medicare HMO | Source: Home / Self Care | Admitting: Gastroenterology

## 2020-11-11 SURGERY — MANOMETRY, ESOPHAGUS

## 2020-11-11 NOTE — Progress Notes (Signed)
Spoke with patient states she would like to cancel her procedure.  Patient is concerned regarding her lidocaine allergy.  Explained procedure could be attempted without lidocaine.  She states she still wanted to cancel due to mucus in her throat and IBS symptoms flairing today.  Explained could accommodate her needs if she was willing to attempt.  Patient states she would "not want to try and wants to cancel". Patient wanted to know other options than manometry.  Encourage patient to contact Dr. Kathline Magic office to discuss options/reschedule.  Patient verbalized understanding.  Contacted Dr. Kathline Magic office and informed of cancellation.

## 2020-11-30 ENCOUNTER — Encounter: Payer: Self-pay | Admitting: Podiatry

## 2020-11-30 ENCOUNTER — Ambulatory Visit (INDEPENDENT_AMBULATORY_CARE_PROVIDER_SITE_OTHER): Payer: Medicare HMO

## 2020-11-30 ENCOUNTER — Ambulatory Visit (INDEPENDENT_AMBULATORY_CARE_PROVIDER_SITE_OTHER): Payer: Medicare HMO | Admitting: Podiatry

## 2020-11-30 ENCOUNTER — Other Ambulatory Visit: Payer: Self-pay

## 2020-11-30 DIAGNOSIS — B351 Tinea unguium: Secondary | ICD-10-CM | POA: Diagnosis not present

## 2020-11-30 DIAGNOSIS — M79671 Pain in right foot: Secondary | ICD-10-CM

## 2020-11-30 DIAGNOSIS — M79674 Pain in right toe(s): Secondary | ICD-10-CM

## 2020-11-30 DIAGNOSIS — S99922A Unspecified injury of left foot, initial encounter: Secondary | ICD-10-CM

## 2020-11-30 DIAGNOSIS — E1142 Type 2 diabetes mellitus with diabetic polyneuropathy: Secondary | ICD-10-CM | POA: Diagnosis not present

## 2020-11-30 DIAGNOSIS — E119 Type 2 diabetes mellitus without complications: Secondary | ICD-10-CM

## 2020-11-30 DIAGNOSIS — L84 Corns and callosities: Secondary | ICD-10-CM

## 2020-11-30 DIAGNOSIS — M79675 Pain in left toe(s): Secondary | ICD-10-CM

## 2020-11-30 NOTE — Patient Instructions (Signed)
Plantar Fasciitis  Plantar fasciitis is a painful foot condition that affects the heel. It occurs when the band of tissue that connects the toes to the heel bone (plantar fascia) becomes irritated. This can happen as the result of exercising too much or doing other repetitive activities (overuse injury). The pain from plantar fasciitis can range from mild irritation to severe pain that makes it difficult to walk or move. The pain is usually worse in the morning after sleeping, or after sitting or lying down for a while. Pain may also be worse after long periods of walking or standing. What are the causes? This condition may be caused by:  Standing for long periods of time.  Wearing shoes that do not have good arch support.  Doing activities that put stress on joints (high-impact activities), including running, aerobics, and ballet.  Being overweight.  An abnormal way of walking (gait).  Tight muscles in the back of your lower leg (calf).  High arches in your feet.  Starting a new athletic activity. What are the signs or symptoms? The main symptom of this condition is heel pain. Pain may:  Be worse with first steps after a time of rest, especially in the morning after sleeping or after you have been sitting or lying down for a while.  Be worse after long periods of standing still.  Decrease after 30-45 minutes of activity, such as gentle walking. How is this diagnosed? This condition may be diagnosed based on your medical history and your symptoms. Your health care provider may ask questions about your activity level. Your health care provider will do a physical exam to check for:  A tender area on the bottom of your foot.  A high arch in your foot.  Pain when you move your foot.  Difficulty moving your foot. You may have imaging tests to confirm the diagnosis, such as:  X-rays.  Ultrasound.  MRI. How is this treated? Treatment for plantar fasciitis depends on how  severe your condition is. Treatment may include:  Rest, ice, applying pressure (compression), and raising the affected foot (elevation). This may be called RICE therapy. Your health care provider may recommend RICE therapy along with over-the-counter pain medicines to manage your pain.  Exercises to stretch your calves and your plantar fascia.  A splint that holds your foot in a stretched, upward position while you sleep (night splint).  Physical therapy to relieve symptoms and prevent problems in the future.  Injections of steroid medicine (cortisone) to relieve pain and inflammation.  Stimulating your plantar fascia with electrical impulses (extracorporeal shock wave therapy). This is usually the last treatment option before surgery.  Surgery, if other treatments have not worked after 12 months. Follow these instructions at home:  Managing pain, stiffness, and swelling  If directed, put ice on the painful area: ? Put ice in a plastic bag, or use a frozen bottle of water. ? Place a towel between your skin and the bag or bottle. ? Roll the bottom of your foot over the bag or bottle. ? Do this for 20 minutes, 2-3 times a day.  Wear athletic shoes that have air-sole or gel-sole cushions, or try wearing soft shoe inserts that are designed for plantar fasciitis.  Raise (elevate) your foot above the level of your heart while you are sitting or lying down. Activity  Avoid activities that cause pain. Ask your health care provider what activities are safe for you.  Do physical therapy exercises and stretches as told   by your health care provider.  Try activities and forms of exercise that are easier on your joints (low-impact). Examples include swimming, water aerobics, and biking. General instructions  Take over-the-counter and prescription medicines only as told by your health care provider.  Wear a night splint while sleeping, if told by your health care provider. Loosen the splint  if your toes tingle, become numb, or turn cold and blue.  Maintain a healthy weight, or work with your health care provider to lose weight as needed.  Keep all follow-up visits as told by your health care provider. This is important. Contact a health care provider if you:  Have symptoms that do not go away after caring for yourself at home.  Have pain that gets worse.  Have pain that affects your ability to move or do your daily activities. Summary  Plantar fasciitis is a painful foot condition that affects the heel. It occurs when the band of tissue that connects the toes to the heel bone (plantar fascia) becomes irritated.  The main symptom of this condition is heel pain that may be worse after exercising too much or standing still for a long time.  Treatment varies, but it usually starts with rest, ice, compression, and elevation (RICE therapy) and over-the-counter medicines to manage pain. This information is not intended to replace advice given to you by your health care provider. Make sure you discuss any questions you have with your health care provider. Document Revised: 11/03/2017 Document Reviewed: 09/18/2017 Elsevier Patient Education  2020 [employer]  Exercises for the foot include:  Stretches to help lengthen the lower leg and plantar fascia areas Theraband exercises for the lower leg and ankle to help strengthen the surrounding area- dorsiflexion, plantarflexion, inversion, eversion Massage rolling on the plantar surface of the foot with a frozen bottle, tennis ball or golf ball Towel or marble pick-ups to strengthen the plantar surface of the foot Weight bearing exercises to increase balance and overall stability   

## 2020-12-04 ENCOUNTER — Other Ambulatory Visit: Payer: Self-pay

## 2020-12-04 ENCOUNTER — Ambulatory Visit (HOSPITAL_COMMUNITY)
Admission: EM | Admit: 2020-12-04 | Discharge: 2020-12-04 | Disposition: A | Discharge status: Home / Self Care | Payer: Medicare HMO | Attending: Internal Medicine | Admitting: Internal Medicine

## 2020-12-04 ENCOUNTER — Encounter (HOSPITAL_COMMUNITY): Payer: Self-pay | Admitting: Emergency Medicine

## 2020-12-04 DIAGNOSIS — D126 Benign neoplasm of colon, unspecified: Secondary | ICD-10-CM | POA: Insufficient documentation

## 2020-12-04 DIAGNOSIS — K5904 Chronic idiopathic constipation: Secondary | ICD-10-CM | POA: Insufficient documentation

## 2020-12-04 DIAGNOSIS — R1310 Dysphagia, unspecified: Secondary | ICD-10-CM | POA: Insufficient documentation

## 2020-12-04 DIAGNOSIS — K5900 Constipation, unspecified: Secondary | ICD-10-CM | POA: Insufficient documentation

## 2020-12-04 DIAGNOSIS — K602 Anal fissure, unspecified: Secondary | ICD-10-CM | POA: Insufficient documentation

## 2020-12-04 DIAGNOSIS — K219 Gastro-esophageal reflux disease without esophagitis: Secondary | ICD-10-CM | POA: Insufficient documentation

## 2020-12-04 DIAGNOSIS — M5432 Sciatica, left side: Secondary | ICD-10-CM

## 2020-12-04 DIAGNOSIS — K6289 Other specified diseases of anus and rectum: Secondary | ICD-10-CM | POA: Insufficient documentation

## 2020-12-04 DIAGNOSIS — R1011 Right upper quadrant pain: Secondary | ICD-10-CM | POA: Insufficient documentation

## 2020-12-04 MED ORDER — KETOROLAC TROMETHAMINE 30 MG/ML IJ SOLN
INTRAMUSCULAR | Status: AC
  Filled 2020-12-04: qty 1

## 2020-12-04 MED ORDER — IBUPROFEN 600 MG PO TABS: 600.0000 mg | ORAL_TABLET | Freq: Four times a day (QID) | ORAL | 0 refills | Status: DC | PRN

## 2020-12-04 MED ORDER — KETOROLAC TROMETHAMINE 30 MG/ML IJ SOLN
30.0000 mg | Freq: Once | INTRAMUSCULAR | Status: AC
  Administered 2020-12-04: 30 mg via INTRAMUSCULAR

## 2020-12-04 MED ORDER — CYCLOBENZAPRINE HCL 5 MG PO TABS: 5.0000 mg | ORAL_TABLET | Freq: Every evening | ORAL | 0 refills | Status: DC | PRN

## 2020-12-04 NOTE — Progress Notes (Signed)
ANNUAL DIABETIC FOOT EXAM  Subjective: Pamela Roberson presents today for for annual diabetic foot examination, at risk foot care with history of diabetic neuropathy and callus(es) bilaterally and painful thick toenails that are difficult to trim. Painful toenails interfere with ambulation. Aggravating factors include wearing enclosed shoe gear. Pain is relieved with periodic professional debridement. Painful calluses are aggravated when weightbearing with and without shoegear. Pain is relieved with periodic professional debridement.  Patient relates 10 year h/o diabetes.  Patient denies any h/o foot wounds.  Patient relates occasional symptoms of foot numbness.  Patient does have symptoms of foot tingling.  Patient does have symptoms of burning in feet.  She also relates sharp shooting pains which occur down her legs on occasion.  Patient relates back pain with tingling and burning.  States her left leg gives out on occasion she also states that she loses balance when walking on occasion.  Patient states blood sugar was 120 mg/dL yesterday.  Pamela Roberson is also presenting with pain in her right arch noted as shooting in nature.  She states she dropped a picture  on the top of her left foot about 3-4 months ago.  She has occasional pain of this area as well.  [facility] [place] is patient's PCP.  Past Medical History:  Diagnosis Date  . Arthritis   . Asthma   . Carotid stenosis, bilateral   . Chronic low back pain   . COPD (chronic obstructive pulmonary disease) ([place])   . Diabetes mellitus without complication ([place])    type II   . Diabetic neuropathy ([place])   . Diverticulitis   . Dyspnea    mild   . Frequent falls   . GERD (gastroesophageal reflux disease)   . History of bronchitis   . Hypercholesterolemia   . Hypertension   . IBS (irritable bowel syndrome)   . Osteoarthritis    knee  . Palpitations   . Sleep apnea    mild but no sleep apnea   . TMJ (dislocation  of temporomandibular joint)   . 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 Vitamin D deficiency    Patient Active Problem List   Diagnosis Date Noted  . Adenomatous polyp of colon 12/04/2020  . Anal fissure 12/04/2020  . Chronic idiopathic constipation 12/04/2020  . Constipation 12/04/2020  . Dysphagia 12/04/2020  . Gastroesophageal reflux disease 12/04/2020  . Rectal pain 12/04/2020  . Right upper quadrant pain 12/04/2020  . Dizzy 12/11/2017  . Pharyngeal dysphagia 12/11/2017  . Post-nasal drip 12/11/2017  . Referred ear pain, bilateral 12/11/2017  . Diabetes mellitus without complication ([place]) 63/84/6659  . Family history of glaucoma 06/29/2017  . Hyperopia of both eyes with astigmatism and presbyopia 06/29/2017  . Keratoconjunctivitis sicca of both eyes not specified as Sjogren's 06/29/2017  . Nuclear sclerotic cataract of both eyes 06/29/2017  . Vitreous floater, bilateral 06/29/2017  . Biceps tendinitis of right shoulder 04/05/2017  . Impingement syndrome of right shoulder 04/05/2017  . Arthritis of right acromioclavicular joint 04/05/2017  . Partial nontraumatic tear of rotator cuff, right 04/05/2017   Past Surgical History:  Procedure Laterality Date  . ABDOMINAL HYSTERECTOMY  1993  . CARPAL TUNNEL RELEASE Right   . CERVICAL DISC SURGERY  04/29/2015   Dr Tammi Klippel, [place] [place], [place]  . ESOPHAGOGASTRODUODENOSCOPY ENDOSCOPY     with esophagus being stretched twice per pt,   . EYE SURGERY    . 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  feeding tube placement and removal   2015  . NASAL SEPTUM SURGERY    . spurs removed from esophagus   2015  . TONSILLECTOMY     Current Outpatient Medications on File Prior to Visit  Medication Sig Dispense Refill  . acyclovir (ZOVIRAX) 400 MG tablet Take 400 mg by mouth 2 (two) times daily.    Marland Kitchen albuterol (PROAIR HFA) 108 (90 Base) MCG/ACT inhaler Inhale 1-2 puffs into the lungs every 6 (six) hours as needed for wheezing or shortness of  breath. 8 g 0  . albuterol (PROVENTIL) (2.5 MG/3ML) 0.083% nebulizer solution Take 3 mLs (2.5 mg total) by nebulization every 4 (four) hours as needed for wheezing or shortness of breath. 30 vial 0  . Artificial Tear Ointment (DRY EYES OP) Place 1-2 drops into both eyes daily as needed (for dry eyes).     . Ascorbic Acid (VITAMIN C) 1000 MG tablet Take 1,000 mg by mouth daily.    Marland Kitchen atorvastatin (LIPITOR) 10 MG tablet Take 10 mg by mouth daily.    . cholecalciferol (VITAMIN D3) 25 MCG (1000 UNIT) tablet Take 2,000 Units by mouth daily.     Marland Kitchen COVID-19 Specimen Collection KIT See admin instructions. for testing    . dicyclomine (BENTYL) 20 MG tablet Take 20 mg by mouth 3 (three) times daily.    Marland Kitchen EPINEPHrine 0.3 mg/0.3 mL IJ SOAJ injection Inject 0.3 mg into the muscle once as needed for anaphylaxis.     . fexofenadine (ALLEGRA) 180 MG tablet Take 1 tablet (180 mg total) by mouth daily. 30 tablet 1  . fluticasone (FLONASE) 50 MCG/ACT nasal spray Place 1 spray into both nostrils daily as needed for allergies or rhinitis.    Marland Kitchen gabapentin (NEURONTIN) 300 MG capsule Take 300 mg by mouth 3 (three) times daily.    Marland Kitchen HYDROcodone-Acetaminophen 10-325 MG/15ML SOLN Take by mouth.    . hydrocortisone 2.5 % cream Apply topically.    Marland Kitchen JANUMET XR 50-500 MG TB24 Take 1 tablet by mouth daily.    . metoprolol tartrate (LOPRESSOR) 25 MG tablet Take 25 mg by mouth 2 (two) times daily.    . montelukast (SINGULAIR) 10 MG tablet Take 10 mg by mouth daily.     . Multiple Vitamin (MULTIVITAMIN WITH MINERALS) TABS tablet Take 1 tablet by mouth daily.    Marland Kitchen nystatin cream (MYCOSTATIN) Apply to affected area 2 times daily (Patient not taking: Reported on 09/17/2020) 30 g 0  . Olopatadine HCl 0.2 % SOLN Apply 1 drop to eye at bedtime. 2.5 mL 0  . ondansetron (ZOFRAN-ODT) 8 MG disintegrating tablet Take 1 tablet (8 mg total) by mouth every 8 (eight) hours as needed for nausea or vomiting. 20 tablet 0  . pantoprazole (PROTONIX)  40 MG tablet Take 40 mg by mouth 2 (two) times daily.     . predniSONE (DELTASONE) 20 MG tablet Take 1 tablet (20 mg total) by mouth 2 (two) times daily with a meal. 10 tablet 0  . sucralfate (CARAFATE) 1 g tablet Take 1 g by mouth 4 (four) times daily.     Marland Kitchen tiZANidine (ZANAFLEX) 4 MG tablet Take 1 tablet (4 mg total) by mouth every 8 (eight) hours as needed. (Patient taking differently: Take 4 mg by mouth every 8 (eight) hours as needed for muscle spasms. ) 30 tablet 0  . zinc gluconate 50 MG tablet Take 50 mg by mouth daily.    . [DISCONTINUED] esomeprazole (NEXIUM) 40 MG capsule Take 1  capsule (40 mg total) by mouth daily. (Patient not taking: Reported on 07/08/2020) 30 capsule 0  . [DISCONTINUED] WIXELA INHUB 250-50 MCG/DOSE AEPB Inhale 1 puff into the lungs daily. 14 each 0   No current facility-administered medications on file prior to visit.    Allergies  Allergen Reactions  . Crab [Shellfish Allergy] Shortness Of Breath and Swelling  . Erythromycin Swelling and Rash  . Hm Lidocaine Patch [Lidocaine]     Burning on skin  . Erdafitinib     Other reaction(s): pt not sure  . Fish Allergy     Swelling   . Metoclopramide Other (See Comments)    Slurred speech and unable to move muscles,  Caused her to drag her feet  . Sulfacetamide     Other reaction(s): itching, rash  . Doxycycline     GI Intolerance  . Levaquin [Levofloxacin In D5w] Other (See Comments)    Causes irregular heart beat  . Morphine Nausea And Vomiting and Rash    Irregular heart beat  . Morphine And Related Nausea And Vomiting and Rash    Irregular heart beat  . Sulfa Antibiotics Rash   Social History   Occupational History    Comment: NA  Tobacco Use  . Smoking status: Former Smoker    Packs/day: 0.50    Years: 30.00    Pack years: 15.00    Quit date: 12/08/1994    Years since quitting: 26.0  . Smokeless tobacco: Never Used  Vaping Use  . Vaping Use: Never used  Substance and Sexual Activity  . Alcohol  use: No  . Drug use: No  . Sexual activity: Not on file   Family History  Problem Relation Age of Onset  . Diabetes Father        type 2  . Heart disease Father   . Hypertension Father   . Heart disease Brother   . Hypertension Brother    Immunization History  Administered Date(s) Administered  . Tdap 05/30/2019     Review of Systems: Negative except as noted in the HPI.  Objective: There were no vitals filed for this visit.  Pamela Roberson is a pleasant 64 y.o. female in NAD. AAO X 3.  Vascular Examination: Capillary refill time to digits immediate b/l. Palpable DP pulse(s) b/l lower extremities Faintly palpable PT pulse(s) b/l lower extremities. Pedal hair absent. Lower extremity skin temperature gradient within normal limits.  Dermatological Examination: Pedal skin with normal turgor, texture and tone bilaterally. No open wounds bilaterally. No interdigital macerations bilaterally. Toenails 1-5 b/l elongated, discolored, dystrophic, thickened, crumbly with subungual debris and tenderness to dorsal palpation. Hyperkeratotic lesion(s) submet head 5 left foot, submet head 5 right foot and sub 5th met base right foot.  No erythema, no edema, no drainage, no fluctuance.  Musculoskeletal Examination: Normal muscle strength 5/5 to all lower extremity muscle groups bilaterally. No pain crepitus or joint limitation noted with ROM b/l. No gross bony deformities bilaterally. Pain on palpation medial tubercle right heel. Pain along medial longitudinal arch right foot.  Footwear Assessment: Does the patient wear appropriate shoes? Yes. Does the patient need inserts/orthotics? Yes  Neurological Examination: Pt has subjective symptoms of neuropathy. Protective sensation intact 5/5 intact bilaterally with 10g monofilament b/l. Vibratory sensation intact b/l. Proprioception intact bilaterally.   Xray findings 3 views, bilateral feet: No gas in tissues b/l lower extremities. Posterior  calcaneal spur noted left foot and right foot. No evidence of fracture left foot and right foot. No foreign  body evident left foot and right foot.   Assessment: 1. Pain due to onychomycosis of toenails of both feet   2. Injury of left foot, initial encounter   3. Arch pain of right foot   4. Callus   5. Diabetic peripheral neuropathy associated with type 2 diabetes mellitus ([place])   6. Encounter for diabetic foot exam ([place])     ADA Risk Categorization: Low Risk :  Patient has all of the following: Intact protective sensation No prior foot ulcer  No severe deformity Pedal pulses present  Plan: -Examined patient. -Diabetic foot examination performed on today's visit. -Patient to continue soft, supportive shoe gear daily. -Toenails 1-5 b/l were debrided in length and girth with sterile nail nippers and dremel without iatrogenic bleeding.  -Callus(es) submet head 5 left foot, submet head 5 right foot and sub 5th met base right  foot pared utilizing sterile scalpel blade without complication or incident. Total number debrided =3. -Patient to report any pedal injuries to medical professional immediately. -Xray of both feet foot was performed and reviewed with patient and/or POA.  She does have symptoms of plantar fasciitis of the right foot.  I applied a felt arch pad in her insole and we will see if this will provide her some relief.  Steroid injection was discussed but she does relate allergy to to lidocaine so this was not an option for [place]. -We did discuss her neuropathy symptoms.  She has multiple issues and I believe they are related to her back problems.  I advised her to revisit neurology for further work-up.due to loss of balance and her leg giving out on occasion.  She related understanding. -Patient/POA to call should there be question/concern in the interim.  Return in about 3 months (around 02/28/2021).  Marzetta Board, DPM

## 2020-12-04 NOTE — ED Triage Notes (Signed)
PT C/O: LLE anterior pain onset 3 weeks.... Hx of sciatic pain on LLE  Also having back pain.   Pain increases when she lays down   DENIES: inj/trauma  TAKING MEDS: none  A&O x4... NAD... Ambulatory

## 2020-12-04 NOTE — Discharge Instructions (Signed)
Take the medication Gentle range range of motion Heating pad use If pain worsens please return to the urgent care to be re-evaluated

## 2020-12-06 NOTE — ED Provider Notes (Signed)
Person    CSN: [id number] Arrival date & time: 12/04/20  1605      History   Chief Complaint Chief Complaint  Patient presents with  . Leg Pain    HPI Pamela Roberson is a 65 y.o. female comes to urgent care with complaints of back pain radiating to the left leg.  Patient has a history of chronic back pain secondary to degenerative disc disease.  She has been offered steroid injections to the back in the past but she does not want to pursue that option.  Back pain started about 3 weeks ago.  Pain is currently 8 out of 10.  Radiates to the left thigh.  Increases or worsens when she lays down.  No known relieving.  She denies any weakness or numbness in the leg.   HPI  Past Medical History:  Diagnosis Date  . Arthritis   . Asthma   . Carotid stenosis, bilateral   . Chronic low back pain   . COPD (chronic obstructive pulmonary disease) ([place])   . Diabetes mellitus without complication ([place])    type II   . Diabetic neuropathy ([place])   . Diverticulitis   . Dyspnea    mild   . Frequent falls   . GERD (gastroesophageal reflux disease)   . History of bronchitis   . Hypercholesterolemia   . Hypertension   . IBS (irritable bowel syndrome)   . Osteoarthritis    knee  . Palpitations   . Sleep apnea    mild but no sleep apnea   . TMJ (dislocation of temporomandibular joint)   . 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 Vitamin D deficiency     Patient Active Problem List   Diagnosis Date Noted  . Adenomatous polyp of colon 12/04/2020  . Anal fissure 12/04/2020  . Chronic idiopathic constipation 12/04/2020  . Constipation 12/04/2020  . Dysphagia 12/04/2020  . Gastroesophageal reflux disease 12/04/2020  . Rectal pain 12/04/2020  . Right upper quadrant pain 12/04/2020  . Dizzy 12/11/2017  . Pharyngeal dysphagia 12/11/2017  . Post-nasal drip 12/11/2017  . Referred ear pain, bilateral 12/11/2017  . Diabetes mellitus without complication ([place])  28/36/6294  . Family history of glaucoma 06/29/2017  . Hyperopia of both eyes with astigmatism and presbyopia 06/29/2017  . Keratoconjunctivitis sicca of both eyes not specified as Sjogren's 06/29/2017  . Nuclear sclerotic cataract of both eyes 06/29/2017  . Vitreous floater, bilateral 06/29/2017  . Biceps tendinitis of right shoulder 04/05/2017  . Impingement syndrome of right shoulder 04/05/2017  . Arthritis of right acromioclavicular joint 04/05/2017  . Partial nontraumatic tear of rotator cuff, right 04/05/2017    Past Surgical History:  Procedure Laterality Date  . ABDOMINAL HYSTERECTOMY  1993  . CARPAL TUNNEL RELEASE Right   . CERVICAL DISC SURGERY  04/29/2015   Dr Tammi Klippel, [place], [place]  . ESOPHAGOGASTRODUODENOSCOPY ENDOSCOPY     with esophagus being stretched twice per pt,   . EYE SURGERY    . 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 feeding tube placement and removal   2015  . NASAL SEPTUM SURGERY    . spurs removed from esophagus   2015  . TONSILLECTOMY      OB History   No obstetric history on file.      Home Medications    Prior to Admission medications   Medication Sig Start Date End Date Taking? Authorizing Provider  cyclobenzaprine (FLEXERIL) 5  MG tablet Take 1 tablet (5 mg total) by mouth at bedtime as needed for muscle spasms. 12/04/20  Yes Kierra Jezewski, Myrene Galas, MD  ibuprofen (ADVIL) 600 MG tablet Take 1 tablet (600 mg total) by mouth every 6 (six) hours as needed. 12/04/20  Yes [place], Myrene Galas, MD  acyclovir (ZOVIRAX) 400 MG tablet Take 400 mg by mouth 2 (two) times daily.    [provider]  albuterol (PROAIR HFA) 108 (90 Base) MCG/ACT inhaler Inhale 1-2 puffs into the lungs every 6 (six) hours as needed for wheezing or shortness of breath. 06/14/20   Zigmund Gottron, NP  albuterol (PROVENTIL) (2.5 MG/3ML) 0.083% nebulizer solution Take 3 mLs (2.5 mg total) by nebulization every 4 (four) hours as needed for wheezing or shortness of breath. 03/11/19    Raylene Everts, MD  Artificial Tear Ointment (DRY EYES OP) Place 1-2 drops into both eyes daily as needed (for dry eyes).     [provider]  Ascorbic Acid (VITAMIN C) 1000 MG tablet Take 1,000 mg by mouth daily.    [provider]  atorvastatin (LIPITOR) 10 MG tablet Take 10 mg by mouth daily.    [provider]  cholecalciferol (VITAMIN D3) 25 MCG (1000 UNIT) tablet Take 2,000 Units by mouth daily.     [provider]  COVID-19 Specimen Collection KIT See admin instructions. for testing 09/16/20   [provider]  dicyclomine (BENTYL) 20 MG tablet Take 20 mg by mouth 3 (three) times daily. 10/17/19   [provider]  EPINEPHrine 0.3 mg/0.3 mL IJ SOAJ injection Inject 0.3 mg into the muscle once as needed for anaphylaxis.     [provider]  fexofenadine (ALLEGRA) 180 MG tablet Take 1 tablet (180 mg total) by mouth daily. 09/07/19   Shelda Pal, DO  fluticasone (FLONASE) 50 MCG/ACT nasal spray Place 1 spray into both nostrils daily as needed for allergies or rhinitis.    [provider]  gabapentin (NEURONTIN) 300 MG capsule Take 300 mg by mouth 3 (three) times daily.    [provider]  HYDROcodone-Acetaminophen 10-325 MG/15ML SOLN Take by mouth.    [provider]  hydrocortisone 2.5 % cream Apply topically. 09/24/20   [provider]  JANUMET XR 50-500 MG TB24 Take 1 tablet by mouth daily. 09/27/20   [provider]  metoprolol tartrate (LOPRESSOR) 25 MG tablet Take 25 mg by mouth 2 (two) times daily.    [provider]  montelukast (SINGULAIR) 10 MG tablet Take 10 mg by mouth daily.     [provider]  Multiple Vitamin (MULTIVITAMIN WITH MINERALS) TABS tablet Take 1 tablet by mouth daily.    [provider]  Olopatadine HCl 0.2 % SOLN Apply 1 drop to eye at bedtime. 10/13/20   Raylene Everts, MD  ondansetron (ZOFRAN-ODT) 8 MG disintegrating  tablet Take 1 tablet (8 mg total) by mouth every 8 (eight) hours as needed for nausea or vomiting. 08/28/20   Jaynee Eagles, PA-C  pantoprazole (PROTONIX) 40 MG tablet Take 40 mg by mouth 2 (two) times daily.  11/20/19   [provider]  sucralfate (CARAFATE) 1 g tablet Take 1 g by mouth 4 (four) times daily.  02/11/20   [provider]  zinc gluconate 50 MG tablet Take 50 mg by mouth daily.    [provider]  esomeprazole (NEXIUM) 40 MG capsule Take 1 capsule (40 mg total) by mouth daily. Patient not taking: Reported on  07/08/2020 04/24/19 08/06/20  Charlesetta Shanks, MD  WIXELA INHUB 250-50 MCG/DOSE AEPB Inhale 1 puff into the lungs daily. 08/06/20 10/13/20  Vanessa Kick, MD    Family History Family History  Problem Relation Age of Onset  . Diabetes Father        type 2  . Heart disease Father   . Hypertension Father   . Heart disease Brother   . Hypertension Brother     Social History Social History   Tobacco Use  . Smoking status: Former Smoker    Packs/day: 0.50    Years: 30.00    Pack years: 15.00    Quit date: 12/08/1994    Years since quitting: 26.0  . Smokeless tobacco: Never Used  Vaping Use  . Vaping Use: Never used  Substance Use Topics  . Alcohol use: No  . Drug use: No     Allergies   Crab [shellfish allergy], Erythromycin, Hm lidocaine patch [lidocaine], Erdafitinib, Fish allergy, Metoclopramide, Sulfacetamide, Doxycycline, Levaquin [levofloxacin in d5w], Morphine, Morphine and related, and Sulfa antibiotics   Review of Systems Review of Systems  Respiratory: Negative.   Cardiovascular: Negative.   Gastrointestinal: Negative.   Musculoskeletal: Positive for back pain.  Neurological: Negative for dizziness, weakness and numbness.     Physical Exam Triage Vital Signs ED Triage Vitals  Enc Vitals Group     BP 12/04/20 1649 136/77     Pulse Rate 12/04/20 1649 82     Resp 12/04/20 1649 20     Temp 12/04/20 1649 98.5 F (36.9 C)      Temp Source 12/04/20 1649 Oral     SpO2 12/04/20 1649 99 %     Weight --      Height --      Head Circumference --      Peak Flow --      Pain Score 12/04/20 1647 9     Pain Loc --      Pain Edu? --      Excl. in [place]? --    No data found.  Updated Vital Signs BP 136/77 (BP Location: Right Arm)   Pulse 82   Temp 98.5 F (36.9 C) (Oral)   Resp 20   SpO2 99%   Visual Acuity Right Eye Distance:   Left Eye Distance:   Bilateral Distance:    Right Eye Near:   Left Eye Near:    Bilateral Near:     Physical Exam Vitals and nursing note reviewed.  Constitutional:      General: She is not in acute distress.    Appearance: She is not ill-appearing.  Cardiovascular:     Rate and Rhythm: Normal rate and regular rhythm.     Pulses: Normal pulses.     Heart sounds: Normal heart sounds.  Skin:    Capillary Refill: Capillary refill takes less than 2 seconds.  Neurological:     General: No focal deficit present.     Mental Status: She is alert and oriented to person, place, and time.      UC Treatments / Results  Labs (all labs ordered are listed, but only abnormal results are displayed) Labs Reviewed - No data to display  EKG   Radiology No results found.  Procedures Procedures (including critical care time)  Medications Ordered in UC Medications  ketorolac (TORADOL) 30 MG/ML injection 30 mg (30 mg Intramuscular Given 12/04/20 1832)    Initial Impression / Assessment and Plan / UC Course  I have  reviewed the triage vital signs and the nursing notes.  Pertinent labs & imaging results that were available during my care of the patient were reviewed by me and considered in my medical decision making (see chart for details).     1.  Sciatica of the left side: I offered patient Toradol but she refused Ibuprofen 600 mg every 6 hours as needed for pain Flexeril as needed for muscle spasms If symptoms are persistent or if there is no improvement in symptoms patient  will need to see an orthopedic surgeon for further management. Return precautions. Final Clinical Impressions(s) / UC Diagnoses   Final diagnoses:  Sciatica of left side     Discharge Instructions     Take the medication Gentle range range of motion Heating pad use If pain worsens please return to the urgent care to be re-evaluated   ED Prescriptions    Medication Sig Dispense Auth. Provider   ibuprofen (ADVIL) 600 MG tablet Take 1 tablet (600 mg total) by mouth every 6 (six) hours as needed. 30 tablet Ramonda Galyon, Myrene Galas, MD   cyclobenzaprine (FLEXERIL) 5 MG tablet Take 1 tablet (5 mg total) by mouth at bedtime as needed for muscle spasms. 20 tablet Syriah Delisi, Myrene Galas, MD     PDMP not reviewed this encounter.   Chase Picket, MD 12/06/20 2119

## 2020-12-11 ENCOUNTER — Ambulatory Visit: Payer: Medicare HMO

## 2021-01-06 ENCOUNTER — Other Ambulatory Visit: Payer: Self-pay

## 2021-01-06 ENCOUNTER — Ambulatory Visit (HOSPITAL_COMMUNITY)
Admission: EM | Admit: 2021-01-06 | Discharge: 2021-01-06 | Disposition: A | Discharge status: Home / Self Care | Payer: Medicare HMO | Attending: Emergency Medicine | Admitting: Emergency Medicine

## 2021-01-06 ENCOUNTER — Encounter (HOSPITAL_COMMUNITY): Payer: Self-pay | Admitting: *Deleted

## 2021-01-06 DIAGNOSIS — J441 Chronic obstructive pulmonary disease with (acute) exacerbation: Secondary | ICD-10-CM

## 2021-01-06 DIAGNOSIS — J069 Acute upper respiratory infection, unspecified: Secondary | ICD-10-CM

## 2021-01-06 MED ORDER — GUAIFENESIN ER 600 MG PO TB12: 1200.0000 mg | ORAL_TABLET | Freq: Two times a day (BID) | ORAL | 0 refills | Status: DC | PRN

## 2021-01-06 MED ORDER — PREDNISONE 10 MG PO TABS: 40.0000 mg | ORAL_TABLET | Freq: Every day | ORAL | 0 refills | Status: AC

## 2021-01-06 NOTE — Discharge Instructions (Signed)
Take the prednisone and Mucinex as directed.    Follow up with your primary care provider in 1 week.

## 2021-01-06 NOTE — ED Provider Notes (Signed)
[facility]    CSN: [id number] Arrival date & time: 01/06/21  4142      History   Chief Complaint Chief Complaint  Patient presents with  . Otalgia    RT  . Sore Throat  . Cough  . Headache    HPI Pamela Roberson is a 65 y.o. female.   Patient presents with 3-week history of cough productive of clear phlegm, right ear pain, sore throat, headache, post nasal drip.  She denies fever, chills, shortness of breath, vomiting, diarrhea, or other symptoms.  Treatment attempted at home with albuterol inhaler and nebulizer.  Her medical history includes hypertension, diabetes, COPD, asthma, chronic low back pain.  The history is provided by the patient and medical records.    Past Medical History:  Diagnosis Date  . Arthritis   . Asthma   . Carotid stenosis, bilateral   . Chronic low back pain   . COPD (chronic obstructive pulmonary disease) ([place])   . Diabetes mellitus without complication ([place])    type II   . Diabetic neuropathy ([place])   . Diverticulitis   . Dyspnea    mild   . Frequent falls   . GERD (gastroesophageal reflux disease)   . History of bronchitis   . Hypercholesterolemia   . Hypertension   . IBS (irritable bowel syndrome)   . Osteoarthritis    knee  . Palpitations   . Sleep apnea    mild but no sleep apnea   . TMJ (dislocation of temporomandibular joint)   . 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 Vitamin D deficiency     Patient Active Problem List   Diagnosis Date Noted  . Adenomatous polyp of colon 12/04/2020  . Anal fissure 12/04/2020  . Chronic idiopathic constipation 12/04/2020  . Constipation 12/04/2020  . Dysphagia 12/04/2020  . Gastroesophageal reflux disease 12/04/2020  . Rectal pain 12/04/2020  . Right upper quadrant pain 12/04/2020  . Dizzy 12/11/2017  . Pharyngeal dysphagia 12/11/2017  . Post-nasal drip 12/11/2017  . Referred ear pain, bilateral 12/11/2017  . Diabetes mellitus without complication ([place]) 39/53/2023   . Family history of glaucoma 06/29/2017  . Hyperopia of both eyes with astigmatism and presbyopia 06/29/2017  . Keratoconjunctivitis sicca of both eyes not specified as Sjogren's 06/29/2017  . Nuclear sclerotic cataract of both eyes 06/29/2017  . Vitreous floater, bilateral 06/29/2017  . Biceps tendinitis of right shoulder 04/05/2017  . Impingement syndrome of right shoulder 04/05/2017  . Arthritis of right acromioclavicular joint 04/05/2017  . Partial nontraumatic tear of rotator cuff, right 04/05/2017    Past Surgical History:  Procedure Laterality Date  . ABDOMINAL HYSTERECTOMY  1993  . CARPAL TUNNEL RELEASE Right   . CERVICAL DISC SURGERY  04/29/2015   Dr Tammi Klippel, [place], [place]  . ESOPHAGOGASTRODUODENOSCOPY ENDOSCOPY     with esophagus being stretched twice per pt,   . EYE SURGERY    . 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 feeding tube placement and removal   2015  . NASAL SEPTUM SURGERY    . spurs removed from esophagus   2015  . TONSILLECTOMY      OB History   No obstetric history on file.      Home Medications    Prior to Admission medications   Medication Sig Start Date End Date Taking? Authorizing Provider  acyclovir (ZOVIRAX) 400 MG tablet Take 400 mg by mouth 2 (two) times daily.  Yes [provider]  albuterol (PROAIR HFA) 108 (90 Base) MCG/ACT inhaler Inhale 1-2 puffs into the lungs every 6 (six) hours as needed for wheezing or shortness of breath. 06/14/20  Yes Burky, Lanelle Bal B, NP  albuterol (PROVENTIL) (2.5 MG/3ML) 0.083% nebulizer solution Take 3 mLs (2.5 mg total) by nebulization every 4 (four) hours as needed for wheezing or shortness of breath. 03/11/19  Yes Raylene Everts, MD  Artificial Tear Ointment (DRY EYES OP) Place 1-2 drops into both eyes daily as needed (for dry eyes).    Yes [provider]  atorvastatin (LIPITOR) 10 MG tablet Take 10 mg by mouth daily.   Yes [provider]  dicyclomine (BENTYL) 20 MG tablet  Take 20 mg by mouth 3 (three) times daily. 10/17/19  Yes [provider]  fluticasone (FLONASE) 50 MCG/ACT nasal spray Place 1 spray into both nostrils daily as needed for allergies or rhinitis.   Yes [provider]  gabapentin (NEURONTIN) 300 MG capsule Take 300 mg by mouth 3 (three) times daily.   Yes [provider]  guaiFENesin (MUCINEX) 600 MG 12 hr tablet Take 2 tablets (1,200 mg total) by mouth 2 (two) times daily as needed. 01/06/21  Yes Sharion Balloon, NP  ibuprofen (ADVIL) 600 MG tablet Take 1 tablet (600 mg total) by mouth every 6 (six) hours as needed. 12/04/20  Yes [place], Myrene Galas, MD  JANUMET XR 50-500 MG TB24 Take 1 tablet by mouth daily. 09/27/20  Yes [provider]  metoprolol tartrate (LOPRESSOR) 25 MG tablet Take 25 mg by mouth 2 (two) times daily.   Yes [provider]  montelukast (SINGULAIR) 10 MG tablet Take 10 mg by mouth daily.    Yes [provider]  Olopatadine HCl 0.2 % SOLN Apply 1 drop to eye at bedtime. 10/13/20  Yes Raylene Everts, MD  ondansetron (ZOFRAN-ODT) 8 MG disintegrating tablet Take 1 tablet (8 mg total) by mouth every 8 (eight) hours as needed for nausea or vomiting. 08/28/20  Yes Jaynee Eagles, PA-C  pantoprazole (PROTONIX) 40 MG tablet Take 40 mg by mouth 2 (two) times daily.  11/20/19  Yes [provider]  predniSONE (DELTASONE) 10 MG tablet Take 4 tablets (40 mg total) by mouth daily for 5 days. 01/06/21 01/11/21 Yes Sharion Balloon, NP  sucralfate (CARAFATE) 1 g tablet Take 1 g by mouth 4 (four) times daily.  02/11/20  Yes [provider]  Ascorbic Acid (VITAMIN C) 1000 MG tablet Take 1,000 mg by mouth daily.    [provider]  cholecalciferol (VITAMIN D3) 25 MCG (1000 UNIT) tablet Take 2,000 Units by mouth daily.     [provider]  COVID-19 Specimen Collection KIT See admin instructions. for testing 09/16/20   [provider]  cyclobenzaprine (FLEXERIL) 5 MG  tablet Take 1 tablet (5 mg total) by mouth at bedtime as needed for muscle spasms. 12/04/20   Chase Picket, MD  EPINEPHrine 0.3 mg/0.3 mL IJ SOAJ injection Inject 0.3 mg into the muscle once as needed for anaphylaxis.     [provider]  fexofenadine (ALLEGRA) 180 MG tablet Take 1 tablet (180 mg total) by mouth daily. 09/07/19   Shelda Pal, DO  HYDROcodone-Acetaminophen [phone] MG/15ML SOLN Take by mouth.    [provider]  hydrocortisone 2.5 % cream Apply topically. 09/24/20   [provider]  Multiple Vitamin (MULTIVITAMIN WITH MINERALS) TABS tablet Take 1 tablet by mouth daily.  [provider]  zinc gluconate 50 MG tablet Take 50 mg by mouth daily.    [provider]  esomeprazole (NEXIUM) 40 MG capsule Take 1 capsule (40 mg total) by mouth daily. Patient not taking: Reported on 07/08/2020 04/24/19 08/06/20  Charlesetta Shanks, MD  [place] INHUB 250-50 MCG/DOSE AEPB Inhale 1 puff into the lungs daily. 08/06/20 10/13/20  Vanessa Kick, MD    Family History Family History  Problem Relation Age of Onset  . Diabetes Father        type 2  . Heart disease Father   . Hypertension Father   . Heart disease Brother   . Hypertension Brother     Social History Social History   Tobacco Use  . Smoking status: Former Smoker    Packs/day: 0.50    Years: 30.00    Pack years: 15.00    Quit date: 12/08/1994    Years since quitting: 26.0  . Smokeless tobacco: Never Used  Vaping Use  . Vaping Use: Never used  Substance Use Topics  . Alcohol use: No  . Drug use: No     Allergies   Crab [shellfish allergy], Erythromycin, Hm lidocaine patch [lidocaine], Erdafitinib, Fish allergy, Metoclopramide, Sulfacetamide, Doxycycline, Levaquin [levofloxacin in d5w], Morphine, Morphine and related, and Sulfa antibiotics   Review of Systems Review of Systems  Constitutional: Negative for chills and fever.  HENT: Positive for ear pain and sore throat.    Eyes: Negative for pain and visual disturbance.  Respiratory: Positive for cough. Negative for shortness of breath.   Cardiovascular: Negative for chest pain and palpitations.  Gastrointestinal: Negative for abdominal pain, diarrhea and vomiting.  Genitourinary: Negative for dysuria and hematuria.  Musculoskeletal: Negative for arthralgias and back pain.  Skin: Negative for color change and rash.  Neurological: Positive for headaches. Negative for seizures and syncope.  All other systems reviewed and are negative.    Physical Exam Triage Vital Signs ED Triage Vitals [01/06/21 0950]  Enc Vitals Group     BP      Pulse      Resp      Temp      Temp src      SpO2      Weight      Height      Head Circumference      Peak Flow      Pain Score 8     Pain Loc      Pain Edu?      Excl. in [place]?    No data found.  Updated Vital Signs BP 117/76 (BP Location: Right Arm)   Pulse 74   Temp 98.6 F (37 C) (Oral)   Resp 18   SpO2 99%   Visual Acuity Right Eye Distance:   Left Eye Distance:   Bilateral Distance:    Right Eye Near:   Left Eye Near:    Bilateral Near:     Physical Exam Vitals and nursing note reviewed.  Constitutional:      General: She is not in acute distress.    Appearance: She is well-developed and well-nourished. She is not ill-appearing.  HENT:     Head: Normocephalic and atraumatic.     Right Ear: Tympanic membrane and ear canal normal.     Left Ear: Tympanic membrane and ear canal normal.     Nose: Nose normal.     Mouth/Throat:     Mouth: Mucous membranes are moist.     Pharynx: Oropharynx  is clear.     Tonsils: 0 on the right. 0 on the left.  Eyes:     Conjunctiva/sclera: Conjunctivae normal.  Cardiovascular:     Rate and Rhythm: Normal rate and regular rhythm.     Heart sounds: Normal heart sounds.  Pulmonary:     Effort: Pulmonary effort is normal. No respiratory distress.     Breath sounds: Wheezing present.     Comments: Few  expiratory wheezes.  Abdominal:     Palpations: Abdomen is soft.     Tenderness: There is no abdominal tenderness.  Musculoskeletal:        General: No edema.     Cervical back: Neck supple.  Skin:    General: Skin is warm and dry.  Neurological:     General: No focal deficit present.     Mental Status: She is alert and oriented to person, place, and time.     Gait: Gait normal.  Psychiatric:        Mood and Affect: Mood and affect and mood normal.        Behavior: Behavior normal.      UC Treatments / Results  Labs (all labs ordered are listed, but only abnormal results are displayed) Labs Reviewed  SARS CORONAVIRUS 2 (TAT 6-24 HRS)    EKG   Radiology No results found.  Procedures Procedures (including critical care time)  Medications Ordered in UC Medications - No data to display  Initial Impression / Assessment and Plan / UC Course  I have reviewed the triage vital signs and the nursing notes.  Pertinent labs & imaging results that were available during my care of the patient were reviewed by me and considered in my medical decision making (see chart for details).   COPD exacerbation, viral URI.  Treating with prednisone and continued use of albuterol.  Also treating with Mucinex.  PCR COVID pending.  Instructed patient to self quarantine until the test result is back.  Instructed her to follow-up with her PCP in 1 week.  She agrees to plan of care.   Final Clinical Impressions(s) / UC Diagnoses   Final diagnoses:  COPD exacerbation ([place])  Viral upper respiratory tract infection     Discharge Instructions     Take the prednisone and Mucinex as directed.    Follow up with your primary care provider in 1 week.        ED Prescriptions    Medication Sig Dispense Auth. Provider   guaiFENesin (MUCINEX) 600 MG 12 hr tablet Take 2 tablets (1,200 mg total) by mouth 2 (two) times daily as needed. 12 tablet Sharion Balloon, NP   predniSONE (DELTASONE) 10 MG  tablet Take 4 tablets (40 mg total) by mouth daily for 5 days. 20 tablet Sharion Balloon, NP     PDMP not reviewed this encounter.   Sharion Balloon, NP 01/06/21 1019

## 2021-01-06 NOTE — ED Triage Notes (Signed)
Pt reports a 3 week Hx of Cough ,HA,sore throat,RT ear pain .

## 2021-01-07 ENCOUNTER — Ambulatory Visit (INDEPENDENT_AMBULATORY_CARE_PROVIDER_SITE_OTHER): Payer: Medicare HMO | Admitting: Allergy

## 2021-01-07 ENCOUNTER — Encounter: Payer: Self-pay | Admitting: Allergy

## 2021-01-07 VITALS — BP 128/80 | HR 84 | Temp 97.3°F | Resp 20 | Ht 66.5 in | Wt 200.0 lb

## 2021-01-07 DIAGNOSIS — J3089 Other allergic rhinitis: Secondary | ICD-10-CM | POA: Diagnosis not present

## 2021-01-07 DIAGNOSIS — T7800XD Anaphylactic reaction due to unspecified food, subsequent encounter: Secondary | ICD-10-CM | POA: Diagnosis not present

## 2021-01-07 DIAGNOSIS — J4540 Moderate persistent asthma, uncomplicated: Secondary | ICD-10-CM

## 2021-01-07 DIAGNOSIS — T50905D Adverse effect of unspecified drugs, medicaments and biological substances, subsequent encounter: Secondary | ICD-10-CM

## 2021-01-07 DIAGNOSIS — K2100 Gastro-esophageal reflux disease with esophagitis, without bleeding: Secondary | ICD-10-CM | POA: Diagnosis not present

## 2021-01-07 MED ORDER — ADVAIR HFA 115-21 MCG/ACT IN AERO: 2.0000 | INHALATION_SPRAY | Freq: Two times a day (BID) | RESPIRATORY_TRACT | 5 refills | Status: DC

## 2021-01-07 MED ORDER — IPRATROPIUM BROMIDE 0.06 % NA SOLN: 2.0000 | Freq: Two times a day (BID) | NASAL | 5 refills | Status: DC

## 2021-01-07 NOTE — Progress Notes (Signed)
New Patient Note  RE: Pamela Roberson MRN: [id number] DOB: 1956/06/19 Date of Office Visit: 01/07/2021  Referring provider: Glendon Axe, MD Primary care provider: Glendon Axe, MD  Chief Complaint: environmental allergies, drug allergy, copd/asthma  History of present illness: Pamela Roberson is a 65 y.o. female presenting today for consultation for allergies.    She has year-round allergy symptoms and reports difficulty breathing, ear itching, sneezing, congestion, nasal drainage, sore throat, itchy skin. She has tried OTC antihistamines and states doesn't help.  She has used flonase which also hasn't helped.   She takes singulair for at least the past 4 years.   She has seen a allergist before in [place], [place]. States she was on allergy shots but had to stop as she was feeling weak on them.    She has albuterol inhaler and albuterol for nebulizer.  She states was on Wixela but has been off for 6-7 months.  She is not sure why it was never refilled.  She states she has breathing issues couple times a week where she has difficulty breathing, dry cough, wheezing, chest tightness.  No nighttime awakenings.  She notes worsening breathing inside her home. She states she has been told she has COPD and also told she has asthma. She is not sure which one she has.    She reports she has bad acid reflux and sleeps propped up.  She is on protonix.  She states she is getting referral for GI for reflux.    She states she has a fish allergy.  She states she was told she needed to avoid salmon.  She states she tested positive to salmon.  She states she may have had nausea when eating salmon before but states she has issues with nausea at baseline.  She wants to be able to salmon if she can.  She also avoids shellfish and states she has never had crab or lobster.  She states she eats oysters which never caused an issue.  She eats shrimp, flounder, black bass, tuna, and catfish without issue as well.    She has an epipen which she has never needed to use.   She has multiple drug allergies as well which will be addressed at a separate visit.   Review of systems: Review of Systems  Constitutional: Negative.   HENT:       See HPI  Eyes: Negative.   Respiratory:       See HPI  Cardiovascular: Negative.   Gastrointestinal:       See HPI  Musculoskeletal: Negative.   Skin: Negative.   Neurological: Negative.     All other systems negative unless noted above in HPI  Past medical history: Past Medical History:  Diagnosis Date  . Arthritis   . Asthma   . Carotid stenosis, bilateral   . Chronic low back pain   . COPD (chronic obstructive pulmonary disease) ([place])   . Diabetes mellitus without complication ([place])    type II   . Diabetic neuropathy ([place])   . Diverticulitis   . Dyspnea    mild   . Frequent falls   . GERD (gastroesophageal reflux disease)   . History of bronchitis   . Hypercholesterolemia   . Hypertension   . IBS (irritable bowel syndrome)   . Osteoarthritis    knee  . Palpitations   . Sleep apnea    mild but no sleep apnea   . TMJ (dislocation of temporomandibular joint)   . Tuberculosis  hx of positive TB test   . Vitamin D deficiency     Past surgical history: Past Surgical History:  Procedure Laterality Date  . ABDOMINAL HYSTERECTOMY  1993  . CARPAL TUNNEL RELEASE Right   . CERVICAL DISC SURGERY  04/29/2015   Dr Tammi Klippel, [place], [place]  . ESOPHAGOGASTRODUODENOSCOPY ENDOSCOPY     with esophagus being stretched twice per pt,   . EYE SURGERY    . 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 feeding tube placement and removal   2015  . NASAL SEPTUM SURGERY    . spurs removed from esophagus   2015  . TONSILLECTOMY      Family history:  Family History  Problem Relation Age of Onset  . Allergic rhinitis Mother   . Diabetes Father        type 2  . Heart disease Father   . Hypertension Father   . Allergic rhinitis Father   . Heart disease  Brother   . Hypertension Brother   . Allergic rhinitis Sister   . Angioedema Neg Hx   . Asthma Neg Hx   . Atopy Neg Hx   . Eczema Neg Hx   . Immunodeficiency Neg Hx   . Urticaria Neg Hx     Social history: Lives in a home without carpeting with electric heating and window cooling.  no concern for roaches in the home.  There is concern for water damage or mildew.  She is disabled.    Tobacco Use  . Smoking status: Former Smoker    Packs/day: 0.50    Years: 30.00    Pack years: 15.00    Quit date: 12/08/1994    Years since quitting: 26.1  . Smokeless tobacco: Never Used  Vaping Use  . Vaping Use: Never used     Medication List: Current Outpatient Medications  Medication Sig Dispense Refill  . acyclovir (ZOVIRAX) 400 MG tablet Take 400 mg by mouth 2 (two) times daily.    Marland Kitchen albuterol (PROAIR HFA) 108 (90 Base) MCG/ACT inhaler Inhale 1-2 puffs into the lungs every 6 (six) hours as needed for wheezing or shortness of breath. 8 g 0  . albuterol (PROVENTIL) (2.5 MG/3ML) 0.083% nebulizer solution Take 3 mLs (2.5 mg total) by nebulization every 4 (four) hours as needed for wheezing or shortness of breath. 30 vial 0  . Artificial Tear Ointment (DRY EYES OP) Place 1-2 drops into both eyes daily as needed (for dry eyes).     . Ascorbic Acid (VITAMIN C) 1000 MG tablet Take 1,000 mg by mouth daily.    Marland Kitchen atorvastatin (LIPITOR) 10 MG tablet Take 10 mg by mouth daily.    . cholecalciferol (VITAMIN D3) 25 MCG (1000 UNIT) tablet Take 2,000 Units by mouth daily.     Marland Kitchen dicyclomine (BENTYL) 20 MG tablet Take 20 mg by mouth 3 (three) times daily.    Marland Kitchen EPINEPHrine 0.3 mg/0.3 mL IJ SOAJ injection Inject 0.3 mg into the muscle once as needed for anaphylaxis.     . fluticasone (FLONASE) 50 MCG/ACT nasal spray Place 1 spray into both nostrils daily as needed for allergies or rhinitis.    Marland Kitchen gabapentin (NEURONTIN) 300 MG capsule Take 300 mg by mouth 3 (three) times daily.    Marland Kitchen guaiFENesin (MUCINEX) 600 MG 12  hr tablet Take 2 tablets (1,200 mg total) by mouth 2 (two) times daily as needed. 12 tablet 0  . HYDROcodone-Acetaminophen 10-325 MG/15ML SOLN Take by mouth.    Marland Kitchen  ibuprofen (ADVIL) 600 MG tablet Take 1 tablet (600 mg total) by mouth every 6 (six) hours as needed. 30 tablet 0  . JANUMET XR 50-500 MG TB24 Take 1 tablet by mouth daily.    . metoprolol tartrate (LOPRESSOR) 25 MG tablet Take 25 mg by mouth 2 (two) times daily.    . montelukast (SINGULAIR) 10 MG tablet Take 10 mg by mouth daily.     . Multiple Vitamin (MULTIVITAMIN WITH MINERALS) TABS tablet Take 1 tablet by mouth daily.    . ondansetron (ZOFRAN-ODT) 8 MG disintegrating tablet Take 1 tablet (8 mg total) by mouth every 8 (eight) hours as needed for nausea or vomiting. 20 tablet 0  . pantoprazole (PROTONIX) 40 MG tablet Take 40 mg by mouth 2 (two) times daily.     . predniSONE (DELTASONE) 10 MG tablet Take 4 tablets (40 mg total) by mouth daily for 5 days. 20 tablet 0  . sucralfate (CARAFATE) 1 g tablet Take 1 g by mouth 4 (four) times daily.     . cyclobenzaprine (FLEXERIL) 5 MG tablet Take 1 tablet (5 mg total) by mouth at bedtime as needed for muscle spasms. (Patient not taking: Reported on 01/07/2021) 20 tablet 0   No current facility-administered medications for this visit.    Known medication allergies: Allergies  Allergen Reactions  . Crab [Shellfish Allergy] Shortness Of Breath and Swelling  . Erythromycin Swelling and Rash  . Hm Lidocaine Patch [Lidocaine]     Burning on skin  . Erdafitinib     Other reaction(s): pt not sure  . Fish Allergy     Swelling   . Metoclopramide Other (See Comments)    Slurred speech and unable to move muscles,  Caused her to drag her feet  . Sulfacetamide     Other reaction(s): itching, rash  . Doxycycline     GI Intolerance  . Levaquin [Levofloxacin In D5w] Other (See Comments)    Causes irregular heart beat  . Morphine Nausea And Vomiting and Rash    Irregular heart beat  . Morphine  And Related Nausea And Vomiting and Rash    Irregular heart beat  . Sulfa Antibiotics Rash     Physical examination: Blood pressure 128/80, pulse 84, temperature (!) 97.3 F (36.3 C), temperature source Temporal, resp. rate 20, height 5' 6.5" (1.689 m), weight 200 lb (90.7 kg), SpO2 98 %.  General: Alert, interactive, in no acute distress. HEENT: PERRLA, TMs pearly gray, turbinates minimally edematous without discharge, post-pharynx non erythematous. Neck: Supple without lymphadenopathy. Lungs: Clear to auscultation without wheezing, rhonchi or rales. {no increased work of breathing. CV: Normal S1, S2 without murmurs. Abdomen: Nondistended, nontender. Skin: Warm and dry, without lesions or rashes. Extremities:  No clubbing, cyanosis or edema. Neuro:   Grossly intact.  Diagnositics/Labs:  Spirometry: FEV1: 1.57L 69%, FVC: 1.87L 65% predicted.  S/p albuterol she had a significant improvement in FEV1 to 1.99L 88% which is 27% increase and a 39% increase in FVC to 259L 90% predicted.  Study normalized  Allergy testing:environmental allergy skin prick testing is positive to sweet vernal and tobacco leaf.  Select food allergy skin prick testing is negative to trout, salmon, codfish, crab, lobster, scallops.  Allergy testing results were read and interpreted by provider, documented by clinical staff.   Assessment and plan: Allergic rhinitis  -environmental allergy testing is positive to grass pollen and tobacco leaf -allergen avoidance measures discussed/handouts provided -try Xyzal 5mg  daily.  This is a long-acting antihistamine -try nasal  atrovent 2 sprays each nostril twice a day for nasal congestion and drainage control  Moderate persistent asthma -continue Singulair 10mg  daily at bedtime -resume Advair (Wixela) 129mcg/21 take 2 puffs twice a day -have access to albuterol inhaler 2 puffs or albuterol 1 vial via nebulizer every 4-6 hours as needed for cough/wheeze/shortness of  breath/chest tightness.  May use 15-20 minutes prior to activity.   Monitor frequency of use.    Anaphylaxis due to food -food allergy testing is negative for fish and shellfish -will obtain serum IgE levels for fish and shellfish panel to determine if you are eligible for in-office challenges to see if you can put these food item back into your direct.  Continue current food avoidance for now - have access to self-injectable epinephrine Epipen 0.3mg  at all times - follow emergency action plan in case of allergic reaction  GERD -continue lifestyle modifications for reflux control (like propping up head of bed and minimizing foods that cause heartburn) -continue protonix daily -agree with GI referral for better control of symptoms  Adverse effects of medications -continue your current medication avoidance -will plan to address your medication allergies as next visit in detail  Follow-up in 3-4 months or sooner if needed      No follow-ups on file.  I appreciate the opportunity to take part in Kasen's care. Please do not hesitate to contact me with questions.  Sincerely,   Prudy Feeler, MD Allergy/Immunology [facility]

## 2021-01-07 NOTE — Patient Instructions (Addendum)
-  environmental allergy testing is positive to grass pollen and tobacco leaf -allergen avoidance measures discussed/handouts provided -try Xyzal 5mg  daily.  This is a long-acting antihistamine -try nasal atrovent 2 sprays each nostril twice a day for nasal congestion and drainage control  -continue Singulair 10mg  daily at bedtime -resume Advair (Wixela) 154mcg/21 take 2 puffs twice a day -have access to albuterol inhaler 2 puffs or albuterol 1 vial via nebulizer every 4-6 hours as needed for cough/wheeze/shortness of breath/chest tightness.  May use 15-20 minutes prior to activity.   Monitor frequency of use.    -food allergy testing is negative for fish and shellfish -will obtain serum IgE levels for fish and shellfish panel to determine if you are eligible for in-office challenges to see if you can put these food item back into your direct.  Continue current food avoidance for now - have access to self-injectable epinephrine Epipen 0.3mg  at all times - follow emergency action plan in case of allergic reaction  -continue lifestyle modifications for reflux control (like propping up head of bed and minimizing foods that cause heartburn) -continue protonix daily -agree with GI referral for better control of symptoms  -continue your current medication avoidance -will plan to address your medication allergies as next visit in detail  Follow-up in 3-4 months or sooner if needed

## 2021-01-12 ENCOUNTER — Telehealth: Payer: Self-pay

## 2021-01-12 LAB — ALLERGEN PROFILE, SHELLFISH
Clam IgE: <0.1 kU/L
F023-IgE Crab: <0.1 kU/L
F080-IgE Lobster: <0.1 kU/L
F290-IgE Oyster: <0.1 kU/L
Scallop IgE: <0.1 kU/L
Shrimp IgE: <0.1 kU/L

## 2021-01-12 LAB — ALLERGEN PROFILE, FOOD-FISH
Allergen Mackerel IgE: <0.1 kU/L
Allergen Salmon IgE: <0.1 kU/L
Allergen Trout IgE: <0.1 kU/L
Allergen Walley Pike IgE: <0.1 kU/L
Codfish IgE: <0.1 kU/L
Halibut IgE: <0.1 kU/L
Tuna: <0.1 kU/L

## 2021-01-12 NOTE — Telephone Encounter (Signed)
Great. please ensure she gets mailed the challenge instruction sheet

## 2021-01-12 NOTE — Telephone Encounter (Signed)
Pt scheduled a food challenge for 03/05/21 she would like to start with a fish challenge and would like to bring in salmon for that challenge.

## 2021-01-12 NOTE — Telephone Encounter (Signed)
Challenge protocol sheet has been sent.

## 2021-01-22 ENCOUNTER — Ambulatory Visit (HOSPITAL_COMMUNITY)
Admission: EM | Admit: 2021-01-22 | Discharge: 2021-01-22 | Disposition: A | Discharge status: Home / Self Care | Payer: Medicare HMO | Attending: Physician Assistant | Admitting: Physician Assistant

## 2021-01-22 ENCOUNTER — Emergency Department (HOSPITAL_COMMUNITY)
Admission: EM | Admit: 2021-01-22 | Discharge: 2021-01-22 | Disposition: A | Discharge status: Home / Self Care | Payer: Medicare HMO | Attending: Emergency Medicine | Admitting: Emergency Medicine

## 2021-01-22 ENCOUNTER — Other Ambulatory Visit: Payer: Self-pay

## 2021-01-22 ENCOUNTER — Emergency Department (HOSPITAL_COMMUNITY): Payer: Medicare HMO

## 2021-01-22 ENCOUNTER — Encounter (HOSPITAL_COMMUNITY): Payer: Self-pay

## 2021-01-22 DIAGNOSIS — Z5321 Procedure and treatment not carried out due to patient leaving prior to being seen by health care provider: Secondary | ICD-10-CM | POA: Insufficient documentation

## 2021-01-22 DIAGNOSIS — R059 Cough, unspecified: Secondary | ICD-10-CM | POA: Insufficient documentation

## 2021-01-22 DIAGNOSIS — M79602 Pain in left arm: Secondary | ICD-10-CM | POA: Insufficient documentation

## 2021-01-22 DIAGNOSIS — R12 Heartburn: Secondary | ICD-10-CM | POA: Insufficient documentation

## 2021-01-22 DIAGNOSIS — R0789 Other chest pain: Secondary | ICD-10-CM | POA: Diagnosis not present

## 2021-01-22 LAB — CBC
HCT: 42.7 % (ref 36.0–46.0)
Hemoglobin: 14.2 g/dL (ref 12.0–15.0)
MCH: 29.6 pg (ref 26.0–34.0)
MCHC: 33.3 g/dL (ref 30.0–36.0)
MCV: 89 fL (ref 80.0–100.0)
Platelets: 226 10*3/uL (ref 150–400)
RBC: 4.8 MIL/uL (ref 3.87–5.11)
RDW: 12.8 % (ref 11.5–15.5)
WBC: 5.2 10*3/uL (ref 4.0–10.5)
nRBC: 0 % (ref 0.0–0.2)

## 2021-01-22 LAB — BASIC METABOLIC PANEL
Anion gap: 10 (ref 5–15)
BUN: 10 mg/dL (ref 8–23)
CO2: 23 mmol/L (ref 22–32)
Calcium: 9.3 mg/dL (ref 8.9–10.3)
Chloride: 106 mmol/L (ref 98–111)
Creatinine, Ser: 0.89 mg/dL (ref 0.44–1.00)
GFR, Estimated: >60 mL/min (ref >60)
Glucose, Bld: 123 mg/dL — ABNORMAL HIGH (ref 70–99)
Potassium: 3.9 mmol/L (ref 3.5–5.1)
Sodium: 139 mmol/L (ref 135–145)

## 2021-01-22 LAB — TROPONIN I (HIGH SENSITIVITY): Troponin I (High Sensitivity): 2 ng/L (ref <18)

## 2021-01-22 NOTE — ED Triage Notes (Signed)
Pt reports left sided chest pain, left shoulder pain, left sided back pain, cough x 1 week; numbness and tingling left hand started today. States having acid reflux and burping x 1 week. Denies SOB, diarrhea, vision changes.

## 2021-01-22 NOTE — ED Notes (Signed)
Pt leaving AMA, advised to return if symptoms worsen. 

## 2021-01-22 NOTE — ED Triage Notes (Signed)
Pt reports 1 week of chest pressure, acid reflux with belching and cough. Today the left side of her chest was hurting with radiation into her left arm which is new today.

## 2021-01-22 NOTE — ED Provider Notes (Signed)
Pt complains of left sided chest pain that radiates down left arm and through to back.   PMH  Diabetes, hypertension carotid artery disease PE: vitals stable, Lungs clear, Heart rrr. EKG reviewed no stemi Pt advised she needs to go to ED for cardiac evaluation    Pamela Roberson, Hershal Coria 01/22/21 1634

## 2021-02-09 ENCOUNTER — Other Ambulatory Visit: Payer: Self-pay | Admitting: Gastroenterology

## 2021-02-26 ENCOUNTER — Ambulatory Visit: Payer: Medicare HMO

## 2021-03-01 ENCOUNTER — Other Ambulatory Visit (HOSPITAL_COMMUNITY)
Admission: RE | Admit: 2021-03-01 | Discharge: 2021-03-01 | Disposition: A | Discharge status: Home / Self Care | Payer: Medicare HMO | Source: Ambulatory Visit | Attending: Gastroenterology | Admitting: Gastroenterology

## 2021-03-01 ENCOUNTER — Encounter: Payer: Self-pay | Admitting: Podiatry

## 2021-03-01 ENCOUNTER — Other Ambulatory Visit: Payer: Self-pay

## 2021-03-01 ENCOUNTER — Ambulatory Visit (INDEPENDENT_AMBULATORY_CARE_PROVIDER_SITE_OTHER): Payer: Medicare HMO | Admitting: Podiatry

## 2021-03-01 DIAGNOSIS — B351 Tinea unguium: Secondary | ICD-10-CM

## 2021-03-01 DIAGNOSIS — E1142 Type 2 diabetes mellitus with diabetic polyneuropathy: Secondary | ICD-10-CM

## 2021-03-01 DIAGNOSIS — M79675 Pain in left toe(s): Secondary | ICD-10-CM

## 2021-03-01 DIAGNOSIS — L84 Corns and callosities: Secondary | ICD-10-CM

## 2021-03-01 DIAGNOSIS — M79674 Pain in right toe(s): Secondary | ICD-10-CM

## 2021-03-01 DIAGNOSIS — Z20822 Contact with and (suspected) exposure to covid-19: Secondary | ICD-10-CM | POA: Diagnosis not present

## 2021-03-01 DIAGNOSIS — M2012 Hallux valgus (acquired), left foot: Secondary | ICD-10-CM

## 2021-03-01 DIAGNOSIS — Z01812 Encounter for preprocedural laboratory examination: Secondary | ICD-10-CM | POA: Diagnosis present

## 2021-03-01 DIAGNOSIS — M2011 Hallux valgus (acquired), right foot: Secondary | ICD-10-CM

## 2021-03-01 LAB — SARS CORONAVIRUS 2 (TAT 6-24 HRS): SARS Coronavirus 2: NEGATIVE

## 2021-03-01 NOTE — Progress Notes (Signed)
Subjective: Pamela Roberson presents today for at risk foot care with history of diabetic neuropathy and callus(es) bilaterally and painful thick toenails that are difficult to trim. Painful toenails interfere with ambulation. Aggravating factors include wearing enclosed shoe gear. Pain is relieved with periodic professional debridement. Painful calluses are aggravated when weightbearing with and without shoegear. Pain is relieved with periodic professional debridement.  Patient states blood sugar was 170 mg/dL on yesterday.  Glendon Axe, MD is patient's PCP. Last visit was 6 months ago.  Allergies  Allergen Reactions  . Crab [Shellfish Allergy] Shortness Of Breath and Swelling  . Erythromycin Swelling and Rash  . Hm Lidocaine Patch [Lidocaine]     Burning on skin  . Erdafitinib     Other reaction(s): pt not sure  . Fish Allergy     Swelling   . Metoclopramide Other (See Comments)    Slurred speech and unable to move muscles,  Caused her to drag her feet  . Rosanil Cleanser [Sulfacetamide Sodium-Sulfur]     Other reaction(s): itching, rash  . Sulfacetamide     Other reaction(s): itching, rash  . Doxycycline     GI Intolerance  . Levaquin [Levofloxacin In D5w] Other (See Comments)    Causes irregular heart beat  . Morphine Nausea And Vomiting and Rash    Irregular heart beat  . Morphine And Related Nausea And Vomiting and Rash    Irregular heart beat  . Sulfa Antibiotics Rash   Review of Systems: Negative except as noted in the HPI.  Objective: There were no vitals filed for this visit.  Pamela Roberson is a pleasant 65 y.o. female in NAD. AAO X 3.  Vascular Examination: Capillary refill time to digits immediate b/l. Palpable DP pulse(s) b/l lower extremities Faintly palpable PT pulse(s) b/l lower extremities. Pedal hair absent. Lower extremity skin temperature gradient within normal limits.  Dermatological Examination: Pedal skin with normal turgor, texture and tone  bilaterally. No open wounds bilaterally. No interdigital macerations bilaterally. Toenails 1-5 b/l elongated, discolored, dystrophic, thickened, crumbly with subungual debris and tenderness to dorsal palpation. Hyperkeratotic lesion(s) submet head 1 right foot, submet head 5 left foot, submet head 5 right foot and sub 5th met base right foot.  No erythema, no edema, no drainage, no fluctuance.  Musculoskeletal Examination: Normal muscle strength 5/5 to all lower extremity muscle groups bilaterally. No pain crepitus or joint limitation noted with ROM b/l. No gross bony deformities bilaterally. Pain on palpation medial tubercle right heel. Pain along medial longitudinal arch right foot.  Neurological Examination: Pt has subjective symptoms of neuropathy. Protective sensation intact 5/5 intact bilaterally with 10g monofilament b/l. Vibratory sensation intact b/l. Proprioception intact bilaterally.   Assessment: 1. Pain due to onychomycosis of toenails of both feet   2. Callus   3. Hallux valgus, acquired, bilateral   4. Diabetic peripheral neuropathy associated with type 2 diabetes mellitus ([place])     Plan: -Examined patient. -Continue diabetic foot care principles daily. -Patient to continue soft, supportive shoe gear daily. -Toenails 1-5 b/l were debrided in length and girth with sterile nail nippers and dremel without iatrogenic bleeding.  -Callus(es) submet head 1 right foot, submet head 5 left foot, submet head 5 right foot and sub 5th met base right  foot pared utilizing sterile scalpel blade without complication or incident. Total number debrided =4. -Patient to report any pedal injuries to medical professional immediately. -Patient/POA to call should there be question/concern in the interim.  Return in about 3 months (around  06/01/2021).  Marzetta Board, DPM

## 2021-03-02 ENCOUNTER — Other Ambulatory Visit (HOSPITAL_COMMUNITY): Payer: Medicare HMO

## 2021-03-03 ENCOUNTER — Encounter (HOSPITAL_COMMUNITY)
Admission: RE | Disposition: A | Discharge status: Home / Self Care | Payer: Self-pay | Source: Home / Self Care | Attending: Gastroenterology

## 2021-03-03 ENCOUNTER — Encounter (HOSPITAL_COMMUNITY): Payer: Self-pay | Admitting: Gastroenterology

## 2021-03-03 ENCOUNTER — Ambulatory Visit (HOSPITAL_COMMUNITY)
Admission: RE | Admit: 2021-03-03 | Discharge: 2021-03-03 | Disposition: A | Discharge status: Home / Self Care | Payer: Medicare HMO | Attending: Gastroenterology | Admitting: Gastroenterology

## 2021-03-03 DIAGNOSIS — R1310 Dysphagia, unspecified: Secondary | ICD-10-CM | POA: Insufficient documentation

## 2021-03-03 DIAGNOSIS — R12 Heartburn: Secondary | ICD-10-CM | POA: Insufficient documentation

## 2021-03-03 HISTORY — PX: ESOPHAGEAL MANOMETRY: SHX5429

## 2021-03-03 SURGERY — MANOMETRY, ESOPHAGUS

## 2021-03-03 MED ORDER — LIDOCAINE VISCOUS HCL 2 % MT SOLN
OROMUCOSAL | Status: AC
  Filled 2021-03-03: qty 15

## 2021-03-03 SURGICAL SUPPLY — 2 items
FACESHIELD LNG OPTICON STERILE (SAFETY) IMPLANT
GLOVE BIO SURGEON STRL SZ8 (GLOVE) ×4 IMPLANT

## 2021-03-03 NOTE — Progress Notes (Addendum)
Esophageal manometry performed per protocol with 2 RNs to place probe.  Patient tolerated procedure but decided she would not be able to tolerate pH probe for 24.  Informed patient if she changed her mind or felt it needed to be done she would not have to have another manometry.  24 pH study was cancelled and Dr Michail Sermon was informed.  No lidocaine was used for procedure due to patient allery.  Surgilube was used instead.

## 2021-03-05 ENCOUNTER — Encounter: Payer: Medicare HMO | Admitting: Allergy

## 2021-03-23 ENCOUNTER — Ambulatory Visit
Admission: RE | Admit: 2021-03-23 | Discharge: 2021-03-23 | Disposition: A | Discharge status: Home / Self Care | Payer: Medicare HMO | Source: Ambulatory Visit | Attending: Family Medicine | Admitting: Family Medicine

## 2021-03-23 ENCOUNTER — Other Ambulatory Visit: Payer: Self-pay

## 2021-03-23 DIAGNOSIS — Z1231 Encounter for screening mammogram for malignant neoplasm of breast: Secondary | ICD-10-CM

## 2021-03-24 ENCOUNTER — Other Ambulatory Visit: Payer: Self-pay | Admitting: Family Medicine

## 2021-03-24 DIAGNOSIS — R928 Other abnormal and inconclusive findings on diagnostic imaging of breast: Secondary | ICD-10-CM

## 2021-04-06 ENCOUNTER — Ambulatory Visit (HOSPITAL_COMMUNITY)
Admission: EM | Admit: 2021-04-06 | Discharge: 2021-04-06 | Disposition: A | Discharge status: Home / Self Care | Payer: Medicare HMO | Attending: Emergency Medicine | Admitting: Emergency Medicine

## 2021-04-06 ENCOUNTER — Other Ambulatory Visit: Payer: Self-pay

## 2021-04-06 ENCOUNTER — Encounter (HOSPITAL_COMMUNITY): Payer: Self-pay | Admitting: Emergency Medicine

## 2021-04-06 DIAGNOSIS — J069 Acute upper respiratory infection, unspecified: Secondary | ICD-10-CM

## 2021-04-06 MED ORDER — PROMETHAZINE-DM 6.25-15 MG/5ML PO SYRP: 2.5000 mL | ORAL_SOLUTION | Freq: Four times a day (QID) | ORAL | 0 refills | Status: DC | PRN

## 2021-04-06 NOTE — Discharge Instructions (Addendum)
Can use 2.5 to 5 mL of cough syrup every 4 hours as needed  Can continue use of nasal spray as prescribed to further help with congestion  Can use 400 mg of ibuprofen or 650 mg of Tylenol every 6 hours to help with chest soreness  Can use salt water gargles, hot liquids, throat lozenges or hard candies, teaspoon of honey for sore throat

## 2021-04-06 NOTE — ED Triage Notes (Signed)
Pt presents with productive cough and congestion xs 5 days. States side and back are hurting from coughing.

## 2021-04-06 NOTE — ED Provider Notes (Signed)
[place]    CSN: [id number] Arrival date & time: 04/06/21  0805      History   Chief Complaint Chief Complaint  Patient presents with  . Cough  . Nasal Congestion    HPI Pamela Roberson is a 65 y.o. female.   Patient presents with nasal congestion, rhinorrhea, postnasal drip, sore throat, itchy ears, productive cough, nausea for 1 week.  Endorses chest soreness from coughing.  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  Denies fever, vomiting, diarrhea, headaches, shortness of breath, chest pain, sinus pressure.  Attempted use of nighttime Robitussin with no relief.  Past Medical History:  Diagnosis Date  . Arthritis   . Asthma   . Carotid stenosis, bilateral   . Chronic low back pain   . COPD (chronic obstructive pulmonary disease) ([place])   . Diabetes mellitus without complication ([place])    type II   . Diabetic neuropathy ([place])   . Diverticulitis   . Dyspnea    mild   . Frequent falls   . GERD (gastroesophageal reflux disease)   . History of bronchitis   . Hypercholesterolemia   . Hypertension   . IBS (irritable bowel syndrome)   . Osteoarthritis    knee  . Palpitations   . Sleep apnea    mild but no sleep apnea   . TMJ (dislocation of temporomandibular joint)   . 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 Vitamin D deficiency     Patient Active Problem List   Diagnosis Date Noted  . Adenomatous polyp of colon 12/04/2020  . Anal fissure 12/04/2020  . Chronic idiopathic constipation 12/04/2020  . Constipation 12/04/2020  . Dysphagia 12/04/2020  . Gastroesophageal reflux disease 12/04/2020  . Rectal pain 12/04/2020  . Right upper quadrant pain 12/04/2020  . Dizzy 12/11/2017  . Pharyngeal dysphagia 12/11/2017  . Post-nasal drip 12/11/2017  . Referred ear pain, bilateral 12/11/2017  . Diabetes mellitus without complication ([place]) [id number]  . Family history of glaucoma 06/29/2017   . Hyperopia of both eyes with astigmatism and presbyopia 06/29/2017  . Keratoconjunctivitis sicca of both eyes not specified as Sjogren's 06/29/2017  . Nuclear sclerotic cataract of both eyes 06/29/2017  . Vitreous floater, bilateral 06/29/2017  . Biceps tendinitis of right shoulder 04/05/2017  . Impingement syndrome of right shoulder 04/05/2017  . Arthritis of right acromioclavicular joint 04/05/2017  . Partial nontraumatic tear of rotator cuff, right 04/05/2017    Past Surgical History:  Procedure Laterality Date  . ABDOMINAL HYSTERECTOMY  1993  . CARPAL TUNNEL RELEASE Right   . CERVICAL DISC SURGERY  04/29/2015   Dr Keyanni Sis, [place] N/A 03/03/2021   Procedure: ESOPHAGEAL MANOMETRY (EM);  Surgeon: Wilford Corner, MD;  Location: [facility]COPY;  Service: Endoscopy;  Laterality: N/A;  . ESOPHAGOGASTRODUODENOSCOPY ENDOSCOPY     with esophagus being stretched twice per pt,   . EYE SURGERY    . 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 feeding tube placement and removal   2015  . NASAL SEPTUM SURGERY    . spurs removed from esophagus   2015  . TONSILLECTOMY      OB History   No obstetric history on file.      Home Medications    Prior to Admission medications   Medication Sig Start Date End Date Taking? Authorizing Provider  promethazine-dextromethorphan (PROMETHAZINE-DM) 6.25-15  MG/5ML syrup Take 2.5 mLs by mouth 4 (four) times daily as needed for cough. 04/06/21  Yes Hans Eden, NP  Acetaminophen-Codeine 300-30 MG tablet  12/19/20   [provider]  acyclovir (ZOVIRAX) 400 MG tablet Take 400 mg by mouth 2 (two) times daily.    [provider]  albuterol (PROAIR HFA) 108 (90 Base) MCG/ACT inhaler Inhale 1-2 puffs into the lungs every 6 (six) hours as needed for wheezing or shortness of breath. 06/14/20   Zigmund Gottron, NP  albuterol (PROVENTIL) (2.5 MG/3ML) 0.083% nebulizer solution Take 3 mLs (2.5 mg total) by  nebulization every 4 (four) hours as needed for wheezing or shortness of breath. 03/11/19   Raylene Everts, MD  Artificial Tear Ointment (DRY EYES OP) Place 1-2 drops into both eyes daily as needed (for dry eyes).     [provider]  Ascorbic Acid (VITAMIN C) 1000 MG tablet Take 1,000 mg by mouth daily.    [provider]  atorvastatin (LIPITOR) 10 MG tablet Take 10 mg by mouth daily.    [provider]  cholecalciferol (VITAMIN D3) 25 MCG (1000 UNIT) tablet Take 2,000 Units by mouth daily.     [provider]  cyclobenzaprine (FLEXERIL) 5 MG tablet Take 1 tablet (5 mg total) by mouth at bedtime as needed for muscle spasms. Patient not taking: No sig reported 12/04/20   Chase Picket, MD  cycloSPORINE (RESTASIS) 0.05 % ophthalmic emulsion Place 1 drop into both eyes 2 times daily. 02/02/21   [provider]  dicyclomine (BENTYL) 20 MG tablet Take 20 mg by mouth 3 (three) times daily. 10/17/19   [provider]  EPINEPHrine 0.1 MG/0.1ML SOAJ Inject as directed See admin instructions.    [provider]  fexofenadine (ALLEGRA) 180 MG tablet  12/24/20   [provider]  fluticasone (FLONASE) 50 MCG/ACT nasal spray Place 1 spray into both nostrils daily as needed for allergies or rhinitis.    [provider]  fluticasone-salmeterol (ADVAIR HFA) 115-21 MCG/ACT inhaler Inhale 2 puffs into the lungs 2 (two) times daily. 01/07/21   Kennith Gain, MD  gabapentin (NEURONTIN) 300 MG capsule Take 300 mg by mouth 3 (three) times daily.    [provider]  guaiFENesin (MUCINEX) 600 MG 12 hr tablet Take 2 tablets (1,200 mg total) by mouth 2 (two) times daily as needed. 01/06/21   Sharion Balloon, NP  HYDROcodone-Acetaminophen 10-325 MG/15ML SOLN Take by mouth.    [provider]  ibuprofen (ADVIL) 600 MG tablet Take 1 tablet (600 mg total) by mouth every 6 (six) hours as needed. 12/04/20   Chase Picket,  MD  ipratropium (ATROVENT) 0.06 % nasal spray Place 2 sprays into both nostrils in the morning and at bedtime. 01/07/21   Kennith Gain, MD  JANUMET XR 50-500 MG TB24 Take 1 tablet by mouth daily. 09/27/20   [provider]  ketorolac (ACULAR) 0.5 % ophthalmic solution One drop in operative eye(s) TID starting 2 days before surgery and continuing 3 weeks after surgery. 02/02/21   [provider]  metoprolol succinate (TOPROL-XL) 25 MG 24 hr tablet Take by mouth.    [provider]  metoprolol tartrate (LOPRESSOR) 25 MG tablet Take 25 mg by mouth 2 (two) times daily.    [provider]  montelukast (SINGULAIR) 10 MG tablet Take 10 mg by mouth daily.     [provider]  Multiple Vitamin (MULTIVITAMIN WITH MINERALS) TABS  tablet Take 1 tablet by mouth daily.    [provider]  ofloxacin (OCUFLOX) 0.3 % ophthalmic solution Instill 1 drop TID in operative eye(s) starting 2 days prior to surgery and after surgery for 3 weeks. 02/02/21   [provider]  ondansetron (ZOFRAN-ODT) 8 MG disintegrating tablet Take 1 tablet (8 mg total) by mouth every 8 (eight) hours as needed for nausea or vomiting. 08/28/20   Jaynee Eagles, PA-C  pantoprazole (PROTONIX) 40 MG tablet Take 40 mg by mouth 2 (two) times daily.  11/20/19   [provider]  PANTOPRAZOLE SODIUM PO Take by mouth.    [provider]  prednisoLONE acetate (PRED FORTE) 1 % ophthalmic suspension 1 drop into affected eye 02/02/21   [provider]  sucralfate (CARAFATE) 1 g tablet Take 1 g by mouth 4 (four) times daily.  02/11/20   [provider]  esomeprazole (NEXIUM) 40 MG capsule Take 1 capsule (40 mg total) by mouth daily. Patient not taking: Reported on 07/08/2020 04/24/19 08/06/20  Charlesetta Shanks, MD    Family History Family History  Problem Relation Age of Onset  . Allergic rhinitis Mother   . Diabetes Father        type 2  . Heart disease Father    . Hypertension Father   . Allergic rhinitis Father   . Heart disease Brother   . Hypertension Brother   . Allergic rhinitis Sister   . Angioedema Neg Hx   . Asthma Neg Hx   . Atopy Neg Hx   . Eczema Neg Hx   . Immunodeficiency Neg Hx   . Urticaria Neg Hx     Social History Social History   Tobacco Use  . Smoking status: Former Smoker    Packs/day: 0.50    Years: 30.00    Pack years: 15.00    Quit date: 12/08/1994    Years since quitting: 26.3  . Smokeless tobacco: Never Used  Vaping Use  . Vaping Use: Never used  Substance Use Topics  . Alcohol use: No  . Drug use: No     Allergies   Crab [shellfish allergy], Erythromycin, Hm lidocaine patch [lidocaine], Erdafitinib, Fish allergy, Metoclopramide, Rosanil cleanser [sulfacetamide sodium-sulfur], Sulfacetamide, Doxycycline, Levaquin [levofloxacin in d5w], Morphine, Morphine and related, and Sulfa antibiotics   Review of Systems Review of Systems  Defer to HPI   Physical Exam Triage Vital Signs ED Triage Vitals  Enc Vitals Group     BP 04/06/21 0818 112/76     Pulse Rate 04/06/21 0818 96     Resp 04/06/21 0818 17     Temp 04/06/21 0818 99.4 F (37.4 C)     Temp Source 04/06/21 0818 Oral     SpO2 04/06/21 0818 96 %     Weight --      Height --      Head Circumference --      Peak Flow --      Pain Score 04/06/21 0817 3     Pain Loc --      Pain Edu? --      Excl. in [place]? --    No data found.  Updated Vital Signs BP 112/76 (BP Location: Right Arm)   Pulse 96   Temp 99.4 F (37.4 C) (Oral)   Resp 17   SpO2 96%   Visual Acuity Right Eye Distance:   Left Eye Distance:   Bilateral Distance:    Right Eye Near:   Left Eye Near:  Bilateral Near:     Physical Exam Constitutional:      Appearance: Normal appearance. She is normal weight.  HENT:     Head: Normocephalic.     Right Ear: Tympanic membrane, ear canal and external ear normal.     Left Ear: Tympanic membrane, ear canal and external ear  normal.     Nose: Congestion and rhinorrhea present.     Mouth/Throat:     Mouth: Mucous membranes are moist.     Pharynx: Posterior oropharyngeal erythema present.  Eyes:     Extraocular Movements: Extraocular movements intact.     Conjunctiva/sclera: Conjunctivae normal.     Pupils: Pupils are equal, round, and reactive to light.  Cardiovascular:     Rate and Rhythm: Normal rate and regular rhythm.     Pulses: Normal pulses.     Heart sounds: Normal heart sounds.  Pulmonary:     Effort: Pulmonary effort is normal.     Breath sounds: Normal breath sounds.  Musculoskeletal:        General: Normal range of motion.     Cervical back: Normal range of motion.  Lymphadenopathy:     Cervical: Cervical adenopathy present.  Skin:    General: Skin is warm and dry.  Neurological:     General: No focal deficit present.     Mental Status: She is alert and oriented to person, place, and time. Mental status is at baseline.  Psychiatric:        Mood and Affect: Mood normal.        Behavior: Behavior normal.        Thought Content: Thought content normal.        Judgment: Judgment normal.      UC Treatments / Results  Labs (all labs ordered are listed, but only abnormal results are displayed) Labs Reviewed - No data to display  EKG   Radiology No results found.  Procedures Procedures (including critical care time)  Medications Ordered in UC Medications - No data to display  Initial Impression / Assessment and Plan / UC Course  I have reviewed the triage vital signs and the nursing notes.  Pertinent labs & imaging results that were available during my care of the patient were reviewed by me and considered in my medical decision making (see chart for details).   Viral URI with cough  1. Promethazine DM 2.5 mL every 4 hours as needed, declined Tessalon Perles  2.  Declined COVID and flu testing 3.  Advised to use of over-the-counter ibuprofen or Tylenol for treatment of body  aches 4.  Salt water gargles, throat lozenges, hot liquids, teaspoon of honey aid in sore throat Final Clinical Impressions(s) / UC Diagnoses   Final diagnoses:  Viral URI with cough     Discharge Instructions     Can use 2.5 to 5 mL of cough syrup every 4 hours as needed  Can continue use of nasal spray as prescribed to further help with congestion  Can use 400 mg of ibuprofen or 650 mg of Tylenol every 6 hours to help with chest soreness  Can use salt water gargles, hot liquids, throat lozenges or hard candies, teaspoon of honey for sore throat   ED Prescriptions    Medication Sig Dispense Auth. Provider   promethazine-dextromethorphan (PROMETHAZINE-DM) 6.25-15 MG/5ML syrup Take 2.5 mLs by mouth 4 (four) times daily as needed for cough. 118 mL Aziah Brostrom, Leitha Schuller, NP     PDMP not reviewed this encounter.  Hans Eden, [place] 04/06/21 [phone]

## 2021-04-15 ENCOUNTER — Ambulatory Visit
Admission: RE | Admit: 2021-04-15 | Discharge: 2021-04-15 | Disposition: A | Discharge status: Home / Self Care | Payer: Medicare HMO | Source: Ambulatory Visit | Attending: Family Medicine | Admitting: Family Medicine

## 2021-04-15 ENCOUNTER — Other Ambulatory Visit: Payer: Self-pay

## 2021-04-15 ENCOUNTER — Ambulatory Visit: Payer: Medicare HMO

## 2021-04-15 DIAGNOSIS — R928 Other abnormal and inconclusive findings on diagnostic imaging of breast: Secondary | ICD-10-CM

## 2021-04-19 ENCOUNTER — Emergency Department (HOSPITAL_COMMUNITY)
Admission: EM | Admit: 2021-04-19 | Discharge: 2021-04-19 | Disposition: A | Discharge status: Home / Self Care | Payer: Medicare HMO | Attending: Emergency Medicine | Admitting: Emergency Medicine

## 2021-04-19 ENCOUNTER — Other Ambulatory Visit: Payer: Self-pay

## 2021-04-19 ENCOUNTER — Encounter (HOSPITAL_COMMUNITY): Payer: Self-pay

## 2021-04-19 DIAGNOSIS — I10 Essential (primary) hypertension: Secondary | ICD-10-CM | POA: Diagnosis not present

## 2021-04-19 DIAGNOSIS — K069 Disorder of gingiva and edentulous alveolar ridge, unspecified: Secondary | ICD-10-CM | POA: Insufficient documentation

## 2021-04-19 DIAGNOSIS — E1140 Type 2 diabetes mellitus with diabetic neuropathy, unspecified: Secondary | ICD-10-CM | POA: Diagnosis not present

## 2021-04-19 DIAGNOSIS — M5416 Radiculopathy, lumbar region: Secondary | ICD-10-CM | POA: Insufficient documentation

## 2021-04-19 DIAGNOSIS — R0981 Nasal congestion: Secondary | ICD-10-CM | POA: Diagnosis not present

## 2021-04-19 DIAGNOSIS — K068 Other specified disorders of gingiva and edentulous alveolar ridge: Secondary | ICD-10-CM

## 2021-04-19 DIAGNOSIS — Z87891 Personal history of nicotine dependence: Secondary | ICD-10-CM | POA: Insufficient documentation

## 2021-04-19 DIAGNOSIS — Z7951 Long term (current) use of inhaled steroids: Secondary | ICD-10-CM | POA: Insufficient documentation

## 2021-04-19 DIAGNOSIS — Z7984 Long term (current) use of oral hypoglycemic drugs: Secondary | ICD-10-CM | POA: Insufficient documentation

## 2021-04-19 DIAGNOSIS — Z79899 Other long term (current) drug therapy: Secondary | ICD-10-CM | POA: Insufficient documentation

## 2021-04-19 DIAGNOSIS — M545 Low back pain, unspecified: Secondary | ICD-10-CM | POA: Diagnosis present

## 2021-04-19 DIAGNOSIS — J449 Chronic obstructive pulmonary disease, unspecified: Secondary | ICD-10-CM | POA: Insufficient documentation

## 2021-04-19 DIAGNOSIS — M5410 Radiculopathy, site unspecified: Secondary | ICD-10-CM

## 2021-04-19 MED ORDER — PREDNISONE 20 MG PO TABS: ORAL_TABLET | ORAL | 0 refills | Status: DC

## 2021-04-19 MED ORDER — IBUPROFEN 600 MG PO TABS: 600.0000 mg | ORAL_TABLET | Freq: Four times a day (QID) | ORAL | 0 refills | Status: DC | PRN

## 2021-04-19 MED ORDER — CYCLOBENZAPRINE HCL 10 MG PO TABS: 10.0000 mg | ORAL_TABLET | Freq: Two times a day (BID) | ORAL | 0 refills | Status: DC | PRN

## 2021-04-19 MED ORDER — AMOXICILLIN 500 MG PO CAPS: 500.0000 mg | ORAL_CAPSULE | Freq: Three times a day (TID) | ORAL | 0 refills | Status: DC

## 2021-04-19 NOTE — ED Provider Notes (Signed)
[facility] DEPT Provider Note   CSN: [id number] Arrival date & time: 04/19/21  1851     History Chief Complaint  Patient presents with  . Back Pain  . Nasal Congestion    Pamela Roberson is a 65 y.o. female.  The history is provided by the patient and medical records. No language interpreter was used.  Back Pain    65 year old female who presents with multiple complaints.  She mentioned that she has had gum discomfort for the past 3 to 4 days, hurts when she chews, felt that her mouth is swollen and feels as if she is having a gum infection.  She does endorse some mild congestion but that is not new.  She denies any associated fever chills productive cough or shortness of breath.  Furthermore, patient also endorsed having low back pain that radiates down to her left thigh.  This pain has been ongoing for nearly a week, worsening with certain position and with ambulation.  She felt that this is similar to her prior sciatica.  She does endorse some urinary incontinence but that has been ongoing for more than a year.  She denies any recent injury or new numbness.  She mention previously was treated with steroid and it did provide relief.  She does have history of diabetes  Past Medical History:  Diagnosis Date  . Arthritis   . Asthma   . Carotid stenosis, bilateral   . Chronic low back pain   . COPD (chronic obstructive pulmonary disease) ([place])   . Diabetes mellitus without complication ([place])    type II   . Diabetic neuropathy ([place])   . Diverticulitis   . Dyspnea    mild   . Frequent falls   . GERD (gastroesophageal reflux disease)   . History of bronchitis   . Hypercholesterolemia   . Hypertension   . IBS (irritable bowel syndrome)   . Osteoarthritis    knee  . Palpitations   . Sleep apnea    mild but no sleep apnea   . TMJ (dislocation of temporomandibular joint)   . 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 Vitamin D deficiency      Patient Active Problem List   Diagnosis Date Noted  . Adenomatous polyp of colon 12/04/2020  . Anal fissure 12/04/2020  . Chronic idiopathic constipation 12/04/2020  . Constipation 12/04/2020  . Dysphagia 12/04/2020  . Gastroesophageal reflux disease 12/04/2020  . Rectal pain 12/04/2020  . Right upper quadrant pain 12/04/2020  . Dizzy 12/11/2017  . Pharyngeal dysphagia 12/11/2017  . Post-nasal drip 12/11/2017  . Referred ear pain, bilateral 12/11/2017  . Diabetes mellitus without complication ([place]) 50/[id number]/9767  . Family history of glaucoma 06/29/2017  . Hyperopia of both eyes with astigmatism and presbyopia 06/29/2017  . Keratoconjunctivitis sicca of both eyes not specified as Sjogren's 06/29/2017  . Nuclear sclerotic cataract of both eyes 06/29/2017  . Vitreous floater, bilateral 06/29/2017  . Biceps tendinitis of right shoulder 04/05/2017  . Impingement syndrome of right shoulder 04/05/2017  . Arthritis of right acromioclavicular joint 04/05/2017  . Partial nontraumatic tear of rotator cuff, right 04/05/2017    Past Surgical History:  Procedure Laterality Date  . ABDOMINAL HYSTERECTOMY  1993  . CARPAL TUNNEL RELEASE Right   . CERVICAL DISC SURGERY  04/29/2015   Dr Margarie Sis, [place] N/A 03/03/2021   Procedure: ESOPHAGEAL MANOMETRY (EM);  Surgeon: Wilford Corner, MD;  Location: [facility]  ENDOSCOPY;  Service: Endoscopy;  Laterality: N/A;  . ESOPHAGOGASTRODUODENOSCOPY ENDOSCOPY     with esophagus being stretched twice per pt,   . EYE SURGERY    . 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 feeding tube placement and removal   2015  . NASAL SEPTUM SURGERY    . spurs removed from esophagus   2015  . TONSILLECTOMY       OB History   No obstetric history on file.     Family History  Problem Relation Age of Onset  . Allergic rhinitis Mother   . Diabetes Father        type 2  . Heart disease Father   . Hypertension Father   . Allergic rhinitis  Father   . Heart disease Brother   . Hypertension Brother   . Allergic rhinitis Sister   . Angioedema Neg Hx   . Asthma Neg Hx   . Atopy Neg Hx   . Eczema Neg Hx   . Immunodeficiency Neg Hx   . Urticaria Neg Hx     Social History   Tobacco Use  . Smoking status: Former Smoker    Packs/day: 0.50    Years: 30.00    Pack years: 15.00    Quit date: 12/08/1994    Years since quitting: 26.3  . Smokeless tobacco: Never Used  Vaping Use  . Vaping Use: Never used  Substance Use Topics  . Alcohol use: No  . Drug use: No    Home Medications Prior to Admission medications   Medication Sig Start Date End Date Taking? Authorizing Provider  Acetaminophen-Codeine 300-30 MG tablet  12/19/20   [provider]  acyclovir (ZOVIRAX) 400 MG tablet Take 400 mg by mouth 2 (two) times daily.    [provider]  albuterol (PROAIR HFA) 108 (90 Base) MCG/ACT inhaler Inhale 1-2 puffs into the lungs every 6 (six) hours as needed for wheezing or shortness of breath. 06/14/20   Zigmund Gottron, NP  albuterol (PROVENTIL) (2.5 MG/3ML) 0.083% nebulizer solution Take 3 mLs (2.5 mg total) by nebulization every 4 (four) hours as needed for wheezing or shortness of breath. 03/11/19   Raylene Everts, MD  Artificial Tear Ointment (DRY EYES OP) Place 1-2 drops into both eyes daily as needed (for dry eyes).     [provider]  Ascorbic Acid (VITAMIN C) 1000 MG tablet Take 1,000 mg by mouth daily.    [provider]  atorvastatin (LIPITOR) 10 MG tablet Take 10 mg by mouth daily.    [provider]  cholecalciferol (VITAMIN D3) 25 MCG (1000 UNIT) tablet Take 2,000 Units by mouth daily.     [provider]  cyclobenzaprine (FLEXERIL) 5 MG tablet Take 1 tablet (5 mg total) by mouth at bedtime as needed for muscle spasms. Patient not taking: No sig reported 12/04/20   Chase Picket, MD  cycloSPORINE (RESTASIS) 0.05 % ophthalmic emulsion Place 1 drop into both eyes  2 times daily. 02/02/21   [provider]  dicyclomine (BENTYL) 20 MG tablet Take 20 mg by mouth 3 (three) times daily. 10/17/19   [provider]  EPINEPHrine 0.1 MG/0.1ML SOAJ Inject as directed See admin instructions.    [provider]  fexofenadine (ALLEGRA) 180 MG tablet  12/24/20   [provider]  fluticasone (FLONASE) 50 MCG/ACT nasal spray Place 1 spray into both nostrils daily as needed for allergies or rhinitis.    [provider]  fluticasone-salmeterol (ADVAIR HFA) 115-21 MCG/ACT inhaler Inhale 2 puffs into the lungs 2 (two) times daily. 01/07/21   Kennith Gain, MD  gabapentin (NEURONTIN) 300 MG capsule Take 300 mg by mouth 3 (three) times daily.    [provider]  guaiFENesin (MUCINEX) 600 MG 12 hr tablet Take 2 tablets (1,200 mg total) by mouth 2 (two) times daily as needed. 01/06/21   Sharion Balloon, NP  HYDROcodone-Acetaminophen 10-325 MG/15ML SOLN Take by mouth.    [provider]  ibuprofen (ADVIL) 600 MG tablet Take 1 tablet (600 mg total) by mouth every 6 (six) hours as needed. 12/04/20   Chase Picket, MD  ipratropium (ATROVENT) 0.06 % nasal spray Place 2 sprays into both nostrils in the morning and at bedtime. 01/07/21   Kennith Gain, MD  JANUMET XR 50-500 MG TB24 Take 1 tablet by mouth daily. 09/27/20   [provider]  ketorolac (ACULAR) 0.5 % ophthalmic solution One drop in operative eye(s) TID starting 2 days before surgery and continuing 3 weeks after surgery. 02/02/21   [provider]  metoprolol succinate (TOPROL-XL) 25 MG 24 hr tablet Take by mouth.    [provider]  metoprolol tartrate (LOPRESSOR) 25 MG tablet Take 25 mg by mouth 2 (two) times daily.    [provider]  montelukast (SINGULAIR) 10 MG tablet Take 10 mg by mouth daily.     [provider]  Multiple Vitamin (MULTIVITAMIN WITH MINERALS) TABS tablet Take 1 tablet by mouth daily.     [provider]  ofloxacin (OCUFLOX) 0.3 % ophthalmic solution Instill 1 drop TID in operative eye(s) starting 2 days prior to surgery and after surgery for 3 weeks. 02/02/21   [provider]  ondansetron (ZOFRAN-ODT) 8 MG disintegrating tablet Take 1 tablet (8 mg total) by mouth every 8 (eight) hours as needed for nausea or vomiting. 08/28/20   Jaynee Eagles, PA-C  pantoprazole (PROTONIX) 40 MG tablet Take 40 mg by mouth 2 (two) times daily.  11/20/19   [provider]  PANTOPRAZOLE SODIUM PO Take by mouth.    [provider]  prednisoLONE acetate (PRED FORTE) 1 % ophthalmic suspension 1 drop into affected eye 02/02/21   [provider]  promethazine-dextromethorphan (PROMETHAZINE-DM) 6.25-15 MG/5ML syrup Take 2.5 mLs by mouth 4 (four) times daily as needed for cough. 04/06/21   White, Leitha Schuller, NP  sucralfate (CARAFATE) 1 g tablet Take 1 g by mouth 4 (four) times daily.  02/11/20   [provider]  esomeprazole (NEXIUM) 40 MG capsule Take 1 capsule (40 mg total) by mouth daily. Patient not taking: Reported on 07/08/2020 04/24/19 08/06/20  Charlesetta Shanks, MD    Allergies    Otho Darner allergy], Erythromycin, Hm lidocaine patch [lidocaine], Erdafitinib, Fish allergy, Metoclopramide, Rosanil cleanser [sulfacetamide sodium-sulfur], Sulfacetamide, Doxycycline, Levaquin [levofloxacin in d5w], Morphine, Morphine and related, and Sulfa antibiotics  Review of Systems   Review of Systems  Musculoskeletal: Positive for back pain.  All other systems reviewed and are negative.   Physical Exam Updated Vital Signs BP (!) 142/99   Pulse 79   Temp 99.2 F (37.3 C) (Oral)   Resp 18   Ht 5\' 6"  (1.676 m)   Wt 88.5 kg   SpO2 100%   BMI 31.47 kg/m   Physical Exam Vitals and nursing note reviewed.  Constitutional:      General: She is not in acute distress.    Appearance: She is well-developed.  HENT:     Head: Atraumatic.     Nose: Nose normal.      Mouth/Throat:     Mouth: Mucous membranes are moist.     Comments: Mouth: Patient is edentulous, tenderness along lower gumline without obvious erythema or abscess appreciated.  Normal mucosa, normal posterior oropharyngeal region. Eyes:     Conjunctiva/sclera: Conjunctivae normal.  Musculoskeletal:        General: Tenderness (Tenderness to lumbar and left lumbosacral region with negative straight leg raise) present.     Cervical back: Neck supple.  Skin:    Findings: No rash.  Neurological:     Mental Status: She is alert. Mental status is at baseline.     ED Results / Procedures / Treatments   Labs (all labs ordered are listed, but only abnormal results are displayed) Labs Reviewed - No data to display  EKG None  Radiology No results found.  Procedures Procedures   Medications Ordered in ED Medications - No data to display  ED Course  I have reviewed the triage vital signs and the nursing notes.  Pertinent labs & imaging results that were available during my care of the patient were reviewed by me and considered in my medical decision making (see chart for details).    MDM Rules/Calculators/A&P                          BP (!) 142/99   Pulse 79   Temp 99.2 F (37.3 C) (Oral)   Resp 18   Ht 5\' 6"  (1.676 m)   Wt 88.5 kg   SpO2 100%   BMI 31.47 kg/m   Final Clinical Impression(s) / ED Diagnoses Final diagnoses:  Pain in gums  Radicular low back pain    Rx / DC Orders ED Discharge Orders    None     8:40 PM Patient complaining of pain in her gum.  She is edentulous.  She does have some tenderness to her lower gumline.  No evidence of thrush.  We will give antibiotic for potential gingivitis.  She also has radicular lower back pain.  She does not have any red flags.  She is a diabetic therefore steroid can potentially increase her hyperglycemic state.  We will provide a short course of steroid, muscle relaxant as well as anti-inflammatory medication.   Return precaution given   Doy Hutching 04/19/21 2043    Lacretia Leigh, MD 04/21/21 [phone]

## 2021-04-19 NOTE — ED Triage Notes (Addendum)
Pt c/o L lower back pain radiating down L leg. States she has been told before that she has sciatica. Denies falls/injury. Pt also c/o congestion and sinus pressure/pain

## 2021-05-04 ENCOUNTER — Other Ambulatory Visit: Payer: Self-pay

## 2021-05-04 ENCOUNTER — Emergency Department (HOSPITAL_COMMUNITY)
Admission: EM | Admit: 2021-05-04 | Discharge: 2021-05-04 | Disposition: A | Discharge status: Home / Self Care | Payer: Medicare HMO | Attending: Emergency Medicine | Admitting: Emergency Medicine

## 2021-05-04 ENCOUNTER — Encounter (HOSPITAL_COMMUNITY): Payer: Self-pay

## 2021-05-04 ENCOUNTER — Ambulatory Visit (HOSPITAL_COMMUNITY)
Admission: EM | Admit: 2021-05-04 | Discharge: 2021-05-04 | Disposition: A | Discharge status: Home / Self Care | Payer: Medicare HMO

## 2021-05-04 ENCOUNTER — Emergency Department (HOSPITAL_COMMUNITY): Payer: Medicare HMO

## 2021-05-04 DIAGNOSIS — E1140 Type 2 diabetes mellitus with diabetic neuropathy, unspecified: Secondary | ICD-10-CM | POA: Diagnosis not present

## 2021-05-04 DIAGNOSIS — Y92512 Supermarket, store or market as the place of occurrence of the external cause: Secondary | ICD-10-CM | POA: Diagnosis not present

## 2021-05-04 DIAGNOSIS — J449 Chronic obstructive pulmonary disease, unspecified: Secondary | ICD-10-CM | POA: Insufficient documentation

## 2021-05-04 DIAGNOSIS — I10 Essential (primary) hypertension: Secondary | ICD-10-CM | POA: Insufficient documentation

## 2021-05-04 DIAGNOSIS — W208XXA Other cause of strike by thrown, projected or falling object, initial encounter: Secondary | ICD-10-CM | POA: Insufficient documentation

## 2021-05-04 DIAGNOSIS — Z7952 Long term (current) use of systemic steroids: Secondary | ICD-10-CM | POA: Insufficient documentation

## 2021-05-04 DIAGNOSIS — Z87891 Personal history of nicotine dependence: Secondary | ICD-10-CM | POA: Diagnosis not present

## 2021-05-04 DIAGNOSIS — S060X0A Concussion without loss of consciousness, initial encounter: Secondary | ICD-10-CM | POA: Insufficient documentation

## 2021-05-04 DIAGNOSIS — Z7984 Long term (current) use of oral hypoglycemic drugs: Secondary | ICD-10-CM | POA: Insufficient documentation

## 2021-05-04 DIAGNOSIS — J45909 Unspecified asthma, uncomplicated: Secondary | ICD-10-CM | POA: Diagnosis not present

## 2021-05-04 DIAGNOSIS — S0990XA Unspecified injury of head, initial encounter: Secondary | ICD-10-CM | POA: Diagnosis present

## 2021-05-04 DIAGNOSIS — Z79899 Other long term (current) drug therapy: Secondary | ICD-10-CM | POA: Insufficient documentation

## 2021-05-04 NOTE — ED Provider Notes (Signed)
Emergency Medicine Provider Triage Evaluation Note  Pamela Roberson , a 65 y.o. female  was evaluated in triage.  Pt complains of sided facial pain and headache.  States that she was reaching for a can of beans at [facility] when she was hit in the face.  Denies any loss of consciousness, vision changes or anticoagulant use..  Review of Systems  Positive: Headache, facial pain Negative: Vision changes, loss of consciousness  Physical Exam  BP (!) 142/82 (BP Location: Left Arm)   Pulse 83   Temp 98.4 F (36.9 C) (Oral)   Resp 17   Ht 5\' 6"  (1.676 m)   Wt 86.2 kg   SpO2 100%   BMI 30.67 kg/m  Gen:   Awake, no distress   Resp:  Normal effort  MSK:   Moves extremities without difficulty  Other:  No facial asymmetry.  Tenderness palpation of the right cheek area.  Strength 5/5 in bilateral upper and lower extremities.  Pupils are reactive  Medical Decision Making  Medically screening exam initiated at 9:12 PM.  Appropriate orders placed.  Pamela Roberson was informed that the remainder of the evaluation will be completed by another provider, this initial triage assessment does not replace that evaluation, and the importance of remaining in the ED until their evaluation is complete.  Will order imaging   Delia Heady, PA-C 05/04/21 2113    Wyvonnia Dusky, MD 05/05/21 1106

## 2021-05-04 NOTE — ED Triage Notes (Signed)
Pt sent by Urgent Care via POV. Pt has a headache, dizziness, and an injury to her head. Pt states she had cans fall on her head and that she has pain in her face.

## 2021-05-04 NOTE — Discharge Instructions (Signed)
The scans of your brain and your face did not show any broken bones or sign of brain bleeding.  We talked about the likelihood that you have a concussion.  I included some information to read about.  Most people recover from a concussion in 30 days.  If you feel confused, foggy, dizzy, lightheaded, please rest at home, do not drive, and drink lots of water.  You can take over-the-counter pain medications like Tylenol and Motrin as needed for headache.  You can apply ice packs or frozen peas to your right cheek 10 minutes at a time.

## 2021-05-04 NOTE — ED Triage Notes (Signed)
Patient is being discharged from the Urgent Care and sent to the Emergency Department via POV . Per Nelwyn Salisbury PA , patient is in need of higher level of care due to headache, dizziness and head injury. Patient is aware and verbalizes understanding of plan of care.  Vitals:   05/04/21 1918  BP: 128/71  Pulse: 77  Resp: 17  Temp: 98.8 F (37.1 C)  SpO2: 100%

## 2021-05-04 NOTE — ED Triage Notes (Signed)
Pt c/o dizziness and a headache x 2 days. Pt states she was reaching for a cans of food at [employer] yesterday and states they all fell on top of her head and face. She states she is having facial pain.

## 2021-05-04 NOTE — ED Provider Notes (Signed)
[facility] DEPT Provider Note   CSN: [id number] Arrival date & time: 05/04/21  2030     History Chief Complaint  Patient presents with  . Head Injury    Pamela Roberson is a 65 y.o. female presenting to Emergency Department with an injury to the head.  She was grocery shopping today and reaching up for a top shelf and they have a can fell off and struck her on the right cheek.  She felt dazed afterwards.  She reports she has had a developing headache in the front of her face since then.  She reports soreness on her right cheek.  She denies neck pain initially, but now has some soreness on the left side of her neck.  She denies loss of consciousness.  She is not on blood thinners.  HPI     Past Medical History:  Diagnosis Date  . Arthritis   . Asthma   . Carotid stenosis, bilateral   . Chronic low back pain   . COPD (chronic obstructive pulmonary disease) ([place])   . Diabetes mellitus without complication ([place])    type II   . Diabetic neuropathy ([place])   . Diverticulitis   . Dyspnea    mild   . Frequent falls   . GERD (gastroesophageal reflux disease)   . History of bronchitis   . Hypercholesterolemia   . Hypertension   . IBS (irritable bowel syndrome)   . Osteoarthritis    knee  . Palpitations   . Sleep apnea    mild but no sleep apnea   . TMJ (dislocation of temporomandibular joint)   . 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 Vitamin D deficiency     Patient Active Problem List   Diagnosis Date Noted  . Adenomatous polyp of colon 12/04/2020  . Anal fissure 12/04/2020  . Chronic idiopathic constipation 12/04/2020  . Constipation 12/04/2020  . Dysphagia 12/04/2020  . Gastroesophageal reflux disease 12/04/2020  . Rectal pain 12/04/2020  . Right upper quadrant pain 12/04/2020  . Dizzy 12/11/2017  . Pharyngeal dysphagia 12/11/2017  . Post-nasal drip 12/11/2017  . Referred ear pain, bilateral 12/11/2017  . Diabetes  mellitus without complication ([place]) 53/61/4431  . Family history of glaucoma 06/29/2017  . Hyperopia of both eyes with astigmatism and presbyopia 06/29/2017  . Keratoconjunctivitis sicca of both eyes not specified as Sjogren's 06/29/2017  . Nuclear sclerotic cataract of both eyes 06/29/2017  . Vitreous floater, bilateral 06/29/2017  . Biceps tendinitis of right shoulder 04/05/2017  . Impingement syndrome of right shoulder 04/05/2017  . Arthritis of right acromioclavicular joint 04/05/2017  . Partial nontraumatic tear of rotator cuff, right 04/05/2017    Past Surgical History:  Procedure Laterality Date  . ABDOMINAL HYSTERECTOMY  1993  . CARPAL TUNNEL RELEASE Right   . CERVICAL DISC SURGERY  04/29/2015   Dr [place] Sis, [place] N/A 03/03/2021   Procedure: ESOPHAGEAL MANOMETRY (EM);  Surgeon: Wilford Corner, MD;  Location: [facility] ENDOSCOPY;  Service: Endoscopy;  Laterality: N/A;  . ESOPHAGOGASTRODUODENOSCOPY ENDOSCOPY     with esophagus being stretched twice per pt,   . EYE SURGERY    . 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 feeding tube placement and removal   2015  . NASAL SEPTUM SURGERY    . spurs removed from esophagus   2015  . TONSILLECTOMY       OB History   No obstetric  history on file.     Family History  Problem Relation Age of Onset  . Allergic rhinitis Mother   . Diabetes Father        type 2  . Heart disease Father   . Hypertension Father   . Allergic rhinitis Father   . Heart disease Brother   . Hypertension Brother   . Allergic rhinitis Sister   . Angioedema Neg Hx   . Asthma Neg Hx   . Atopy Neg Hx   . Eczema Neg Hx   . Immunodeficiency Neg Hx   . Urticaria Neg Hx     Social History   Tobacco Use  . Smoking status: Former Smoker    Packs/day: 0.50    Years: 30.00    Pack years: 15.00    Quit date: 12/08/1994    Years since quitting: 26.4  . Smokeless tobacco: Never Used  Vaping Use  . Vaping Use: Never used   Substance Use Topics  . Alcohol use: No  . Drug use: No    Home Medications Prior to Admission medications   Medication Sig Start Date End Date Taking? Authorizing Provider  Acetaminophen-Codeine 300-30 MG tablet  12/19/20   [provider]  acyclovir (ZOVIRAX) 400 MG tablet Take 400 mg by mouth 2 (two) times daily.    [provider]  albuterol (PROAIR HFA) 108 (90 Base) MCG/ACT inhaler Inhale 1-2 puffs into the lungs every 6 (six) hours as needed for wheezing or shortness of breath. 06/14/20   Zigmund Gottron, NP  albuterol (PROVENTIL) (2.5 MG/3ML) 0.083% nebulizer solution Take 3 mLs (2.5 mg total) by nebulization every 4 (four) hours as needed for wheezing or shortness of breath. 03/11/19   Raylene Everts, MD  amoxicillin (AMOXIL) 500 MG capsule Take 1 capsule (500 mg total) by mouth 3 (three) times daily. 04/19/21   Domenic Moras, PA-C  Artificial Tear Ointment (DRY EYES OP) Place 1-2 drops into both eyes daily as needed (for dry eyes).     [provider]  Ascorbic Acid (VITAMIN C) 1000 MG tablet Take 1,000 mg by mouth daily.    [provider]  atorvastatin (LIPITOR) 10 MG tablet Take 10 mg by mouth daily.    [provider]  cholecalciferol (VITAMIN D3) 25 MCG (1000 UNIT) tablet Take 2,000 Units by mouth daily.     [provider]  cyclobenzaprine (FLEXERIL) 10 MG tablet Take 1 tablet (10 mg total) by mouth 2 (two) times daily as needed for muscle spasms. 04/19/21   Domenic Moras, PA-C  cycloSPORINE (RESTASIS) 0.05 % ophthalmic emulsion Place 1 drop into both eyes 2 times daily. 02/02/21   [provider]  dicyclomine (BENTYL) 20 MG tablet Take 20 mg by mouth 3 (three) times daily. 10/17/19   [provider]  EPINEPHrine 0.1 MG/0.1ML SOAJ Inject as directed See admin instructions.    [provider]  fexofenadine (ALLEGRA) 180 MG tablet  12/24/20   [provider]  fluticasone (FLONASE) 50 MCG/ACT nasal  spray Place 1 spray into both nostrils daily as needed for allergies or rhinitis.    [provider]  fluticasone-salmeterol (ADVAIR HFA) 115-21 MCG/ACT inhaler Inhale 2 puffs into the lungs 2 (two) times daily. 01/07/21   Kennith Gain, MD  gabapentin (NEURONTIN) 300 MG capsule Take 300 mg by mouth 3 (three) times daily.    [provider]  guaiFENesin (MUCINEX) 600 MG 12 hr tablet Take 2 tablets (1,200 mg total) by mouth  2 (two) times daily as needed. 01/06/21   Sharion Balloon, NP  HYDROcodone-Acetaminophen 10-325 MG/15ML SOLN Take by mouth.    [provider]  ibuprofen (ADVIL) 600 MG tablet Take 1 tablet (600 mg total) by mouth every 6 (six) hours as needed for moderate pain. 04/19/21   Domenic Moras, PA-C  ipratropium (ATROVENT) 0.06 % nasal spray Place 2 sprays into both nostrils in the morning and at bedtime. 01/07/21   Kennith Gain, MD  JANUMET XR 50-500 MG TB24 Take 1 tablet by mouth daily. 09/27/20   [provider]  ketorolac (ACULAR) 0.5 % ophthalmic solution One drop in operative eye(s) TID starting 2 days before surgery and continuing 3 weeks after surgery. 02/02/21   [provider]  metoprolol succinate (TOPROL-XL) 25 MG 24 hr tablet Take by mouth.    [provider]  metoprolol tartrate (LOPRESSOR) 25 MG tablet Take 25 mg by mouth 2 (two) times daily.    [provider]  montelukast (SINGULAIR) 10 MG tablet Take 10 mg by mouth daily.     [provider]  Multiple Vitamin (MULTIVITAMIN WITH MINERALS) TABS tablet Take 1 tablet by mouth daily.    [provider]  ofloxacin (OCUFLOX) 0.3 % ophthalmic solution Instill 1 drop TID in operative eye(s) starting 2 days prior to surgery and after surgery for 3 weeks. 02/02/21   [provider]  ondansetron (ZOFRAN-ODT) 8 MG disintegrating tablet Take 1 tablet (8 mg total) by mouth every 8 (eight) hours as needed for nausea or vomiting. 08/28/20    Jaynee Eagles, PA-C  pantoprazole (PROTONIX) 40 MG tablet Take 40 mg by mouth 2 (two) times daily.  11/20/19   [provider]  PANTOPRAZOLE SODIUM PO Take by mouth.    [provider]  prednisoLONE acetate (PRED FORTE) 1 % ophthalmic suspension 1 drop into affected eye 02/02/21   [provider]  predniSONE (DELTASONE) 20 MG tablet 3 tabs po day one, then 2 po daily x 4 days 04/19/21   Domenic Moras, PA-C  promethazine-dextromethorphan (PROMETHAZINE-DM) 6.25-15 MG/5ML syrup Take 2.5 mLs by mouth 4 (four) times daily as needed for cough. 04/06/21   White, Leitha Schuller, NP  sucralfate (CARAFATE) 1 g tablet Take 1 g by mouth 4 (four) times daily.  02/11/20   [provider]  esomeprazole (NEXIUM) 40 MG capsule Take 1 capsule (40 mg total) by mouth daily. Patient not taking: Reported on 07/08/2020 04/24/19 08/06/20  Charlesetta Shanks, MD    Allergies    Otho Darner allergy], Erythromycin, Hm lidocaine patch [lidocaine], Erdafitinib, Fish allergy, Metoclopramide, Rosanil cleanser [sulfacetamide sodium-sulfur], Sulfacetamide, Doxycycline, Levaquin [levofloxacin in d5w], Morphine, Morphine and related, and Sulfa antibiotics  Review of Systems   Review of Systems  Eyes: Negative for pain and visual disturbance.  Respiratory: Negative for cough and shortness of breath.   Cardiovascular: Negative for chest pain and palpitations.  Gastrointestinal: Negative for abdominal pain, nausea and vomiting.  Musculoskeletal: Negative for arthralgias and back pain.  Skin: Positive for rash and wound.  Neurological: Positive for light-headedness and headaches. Negative for syncope.  All other systems reviewed and are negative.   Physical Exam Updated Vital Signs BP (!) 142/82 (BP Location: Left Arm)   Pulse 83   Temp 98.4 F (36.9 C) (Oral)   Resp 17   Ht 5\' 6"  (1.676 m)   Wt 86.2 kg   SpO2 100%   BMI 30.67 kg/m   Physical Exam Constitutional:  General: She is not in acute  distress. HENT:     Head: Normocephalic.     Comments: TTP of right maxilla, mild contusion  Eyes:     Extraocular Movements: Extraocular movements intact.     Conjunctiva/sclera: Conjunctivae normal.     Pupils: Pupils are equal, round, and reactive to light.  Neck:     Comments: No spinal tenderness midline Cardiovascular:     Rate and Rhythm: Normal rate and regular rhythm.  Pulmonary:     Effort: Pulmonary effort is normal. No respiratory distress.  Abdominal:     General: There is no distension.     Tenderness: There is no abdominal tenderness.  Skin:    General: Skin is warm and dry.  Neurological:     General: No focal deficit present.     Mental Status: She is alert. Mental status is at baseline.     ED Results / Procedures / Treatments   Labs (all labs ordered are listed, but only abnormal results are displayed) Labs Reviewed - No data to display  EKG None  Radiology CT Head Wo Contrast  Result Date: 05/04/2021 CLINICAL DATA:  Hit in face with object. EXAM: CT HEAD WITHOUT CONTRAST TECHNIQUE: Contiguous axial images were obtained from the base of the skull through the vertex without intravenous contrast. COMPARISON:  None. FINDINGS: Brain: No acute intracranial abnormality. Specifically, no hemorrhage, hydrocephalus, mass lesion, acute infarction, or significant intracranial injury. Vascular: No hyperdense vessel or unexpected calcification. Skull: No acute calvarial abnormality. Sinuses/Orbits: No acute findings Other: None IMPRESSION: No acute intracranial abnormality. Electronically Signed   By: Rolm Baptise M.D.   On: 05/04/2021 21:31   CT Maxillofacial Wo Contrast  Result Date: 05/04/2021 CLINICAL DATA:  Hit in face with object EXAM: CT MAXILLOFACIAL WITHOUT CONTRAST TECHNIQUE: Multidetector CT imaging of the maxillofacial structures was performed. Multiplanar CT image reconstructions were also generated. COMPARISON:  None. FINDINGS: Osseous: No fracture or  mandibular dislocation. No destructive process. Orbits: Negative. No traumatic or inflammatory finding. Sinuses: Clear Soft tissues: Negative Limited intracranial: No acute findings IMPRESSION: No facial or orbital fracture. Electronically Signed   By: Rolm Baptise M.D.   On: 05/04/2021 21:30    Procedures Procedures   Medications Ordered in ED Medications - No data to display  ED Course  I have reviewed the triage vital signs and the nursing notes.  Pertinent labs & imaging results that were available during my care of the patient were reviewed by me and considered in my medical decision making (see chart for details).  65 year old female here with head injury while at a grocery store today.  CT scan of the brain was unremarkable, CT scan of the face showed no acute fracture.  She has a contusion below the right orbit.  No evidence of ocular entrapment.  Suspect she may have a mild concussion as well from her head injury.  She will call her daughter to come pick her up.  Advised that she not drive and she rest for the neck several days at home.  I have a lower suspicion otherwise for brain bleed, spinal fracture, or other emergent medical condition at this time.    Final Clinical Impression(s) / ED Diagnoses Final diagnoses:  Concussion without loss of consciousness, initial encounter    Rx / DC Orders ED Discharge Orders    None       Wyvonnia Dusky, MD 05/04/21 2322

## 2021-05-17 ENCOUNTER — Telehealth: Payer: Self-pay | Admitting: Diagnostic Neuroimaging

## 2021-05-17 DIAGNOSIS — G8929 Other chronic pain: Secondary | ICD-10-CM

## 2021-05-17 NOTE — Telephone Encounter (Signed)
Per Dr Leta Baptist verbal order MRI lumbar spine ordered. Called patient ,no answer, voice MB not set up.

## 2021-05-17 NOTE — Telephone Encounter (Signed)
Called patient who stated she continues to have lower back pain,  pain goes down both legs into feet,  feels like a knife in left thigh, with  weakness in legs and feet. She never got MRI lumbar spine, stated due to Covid pandemic. I advised will discuss with Dr Leta Baptist and let her know recommendations. Patient verbalized understanding, appreciation.

## 2021-05-17 NOTE — Telephone Encounter (Signed)
Pt ask to be switched to a female neurologist. Did not have a specific neurologist would like to be transferred to at [facility]. Having lower back pain and weakness in legs. Would like a call from the nurse.

## 2021-05-18 NOTE — Telephone Encounter (Signed)
Called patient and informed her Dr Leta Baptist reordered MRI lumbar. She stated she'll need medication to help with MRI. I informed her she must have a driver; she stated her daughter will drive her. I advised Dr Leta Baptist is out of office until next Mon, will get Rx sent to pharmacy for her then. Advised she'll get a call in several days to schedule MRI.  Patient verbalized understanding, appreciation.

## 2021-05-18 NOTE — Addendum Note (Signed)
Addended by: Minna Antis on: 05/18/2021 08:15 AM   Modules accepted: Orders

## 2021-05-19 NOTE — Telephone Encounter (Signed)
[employer] approved MRI. PA #[id number] (05/19/21- 06/18/21). Sent to [facility]. They will call patient to schedule. Phone: [phone].

## 2021-05-21 ENCOUNTER — Encounter (HOSPITAL_COMMUNITY): Payer: Self-pay

## 2021-05-21 ENCOUNTER — Other Ambulatory Visit: Payer: Self-pay

## 2021-05-21 ENCOUNTER — Ambulatory Visit (INDEPENDENT_AMBULATORY_CARE_PROVIDER_SITE_OTHER): Payer: Medicare HMO

## 2021-05-21 ENCOUNTER — Ambulatory Visit (HOSPITAL_COMMUNITY)
Admission: RE | Admit: 2021-05-21 | Discharge: 2021-05-21 | Disposition: A | Discharge status: Home / Self Care | Payer: Medicare HMO | Source: Ambulatory Visit | Attending: Medical Oncology | Admitting: Medical Oncology

## 2021-05-21 VITALS — BP 143/77 | HR 108 | Temp 99.4°F | Resp 16

## 2021-05-21 DIAGNOSIS — J441 Chronic obstructive pulmonary disease with (acute) exacerbation: Secondary | ICD-10-CM

## 2021-05-21 DIAGNOSIS — J069 Acute upper respiratory infection, unspecified: Secondary | ICD-10-CM

## 2021-05-21 DIAGNOSIS — R059 Cough, unspecified: Secondary | ICD-10-CM

## 2021-05-21 MED ORDER — AMOXICILLIN-POT CLAVULANATE 875-125 MG PO TABS: 1.0000 | ORAL_TABLET | Freq: Two times a day (BID) | ORAL | 0 refills | Status: DC

## 2021-05-21 NOTE — ED Provider Notes (Signed)
[place]    CSN: [id number] Arrival date & time: 05/21/21  0947      History   Chief Complaint Chief Complaint  Patient presents with   Cough   Sore Throat    HPI DAYANIRA GIOVANNETTI is a 65 y.o. female.   HPI  Cold Symptoms: Patient reports that she has had a productive cough and sore throat for the past 2 weeks.  In addition she has had chills.  She has been taking over-the-counter cough medications without much relief.  She does have a history of asthma and COPD and has been using her normal Advair. More productive green sputum than normal. She denies wheezing, hemoptysis, significant SOB or chest pains. She is not on chronic oral steroids per patient.   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    Vitamin D deficiency     Patient Active Problem List   Diagnosis Date Noted   Adenomatous polyp of colon 12/04/2020   Anal fissure 12/04/2020   Chronic idiopathic constipation 12/04/2020   Constipation 12/04/2020   Dysphagia 12/04/2020   Gastroesophageal reflux disease 12/04/2020   Rectal pain 12/04/2020   Right upper quadrant pain 12/04/2020   Dizzy 12/11/2017   Pharyngeal dysphagia 12/11/2017   Post-nasal drip 12/11/2017   Referred ear pain, bilateral 12/11/2017   Diabetes mellitus without complication ([place]) 78/29/5621   Family history of glaucoma 06/29/2017    Hyperopia of both eyes with astigmatism and presbyopia 06/29/2017   Keratoconjunctivitis sicca of both eyes not specified as Sjogren's 06/29/2017   Nuclear sclerotic cataract of both eyes 06/29/2017   Vitreous floater, bilateral 06/29/2017   Biceps tendinitis of right shoulder 04/05/2017   Impingement syndrome of right shoulder 04/05/2017   Arthritis of right acromioclavicular joint 04/05/2017   Partial nontraumatic tear of rotator cuff, right 04/05/2017    Past Surgical History:  Procedure Laterality Date   ABDOMINAL HYSTERECTOMY  1993   CARPAL TUNNEL RELEASE Right    CERVICAL DISC SURGERY  04/29/2015   Dr Cierra Sis, [place]   ESOPHAGEAL MANOMETRY N/A 03/03/2021   Procedure: ESOPHAGEAL MANOMETRY (EM);  Surgeon: Wilford Corner, MD;  Location: [facility];  Service: Endoscopy;  Laterality: N/A;   ESOPHAGOGASTRODUODENOSCOPY ENDOSCOPY     with esophagus being stretched twice per pt,    EYE SURGERY     fatty 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      feeding tube placement and removal   2015   NASAL SEPTUM SURGERY     spurs removed from esophagus   2015   TONSILLECTOMY      OB History   No obstetric history on file.      Home Medications    Prior to  Admission medications   Medication Sig Start Date End Date Taking? Authorizing Provider  Acetaminophen-Codeine 300-30 MG tablet  12/19/20   [provider]  acyclovir (ZOVIRAX) 400 MG tablet Take 400 mg by mouth 2 (two) times daily.    [provider]  albuterol (PROAIR HFA) 108 (90 Base) MCG/ACT inhaler Inhale 1-2 puffs into the lungs every 6 (six) hours as needed for wheezing or shortness of breath. 06/14/20   Zigmund Gottron, NP  albuterol (PROVENTIL) (2.5 MG/3ML) 0.083% nebulizer solution Take 3 mLs (2.5 mg total) by nebulization every 4 (four) hours as needed for wheezing or shortness of breath. 03/11/19   Raylene Everts, MD  amoxicillin (AMOXIL) 500 MG capsule Take 1 capsule (500 mg total) by mouth 3 (three)  times daily. 04/19/21   Domenic [place]as, PA-C  Artificial Tear Ointment (DRY EYES OP) Place 1-2 drops into both eyes daily as needed (for dry eyes).     [provider]  Ascorbic Acid (VITAMIN C) 1000 MG tablet Take 1,000 mg by mouth daily.    [provider]  atorvastatin (LIPITOR) 10 MG tablet Take 10 mg by mouth daily.    [provider]  cholecalciferol (VITAMIN D3) 25 MCG (1000 UNIT) tablet Take 2,000 Units by mouth daily.     [provider]  cyclobenzaprine (FLEXERIL) 10 MG tablet Take 1 tablet (10 mg total) by mouth 2 (two) times daily as needed for muscle spasms. 04/19/21   Domenic [place], PA-C  cycloSPORINE (RESTASIS) 0.05 % ophthalmic emulsion Place 1 drop into both eyes 2 times daily. 02/02/21   [provider]  dicyclomine (BENTYL) 20 MG tablet Take 20 mg by mouth 3 (three) times daily. 10/17/19   [provider]  EPINEPHrine 0.1 MG/0.1ML SOAJ Inject as directed See admin instructions.    [provider]  fexofenadine (ALLEGRA) 180 MG tablet  12/24/20   [provider]  fluticasone (FLONASE) 50 MCG/ACT nasal spray Place 1 spray into both nostrils daily as needed for allergies or rhinitis.    [provider]  fluticasone-salmeterol (ADVAIR HFA) 115-21 MCG/ACT inhaler Inhale 2 puffs into the lungs 2 (two) times daily. 01/07/21   Kennith Gain, MD  gabapentin (NEURONTIN) 300 MG capsule Take 300 mg by mouth 3 (three) times daily.    [provider]  guaiFENesin (MUCINEX) 600 MG 12 hr tablet Take 2 tablets (1,200 mg total) by mouth 2 (two) times daily as needed. 01/06/21   Sharion Balloon, NP  HYDROcodone-Acetaminophen 10-325 MG/15ML SOLN Take by mouth.    [provider]  ibuprofen (ADVIL) 600 MG tablet Take 1 tablet (600 mg total) by mouth every 6 (six) hours as needed for moderate pain. 04/19/21   Domenic [place]as, PA-C  ipratropium (ATROVENT) 0.06 % nasal spray Place 2 sprays into both nostrils in the  morning and at bedtime. 01/07/21   Kennith Gain, MD  JANUMET XR 50-500 MG TB24 Take 1 tablet by mouth daily. 09/27/20   [provider]  ketorolac (ACULAR) 0.5 % ophthalmic solution One drop in operative eye(s) TID starting 2 days before surgery and continuing 3 weeks after surgery. 02/02/21   [provider]  metoprolol succinate (TOPROL-XL) 25 MG 24 hr tablet Take by mouth.    [provider]  metoprolol tartrate (LOPRESSOR) 25 MG tablet Take 25 mg by mouth 2 (two) times daily.    [provider]  montelukast (SINGULAIR) 10 MG tablet Take 10 mg by mouth daily.  [provider]  Multiple Vitamin (MULTIVITAMIN WITH MINERALS) TABS tablet Take 1 tablet by mouth daily.    [provider]  ofloxacin (OCUFLOX) 0.3 % ophthalmic solution Instill 1 drop TID in operative eye(s) starting 2 days prior to surgery and after surgery for 3 weeks. 02/02/21   [provider]  ondansetron (ZOFRAN-ODT) 8 MG disintegrating tablet Take 1 tablet (8 mg total) by mouth every 8 (eight) hours as needed for nausea or vomiting. 08/28/20   Jaynee Eagles, PA-C  pantoprazole (PROTONIX) 40 MG tablet Take 40 mg by mouth 2 (two) times daily.  11/20/19   [provider]  PANTOPRAZOLE SODIUM PO Take by mouth.    [provider]  prednisoLONE acetate (PRED FORTE) 1 % ophthalmic suspension 1 drop into affected eye 02/02/21   [provider]  predniSONE (DELTASONE) 20 MG tablet 3 tabs po day one, then 2 po daily x 4 days 04/19/21   Domenic [place], PA-C  promethazine-dextromethorphan (PROMETHAZINE-DM) 6.25-15 MG/5ML syrup Take 2.5 mLs by mouth 4 (four) times daily as needed for cough. 04/06/21   White, Leitha Schuller, NP  sucralfate (CARAFATE) 1 g tablet Take 1 g by mouth 4 (four) times daily.  02/11/20   [provider]  esomeprazole (NEXIUM) 40 MG capsule Take 1 capsule (40 mg total) by mouth daily. Patient not taking: Reported on 07/08/2020 04/24/19  08/06/20  Charlesetta Shanks, MD    Family History Family History  Problem Relation Age of Onset   Allergic rhinitis Mother    Diabetes Father        type 2   Heart disease Father    Hypertension Father    Allergic rhinitis Father    Heart disease Brother    Hypertension Brother    Allergic rhinitis Sister    Angioedema Neg Hx    Asthma Neg Hx    Atopy Neg Hx    Eczema Neg Hx    Immunodeficiency Neg Hx    Urticaria Neg Hx     Social History Social History   Tobacco Use   Smoking status: Former    Packs/day: 0.50    Years: 30.00    Pack years: 15.00    Types: Cigarettes    Quit date: 12/08/1994    Years since quitting: 26.4   Smokeless tobacco: Never  Vaping Use   Vaping Use: Never used  Substance Use Topics   Alcohol use: No   Drug use: No     Allergies   Crab [shellfish allergy], Erythromycin, Hm lidocaine patch [lidocaine], Erdafitinib, Fish allergy, Metoclopramide, Rosanil cleanser [sulfacetamide sodium-sulfur], Sulfacetamide, Doxycycline, Levaquin [levofloxacin in d5w], Morphine, Morphine and related, and Sulfa antibiotics   Review of Systems Review of Systems  As stated above in HPI Physical Exam Triage Vital Signs ED Triage Vitals  Enc Vitals Group     BP 05/21/21 1007 (!) 143/77     Pulse Rate 05/21/21 1005 (!) 108     Resp 05/21/21 1005 16     Temp 05/21/21 1005 99.4 F (37.4 C)     Temp Source 05/21/21 1005 Oral     SpO2 05/21/21 1005 100 %     Weight --      Height --      Head Circumference --      Peak Flow --      Pain Score 05/21/21 1003 0     Pain Loc --      Pain Edu? --      Excl. in  GC? --    No data found.  Updated Vital Signs BP (!) 143/77 (BP Location: Left Arm)   Pulse (!) 108   Temp 99.4 F (37.4 C) (Oral)   Resp 16   SpO2 100%   Physical Exam Vitals and nursing note reviewed.  Constitutional:      General: She is not in acute distress.    Appearance: She is well-developed. She is not ill-appearing, toxic-appearing  or diaphoretic.  HENT:     Head: Normocephalic and atraumatic.     Right Ear: Tympanic membrane and ear canal normal. No tenderness. No middle ear effusion. Tympanic membrane is not erythematous.     Left Ear: Tympanic membrane and ear canal normal. No tenderness.  No middle ear effusion. Tympanic membrane is not erythematous.     Nose: Congestion and rhinorrhea present.     Mouth/Throat:     Mouth: Mucous membranes are moist. Oral lesions present.     Pharynx: Uvula midline. Oropharyngeal exudate present. No pharyngeal swelling, posterior oropharyngeal erythema or uvula swelling.     Tonsils: No tonsillar exudate or tonsillar abscesses.  Eyes:     Conjunctiva/sclera: Conjunctivae normal.  Cardiovascular:     Rate and Rhythm: Normal rate and regular rhythm.     Heart sounds: Normal heart sounds.  Pulmonary:     Effort: Pulmonary effort is normal.     Breath sounds: Normal breath sounds.  Musculoskeletal:     Cervical back: Normal range of motion and neck supple.  Lymphadenopathy:     Cervical: Cervical adenopathy present.  Skin:    General: Skin is warm.  Neurological:     Mental Status: She is alert and oriented to person, place, and time.     UC Treatments / Results  Labs (all labs ordered are listed, but only abnormal results are displayed) Labs Reviewed - No data to display  EKG   Radiology No results found.  Procedures Procedures (including critical care time)  Medications Ordered in UC Medications - No data to display  Initial Impression / Assessment and Plan / UC Course  I have reviewed the triage vital signs and the nursing notes.  Pertinent labs & imaging results that were available during my care of the patient were reviewed by me and considered in my medical decision making (see chart for details).     New. Chest x ray pending. 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 Treating with augmentin given low grade  fever, increased sputum production and comorbidity according to GOLD treatment algorithm to prevent further systemic complications. Discussed red flag signs and symptyoms.    Final Clinical Impressions(s) / UC Diagnoses   Final diagnoses:  None   Discharge Instructions   None    ED Prescriptions   None    PDMP not reviewed this encounter.   Hughie Closs, [place] 05/21/21 1104

## 2021-05-21 NOTE — ED Triage Notes (Signed)
Pt in with c/o dry cough and ST x 2 weeks   Pt has been taking otc cough medication with no relief  Pt also c/o chills

## 2021-05-24 ENCOUNTER — Encounter: Payer: Self-pay | Admitting: *Deleted

## 2021-05-24 ENCOUNTER — Telehealth: Payer: Self-pay | Admitting: Diagnostic Neuroimaging

## 2021-05-24 MED ORDER — ALPRAZOLAM 0.5 MG PO TABS: ORAL_TABLET | ORAL | 0 refills | Status: DC

## 2021-05-24 NOTE — Telephone Encounter (Signed)
Meds ordered this encounter  Medications   ALPRAZolam (XANAX) 0.5 MG tablet    Sig: Take one tab 30-45 minutes prior to MRI, may repeat x 1 if needed before MRI.    Dispense:  2 tablet    Refill:  0   Penni Bombard, MD 06/15/5270, 2:92 AM Certified in Neurology, Neurophysiology and [facility] [address] [place], [place] [address] [phone]

## 2021-05-24 NOTE — Addendum Note (Signed)
Addended by: Andrey Spearman R on: 05/24/2021 09:29 AM   Modules accepted: Orders

## 2021-05-24 NOTE — Telephone Encounter (Signed)
RX has been sent. Sent patient my chart to inform her.

## 2021-05-24 NOTE — Telephone Encounter (Signed)
Pt is asking for something to be called in to help her relax for her MRI, pt would like it called into [facility] #[id number] -

## 2021-05-30 ENCOUNTER — Ambulatory Visit
Admission: RE | Admit: 2021-05-30 | Discharge: 2021-05-30 | Disposition: A | Discharge status: Home / Self Care | Payer: Medicare HMO | Source: Ambulatory Visit | Attending: Diagnostic Neuroimaging | Admitting: Diagnostic Neuroimaging

## 2021-05-30 ENCOUNTER — Other Ambulatory Visit: Payer: Self-pay

## 2021-05-30 DIAGNOSIS — M5442 Lumbago with sciatica, left side: Secondary | ICD-10-CM | POA: Diagnosis not present

## 2021-05-30 DIAGNOSIS — G8929 Other chronic pain: Secondary | ICD-10-CM

## 2021-05-30 DIAGNOSIS — M5441 Lumbago with sciatica, right side: Secondary | ICD-10-CM | POA: Diagnosis not present

## 2021-06-02 ENCOUNTER — Telehealth: Payer: Self-pay | Admitting: *Deleted

## 2021-06-02 DIAGNOSIS — R937 Abnormal findings on diagnostic imaging of other parts of musculoskeletal system: Secondary | ICD-10-CM

## 2021-06-02 DIAGNOSIS — G8929 Other chronic pain: Secondary | ICD-10-CM

## 2021-06-02 NOTE — Telephone Encounter (Signed)
Called patient, no answer, voice MB not set up.Marland Kitchen

## 2021-06-03 NOTE — Addendum Note (Signed)
Addended by: Florian Buff C on: 06/03/2021 11:03 AM   Modules accepted: Orders

## 2021-06-03 NOTE — Telephone Encounter (Signed)
Pt returned the call to nurse. Please call.

## 2021-06-03 NOTE — Telephone Encounter (Signed)
Spoke with patient and informed her MRI lumbar spine showed degenerative changes; mild pinched nerves at L5-S1. Dr Leta Baptist recommends PT evaluation vs pain management; if patient agrees. She prefers to see pain management. I advised MD will send referral. Patient verbalized understanding, appreciation.

## 2021-06-08 NOTE — Telephone Encounter (Addendum)
Faxed referral to Dr. Davy Pique or Dr. Brien Few at [facility] for pain management. Phone: [phone]. Fax: [phone].

## 2021-06-14 ENCOUNTER — Encounter (HOSPITAL_COMMUNITY): Payer: Self-pay

## 2021-06-14 ENCOUNTER — Other Ambulatory Visit: Payer: Self-pay

## 2021-06-14 ENCOUNTER — Ambulatory Visit (HOSPITAL_COMMUNITY)
Admission: EM | Admit: 2021-06-14 | Discharge: 2021-06-14 | Disposition: A | Discharge status: Home / Self Care | Payer: Medicare HMO | Attending: Student | Admitting: Student

## 2021-06-14 DIAGNOSIS — M5442 Lumbago with sciatica, left side: Secondary | ICD-10-CM | POA: Diagnosis not present

## 2021-06-14 DIAGNOSIS — G8929 Other chronic pain: Secondary | ICD-10-CM

## 2021-06-14 HISTORY — DX: Cardiomegaly: I51.7

## 2021-06-14 MED ORDER — KETOROLAC TROMETHAMINE 30 MG/ML IJ SOLN
30.0000 mg | Freq: Once | INTRAMUSCULAR | Status: AC
  Administered 2021-06-14: 30 mg via INTRAMUSCULAR

## 2021-06-14 MED ORDER — KETOROLAC TROMETHAMINE 30 MG/ML IJ SOLN
INTRAMUSCULAR | Status: AC
  Filled 2021-06-14: qty 1

## 2021-06-14 NOTE — ED Triage Notes (Addendum)
Pt reports recent MRI for back pain. States was sent to pain clinic but that it is taking too long to get in- reports shooting pains from left side of back down into left thigh, sometimes into right leg as well. States pain is so bad that legs will sometimes give out and pt has to catch herself from falling.

## 2021-06-14 NOTE — ED Provider Notes (Signed)
[facility]    CSN: [id number] Arrival date & time: 06/14/21  1903      History   Chief Complaint Chief Complaint  Patient presents with   Back Pain   Leg Pain    HPI LETTICIA BHATTACHARYYA is a 65 y.o. female presenting with chronic back pain.  Long history chronic back pain, diabetic neuropathy, diabetes, arthritis, hypertension.  This patient has a long history of chronic back pain, she is followed by Ortho and had an MRI done 2 weeks ago.  States that she has been informed of the results, but cannot wait for her pain clinic appointment and so she presented to this urgent care today.  Denies absolutely any new symptoms or trauma.  Has failed treatment with Flexeril.  Today again endorses left-sided lumbar paraspinous muscle tenderness.  Occasionally radiation of pain down the left leg, unchanged, but denies the symptom at time of visit.  Adamantly denies urinary symptoms. Denies numbness in arms/legs, denies weakness in arms/legs, denies saddle anesthesia, denies bowel/bladder incontinence, denies urinary retention, denies constipation.   HPI  Past Medical History:  Diagnosis Date   Arthritis    Asthma    Carotid stenosis, bilateral    Chronic low back pain    COPD (chronic obstructive pulmonary disease) (HCC)    Diabetes mellitus without complication ([place])    type II    Diabetic neuropathy (HCC)    Diverticulitis    Dyspnea    mild    Frequent falls    GERD (gastroesophageal reflux disease)    History of bronchitis    Hypercholesterolemia    Hypertension    IBS (irritable bowel syndrome)    Osteoarthritis    knee   Palpitations    Right ventricular enlargement    per pt report   Sleep apnea    mild but no sleep apnea    TMJ (dislocation of temporomandibular joint)    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    Vitamin D deficiency     Patient Active Problem List   Diagnosis Date Noted   Adenomatous polyp of colon 12/04/2020   Anal fissure  12/04/2020   Chronic idiopathic constipation 12/04/2020   Constipation 12/04/2020   Dysphagia 12/04/2020   Gastroesophageal reflux disease 12/04/2020   Rectal pain 12/04/2020   Right upper quadrant pain 12/04/2020   Dizzy 12/11/2017   Pharyngeal dysphagia 12/11/2017   Post-nasal drip 12/11/2017   Referred ear pain, bilateral 12/11/2017   Diabetes mellitus without complication ([place]) 14/48/1856   Family history of glaucoma 06/29/2017   Hyperopia of both eyes with astigmatism and presbyopia 06/29/2017   Keratoconjunctivitis sicca of both eyes not specified as Sjogren's 06/29/2017   Nuclear sclerotic cataract of both eyes 06/29/2017   Vitreous floater, bilateral 06/29/2017   Biceps tendinitis of right shoulder 04/05/2017   Impingement syndrome of right shoulder 04/05/2017   Arthritis of right acromioclavicular joint 04/05/2017   Partial nontraumatic tear of rotator cuff, right 04/05/2017    Past Surgical History:  Procedure Laterality Date   ABDOMINAL HYSTERECTOMY  1993   CARPAL TUNNEL RELEASE Right    CERVICAL [place] SURGERY  04/29/2015   Dr Caliya Sis, [place]   ESOPHAGEAL MANOMETRY N/A 03/03/2021   Procedure: ESOPHAGEAL MANOMETRY (EM);  Surgeon: Wilford Corner, MD;  Location: [facility];  Service: Endoscopy;  Laterality: N/A;   ESOPHAGOGASTRODUODENOSCOPY ENDOSCOPY     with esophagus being stretched twice per pt,    EYE SURGERY  fatty 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      feeding tube placement and removal   2015   NASAL SEPTUM SURGERY     spurs removed from esophagus   2015   TONSILLECTOMY      OB History   No obstetric history on file.      Home Medications    Prior to Admission medications   Medication Sig Start Date End Date Taking? Authorizing Provider  Acetaminophen-Codeine 300-30 MG tablet  12/19/20   [provider]  acyclovir (ZOVIRAX) 400 MG tablet Take 400 mg by mouth 2 (two) times daily.    [provider]  albuterol (PROAIR HFA) 108  (90 Base) MCG/ACT inhaler Inhale 1-2 puffs into the lungs every 6 (six) hours as needed for wheezing or shortness of breath. 06/14/20   Zigmund Gottron, NP  albuterol (PROVENTIL) (2.5 MG/3ML) 0.083% nebulizer solution Take 3 mLs (2.5 mg total) by nebulization every 4 (four) hours as needed for wheezing or shortness of breath. 03/11/19   Raylene Everts, MD  ALPRAZolam Duanne Moron) 0.5 MG tablet Take one tab 30-45 minutes prior to MRI, may repeat x 1 if needed before MRI. 05/24/21   Penumalli, Earlean Polka, MD  amoxicillin-clavulanate (AUGMENTIN) 875-125 MG tablet Take 1 tablet by mouth every 12 (twelve) hours. 05/21/21   Hughie Closs, PA-C  Artificial Tear Ointment (DRY EYES OP) Place 1-2 drops into both eyes daily as needed (for dry eyes).     [provider]  Ascorbic Acid (VITAMIN C) 1000 MG tablet Take 1,000 mg by mouth daily.    [provider]  atorvastatin (LIPITOR) 10 MG tablet Take 10 mg by mouth daily.    [provider]  cholecalciferol (VITAMIN D3) 25 MCG (1000 UNIT) tablet Take 2,000 Units by mouth daily.     [provider]  cyclobenzaprine (FLEXERIL) 10 MG tablet Take 1 tablet (10 mg total) by mouth 2 (two) times daily as needed for muscle spasms. 04/19/21   Domenic Moras, PA-C  cycloSPORINE (RESTASIS) 0.05 % ophthalmic emulsion Place 1 drop into both eyes 2 times daily. 02/02/21   [provider]  dicyclomine (BENTYL) 20 MG tablet Take 20 mg by mouth 3 (three) times daily. 10/17/19   [provider]  EPINEPHrine 0.1 MG/0.1ML SOAJ Inject as directed See admin instructions.    [provider]  fexofenadine (ALLEGRA) 180 MG tablet  12/24/20   [provider]  fluticasone (FLONASE) 50 MCG/ACT nasal spray Place 1 spray into both nostrils daily as needed for allergies or rhinitis.    [provider]  fluticasone-salmeterol (ADVAIR HFA) 115-21 MCG/ACT inhaler Inhale 2 puffs into the lungs 2 (two) times daily. 01/07/21    Kennith Gain, MD  gabapentin (NEURONTIN) 300 MG capsule Take 300 mg by mouth 3 (three) times daily.    [provider]  guaiFENesin (MUCINEX) 600 MG 12 hr tablet Take 2 tablets (1,200 mg total) by mouth 2 (two) times daily as needed. 01/06/21   Sharion Balloon, NP  HYDROcodone-Acetaminophen 10-325 MG/15ML SOLN Take by mouth.    [provider]  ibuprofen (ADVIL) 600 MG tablet Take 1 tablet (600 mg total) by mouth every 6 (six) hours as needed for moderate pain. 04/19/21   Domenic Moras, PA-C  ipratropium (ATROVENT) 0.06 % nasal spray Place 2 sprays into both nostrils in the morning and at bedtime. 01/07/21   Kennith Gain, MD  JANUMET XR 50-500 MG TB24 Take 1 tablet by mouth  daily. 09/27/20   [provider]  ketorolac (ACULAR) 0.5 % ophthalmic solution One drop in operative eye(s) TID starting 2 days before surgery and continuing 3 weeks after surgery. 02/02/21   [provider]  metoprolol succinate (TOPROL-XL) 25 MG 24 hr tablet Take by mouth.    [provider]  metoprolol tartrate (LOPRESSOR) 25 MG tablet Take 25 mg by mouth 2 (two) times daily.    [provider]  montelukast (SINGULAIR) 10 MG tablet Take 10 mg by mouth daily.     [provider]  Multiple Vitamin (MULTIVITAMIN WITH MINERALS) TABS tablet Take 1 tablet by mouth daily.    [provider]  ofloxacin (OCUFLOX) 0.3 % ophthalmic solution Instill 1 drop TID in operative eye(s) starting 2 days prior to surgery and after surgery for 3 weeks. 02/02/21   [provider]  ondansetron (ZOFRAN-ODT) 8 MG disintegrating tablet Take 1 tablet (8 mg total) by mouth every 8 (eight) hours as needed for nausea or vomiting. 08/28/20   Jaynee Eagles, PA-C  pantoprazole (PROTONIX) 40 MG tablet Take 40 mg by mouth 2 (two) times daily.  11/20/19   [provider]  PANTOPRAZOLE SODIUM PO Take by mouth.    [provider]  prednisoLONE acetate (PRED  FORTE) 1 % ophthalmic suspension 1 drop into affected eye 02/02/21   [provider]  predniSONE (DELTASONE) 20 MG tablet 3 tabs po day one, then 2 po daily x 4 days 04/19/21   Domenic Moras, PA-C  promethazine-dextromethorphan (PROMETHAZINE-DM) 6.25-15 MG/5ML syrup Take 2.5 mLs by mouth 4 (four) times daily as needed for cough. 04/06/21   White, Leitha Schuller, NP  sucralfate (CARAFATE) 1 g tablet Take 1 g by mouth 4 (four) times daily.  02/11/20   [provider]  esomeprazole (NEXIUM) 40 MG capsule Take 1 capsule (40 mg total) by mouth daily. Patient not taking: Reported on 07/08/2020 04/24/19 08/06/20  Charlesetta Shanks, MD    Family History Family History  Problem Relation Age of Onset   Allergic rhinitis Mother    Diabetes Father        type 2   Heart disease Father    Hypertension Father    Allergic rhinitis Father    Heart disease Brother    Hypertension Brother    Allergic rhinitis Sister    Angioedema Neg Hx    Asthma Neg Hx    Atopy Neg Hx    Eczema Neg Hx    Immunodeficiency Neg Hx    Urticaria Neg Hx     Social History Social History   Tobacco Use   Smoking status: Former    Packs/day: 0.50    Years: 30.00    Pack years: 15.00    Types: Cigarettes    Quit date: 12/08/1994    Years since quitting: 26.5   Smokeless tobacco: Never  Vaping Use   Vaping Use: Never used  Substance Use Topics   Alcohol use: No   Drug use: No     Allergies   Crab [shellfish allergy], Erythromycin, Hm lidocaine patch [lidocaine], Erdafitinib, Fish allergy, Metoclopramide, Rosanil cleanser [sulfacetamide sodium-sulfur], Sulfacetamide, Doxycycline, Levaquin [levofloxacin in d5w], Morphine, Morphine and related, and Sulfa antibiotics   Review of Systems Review of Systems  Constitutional:  Negative for chills, fever and unexpected weight change.  Respiratory:  Negative for chest tightness and shortness of breath.   Cardiovascular:  Negative for chest pain and palpitations.   Gastrointestinal:  Negative for abdominal pain, diarrhea, nausea  and vomiting.  Genitourinary:  Negative for decreased urine volume, difficulty urinating and frequency.  Musculoskeletal:  Positive for back pain. Negative for arthralgias, gait problem, joint swelling, myalgias, neck pain and neck stiffness.  Skin:  Negative for wound.  Neurological:  Negative for dizziness, tremors, seizures, syncope, facial asymmetry, speech difficulty, weakness, light-headedness, numbness and headaches.  All other systems reviewed and are negative.   Physical Exam Triage Vital Signs ED Triage Vitals [06/14/21 1953]  Enc Vitals Group     BP (!) 143/80     Pulse Rate 77     Resp 18     Temp 98.8 F (37.1 C)     Temp src      SpO2 100 %     Weight      Height      Head Circumference      Peak Flow      Pain Score 9     Pain Loc      Pain Edu?      Excl. in [place]?    No data found.  Updated Vital Signs BP (!) 143/80   Pulse 77   Temp 98.8 F (37.1 C)   Resp 18   SpO2 100%   Visual Acuity Right Eye Distance:   Left Eye Distance:   Bilateral Distance:    Right Eye Near:   Left Eye Near:    Bilateral Near:     Physical Exam Vitals reviewed.  Constitutional:      General: She is not in acute distress.    Appearance: Normal appearance. She is not ill-appearing.  HENT:     Head: Normocephalic and atraumatic.  Cardiovascular:     Rate and Rhythm: Normal rate and regular rhythm.     Heart sounds: Normal heart sounds.  Pulmonary:     Effort: Pulmonary effort is normal.     Breath sounds: Normal breath sounds and air entry.  Abdominal:     Tenderness: There is no abdominal tenderness. There is no right CVA tenderness, left CVA tenderness, guarding or rebound.  Musculoskeletal:     Cervical back: Normal range of motion. No swelling, deformity, signs of trauma, rigidity, spasms, tenderness, bony tenderness or crepitus. No pain with movement.     Thoracic back: No swelling, deformity,  signs of trauma, spasms, tenderness or bony tenderness. Normal range of motion. No scoliosis.     Lumbar back: Spasms present. No swelling, deformity, signs of trauma, tenderness or bony tenderness. Normal range of motion. Negative right straight leg raise test and negative left straight leg raise test. No scoliosis.     Comments: Left sided lumbar paraspinous muscle tenderness to palpation.  Strength and sensation intact upper and lower extremities, no saddle anesthesia.  Gait intact but with pain.  Negative straight leg raise bilaterally. No midline spinous tenderness, deformity, stepoff.  Absolutely no other injury, deformity, tenderness, ecchymosis, abrasion.  Neurological:     General: No focal deficit present.     Mental Status: She is alert.     Cranial Nerves: No cranial nerve deficit.  Psychiatric:        Mood and Affect: Mood normal.        Behavior: Behavior normal.        Thought Content: Thought content normal.        Judgment: Judgment normal.     UC Treatments / Results  Labs (all labs ordered are listed, but only abnormal results are displayed) Labs Reviewed - No data to  display  EKG   Radiology No results found.  Procedures Procedures (including critical care time)  Medications Ordered in UC Medications  ketorolac (TORADOL) 30 MG/ML injection 30 mg (30 mg Intramuscular Given 06/14/21 2035)    Initial Impression / Assessment and Plan / UC Course  I have reviewed the triage vital signs and the nursing notes.  Pertinent labs & imaging results that were available during my care of the patient were reviewed by me and considered in my medical decision making (see chart for details).     This patient is a very pleasant 65 y.o. year old female presenting with chronic back pain.  No red flag symptoms This patient is already followed by Ortho and neurology, MRI 3 weeks ago showed degenerative changes and pinched nerves in lumbar spine.  She has been referred to pain  management, but has not established with them yet.  No new trauma or overuse. She is a diabetic, will avoid prednisone. Toradol administerd today, creatinine noraml range 01/2021 Continue tylenol. F/u with neurology if symptoms persist, she is already followed with them. Establish care with pain clinic. Red flag symptoms and ED return precautions discussed. Patient verbalizes understanding and agreement.   Final Clinical Impressions(s) / UC Diagnoses   Final diagnoses:  Chronic left-sided low back pain with left-sided sciatica     Discharge Instructions      -Call your neurologist tomorrow to ask for a follow-up -Head to ED if new symptoms like worst pain of life, new weakness, new urinary symptoms     ED Prescriptions   None    PDMP not reviewed this encounter.   Hazel Sams, PA-C 06/15/21 [id number]

## 2021-06-14 NOTE — Discharge Instructions (Addendum)
-  Call your neurologist tomorrow to ask for a follow-up -Head to ED if new symptoms like worst pain of life, new weakness, new urinary symptoms

## 2021-06-15 ENCOUNTER — Ambulatory Visit: Payer: Medicare HMO | Admitting: Podiatry

## 2021-07-13 ENCOUNTER — Ambulatory Visit (INDEPENDENT_AMBULATORY_CARE_PROVIDER_SITE_OTHER): Payer: Medicare HMO | Admitting: Podiatry

## 2021-07-13 ENCOUNTER — Encounter: Payer: Self-pay | Admitting: Podiatry

## 2021-07-13 ENCOUNTER — Other Ambulatory Visit: Payer: Self-pay

## 2021-07-13 DIAGNOSIS — L84 Corns and callosities: Secondary | ICD-10-CM

## 2021-07-13 DIAGNOSIS — B351 Tinea unguium: Secondary | ICD-10-CM | POA: Diagnosis not present

## 2021-07-13 DIAGNOSIS — M79674 Pain in right toe(s): Secondary | ICD-10-CM

## 2021-07-13 DIAGNOSIS — E1142 Type 2 diabetes mellitus with diabetic polyneuropathy: Secondary | ICD-10-CM | POA: Diagnosis not present

## 2021-07-13 DIAGNOSIS — M79675 Pain in left toe(s): Secondary | ICD-10-CM

## 2021-07-18 NOTE — Progress Notes (Signed)
Subjective: Pamela Roberson is a pleasant 65 y.o. female patient seen today painful thick toenails that are difficult to trim. Pain interferes with ambulation. Aggravating factors include wearing enclosed shoe gear. Pain is relieved with periodic professional debridement.  She has h/o diabetic neuropathy. She has not been monitoring her blood glucose due to her not having a glucometer.  PCP is Glendon Axe, MD. Last visit was: three months ago per patient recollection.  Allergies  Allergen Reactions   Crab [Shellfish Allergy] Shortness Of Breath and Swelling   Erythromycin Swelling and Rash   Hm Lidocaine Patch [Lidocaine]     Burning on skin   Erdafitinib     Other reaction(s): pt not sure   Fish Allergy     Swelling    Metoclopramide Other (See Comments)    Slurred speech and unable to move muscles,  Caused her to drag her feet   Rosanil Cleanser [Sulfacetamide Sodium-Sulfur]     Other reaction(s): itching, rash   Sulfacetamide     Other reaction(s): itching, rash   Doxycycline     GI Intolerance   Levaquin [Levofloxacin In D5w] Other (See Comments)    Causes irregular heart beat   Morphine Nausea And Vomiting and Rash    Irregular heart beat   Morphine And Related Nausea And Vomiting and Rash    Irregular heart beat   Sulfa Antibiotics Rash    Objective: Physical Exam  General: Pamela Roberson is a pleasant 65 y.o. African American female, in NAD. AAO x 3.   Vascular:  Capillary refill time to digits immediate b/l. Palpable DP pulse(s) b/l lower extremities Faintly palpable PT pulse(s) b/l lower extremities. Pedal hair absent. Lower extremity skin temperature gradient within normal limits. No pain with calf compression b/l.  Dermatological:  Pedal skin with normal turgor, texture and tone b/l lower extremities. Toenails 1-5 b/l elongated, discolored, dystrophic, thickened, crumbly with subungual debris and tenderness to dorsal palpation. Hyperkeratotic  lesion(s) submet head 1 right foot, submet head 5 left foot, submet head 5 right foot, and sub 5th met base right foot.  No erythema, no edema, no drainage, no fluctuance.  Musculoskeletal:  Normal muscle strength 5/5 to all lower extremity muscle groups bilaterally. No pain crepitus or joint limitation noted with ROM b/l lower extremities.  Neurological:  Pt has subjective symptoms of neuropathy. Protective sensation intact 5/5 intact bilaterally with 10g monofilament b/l. Vibratory sensation intact b/l.  Assessment and Plan:  1. Pain due to onychomycosis of toenails of both feet   2. Callus   3. Diabetic peripheral neuropathy associated with type 2 diabetes mellitus ([place])      -Examined patient. -Continue diabetic foot care principles: inspect feet daily, monitor glucose as recommended by PCP and/or Endocrinologist, and follow prescribed diet per PCP, Endocrinologist and/or dietician. -Patient to continue soft, supportive shoe gear daily. -Toenails 1-5 b/l were debrided in length and girth with sterile nail nippers and dremel without iatrogenic bleeding.  -Callus(es) submet head 1 right foot, submet head 5 left foot, submet head 5 right foot, and sub 5th met base right foot pared utilizing sterile scalpel blade without complication or incident. Total number debrided =4. -Patient to report any pedal injuries to medical professional immediately. -Patient/POA to call should there be question/concern in the interim.  Return in about 3 months (around 10/13/2021).  Marzetta Board, DPM

## 2021-07-25 ENCOUNTER — Encounter (HOSPITAL_COMMUNITY): Payer: Self-pay | Admitting: Emergency Medicine

## 2021-07-25 ENCOUNTER — Other Ambulatory Visit: Payer: Self-pay

## 2021-07-25 ENCOUNTER — Ambulatory Visit (HOSPITAL_COMMUNITY)
Admission: EM | Admit: 2021-07-25 | Discharge: 2021-07-25 | Disposition: A | Discharge status: Home / Self Care | Payer: Medicare HMO | Attending: Emergency Medicine | Admitting: Emergency Medicine

## 2021-07-25 DIAGNOSIS — B349 Viral infection, unspecified: Secondary | ICD-10-CM | POA: Insufficient documentation

## 2021-07-25 DIAGNOSIS — Z20822 Contact with and (suspected) exposure to covid-19: Secondary | ICD-10-CM | POA: Insufficient documentation

## 2021-07-25 NOTE — ED Provider Notes (Signed)
[place]    CSN: [id number] Arrival date & time: 07/25/21  1002      History   Chief Complaint Chief Complaint  Patient presents with   Sore Throat   Headache   Chills    HPI Pamela Roberson is a 65 y.o. female.   Patient here for evaluation of sore throat, chills, body aches, and congestion that has been ongoing since Tuesday or Wednesday.  Reports several possible exposures to COVID.  Reports taking OTC medications with some symptom relief.  Denies any trauma, injury, or other precipitating event.  Denies any chest pain, shortness of breath, numbness, tingling, weakness, abdominal pain, or headaches.    The history is provided by the patient.  Sore Throat Associated symptoms include headaches. Pertinent negatives include no chest pain and no shortness of breath.  Headache Associated symptoms: congestion, cough and sore throat    Past Medical History:  Diagnosis Date   Arthritis    Asthma    Carotid stenosis, bilateral    Chronic low back pain    COPD (chronic obstructive pulmonary disease) ([place])    Diabetes mellitus without complication ([place])    type II    Diabetic neuropathy (HCC)    Diverticulitis    Dyspnea    mild    Frequent falls    GERD (gastroesophageal reflux disease)    History of bronchitis    Hypercholesterolemia    Hypertension    IBS (irritable bowel syndrome)    Osteoarthritis    knee   Palpitations    Right ventricular enlargement    per pt report   Sleep apnea    mild but no sleep apnea    TMJ (dislocation of temporomandibular joint)    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    Vitamin D deficiency     Patient Active Problem List   Diagnosis Date Noted   Adenomatous polyp of colon 12/04/2020   Anal fissure 12/04/2020   Chronic idiopathic constipation 12/04/2020   Constipation 12/04/2020   Dysphagia 12/04/2020   Gastroesophageal reflux disease 12/04/2020   Rectal pain 12/04/2020   Right upper quadrant pain  12/04/2020   Dizzy 12/11/2017   Pharyngeal dysphagia 12/11/2017   Post-nasal drip 12/11/2017   Referred ear pain, bilateral 12/11/2017   Diabetes mellitus without complication ([place]) [id number]   Family history of glaucoma 06/29/2017   Hyperopia of both eyes with astigmatism and presbyopia 06/29/2017   Keratoconjunctivitis sicca of both eyes not specified as Sjogren's 06/29/2017   Nuclear sclerotic cataract of both eyes 06/29/2017   Vitreous floater, bilateral 06/29/2017   Biceps tendinitis of right shoulder 04/05/2017   Impingement syndrome of right shoulder 04/05/2017   Arthritis of right acromioclavicular joint 04/05/2017   Partial nontraumatic tear of rotator cuff, right 04/05/2017    Past Surgical History:  Procedure Laterality Date   ABDOMINAL HYSTERECTOMY  1993   CARPAL TUNNEL RELEASE Right    CERVICAL [place] SURGERY  04/29/2015   Dr Lashawnda Sis, [place] N/A 03/03/2021   Procedure: ESOPHAGEAL MANOMETRY (EM);  Surgeon: Wilford Corner, MD;  Location: [facility];  Service: Endoscopy;  Laterality: N/A;   ESOPHAGOGASTRODUODENOSCOPY ENDOSCOPY     with esophagus being stretched twice per pt,    EYE SURGERY     fatty 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      feeding tube placement and removal   2015   NASAL SEPTUM SURGERY     spurs removed from esophagus  2015   TONSILLECTOMY      OB History   No obstetric history on file.      Home Medications    Prior to Admission medications   Medication Sig Start Date End Date Taking? Authorizing Provider  Acetaminophen-Codeine 300-30 MG tablet  12/19/20   [provider]  acyclovir (ZOVIRAX) 400 MG tablet Take 400 mg by mouth 2 (two) times daily.    [provider]  albuterol (PROAIR HFA) 108 (90 Base) MCG/ACT inhaler Inhale 1-2 puffs into the lungs every 6 (six) hours as needed for wheezing or shortness of breath. 06/14/20   Zigmund Gottron, NP  albuterol (PROVENTIL) (2.5 MG/3ML) 0.083%  nebulizer solution Take 3 mLs (2.5 mg total) by nebulization every 4 (four) hours as needed for wheezing or shortness of breath. 03/11/19   Raylene Everts, MD  Alcohol Swabs (B-D SINGLE USE SWABS REGULAR) PADS Apply topically. 05/16/21   [provider]  ALPRAZolam Duanne Moron) 0.5 MG tablet Take one tab 30-45 minutes prior to MRI, may repeat x 1 if needed before MRI. 05/24/21   Penumalli, Earlean Polka, MD  amoxicillin-clavulanate (AUGMENTIN) 875-125 MG tablet Take 1 tablet by mouth every 12 (twelve) hours. 05/21/21   Hughie Closs, PA-C  Artificial Tear Ointment (DRY EYES OP) Place 1-2 drops into both eyes daily as needed (for dry eyes).     [provider]  Ascorbic Acid (VITAMIN C) 1000 MG tablet Take 1,000 mg by mouth daily.    [provider]  atorvastatin (LIPITOR) 10 MG tablet Take 10 mg by mouth daily.    [provider]  cholecalciferol (VITAMIN D3) 25 MCG (1000 UNIT) tablet Take 2,000 Units by mouth daily.     [provider]  cyclobenzaprine (FLEXERIL) 10 MG tablet Take 1 tablet (10 mg total) by mouth 2 (two) times daily as needed for muscle spasms. 04/19/21   Domenic Moras, PA-C  cycloSPORINE (RESTASIS) 0.05 % ophthalmic emulsion Place 1 drop into both eyes 2 times daily. 02/02/21   [provider]  dicyclomine (BENTYL) 20 MG tablet Take 20 mg by mouth 3 (three) times daily. 10/17/19   [provider]  EPINEPHrine 0.1 MG/0.1ML SOAJ Inject as directed See admin instructions.    [provider]  fexofenadine (ALLEGRA) 180 MG tablet  12/24/20   [provider]  fluticasone (FLONASE) 50 MCG/ACT nasal spray Place 1 spray into both nostrils daily as needed for allergies or rhinitis.    [provider]  fluticasone-salmeterol (ADVAIR HFA) 115-21 MCG/ACT inhaler Inhale 2 puffs into the lungs 2 (two) times daily. 01/07/21   Kennith Gain, MD  gabapentin (NEURONTIN) 300 MG capsule Take 300 mg by mouth 3 (three)  times daily.    [provider]  guaiFENesin (MUCINEX) 600 MG 12 hr tablet Take 2 tablets (1,200 mg total) by mouth 2 (two) times daily as needed. 01/06/21   Sharion Balloon, NP  HYDROcodone-Acetaminophen 10-325 MG/15ML SOLN Take by mouth.    [provider]  ibuprofen (ADVIL) 600 MG tablet Take 1 tablet (600 mg total) by mouth every 6 (six) hours as needed for moderate pain. 04/19/21   Domenic Moras, PA-C  ipratropium (ATROVENT) 0.06 % nasal spray Place 2 sprays into both nostrils in the morning and at bedtime. 01/07/21   Kennith Gain, MD  JANUMET XR 50-500 MG TB24 Take 1 tablet by mouth daily. 09/27/20   [provider]  ketorolac (ACULAR) 0.5 % ophthalmic solution One drop in  operative eye(s) TID starting 2 days before surgery and continuing 3 weeks after surgery. 02/02/21   [provider]  metoprolol succinate (TOPROL-XL) 25 MG 24 hr tablet Take by mouth.    [provider]  metoprolol tartrate (LOPRESSOR) 25 MG tablet Take 25 mg by mouth 2 (two) times daily.    [provider]  montelukast (SINGULAIR) 10 MG tablet Take 10 mg by mouth daily.     [provider]  Multiple Vitamin (MULTIVITAMIN WITH MINERALS) TABS tablet Take 1 tablet by mouth daily.    [provider]  nystatin cream (MYCOSTATIN) Apply 1 application topically 2 (two) times daily. 03/18/21   [provider]  ofloxacin (OCUFLOX) 0.3 % ophthalmic solution Instill 1 drop TID in operative eye(s) starting 2 days prior to surgery and after surgery for 3 weeks. 02/02/21   [provider]  ondansetron (ZOFRAN) 4 MG tablet Take 4 mg by mouth every 8 (eight) hours as needed. 02/15/21   [provider]  ondansetron (ZOFRAN-ODT) 8 MG disintegrating tablet Take 1 tablet (8 mg total) by mouth every 8 (eight) hours as needed for nausea or vomiting. 08/28/20   Jaynee Eagles, PA-C  pantoprazole (PROTONIX) 40 MG tablet Take 40 mg by mouth 2 (two) times  daily.  11/20/19   [provider]  PANTOPRAZOLE SODIUM PO Take by mouth.    [provider]  prednisoLONE acetate (PRED FORTE) 1 % ophthalmic suspension 1 drop into affected eye 02/02/21   [provider]  predniSONE (DELTASONE) 20 MG tablet 3 tabs po day one, then 2 po daily x 4 days 04/19/21   Domenic Moras, PA-C  promethazine-dextromethorphan (PROMETHAZINE-DM) 6.25-15 MG/5ML syrup Take 2.5 mLs by mouth 4 (four) times daily as needed for cough. 04/06/21   White, Leitha Schuller, NP  sucralfate (CARAFATE) 1 g tablet Take 1 g by mouth 4 (four) times daily.  02/11/20   [provider]  esomeprazole (NEXIUM) 40 MG capsule Take 1 capsule (40 mg total) by mouth daily. Patient not taking: Reported on 07/08/2020 04/24/19 08/06/20  Charlesetta Shanks, MD    Family History Family History  Problem Relation Age of Onset   Allergic rhinitis Mother    Diabetes Father        type 2   Heart disease Father    Hypertension Father    Allergic rhinitis Father    Heart disease Brother    Hypertension Brother    Allergic rhinitis Sister    Angioedema Neg Hx    Asthma Neg Hx    Atopy Neg Hx    Eczema Neg Hx    Immunodeficiency Neg Hx    Urticaria Neg Hx     Social History Social History   Tobacco Use   Smoking status: Former    Packs/day: 0.50    Years: 30.00    Pack years: 15.00    Types: Cigarettes    Quit date: 12/08/1994    Years since quitting: 26.6   Smokeless tobacco: Never  Vaping Use   Vaping Use: Never used  Substance Use Topics   Alcohol use: No   Drug use: No     Allergies   Crab [shellfish allergy], Erythromycin, Hm lidocaine patch [lidocaine], Erdafitinib, Fish allergy, Metoclopramide, Rosanil cleanser [sulfacetamide sodium-sulfur], Sulfacetamide, Doxycycline, Levaquin [levofloxacin in d5w], Morphine, Morphine and related, and Sulfa antibiotics   Review of Systems Review of Systems  Constitutional:  Positive for chills.  HENT:  Positive for congestion  and sore throat.  Respiratory:  Positive for cough. Negative for chest tightness and shortness of breath.   Cardiovascular:  Negative for chest pain.  Neurological:  Positive for headaches.  All other systems reviewed and are negative.   Physical Exam Triage Vital Signs ED Triage Vitals  Enc Vitals Group     BP 07/25/21 1020 (!) 153/78     Pulse Rate 07/25/21 1020 (!) 59     Resp 07/25/21 1020 16     Temp 07/25/21 1020 99.3 F (37.4 C)     Temp Source 07/25/21 1020 Oral     SpO2 07/25/21 1020 100 %     Weight --      Height --      Head Circumference --      Peak Flow --      Pain Score 07/25/21 1017 0     Pain Loc --      Pain Edu? --      Excl. in [place]? --    No data found.  Updated Vital Signs BP (!) 153/78 (BP Location: Right Arm)   Pulse (!) 59   Temp 99.3 F (37.4 C) (Oral)   Resp 16   SpO2 100%   Visual Acuity Right Eye Distance:   Left Eye Distance:   Bilateral Distance:    Right Eye Near:   Left Eye Near:    Bilateral Near:     Physical Exam Vitals and nursing note reviewed.  Constitutional:      General: She is not in acute distress.    Appearance: Normal appearance. She is not ill-appearing, toxic-appearing or diaphoretic.  HENT:     Head: Normocephalic and atraumatic.     Nose: Congestion present.     Mouth/Throat:     Pharynx: Uvula midline. Pharyngeal swelling and posterior oropharyngeal erythema present. No oropharyngeal exudate or uvula swelling.     Tonsils: No tonsillar exudate or tonsillar abscesses. 1+ on the right. 1+ on the left.  Eyes:     Conjunctiva/sclera: Conjunctivae normal.  Cardiovascular:     Rate and Rhythm: Normal rate and regular rhythm.     Pulses: Normal pulses.     Heart sounds: Normal heart sounds.  Pulmonary:     Effort: Pulmonary effort is normal.     Breath sounds: Normal breath sounds.  Abdominal:     General: Abdomen is flat.  Musculoskeletal:        General: Normal range of motion.     Cervical back:  Normal range of motion.  Skin:    General: Skin is warm and dry.  Neurological:     General: No focal deficit present.     Mental Status: She is alert and oriented to person, place, and time.  Psychiatric:        Mood and Affect: Mood normal.     UC Treatments / Results  Labs (all labs ordered are listed, but only abnormal results are displayed) Labs Reviewed  SARS CORONAVIRUS 2 (TAT 6-24 HRS)    EKG   Radiology No results found.  Procedures Procedures (including critical care time)  Medications Ordered in UC Medications - No data to display  Initial Impression / Assessment and Plan / UC Course  I have reviewed the triage vital signs and the nursing notes.  Pertinent labs & imaging results that were available during my care of the patient were reviewed by me and considered in my medical decision making (see chart for details).    Assessment negative for red  flags or concerns.  COVID test pending.  Likely viral illness.  Discussed conservative symptom management as described in discharge instructions.  Follow-up as needed Final Clinical Impressions(s) / UC Diagnoses   Final diagnoses:  Viral illness     Discharge Instructions      Try a BRAT (bananas, rice, applesause, toast) or bland food diet for the next few days.  Stick with foods that are gentle on your stomach.  Avoid foods that are difficult to digest, such as diary, fried, or spicy foods.  As you feel better, you can start eating like you do normally.    You can use ginger (ginger ale, ginger candy) and mint for nausea.    You can take Tylenol and/or Ibuprofen as needed for fever reduction and pain relief.   For cough: honey 1/2 to 1 teaspoon (you can dilute the honey in water or another fluid).  You can also use guaifenesin and dextromethorphan for cough. You can use a humidifier for chest congestion and cough.  If you don't have a humidifier, you can sit in the bathroom with the hot shower running.      For sore throat: try warm salt water gargles, cepacol lozenges, throat spray, warm tea or water with lemon/honey, popsicles or ice, or OTC cold relief medicine for throat discomfort.    For congestion: take a daily anti-histamine like Zyrtec, Claritin, and a oral decongestant, such as pseudoephedrine.  You can also use Flonase 1-2 sprays in each nostril daily.    It is important to stay hydrated: drink plenty of fluids (water, gatorade/powerade/pedialyte, juices, or teas) to keep your throat moisturized and help further relieve irritation/discomfort.   Return or go to the Emergency Department if symptoms worsen or do not improve in the next few days.      ED Prescriptions   None    PDMP not reviewed this encounter.   Pearson Forster, NP 07/25/21 1037

## 2021-07-25 NOTE — ED Triage Notes (Signed)
Pt presents with chills, headache, nausea, abdominal pain, and sore throat xs 4-5 days.

## 2021-07-25 NOTE — Discharge Instructions (Addendum)
Try a BRAT (bananas, rice, applesause, toast) or bland food diet for the next few days.  Stick with foods that are gentle on your stomach.  Avoid foods that are difficult to digest, such as diary, fried, or spicy foods.  As you feel better, you can start eating like you do normally.    You can use ginger (ginger ale, ginger candy) and mint for nausea.    You can take Tylenol and/or Ibuprofen as needed for fever reduction and pain relief.   For cough: honey 1/2 to 1 teaspoon (you can dilute the honey in water or another fluid).  You can also use guaifenesin and dextromethorphan for cough. You can use a humidifier for chest congestion and cough.  If you don't have a humidifier, you can sit in the bathroom with the hot shower running.     For sore throat: try warm salt water gargles, cepacol lozenges, throat spray, warm tea or water with lemon/honey, popsicles or ice, or OTC cold relief medicine for throat discomfort.    For congestion: take a daily anti-histamine like Zyrtec, Claritin, and a oral decongestant, such as pseudoephedrine.  You can also use Flonase 1-2 sprays in each nostril daily.    It is important to stay hydrated: drink plenty of fluids (water, gatorade/powerade/pedialyte, juices, or teas) to keep your throat moisturized and help further relieve irritation/discomfort.   Return or go to the Emergency Department if symptoms worsen or do not improve in the next few days.

## 2021-07-26 LAB — SARS CORONAVIRUS 2 (TAT 6-24 HRS): SARS Coronavirus 2: NEGATIVE

## 2021-08-09 ENCOUNTER — Other Ambulatory Visit: Payer: Self-pay

## 2021-08-09 ENCOUNTER — Encounter (HOSPITAL_COMMUNITY): Payer: Self-pay | Admitting: *Deleted

## 2021-08-09 ENCOUNTER — Ambulatory Visit (HOSPITAL_COMMUNITY)
Admission: EM | Admit: 2021-08-09 | Discharge: 2021-08-09 | Disposition: A | Discharge status: Home / Self Care | Payer: Medicare HMO | Attending: Family Medicine | Admitting: Family Medicine

## 2021-08-09 DIAGNOSIS — J069 Acute upper respiratory infection, unspecified: Secondary | ICD-10-CM | POA: Diagnosis present

## 2021-08-09 DIAGNOSIS — Z1152 Encounter for screening for COVID-19: Secondary | ICD-10-CM | POA: Diagnosis present

## 2021-08-09 LAB — SARS CORONAVIRUS 2 (TAT 6-24 HRS): SARS Coronavirus 2: NEGATIVE

## 2021-08-09 NOTE — ED Triage Notes (Signed)
Pt requesting Flu and COVID test today . Pt seen for same Sx's I August. Pt reports HA,General aches,Ear pain Lt.

## 2021-08-09 NOTE — ED Provider Notes (Signed)
[place]    CSN: [id number] Arrival date & time: 08/09/21  [id number]      History   Chief Complaint Chief Complaint  Patient presents with   Headache   Generalized Body Aches   Otalgia   Chills    HPI Pamela Roberson is a 65 y.o. female.   Patient presenting today with 4 to 5-day history of headache, generalized body aches, chills, left ear pain, congestion, scratchy throat.  Denies chest pain, shortness of breath, abdominal pain, vomiting, diarrhea but does occasionally have some nausea here and there.  So far not trying anything over-the-counter for symptoms.  States the last time she felt this way she had COVID.  Requesting COVID and flu testing today.  No known sick contacts recently.  Complicated past medical history significant for asthma, COPD, diabetes, multiple heart issues.  Currently has albuterol inhaler, Advair that she takes daily.  Past Medical History:  Diagnosis Date   Arthritis    Asthma    Carotid stenosis, bilateral    Chronic low back pain    COPD (chronic obstructive pulmonary disease) (HCC)    Diabetes mellitus without complication ([place])    type II    Diabetic neuropathy (HCC)    Diverticulitis    Dyspnea    mild    Frequent falls    GERD (gastroesophageal reflux disease)    History of bronchitis    Hypercholesterolemia    Hypertension    IBS (irritable bowel syndrome)    Osteoarthritis    knee   Palpitations    Right ventricular enlargement    per pt report   Sleep apnea    mild but no sleep apnea    TMJ (dislocation of temporomandibular joint)    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    Vitamin D deficiency     Patient Active Problem List   Diagnosis Date Noted   Adenomatous polyp of colon 12/04/2020   Anal fissure 12/04/2020   Chronic idiopathic constipation 12/04/2020   Constipation 12/04/2020   Dysphagia 12/04/2020   Gastroesophageal reflux disease 12/04/2020   Rectal pain 12/04/2020   Right upper quadrant pain  12/04/2020   Dizzy 12/11/2017   Pharyngeal dysphagia 12/11/2017   Post-nasal drip 12/11/2017   Referred ear pain, bilateral 12/11/2017   Diabetes mellitus without complication ([place]) [id number]   Family history of glaucoma 06/29/2017   Hyperopia of both eyes with astigmatism and presbyopia 06/29/2017   Keratoconjunctivitis sicca of both eyes not specified as Sjogren's 06/29/2017   Nuclear sclerotic cataract of both eyes 06/29/2017   Vitreous floater, bilateral 06/29/2017   Biceps tendinitis of right shoulder 04/05/2017   Impingement syndrome of right shoulder 04/05/2017   Arthritis of right acromioclavicular joint 04/05/2017   Partial nontraumatic tear of rotator cuff, right 04/05/2017    Past Surgical History:  Procedure Laterality Date   ABDOMINAL HYSTERECTOMY  1993   CARPAL TUNNEL RELEASE Right    CERVICAL [place] SURGERY  04/29/2015   Dr Maly Sis, [place]   ESOPHAGEAL MANOMETRY N/A 03/03/2021   Procedure: ESOPHAGEAL MANOMETRY (EM);  Surgeon: Wilford Corner, MD;  Location: [facility];  Service: Endoscopy;  Laterality: N/A;   ESOPHAGOGASTRODUODENOSCOPY ENDOSCOPY     with esophagus being stretched twice per pt,    EYE SURGERY     fatty 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      feeding tube placement and removal   2015   NASAL SEPTUM SURGERY     spurs removed  from esophagus   2015   TONSILLECTOMY      OB History   No obstetric history on file.      Home Medications    Prior to Admission medications   Medication Sig Start Date End Date Taking? Authorizing Provider  Acetaminophen-Codeine 300-30 MG tablet  12/19/20   [provider]  acyclovir (ZOVIRAX) 400 MG tablet Take 400 mg by mouth 2 (two) times daily.    [provider]  albuterol (PROAIR HFA) 108 (90 Base) MCG/ACT inhaler Inhale 1-2 puffs into the lungs every 6 (six) hours as needed for wheezing or shortness of breath. 06/14/20   Zigmund Gottron, NP  albuterol (PROVENTIL) (2.5 MG/3ML) 0.083%  nebulizer solution Take 3 mLs (2.5 mg total) by nebulization every 4 (four) hours as needed for wheezing or shortness of breath. 03/11/19   Raylene Everts, MD  Alcohol Swabs (B-D SINGLE USE SWABS REGULAR) PADS Apply topically. 05/16/21   [provider]  ALPRAZolam Duanne Moron) 0.5 MG tablet Take one tab 30-45 minutes prior to MRI, may repeat x 1 if needed before MRI. 05/24/21   Penumalli, Earlean Polka, MD  amoxicillin-clavulanate (AUGMENTIN) 875-125 MG tablet Take 1 tablet by mouth every 12 (twelve) hours. 05/21/21   Hughie Closs, PA-C  Artificial Tear Ointment (DRY EYES OP) Place 1-2 drops into both eyes daily as needed (for dry eyes).     [provider]  Ascorbic Acid (VITAMIN C) 1000 MG tablet Take 1,000 mg by mouth daily.    [provider]  atorvastatin (LIPITOR) 10 MG tablet Take 10 mg by mouth daily.    [provider]  cholecalciferol (VITAMIN D3) 25 MCG (1000 UNIT) tablet Take 2,000 Units by mouth daily.     [provider]  cyclobenzaprine (FLEXERIL) 10 MG tablet Take 1 tablet (10 mg total) by mouth 2 (two) times daily as needed for muscle spasms. 04/19/21   Domenic Moras, PA-C  cycloSPORINE (RESTASIS) 0.05 % ophthalmic emulsion Place 1 drop into both eyes 2 times daily. 02/02/21   [provider]  dicyclomine (BENTYL) 20 MG tablet Take 20 mg by mouth 3 (three) times daily. 10/17/19   [provider]  EPINEPHrine 0.1 MG/0.1ML SOAJ Inject as directed See admin instructions.    [provider]  fexofenadine (ALLEGRA) 180 MG tablet  12/24/20   [provider]  fluticasone (FLONASE) 50 MCG/ACT nasal spray Place 1 spray into both nostrils daily as needed for allergies or rhinitis.    [provider]  fluticasone-salmeterol (ADVAIR HFA) 115-21 MCG/ACT inhaler Inhale 2 puffs into the lungs 2 (two) times daily. 01/07/21   Kennith Gain, MD  gabapentin (NEURONTIN) 300 MG capsule Take 300 mg by mouth 3 (three)  times daily.    [provider]  guaiFENesin (MUCINEX) 600 MG 12 hr tablet Take 2 tablets (1,200 mg total) by mouth 2 (two) times daily as needed. 01/06/21   Sharion Balloon, NP  HYDROcodone-Acetaminophen 10-325 MG/15ML SOLN Take by mouth.    [provider]  ibuprofen (ADVIL) 600 MG tablet Take 1 tablet (600 mg total) by mouth every 6 (six) hours as needed for moderate pain. 04/19/21   Domenic Moras, PA-C  ipratropium (ATROVENT) 0.06 % nasal spray Place 2 sprays into both nostrils in the morning and at bedtime. 01/07/21   Kennith Gain, MD  JANUMET XR 50-500 MG TB24 Take 1 tablet by mouth daily. 09/27/20   [provider]  ketorolac (ACULAR) 0.5 % ophthalmic  solution One drop in operative eye(s) TID starting 2 days before surgery and continuing 3 weeks after surgery. 02/02/21   [provider]  metoprolol succinate (TOPROL-XL) 25 MG 24 hr tablet Take by mouth.    [provider]  metoprolol tartrate (LOPRESSOR) 25 MG tablet Take 25 mg by mouth 2 (two) times daily.    [provider]  montelukast (SINGULAIR) 10 MG tablet Take 10 mg by mouth daily.     [provider]  Multiple Vitamin (MULTIVITAMIN WITH MINERALS) TABS tablet Take 1 tablet by mouth daily.    [provider]  nystatin cream (MYCOSTATIN) Apply 1 application topically 2 (two) times daily. 03/18/21   [provider]  ofloxacin (OCUFLOX) 0.3 % ophthalmic solution Instill 1 drop TID in operative eye(s) starting 2 days prior to surgery and after surgery for 3 weeks. 02/02/21   [provider]  ondansetron (ZOFRAN) 4 MG tablet Take 4 mg by mouth every 8 (eight) hours as needed. 02/15/21   [provider]  ondansetron (ZOFRAN-ODT) 8 MG disintegrating tablet Take 1 tablet (8 mg total) by mouth every 8 (eight) hours as needed for nausea or vomiting. 08/28/20   Jaynee Eagles, PA-C  pantoprazole (PROTONIX) 40 MG tablet Take 40 mg by mouth 2 (two) times  daily.  11/20/19   [provider]  PANTOPRAZOLE SODIUM PO Take by mouth.    [provider]  prednisoLONE acetate (PRED FORTE) 1 % ophthalmic suspension 1 drop into affected eye 02/02/21   [provider]  predniSONE (DELTASONE) 20 MG tablet 3 tabs po day one, then 2 po daily x 4 days 04/19/21   Domenic Moras, PA-C  promethazine-dextromethorphan (PROMETHAZINE-DM) 6.25-15 MG/5ML syrup Take 2.5 mLs by mouth 4 (four) times daily as needed for cough. 04/06/21   White, Leitha Schuller, NP  sucralfate (CARAFATE) 1 g tablet Take 1 g by mouth 4 (four) times daily.  02/11/20   [provider]  esomeprazole (NEXIUM) 40 MG capsule Take 1 capsule (40 mg total) by mouth daily. Patient not taking: Reported on 07/08/2020 04/24/19 08/06/20  Charlesetta Shanks, MD    Family History Family History  Problem Relation Age of Onset   Allergic rhinitis Mother    Diabetes Father        type 2   Heart disease Father    Hypertension Father    Allergic rhinitis Father    Heart disease Brother    Hypertension Brother    Allergic rhinitis Sister    Angioedema Neg Hx    Asthma Neg Hx    Atopy Neg Hx    Eczema Neg Hx    Immunodeficiency Neg Hx    Urticaria Neg Hx     Social History Social History   Tobacco Use   Smoking status: Former    Packs/day: 0.50    Years: 30.00    Pack years: 15.00    Types: Cigarettes    Quit date: 12/08/1994    Years since quitting: 26.6   Smokeless tobacco: Never  Vaping Use   Vaping Use: Never used  Substance Use Topics   Alcohol use: No   Drug use: No     Allergies   Crab [shellfish allergy], Erythromycin, Hm lidocaine patch [lidocaine], Erdafitinib, Fish allergy, Metoclopramide, Rosanil cleanser [sulfacetamide sodium-sulfur], Sulfacetamide, Doxycycline, Levaquin [levofloxacin in d5w], Morphine, Morphine and related, and Sulfa antibiotics   Review of Systems Review of Systems Per HPI  Physical Exam Triage Vital Signs ED Triage Vitals  Enc  Vitals Group     BP 08/09/21 1030 (!) 145/70     Pulse Rate 08/09/21 1030 69     Resp 08/09/21 1030 20     Temp 08/09/21 1030 98.4 F (36.9 C)     Temp src --      SpO2 08/09/21 1030 100 %     Weight --      Height --      Head Circumference --      [place] Flow --      Pain Score 08/09/21 1032 8     Pain Loc --      Pain Edu? --      Excl. in [place]? --    No data found.  Updated Vital Signs BP (!) 145/70   Pulse 69   Temp 98.4 F (36.9 C)   Resp 20   SpO2 100%   Visual Acuity Right Eye Distance:   Left Eye Distance:   Bilateral Distance:    Right Eye Near:   Left Eye Near:    Bilateral Near:     Physical Exam Vitals and nursing note reviewed.  Constitutional:      Appearance: Normal appearance. She is not ill-appearing.  HENT:     Head: Atraumatic.     Ears:     Comments: Bilateral middle ear effusion    Nose: Rhinorrhea present.     Mouth/Throat:     Mouth: Mucous membranes are moist.     Pharynx: Oropharynx is clear. Posterior oropharyngeal erythema present. No oropharyngeal exudate.  Eyes:     Extraocular Movements: Extraocular movements intact.     Conjunctiva/sclera: Conjunctivae normal.  Cardiovascular:     Rate and Rhythm: Normal rate and regular rhythm.     Heart sounds: Normal heart sounds.  Pulmonary:     Effort: Pulmonary effort is normal.     Breath sounds: Normal breath sounds. No wheezing or rales.  Abdominal:     General: Bowel sounds are normal. There is no distension.     Palpations: Abdomen is soft.     Tenderness: There is no abdominal tenderness. There is no guarding.  Musculoskeletal:        General: Normal range of motion.     Cervical back: Normal range of motion and neck supple.  Skin:    General: Skin is warm and dry.  Neurological:     Mental Status: She is alert and oriented to person, place, and time.  Psychiatric:        Mood and Affect: Mood normal.        Thought Content: Thought content normal.        Judgment: Judgment  normal.   UC Treatments / Results  Labs (all labs ordered are listed, but only abnormal results are displayed) Labs Reviewed  SARS CORONAVIRUS 2 (TAT 6-24 HRS)    EKG   Radiology No results found.  Procedures Procedures (including critical care time)  Medications Ordered in UC Medications - No data to display  Initial Impression / Assessment and Plan / UC Course  I have reviewed the triage vital signs and the nursing notes.  Pertinent labs & imaging results that were available during my care of the patient were reviewed by me and considered in my medical decision making (see chart for details).     Overall vital signs and exam reassuring, suspect viral illness, possibly COVID.  COVID PCR pending, she declines flu testing as this may prolong her time in department and  she is "ready to go home ".  She states she has Mucinex at home, has many allergies so does not like to take other medications for symptoms.  Discussed supportive home care, strict return precautions.  Quarantine protocol reviewed.  Final Clinical Impressions(s) / UC Diagnoses   Final diagnoses:  Viral URI with cough  Encounter for screening for COVID-19   Discharge Instructions   None    ED Prescriptions   None    PDMP not reviewed this encounter.   Volney American, [place] 08/09/21 1142

## 2021-09-23 ENCOUNTER — Other Ambulatory Visit: Payer: Self-pay

## 2021-09-23 ENCOUNTER — Ambulatory Visit (INDEPENDENT_AMBULATORY_CARE_PROVIDER_SITE_OTHER): Payer: Medicare HMO | Admitting: Allergy

## 2021-09-23 ENCOUNTER — Encounter: Payer: Self-pay | Admitting: Allergy

## 2021-09-23 VITALS — BP 128/80 | HR 81 | Temp 97.8°F | Resp 16 | Ht 67.0 in | Wt 205.8 lb

## 2021-09-23 DIAGNOSIS — T7800XD Anaphylactic reaction due to unspecified food, subsequent encounter: Secondary | ICD-10-CM

## 2021-09-23 DIAGNOSIS — H1013 Acute atopic conjunctivitis, bilateral: Secondary | ICD-10-CM | POA: Diagnosis not present

## 2021-09-23 DIAGNOSIS — K2100 Gastro-esophageal reflux disease with esophagitis, without bleeding: Secondary | ICD-10-CM

## 2021-09-23 DIAGNOSIS — T50905D Adverse effect of unspecified drugs, medicaments and biological substances, subsequent encounter: Secondary | ICD-10-CM

## 2021-09-23 DIAGNOSIS — J4540 Moderate persistent asthma, uncomplicated: Secondary | ICD-10-CM | POA: Diagnosis not present

## 2021-09-23 DIAGNOSIS — J3089 Other allergic rhinitis: Secondary | ICD-10-CM | POA: Diagnosis not present

## 2021-09-23 MED ORDER — ADVAIR HFA 115-21 MCG/ACT IN AERO: 2.0000 | INHALATION_SPRAY | Freq: Two times a day (BID) | RESPIRATORY_TRACT | 5 refills | Status: DC

## 2021-09-23 NOTE — Progress Notes (Signed)
Follow-up Note  RE: Pamela Roberson MRN: [id number] DOB: Jun 03, 1956 Date of Office Visit: 09/23/2021   History of present illness: Pamela Roberson is a 65 y.o. female presenting today for follow-up of allergic rhinitis, asthma, food allergy and reflux.  She was last seen in the office on 01/07/2021 by myself. She wants to discuss allergy shots today.  She states she has been having more nasal congestion, throat clearing, itchy/watery eyes.  She changed from allegra to claritin but states that is not helping.  She has not tried Xyzal yet that was recommended at the last visit.  She states she is using nasal atrovent.   In regards to her asthma she is using Wixela 2 puffs twice a day.  She just recently ran out.  She did use her albuterol recently once within the past month.  She does not report any ED or urgent care visits for asthma symptoms or any systemic steroid needs. She would like to do a fish and shellfish challenge however she states that she has some issues with childcare.  She normally has her grandchild that she cares for and does not think she will have time to come for [place] where she does not have her grandchild.  She says she has some family members she has to see if they can keep the child so she can come do a challenge.  She would like to eat fish if able. She continues to take Protonix daily and states this definitely helps her reflux control.  Review of systems: Review of Systems  Constitutional: Negative.   HENT:  Positive for congestion.   Eyes:        Watery eyes  Respiratory: Negative.    Cardiovascular: Negative.   Gastrointestinal: Negative.   Musculoskeletal: Negative.   Skin: Negative.   Neurological: Negative.    All other systems negative unless noted above in HPI  Past medical/social/surgical/family history have been reviewed and are unchanged unless specifically indicated below.  No changes  Medication List: Current Outpatient Medications   Medication Sig Dispense Refill   Acetaminophen-Codeine 300-30 MG tablet      acyclovir (ZOVIRAX) 400 MG tablet Take 400 mg by mouth 2 (two) times daily.     albuterol (PROAIR HFA) 108 (90 Base) MCG/ACT inhaler Inhale 1-2 puffs into the lungs every 6 (six) hours as needed for wheezing or shortness of breath. 8 g 0   albuterol (PROVENTIL) (2.5 MG/3ML) 0.083% nebulizer solution Take 3 mLs (2.5 mg total) by nebulization every 4 (four) hours as needed for wheezing or shortness of breath. 30 vial 0   Alcohol Swabs (B-D SINGLE USE SWABS REGULAR) PADS Apply topically.     ALPRAZolam (XANAX) 0.5 MG tablet Take one tab 30-45 minutes prior to MRI, may repeat x 1 if needed before MRI. 2 tablet 0   Artificial Tear Ointment (DRY EYES OP) Place 1-2 drops into both eyes daily as needed (for dry eyes).      Ascorbic Acid (VITAMIN C) 1000 MG tablet Take 1,000 mg by mouth daily.     atorvastatin (LIPITOR) 10 MG tablet Take 10 mg by mouth daily.     cholecalciferol (VITAMIN D3) 25 MCG (1000 UNIT) tablet Take 2,000 Units by mouth daily.      cyclobenzaprine (FLEXERIL) 10 MG tablet Take 1 tablet (10 mg total) by mouth 2 (two) times daily as needed for muscle spasms. 20 tablet 0   cycloSPORINE (RESTASIS) 0.05 % ophthalmic emulsion Place 1 drop into both  eyes 2 times daily.     dicyclomine (BENTYL) 20 MG tablet Take 20 mg by mouth 3 (three) times daily.     EPINEPHrine 0.1 MG/0.1ML SOAJ Inject as directed See admin instructions.     fexofenadine (ALLEGRA) 180 MG tablet      fluticasone (FLONASE) 50 MCG/ACT nasal spray Place 1 spray into both nostrils daily as needed for allergies or rhinitis.     gabapentin (NEURONTIN) 300 MG capsule Take 300 mg by mouth 3 (three) times daily.     ipratropium (ATROVENT) 0.06 % nasal spray Place 2 sprays into both nostrils in the morning and at bedtime. 15 mL 5   JANUMET XR 50-500 MG TB24 Take 1 tablet by mouth daily.     ketorolac (ACULAR) 0.5 % ophthalmic solution One drop in  operative eye(s) TID starting 2 days before surgery and continuing 3 weeks after surgery.     metoprolol succinate (TOPROL-XL) 25 MG 24 hr tablet Take by mouth.     metoprolol tartrate (LOPRESSOR) 25 MG tablet Take 25 mg by mouth 2 (two) times daily.     montelukast (SINGULAIR) 10 MG tablet Take 10 mg by mouth daily.      Multiple Vitamin (MULTIVITAMIN WITH MINERALS) TABS tablet Take 1 tablet by mouth daily.     nystatin cream (MYCOSTATIN) Apply 1 application topically 2 (two) times daily.     ondansetron (ZOFRAN) 4 MG tablet Take 4 mg by mouth every 8 (eight) hours as needed.     ondansetron (ZOFRAN-ODT) 8 MG disintegrating tablet Take 1 tablet (8 mg total) by mouth every 8 (eight) hours as needed for nausea or vomiting. 20 tablet 0   pantoprazole (PROTONIX) 40 MG tablet Take 40 mg by mouth 2 (two) times daily.      sucralfate (CARAFATE) 1 g tablet Take 1 g by mouth 4 (four) times daily.      fluticasone-salmeterol (ADVAIR HFA) 115-21 MCG/ACT inhaler Inhale 2 puffs into the lungs 2 (two) times daily. 1 each 5   No current facility-administered medications for this visit.     Known medication allergies: Allergies  Allergen Reactions   Crab [Shellfish Allergy] Shortness Of Breath and Swelling   Erythromycin Swelling and Rash   Hm Lidocaine Patch [Lidocaine]     Burning on skin   Erdafitinib     Other reaction(s): pt not sure   Fish Allergy     Swelling    Metoclopramide Other (See Comments)    Slurred speech and unable to move muscles,  Caused her to drag her feet   Rosanil Cleanser [Sulfacetamide Sodium-Sulfur]     Other reaction(s): itching, rash   Sulfacetamide     Other reaction(s): itching, rash   Doxycycline     GI Intolerance   Levaquin [Levofloxacin In D5w] Other (See Comments)    Causes irregular heart beat   Morphine Nausea And Vomiting and Rash    Irregular heart beat   Morphine And Related Nausea And Vomiting and Rash    Irregular heart beat   Sulfa Antibiotics Rash      Physical examination: Blood pressure 128/80, pulse 81, temperature 97.8 F (36.6 C), resp. rate 16, height 5\' 7"  (1.702 m), weight 205 lb 12.8 oz (93.4 kg), SpO2 99 %.  General: Alert, interactive, in no acute distress. HEENT: PERRLA, TMs pearly gray, turbinates minimally edematous without discharge, post-pharynx non erythematous. Neck: Supple without lymphadenopathy. Lungs: [place] finish. {no increased work of breathing. CV: Normal S1, S2 without murmurs. Abdomen: Nondistended,  nontender. Skin: Warm and dry, without lesions or rashes. Extremities:  No clubbing, cyanosis or edema. Neuro:   Grossly intact.  Diagnositics/Labs: Component     Latest Ref Rng & Units 01/07/2021  Codfish IgE     Class 0 kU/L <0.10  Halibut IgE     Class 0 kU/L <0.10  Allergen [place] IgE     Class 0 kU/L <0.10  Tuna     Class 0 kU/L <0.10  Allergen Salmon IgE     Class 0 kU/L <0.10  Allergen Mackerel IgE     Class 0 kU/L <0.10  Allergen Trout IgE     Class 0 kU/L <0.10  Clam IgE     Class 0 kU/L <0.10  F023-IgE Crab     Class 0 kU/L <0.10  Shrimp IgE     Class 0 kU/L <0.10  Scallop IgE     Class 0 kU/L <0.10  F290-IgE Oyster     Class 0 kU/L <0.10  F080-IgE Lobster     Class 0 kU/L <0.10    Assessment and plan: Allergic rhinitis with conjunctivitis  -continue avoidance measures for grass pollen and tobacco leaf -allergen avoidance measures discussed/handouts provided -start Xyzal 5mg  daily.  This is a long-acting antihistamine to replace Claritin.  Samples provided -continue nasal atrovent 2 sprays each nostril twice a day for nasal congestion and drainage control  Moderate persistent asthma -continue Singulair 10mg  daily at bedtime -continue Advair (Wixela) 192mcg/21 take 2 puffs twice a day. Will refill this for you -have access to albuterol inhaler 2 puffs or albuterol 1 vial via nebulizer every 4-6 hours as needed for cough/wheeze/shortness of breath/chest tightness.   May use 15-20 minutes prior to activity.   Monitor frequency of use.    Anaphylaxis due to food -food allergy testing both skin and blood work is negative for fish and shellfish -recommend you schedule for food challenge to [facility] and then can schedule for shellfish challenge - have access to self-injectable epinephrine Epipen 0.3mg  at all times - follow emergency action plan in case of allergic reaction  GERD -continue lifestyle modifications for reflux control (like propping up head of bed and minimizing foods that cause heartburn) -continue protonix daily  Adverse effect of medication -continue your current medication avoidance -will address your medication allergies at future visit  Follow-up in 4-6 months or sooner if needed  I appreciate the opportunity to take part in Pamela Roberson's care. Please do not hesitate to contact me with questions.  Sincerely,   Prudy Feeler, MD Allergy/Immunology [facility]

## 2021-09-23 NOTE — Patient Instructions (Addendum)
-  continue avoidance measures for grass pollen and tobacco leaf -allergen avoidance measures discussed/handouts provided -start Xyzal 5mg  daily.  This is a long-acting antihistamine to replace Claritin.  Samples provided -continue nasal atrovent 2 sprays each nostril twice a day for nasal congestion and drainage control  -continue Singulair 10mg  daily at bedtime -continue Advair (Wixela) 126mcg/21 take 2 puffs twice a day. Will refill this for you -have access to albuterol inhaler 2 puffs or albuterol 1 vial via nebulizer every 4-6 hours as needed for cough/wheeze/shortness of breath/chest tightness.  May use 15-20 minutes prior to activity.   Monitor frequency of use.    -food allergy testing both skin and blood work is negative for fish and shellfish -recommend you schedule for food challenge to [facility] and then can schedule for shellfish challenge - have access to self-injectable epinephrine Epipen 0.3mg  at all times - follow emergency action plan in case of allergic reaction  -continue lifestyle modifications for reflux control (like propping up head of bed and minimizing foods that cause heartburn) -continue protonix daily  -continue your current medication avoidance -will address your medication allergies at future visit  Follow-up in 4-6 months or sooner if needed

## 2021-10-02 ENCOUNTER — Other Ambulatory Visit: Payer: Self-pay

## 2021-10-02 ENCOUNTER — Encounter (HOSPITAL_COMMUNITY): Payer: Self-pay | Admitting: *Deleted

## 2021-10-02 ENCOUNTER — Ambulatory Visit (HOSPITAL_COMMUNITY)
Admission: EM | Admit: 2021-10-02 | Discharge: 2021-10-02 | Disposition: A | Discharge status: Home / Self Care | Payer: Medicare HMO | Attending: Emergency Medicine | Admitting: Emergency Medicine

## 2021-10-02 DIAGNOSIS — R531 Weakness: Secondary | ICD-10-CM | POA: Insufficient documentation

## 2021-10-02 DIAGNOSIS — J4541 Moderate persistent asthma with (acute) exacerbation: Secondary | ICD-10-CM | POA: Diagnosis not present

## 2021-10-02 DIAGNOSIS — R0689 Other abnormalities of breathing: Secondary | ICD-10-CM | POA: Insufficient documentation

## 2021-10-02 DIAGNOSIS — J3089 Other allergic rhinitis: Secondary | ICD-10-CM | POA: Diagnosis not present

## 2021-10-02 DIAGNOSIS — Z20822 Contact with and (suspected) exposure to covid-19: Secondary | ICD-10-CM | POA: Diagnosis not present

## 2021-10-02 DIAGNOSIS — J069 Acute upper respiratory infection, unspecified: Secondary | ICD-10-CM | POA: Diagnosis present

## 2021-10-02 LAB — POC INFLUENZA A AND B ANTIGEN (URGENT CARE ONLY)
INFLUENZA A ANTIGEN, POC: NEGATIVE
INFLUENZA B ANTIGEN, POC: NEGATIVE

## 2021-10-02 MED ORDER — METHYLPREDNISOLONE SODIUM SUCC 125 MG IJ SOLR
INTRAMUSCULAR | Status: AC
  Filled 2021-10-02: qty 2

## 2021-10-02 MED ORDER — METHYLPREDNISOLONE SODIUM SUCC 125 MG IJ SOLR
125.0000 mg | Freq: Once | INTRAMUSCULAR | Status: AC
  Administered 2021-10-02: 125 mg via INTRAMUSCULAR

## 2021-10-02 MED ORDER — METHYLPREDNISOLONE 4 MG PO TBPK: ORAL_TABLET | ORAL | 0 refills | Status: DC

## 2021-10-02 NOTE — ED Provider Notes (Signed)
[place]    CSN: [id number] Arrival date & time: 10/02/21  1158      History   Chief Complaint Chief Complaint  Patient presents with   Cough   Fatigue    HPI Pamela Roberson is a 65 y.o. female.   Patient reports 3-day history of feeling short of breath, fatigue and having a dry nonproductive cough.  She reports a history of asthma and significant allergies, states she is allergic to dogs, grass, cats, foods, states she is currently seeing an allergist regularly and about to begin desensitization therapy.  Patient states she ate a cupcake last night and immediately felt her throat close up and became "croupy".  Patient states she is here today to see if she can get some prednisone.  The history is provided by the patient.   Past Medical History:  Diagnosis Date   Arthritis    Asthma    Carotid stenosis, bilateral    Chronic low back pain    COPD (chronic obstructive pulmonary disease) (HCC)    Diabetes mellitus without complication ([place])    type II    Diabetic neuropathy (HCC)    Diverticulitis    Dyspnea    mild    Frequent falls    GERD (gastroesophageal reflux disease)    History of bronchitis    Hypercholesterolemia    Hypertension    IBS (irritable bowel syndrome)    Osteoarthritis    knee   Palpitations    Right ventricular enlargement    per pt report   Sleep apnea    mild but no sleep apnea    TMJ (dislocation of temporomandibular joint)    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    Vitamin D deficiency     Patient Active Problem List   Diagnosis Date Noted   Adenomatous polyp of colon 12/04/2020   Anal fissure 12/04/2020   Chronic idiopathic constipation 12/04/2020   Constipation 12/04/2020   Dysphagia 12/04/2020   Gastroesophageal reflux disease 12/04/2020   Rectal pain 12/04/2020   Right upper quadrant pain 12/04/2020   Dizzy 12/11/2017   Pharyngeal dysphagia 12/11/2017   Post-nasal drip 12/11/2017   Referred ear  pain, bilateral 12/11/2017   Diabetes mellitus without complication ([place]) 59/16/3846   Family history of glaucoma 06/29/2017   Hyperopia of both eyes with astigmatism and presbyopia 06/29/2017   Keratoconjunctivitis sicca of both eyes not specified as Sjogren's 06/29/2017   Nuclear sclerotic cataract of both eyes 06/29/2017   Vitreous floater, bilateral 06/29/2017   Biceps tendinitis of right shoulder 04/05/2017   Impingement syndrome of right shoulder 04/05/2017   Arthritis of right acromioclavicular joint 04/05/2017   Partial nontraumatic tear of rotator cuff, right 04/05/2017    Past Surgical History:  Procedure Laterality Date   ABDOMINAL HYSTERECTOMY  1993   CARPAL TUNNEL RELEASE Right    CERVICAL [place] SURGERY  04/29/2015   Dr Nubia Sis, [place]   ESOPHAGEAL MANOMETRY N/A 03/03/2021   Procedure: ESOPHAGEAL MANOMETRY (EM);  Surgeon: Wilford Corner, MD;  Location: [facility];  Service: Endoscopy;  Laterality: N/A;   ESOPHAGOGASTRODUODENOSCOPY ENDOSCOPY     with esophagus being stretched twice per pt,    EYE SURGERY     fatty 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      feeding tube placement and removal   2015   NASAL SEPTUM SURGERY     spurs removed from esophagus   2015   TONSILLECTOMY  OB History   No obstetric history on file.      Home Medications    Prior to Admission medications   Medication Sig Start Date End Date Taking? Authorizing Provider  methylPREDNISolone (MEDROL DOSEPAK) 4 MG TBPK tablet Take 24 mg on day 1, 20 mg on day 2, 16 mg on day 3, 12 mg on day 4, 8 mg on day 5, 4 mg on day 6. 10/02/21  Yes Lynden Oxford Scales, PA-C  Acetaminophen-Codeine 300-30 MG tablet  12/19/20   [provider]  acyclovir (ZOVIRAX) 400 MG tablet Take 400 mg by mouth 2 (two) times daily.    [provider]  albuterol (PROAIR HFA) 108 (90 Base) MCG/ACT inhaler Inhale 1-2 puffs into the lungs every 6 (six) hours as needed for wheezing or shortness of  breath. 06/14/20   Zigmund Gottron, NP  albuterol (PROVENTIL) (2.5 MG/3ML) 0.083% nebulizer solution Take 3 mLs (2.5 mg total) by nebulization every 4 (four) hours as needed for wheezing or shortness of breath. 03/11/19   Raylene Everts, MD  Alcohol Swabs (B-D SINGLE USE SWABS REGULAR) PADS Apply topically. 05/16/21   [provider]  ALPRAZolam Duanne Moron) 0.5 MG tablet Take one tab 30-45 minutes prior to MRI, may repeat x 1 if needed before MRI. 05/24/21   Penumalli, Earlean Polka, MD  Artificial Tear Ointment (DRY EYES OP) Place 1-2 drops into both eyes daily as needed (for dry eyes).     [provider]  Ascorbic Acid (VITAMIN C) 1000 MG tablet Take 1,000 mg by mouth daily.    [provider]  atorvastatin (LIPITOR) 10 MG tablet Take 10 mg by mouth daily.    [provider]  cholecalciferol (VITAMIN D3) 25 MCG (1000 UNIT) tablet Take 2,000 Units by mouth daily.     [provider]  cyclobenzaprine (FLEXERIL) 10 MG tablet Take 1 tablet (10 mg total) by mouth 2 (two) times daily as needed for muscle spasms. 04/19/21   Domenic Moras, PA-C  cycloSPORINE (RESTASIS) 0.05 % ophthalmic emulsion Place 1 drop into both eyes 2 times daily. 02/02/21   [provider]  dicyclomine (BENTYL) 20 MG tablet Take 20 mg by mouth 3 (three) times daily. 10/17/19   [provider]  EPINEPHrine 0.1 MG/0.1ML SOAJ Inject as directed See admin instructions.    [provider]  fexofenadine (ALLEGRA) 180 MG tablet  12/24/20   [provider]  fluticasone (FLONASE) 50 MCG/ACT nasal spray Place 1 spray into both nostrils daily as needed for allergies or rhinitis.    [provider]  fluticasone-salmeterol (ADVAIR HFA) 115-21 MCG/ACT inhaler Inhale 2 puffs into the lungs 2 (two) times daily. 09/23/21   Kennith Gain, MD  gabapentin (NEURONTIN) 300 MG capsule Take 300 mg by mouth 3 (three) times daily.    [provider]  ipratropium  (ATROVENT) 0.06 % nasal spray Place 2 sprays into both nostrils in the morning and at bedtime. 01/07/21   Kennith Gain, MD  JANUMET XR 50-500 MG TB24 Take 1 tablet by mouth daily. 09/27/20   [provider]  ketorolac (ACULAR) 0.5 % ophthalmic solution One drop in operative eye(s) TID starting 2 days before surgery and continuing 3 weeks after surgery. 02/02/21   [provider]  metoprolol succinate (TOPROL-XL) 25 MG 24 hr tablet Take by mouth.    [provider]  metoprolol tartrate (LOPRESSOR) 25 MG tablet Take 25 mg by mouth 2 (two) times daily.  [provider]  montelukast (SINGULAIR) 10 MG tablet Take 10 mg by mouth daily.     [provider]  Multiple Vitamin (MULTIVITAMIN WITH MINERALS) TABS tablet Take 1 tablet by mouth daily.    [provider]  nystatin cream (MYCOSTATIN) Apply 1 application topically 2 (two) times daily. 03/18/21   [provider]  ondansetron (ZOFRAN) 4 MG tablet Take 4 mg by mouth every 8 (eight) hours as needed. 02/15/21   [provider]  ondansetron (ZOFRAN-ODT) 8 MG disintegrating tablet Take 1 tablet (8 mg total) by mouth every 8 (eight) hours as needed for nausea or vomiting. 08/28/20   Jaynee Eagles, PA-C  pantoprazole (PROTONIX) 40 MG tablet Take 40 mg by mouth 2 (two) times daily.  11/20/19   [provider]  sucralfate (CARAFATE) 1 g tablet Take 1 g by mouth 4 (four) times daily.  02/11/20   [provider]  esomeprazole (NEXIUM) 40 MG capsule Take 1 capsule (40 mg total) by mouth daily. Patient not taking: Reported on 07/08/2020 04/24/19 08/06/20  Charlesetta Shanks, MD    Family History Family History  Problem Relation Age of Onset   Allergic rhinitis Mother    Diabetes Father        type 2   Heart disease Father    Hypertension Father    Allergic rhinitis Father    Heart disease Brother    Hypertension Brother    Allergic rhinitis Sister    Angioedema Neg Hx     Asthma Neg Hx    Atopy Neg Hx    Eczema Neg Hx    Immunodeficiency Neg Hx    Urticaria Neg Hx     Social History Social History   Tobacco Use   Smoking status: Former    Packs/day: 0.50    Years: 30.00    Pack years: 15.00    Types: Cigarettes    Quit date: 12/08/1994    Years since quitting: 26.8   Smokeless tobacco: Never  Vaping Use   Vaping Use: Never used  Substance Use Topics   Alcohol use: No   Drug use: No     Allergies   Crab [shellfish allergy], Erythromycin, Hm lidocaine patch [lidocaine], Erdafitinib, Fish allergy, Metoclopramide, Rosanil cleanser [sulfacetamide sodium-sulfur], Sulfacetamide, Doxycycline, Levaquin [levofloxacin in d5w], Morphine, Morphine and related, and Sulfa antibiotics   Review of Systems Review of Systems Pertinent findings noted in history of present illness.    Physical Exam Triage Vital Signs ED Triage Vitals  Enc Vitals Group     BP 10/01/21 0827 (!) 147/82     Pulse Rate 10/01/21 0827 72     Resp 10/01/21 0827 18     Temp 10/01/21 0827 98.3 F (36.8 C)     Temp Source 10/01/21 0827 Oral     SpO2 10/01/21 0827 98 %     Weight --      Height --      Head Circumference --      Peak Flow --      Pain Score 10/01/21 0826 5     Pain Loc --      Pain Edu? --      Excl. in [place]? --    No data found.  Updated Vital Signs BP 115/75   Pulse 82   Temp 98 F (36.7 C)   Resp 18   SpO2 96%   Visual Acuity Right Eye Distance:   Left Eye Distance:   Bilateral Distance:  Right Eye Near:   Left Eye Near:    Bilateral Near:     Physical Exam Vitals and nursing note reviewed.  Constitutional:      General: She is not in acute distress.    Appearance: Normal appearance. She is not ill-appearing.  HENT:     Head: Normocephalic and atraumatic.     Salivary Glands: Right salivary gland is not diffusely enlarged or tender. Left salivary gland is not diffusely enlarged or tender.     Right Ear: Ear canal and external ear  normal. No drainage. A middle ear effusion (clear fluid) is present. Tympanic membrane is bulging. Tympanic membrane is not erythematous.     Left Ear: Ear canal and external ear normal. No drainage. A middle ear effusion (clear fluid) is present. Tympanic membrane is bulging. Tympanic membrane is not erythematous.     Nose: Mucosal edema, congestion and rhinorrhea present. No nasal deformity or septal deviation. Rhinorrhea is clear.     Right Turbinates: Enlarged, swollen and pale.     Left Turbinates: Enlarged, swollen and pale.     Right Sinus: No maxillary sinus tenderness or frontal sinus tenderness.     Left Sinus: No maxillary sinus tenderness or frontal sinus tenderness.     Mouth/Throat:     Lips: Pink. No lesions.     Mouth: Mucous membranes are moist. No oral lesions.     Pharynx: Oropharynx is clear. Uvula midline. No posterior oropharyngeal erythema or uvula swelling.     Tonsils: No tonsillar exudate. 0 on the right. 0 on the left.  Eyes:     General: Lids are normal.        Right eye: No discharge.        Left eye: No discharge.     Extraocular Movements: Extraocular movements intact.     Conjunctiva/sclera: Conjunctivae normal.     Right eye: Right conjunctiva is not injected.     Left eye: Left conjunctiva is not injected.  Neck:     Trachea: Trachea and phonation normal.  Cardiovascular:     Rate and Rhythm: Normal rate and regular rhythm.     Pulses: Normal pulses.     Heart sounds: Normal heart sounds. No murmur heard.   No friction rub. No gallop.  Pulmonary:     Effort: Pulmonary effort is normal. No accessory muscle usage, prolonged expiration or respiratory distress.     Breath sounds: No stridor, decreased air movement or transmitted upper airway sounds. Examination of the right-upper field reveals decreased breath sounds. Examination of the right-middle field reveals decreased breath sounds. Examination of the left-middle field reveals decreased breath sounds.  Examination of the right-lower field reveals decreased breath sounds. Decreased breath sounds present. No wheezing, rhonchi or rales.  Chest:     Chest wall: No tenderness.  Musculoskeletal:        General: Normal range of motion.     Cervical back: Normal range of motion and neck supple. Normal range of motion.  Lymphadenopathy:     Cervical: No cervical adenopathy.  Skin:    General: Skin is warm and dry.     Findings: No erythema or rash.  Neurological:     General: No focal deficit present.     Mental Status: She is alert and oriented to person, place, and time.  Psychiatric:        Mood and Affect: Mood normal.        Behavior: Behavior normal.  UC Treatments / Results  Labs (all labs ordered are listed, but only abnormal results are displayed) Labs Reviewed  SARS CORONAVIRUS 2 (TAT 6-24 HRS)  POC INFLUENZA A AND B ANTIGEN (URGENT CARE ONLY)    EKG   Radiology No results found.  Procedures Procedures (including critical care time)  Medications Ordered in UC Medications  methylPREDNISolone sodium succinate (SOLU-MEDROL) 125 mg/2 mL injection 125 mg (has no administration in time range)    Initial Impression / Assessment and Plan / UC Course  I have reviewed the triage vital signs and the nursing notes.  Pertinent labs & imaging results that were available during my care of the patient were reviewed by me and considered in my medical decision making (see chart for details).     Asthma exacerbation in the setting of extensive environmental, seasonal and food allergies.  Patient states he has 2 back that began at home, states she was disinclined to use it last night for reasons not made fully clear.  Patient encouraged to use EpiPen freely and to always call 911 when doing so.  Patient states she will discuss this episode with her allergist at their upcoming visit.  Patient was provided with a methylprednisolone injection in clinic today as well as a prescription  for Medrol Dosepak.  I encouraged her to continue using all of her inhalers and allergy medications as previously prescribed.  Patient verbalized understanding and agreement of plan as discussed.  All questions were addressed during visit.  Please see discharge instructions below for further details of plan.  Final Clinical Impressions(s) / UC Diagnoses   Final diagnoses:  Upper respiratory tract infection, unspecified type  Moderate persistent asthma with acute exacerbation  Environmental and seasonal allergies  Weakness  Decreased breath sounds in left mid-lung field     Discharge Instructions      You received an injection of Solu-Medrol today during your visit.  I would like for you to pick up your prescription for the Medrol Dosepak on your way home and begin taking the first room tomorrow, then take 1 row daily thereafter.  Please be sure to follow-up with your allergist to let them know that you have been having complications since her last visit.  Please follow-up with either urgent care or the emergency room if you begin to experience worsening shortness of breath that does not respond to epinephrine injection.  Please continue your current allergy and asthma regimen as prescribed.     ED Prescriptions     Medication Sig Dispense Auth. Provider   methylPREDNISolone (MEDROL DOSEPAK) 4 MG TBPK tablet Take 24 mg on day 1, 20 mg on day 2, 16 mg on day 3, 12 mg on day 4, 8 mg on day 5, 4 mg on day 6. 21 tablet Lynden Oxford Scales, PA-C      PDMP not reviewed this encounter.    Lynden Oxford Scales, PA-C 10/02/21 1423

## 2021-10-02 NOTE — ED Triage Notes (Signed)
Pt reports starting 3 days ago she feel fatigued and had a cough.

## 2021-10-02 NOTE — Discharge Instructions (Signed)
You received an injection of Solu-Medrol today during your visit.  I would like for you to pick up your prescription for the Medrol Dosepak on your way home and begin taking the first room tomorrow, then take 1 row daily thereafter.  Please be sure to follow-up with your allergist to let them know that you have been having complications since her last visit.  Please follow-up with either urgent care or the emergency room if you begin to experience worsening shortness of breath that does not respond to epinephrine injection.  Please continue your current allergy and asthma regimen as prescribed.

## 2021-10-03 LAB — SARS CORONAVIRUS 2 (TAT 6-24 HRS): SARS Coronavirus 2: NEGATIVE

## 2021-10-19 ENCOUNTER — Ambulatory Visit: Payer: Medicare HMO | Admitting: Podiatry

## 2021-11-02 NOTE — Progress Notes (Signed)
Office Visit Note  Patient: Pamela Roberson             Date of Birth: 06/12/1956           MRN: [id number]             PCP: Glendon Axe, MD Referring: Glendon Axe, MD Visit Date: 11/03/2021 Occupation: Retired, Lawyer  Subjective:  New Patient (Initial Visit) (Muscle cramps, bil hands cold and locking, total body pain)   History of Present Illness: Pamela Roberson is a 65 y.o. female here for myalgias and weakness.  She has a history of right shoulder pain previous rotator cuff arthropathy and bicipital tendinitis also history of dysphagia.  Her current problems are ongoing for a while but seem to be more persistent especially during past several months.  She is experiencing hands becoming cold and locking up with difficulty using her strength and pain.  Is having body aches and muscle cramping occurring in multiple sites especially including upper leg areas.  She has not noticed any specific provoking position or activity is most commonly occurring in the afternoon or evenings but not necessarily when trying to sleep.  She has partial benefit to muscle relaxants or other home muscle cramp remedies but nothing has prevented frequent recurrence of the symptoms.  He has never had involved walking he has not sustained any falls and has no lack of leg strength between episodes. Also having generalized body pains especially after heavier physical activity. She has noticed dizziness or lightheadedness that comes and goes. Peripheral numbness intermittently. She has occasional mild urinary incontinence but normal most of the time.   Activities of Daily Living:  Patient reports morning stiffness for 24 hours.   Patient Reports nocturnal pain.  Difficulty dressing/grooming: Reports Difficulty climbing stairs: Reports Difficulty getting out of chair: Reports Difficulty using hands for taps, buttons, cutlery, and/or writing: Reports  Review of Systems  Constitutional:   Positive for fatigue.  HENT:  Positive for mouth dryness.   Eyes:  Positive for dryness.  Respiratory:  Positive for shortness of breath.   Cardiovascular:  Negative for swelling in legs/feet.  Gastrointestinal:  Positive for constipation.  Endocrine: Positive for cold intolerance, heat intolerance, excessive thirst and increased urination.  Genitourinary:  Positive for involuntary urination.  Musculoskeletal:  Positive for joint pain, gait problem, joint pain, muscle weakness, morning stiffness and muscle tenderness.  Skin:  Negative for rash.  Allergic/Immunologic: Positive for susceptible to infections.  Neurological:  Positive for dizziness, light-headedness, numbness and weakness.  Hematological:  Negative for bruising/bleeding tendency.  Psychiatric/Behavioral:  Positive for sleep disturbance.    PMFS History:  Patient Active Problem List   Diagnosis Date Noted   Internal hemorrhoids 11/03/2021   Leg cramps 11/03/2021   Myalgia 11/03/2021   Adenomatous polyp of colon 12/04/2020   Anal fissure 12/04/2020   Chronic idiopathic constipation 12/04/2020   Constipation 12/04/2020   Dysphagia 12/04/2020   Gastroesophageal reflux disease 12/04/2020   Rectal pain 12/04/2020   Right upper quadrant pain 12/04/2020   Dizzy 12/11/2017   Pharyngeal dysphagia 12/11/2017   Post-nasal drip 12/11/2017   Referred ear pain, bilateral 12/11/2017   Diabetes mellitus without complication ([place]) 93/57/0177   Family history of glaucoma 06/29/2017   Hyperopia of both eyes with astigmatism and presbyopia 06/29/2017   Keratoconjunctivitis sicca of both eyes not specified as Sjogren's 06/29/2017   Nuclear sclerotic cataract of both eyes 06/29/2017   Vitreous floater, bilateral 06/29/2017   Biceps tendinitis  of right shoulder 04/05/2017   Impingement syndrome of right shoulder 04/05/2017   Arthritis of right acromioclavicular joint 04/05/2017   Partial nontraumatic tear of rotator cuff, right  04/05/2017    Past Medical History:  Diagnosis Date   Arthritis    Asthma    Carotid stenosis, bilateral    Carpal tunnel syndrome    Chronic low back pain    COPD (chronic obstructive pulmonary disease) (HCC)    Diabetes mellitus without complication (HCC)    type II    Diabetic neuropathy (HCC)    Diverticulitis    Dyspnea    mild    Frequent falls    GERD (gastroesophageal reflux disease)    History of bronchitis    Hypercholesterolemia    Hypertension    IBS (irritable bowel syndrome)    Osteoarthritis    knee   Palpitations    Right ventricular enlargement    per pt report   Sleep apnea    mild but no sleep apnea    TMJ (dislocation of temporomandibular joint)    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    Vitamin D deficiency     Family History  Problem Relation Age of Onset   Allergic rhinitis Mother    Diabetes Father        type 2   Heart disease Father    Hypertension Father    Allergic rhinitis Father    Allergic rhinitis Sister    Collagen disease Sister    Heart disease Brother    Hypertension Brother    Angioedema Neg Hx    Asthma Neg Hx    Atopy Neg Hx    Eczema Neg Hx    Immunodeficiency Neg Hx    Urticaria Neg Hx    Past Surgical History:  Procedure Laterality Date   ABDOMINAL HYSTERECTOMY  1993   CARPAL TUNNEL RELEASE Right    CERVICAL [place] SURGERY  04/29/2015   Dr Talor Sis, [place]   ESOPHAGEAL MANOMETRY N/A 03/03/2021   Procedure: ESOPHAGEAL MANOMETRY (EM);  Surgeon: Wilford Corner, MD;  Location: [facility];  Service: Endoscopy;  Laterality: N/A;   ESOPHAGOGASTRODUODENOSCOPY ENDOSCOPY     with esophagus being stretched twice per pt,    EYE SURGERY     fatty 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      feeding tube placement and removal   2015   NASAL SEPTUM SURGERY     ROTATOR CUFF REPAIR Right    spurs removed from esophagus   2015   TONSILLECTOMY     Social History   Social History Narrative   Lives with daughter    Caffeine- coffee 12 oz   Immunization History  Administered Date(s) Administered   Influenza,inj,Quad PF,6+ Mos 09/20/2019   Moderna Sars-Covid-2 Vaccination 12/17/2020, 01/28/2021   Pneumococcal Conjugate-13 09/20/2019   Tdap 05/30/2019     Objective: Vital Signs: BP 116/74 (BP Location: Right Arm, Patient Position: Sitting, Cuff Size: Normal)   Pulse 76   Resp 16   Ht 5\' 6"  (1.676 m)   Wt 208 lb (94.3 kg)   BMI 33.57 kg/m    Physical Exam HENT:     Right Ear: External ear normal.     Left Ear: External ear normal.     Mouth/Throat:     Mouth: Mucous membranes are moist.     Pharynx: Oropharynx is clear.  Cardiovascular:     Rate and Rhythm: Normal rate and regular rhythm.  Pulmonary:  Effort: Pulmonary effort is normal.     Breath sounds: Normal breath sounds.  Musculoskeletal:     Right lower leg: No edema.     Left lower leg: No edema.  Skin:    General: Skin is warm and dry.     Findings: No rash.  Neurological:     General: No focal deficit present.     Mental Status: She is alert.     Deep Tendon Reflexes: Reflexes normal.     Comments: Strength 5/5 throughout  Psychiatric:        Mood and Affect: Mood normal.     Musculoskeletal Exam:  Neck full ROM no tenderness Shoulders full ROM no tenderness or swelling, some pain with overhead abduction Elbows full ROM no tenderness or swelling Wrists full ROM no tenderness or swelling Fingers full ROM no tenderness or swelling Paraspinal muscle tenderness to pressure Knees full ROM no tenderness or swelling Ankles full ROM no tenderness or swelling No focal areas of swelling or tenderness over proximal muscle groups.     Investigation: No additional findings.  Imaging: No results found.  Recent Labs: Lab Results  Component Value Date   WBC 5.2 01/22/2021   HGB 14.2 01/22/2021   PLT 226 01/22/2021   NA 139 01/22/2021   K 3.9 01/22/2021   CL 106 01/22/2021   CO2 23 01/22/2021   GLUCOSE 123 (H)  01/22/2021   BUN 10 01/22/2021   CREATININE 0.89 01/22/2021   BILITOT 0.7 09/17/2020   ALKPHOS 50 09/17/2020   AST 40 09/17/2020   ALT 24 09/17/2020   PROT 6.0 (L) 09/17/2020   ALBUMIN 3.4 (L) 09/17/2020   CALCIUM 9.3 01/22/2021   GFRAA >60 07/08/2020    Speciality Comments: No specialty comments available.  Procedures:  No procedures performed Allergies: Crab [shellfish allergy], Erythromycin, Hm lidocaine patch [lidocaine], Erdafitinib, Fish allergy, Metoclopramide, Sulfacetamide, Sulfacetamide sodium-sulfur, Doxycycline, Levaquin [levofloxacin in d5w], Morphine, Morphine and related, and Sulfa antibiotics   Assessment / Plan:     Visit Diagnoses: Myalgia Leg cramps - Plan: CK, Aldolase  Myalgias and muscle cramps cause is not very clear but is not typical for myositis or other inflammatory myopathy.  We will check CK and aldolase level today for any lab evidence of inflammation or damage. If completely normal might benefit with neurology follow up even though lumbar spine disease seen on MRI does not appear likely to be causing these symptoms.  Impingement syndrome of right shoulder  Right shoulder pain and abducted position consistent with impingement syndrome known rotator cuff arthropathy with partial thickness tear of the shoulder seem consistent.  Recommend conservative treatment with range of motion exercises at this time.  Orders: Orders Placed This Encounter  Procedures   CK   Aldolase    No orders of the defined types were placed in this encounter.    Follow-Up Instructions: No follow-ups on file.   Collier Salina, MD  Note - This record has been created using [employer].  Chart creation errors have been sought, but may not always  have been located. Such creation errors do not reflect on  the standard of medical care.

## 2021-11-03 ENCOUNTER — Other Ambulatory Visit: Payer: Self-pay

## 2021-11-03 ENCOUNTER — Ambulatory Visit (INDEPENDENT_AMBULATORY_CARE_PROVIDER_SITE_OTHER): Payer: Medicare HMO | Admitting: Internal Medicine

## 2021-11-03 ENCOUNTER — Encounter: Payer: Self-pay | Admitting: Internal Medicine

## 2021-11-03 VITALS — BP 116/74 | HR 76 | Resp 16 | Ht 66.0 in | Wt 208.0 lb

## 2021-11-03 DIAGNOSIS — R252 Cramp and spasm: Secondary | ICD-10-CM

## 2021-11-03 DIAGNOSIS — M7541 Impingement syndrome of right shoulder: Secondary | ICD-10-CM | POA: Diagnosis not present

## 2021-11-03 DIAGNOSIS — K648 Other hemorrhoids: Secondary | ICD-10-CM | POA: Insufficient documentation

## 2021-11-03 DIAGNOSIS — M791 Myalgia, unspecified site: Secondary | ICD-10-CM | POA: Diagnosis not present

## 2021-11-04 LAB — CK: Total CK: 60 U/L (ref 29–143)

## 2021-11-04 LAB — ALDOLASE: Aldolase: 4.1 U/L (ref <=8.1)

## 2021-11-15 ENCOUNTER — Telehealth: Payer: Self-pay | Admitting: Internal Medicine

## 2021-11-15 NOTE — Progress Notes (Signed)
Lab results are normal no evidence of muscle damage or inflammation. I think she might benefit with neurology follow up if muscle cramping or spasming is ongoing without improvement. Shoulder pain is consistent with rotator cuff problem we could do injection or referral to PT if needed. Otherwise no routine f/u needed.

## 2021-11-15 NOTE — Telephone Encounter (Signed)
Patient left a voicemail stating she was returning a call to the office.   °

## 2021-11-16 ENCOUNTER — Ambulatory Visit (INDEPENDENT_AMBULATORY_CARE_PROVIDER_SITE_OTHER): Payer: Medicare HMO | Admitting: Internal Medicine

## 2021-11-16 ENCOUNTER — Other Ambulatory Visit: Payer: Self-pay

## 2021-11-16 DIAGNOSIS — G8929 Other chronic pain: Secondary | ICD-10-CM | POA: Diagnosis not present

## 2021-11-16 DIAGNOSIS — M25512 Pain in left shoulder: Secondary | ICD-10-CM | POA: Insufficient documentation

## 2021-11-16 MED ORDER — LIDOCAINE HCL 1 % IJ SOLN
3.0000 mL | INTRAMUSCULAR | Status: AC | PRN
  Administered 2021-11-16: 3 mL

## 2021-11-16 MED ORDER — TRIAMCINOLONE ACETONIDE 40 MG/ML IJ SUSP
40.0000 mg | INTRAMUSCULAR | Status: AC | PRN
  Administered 2021-11-16: 40 mg via INTRA_ARTICULAR

## 2021-11-16 NOTE — Progress Notes (Signed)
° °  Procedure Note  Patient: Pamela Roberson             Date of Birth: [birth year]           MRN: [id number]             Visit Date: 11/16/2021  Procedures: Visit Diagnoses:  1. Chronic left shoulder pain     Large Joint Inj: L subacromial bursa on 11/16/2021 11:30 AM Indications: pain Details: 27 G 1.5 in needle, lateral approach Medications: 3 mL lidocaine 1 %; 40 mg triamcinolone acetonide 40 MG/ML Outcome: tolerated well, no immediate complications Procedure, treatment alternatives, risks and benefits explained, specific risks discussed. Consent was given by the patient. Immediately prior to procedure a time out was called to verify the correct patient, procedure, equipment, support staff and site/side marked as required. Patient was prepped and draped in the usual sterile fashion.     Left shoulder steroid injection today for chronic pain appearing consistent with rotator cuff arthropathy with impingement. Tolerated well and provided ROM exercises can f/u 72mos PRN.

## 2021-12-01 ENCOUNTER — Telehealth: Payer: Self-pay

## 2021-12-01 NOTE — Telephone Encounter (Signed)
NOTES SCANNED TO REFERRAL 

## 2021-12-04 ENCOUNTER — Encounter (HOSPITAL_COMMUNITY): Payer: Self-pay | Admitting: *Deleted

## 2021-12-04 ENCOUNTER — Other Ambulatory Visit: Payer: Self-pay

## 2021-12-04 ENCOUNTER — Ambulatory Visit (HOSPITAL_COMMUNITY)
Admission: EM | Admit: 2021-12-04 | Discharge: 2021-12-04 | Disposition: A | Discharge status: Home / Self Care | Payer: Medicare HMO | Attending: Student | Admitting: Student

## 2021-12-04 DIAGNOSIS — J0100 Acute maxillary sinusitis, unspecified: Secondary | ICD-10-CM

## 2021-12-04 DIAGNOSIS — J4541 Moderate persistent asthma with (acute) exacerbation: Secondary | ICD-10-CM

## 2021-12-04 MED ORDER — AMOXICILLIN-POT CLAVULANATE 875-125 MG PO TABS: 1.0000 | ORAL_TABLET | Freq: Two times a day (BID) | ORAL | 0 refills | Status: DC

## 2021-12-04 MED ORDER — PROMETHAZINE-DM 6.25-15 MG/5ML PO SYRP: 2.5000 mL | ORAL_SOLUTION | Freq: Four times a day (QID) | ORAL | 0 refills | Status: DC | PRN

## 2021-12-04 NOTE — Discharge Instructions (Addendum)
-  Start the antibiotic-Augmentin (amoxicillin-clavulanate), 1 pill every 12 hours for 7 days.  You can take this with food like with breakfast and dinner. -Promethazine DM cough syrup for congestion/cough. This could make you drowsy, so take at night before bed. -Continue albuterol and advair inhalers as directed  -You can apply vaseline inside the nostrils for dry nose

## 2021-12-04 NOTE — ED Provider Notes (Signed)
[place]    CSN: [id number] Arrival date & time: 12/04/21  1737      History   Chief Complaint Chief Complaint  Patient presents with   Cough   Headache   Nasal Congestion    HPI Pamela Roberson is a 65 y.o. female presenting with headache, congestion, cough. Medical history COPD, diabetes, asthma. Takes albuterol inhaler/nebulizer, advair inhaler. Headaches - intermittent throbbing generalized. Increasingly purulent nasal congestion. Cough is nonproductive. Albuterol providing some relief, using this twice a day during present illness. Also took tylenol once which did provide some relief of the HA. Denies fevers/chills, SOB, dizziness, weakness, CP.   HPI  Past Medical History:  Diagnosis Date   Arthritis    Asthma    Carotid stenosis, bilateral    Carpal tunnel syndrome    Chronic low back pain    COPD (chronic obstructive pulmonary disease) (HCC)    Diabetes mellitus without complication ([place])    type II    Diabetic neuropathy (HCC)    Diverticulitis    Dyspnea    mild    Frequent falls    GERD (gastroesophageal reflux disease)    History of bronchitis    Hypercholesterolemia    Hypertension    IBS (irritable bowel syndrome)    Osteoarthritis    knee   Palpitations    Right ventricular enlargement    per pt report   Sleep apnea    mild but no sleep apnea    TMJ (dislocation of temporomandibular joint)    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    Vitamin D deficiency     Patient Active Problem List   Diagnosis Date Noted   Pain in left shoulder 11/16/2021   Internal hemorrhoids 11/03/2021   Leg cramps 11/03/2021   Myalgia 11/03/2021   Adenomatous polyp of colon 12/04/2020   Anal fissure 12/04/2020   Chronic idiopathic constipation 12/04/2020   Constipation 12/04/2020   Dysphagia 12/04/2020   Gastroesophageal reflux disease 12/04/2020   Rectal pain 12/04/2020   Right upper quadrant pain 12/04/2020   Dizzy 12/11/2017    Pharyngeal dysphagia 12/11/2017   Post-nasal drip 12/11/2017   Referred ear pain, bilateral 12/11/2017   Diabetes mellitus without complication ([place]) 10/07/1593   Family history of glaucoma 06/29/2017   Hyperopia of both eyes with astigmatism and presbyopia 06/29/2017   Keratoconjunctivitis sicca of both eyes not specified as Sjogren's 06/29/2017   Nuclear sclerotic cataract of both eyes 06/29/2017   Vitreous floater, bilateral 06/29/2017   Biceps tendinitis of right shoulder 04/05/2017   Impingement syndrome of right shoulder 04/05/2017   Arthritis of right acromioclavicular joint 04/05/2017   Partial nontraumatic tear of rotator cuff, right 04/05/2017    Past Surgical History:  Procedure Laterality Date   ABDOMINAL HYSTERECTOMY  1993   CARPAL TUNNEL RELEASE Right    CERVICAL [place] SURGERY  04/29/2015   Dr Vaunda Sis, [place] N/A 03/03/2021   Procedure: ESOPHAGEAL MANOMETRY (EM);  Surgeon: Wilford Corner, MD;  Location: [facility];  Service: Endoscopy;  Laterality: N/A;   ESOPHAGOGASTRODUODENOSCOPY ENDOSCOPY     with esophagus being stretched twice per pt,    EYE SURGERY     fatty 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      feeding tube placement and removal   2015   NASAL SEPTUM SURGERY     ROTATOR CUFF REPAIR Right    spurs removed from esophagus   2015   TONSILLECTOMY  OB History   No obstetric history on file.      Home Medications    Prior to Admission medications   Medication Sig Start Date End Date Taking? Authorizing Provider  amoxicillin-clavulanate (AUGMENTIN) 875-125 MG tablet Take 1 tablet by mouth every 12 (twelve) hours. 12/04/21  Yes Hazel Sams, PA-C  promethazine-dextromethorphan (PROMETHAZINE-DM) 6.25-15 MG/5ML syrup Take 2.5 mLs by mouth 4 (four) times daily as needed for cough. 12/04/21  Yes Hazel Sams, PA-C  Acetaminophen-Codeine 300-30 MG tablet  12/19/20   [provider]  acyclovir (ZOVIRAX) 400 MG tablet  Take 400 mg by mouth 2 (two) times daily.    [provider]  albuterol (PROAIR HFA) 108 (90 Base) MCG/ACT inhaler Inhale 1-2 puffs into the lungs every 6 (six) hours as needed for wheezing or shortness of breath. 06/14/20   Zigmund Gottron, NP  albuterol (PROVENTIL) (2.5 MG/3ML) 0.083% nebulizer solution Take 3 mLs (2.5 mg total) by nebulization every 4 (four) hours as needed for wheezing or shortness of breath. 03/11/19   Raylene Everts, MD  Alcohol Swabs (B-D SINGLE USE SWABS REGULAR) PADS Apply topically. 05/16/21   [provider]  ALPRAZolam Duanne Moron) 0.5 MG tablet Take one tab 30-45 minutes prior to MRI, may repeat x 1 if needed before MRI. Patient not taking: Reported on 11/03/2021 05/24/21   Penumalli, Earlean Polka, MD  Artificial Tear Ointment (DRY EYES OP) Place 1-2 drops into both eyes daily as needed (for dry eyes).     [provider]  Ascorbic Acid (VITAMIN C) 1000 MG tablet Take 1,000 mg by mouth daily.    [provider]  atorvastatin (LIPITOR) 10 MG tablet Take 10 mg by mouth daily.    [provider]  Blood Glucose Calibration (TRUE METRIX LEVEL 1) Low SOLN  09/20/21   [provider]  Blood Glucose Monitoring Suppl (TRUE METRIX METER) w/Device KIT  09/20/21   [provider]  cholecalciferol (VITAMIN D3) 25 MCG (1000 UNIT) tablet Take 2,000 Units by mouth daily.  Patient not taking: Reported on 11/03/2021    [provider]  cyclobenzaprine (FLEXERIL) 10 MG tablet Take 1 tablet (10 mg total) by mouth 2 (two) times daily as needed for muscle spasms. Patient not taking: Reported on 11/03/2021 04/19/21   Domenic Moras, PA-C  cycloSPORINE (RESTASIS) 0.05 % ophthalmic emulsion Place 1 drop into both eyes 2 times daily. 02/02/21   [provider]  cycloSPORINE (RESTASIS) 0.05 % ophthalmic emulsion 1 drop into affected eye    [provider]  dicyclomine (BENTYL) 20 MG tablet Take 20 mg by mouth 3 (three)  times daily. 10/17/19   [provider]  EPINEPHrine 0.1 MG/0.1ML SOAJ Inject as directed See admin instructions.    [provider]  fexofenadine (ALLEGRA) 180 MG tablet  12/24/20   [provider]  fluticasone (FLONASE) 50 MCG/ACT nasal spray Place 1 spray into both nostrils daily as needed for allergies or rhinitis.    [provider]  fluticasone-salmeterol (ADVAIR HFA) 115-21 MCG/ACT inhaler Inhale 2 puffs into the lungs 2 (two) times daily. 09/23/21   Kennith Gain, MD  gabapentin (NEURONTIN) 300 MG capsule Take 300 mg by mouth 3 (three) times daily.    [provider]  ipratropium (ATROVENT) 0.06 % nasal spray Place 2 sprays into both nostrils in the morning and at bedtime. 01/07/21   Kennith Gain, MD  JANUMET XR 50-500 MG TB24 Take 1 tablet by mouth daily. 09/27/20  [provider]  ketorolac (ACULAR) 0.5 % ophthalmic solution One drop in operative eye(s) TID starting 2 days before surgery and continuing 3 weeks after surgery. Patient not taking: Reported on 11/03/2021 02/02/21   [provider]  meclizine (ANTIVERT) 25 MG tablet Take by mouth. 08/26/21   [provider]  methylPREDNISolone (MEDROL DOSEPAK) 4 MG TBPK tablet Take 24 mg on day 1, 20 mg on day 2, 16 mg on day 3, 12 mg on day 4, 8 mg on day 5, 4 mg on day 6. Patient not taking: Reported on 11/03/2021 10/02/21   Lynden Oxford Scales, PA-C  metoprolol succinate (TOPROL-XL) 25 MG 24 hr tablet Take by mouth. Patient not taking: Reported on 11/03/2021    [provider]  metoprolol tartrate (LOPRESSOR) 25 MG tablet Take 25 mg by mouth 2 (two) times daily.    [provider]  montelukast (SINGULAIR) 10 MG tablet Take 10 mg by mouth daily.     [provider]  Multiple Vitamin (MULTIVITAMIN WITH MINERALS) TABS tablet Take 1 tablet by mouth daily.    [provider]  nystatin cream (MYCOSTATIN) Apply 1  application topically 2 (two) times daily. 03/18/21   [provider]  ondansetron (ZOFRAN) 4 MG tablet Take 4 mg by mouth every 8 (eight) hours as needed. Patient not taking: Reported on 11/03/2021 02/15/21   [provider]  ondansetron (ZOFRAN-ODT) 8 MG disintegrating tablet Take 1 tablet (8 mg total) by mouth every 8 (eight) hours as needed for nausea or vomiting. 08/28/20   Jaynee Eagles, PA-C  pantoprazole (PROTONIX) 40 MG tablet Take 40 mg by mouth 2 (two) times daily.  11/20/19   [provider]  sucralfate (CARAFATE) 1 g tablet Take 1 g by mouth 4 (four) times daily.  02/11/20   [provider]  TRUE METRIX BLOOD GLUCOSE TEST test strip 1 each 3 (three) times daily. 09/20/21   [provider]  TRUEplus Lancets 33G MISC Apply 1 each topically 3 (three) times daily. 09/20/21   [provider]  esomeprazole (NEXIUM) 40 MG capsule Take 1 capsule (40 mg total) by mouth daily. Patient not taking: Reported on 07/08/2020 04/24/19 08/06/20  Charlesetta Shanks, MD    Family History Family History  Problem Relation Age of Onset   Allergic rhinitis Mother    Diabetes Father        type 2   Heart disease Father    Hypertension Father    Allergic rhinitis Father    Allergic rhinitis Sister    Collagen disease Sister    Heart disease Brother    Hypertension Brother    Angioedema Neg Hx    Asthma Neg Hx    Atopy Neg Hx    Eczema Neg Hx    Immunodeficiency Neg Hx    Urticaria Neg Hx     Social History Social History   Tobacco Use   Smoking status: Former    Packs/day: 0.50    Years: 30.00    Pack years: 15.00    Types: Cigarettes    Quit date: 12/08/1994    Years since quitting: 27.0   Smokeless tobacco: Never  Vaping Use   Vaping Use: Never used  Substance Use Topics   Alcohol use: No   Drug use: No     Allergies   Crab [shellfish allergy], Erythromycin, Hm lidocaine patch [lidocaine], Erdafitinib, Fish allergy, Metoclopramide,  Sulfacetamide, Sulfacetamide sodium-sulfur, Doxycycline, Levaquin [levofloxacin in d5w], Morphine, Morphine and related, and Sulfa  antibiotics   Review of Systems Review of Systems  Constitutional:  Negative for appetite change, chills and fever.  HENT:  Positive for congestion. Negative for ear pain, rhinorrhea, sinus pressure, sinus pain and sore throat.   Eyes:  Negative for redness and visual disturbance.  Respiratory:  Positive for cough. Negative for chest tightness, shortness of breath and wheezing.   Cardiovascular:  Negative for chest pain and palpitations.  Gastrointestinal:  Negative for abdominal pain, constipation, diarrhea, nausea and vomiting.  Genitourinary:  Negative for dysuria, frequency and urgency.  Musculoskeletal:  Negative for myalgias.  Neurological:  Negative for dizziness, weakness and headaches.  Psychiatric/Behavioral:  Negative for confusion.   All other systems reviewed and are negative.   Physical Exam Triage Vital Signs ED Triage Vitals  Enc Vitals Group     BP      Pulse      Resp      Temp      Temp src      SpO2      Weight      Height      Head Circumference      Peak Flow      Pain Score      Pain Loc      Pain Edu?      Excl. in [place]?    No data found.  Updated Vital Signs BP 140/69    Pulse 86    Temp 98 F (36.7 C)    Resp 18    SpO2 100%   Visual Acuity Right Eye Distance:   Left Eye Distance:   Bilateral Distance:    Right Eye Near:   Left Eye Near:    Bilateral Near:     Physical Exam Vitals reviewed.  Constitutional:      General: She is not in acute distress.    Appearance: Normal appearance. She is not ill-appearing.  HENT:     Head: Normocephalic and atraumatic.     Right Ear: Tympanic membrane, ear canal and external ear normal. No tenderness. No middle ear effusion. There is no impacted cerumen. Tympanic membrane is not perforated, erythematous, retracted or bulging.     Left Ear: Tympanic membrane, ear canal  and external ear normal. No tenderness.  No middle ear effusion. There is no impacted cerumen. Tympanic membrane is not perforated, erythematous, retracted or bulging.     Nose: Congestion present.     Right Sinus: Maxillary sinus tenderness present.     Left Sinus: Maxillary sinus tenderness present.     Comments: No sores or lesions inside nose. No epistaxis.     Mouth/Throat:     Mouth: Mucous membranes are moist.     Pharynx: Uvula midline. No oropharyngeal exudate or posterior oropharyngeal erythema.  Eyes:     Extraocular Movements: Extraocular movements intact.     Pupils: Pupils are equal, round, and reactive to light.  Cardiovascular:     Rate and Rhythm: Normal rate and regular rhythm.     Heart sounds: Normal heart sounds.  Pulmonary:     Effort: Pulmonary effort is normal.     Breath sounds: Normal breath sounds. No decreased breath sounds, wheezing, rhonchi or rales.  Abdominal:     Palpations: Abdomen is soft.     Tenderness: There is no abdominal tenderness. There is no guarding or rebound.  Lymphadenopathy:     Cervical: No cervical adenopathy.     Right cervical: No superficial cervical adenopathy.    Left  cervical: No superficial cervical adenopathy.  Neurological:     General: No focal deficit present.     Mental Status: She is alert and oriented to person, place, and time.  Psychiatric:        Mood and Affect: Mood normal.        Behavior: Behavior normal.        Thought Content: Thought content normal.        Judgment: Judgment normal.     UC Treatments / Results  Labs (all labs ordered are listed, but only abnormal results are displayed) Labs Reviewed - No data to display  EKG   Radiology No results found.  Procedures Procedures (including critical care time)  Medications Ordered in UC Medications - No data to display  Initial Impression / Assessment and Plan / UC Course  I have reviewed the triage vital signs and the nursing  notes.  Pertinent labs & imaging results that were available during my care of the patient were reviewed by me and considered in my medical decision making (see chart for details).     This patient is a very pleasant 65 y.o. year old female presenting with sinusitis following viral URI x3 weeks. Afebrile, nontachy.   Continue albuterol and advair inhalers, she declines refills.   Given 3 weeks of progressively worsening purulent nasal congestion and sinus pressure, will manage as bacterial sinusitis. Will manage with Augmentin as below. Also sent low-dose promethazine at patient request.  ED return precautions discussed. Patient verbalizes understanding and agreement.   Coding Level 4 for acute exacerbation of chronic condition, and prescription drug management  Final Clinical Impressions(s) / UC Diagnoses   Final diagnoses:  Acute non-recurrent maxillary sinusitis  Moderate persistent asthma with acute exacerbation     Discharge Instructions      -Start the antibiotic-Augmentin (amoxicillin-clavulanate), 1 pill every 12 hours for 7 days.  You can take this with food like with breakfast and dinner. -Promethazine DM cough syrup for congestion/cough. This could make you drowsy, so take at night before bed. -Continue albuterol and advair inhalers as directed  -You can apply vaseline inside the nostrils for dry nose      ED Prescriptions     Medication Sig Dispense Auth. Provider   amoxicillin-clavulanate (AUGMENTIN) 875-125 MG tablet Take 1 tablet by mouth every 12 (twelve) hours. 14 tablet Hazel Sams, PA-C   promethazine-dextromethorphan (PROMETHAZINE-DM) 6.25-15 MG/5ML syrup Take 2.5 mLs by mouth 4 (four) times daily as needed for cough. 50 mL Hazel Sams, PA-C      PDMP not reviewed this encounter.   Hazel Sams, PA-C 12/04/21 1800

## 2021-12-04 NOTE — ED Triage Notes (Signed)
Pt has had a cough and congestion for 3 weeks. Pt also has sores in her nose since last weak . Pt also reports a HA sins yesterday.

## 2021-12-28 ENCOUNTER — Ambulatory Visit: Payer: Medicare HMO | Admitting: Cardiovascular Disease

## 2022-01-01 NOTE — Progress Notes (Deleted)
Cardiology Office Note:    Date:  01/01/2022   ID:  Pamela Roberson, DOB 1956/03/26, MRN [id number]  PCP:  Glendon Axe, MD   [facility] Providers Cardiologist:  None {    Referring MD: Glendon Axe, MD     History of Present Illness:    Pamela Roberson is a 66 y.o. female with a hx of COPD, DMII, HTN, HLD, OSA and asthma who was referred by Dr. Tamala Julian for further evaluation of dyspnea on exertion.  Today, ***   Past Medical History:  Diagnosis Date   Arthritis    Asthma    Carotid stenosis, bilateral    Carpal tunnel syndrome    Chronic low back pain    COPD (chronic obstructive pulmonary disease) (HCC)    Diabetes mellitus without complication (HCC)    type II    Diabetic neuropathy (HCC)    Diverticulitis    Dyspnea    mild    Frequent falls    GERD (gastroesophageal reflux disease)    History of bronchitis    Hypercholesterolemia    Hypertension    IBS (irritable bowel syndrome)    Osteoarthritis    knee   Palpitations    Right ventricular enlargement    per pt report   Sleep apnea    mild but no sleep apnea    TMJ (dislocation of temporomandibular joint)    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    Vitamin D deficiency     Past Surgical History:  Procedure Laterality Date   ABDOMINAL HYSTERECTOMY  1993   CARPAL TUNNEL RELEASE Right    CERVICAL [place] SURGERY  04/29/2015   Dr Kaytee Sis, [place] N/A 03/03/2021   Procedure: ESOPHAGEAL MANOMETRY (EM);  Surgeon: Wilford Corner, MD;  Location: [facility];  Service: Endoscopy;  Laterality: N/A;   ESOPHAGOGASTRODUODENOSCOPY ENDOSCOPY     with esophagus being stretched twice per pt,    EYE SURGERY     fatty 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      feeding tube placement and removal   2015   NASAL SEPTUM SURGERY     ROTATOR CUFF REPAIR Right    spurs removed from esophagus   2015   TONSILLECTOMY      Current Medications: No outpatient medications have been  marked as taking for the 01/04/22 encounter (Appointment) with Freada Bergeron, MD.     Allergies:   Otho Darner allergy], Erythromycin, Hm lidocaine patch [lidocaine], Erdafitinib, Fish allergy, Metoclopramide, Sulfacetamide, Sulfacetamide sodium-sulfur, Doxycycline, Levaquin [levofloxacin in d5w], Morphine, Morphine and related, and Sulfa antibiotics   Social History   Socioeconomic History   Marital status: Widowed    Spouse name: Not on file   Number of children: 3   Years of education: Not on file   Highest education level: 11th grade  Occupational History    Comment: NA  Tobacco Use   Smoking status: Former    Packs/day: 0.50    Years: 30.00    Pack years: 15.00    Types: Cigarettes    Quit date: 12/08/1994    Years since quitting: 27.0   Smokeless tobacco: Never  Vaping Use   Vaping Use: Never used  Substance and Sexual Activity   Alcohol use: No   Drug use: No   Sexual activity: Not on file  Other Topics Concern   Not on file  Social History Narrative   Lives with daughter   Caffeine- coffee  12 oz   Social Determinants of Health   Financial Resource Strain: Not on file  Food Insecurity: Not on file  Transportation Needs: Not on file  Physical Activity: Not on file  Stress: Not on file  Social Connections: Not on file     Family History: The patient's ***family history includes Allergic rhinitis in her father, mother, and sister; Collagen disease in her sister; Diabetes in her father; Heart disease in her brother and father; Hypertension in her brother and father. There is no history of Angioedema, Asthma, Atopy, Eczema, Immunodeficiency, or Urticaria.  ROS:   Please see the history of present illness.    *** All other systems reviewed and are negative.  EKGs/Labs/Other Studies Reviewed:    The following studies were reviewed today: ***  EKG:  EKG is *** ordered today.  The ekg ordered today demonstrates ***  Recent Labs: 01/22/2021: BUN 10;  Creatinine, Ser 0.89; Hemoglobin 14.2; Platelets 226; Potassium 3.9; Sodium 139  Recent Lipid Panel No results found for: CHOL, TRIG, HDL, CHOLHDL, VLDL, LDLCALC, LDLDIRECT   Risk Assessment/Calculations:   {Does this patient have ATRIAL FIBRILLATION?:[id number]}       Physical Exam:    VS:  There were no vitals taken for this visit.    Wt Readings from Last 3 Encounters:  11/03/21 208 lb (94.3 kg)  09/23/21 205 lb 12.8 oz (93.4 kg)  05/04/21 190 lb (86.2 kg)     GEN: *** Well nourished, well developed in no acute distress HEENT: Normal NECK: No JVD; No carotid bruits LYMPHATICS: No lymphadenopathy CARDIAC: ***RRR, no murmurs, rubs, gallops RESPIRATORY:  Clear to auscultation without rales, wheezing or rhonchi  ABDOMEN: Soft, non-tender, non-distended MUSCULOSKELETAL:  No edema; No deformity  SKIN: Warm and dry NEUROLOGIC:  Alert and oriented x 3 PSYCHIATRIC:  Normal affect   ASSESSMENT:    No diagnosis found. PLAN:    In order of problems listed above:  #Dyspnea on Exertion: Many risk factors for CAD given DMII, history of tobacco use, HTN and HLD. Will check coronary CTA and TTE. -Check coronart CTA and TTE  #HTN: -Continue metop 25mg  XL daily  #DMII: -Continue janumet 50-55mg   #COPD: #Asthma: -Follow-up PCP      {Are you ordering a CV Procedure (e.g. stress test, cath, DCCV, TEE, etc)?   Press F2        :[id number]}    Medication Adjustments/Labs and Tests Ordered: Current medicines are reviewed at length with the patient today.  Concerns regarding medicines are outlined above.  No orders of the defined types were placed in this encounter.  No orders of the defined types were placed in this encounter.   There are no Patient Instructions on file for this visit.   Signed, Freada Bergeron, MD  01/01/2022 8:57 PM    [place]

## 2022-01-04 ENCOUNTER — Encounter: Payer: Self-pay | Admitting: Cardiology

## 2022-01-04 ENCOUNTER — Ambulatory Visit (INDEPENDENT_AMBULATORY_CARE_PROVIDER_SITE_OTHER): Payer: Medicare HMO | Admitting: Cardiology

## 2022-01-04 ENCOUNTER — Other Ambulatory Visit: Payer: Self-pay

## 2022-01-04 VITALS — BP 140/80 | HR 78 | Ht 66.0 in | Wt 207.8 lb

## 2022-01-04 DIAGNOSIS — R0602 Shortness of breath: Secondary | ICD-10-CM

## 2022-01-04 DIAGNOSIS — R011 Cardiac murmur, unspecified: Secondary | ICD-10-CM

## 2022-01-04 DIAGNOSIS — E119 Type 2 diabetes mellitus without complications: Secondary | ICD-10-CM | POA: Diagnosis not present

## 2022-01-04 DIAGNOSIS — I10 Essential (primary) hypertension: Secondary | ICD-10-CM | POA: Diagnosis not present

## 2022-01-04 DIAGNOSIS — J45909 Unspecified asthma, uncomplicated: Secondary | ICD-10-CM

## 2022-01-04 NOTE — Progress Notes (Signed)
Cardiology Office Note:    Date:  01/04/2022   ID:  Pamela Roberson, DOB [birth year], MRN [id number]  PCP:  Glendon Axe, MD   [facility] Providers Cardiologist:  None {    Referring MD: Glendon Axe, MD    History of Present Illness:    Pamela Roberson is a 66 y.o. female with a hx of COPD, DMII, HTN, HLD, OSA and asthma who was referred by Dr. Tamala Julian for further evaluation of dyspnea on exertion.  Today, she is doing well. She saw Dr. Tamala Julian at [facility] and was told her heart was enlarged. She reports she underwent a stress test and echocardiogram last Summer. However, she is not aware of the results and records not available today.  Last week, she had an episode of shortness of breath which she associated with her asthma. The shortness of breath slightly  improved with her inhaler. She also has some SOB when walking up a flight of stairs. States this is the reason she had the testing as above.  She records her blood pressure at home and reports average pressure of 932 systolic. She is compliant with her medications. She quit smoking in 1996.   The patient denies chest pain, chest pressure, palpitations, PND, orthopnea, or leg swelling. Denies cough, fever, chills. Denies nausea, vomiting. Denies syncope or presyncope. Denies snoring.  Past Medical History:  Diagnosis Date   Arthritis    Asthma    Carotid stenosis, bilateral    Carpal tunnel syndrome    Chronic low back pain    COPD (chronic obstructive pulmonary disease) (HCC)    Diabetes mellitus without complication (HCC)    type II    Diabetic neuropathy (HCC)    Diverticulitis    Dyspnea    mild    Frequent falls    GERD (gastroesophageal reflux disease)    History of bronchitis    Hypercholesterolemia    Hypertension    IBS (irritable bowel syndrome)    Osteoarthritis    knee   Palpitations    Right ventricular enlargement    per pt report   Sleep apnea    mild but no sleep apnea     TMJ (dislocation of temporomandibular joint)    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    Vitamin D deficiency     Past Surgical History:  Procedure Laterality Date   ABDOMINAL HYSTERECTOMY  1993   CARPAL TUNNEL RELEASE Right    CERVICAL DISC SURGERY  04/29/2015   Dr Ashleymarie Sis, [place] N/A 03/03/2021   Procedure: ESOPHAGEAL MANOMETRY (EM);  Surgeon: Wilford Corner, MD;  Location: [facility];  Service: Endoscopy;  Laterality: N/A;   ESOPHAGOGASTRODUODENOSCOPY ENDOSCOPY     with esophagus being stretched twice per pt,    EYE SURGERY     fatty 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      feeding tube placement and removal   2015   NASAL SEPTUM SURGERY     ROTATOR CUFF REPAIR Right    spurs removed from esophagus   2015   TONSILLECTOMY      Current Medications: Current Meds  Medication Sig   acyclovir (ZOVIRAX) 400 MG tablet Take 400 mg by mouth 2 (two) times daily.   albuterol (PROAIR HFA) 108 (90 Base) MCG/ACT inhaler Inhale 1-2 puffs into the lungs every 6 (six) hours as needed for wheezing or shortness of breath.   albuterol (PROVENTIL) (2.5 MG/3ML) 0.083% nebulizer solution  Take 3 mLs (2.5 mg total) by nebulization every 4 (four) hours as needed for wheezing or shortness of breath.   Alcohol Swabs (B-D SINGLE USE SWABS REGULAR) PADS Apply topically.   Artificial Tear Ointment (DRY EYES OP) Place 1-2 drops into both eyes daily as needed (for dry eyes).    Ascorbic Acid (VITAMIN C) 1000 MG tablet Take 1,000 mg by mouth daily.   atorvastatin (LIPITOR) 10 MG tablet Take 10 mg by mouth daily.   Blood Glucose Calibration (TRUE METRIX LEVEL 1) Low SOLN    Blood Glucose Monitoring Suppl (TRUE METRIX METER) w/Device KIT    dicyclomine (BENTYL) 20 MG tablet Take 20 mg by mouth 3 (three) times daily.   EPINEPHrine 0.1 MG/0.1ML SOAJ Inject as directed See admin instructions.   fexofenadine (ALLEGRA) 180 MG tablet    fluticasone (FLONASE) 50 MCG/ACT nasal spray  Place 1 spray into both nostrils daily as needed for allergies or rhinitis.   fluticasone-salmeterol (ADVAIR HFA) 115-21 MCG/ACT inhaler Inhale 2 puffs into the lungs 2 (two) times daily.   gabapentin (NEURONTIN) 300 MG capsule Take 300 mg by mouth 3 (three) times daily.   ipratropium (ATROVENT) 0.06 % nasal spray Place 2 sprays into both nostrils in the morning and at bedtime.   JANUMET XR 50-500 MG TB24 Take 1 tablet by mouth daily.   meclizine (ANTIVERT) 25 MG tablet Take by mouth.   metoprolol succinate (TOPROL-XL) 25 MG 24 hr tablet Take by mouth.   metoprolol tartrate (LOPRESSOR) 25 MG tablet Take 25 mg by mouth 2 (two) times daily.   montelukast (SINGULAIR) 10 MG tablet Take 10 mg by mouth daily.    Multiple Vitamin (MULTIVITAMIN WITH MINERALS) TABS tablet Take 1 tablet by mouth daily.   ondansetron (ZOFRAN) 4 MG tablet Take 4 mg by mouth every 8 (eight) hours as needed.   ondansetron (ZOFRAN-ODT) 8 MG disintegrating tablet Take 1 tablet (8 mg total) by mouth every 8 (eight) hours as needed for nausea or vomiting.   pantoprazole (PROTONIX) 40 MG tablet Take 40 mg by mouth 2 (two) times daily.    promethazine-dextromethorphan (PROMETHAZINE-DM) 6.25-15 MG/5ML syrup Take 2.5 mLs by mouth 4 (four) times daily as needed for cough.   sucralfate (CARAFATE) 1 g tablet Take 1 g by mouth 4 (four) times daily.    TRUE METRIX BLOOD GLUCOSE TEST test strip 1 each 3 (three) times daily.   TRUEplus Lancets 33G MISC Apply 1 each topically 3 (three) times daily.     Allergies:   Crab [shellfish allergy], Erythromycin, Hm lidocaine patch [lidocaine], Erdafitinib, Fish allergy, Metoclopramide, Sulfacetamide, Sulfacetamide sodium-sulfur, Doxycycline, Levaquin [levofloxacin in d5w], Morphine, Morphine and related, and Sulfa antibiotics   Social History   Socioeconomic History   Marital status: Widowed    Spouse name: Not on file   Number of children: 3   Years of education: Not on file   Highest  education level: 11th grade  Occupational History    Comment: NA  Tobacco Use   Smoking status: Former    Packs/day: 0.50    Years: 30.00    Pack years: 15.00    Types: Cigarettes    Quit date: 12/08/1994    Years since quitting: 27.0   Smokeless tobacco: Never  Vaping Use   Vaping Use: Never used  Substance and Sexual Activity   Alcohol use: No   Drug use: No   Sexual activity: Not on file  Other Topics Concern   Not on file  Social  History Narrative   Lives with daughter   Caffeine- coffee 12 oz   Social Determinants of Health   Financial Resource Strain: Not on file  Food Insecurity: Not on file  Transportation Needs: Not on file  Physical Activity: Not on file  Stress: Not on file  Social Connections: Not on file     Family History: The patient's family history includes Allergic rhinitis in her father, mother, and sister; Collagen disease in her sister; Diabetes in her father; Heart disease in her brother and father; Hypertension in her brother and father. There is no history of Angioedema, Asthma, Atopy, Eczema, Immunodeficiency, or Urticaria.  ROS:   Please see the history of present illness.    Review of Systems  Constitutional:  Negative for chills and fever.  HENT:  Negative for congestion and sinus pain.   Eyes:  Negative for blurred vision.  Respiratory:  Positive for shortness of breath.   Cardiovascular:  Negative for chest pain, palpitations, orthopnea, claudication, leg swelling and PND.  Gastrointestinal:  Negative for heartburn and nausea.  Genitourinary:  Negative for urgency.  Musculoskeletal:  Negative for joint pain and myalgias.  Skin:  Negative for itching and rash.  Neurological:  Positive for dizziness. Negative for headaches.  Endo/Heme/Allergies:  Does not bruise/bleed easily.  Psychiatric/Behavioral:  The patient is not nervous/anxious and does not have insomnia.    All other systems reviewed and are negative.  EKGs/Labs/Other Studies  Reviewed:    The following studies were reviewed today: No prior cardiovascular studies  EKG:   01/04/22; Sinus rhythm, rate 78 bpm  Recent Labs: 01/22/2021: BUN 10; Creatinine, Ser 0.89; Hemoglobin 14.2; Platelets 226; Potassium 3.9; Sodium 139  Recent Lipid Panel No results found for: CHOL, TRIG, HDL, CHOLHDL, VLDL, LDLCALC, LDLDIRECT   Risk Assessment/Calculations:           Physical Exam:    VS:  BP 140/80    Pulse 78    Ht 5' 6"  (1.676 m)    Wt 207 lb 12.8 oz (94.3 kg)    SpO2 99%    BMI 33.54 kg/m     Wt Readings from Last 3 Encounters:  01/04/22 207 lb 12.8 oz (94.3 kg)  11/03/21 208 lb (94.3 kg)  09/23/21 205 lb 12.8 oz (93.4 kg)     GEN: Well nourished, well developed in no acute distress HEENT: Normal NECK: No JVD; No carotid bruits CARDIAC: RRR, 2/6 systolic murmur, no rubs or gallops RESPIRATORY:  Clear to auscultation without rales, wheezing or rhonchi  ABDOMEN: Soft, non-tender, non-distended MUSCULOSKELETAL:  No edema; No deformity  SKIN: Warm and dry NEUROLOGIC:  Alert and oriented x 3 PSYCHIATRIC:  Normal affect   ASSESSMENT:    1. SOB (shortness of breath)   2. Primary hypertension   3. Diabetes mellitus with coincident hypertension ([place])   4. Systolic murmur   5. Asthma, unspecified asthma severity, unspecified whether complicated, unspecified whether persistent    PLAN:    In order of problems listed above:  #Dyspnea on Exertion: Many risk factors for CAD given DMII, history of tobacco use, HTN and HLD. Has had recent stress test and echo per report but she is not aware of the results and no records available today. Will try to obtain records to avoid repeat testing and consider coronary CTA pending results. -Obtain records of TTE and myoview from [facility] -Will determine further work-up pending results above  #HTN: Well controlled. -Continue metop 36m XL daily  #DMII: -Continue janumet  50-42m  #COPD: #Asthma: -Follow-up  PCP  #Systolic Murmur: [facility]         Medication Adjustments/Labs and Tests Ordered: Current medicines are reviewed at length with the patient today.  Concerns regarding medicines are outlined above.  Orders Placed This Encounter  Procedures   EKG 12-Lead   No orders of the defined types were placed in this encounter.   Patient Instructions  Medication Instructions:   Your physician recommends that you continue on your current medications as directed. Please refer to the Current Medication list given to you today.  *If you need a refill on your cardiac medications before your next appointment, please call your pharmacy*    Follow-Up: At [facility] you and your health needs are our priority.  As part of our continuing mission to provide you with exceptional heart care, we have created designated Provider Care Teams.  These Care Teams include your primary Cardiologist (physician) and Advanced Practice Providers (APPs -  Physician Assistants and Nurse Practitioners) who all work together to provide you with the care you need, when you need it.  We recommend signing up for the patient portal called "MyChart".  Sign up information is provided on this After Visit Summary.  MyChart is used to connect with patients for Virtual Visits (Telemedicine).  Patients are able to view lab/test results, encounter notes, upcoming appointments, etc.  Non-urgent messages can be sent to your provider as well.   To learn more about what you can do with MyChart, go to [email or web address]    Your next appointment:   6 month(s)  The format for your next appointment:   In Person  Provider:   DR. PReece Leaderas a scribe for HFreada Bergeron MD.,have documented all relevant documentation on the behalf of HFreada Bergeron MD,as directed by  HFreada Bergeron MD while in the presence of HFreada Bergeron MD.  I, HFreada Bergeron MD, have reviewed all documentation for this visit. The documentation on 01/04/22 for the exam, diagnosis, procedures, and orders are all accurate and complete.   Signed, HFreada Bergeron MD  01/04/2022 4:49 PM    [place]

## 2022-01-04 NOTE — Patient Instructions (Signed)
Medication Instructions:   Your physician recommends that you continue on your current medications as directed. Please refer to the Current Medication list given to you today.  *If you need a refill on your cardiac medications before your next appointment, please call your pharmacy*   Follow-Up: At [facility], you and your health needs are our priority.  As part of our continuing mission to provide you with exceptional heart care, we have created designated Provider Care Teams.  These Care Teams include your primary Cardiologist (physician) and Advanced Practice Providers (APPs -  Physician Assistants and Nurse Practitioners) who all work together to provide you with the care you need, when you need it.  We recommend signing up for the patient portal called "MyChart".  Sign up information is provided on this After Visit Summary.  MyChart is used to connect with patients for Virtual Visits (Telemedicine).  Patients are able to view lab/test results, encounter notes, upcoming appointments, etc.  Non-urgent messages can be sent to your provider as well.   To learn more about what you can do with MyChart, go to [email or web address].    Your next appointment:   6 month(s)  The format for your next appointment:   In Person  Provider:    DR. PEMBERTON   

## 2022-01-05 ENCOUNTER — Telehealth: Payer: Self-pay | Admitting: Cardiology

## 2022-01-05 NOTE — Telephone Encounter (Signed)
Called and spoke to the patient as we obtained records from [facility]: TTE showed EF 60-65%, normal RV size and function, no valve disease, normal aorta  Myoview with no evidence of ischemia or infarction.  Concerned her symptoms may be related to asthma. Will refer to Pulm. If persist despite asthma treatment, can do coronary CTA at that time.

## 2022-01-07 ENCOUNTER — Other Ambulatory Visit: Payer: Self-pay

## 2022-01-07 ENCOUNTER — Ambulatory Visit (INDEPENDENT_AMBULATORY_CARE_PROVIDER_SITE_OTHER): Payer: Medicare HMO | Admitting: Podiatry

## 2022-01-07 ENCOUNTER — Encounter: Payer: Self-pay | Admitting: Podiatry

## 2022-01-07 DIAGNOSIS — M2011 Hallux valgus (acquired), right foot: Secondary | ICD-10-CM

## 2022-01-07 DIAGNOSIS — M2141 Flat foot [pes planus] (acquired), right foot: Secondary | ICD-10-CM | POA: Diagnosis not present

## 2022-01-07 DIAGNOSIS — L84 Corns and callosities: Secondary | ICD-10-CM | POA: Diagnosis not present

## 2022-01-07 DIAGNOSIS — E119 Type 2 diabetes mellitus without complications: Secondary | ICD-10-CM

## 2022-01-07 DIAGNOSIS — B351 Tinea unguium: Secondary | ICD-10-CM

## 2022-01-07 DIAGNOSIS — M79674 Pain in right toe(s): Secondary | ICD-10-CM | POA: Diagnosis not present

## 2022-01-07 DIAGNOSIS — M79675 Pain in left toe(s): Secondary | ICD-10-CM

## 2022-01-07 DIAGNOSIS — M2142 Flat foot [pes planus] (acquired), left foot: Secondary | ICD-10-CM

## 2022-01-07 DIAGNOSIS — M2012 Hallux valgus (acquired), left foot: Secondary | ICD-10-CM

## 2022-01-07 DIAGNOSIS — E1142 Type 2 diabetes mellitus with diabetic polyneuropathy: Secondary | ICD-10-CM | POA: Diagnosis not present

## 2022-01-11 ENCOUNTER — Ambulatory Visit: Payer: Medicare HMO | Admitting: Podiatry

## 2022-01-13 ENCOUNTER — Other Ambulatory Visit (HOSPITAL_BASED_OUTPATIENT_CLINIC_OR_DEPARTMENT_OTHER): Payer: Self-pay

## 2022-01-13 DIAGNOSIS — R0683 Snoring: Secondary | ICD-10-CM

## 2022-01-13 DIAGNOSIS — G4710 Hypersomnia, unspecified: Secondary | ICD-10-CM

## 2022-01-15 NOTE — Progress Notes (Signed)
ANNUAL DIABETIC FOOT EXAM  Subjective: Pamela Roberson presents today for for annual diabetic foot examination.  Patient relates 10 year h/o diabetes.  Patient denies any h/o foot wounds.  Patient has been diagnosed with neuropathy and it is managed with gabapentin.  Patient did not check blood glucose this morning.  Risk factors: diabetes, diabetic neuropathy, HTN.  Glendon Axe, MD is patient's PCP. Last visit was two months ago  Past Medical History:  Diagnosis Date   Arthritis    Asthma    Carotid stenosis, bilateral    Carpal tunnel syndrome    Chronic low back pain    COPD (chronic obstructive pulmonary disease) (HCC)    Diabetes mellitus without complication (HCC)    type II    Diabetic neuropathy (HCC)    Diverticulitis    Dyspnea    mild    Frequent falls    GERD (gastroesophageal reflux disease)    History of bronchitis    Hypercholesterolemia    Hypertension    IBS (irritable bowel syndrome)    Osteoarthritis    knee   Palpitations    Right ventricular enlargement    per pt report   Sleep apnea    mild but no sleep apnea    TMJ (dislocation of temporomandibular joint)    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    Vitamin D deficiency    Patient Active Problem List   Diagnosis Date Noted   Pain in left shoulder 11/16/2021   Internal hemorrhoids 11/03/2021   Leg cramps 11/03/2021   Myalgia 11/03/2021   Adenomatous polyp of colon 12/04/2020   Anal fissure 12/04/2020   Chronic idiopathic constipation 12/04/2020   Constipation 12/04/2020   Dysphagia 12/04/2020   Gastroesophageal reflux disease 12/04/2020   Rectal pain 12/04/2020   Right upper quadrant pain 12/04/2020   Dizzy 12/11/2017   Pharyngeal dysphagia 12/11/2017   Post-nasal drip 12/11/2017   Referred ear pain, bilateral 12/11/2017   Diabetes mellitus without complication ([place]) 48/18/5631   Family history of glaucoma 06/29/2017   Hyperopia of both eyes with astigmatism and  presbyopia 06/29/2017   Keratoconjunctivitis sicca of both eyes not specified as Sjogren's 06/29/2017   Nuclear sclerotic cataract of both eyes 06/29/2017   Vitreous floater, bilateral 06/29/2017   Biceps tendinitis of right shoulder 04/05/2017   Impingement syndrome of right shoulder 04/05/2017   Arthritis of right acromioclavicular joint 04/05/2017   Partial nontraumatic tear of rotator cuff, right 04/05/2017   Past Surgical History:  Procedure Laterality Date   ABDOMINAL HYSTERECTOMY  1993   CARPAL TUNNEL RELEASE Right    CERVICAL [place] SURGERY  04/29/2015   Dr Zianna Sis, [place] N/A 03/03/2021   Procedure: ESOPHAGEAL MANOMETRY (EM);  Surgeon: Wilford Corner, MD;  Location: [facility];  Service: Endoscopy;  Laterality: N/A;   ESOPHAGOGASTRODUODENOSCOPY ENDOSCOPY     with esophagus being stretched twice per pt,    EYE SURGERY     fatty 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      feeding tube placement and removal   2015   NASAL SEPTUM SURGERY     ROTATOR CUFF REPAIR Right    spurs removed from esophagus   2015   TONSILLECTOMY     Current Outpatient Medications on File Prior to Visit  Medication Sig Dispense Refill   Acetaminophen-Codeine 300-30 MG tablet  (Patient not taking: Reported on 01/04/2022)     acyclovir (ZOVIRAX) 400 MG tablet Take 400 mg by  mouth 2 (two) times daily.     albuterol (PROAIR HFA) 108 (90 Base) MCG/ACT inhaler Inhale 1-2 puffs into the lungs every 6 (six) hours as needed for wheezing or shortness of breath. 8 g 0   albuterol (PROVENTIL) (2.5 MG/3ML) 0.083% nebulizer solution Take 3 mLs (2.5 mg total) by nebulization every 4 (four) hours as needed for wheezing or shortness of breath. 30 vial 0   Alcohol Swabs (B-D SINGLE USE SWABS REGULAR) PADS Apply topically.     ALPRAZolam (XANAX) 0.5 MG tablet Take one tab 30-45 minutes prior to MRI, may repeat x 1 if needed before MRI. (Patient not taking: Reported on 01/04/2022) 2 tablet 0    amoxicillin-clavulanate (AUGMENTIN) 875-125 MG tablet Take 1 tablet by mouth every 12 (twelve) hours. (Patient not taking: Reported on 01/04/2022) 14 tablet 0   Artificial Tear Ointment (DRY EYES OP) Place 1-2 drops into both eyes daily as needed (for dry eyes).      Ascorbic Acid (VITAMIN C) 1000 MG tablet Take 1,000 mg by mouth daily.     atorvastatin (LIPITOR) 10 MG tablet Take 10 mg by mouth daily.     Blood Glucose Calibration (TRUE METRIX LEVEL 1) Low SOLN      Blood Glucose Monitoring Suppl (TRUE METRIX METER) w/Device KIT      cholecalciferol (VITAMIN D3) 25 MCG (1000 UNIT) tablet Take 2,000 Units by mouth daily. (Patient not taking: Reported on 01/04/2022)     cyclobenzaprine (FLEXERIL) 10 MG tablet Take 1 tablet (10 mg total) by mouth 2 (two) times daily as needed for muscle spasms. (Patient not taking: Reported on 01/04/2022) 20 tablet 0   cycloSPORINE (RESTASIS) 0.05 % ophthalmic emulsion Place 1 drop into both eyes 2 times daily. (Patient not taking: Reported on 01/04/2022)     cycloSPORINE (RESTASIS) 0.05 % ophthalmic emulsion 1 drop into affected eye (Patient not taking: Reported on 01/04/2022)     dicyclomine (BENTYL) 20 MG tablet Take 20 mg by mouth 3 (three) times daily.     EPINEPHrine 0.1 MG/0.1ML SOAJ Inject as directed See admin instructions.     fexofenadine (ALLEGRA) 180 MG tablet      fluticasone (FLONASE) 50 MCG/ACT nasal spray Place 1 spray into both nostrils daily as needed for allergies or rhinitis.     fluticasone-salmeterol (ADVAIR HFA) 115-21 MCG/ACT inhaler Inhale 2 puffs into the lungs 2 (two) times daily. 1 each 5   gabapentin (NEURONTIN) 300 MG capsule Take 300 mg by mouth 3 (three) times daily.     ipratropium (ATROVENT) 0.06 % nasal spray Place 2 sprays into both nostrils in the morning and at bedtime. 15 mL 5   JANUMET XR 50-500 MG TB24 Take 1 tablet by mouth daily.     ketorolac (ACULAR) 0.5 % ophthalmic solution  (Patient not taking: Reported on 01/04/2022)      meclizine (ANTIVERT) 25 MG tablet Take by mouth.     methylPREDNISolone (MEDROL DOSEPAK) 4 MG TBPK tablet Take 24 mg on day 1, 20 mg on day 2, 16 mg on day 3, 12 mg on day 4, 8 mg on day 5, 4 mg on day 6. (Patient not taking: Reported on 01/04/2022) 21 tablet 0   metoprolol succinate (TOPROL-XL) 25 MG 24 hr tablet Take by mouth.     metoprolol tartrate (LOPRESSOR) 25 MG tablet Take 25 mg by mouth 2 (two) times daily.     montelukast (SINGULAIR) 10 MG tablet Take 10 mg by mouth daily.  Multiple Vitamin (MULTIVITAMIN WITH MINERALS) TABS tablet Take 1 tablet by mouth daily.     nystatin cream (MYCOSTATIN) Apply 1 application topically 2 (two) times daily. (Patient not taking: Reported on 01/04/2022)     ondansetron (ZOFRAN) 4 MG tablet Take 4 mg by mouth every 8 (eight) hours as needed.     ondansetron (ZOFRAN-ODT) 8 MG disintegrating tablet Take 1 tablet (8 mg total) by mouth every 8 (eight) hours as needed for nausea or vomiting. 20 tablet 0   pantoprazole (PROTONIX) 40 MG tablet Take 40 mg by mouth 2 (two) times daily.      promethazine-dextromethorphan (PROMETHAZINE-DM) 6.25-15 MG/5ML syrup Take 2.5 mLs by mouth 4 (four) times daily as needed for cough. 50 mL 0   sucralfate (CARAFATE) 1 g tablet Take 1 g by mouth 4 (four) times daily.      TRUE METRIX BLOOD GLUCOSE TEST test strip 1 each 3 (three) times daily.     TRUEplus Lancets 33G MISC Apply 1 each topically 3 (three) times daily.     [DISCONTINUED] esomeprazole (NEXIUM) 40 MG capsule Take 1 capsule (40 mg total) by mouth daily. (Patient not taking: Reported on 07/08/2020) 30 capsule 0   No current facility-administered medications on file prior to visit.    Allergies  Allergen Reactions   Crab [Shellfish Allergy] Shortness Of Breath and Swelling   Erythromycin Swelling and Rash   Hm Lidocaine Patch [Lidocaine]     Burning on skin   Erdafitinib     Other reaction(s): pt not sure   Fish Allergy     Swelling    Metoclopramide Other  (See Comments)    Slurred speech and unable to move muscles,  Caused her to drag her feet   Sulfacetamide     Other reaction(s): itching, rash   Sulfacetamide Sodium-Sulfur Other (See Comments)    Other reaction(s): itching, rash   Doxycycline     GI Intolerance   Levaquin [Levofloxacin In D5w] Other (See Comments)    Causes irregular heart beat   Morphine Nausea And Vomiting and Rash    Irregular heart beat   Morphine And Related Nausea And Vomiting and Rash    Irregular heart beat   Sulfa Antibiotics Rash   Social History   Occupational History    Comment: NA  Tobacco Use   Smoking status: Former    Packs/day: 0.50    Years: 30.00    Pack years: 15.00    Types: Cigarettes    Quit date: 12/08/1994    Years since quitting: 27.1   Smokeless tobacco: Never  Vaping Use   Vaping Use: Never used  Substance and Sexual Activity   Alcohol use: No   Drug use: No   Sexual activity: Not on file   Family History  Problem Relation Age of Onset   Allergic rhinitis Mother    Diabetes Father        type 2   Heart disease Father    Hypertension Father    Allergic rhinitis Father    Allergic rhinitis Sister    Collagen disease Sister    Heart disease Brother    Hypertension Brother    Angioedema Neg Hx    Asthma Neg Hx    Atopy Neg Hx    Eczema Neg Hx    Immunodeficiency Neg Hx    Urticaria Neg Hx    Immunization History  Administered Date(s) Administered   Influenza,inj,Quad PF,6+ Mos 09/20/2019   Moderna Sars-Covid-2 Vaccination 12/17/2020, 01/28/2021  Pneumococcal Conjugate-13 09/20/2019   Tdap 05/30/2019     Review of Systems: Negative except as noted in the HPI.   Objective: There were no vitals filed for this visit.  Pamela Roberson is a pleasant 66 y.o. female in NAD. AAO X 3.  Vascular Examination: CFT <3 seconds b/l LE. Palpable DP pulse(s) b/l LE. Faintly palpable PT pulse(s) b/l LE. Pedal hair absent. No pain with calf compression b/l. Lower  extremity skin temperature gradient within normal limits. No cyanosis or clubbing noted b/l LE.  Dermatological Examination: Pedal integument with normal turgor, texture and tone b/l LE. No open wounds b/l. No interdigital macerations b/l. Toenails 1-5 b/l elongated, thickened, discolored with subungual debris. +Tenderness with dorsal palpation of nailplates. Hyperkeratotic lesion(s) noted submet head 5 b/l and sub 5th met base right lower extremity.  Musculoskeletal Examination: Muscle strength 5/5 to all lower extremity muscle groups bilaterally. No pain, crepitus or joint limitation noted with ROM bilateral LE. 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  Footwear Assessment: Does the patient wear appropriate shoes? Yes. Does the patient need inserts/orthotics? Yes.  Neurological Examination: Pt has subjective symptoms of neuropathy. Protective sensation intact 5/5 intact bilaterally with 10g monofilament b/l. Vibratory sensation intact b/l.  Assessment: 1. Pain due to onychomycosis of toenails of both feet   2. Callus   3. Hallux valgus, acquired, bilateral   4. Pes planus of both feet   5. Diabetic peripheral neuropathy associated with type 2 diabetes mellitus ([place])   6. Encounter for diabetic foot exam ([place])    ADA Risk Categorization: Low Risk :  Patient has all of the following: Intact protective sensation No prior foot ulcer  No severe deformity Pedal pulses present  Plan: -Diabetic foot examination performed today. -Continue foot and shoe inspections daily. Monitor blood glucose per PCP/Endocrinologist's recommendations. -Mycotic toenails 1-5 bilaterally were debrided in length and girth with sterile nail nippers and dremel without incident. -Callus(es) submet head 5 b/l and sub 5th met base right lower extremity pared utilizing sterile scalpel blade without complication or incident. Total number debrided =3. -Patient/POA to call  should there be question/concern in the interim.  Return in about 3 months (around 04/06/2022).  Marzetta Board, DPM

## 2022-01-24 ENCOUNTER — Emergency Department (HOSPITAL_COMMUNITY)
Admission: EM | Admit: 2022-01-24 | Discharge: 2022-01-24 | Disposition: A | Discharge status: Home / Self Care | Payer: Medicare HMO | Attending: Emergency Medicine | Admitting: Emergency Medicine

## 2022-01-24 ENCOUNTER — Encounter (HOSPITAL_COMMUNITY): Payer: Self-pay

## 2022-01-24 ENCOUNTER — Emergency Department (HOSPITAL_COMMUNITY): Payer: Medicare HMO

## 2022-01-24 ENCOUNTER — Other Ambulatory Visit: Payer: Self-pay

## 2022-01-24 DIAGNOSIS — Z79899 Other long term (current) drug therapy: Secondary | ICD-10-CM | POA: Insufficient documentation

## 2022-01-24 DIAGNOSIS — W19XXXA Unspecified fall, initial encounter: Secondary | ICD-10-CM

## 2022-01-24 DIAGNOSIS — M5441 Lumbago with sciatica, right side: Secondary | ICD-10-CM | POA: Insufficient documentation

## 2022-01-24 DIAGNOSIS — G8929 Other chronic pain: Secondary | ICD-10-CM | POA: Diagnosis not present

## 2022-01-24 DIAGNOSIS — M25462 Effusion, left knee: Secondary | ICD-10-CM

## 2022-01-24 DIAGNOSIS — Z7984 Long term (current) use of oral hypoglycemic drugs: Secondary | ICD-10-CM | POA: Diagnosis not present

## 2022-01-24 DIAGNOSIS — W01198A Fall on same level from slipping, tripping and stumbling with subsequent striking against other object, initial encounter: Secondary | ICD-10-CM | POA: Diagnosis not present

## 2022-01-24 DIAGNOSIS — M545 Low back pain, unspecified: Secondary | ICD-10-CM | POA: Diagnosis present

## 2022-01-24 NOTE — Discharge Instructions (Addendum)
It was a pleasure taking care of you today!  Your x-ray did not show fracture or dislocation.  You may take over-the-counter Voltaren gel and apply it to your left knee and back as needed for no more than 7 days.  You may apply ice to your knee for up to 15 minutes at a time.  Ensure to place a barrier between your knee and the ice.  You may place heat to your back for no more than 15 minutes at a time.  Ensure to place a barrier between your skin and the heat.  Call your neurosurgeon's office in the morning to set up a follow-up appointment regarding today's ED visit.  Return to the ED if experiencing increasing/worsening urinating or having bowel movements on yourself, fever, redness, swelling, worsening symptoms.

## 2022-01-24 NOTE — ED Provider Triage Note (Addendum)
Emergency Medicine Provider Triage Evaluation Note  Pamela Roberson , a 66 y.o. female  was evaluated in triage.  Pt complains of fall onset yesterday.  Denies hitting her head or LOC.  She notes that her left leg had a decreased sensation which caused her to fall.  She notes that she struck her left knee.  Denies swelling, bruise, hitting her head, LOC.  Patient has a history of chronic lower back pain and has a follow-up appointment with her specialist on February 02, 2022.   She notes that she came into the ED because her back pain was becoming unbearable.  Review of Systems  Positive: As per HPI above Negative:   Physical Exam  BP 138/86 (BP Location: Right Arm)    Pulse 86    Temp 98.9 F (37.2 C) (Oral)    Resp 18    Ht 5\' 6"  (1.676 m)    Wt 94.3 kg    SpO2 98%    BMI 33.54 kg/m  Gen:   Awake, no distress   Resp:  Normal effort  MSK:   Moves extremities without difficulty  Other:  Tenderness to palpation noted to lumbar musculature.  Tenderness to palpation noted to medial and lateral aspect of left knee.  Flexion and extension intact of left knee.  Medical Decision Making  Medically screening exam initiated at 5:30 PM.  Appropriate orders placed.  Pamela Roberson was informed that the remainder of the evaluation will be completed by another provider, this initial triage assessment does not replace that evaluation, and the importance of remaining in the ED until their evaluation is complete.    Deborha Moseley A, PA-C 01/24/22 [id number]    Baya Lentz A, PA-C 01/24/22 [id number]

## 2022-01-24 NOTE — ED Provider Notes (Signed)
[facility] DEPT Provider Note   CSN: [id number] Arrival date & time: 01/24/22  1602     History  Chief Complaint  Patient presents with   Fall   Knee Injury   Hip Pain    Pamela Roberson is a 66 y.o. female who presents to the ED complaining of fall onset yesterday.  Denies hitting her head or LOC.  She notes that her left leg had a decreased sensation which caused her to fall.  She notes that she struck her left knee.  Denies swelling, bruise, hitting her head, LOC, bowel/bladder incontinence, saddle paresthesia.  Patient has a history of chronic lower back pain and has a follow-up appointment with her specialist on February 02, 2022.   She notes that she came into the ED because her back pain was becoming unbearable.  The history is provided by the patient. No language interpreter was used.      Home Medications Prior to Admission medications   Medication Sig Start Date End Date Taking? Authorizing Provider  Acetaminophen-Codeine 300-30 MG tablet  12/19/20   [provider]  acyclovir (ZOVIRAX) 400 MG tablet Take 400 mg by mouth 2 (two) times daily.    [provider]  albuterol (PROAIR HFA) 108 (90 Base) MCG/ACT inhaler Inhale 1-2 puffs into the lungs every 6 (six) hours as needed for wheezing or shortness of breath. 06/14/20   Zigmund Gottron, NP  albuterol (PROVENTIL) (2.5 MG/3ML) 0.083% nebulizer solution Take 3 mLs (2.5 mg total) by nebulization every 4 (four) hours as needed for wheezing or shortness of breath. 03/11/19   Raylene Everts, MD  Alcohol Swabs (B-D SINGLE USE SWABS REGULAR) PADS Apply topically. 05/16/21   [provider]  ALPRAZolam Duanne Moron) 0.5 MG tablet Take one tab 30-45 minutes prior to MRI, may repeat x 1 if needed before MRI. Patient not taking: Reported on 01/04/2022 05/24/21   Penni Bombard, MD  amoxicillin-clavulanate (AUGMENTIN) 875-125 MG tablet Take 1 tablet by mouth every 12 (twelve)  hours. Patient not taking: Reported on 01/04/2022 12/04/21   Hazel Sams, PA-C  Artificial Tear Ointment (DRY EYES OP) Place 1-2 drops into both eyes daily as needed (for dry eyes).     [provider]  Ascorbic Acid (VITAMIN C) 1000 MG tablet Take 1,000 mg by mouth daily.    [provider]  atorvastatin (LIPITOR) 10 MG tablet Take 10 mg by mouth daily.    [provider]  Blood Glucose Calibration (TRUE METRIX LEVEL 1) Low SOLN  09/20/21   [provider]  Blood Glucose Monitoring Suppl (TRUE METRIX METER) w/Device KIT  09/20/21   [provider]  cholecalciferol (VITAMIN D3) 25 MCG (1000 UNIT) tablet Take 2,000 Units by mouth daily. Patient not taking: Reported on 01/04/2022    [provider]  cyclobenzaprine (FLEXERIL) 10 MG tablet Take 1 tablet (10 mg total) by mouth 2 (two) times daily as needed for muscle spasms. Patient not taking: Reported on 01/04/2022 04/19/21   Domenic Moras, PA-C  cycloSPORINE (RESTASIS) 0.05 % ophthalmic emulsion Place 1 drop into both eyes 2 times daily. Patient not taking: Reported on 01/04/2022 02/02/21   [provider]  cycloSPORINE (RESTASIS) 0.05 % ophthalmic emulsion 1 drop into affected eye Patient not taking: Reported on 01/04/2022    [provider]  dicyclomine (BENTYL) 20 MG tablet Take 20 mg by mouth 3 (three) times daily. 10/17/19   [provider]  EPINEPHrine 0.1 MG/0.1ML  SOAJ Inject as directed See admin instructions.    [provider]  fexofenadine (ALLEGRA) 180 MG tablet  12/24/20   [provider]  fluticasone (FLONASE) 50 MCG/ACT nasal spray Place 1 spray into both nostrils daily as needed for allergies or rhinitis.    [provider]  fluticasone-salmeterol (ADVAIR HFA) 115-21 MCG/ACT inhaler Inhale 2 puffs into the lungs 2 (two) times daily. 09/23/21   Kennith Gain, MD  gabapentin (NEURONTIN) 300 MG capsule Take 300 mg by mouth  3 (three) times daily.    [provider]  ipratropium (ATROVENT) 0.06 % nasal spray Place 2 sprays into both nostrils in the morning and at bedtime. 01/07/21   Kennith Gain, MD  JANUMET XR 50-500 MG TB24 Take 1 tablet by mouth daily. 09/27/20   [provider]  ketorolac (ACULAR) 0.5 % ophthalmic solution  02/02/21   [provider]  meclizine (ANTIVERT) 25 MG tablet Take by mouth. 08/26/21   [provider]  methylPREDNISolone (MEDROL DOSEPAK) 4 MG TBPK tablet Take 24 mg on day 1, 20 mg on day 2, 16 mg on day 3, 12 mg on day 4, 8 mg on day 5, 4 mg on day 6. Patient not taking: Reported on 01/04/2022 10/02/21   Lynden Oxford Scales, PA-C  metoprolol succinate (TOPROL-XL) 25 MG 24 hr tablet Take by mouth.    [provider]  metoprolol tartrate (LOPRESSOR) 25 MG tablet Take 25 mg by mouth 2 (two) times daily.    [provider]  montelukast (SINGULAIR) 10 MG tablet Take 10 mg by mouth daily.     [provider]  Multiple Vitamin (MULTIVITAMIN WITH MINERALS) TABS tablet Take 1 tablet by mouth daily.    [provider]  nystatin cream (MYCOSTATIN) Apply 1 application topically 2 (two) times daily. Patient not taking: Reported on 01/04/2022 03/18/21   [provider]  ondansetron (ZOFRAN) 4 MG tablet Take 4 mg by mouth every 8 (eight) hours as needed. 02/15/21   [provider]  ondansetron (ZOFRAN-ODT) 8 MG disintegrating tablet Take 1 tablet (8 mg total) by mouth every 8 (eight) hours as needed for nausea or vomiting. 08/28/20   Jaynee Eagles, PA-C  pantoprazole (PROTONIX) 40 MG tablet Take 40 mg by mouth 2 (two) times daily.  11/20/19   [provider]  promethazine-dextromethorphan (PROMETHAZINE-DM) 6.25-15 MG/5ML syrup Take 2.5 mLs by mouth 4 (four) times daily as needed for cough. 12/04/21   Hazel Sams, PA-C  sucralfate (CARAFATE) 1 g tablet Take 1 g by mouth 4 (four) times daily.  02/11/20    [provider]  TRUE METRIX BLOOD GLUCOSE TEST test strip 1 each 3 (three) times daily. 09/20/21   [provider]  TRUEplus Lancets 33G MISC Apply 1 each topically 3 (three) times daily. 09/20/21   [provider]  esomeprazole (NEXIUM) 40 MG capsule Take 1 capsule (40 mg total) by mouth daily. Patient not taking: Reported on 07/08/2020 04/24/19 08/06/20  Charlesetta Shanks, MD      Allergies    Otho Darner allergy], Erythromycin, Hm lidocaine patch [lidocaine], Erdafitinib, Fish allergy, Metoclopramide, Sulfacetamide, Sulfacetamide sodium-sulfur, Doxycycline, Levaquin [levofloxacin in d5w], Morphine, Morphine and related, and Sulfa antibiotics    Review of Systems   Review of Systems  Constitutional:  Negative for chills and fever.  Musculoskeletal:  Positive for arthralgias and back pain. Negative for gait problem and joint swelling.  Skin:  Negative for color change and wound.  Neurological:  Negative for syncope.  All other systems reviewed and are negative.  Physical Exam Updated Vital Signs BP 138/86 (BP Location: Right Arm)    Pulse 86    Temp 98.9 F (37.2 C) (Oral)    Resp 18    Ht 5' 6"  (1.676 m)    Wt 94.3 kg    SpO2 98%    BMI 33.54 kg/m  Physical Exam Vitals and nursing note reviewed.  Constitutional:      General: She is not in acute distress.    Appearance: She is not diaphoretic.  HENT:     Head: Normocephalic and atraumatic.     Mouth/Throat:     Pharynx: No oropharyngeal exudate.  Eyes:     General: No scleral icterus.    Conjunctiva/sclera: Conjunctivae normal.  Cardiovascular:     Rate and Rhythm: Normal rate and regular rhythm.     Pulses: Normal pulses.     Heart sounds: Normal heart sounds.  Pulmonary:     Effort: Pulmonary effort is normal. No respiratory distress.     Breath sounds: Normal breath sounds. No wheezing.  Abdominal:     General: Bowel sounds are normal.     Palpations: Abdomen is soft. There is no mass.      Tenderness: There is no abdominal tenderness. There is no guarding or rebound.  Musculoskeletal:        General: Normal range of motion.     Cervical back: Normal, normal range of motion and neck supple.     Thoracic back: Normal.     Lumbar back: Tenderness present. No bony tenderness.     Right knee: Normal.     Left knee: Effusion present. No swelling, lacerations or bony tenderness. Normal range of motion. Tenderness present over the medial joint line and lateral joint line.     Comments: Tenderness to palpation noted to lumbar musculature.  Tenderness to palpation noted to medial and lateral aspect of left knee.  Flexion and extension intact of left knee. Strength and sensation intact to bilateral lower extremities.  Skin:    General: Skin is warm and dry.  Neurological:     Mental Status: She is alert.  Psychiatric:        Behavior: Behavior normal.    ED Results / Procedures / Treatments   Labs (all labs ordered are listed, but only abnormal results are displayed) Labs Reviewed - No data to display  EKG None  Radiology DG Lumbar Spine Complete  Result Date: 01/24/2022 CLINICAL DATA:  Back pain left knee pain EXAM: LUMBAR SPINE - COMPLETE 4+ VIEW COMPARISON:  None. FINDINGS: No recent fracture is seen. There is mild 4 mm first-degree anterolisthesis at L4-L5 level. Degenerative changes are noted with bony spurs and facet hypertrophy in the lower thoracic spine and lumbar spine. IMPRESSION: No recent fracture is seen. There is first-degree anterolisthesis at L4-L5 level. Degenerative changes are noted in the lumbar spine, more so in the facet joints at L4-L5 and L5-S1 levels. Electronically Signed   By: Elmer Picker M.D.   On: 01/24/2022 18:10   DG Knee 2 Views Left  Result Date: 01/24/2022 CLINICAL DATA:  Pain EXAM: LEFT KNEE - 1-2 VIEW COMPARISON:  None. FINDINGS: No recent fracture or dislocation is seen. There is soft tissue fullness in the suprapatellar bursa. There  is small linear calcification at the attachment of quadriceps tendon to the patella. There is 1 mm calcific density at the attachment of infrapatellar tendon to proximal tibia.  IMPRESSION: No recent fracture or dislocation is seen in the left knee. Possible small effusion is present in the suprapatellar bursa. Faint linear calcification at the attachment of quadriceps tendon to the patella and tiny calcification at the attachment of infrapatellar tendon to the proximal tibia may suggest calcific tendinosis. Electronically Signed   By: Elmer Picker M.D.   On: 01/24/2022 18:07    Procedures Procedures    Medications Ordered in ED Medications - No data to display  ED Course/ Medical Decision Making/ A&P Clinical Course as of 01/24/22 2206  Mon Jan 24, 2022  1836 Discussed imaging findings and discharge treatment plan.  Patient notes that she would like the knee immobilizer to aid with her left knee concerns.  Patient agreeable at this time. [SB]    Clinical Course User Index [SB] Darrian Goodwill A, PA-C                           Medical Decision Making Amount and/or Complexity of Data Reviewed Radiology: ordered.   Patient presents to the ED with concerns for a fall onset yesterday.  Patient notes that her left leg gave out on her causing her to fall and land on her left knee.  She denies hitting her head or LOC.  Patient also notes history of chronic low back pain.  She has a follow-up appointment with her neurosurgeon on February 02, 2022.  Vital signs stable, patient afebrile, not tachycardic or hypoxic.  Patient ambulatory with cane at baseline.  On exam patient with tenderness to palpation noted to lumbar musculature.  Also tenderness to palpation noted to the medial and lateral aspects of left knee.  Full flexion and extension intact of left knee.  Strength and sensation intact to bilateral lower extremities.  Otherwise no acute cardiovascular or respiratory exam findings.  Differential  diagnosis includes fracture, herniation, effusion, septic arthritis, osteoarthritis.   Imaging: I ordered imaging studies including lumbar and left knee x-ray I independently visualized and interpreted imaging which showed: Lumbar x-ray: No acute fracture, herniation, no acute changes overall from recent MRI. Left knee x-ray, small joint effusion otherwise no fracture or dislocation noted. I agree with the radiologist interpretation    Disposition: Patient presentation suspicious for acute on chronic low back pain.  Also concerning for joint effusion to the left knee.  Doubt septic arthritis, no increased warmth, erythema or swelling noted to left knee.  Full active range of motion of left knee as well.  Doubt osteoarthritis, no radiologic findings of osteoarthritis at this time.  Doubt dislocation of left knee.  Doubt herniation of low back.. After consideration of the diagnostic results and the patients response to treatment, I feel that the patient would benefit from discharge home with knee immobilizer.  Encourage patient to keep appointment with her neurosurgeon on March 1 to follow-up regarding today's ED visit.. Supportive care measures and strict return precautions discussed with patient at bedside. Pt acknowledges and verbalizes understanding. Pt appears safe for discharge. Follow up as indicated in discharge paperwork.   This chart was dictated using voice recognition software, Dragon. Despite the best efforts of this provider to proofread and correct errors, errors may still occur which can change documentation meaning.   Final Clinical Impression(s) / ED Diagnoses Final diagnoses:  Fall, initial encounter  Effusion of left knee  Chronic right-sided low back pain with sciatica, sciatica laterality unspecified    Rx / DC Orders ED Discharge Orders  None         Leonel Mccollum A, PA-C 01/24/22 2206    Drenda Freeze, MD 01/24/22 [phone]

## 2022-01-24 NOTE — Progress Notes (Signed)
Orthopedic Tech Progress Note Patient Details:  Pamela Roberson 1956/02/20 [id number]  Ortho Devices Type of Ortho Device: Knee Immobilizer Ortho Device/Splint Location: left Ortho Device/Splint Interventions: Application   Post Interventions Patient Tolerated: Well Instructions Provided: Care of device  [place] Pink 01/24/2022, 6:44 PM

## 2022-01-24 NOTE — ED Triage Notes (Signed)
Patient states when she tries to bear weight on her left leg her left knee "pops". Patient fell yesterday and landed on her her left knee.  Patient also c/o chronic bilateral lower back pain that radiates into her legs and hips.

## 2022-01-26 ENCOUNTER — Ambulatory Visit: Payer: Medicare HMO | Admitting: Podiatry

## 2022-02-14 NOTE — Progress Notes (Unsigned)
Office Visit Note  Patient: Pamela Roberson             Date of Birth: 19-Feb-1956           MRN: [id number]             PCP: Glendon Axe, MD Referring: Glendon Axe, MD Visit Date: 02/15/2022   Subjective:  No chief complaint on file.   History of Present Illness: Pamela Roberson is a 66 y.o. female here for follow up for muscle pains and shoulder pain after left shoulder subacromial bursa steroid injection 3 months ago. ***   Previous HPI 11/03/21 Pamela ZABELLA Roberson is a 66 y.o. female here for myalgias and weakness.  She has a history of right shoulder pain previous rotator cuff arthropathy and bicipital tendinitis also history of dysphagia.  Her current problems are ongoing for a while but seem to be more persistent especially during past several months.  She is experiencing hands becoming cold and locking up with difficulty using her strength and pain.  Is having body aches and muscle cramping occurring in multiple sites especially including upper leg areas.  She has not noticed any specific provoking position or activity is most commonly occurring in the afternoon or evenings but not necessarily when trying to sleep.  She has partial benefit to muscle relaxants or other home muscle cramp remedies but nothing has prevented frequent recurrence of the symptoms.  He has never had involved walking he has not sustained any falls and has no lack of leg strength between episodes. Also having generalized body pains especially after heavier physical activity. She has noticed dizziness or lightheadedness that comes and goes. Peripheral numbness intermittently. She has occasional mild urinary incontinence but normal most of the time.   No Rheumatology ROS completed.   PMFS History:  Patient Active Problem List   Diagnosis Date Noted   Pain in left shoulder 11/16/2021   Internal hemorrhoids 11/03/2021   Leg cramps 11/03/2021   Myalgia 11/03/2021   Adenomatous polyp of colon  12/04/2020   Anal fissure 12/04/2020   Chronic idiopathic constipation 12/04/2020   Constipation 12/04/2020   Dysphagia 12/04/2020   Gastroesophageal reflux disease 12/04/2020   Rectal pain 12/04/2020   Right upper quadrant pain 12/04/2020   Dizzy 12/11/2017   Pharyngeal dysphagia 12/11/2017   Post-nasal drip 12/11/2017   Referred ear pain, bilateral 12/11/2017   Diabetes mellitus without complication ([place]) 28/31/5176   Family history of glaucoma 06/29/2017   Hyperopia of both eyes with astigmatism and presbyopia 06/29/2017   Keratoconjunctivitis sicca of both eyes not specified as Sjogren's 06/29/2017   Nuclear sclerotic cataract of both eyes 06/29/2017   Vitreous floater, bilateral 06/29/2017   Biceps tendinitis of right shoulder 04/05/2017   Impingement syndrome of right shoulder 04/05/2017   Arthritis of right acromioclavicular joint 04/05/2017   Partial nontraumatic tear of rotator cuff, right 04/05/2017    Past Medical History:  Diagnosis Date   Arthritis    Asthma    Carotid stenosis, bilateral    Carpal tunnel syndrome    Chronic low back pain    COPD (chronic obstructive pulmonary disease) (HCC)    Diabetes mellitus without complication ([place])    type II    Diabetic neuropathy (HCC)    Diverticulitis    Dyspnea    mild    Frequent falls    GERD (gastroesophageal reflux disease)    History of bronchitis    Hypercholesterolemia    Hypertension  IBS (irritable bowel syndrome)    Osteoarthritis    knee   Palpitations    Right ventricular enlargement    per pt report   Sleep apnea    mild but no sleep apnea    TMJ (dislocation of temporomandibular joint)    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    Vitamin D deficiency     Family History  Problem Relation Age of Onset   Allergic rhinitis Mother    Diabetes Father        type 2   Heart disease Father    Hypertension Father    Allergic rhinitis Father    Allergic rhinitis Sister    Collagen disease  Sister    Heart disease Brother    Hypertension Brother    Angioedema Neg Hx    Asthma Neg Hx    Atopy Neg Hx    Eczema Neg Hx    Immunodeficiency Neg Hx    Urticaria Neg Hx    Past Surgical History:  Procedure Laterality Date   ABDOMINAL HYSTERECTOMY  1993   CARPAL TUNNEL RELEASE Right    CERVICAL [place] SURGERY  04/29/2015   Dr Claris Sis, [place]   ESOPHAGEAL MANOMETRY N/A 03/03/2021   Procedure: ESOPHAGEAL MANOMETRY (EM);  Surgeon: Wilford Corner, MD;  Location: [facility] END[facility]COPY;  Service: Endoscopy;  Laterality: N/A;   ESOPHAGOGASTRODUODENOSCOPY ENDOSCOPY     with esophagus being stretched twice per pt,    EYE SURGERY     fatty 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   Physical Exam   Musculoskeletal Exam: ***  CDAI Exam: CDAI Score: -- Patient Global: --; Provider Global: -- Swollen: --; Tender: -- Joint Exam 02/15/2022   No joint exam has been documented for this visit   There is currently no information documented on the homunculus. Go to the Rheumatology activity and complete the homunculus joint exam.  Investigation: No additional findings.  Imaging: DG Lumbar Spine Complete  Result Date: 01/24/2022 CLINICAL DATA:  Back pain left knee pain EXAM: LUMBAR SPINE - COMPLETE 4+ VIEW COMPARISON:  None. FINDINGS: No recent fracture is seen. There is mild 4 mm first-degree anterolisthesis at L4-L5 level. Degenerative changes are noted  with bony spurs and facet hypertrophy in the lower thoracic spine and lumbar spine. IMPRESSION: No recent fracture is seen. There is first-degree anterolisthesis at L4-L5 level. Degenerative changes are noted in the lumbar spine, more so in the facet joints at L4-L5 and L5-S1 levels. Electronically Signed   By: Elmer Picker M.D.   On: 01/24/2022 18:10   DG Knee 2 Views Left  Result Date: 01/24/2022 CLINICAL DATA:  Pain EXAM: LEFT KNEE - 1-2 VIEW COMPARISON:  None. FINDINGS: No recent fracture or dislocation is seen. There is soft tissue fullness in the suprapatellar bursa. There is small linear calcification at the attachment of quadriceps tendon to the  patella. There is 1 mm calcific density at the attachment of infrapatellar tendon to proximal tibia. IMPRESSION: No recent fracture or dislocation is seen in the left knee. Possible small effusion is present in the suprapatellar bursa. Faint linear calcification at the attachment of quadriceps tendon to the patella and tiny calcification at the attachment of infrapatellar tendon to the proximal tibia may suggest calcific tendinosis. Electronically Signed   By: Elmer Picker M.D.   On: 01/24/2022 18:07    Recent Labs: Lab Results  Component Value Date   WBC 5.2 01/22/2021   HGB 14.2 01/22/2021   PLT 226 01/22/2021   NA 139 01/22/2021   K 3.9 01/22/2021   CL 106 01/22/2021   CO2 23 01/22/2021   GLUCOSE 123 (H) 01/22/2021   BUN 10 01/22/2021   CREATININE 0.89 01/22/2021   BILITOT 0.7 09/17/2020   ALKPHOS 50 09/17/2020   AST 40 09/17/2020   ALT 24 09/17/2020   PROT 6.0 (L) 09/17/2020   ALBUMIN 3.4 (L) 09/17/2020   CALCIUM 9.3 01/22/2021   GFRAA >60 07/08/2020    Speciality Comments: No specialty comments available.  Procedures:  No procedures performed Allergies: Crab [shellfish allergy], Erythromycin, Hm lidocaine patch [lidocaine], Erdafitinib, Fish allergy, Metoclopramide, Sulfacetamide, Sulfacetamide sodium-sulfur,  Doxycycline, Levaquin [levofloxacin in d5w], Morphine, Morphine and related, and Sulfa antibiotics   Assessment / Plan:     Visit Diagnoses: No diagnosis found.  ***  Orders: No orders of the defined types were placed in this encounter.  No orders of the defined types were placed in this encounter.    Follow-Up Instructions: No follow-ups on file.   Collier Salina, MD  Note - This record has been created using [employer].  Chart creation errors have been sought, but may not always  have been located. Such creation errors do not reflect on  the standard of medical care.

## 2022-02-15 ENCOUNTER — Ambulatory Visit (INDEPENDENT_AMBULATORY_CARE_PROVIDER_SITE_OTHER): Payer: Medicare HMO | Admitting: Internal Medicine

## 2022-02-15 ENCOUNTER — Encounter: Payer: Self-pay | Admitting: Internal Medicine

## 2022-02-15 ENCOUNTER — Other Ambulatory Visit: Payer: Self-pay

## 2022-02-15 VITALS — BP 133/86 | HR 88 | Resp 15 | Ht 66.0 in | Wt 211.0 lb

## 2022-02-15 DIAGNOSIS — M5432 Sciatica, left side: Secondary | ICD-10-CM

## 2022-02-15 DIAGNOSIS — M25562 Pain in left knee: Secondary | ICD-10-CM

## 2022-02-15 DIAGNOSIS — G8929 Other chronic pain: Secondary | ICD-10-CM

## 2022-02-15 DIAGNOSIS — M25512 Pain in left shoulder: Secondary | ICD-10-CM | POA: Diagnosis not present

## 2022-02-15 MED ORDER — LIDOCAINE HCL 1 % IJ SOLN
4.0000 mL | INTRAMUSCULAR | Status: AC | PRN
  Administered 2022-02-15: 4 mL

## 2022-02-15 MED ORDER — TRIAMCINOLONE ACETONIDE 40 MG/ML IJ SUSP
40.0000 mg | INTRAMUSCULAR | Status: AC | PRN
  Administered 2022-02-15: 40 mg via INTRA_ARTICULAR

## 2022-03-01 ENCOUNTER — Other Ambulatory Visit: Payer: Self-pay | Admitting: Family Medicine

## 2022-03-01 DIAGNOSIS — Z1231 Encounter for screening mammogram for malignant neoplasm of breast: Secondary | ICD-10-CM

## 2022-03-06 ENCOUNTER — Other Ambulatory Visit: Payer: Self-pay

## 2022-03-06 ENCOUNTER — Ambulatory Visit (HOSPITAL_BASED_OUTPATIENT_CLINIC_OR_DEPARTMENT_OTHER): Payer: Medicare HMO | Attending: Internal Medicine | Admitting: Internal Medicine

## 2022-03-06 VITALS — Ht 60.0 in | Wt 212.0 lb

## 2022-03-06 DIAGNOSIS — E119 Type 2 diabetes mellitus without complications: Secondary | ICD-10-CM | POA: Diagnosis not present

## 2022-03-06 DIAGNOSIS — G4733 Obstructive sleep apnea (adult) (pediatric): Secondary | ICD-10-CM

## 2022-03-06 DIAGNOSIS — R0683 Snoring: Secondary | ICD-10-CM | POA: Diagnosis not present

## 2022-03-06 DIAGNOSIS — I10 Essential (primary) hypertension: Secondary | ICD-10-CM | POA: Insufficient documentation

## 2022-03-06 DIAGNOSIS — G4710 Hypersomnia, unspecified: Secondary | ICD-10-CM | POA: Diagnosis not present

## 2022-03-16 DIAGNOSIS — R0683 Snoring: Secondary | ICD-10-CM

## 2022-03-16 NOTE — Procedures (Signed)
? ? ? ?  Patient Name: Pamela Roberson, Pamela Roberson ?Study Date: 03/06/2022 ?Gender: Female ?D.O.B: 17-Jun-1956 ?Age (years): 21 ?Referring Provider: Glendon Axe MD ?Height (inches): 60 ?Interpreting Physician: Baird Lyons MD, ABSM ?Weight (lbs): 212 ?RPSGT: Steffey, Lennette Bihari ?BMI: 41 ?MRN: [id number] ? ?CLINICAL INFORMATION ?Sleep Study Type: NPSG ?Indication for sleep study: Diabetes, Hypertension ?Epworth Sleepiness Score: 2 ? ?SLEEP STUDY TECHNIQUE ?As per the AASM Manual for the Scoring of Sleep and Associated Events v2.3 (April 2016) with a hypopnea requiring 4% desaturations. ? ?The channels recorded and monitored were frontal, central and occipital EEG, electrooculogram (EOG), submentalis EMG (chin), nasal and oral airflow, thoracic and abdominal wall motion, anterior tibialis EMG, snore microphone, electrocardiogram, and pulse oximetry. ? ?MEDICATIONS ?Medications self-administered by patient taken the night of the study : none reported ? ?SLEEP ARCHITECTURE ?The study was initiated at 10:05:07 PM and ended at 4:32:23 AM. ? ?Sleep onset time was 50.7 minutes and the sleep efficiency was 69.2%%. The total sleep time was 268 minutes. ? ?Stage REM latency was 98.0 minutes. ? ?The patient spent 3.7%% of the night in stage N1 sleep, 70.1%% in stage N2 sleep, 0.0%% in stage N3 and 26.1% in REM. ? ?Alpha intrusion was absent. ? ?Supine sleep was 100.00%. ? ?RESPIRATORY PARAMETERS ?The overall apnea/hypopnea index (AHI) was 6.9 per hour. There were 0 total apneas, including 0 obstructive, 0 central and 0 mixed apneas. There were 31 hypopneas and 2 RERAs. ? ?The AHI during Stage REM sleep was 25.7 per hour. ? ?AHI while supine was 6.9 per hour. ? ?The mean oxygen saturation was 93.6%. The minimum SpO2 during sleep was 82.0%. ? ?soft snoring was noted during this study. ? ?CARDIAC DATA ?The 2 lead EKG demonstrated sinus rhythm. The mean heart rate was 74.8 beats per minute. Other EKG findings include: None. ? ?LEG MOVEMENT  DATA ?The total PLMS were 0 with a resulting PLMS index of 0.0. Associated arousal with leg movement index was 0.0 . ? ?IMPRESSIONS ?- Mild obstructive sleep apnea occurred during this study (AHI = 6.9/h). ?- Mild oxygen desaturation was noted during this study (Min O2 = 82.0%). Mean 94.3% ?- The patient snored with soft snoring volume. ?- No cardiac abnormalities were noted during this study. ?- Clinically significant periodic limb movements did not occur during sleep. No significant associated arousals. ? ?DIAGNOSIS ?- Obstructive Sleep Apnea (G47.33) ? ?RECOMMENDATIONS ?- Treatment for very mild OSA is directed by symptoms and co-morbidities. Conservative measures may include observation, weight loss and sleep position off back. Other options, including CPAP, a fitted oral appliance, or ENT evaluation, are based on clinical judgment. ?- Be careful with alcohol, sedatives and other CNS depressants that may worsen sleep apnea and disrupt normal sleep architecture. ?- Sleep hygiene should be reviewed to assess factors that may improve sleep quality. ?- Weight management and regular exercise should be initiated or continued if appropriate. ? ?[Electronically signed] 03/16/2022 02:16 PM ? ?Baird Lyons MD, ABSM ?Diplomate, Tax adviser of Sleep Medicine ?NPI: [id number] ?  ? ? ? ? ? ? ? ? ? ? ? ? ? ? ? ? ? ? ? ? ? ?Toluwanimi Radebaugh ?Diplomate, Tax adviser of Sleep Medicine ? ?ELECTRONICALLY SIGNED ON:  03/16/2022, 2:12 PM ?[place] ?PH: [phone]   FX: [phone] ?ACCREDITED BY THE [employer]ICAN [employer] SLE[employer]IC[employer] ?

## 2022-03-23 ENCOUNTER — Ambulatory Visit
Admission: EM | Admit: 2022-03-23 | Discharge: 2022-03-23 | Disposition: A | Discharge status: Home / Self Care | Payer: Medicare HMO | Attending: Family Medicine | Admitting: Family Medicine

## 2022-03-23 DIAGNOSIS — L500 Allergic urticaria: Secondary | ICD-10-CM | POA: Diagnosis not present

## 2022-03-23 DIAGNOSIS — J309 Allergic rhinitis, unspecified: Secondary | ICD-10-CM | POA: Diagnosis not present

## 2022-03-23 DIAGNOSIS — K121 Other forms of stomatitis: Secondary | ICD-10-CM | POA: Diagnosis not present

## 2022-03-23 MED ORDER — NYSTATIN 100000 UNIT/ML MT SUSP: 5.0000 mL | Freq: Four times a day (QID) | OROMUCOSAL | 0 refills | Status: DC

## 2022-03-23 MED ORDER — LEVOCETIRIZINE DIHYDROCHLORIDE 5 MG PO TABS: 5.0000 mg | ORAL_TABLET | Freq: Every evening | ORAL | 0 refills | Status: DC

## 2022-03-23 MED ORDER — DEXAMETHASONE SODIUM PHOSPHATE 10 MG/ML IJ SOLN
5.0000 mg | Freq: Once | INTRAMUSCULAR | Status: AC
  Administered 2022-03-23: 5 mg via INTRAMUSCULAR

## 2022-03-23 NOTE — ED Provider Notes (Signed)
[facility]    CSN: [id number] Arrival date & time: 03/23/22  1606      History   Chief Complaint Chief Complaint  Patient presents with   Allergies    HPI Pamela Roberson is a 66 y.o. female.   HPI Patient with a history of asthma and chronic allergic rhinitis presents today with 3-week history allergy symptoms.  She reports she is followed by an allergist.  Current symptoms include wheezing which is caused her to have increased use of her inhaler.  She also complains of generalized body itching although there is no specific rash. She is also had itchy eyes and nasal drainage along with a headache which is localized to the frontal region of her head.  Since using her inhaler she has developed some ulcerations in her mouth which are painful.  She reports compliance with all of her medication.  Denies any difficulty breathing or chest pain.  Past Medical History:  Diagnosis Date   Arthritis    Asthma    Carotid stenosis, bilateral    Carpal tunnel syndrome    Chronic low back pain    COPD (chronic obstructive pulmonary disease) (HCC)    Diabetes mellitus without complication (HCC)    type II    Diabetic neuropathy (HCC)    Diverticulitis    Dyspnea    mild    Frequent falls    GERD (gastroesophageal reflux disease)    History of bronchitis    Hypercholesterolemia    Hypertension    IBS (irritable bowel syndrome)    Osteoarthritis    knee   Palpitations    Right ventricular enlargement    per pt report   Sleep apnea    mild but no sleep apnea    TMJ (dislocation of temporomandibular joint)    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    Vitamin D deficiency     Patient Active Problem List   Diagnosis Date Noted   Pain in left knee 02/15/2022   Sciatica, left side 02/15/2022   Pain in left shoulder 11/16/2021   Internal hemorrhoids 11/03/2021   Leg cramps 11/03/2021   Myalgia 11/03/2021   Adenomatous polyp of colon 12/04/2020   Anal fissure  12/04/2020   Chronic idiopathic constipation 12/04/2020   Constipation 12/04/2020   Dysphagia 12/04/2020   Gastroesophageal reflux disease 12/04/2020   Rectal pain 12/04/2020   Right upper quadrant pain 12/04/2020   Dizzy 12/11/2017   Pharyngeal dysphagia 12/11/2017   Post-nasal drip 12/11/2017   Referred ear pain, bilateral 12/11/2017   Diabetes mellitus without complication (HCC) 06/29/2017   Family history of glaucoma 06/29/2017   Hyperopia of both eyes with astigmatism and presbyopia 06/29/2017   Keratoconjunctivitis sicca of both eyes not specified as Sjogren's 06/29/2017   Nuclear sclerotic cataract of both eyes 06/29/2017   Vitreous floater, bilateral 06/29/2017   Biceps tendinitis of right shoulder 04/05/2017   Impingement syndrome of right shoulder 04/05/2017   Arthritis of right acromioclavicular joint 04/05/2017   Partial nontraumatic tear of rotator cuff, right 04/05/2017    Past Surgical History:  Procedure Laterality Date   ABDOMINAL HYSTERECTOMY  1993   CARPAL TUNNEL RELEASE Right    CERVICAL DISC SURGERY  04/29/2015   Dr [place]rie [place]   ESOPHAGEAL MANOMETRY N/A 03/03/2021   Procedure: ESOPHAGEAL MANOMETRY (EM);  Surgeon: Charlott Rakes, MD;  Location: [facility];  Service: Endoscopy;  Laterality: N/A;   ESOPHAGOGASTRODUODENOSCOPY ENDOSCOPY     with  esophagus being stretched twice per pt,    EYE SURGERY     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      Home Medications    Prior to Admission medications   Medication Sig Start Date End Date Taking? Authorizing Provider  levocetirizine (XYZAL) 5 MG tablet Take 1 tablet (5 mg total) by mouth every evening. 03/23/22  Yes Bing Neighbors, FNP  nystatin (MYCOSTATIN) 100000 UNIT/ML suspension Take  5 mLs (500,000 Units total) by mouth 4 (four) times daily. 03/23/22  Yes Bing Neighbors, FNP  Acetaminophen-Codeine 300-30 MG tablet  12/19/20   [provider]  acyclovir (ZOVIRAX) 400 MG tablet Take 400 mg by mouth 2 (two) times daily.    [provider]  albuterol (PROAIR HFA) 108 (90 Base) MCG/ACT inhaler Inhale 1-2 puffs into the lungs every 6 (six) hours as needed for wheezing or shortness of breath. 06/14/20   Georgetta Haber, NP  albuterol (PROVENTIL) (2.5 MG/3ML) 0.083% nebulizer solution Take 3 mLs (2.5 mg total) by nebulization every 4 (four) hours as needed for wheezing or shortness of breath. 03/11/19   Eustace Moore, MD  Alcohol Swabs (B-D SINGLE USE SWABS REGULAR) PADS Apply topically. 05/16/21   [provider]  ALPRAZolam Prudy Feeler) 0.5 MG tablet Take one tab 30-45 minutes prior to MRI, may repeat x 1 if needed before MRI. Patient not taking: Reported on 01/04/2022 05/24/21   Suanne Marker, MD  amoxicillin-clavulanate (AUGMENTIN) 875-125 MG tablet Take 1 tablet by mouth every 12 (twelve) hours. Patient not taking: Reported on 01/04/2022 12/04/21   Rhys Martini, PA-C  Artificial Tear Ointment (DRY EYES OP) Place 1-2 drops into both eyes daily as needed (for dry eyes).     [provider]  Ascorbic Acid (VITAMIN C) 1000 MG tablet Take 1,000 mg by mouth daily.    [provider]  atorvastatin (LIPITOR) 10 MG tablet Take 10 mg by mouth daily.    [provider]  Blood Glucose Calibration (TRUE METRIX LEVEL 1) Low SOLN  09/20/21   [provider]  Blood Glucose Monitoring Suppl (TRUE METRIX METER) w/Device KIT  09/20/21   [provider]  cholecalciferol (VITAMIN D3) 25 MCG (1000 UNIT) tablet Take 2,000 Units by mouth daily.    [provider]  cyclobenzaprine (FLEXERIL) 10 MG tablet Take 1 tablet (10 mg total) by mouth 2 (two) times daily as needed for muscle spasms. Patient not taking: Reported on  01/04/2022 04/19/21   Fayrene Helper, PA-C  cycloSPORINE (RESTASIS) 0.05 % ophthalmic emulsion  02/02/21   [provider]  cycloSPORINE (RESTASIS) 0.05 % ophthalmic emulsion 1 drop into affected eye Patient not taking: Reported on 01/04/2022    [provider]  dicyclomine (BENTYL) 20 MG tablet Take 20 mg by mouth 3 (three) times daily. 10/17/19   [provider]  EPINEPHrine 0.1 MG/0.1ML SOAJ Inject as directed See admin instructions.    [provider]  fexofenadine (ALLEGRA) 180 MG tablet  12/24/20   [provider]  fluticasone (FLONASE) 50 MCG/ACT nasal spray Place 1 spray into both nostrils daily as needed for allergies or rhinitis.    [provider]  fluticasone-salmeterol (ADVAIR HFA) 115-21 MCG/ACT inhaler Inhale 2 puffs into the lungs 2 (two) times daily. 09/23/21   Marcelyn Bruins, MD  gabapentin (NEURONTIN) 300 MG capsule Take 300 mg by mouth 3 (three) times daily.    [provider]  ipratropium (ATROVENT) 0.06 % nasal spray Place 2 sprays into both nostrils in the morning and at bedtime. Patient not taking: Reported on 02/15/2022 01/07/21   Marcelyn Bruins, MD  JANUMET XR 50-500 MG TB24 Take 1 tablet by mouth daily. 09/27/20   [provider]  ketorolac (ACULAR) 0.5 % ophthalmic solution  02/02/21   [provider]  meclizine (ANTIVERT) 25 MG tablet Take by mouth. 08/26/21   [provider]  methylPREDNISolone (MEDROL DOSEPAK) 4 MG TBPK tablet Take 24 mg on day 1, 20 mg on day 2, 16 mg on day 3, 12 mg on day 4, 8 mg on day 5, 4 mg on day 6. Patient not taking: Reported on 01/04/2022 10/02/21   Theadora Rama Scales, PA-C  metoprolol succinate (TOPROL-XL) 25 MG 24 hr tablet Take by mouth. Patient not taking: Reported on 02/15/2022    [provider]  metoprolol tartrate (LOPRESSOR) 25 MG tablet Take 25 mg by mouth 2 (two) times daily.    [provider]  montelukast  (SINGULAIR) 10 MG tablet Take 10 mg by mouth daily.     [provider]  Multiple Vitamin (MULTIVITAMIN WITH MINERALS) TABS tablet Take 1 tablet by mouth daily.    [provider]  nystatin cream (MYCOSTATIN) Apply 1 application. topically 2 (two) times daily. 03/18/21   [provider]  ondansetron (ZOFRAN) 4 MG tablet Take 4 mg by mouth every 8 (eight) hours as needed. 02/15/21   [provider]  ondansetron (ZOFRAN-ODT) 8 MG disintegrating tablet Take 1 tablet (8 mg total) by mouth every 8 (eight) hours as needed for nausea or vomiting. Patient not taking: Reported on 02/15/2022 08/28/20   Wallis Bamberg, PA-C  pantoprazole (PROTONIX) 40 MG tablet Take 40 mg by mouth 2 (two) times daily.  11/20/19   [provider]  promethazine-dextromethorphan (PROMETHAZINE-DM) 6.25-15 MG/5ML syrup Take 2.5 mLs by mouth 4 (four) times daily as needed for cough. 12/04/21   Rhys Martini, PA-C  sucralfate (CARAFATE) 1 g tablet Take 1 g by mouth 4 (four) times daily.  02/11/20   [provider]  TRUE METRIX BLOOD GLUCOSE TEST test strip 1 each 3 (three) times daily. 09/20/21   [provider]  TRUEplus Lancets 33G MISC Apply 1 each topically 3 (three) times daily. 09/20/21   [provider]  esomeprazole (NEXIUM) 40 MG capsule Take 1 capsule (40 mg total) by mouth daily. Patient not taking: Reported on 07/08/2020 04/24/19 08/06/20  Arby Barrette, MD    Family History Family History  Problem Relation Age of Onset   Allergic rhinitis Mother    Diabetes Father        type 2   Heart disease Father    Hypertension Father    Allergic rhinitis Father    Allergic rhinitis Sister    Collagen disease Sister    Heart disease Brother    Hypertension Brother    Angioedema Neg Hx    Asthma Neg Hx    Atopy Neg Hx    Eczema Neg Hx    Immunodeficiency Neg Hx    Urticaria Neg Hx     Social History Social History   Tobacco Use   Smoking status:  Former  Packs/day: 0.50    Years: 30.00    Pack years: 15.00    Types: Cigarettes    Quit date: 12/08/1994    Years since quitting: 27.3   Smokeless tobacco: Never  Vaping Use   Vaping Use: Never used  Substance Use Topics   Alcohol use: No   Drug use: No     Allergies   Crab [shellfish allergy], Erythromycin, Hm lidocaine patch [lidocaine], Erdafitinib, Fish allergy, Metoclopramide, Sulfacetamide, Sulfacetamide sodium-sulfur, Doxycycline, Levaquin [levofloxacin in d5w], Morphine, Morphine and related, and Sulfa antibiotics   Review of Systems Review of Systems Pertinent negatives listed in HPI  Physical Exam Triage Vital Signs ED Triage Vitals [03/23/22 1629]  Enc Vitals Group     BP 136/83     Pulse Rate 79     Resp 18     Temp 98.3 F (36.8 C)     Temp Source Oral     SpO2 97 %     Weight      Height      Head Circumference      Peak Flow      Pain Score 0     Pain Loc      Pain Edu?      Excl. in GC?    No data found.  Updated Vital Signs BP 136/83 (BP Location: Left Arm)   Pulse 79   Temp 98.3 F (36.8 C) (Oral)   Resp 18   SpO2 97%   Visual Acuity Right Eye Distance:   Left Eye Distance:   Bilateral Distance:    Right Eye Near:   Left Eye Near:    Bilateral Near:     Physical Exam  General Appearance:    Alert, cooperative, no distress  HENT:   Normocephalic, ears normal, nares mucosal edema with congestion, rhinorrhea, oropharynx patent  Eyes:    PERRL, conjunctiva/corneas clear, EOM's intact       Lungs:     Clear to auscultation bilaterally, respirations unlabored  Heart:    Regular rate and rhythm  Neurologic:   Awake, alert, oriented x 3. No apparent focal neurological           defect.      UC Treatments / Results  Labs (all labs ordered are listed, but only abnormal results are displayed) Labs Reviewed - No data to display  EKG   Radiology No results found.  Procedures Procedures (including critical care  time)  Medications Ordered in UC Medications  dexamethasone (DECADRON) injection 5 mg (5 mg Intramuscular Given 03/23/22 1710)    Initial Impression / Assessment and Plan / UC Course  I have reviewed the triage vital signs and the nursing notes.  Pertinent labs & imaging results that were available during my care of the patient were reviewed by me and considered in my medical decision making (see chart for details).    Allergic urticaria,  mouth ulcers , nonallergic rhinitis Decadron IM 5 mg given here in clinic. Recommend a trial of levocetirizine 5 mg daily at bedtime.  For oral ulcers recommend nystatin mouth rinses.  Schedule follow-up with your allergist.  Strict return precautions given if symptoms worsen or do not improve. Final Clinical Impressions(s) / UC Diagnoses   Final diagnoses:  Allergic urticaria  Mouth ulcers  Allergic rhinitis, unspecified seasonality, unspecified trigger     Discharge Instructions      Swish and spit 5 mL nystatin swish and spit every up to 4 times daily as needed for  oral irritation Start levocetirizine at bedtime this is to help with the itching and allergy symptoms. Do not take any additional allergy medication with this medication.     ED Prescriptions     Medication Sig Dispense Auth. Provider   levocetirizine (XYZAL) 5 MG tablet Take 1 tablet (5 mg total) by mouth every evening. 30 tablet Bing Neighbors, FNP   nystatin (MYCOSTATIN) 100000 UNIT/ML suspension Take 5 mLs (500,000 Units total) by mouth 4 (four) times daily. 60 mL Bing Neighbors, FNP      PDMP not reviewed this encounter.   Bing Neighbors, [place] 03/25/22 2057

## 2022-03-23 NOTE — ED Triage Notes (Signed)
Pt c/o generalized pruritis, gritty pruritic eyes, nasal drainage, headache, onset ~3 weeks ago. Has hx of allergies. Has been using inhaler for wheezing but denies any medicine for allergies. Pt also reports ulcers in mouth.  ?

## 2022-03-23 NOTE — Discharge Instructions (Addendum)
Swish and spit 5 mL nystatin swish and spit every up to 4 times daily as needed for oral irritation ?Start levocetirizine at bedtime this is to help with the itching and allergy symptoms. ?Do not take any additional allergy medication with this medication. ?

## 2022-03-29 ENCOUNTER — Ambulatory Visit
Admission: RE | Admit: 2022-03-29 | Discharge: 2022-03-29 | Disposition: A | Discharge status: Home / Self Care | Payer: Medicare HMO | Source: Ambulatory Visit | Attending: Family Medicine | Admitting: Family Medicine

## 2022-03-29 DIAGNOSIS — Z1231 Encounter for screening mammogram for malignant neoplasm of breast: Secondary | ICD-10-CM

## 2022-04-11 ENCOUNTER — Emergency Department (HOSPITAL_COMMUNITY)
Admission: EM | Admit: 2022-04-11 | Discharge: 2022-04-11 | Disposition: A | Discharge status: Home / Self Care | Payer: Medicare HMO | Attending: Emergency Medicine | Admitting: Emergency Medicine

## 2022-04-11 ENCOUNTER — Encounter (HOSPITAL_COMMUNITY): Payer: Self-pay

## 2022-04-11 DIAGNOSIS — R0989 Other specified symptoms and signs involving the circulatory and respiratory systems: Secondary | ICD-10-CM | POA: Diagnosis not present

## 2022-04-11 DIAGNOSIS — Z7951 Long term (current) use of inhaled steroids: Secondary | ICD-10-CM | POA: Diagnosis not present

## 2022-04-11 DIAGNOSIS — I10 Essential (primary) hypertension: Secondary | ICD-10-CM | POA: Insufficient documentation

## 2022-04-11 DIAGNOSIS — Z87891 Personal history of nicotine dependence: Secondary | ICD-10-CM | POA: Insufficient documentation

## 2022-04-11 DIAGNOSIS — R0602 Shortness of breath: Secondary | ICD-10-CM | POA: Insufficient documentation

## 2022-04-11 DIAGNOSIS — Z79899 Other long term (current) drug therapy: Secondary | ICD-10-CM | POA: Diagnosis not present

## 2022-04-11 DIAGNOSIS — J449 Chronic obstructive pulmonary disease, unspecified: Secondary | ICD-10-CM | POA: Insufficient documentation

## 2022-04-11 DIAGNOSIS — E119 Type 2 diabetes mellitus without complications: Secondary | ICD-10-CM | POA: Diagnosis not present

## 2022-04-11 DIAGNOSIS — L299 Pruritus, unspecified: Secondary | ICD-10-CM | POA: Diagnosis not present

## 2022-04-11 DIAGNOSIS — Z7984 Long term (current) use of oral hypoglycemic drugs: Secondary | ICD-10-CM | POA: Diagnosis not present

## 2022-04-11 DIAGNOSIS — J45909 Unspecified asthma, uncomplicated: Secondary | ICD-10-CM | POA: Insufficient documentation

## 2022-04-11 DIAGNOSIS — T7840XA Allergy, unspecified, initial encounter: Secondary | ICD-10-CM | POA: Diagnosis present

## 2022-04-11 MED ORDER — DIPHENHYDRAMINE HCL 25 MG PO CAPS
25.0000 mg | ORAL_CAPSULE | Freq: Once | ORAL | Status: AC
  Administered 2022-04-11: 25 mg via ORAL
  Filled 2022-04-11: qty 1

## 2022-04-11 MED ORDER — METHYLPREDNISOLONE 4 MG PO TBPK: ORAL_TABLET | ORAL | 0 refills | Status: DC

## 2022-04-11 MED ORDER — FAMOTIDINE 20 MG PO TABS
20.0000 mg | ORAL_TABLET | Freq: Once | ORAL | Status: AC
  Administered 2022-04-11: 20 mg via ORAL
  Filled 2022-04-11: qty 1

## 2022-04-11 NOTE — ED Provider Notes (Signed)
? ?[facility] ?Provider Note: Georgena Spurling, MD, FACEP ? ?CSN: [id number] ?MRN: [id number] ?ARRIVAL: 04/11/22 at 0117 ?ROOM: [facility] ? ? ?CHIEF COMPLAINT  ?Allergic Reaction ? ? ?HISTORY OF PRESENT ILLNESS  ?04/11/22 2:17 AM ?Pamela Roberson is a 66 y.o. female whose daughter mowed the grass 2 days ago.  During this she opened the door and the patient was exposed to grass clippings.  This caused her to have a scratchy throat and subsequently difficulty breathing.  She has used her Advair inhaler with relief of her shortness of breath but she still feels like her throat is tight and itchy.  She has not taken any antihistamines.  She is requesting a steroid taper which has helped her in the past. ? ? ?Past Medical History:  ?Diagnosis Date  ? Arthritis   ? Asthma   ? Carotid stenosis, bilateral   ? Carpal tunnel syndrome   ? Chronic low back pain   ? COPD (chronic obstructive pulmonary disease) ([place])   ? Diabetes mellitus without complication ([place])   ? type II   ? Diabetic neuropathy ([place])   ? Diverticulitis   ? Dyspnea   ? mild   ? Frequent falls   ? GERD (gastroesophageal reflux disease)   ? History of bronchitis   ? Hypercholesterolemia   ? Hypertension   ? IBS (irritable bowel syndrome)   ? Osteoarthritis   ? knee  ? Palpitations   ? Right ventricular enlargement   ? per pt report  ? Sleep apnea   ? mild but no sleep apnea   ? TMJ (dislocation of temporomandibular joint)   ? Tuberculosis   ? 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 Vitamin D deficiency   ? ? ?Past Surgical History:  ?Procedure Laterality Date  ? ABDOMINAL HYSTERECTOMY  1993  ? CARPAL TUNNEL RELEASE Right   ? CERVICAL DISC SURGERY  04/29/2015  ? Dr Bianney Sis, [place]  ? ESOPHAGEAL MANOMETRY N/A 03/03/2021  ? Procedure: ESOPHAGEAL MANOMETRY (EM);  Surgeon: Wilford Corner, MD;  Location: [facility];  Service: Endoscopy;  Laterality: N/A;  ? ESOPHAGOGASTRODUODENOSCOPY ENDOSCOPY    ? with esophagus being stretched twice per pt,   ? EYE SURGERY     ? fatty 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     ? feeding tube placement and removal   2015  ? NASAL SEPTUM SURGERY    ? ROTATOR CUFF REPAIR Right   ? spurs removed from esophagus   2015  ? TONSILLECTOMY    ? ? ?Family History  ?Problem Relation Age of Onset  ? Allergic rhinitis Mother   ? Diabetes Father   ?     type 2  ? Heart disease Father   ? Hypertension Father   ? Allergic rhinitis Father   ? Allergic rhinitis Sister   ? Collagen disease Sister   ? Heart disease Brother   ? Hypertension Brother   ? Angioedema Neg Hx   ? Asthma Neg Hx   ? Atopy Neg Hx   ? Eczema Neg Hx   ? Immunodeficiency Neg Hx   ? Urticaria Neg Hx   ? ? ?Social History  ? ?Tobacco Use  ? Smoking status: Former  ?  Packs/day: 0.50  ?  Years: 30.00  ?  Pack years: 15.00  ?  Types: Cigarettes  ?  Quit date: 12/08/1994  ?  Years since quitting: 27.3  ? Smokeless tobacco: Never  ?Vaping Use  ? Vaping Use: Never used  ?  Substance Use Topics  ? Alcohol use: No  ? Drug use: No  ? ? ?Prior to Admission medications   ?Medication Sig Start Date End Date Taking? Authorizing Provider  ?acyclovir (ZOVIRAX) 400 MG tablet Take 400 mg by mouth 2 (two) times daily.    [provider]  ?albuterol (PROAIR HFA) 108 (90 Base) MCG/ACT inhaler Inhale 1-2 puffs into the lungs every 6 (six) hours as needed for wheezing or shortness of breath. 06/14/20   Zigmund Gottron, NP  ?albuterol (PROVENTIL) (2.5 MG/3ML) 0.083% nebulizer solution Take 3 mLs (2.5 mg total) by nebulization every 4 (four) hours as needed for wheezing or shortness of breath. 03/11/19   Raylene Everts, MD  ?Alcohol Swabs (B-D SINGLE USE SWABS REGULAR) PADS Apply topically. 05/16/21   [provider]  ?Artificial Tear Ointment (DRY EYES OP) Place 1-2 drops into both eyes daily as needed (for dry eyes).     [provider]  ?Ascorbic Acid (VITAMIN C) 1000 MG tablet Take 1,000 mg by mouth daily.    [provider]  ?atorvastatin (LIPITOR) 10 MG tablet Take 10 mg by mouth  daily.    [provider]  ?Blood Glucose Calibration (TRUE METRIX LEVEL 1) Low SOLN  09/20/21   [provider]  ?Blood Glucose Monitoring Suppl (TRUE METRIX METER) w/Device KIT  09/20/21   [provider]  ?cholecalciferol (VITAMIN D3) 25 MCG (1000 UNIT) tablet Take 2,000 Units by mouth daily.    [provider]  ?dicyclomine (BENTYL) 20 MG tablet Take 20 mg by mouth 3 (three) times daily. 10/17/19   [provider]  ?EPINEPHrine 0.1 MG/0.1ML SOAJ Inject as directed See admin instructions.    [provider]  ?fluticasone (FLONASE) 50 MCG/ACT nasal spray Place 1 spray into both nostrils daily as needed for allergies or rhinitis.    [provider]  ?fluticasone-salmeterol (ADVAIR HFA) 115-21 MCG/ACT inhaler Inhale 2 puffs into the lungs 2 (two) times daily. 09/23/21   Kennith Gain, MD  ?gabapentin (NEURONTIN) 300 MG capsule Take 300 mg by mouth 3 (three) times daily.    [provider]  ?JANUMET XR 50-500 MG TB24 Take 1 tablet by mouth daily. 09/27/20   [provider]  ?levocetirizine (XYZAL) 5 MG tablet Take 1 tablet (5 mg total) by mouth every evening. 03/23/22   Scot Jun, FNP  ?meclizine (ANTIVERT) 25 MG tablet Take by mouth. 08/26/21   [provider]  ?methylPREDNISolone (MEDROL DOSEPAK) 4 MG TBPK tablet Take 24 mg on day 1, 20 mg on day 2, 16 mg on day 3, 12 mg on day 4, 8 mg on day 5, 4 mg on day 6. 04/11/22   Daneil Beem, MD  ?metoprolol tartrate (LOPRESSOR) 25 MG tablet Take 25 mg by mouth 2 (two) times daily.    [provider]  ?montelukast (SINGULAIR) 10 MG tablet Take 10 mg by mouth daily.     [provider]  ?Multiple Vitamin (MULTIVITAMIN WITH MINERALS) TABS tablet Take 1 tablet by mouth daily.    [provider]  ?nystatin (MYCOSTATIN) 100000 UNIT/ML suspension Take 5 mLs (500,000 Units total) by mouth 4 (four) times daily. 03/23/22   Scot Jun, FNP   ?nystatin cream (MYCOSTATIN) Apply 1 application. topically 2 (two) times daily. 03/18/21   [provider]  ?ondansetron (ZOFRAN) 4 MG tablet Take 4 mg by mouth every 8 (eight) hours as needed. 02/15/21   [provider]  ?pantoprazole (PROTONIX) 40  MG tablet Take 40 mg by mouth 2 (two) times daily.  11/20/19   [provider]  ?promethazine-dextromethorphan (PROMETHAZINE-DM) 6.25-15 MG/5ML syrup Take 2.5 mLs by mouth 4 (four) times daily as needed for cough. 12/04/21   Hazel Sams, PA-C  ?sucralfate (CARAFATE) 1 g tablet Take 1 g by mouth 4 (four) times daily.  02/11/20   [provider]  ?TRUE METRIX BLOOD GLUCOSE TEST test strip 1 each 3 (three) times daily. 09/20/21   [provider]  ?TRUEplus Lancets 33G MISC Apply 1 each topically 3 (three) times daily. 09/20/21   [provider]  ?esomeprazole (NEXIUM) 40 MG capsule Take 1 capsule (40 mg total) by mouth daily. ?Patient not taking: Reported on 07/08/2020 04/24/19 08/06/20  Charlesetta Shanks, MD  ? ? ?Allergies ?Crab [shellfish allergy], Erythromycin, Hm lidocaine patch [lidocaine], Erdafitinib, Fish allergy, Metoclopramide, Sulfacetamide, Sulfacetamide sodium-sulfur, Doxycycline, Levaquin [levofloxacin in d5w], Morphine, and Sulfa antibiotics ? ? ?REVIEW OF SYSTEMS  ?Negative except as noted here or in the History of Present Illness. ? ? ?PHYSICAL EXAMINATION  ?Initial Vital Signs ?Blood pressure (!) 161/93, pulse 82, temperature 98.2 ?F (36.8 ?C), temperature source Oral, resp. rate 18, height 5' 6"  (1.676 m), weight 93.9 kg, SpO2 100 %. ? ?Examination ?General: Well-developed, well-nourished female in no acute distress; appearance consistent with age of record ?HENT: normocephalic; atraumatic; edentulous; no pharyngeal edema; no stridor; no dysphonia ?Eyes: Normal appearance ?Neck: supple ?Heart: regular rate and rhythm ?Lungs: clear to auscultation bilaterally ?Abdomen: soft; nondistended; nontender; bowel  sounds present ?Extremities: No deformity; full range of motion ?Neurologic: Awake, alert and oriented; motor function intact in all extremities and symmetric; no facial droop ?Skin: Warm and dry ?Psychiat

## 2022-04-11 NOTE — ED Triage Notes (Addendum)
Patient arrived stating on Saturday her daughter was mowing the grass and opened the door causing patient to have a scratchy throat. States she is allergic to the grass and used her Advair inhaler with little relief.   ? ?

## 2022-04-12 ENCOUNTER — Ambulatory Visit (INDEPENDENT_AMBULATORY_CARE_PROVIDER_SITE_OTHER): Payer: Medicare HMO | Admitting: Podiatry

## 2022-04-12 ENCOUNTER — Encounter: Payer: Self-pay | Admitting: Podiatry

## 2022-04-12 DIAGNOSIS — B351 Tinea unguium: Secondary | ICD-10-CM

## 2022-04-12 DIAGNOSIS — E1142 Type 2 diabetes mellitus with diabetic polyneuropathy: Secondary | ICD-10-CM | POA: Diagnosis not present

## 2022-04-12 DIAGNOSIS — L84 Corns and callosities: Secondary | ICD-10-CM | POA: Diagnosis not present

## 2022-04-12 DIAGNOSIS — M79675 Pain in left toe(s): Secondary | ICD-10-CM

## 2022-04-12 DIAGNOSIS — M79674 Pain in right toe(s): Secondary | ICD-10-CM | POA: Diagnosis not present

## 2022-04-20 NOTE — Progress Notes (Signed)
?Subjective:  ?Patient ID: Pamela Roberson, female    DOB: 25-Jan-1956,  MRN: [id number] ? ?Pamela Roberson presents to clinic today for at risk foot care with history of diabetic neuropathy and callus(es) b/l lower extremities and painful thick toenails that are difficult to trim. Painful toenails interfere with ambulation. Aggravating factors include wearing enclosed shoe gear. Pain is relieved with periodic professional debridement. Painful calluses are aggravated when weightbearing with and without shoegear. Pain is relieved with periodic professional debridement. ? ?Last known HgA1c was in the 6% range. Patient did not check blood glucose today. ? ?New problem(s): None.  ? ?Patient states she is obtaining her shoes from an outside vendor who will deliver them to her at home. She states she has had problems with her allergies due to inhaling air from fresh cut grass. She had to be seen in the ED for this. ? ?PCP is Glendon Axe, MD , and last visit was December, 2022. ? ?Allergies  ?Allergen Reactions  ? Crab [Shellfish Allergy] Shortness Of Breath and Swelling  ? Erythromycin Swelling and Rash  ? Hm Lidocaine Patch [Lidocaine]   ?  Burning on skin  ? Erdafitinib   ?  Other reaction(s): pt not sure  ? Fish Allergy   ?  Swelling   ? Metoclopramide Other (See Comments)  ?  Slurred speech and unable to move muscles,  Caused her to drag her feet  ? Sulfacetamide   ?  Other reaction(s): itching, rash  ? Sulfacetamide Sodium-Sulfur Other (See Comments)  ?  Other reaction(s): itching, rash  ? Doxycycline   ?  GI Intolerance  ? Levaquin [Levofloxacin In D5w] Other (See Comments)  ?  Causes irregular heart beat  ? Morphine Nausea And Vomiting and Rash  ?  Irregular heart beat  ? Sulfa Antibiotics Rash  ? ? ?Review of Systems: Negative except as noted in the HPI. ? ?Objective: No changes noted in today's physical examination. ?There were no vitals filed for this visit. ? ?Pamela Roberson is a pleasant 66  y.o. female in NAD. AAO X 3. ? ?Vascular Examination: ?CFT <3 seconds b/l LE. Palpable DP pulse(s) b/l LE. Faintly palpable PT pulse(s) b/l LE. Pedal hair absent. No pain with calf compression b/l. Lower extremity skin temperature gradient within normal limits. No cyanosis or clubbing noted b/l LE. ? ?Dermatological Examination: ?Pedal integument with normal turgor, texture and tone b/l LE. No open wounds b/l. No interdigital macerations b/l. Toenails 1-5 b/l elongated, thickened, discolored with subungual debris. +Tenderness with dorsal palpation of nailplates. Hyperkeratotic lesion(s) noted submet head 5 b/l and sub 5th met base right lower extremity. ? ?Musculoskeletal Examination: ?Muscle strength 5/5 to all lower extremity muscle groups bilaterally. No pain, crepitus or joint limitation noted with ROM bilateral LE. 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 ? ?Neurological Examination: ?Pt has subjective symptoms of neuropathy. Protective sensation intact 5/5 intact bilaterally with 10g monofilament b/l. Vibratory sensation intact b/l. ? ?Assessment/Plan: ?1. Pain due to onychomycosis of toenails of both feet   ?2. Callus   ?3. Diabetic peripheral neuropathy associated with type 2 diabetes mellitus ([place])   ?  ?-Patient was evaluated and treated. All patient's and/or POA's questions/concerns answered on today's visit. ?-Patient to continue soft, supportive shoe gear daily. ?-Toenails 1-5 b/l were debrided in length and girth with sterile nail nippers and dremel without iatrogenic bleeding.  ?-Corn(s) submet head 5 b/l pared utilizing sterile scalpel blade without complication or  incident. Total number debrided=2. ?-Patient/POA to call should there be question/concern in the interim.  ? ?Return in about 3 months (around 07/13/2022). ? ?Marzetta Board, DPM  ?

## 2022-04-26 ENCOUNTER — Other Ambulatory Visit: Payer: Self-pay | Admitting: Family Medicine

## 2022-05-12 ENCOUNTER — Ambulatory Visit (INDEPENDENT_AMBULATORY_CARE_PROVIDER_SITE_OTHER): Payer: Medicare HMO | Admitting: Allergy

## 2022-05-12 VITALS — BP 130/84 | HR 84 | Temp 98.2°F | Resp 18 | Ht 66.0 in | Wt 213.2 lb

## 2022-05-12 DIAGNOSIS — T7800XD Anaphylactic reaction due to unspecified food, subsequent encounter: Secondary | ICD-10-CM

## 2022-05-12 DIAGNOSIS — H1013 Acute atopic conjunctivitis, bilateral: Secondary | ICD-10-CM | POA: Diagnosis not present

## 2022-05-12 DIAGNOSIS — K2100 Gastro-esophageal reflux disease with esophagitis, without bleeding: Secondary | ICD-10-CM

## 2022-05-12 DIAGNOSIS — T50905D Adverse effect of unspecified drugs, medicaments and biological substances, subsequent encounter: Secondary | ICD-10-CM

## 2022-05-12 DIAGNOSIS — J3089 Other allergic rhinitis: Secondary | ICD-10-CM

## 2022-05-12 DIAGNOSIS — J4540 Moderate persistent asthma, uncomplicated: Secondary | ICD-10-CM | POA: Diagnosis not present

## 2022-05-12 MED ORDER — EPINEPHRINE 0.3 MG/0.3ML IJ SOAJ: 0.3000 mg | INTRAMUSCULAR | 2 refills | Status: DC | PRN

## 2022-05-12 MED ORDER — FAMOTIDINE 20 MG PO TABS: 20.0000 mg | ORAL_TABLET | Freq: Two times a day (BID) | ORAL | 5 refills | Status: DC

## 2022-05-12 NOTE — Patient Instructions (Addendum)
-  continue avoidance measures for grass pollen and tobacco leaf -allergen avoidance measures discussed/handouts provided -continue Xyzal '5mg'$  daily.   -continue nasal atrovent 2 sprays each nostril twice a day for nasal congestion and drainage control -recommend obtaining labwork for environmental allergen to make sure you do not have other allergens besides the above in anticipation to starting allergen immunotherapy (allergy shots)  -continue Singulair '10mg'$  daily at bedtime -continue Advair ([place]ela) 1106mg/21 take 2 puffs twice a day.  -have access to albuterol inhaler 2 puffs or albuterol 1 vial via nebulizer every 4-6 hours as needed for cough/wheeze/shortness of breath/chest tightness.  May use 15-20 minutes prior to activity.   Monitor frequency of use.    -food allergy testing both skin and blood work is negative for fish and shellfish -recommend you schedule for food challenge to [facility] and then can schedule for shellfish challenge - have access to self-injectable epinephrine Epipen 0.'3mg'$  at all times - follow emergency action plan in case of allergic reaction  -continue lifestyle modifications for reflux control (like propping up head of bed and minimizing foods that cause heartburn) -continue pantoprazole twice a day as per your gastroenterologist -recommend adding Pepcid 20 mg 1-2 times a day to your pantoprazole to see if this will provide any more reflux control  -continue your current medication avoidance  Follow-up in 4-6 months or sooner if needed

## 2022-05-12 NOTE — Progress Notes (Signed)
Follow-up Note  RE: Pamela Roberson MRN: [id number] DOB: [birth year] Date of Office Visit: 05/12/2022   History of present illness: Pamela Roberson is a 66 y.o. female presenting today for follow-up of allergic rhinoconjunctivitis, asthma, food allergy and reflux.  She was last seen in the office on 09/23/2021 by myself.  She states her daughter had cut the grass earlier this month and when she woke up she felt like she couldn't breath and was having a asthma attack.  She also reports having a scratchy throat.  She took her inhalers including Advair and albuterol but symptoms did not resolve completely.  She went to ED for those symptoms.  This was on 04/11/2022.  In the ED she did have an elevated BP with a normal exam.  She was treated with Benadryl and Pepcid.  She was also prescribed a prednisone taper pack. This is the only flare of her asthma that she reports since her last visit.  She otherwise feels like her asthma has been doing okay.  She is still taking Advair HFA 2 puff twice a day as well as daily Singulair.  She states she still having issues with reflux and she is taking pantaprazole twice a day by her GI specialist.  She states her reflux is still bad however with sting twice a day dosing and she feels like it is interfering with her asthma control as she is getting choked up causing her to cough.  She drinks coffee and that is something she states flares up her reflux.  She has tried drinking decaf and this still clears her reflux.  She states she has a hiatal hernia but was told was too small to correct.   She states she has gotten use to taking Xyzal now and does feel it has been helpful with her overall allergy symptom control.  She does have nasal Atrovent to use for drainage and/or congestion.  However she is interested in allergy immunotherapy.  Her skin testing was only positive however to 1 grass pollen and tobacco abuse.  She continues to avoid fish and shellfish  in the diet.  She has access to an epinephrine device.  Review of systems in the past 4 weeks: Review of Systems  Constitutional: Negative.   HENT: Negative.    Eyes: Negative.   Respiratory: Negative.    Cardiovascular: Negative.   Gastrointestinal:        Reflux symptoms  Musculoskeletal: Negative.   Skin: Negative.   Allergic/Immunologic: Negative.   Neurological: Negative.      All other systems negative unless noted above in HPI  Past medical/social/surgical/family history have been reviewed and are unchanged unless specifically indicated below.  No changes  Medication List: Current Outpatient Medications  Medication Sig Dispense Refill   acyclovir (ZOVIRAX) 400 MG tablet Take 400 mg by mouth 2 (two) times daily.     albuterol (PROAIR HFA) 108 (90 Base) MCG/ACT inhaler Inhale 1-2 puffs into the lungs every 6 (six) hours as needed for wheezing or shortness of breath. 8 g 0   albuterol (PROVENTIL) (2.5 MG/3ML) 0.083% nebulizer solution Take 3 mLs (2.5 mg total) by nebulization every 4 (four) hours as needed for wheezing or shortness of breath. 30 vial 0   Alcohol Swabs (B-D SINGLE USE SWABS REGULAR) PADS Apply topically.     Artificial Tear Ointment (DRY EYES OP) Place 1-2 drops into both eyes daily as needed (for dry eyes).      ascorbic acid (VITAMIN C)  1000 MG tablet Take 1 tablet by mouth daily.     atorvastatin (LIPITOR) 10 MG tablet Take 1 tablet by mouth daily.     Blood Glucose Calibration (TRUE METRIX LEVEL 1) Low SOLN      Blood Glucose Monitoring Suppl (TRUE METRIX METER) w/Device KIT      cholecalciferol (VITAMIN D3) 25 MCG (1000 UNIT) tablet Take 2,000 Units by mouth daily.     cycloSPORINE (RESTASIS) 0.05 % ophthalmic emulsion Place 1 drop into both eyes 2 times daily.     dicyclomine (BENTYL) 20 MG tablet Take 20 mg by mouth 3 (three) times daily.     EPINEPHrine 0.3 mg/0.3 mL IJ SOAJ injection Inject 0.3 mg into the muscle as needed for anaphylaxis. 1 each 2    famotidine (PEPCID) 20 MG tablet Take 1 tablet (20 mg total) by mouth 2 (two) times daily. 60 tablet 5   fluticasone (FLONASE) 50 MCG/ACT nasal spray Place 1 spray into both nostrils daily as needed for allergies or rhinitis.     fluticasone-salmeterol (ADVAIR HFA) 115-21 MCG/ACT inhaler Inhale 2 puffs into the lungs 2 (two) times daily. 1 each 5   gabapentin (NEURONTIN) 300 MG capsule Take 300 mg by mouth 3 (three) times daily.     JANUMET XR 50-500 MG TB24 Take 1 tablet by mouth daily.     levocetirizine (XYZAL) 5 MG tablet Take 1 tablet (5 mg total) by mouth every evening. 30 tablet 0   meclizine (ANTIVERT) 25 MG tablet Take by mouth.     methylPREDNISolone (MEDROL DOSEPAK) 4 MG TBPK tablet Take 24 mg on day 1, 20 mg on day 2, 16 mg on day 3, 12 mg on day 4, 8 mg on day 5, 4 mg on day 6. 21 tablet 0   metoprolol succinate (TOPROL-XL) 25 MG 24 hr tablet Take by mouth.     montelukast (SINGULAIR) 10 MG tablet Take 10 mg by mouth daily.      Multiple Vitamin (MULTIVITAMIN WITH MINERALS) TABS tablet Take 1 tablet by mouth daily.     nystatin (MYCOSTATIN) 100000 UNIT/ML suspension Take 5 mLs (500,000 Units total) by mouth 4 (four) times daily. 60 mL 0   nystatin cream (MYCOSTATIN) Apply 1 application. topically 2 (two) times daily.     ondansetron (ZOFRAN) 4 MG tablet Take 4 mg by mouth every 8 (eight) hours as needed.     pantoprazole (PROTONIX) 40 MG tablet Take 40 mg by mouth 2 (two) times daily.      promethazine-dextromethorphan (PROMETHAZINE-DM) 6.25-15 MG/5ML syrup Take 2.5 mLs by mouth 4 (four) times daily as needed for cough. 50 mL 0   sucralfate (CARAFATE) 1 g tablet Take 1 g by mouth 4 (four) times daily.      TRUE METRIX BLOOD GLUCOSE TEST test strip 1 each 3 (three) times daily.     TRUEplus Lancets 33G MISC Apply 1 each topically 3 (three) times daily.     No current facility-administered medications for this visit.     Known medication allergies: Allergies  Allergen Reactions    Crab [Shellfish Allergy] Shortness Of Breath and Swelling   Erythromycin Swelling and Rash   Hm Lidocaine Patch [Lidocaine]     Burning on skin   Erdafitinib     Other reaction(s): pt not sure   Fish Allergy     Swelling    Metoclopramide Other (See Comments)    Slurred speech and unable to move muscles,  Caused her to drag her feet  Sulfacetamide     Other reaction(s): itching, rash   Sulfacetamide Sodium-Sulfur Other (See Comments)    Other reaction(s): itching, rash   Doxycycline     GI Intolerance   Levaquin [Levofloxacin In D5w] Other (See Comments)    Causes irregular heart beat   Morphine Nausea And Vomiting and Rash    Irregular heart beat   Sulfa Antibiotics Rash     Physical examination: Blood pressure 130/84, pulse 84, temperature 98.2 F (36.8 C), resp. rate 18, height _0  (1.676 m), weight 213 lb 4 oz (96.7 kg), SpO2 98 %.  General: Alert, interactive, in no acute distress. HEENT: PERRLA, TMs pearly gray, turbinates minimally edematous without discharge, post-pharynx non erythematous. Neck: Supple without lymphadenopathy. Lungs: Clear to auscultation without wheezing, rhonchi or rales. {no increased work of breathing. CV: Normal S1, S2 without murmurs. Abdomen: Nondistended, nontender. Skin: Warm and dry, without lesions or rashes. Extremities:  No clubbing, cyanosis or edema. Neuro:   Grossly intact.  Diagnositics/Labs: None today  Assessment and plan: Allergic rhinitis with conjunctivitis -continue avoidance measures for grass pollen and tobacco leaf -allergen avoidance measures discussed/handouts provided -continue Xyzal 15m daily.   -continue nasal atrovent 2 sprays each nostril twice a day for nasal congestion and drainage control -Ordered labwork for environmental allergen to make sure you do not have other allergens besides the above in anticipation to starting allergen immunotherapy (allergy shots)  Moderate persistent asthma -continue  Singulair 172mdaily at bedtime -continue Advair (Wixela) 11595m21 take 2 puffs twice a day.  -have access to albuterol inhaler 2 puffs or albuterol 1 vial via nebulizer every 4-6 hours as needed for cough/wheeze/shortness of breath/chest tightness.  May use 15-20 minutes prior to activity.   Monitor frequency of use.    Anaphylaxis due to food -food allergy testing both skin and blood work is negative for fish and shellfish -recommend you schedule for food challenge to [facility] and then can schedule for shellfish challenge - have access to self-injectable epinephrine Epipen 0.3mg53m all times - follow emergency action plan in case of allergic reaction  GERD -continue lifestyle modifications for reflux control (like propping up head of bed and minimizing foods that cause heartburn) -continue pantoprazole twice a day as per your gastroenterologist -recommend adding Pepcid 20 mg 1-2 times a day to your pantoprazole to see if this will provide any more reflux control  -continue your current medication avoidance  Follow-up in 4-6 months or sooner if needed  I appreciate the opportunity to take part in Pamela Roberson's care. Please do not hesitate to contact me with questions.  Sincerely,   ShayPrudy Feeler [facility]

## 2022-05-13 ENCOUNTER — Encounter: Payer: Self-pay | Admitting: Allergy

## 2022-05-29 LAB — ALLERGENS W/TOTAL IGE AREA 2
Alternaria Alternata IgE: <0.1 kU/L
Aspergillus Fumigatus IgE: <0.1 kU/L
Bermuda Grass IgE: <0.1 kU/L
Cat Dander IgE: <0.1 kU/L
Cedar, Mountain IgE: <0.1 kU/L
Cladosporium Herbarum IgE: <0.1 kU/L
Cockroach, German IgE: <0.1 kU/L
Common Silver Birch IgE: <0.1 kU/L
Cottonwood IgE: <0.1 kU/L
D Farinae IgE: <0.1 kU/L
D Pteronyssinus IgE: <0.1 kU/L
Dog Dander IgE: <0.1 kU/L
Elm, American IgE: <0.1 kU/L
IgE (Immunoglobulin E), Serum: 3 IU/mL — ABNORMAL LOW (ref 6–495)
Johnson Grass IgE: <0.1 kU/L
Maple/Box Elder IgE: <0.1 kU/L
Mouse Urine IgE: <0.1 kU/L
Oak, White IgE: <0.1 kU/L
Pecan, Hickory IgE: <0.1 kU/L
Penicillium Chrysogen IgE: <0.1 kU/L
Pigweed, Rough IgE: <0.1 kU/L
Ragweed, Short IgE: <0.1 kU/L
Sheep Sorrel IgE Qn: <0.1 kU/L
Timothy Grass IgE: <0.1 kU/L
White Mulberry IgE: <0.1 kU/L

## 2022-06-10 ENCOUNTER — Other Ambulatory Visit: Payer: Self-pay

## 2022-06-10 MED ORDER — ADVAIR HFA 115-21 MCG/ACT IN AERO: 2.0000 | INHALATION_SPRAY | Freq: Two times a day (BID) | RESPIRATORY_TRACT | 5 refills | Status: DC

## 2022-06-15 ENCOUNTER — Other Ambulatory Visit: Payer: Self-pay | Admitting: Neurosurgery

## 2022-06-15 DIAGNOSIS — M5416 Radiculopathy, lumbar region: Secondary | ICD-10-CM

## 2022-06-26 ENCOUNTER — Ambulatory Visit
Admission: RE | Admit: 2022-06-26 | Discharge: 2022-06-26 | Disposition: A | Discharge status: Home / Self Care | Payer: Medicare HMO | Source: Ambulatory Visit | Attending: Neurosurgery | Admitting: Neurosurgery

## 2022-06-26 DIAGNOSIS — M5416 Radiculopathy, lumbar region: Secondary | ICD-10-CM

## 2022-07-02 ENCOUNTER — Encounter (HOSPITAL_COMMUNITY): Payer: Self-pay | Admitting: Emergency Medicine

## 2022-07-02 ENCOUNTER — Ambulatory Visit (HOSPITAL_COMMUNITY)
Admission: EM | Admit: 2022-07-02 | Discharge: 2022-07-02 | Disposition: A | Discharge status: Home / Self Care | Payer: Medicare HMO | Attending: Physician Assistant | Admitting: Physician Assistant

## 2022-07-02 DIAGNOSIS — R519 Headache, unspecified: Secondary | ICD-10-CM

## 2022-07-02 DIAGNOSIS — J0140 Acute pansinusitis, unspecified: Secondary | ICD-10-CM

## 2022-07-02 MED ORDER — CEFDINIR 300 MG PO CAPS: 300.0000 mg | ORAL_CAPSULE | Freq: Two times a day (BID) | ORAL | 0 refills | Status: DC

## 2022-07-02 NOTE — ED Triage Notes (Signed)
Patient c/o headache, nasal congestion, sharp pain in right ear x 6 days.  Patient has been taken Tylenol for the pain.

## 2022-07-02 NOTE — ED Provider Notes (Signed)
[facility]    CSN: [id number] Arrival date & time: 07/02/22  1133      History   Chief Complaint Chief Complaint  Patient presents with   Headache    HPI Pamela Roberson is a 66 y.o. female.   Patient presents today with a weeklong history of URI symptoms including nasal congestion, headache, otalgia, cough.  She denies any chest pain, shortness of breath, vomiting.  Does report occasional nausea related to drainage.  She does have a history of asthma and has been taking maintenance medication including Advair as well as albuterol for rescue as needed.  She denies increasing use of albuterol recently since symptoms began.  Denies any known sick contacts but did recently travel and so was exposed to many people.  She does report that she has had steroids recently which caused significant hyperglycemia that was difficult to manage.  She is requesting that we defer steroids for the time being.  Denies any recent antibiotic use.  She reports headache with 8 on a 0-10 pain scale, described as pressure in her frontal region, described as pressure.  This is not the worst headache of her life.    Past Medical History:  Diagnosis Date   Arthritis    Asthma    Carotid stenosis, bilateral    Carpal tunnel syndrome    Chronic low back pain    COPD (chronic obstructive pulmonary disease) (HCC)    Diabetes mellitus without complication (HCC)    type II    Diabetic neuropathy (HCC)    Diverticulitis    Dyspnea    mild    Frequent falls    GERD (gastroesophageal reflux disease)    History of bronchitis    Hypercholesterolemia    Hypertension    IBS (irritable bowel syndrome)    Osteoarthritis    knee   Palpitations    Right ventricular enlargement    per pt report   Sleep apnea    mild but no sleep apnea    TMJ (dislocation of temporomandibular joint)    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    Vitamin D deficiency     Patient Active Problem List   Diagnosis  Date Noted   Pain in left knee 02/15/2022   Sciatica, left side 02/15/2022   Pain in left shoulder 11/16/2021   Internal hemorrhoids 11/03/2021   Leg cramps 11/03/2021   Myalgia 11/03/2021   Adenomatous polyp of colon 12/04/2020   Anal fissure 12/04/2020   Chronic idiopathic constipation 12/04/2020   Constipation 12/04/2020   Dysphagia 12/04/2020   Gastroesophageal reflux disease 12/04/2020   Rectal pain 12/04/2020   Right upper quadrant pain 12/04/2020   Dizzy 12/11/2017   Pharyngeal dysphagia 12/11/2017   Post-nasal drip 12/11/2017   Referred ear pain, bilateral 12/11/2017   Diabetes mellitus without complication (HCC) 06/29/2017   Family history of glaucoma 06/29/2017   Hyperopia of both eyes with astigmatism and presbyopia 06/29/2017   Keratoconjunctivitis sicca of both eyes not specified as Sjogren's 06/29/2017   Nuclear sclerotic cataract of both eyes 06/29/2017   Vitreous floater, bilateral 06/29/2017   Biceps tendinitis of right shoulder 04/05/2017   Impingement syndrome of right shoulder 04/05/2017   Arthritis of right acromioclavicular joint 04/05/2017   Partial nontraumatic tear of rotator cuff, right 04/05/2017    Past Surgical History:  Procedure Laterality Date   ABDOMINAL HYSTERECTOMY  1993   CARPAL TUNNEL RELEASE Right    CERVICAL DISC SURGERY  04/29/2015   Dr Dwan Sis, [place]   ESOPHAGEAL MANOMETRY N/A 03/03/2021   Procedure: ESOPHAGEAL MANOMETRY (EM);  Surgeon: Wilford Corner, MD;  Location: [facility];  Service: Endoscopy;  Laterality: N/A;   ESOPHAGOGASTRODUODENOSCOPY ENDOSCOPY     with esophagus being stretched twice per pt,    EYE SURGERY     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      Home Medications    Prior to Admission  medications   Medication Sig Start Date End Date Taking? Authorizing Provider  acyclovir (ZOVIRAX) 400 MG tablet Take 400 mg by mouth 2 (two) times daily.   Yes [provider]  ADVAIR HFA 115-21 MCG/ACT inhaler Inhale 2 puffs into the lungs 2 (two) times daily. 06/10/22  Yes Padgett, Rae Halsted, MD  albuterol [facility] HFA) 108 (90 Base) MCG/ACT inhaler Inhale 1-2 puffs into the lungs every 6 (six) hours as needed for wheezing or shortness of breath. 06/14/20  Yes Burky, Lanelle Bal B, NP  albuterol (PROVENTIL) (2.5 MG/3ML) 0.083% nebulizer solution Take 3 mLs (2.5 mg total) by nebulization every 4 (four) hours as needed for wheezing or shortness of breath. 03/11/19  Yes Raylene Everts, MD  Alcohol Swabs (B-D SINGLE USE SWABS REGULAR) PADS Apply topically. 05/16/21  Yes [provider]  Artificial Tear Ointment (DRY EYES OP) Place 1-2 drops into both eyes daily as needed (for dry eyes).    Yes [provider]  ascorbic acid (VITAMIN C) 1000 MG tablet Take 1 tablet by mouth daily.   Yes [provider]  atorvastatin (LIPITOR) 10 MG tablet Take 1 tablet by mouth daily.   Yes [provider]  Blood Glucose Calibration (TRUE METRIX LEVEL 1) Low SOLN  09/20/21  Yes [provider]  Blood Glucose Monitoring Suppl (TRUE METRIX METER) w/Device KIT  09/20/21  Yes [provider]  cefdinir (OMNICEF) 300 MG capsule Take 1 capsule (300 mg total) by mouth 2 (two) times daily. 07/02/22  Yes Roniyah Llorens, Derry Skill, PA-C  cholecalciferol (VITAMIN D3) 25 MCG (1000 UNIT) tablet Take 2,000 Units by mouth daily.   Yes [provider]  cycloSPORINE (RESTASIS) 0.05 % ophthalmic emulsion Place 1 drop into both eyes 2 times daily. 03/08/22  Yes [provider]  dicyclomine (BENTYL) 20 MG tablet Take 20 mg by mouth 3 (three) times daily. 10/17/19  Yes [provider]  EPINEPHrine 0.3 mg/0.3 mL IJ SOAJ injection Inject 0.3 mg into the muscle as  needed for anaphylaxis. 05/12/22  Yes Padgett, Rae Halsted, MD  famotidine (PEPCID) 20 MG tablet Take 1 tablet (20 mg total) by mouth 2 (two) times daily. 05/12/22  Yes Padgett, Rae Halsted, MD  fluticasone [facility]) 50 MCG/ACT nasal spray Place 1 spray into both nostrils daily as needed for allergies or rhinitis.   Yes [provider]  gabapentin (NEURONTIN) 300 MG capsule Take 300 mg by mouth 3 (three) times daily.   Yes [provider]  JANUMET XR 50-500 MG TB24 Take 1 tablet by mouth daily. 09/27/20  Yes [provider]  levocetirizine (XYZAL) 5 MG tablet Take 1 tablet (5 mg total) by mouth every evening. 03/23/22  Yes Scot Jun, FNP  meclizine (ANTIVERT) 25 MG tablet Take by mouth. 08/26/21  Yes [provider]  methylPREDNISolone (MEDROL DOSEPAK) 4 MG TBPK tablet Take 24 mg on day 1, 20 mg on day 2, 16 mg on day 3, 12 mg on day 4, 8 mg on day 5, 4 mg on day 6. 04/11/22  Yes Molpus, John, MD  metoprolol succinate (TOPROL-XL) 25 MG 24 hr tablet Take by mouth.   Yes [provider]  montelukast (SINGULAIR) 10 MG tablet Take 10 mg by mouth daily.    Yes [provider]  Multiple Vitamin (MULTIVITAMIN WITH MINERALS) TABS tablet Take 1 tablet by mouth daily.   Yes [provider]  nystatin (MYCOSTATIN) 100000 UNIT/ML suspension Take 5 mLs (500,000 Units total) by mouth 4 (four) times daily. 03/23/22  Yes Scot Jun, FNP  nystatin cream (MYCOSTATIN) Apply 1 application. topically 2 (two) times daily. 03/18/21  Yes [provider]  ondansetron (ZOFRAN) 4 MG tablet Take 4 mg by mouth every 8 (eight) hours as needed. 02/15/21  Yes [provider]  pantoprazole (PROTONIX) 40 MG tablet Take 40 mg by mouth 2 (two) times daily.  11/20/19  Yes [provider]  promethazine-dextromethorphan (PROMETHAZINE-DM) 6.25-15 MG/5ML syrup Take 2.5 mLs by mouth 4 (four) times daily as needed for cough. 12/04/21  Yes  Hazel Sams, PA-C  sucralfate (CARAFATE) 1 g tablet Take 1 g by mouth 4 (four) times daily.  02/11/20  Yes [provider]  TRUE METRIX BLOOD GLUCOSE TEST test strip 1 each 3 (three) times daily. 09/20/21  Yes [provider]  TRUEplus Lancets 33G MISC Apply 1 each topically 3 (three) times daily. 09/20/21  Yes [provider]  esomeprazole (NEXIUM) 40 MG capsule Take 1 capsule (40 mg total) by mouth daily. Patient not taking: Reported on 07/08/2020 04/24/19 08/06/20  Charlesetta Shanks, MD    Family History Family History  Problem Relation Age of Onset   Allergic rhinitis Mother    Diabetes Father        type 2   Heart disease Father    Hypertension Father    Allergic rhinitis Father    Allergic rhinitis Sister    Collagen disease Sister    Heart disease Brother    Hypertension Brother    Angioedema Neg Hx    Asthma Neg Hx    Atopy Neg Hx    Eczema Neg Hx    Immunodeficiency Neg Hx    Urticaria Neg Hx     Social History Social History   Tobacco Use   Smoking status: Former    Packs/day: 0.50    Years: 30.00    Total pack years: 15.00    Types: Cigarettes    Quit date: 12/08/1994    Years since quitting: 27.5   Smokeless tobacco: Never  Vaping Use   Vaping Use: Never used  Substance Use Topics   Alcohol use: No   Drug use: No     Allergies   Crab [shellfish allergy], Erythromycin, Hm lidocaine patch [lidocaine], Erdafitinib, Fish allergy, Metoclopramide, Sulfacetamide, Sulfacetamide sodium-sulfur, Doxycycline, Levaquin [levofloxacin in d5w], Morphine, and Sulfa antibiotics   Review of Systems Review of Systems  Constitutional:  Positive for activity change and fatigue. Negative for appetite change and fever.  HENT:  Positive for congestion, postnasal drip, sinus pressure and sore throat. Negative for sneezing.   Respiratory:  Positive for cough. Negative for shortness of breath.   Cardiovascular:  Negative for chest pain.   Gastrointestinal:  Positive for nausea. Negative for abdominal pain, diarrhea and vomiting.  Neurological:  Positive for dizziness and headaches. Negative for light-headedness.     Physical Exam Triage Vital Signs ED Triage Vitals  Enc Vitals Group     BP 07/02/22 1150 132/76     Pulse Rate 07/02/22 1150 64     Resp 07/02/22 1150 18     Temp 07/02/22 1150 98.3 F (36.8 C)     Temp Source 07/02/22 1150 Oral     SpO2 07/02/22 1150 100 %     Weight 07/02/22 1152 210 lb (95.3 kg)     Height 07/02/22 1152 _0  (1.676 m)     Head Circumference --      Peak Flow --      Pain Score 07/02/22 1152 8     Pain Loc --      Pain Edu? --      Excl. in [place]? --    No data found.  Updated Vital Signs BP 132/76 (BP Location: Left Arm)   Pulse 64   Temp 98.3 F (36.8 C) (Oral)   Resp 18   Ht _1  (1.676 m)   Wt 210 lb (95.3 kg)   SpO2 100%   BMI 33.89 kg/m   Visual Acuity Right Eye Distance:   Left Eye Distance:   Bilateral Distance:    Right Eye Near:   Left Eye Near:    Bilateral Near:     Physical Exam Vitals reviewed.  Constitutional:      General: She is awake. She is not in acute distress.    Appearance: Normal appearance. She is well-developed. She is not ill-appearing.     Comments: Very pleasant female appears stated age in no acute distress  HENT:     Head: Normocephalic and atraumatic.     Right Ear: Ear canal and external ear normal. A middle ear effusion is present. Tympanic membrane is not erythematous or bulging.     Left Ear: Ear canal and external ear normal. A middle ear effusion is present. Tympanic membrane is not erythematous or bulging.     Nose:     Right Sinus: Maxillary sinus tenderness present. No frontal sinus tenderness.     Left Sinus: Maxillary sinus tenderness present. No frontal sinus tenderness.     Mouth/Throat:     Pharynx: Uvula midline. Posterior oropharyngeal erythema present. No oropharyngeal exudate.  Cardiovascular:     Rate and  Rhythm: Normal rate and regular rhythm.     Heart sounds: Normal heart sounds, S1 normal and S2 normal. No murmur heard. Pulmonary:     Effort: Pulmonary effort is normal.     Breath sounds: Normal breath sounds. No wheezing, rhonchi or rales.     Comments: Clear to auscultation bilaterally Psychiatric:        Behavior: Behavior is cooperative.      UC Treatments / Results  Labs (all labs ordered are listed, but only abnormal results are displayed) Labs Reviewed - No data to display  EKG   Radiology No results found.  Procedures Procedures (including critical care time)  Medications Ordered in UC Medications - No data to display  Initial Impression / Assessment and Plan / UC Course  I have reviewed the triage vital signs and the nursing notes.  Pertinent labs & imaging results that were available during my care of the patient were reviewed by me and considered in my medical decision making (see chart for details).  No indication for viral testing as patient has been symptomatic for 1 week.  Given prolonged and worsening symptoms we will cover for secondary infection.  Patient started on Omnicef 300 mg twice daily for 10 days.  Discussed potential utility of steroids particular given history of asthma and COPD, however, given previous reactions we will defer this for the time being.  She was encouraged to use over-the-counter medications including Flonase and Mucinex.  She is to continue her maintenance inhaler regimen as previously prescribed.  Recommended she rest and drink plenty of fluids.  If her symptoms are not improving or if anything worsens she needs to return for reevaluation.  Recommended follow-up with her primary care provider next week.  If she develops any chest pain, shortness of breath, worsening cough, severe headache, nausea, vomiting she needs to be seen immediately to which she expressed understanding.  Final Clinical Impressions(s) / UC Diagnoses    Final diagnoses:  Acute non-recurrent pansinusitis  Sinus headache     Discharge Instructions      Start cefdinir twice daily for 10 days.  Continue your inhaler regimen as prescribed.  Use Mucinex and Flonase for additional symptom relief.  Make sure you rest and drink plenty fluid.  Follow-up with your primary care next week.  If you have any worsening symptoms you need to be seen immediately.     ED Prescriptions     Medication Sig Dispense Auth. Provider   cefdinir (OMNICEF) 300 MG capsule Take 1 capsule (300 mg total) by mouth 2 (two) times daily. 20 capsule Jilliann Subramanian, Derry Skill, PA-C      PDMP not reviewed this encounter.   Terrilee Croak, PA-C 07/02/22 1213

## 2022-07-02 NOTE — Discharge Instructions (Signed)
Start cefdinir twice daily for 10 days.  Continue your inhaler regimen as prescribed.  Use Mucinex and Flonase for additional symptom relief.  Make sure you rest and drink plenty fluid.  Follow-up with your primary care next week.  If you have any worsening symptoms you need to be seen immediately.

## 2022-07-19 ENCOUNTER — Ambulatory Visit (INDEPENDENT_AMBULATORY_CARE_PROVIDER_SITE_OTHER): Payer: Medicare HMO | Admitting: Podiatry

## 2022-07-19 DIAGNOSIS — E1142 Type 2 diabetes mellitus with diabetic polyneuropathy: Secondary | ICD-10-CM

## 2022-07-19 DIAGNOSIS — B351 Tinea unguium: Secondary | ICD-10-CM

## 2022-07-19 DIAGNOSIS — L84 Corns and callosities: Secondary | ICD-10-CM

## 2022-07-19 DIAGNOSIS — M79674 Pain in right toe(s): Secondary | ICD-10-CM

## 2022-07-19 DIAGNOSIS — M79675 Pain in left toe(s): Secondary | ICD-10-CM | POA: Diagnosis not present

## 2022-07-24 ENCOUNTER — Encounter: Payer: Self-pay | Admitting: Podiatry

## 2022-07-24 NOTE — Progress Notes (Signed)
  Subjective:  Patient ID: Pamela Roberson, female    DOB: 16-Dec-1955,  MRN: [id number]  66 y.o. female presents at risk foot care with history of diabetic neuropathy and callus(es) b/l lower extremities and painful thick toenails that are difficult to trim. Painful toenails interfere with ambulation. Aggravating factors include wearing enclosed shoe gear. Pain is relieved with periodic professional debridement. Painful calluses are aggravated when weightbearing with and without shoegear. Pain is relieved with periodic professional debridement.  Last A1c was 7%.  New problem(s): None   PCP is Glendon Axe, MD , and last visit was February, 2023.  Allergies  Allergen Reactions   Crab [Shellfish Allergy] Shortness Of Breath and Swelling   Erythromycin Swelling and Rash   Hm Lidocaine Patch [Lidocaine]     Burning on skin   Erdafitinib     Other reaction(s): pt not sure   Fish Allergy     Swelling    Metoclopramide Other (See Comments)    Slurred speech and unable to move muscles,  Caused her to drag her feet   Sulfacetamide     Other reaction(s): itching, rash   Sulfacetamide Sodium-Sulfur Other (See Comments)    Other reaction(s): itching, rash   Doxycycline     GI Intolerance   Levaquin [Levofloxacin In D5w] Other (See Comments)    Causes irregular heart beat   Morphine Nausea And Vomiting and Rash    Irregular heart beat   Sulfa Antibiotics Rash    Review of Systems: Negative except as noted in the HPI.   Objective:  Vascular Examination: Vascular status intact b/l with palpable DP pulses b/l; nonpalpable PT pulses b/l. CFT immediate <3 seconds b/l. No edema. No pain with calf compression b/l. Skin temperature gradient WNL b/l. No cyanosis or clubbing noted b/l LE.  Neurological Examination: Pt has subjective symptoms of neuropathy. Protective sensation intact 5/5 intact bilaterally with 10g monofilament b/l. Vibratory sensation intact b/l.  Dermatological  Examination: Pedal skin with normal turgor, texture and tone b/l. Toenails 1-5 b/l thick, discolored, elongated with subungual debris and pain on dorsal palpation. Hyperkeratotic lesion(s) submet head 5 b/l and sub 5th met base right lower extremity.  No erythema, no edema, no drainage, no fluctuance.  Musculoskeletal Examination: Muscle strength 5/5 to b/l LE. 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  Radiographs: None   Assessment:   1. Pain due to onychomycosis of toenails of both feet   2. Callus   3. Diabetic peripheral neuropathy associated with type 2 diabetes mellitus ([place])    Plan:  -Examined patient. -No new findings. No new orders. -Medicaid ABN signed for services of paring of corn(s)/callus(es)/porokeratos(es) today. Copy in patient chart. -Mycotic toenails 1-5 bilaterally were debrided in length and girth with sterile nail nippers and dremel without incident. -Callus(es) submet head 5 b/l and sub 5th met base sub 5th met base right lower extremity pared utilizing sterile scalpel blade without complication or incident. Total number debrided =3. -Patient/POA to call should there be question/concern in the interim.  Return in about 3 months (around 10/19/2022).  Marzetta Board, DPM

## 2022-08-09 ENCOUNTER — Ambulatory Visit (INDEPENDENT_AMBULATORY_CARE_PROVIDER_SITE_OTHER): Payer: Medicare HMO

## 2022-08-09 ENCOUNTER — Ambulatory Visit
Admission: EM | Admit: 2022-08-09 | Discharge: 2022-08-09 | Disposition: A | Discharge status: Home / Self Care | Payer: Medicare HMO | Attending: Internal Medicine | Admitting: Internal Medicine

## 2022-08-09 DIAGNOSIS — R053 Chronic cough: Secondary | ICD-10-CM | POA: Diagnosis not present

## 2022-08-09 DIAGNOSIS — J069 Acute upper respiratory infection, unspecified: Secondary | ICD-10-CM | POA: Diagnosis not present

## 2022-08-09 DIAGNOSIS — R059 Cough, unspecified: Secondary | ICD-10-CM

## 2022-08-09 MED ORDER — BENZONATATE 100 MG PO CAPS: 100.0000 mg | ORAL_CAPSULE | Freq: Three times a day (TID) | ORAL | 0 refills | Status: DC | PRN

## 2022-08-09 MED ORDER — AMOXICILLIN-POT CLAVULANATE 875-125 MG PO TABS: 1.0000 | ORAL_TABLET | Freq: Two times a day (BID) | ORAL | 0 refills | Status: DC

## 2022-08-09 NOTE — Discharge Instructions (Signed)
You have been prescribed antibiotic and a cough medication.  Please follow-up if symptoms persist or worsen.

## 2022-08-09 NOTE — ED Triage Notes (Signed)
Pt. States she was treated at an urgent care weeks ago with an antibiotic for her cough, nasal congestion, headache and ear fullness. States she finished her medication regimen and feels worse.

## 2022-08-09 NOTE — ED Provider Notes (Signed)
[facility]    CSN: [id number] Arrival date & time: 08/09/22  5462      History   Chief Complaint Chief Complaint  Patient presents with   Headache   Nasal Congestion   Cough   Ear Fullness    HPI Pamela Roberson is a 66 y.o. female.   Patient presents with persistent cough, nasal congestion, headache, bilateral ear discomfort that has been present for multiple weeks.  Patient was seen on 07/02/2022 for same symptoms.  Was prescribed cefdinir with no improvement.  Denies chest pain, shortness of breath, nausea, vomiting, diarrhea, abdominal pain.  Patient reports history of asthma but denies history of COPD and is not a smoker.  Although, COPD is reported in patient's chart.   Headache Cough Ear Fullness    Past Medical History:  Diagnosis Date   Arthritis    Asthma    Carotid stenosis, bilateral    Carpal tunnel syndrome    Chronic low back pain    COPD (chronic obstructive pulmonary disease) (HCC)    Diabetes mellitus without complication ([place])    type II    Diabetic neuropathy (HCC)    Diverticulitis    Dyspnea    mild    Frequent falls    GERD (gastroesophageal reflux disease)    History of bronchitis    Hypercholesterolemia    Hypertension    IBS (irritable bowel syndrome)    Osteoarthritis    knee   Palpitations    Right ventricular enlargement    per pt report   Sleep apnea    mild but no sleep apnea    TMJ (dislocation of temporomandibular joint)    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    Vitamin D deficiency     Patient Active Problem List   Diagnosis Date Noted   Pain in left knee 02/15/2022   Sciatica, left side 02/15/2022   Pain in left shoulder 11/16/2021   Internal hemorrhoids 11/03/2021   Leg cramps 11/03/2021   Myalgia 11/03/2021   Adenomatous polyp of colon 12/04/2020   Anal fissure 12/04/2020   Chronic idiopathic constipation 12/04/2020   Constipation 12/04/2020   Dysphagia 12/04/2020   Gastroesophageal  reflux disease 12/04/2020   Rectal pain 12/04/2020   Right upper quadrant pain 12/04/2020   Dizzy 12/11/2017   Pharyngeal dysphagia 12/11/2017   Post-nasal drip 12/11/2017   Referred ear pain, bilateral 12/11/2017   Diabetes mellitus without complication ([place]) 70/35/0093   Family history of glaucoma 06/29/2017   Hyperopia of both eyes with astigmatism and presbyopia 06/29/2017   Keratoconjunctivitis sicca of both eyes not specified as Sjogren's 06/29/2017   Nuclear sclerotic cataract of both eyes 06/29/2017   Vitreous floater, bilateral 06/29/2017   Biceps tendinitis of right shoulder 04/05/2017   Impingement syndrome of right shoulder 04/05/2017   Arthritis of right acromioclavicular joint 04/05/2017   Partial nontraumatic tear of rotator cuff, right 04/05/2017    Past Surgical History:  Procedure Laterality Date   ABDOMINAL HYSTERECTOMY  1993   CARPAL TUNNEL RELEASE Right    CERVICAL [place] SURGERY  04/29/2015   Dr Yazmen Sis, [place] N/A 03/03/2021   Procedure: ESOPHAGEAL MANOMETRY (EM);  Surgeon: Wilford Corner, MD;  Location: [facility];  Service: Endoscopy;  Laterality: N/A;   ESOPHAGOGASTRODUODENOSCOPY ENDOSCOPY     with esophagus being stretched twice per pt,    EYE SURGERY     fatty 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  feeding tube placement and removal   2015   NASAL SEPTUM SURGERY     ROTATOR CUFF REPAIR Right    spurs removed from esophagus   2015   TONSILLECTOMY      OB History   No obstetric history on file.      Home Medications    Prior to Admission medications   Medication Sig Start Date End Date Taking? Authorizing Provider  amoxicillin-clavulanate (AUGMENTIN) 875-125 MG tablet Take 1 tablet by mouth every 12 (twelve) hours. 08/09/22  Yes Chardae Mulkern, Hildred Alamin E, FNP  benzonatate (TESSALON) 100 MG capsule Take 1 capsule (100 mg total) by mouth every 8 (eight) hours as needed for cough. 08/09/22  Yes Nitin Mckowen, Michele Rockers, FNP  acyclovir  (ZOVIRAX) 400 MG tablet Take 400 mg by mouth 2 (two) times daily.    [provider]  ADVAIR [facility] 854-62 MCG/ACT inhaler Inhale 2 puffs into the lungs 2 (two) times daily. 06/10/22   Kennith Gain, MD  albuterol (PROAIR HFA) 108 (90 Base) MCG/ACT inhaler Inhale 1-2 puffs into the lungs every 6 (six) hours as needed for wheezing or shortness of breath. 06/14/20   Zigmund Gottron, NP  albuterol (PROVENTIL) (2.5 MG/3ML) 0.083% nebulizer solution Take 3 mLs (2.5 mg total) by nebulization every 4 (four) hours as needed for wheezing or shortness of breath. 03/11/19   Raylene Everts, MD  Alcohol Swabs (B-D SINGLE USE SWABS REGULAR) PADS Apply topically. 05/16/21   [provider]  Artificial Tear Ointment (DRY EYES OP) Place 1-2 drops into both eyes daily as needed (for dry eyes).     [provider]  ascorbic acid (VITAMIN C) 1000 MG tablet Take 1 tablet by mouth daily.    [provider]  atorvastatin (LIPITOR) 10 MG tablet Take 1 tablet by mouth daily.    [provider]  Blood Glucose Calibration (TRUE METRIX LEVEL 1) Low SOLN  09/20/21   [provider]  Blood Glucose Monitoring Suppl (TRUE METRIX METER) w/Device KIT  09/20/21   [provider]  cholecalciferol (VITAMIN D3) 25 MCG (1000 UNIT) tablet Take 2,000 Units by mouth daily.    [provider]  cycloSPORINE (RESTASIS) 0.05 % ophthalmic emulsion Place 1 drop into both eyes 2 times daily. 03/08/22   [provider]  dicyclomine (BENTYL) 20 MG tablet Take 20 mg by mouth 3 (three) times daily. 10/17/19   [provider]  EPINEPHrine 0.3 mg/0.3 mL IJ SOAJ injection Inject 0.3 mg into the muscle as needed for anaphylaxis. 05/12/22   Kennith Gain, MD  famotidine (PEPCID) 20 MG tablet Take 1 tablet (20 mg total) by mouth 2 (two) times daily. 05/12/22   Kennith Gain, MD  fluticasone (FLONASE) 50 MCG/ACT nasal spray Place 1 spray into  both nostrils daily as needed for allergies or rhinitis.    [provider]  gabapentin (NEURONTIN) 300 MG capsule Take 300 mg by mouth 3 (three) times daily.    [provider]  JANUMET XR 50-500 MG TB24 Take 1 tablet by mouth daily. 09/27/20   [provider]  levocetirizine (XYZAL) 5 MG tablet Take 1 tablet (5 mg total) by mouth every evening. 03/23/22   Scot Jun, FNP  meclizine (ANTIVERT) 25 MG tablet Take by mouth. 08/26/21   [provider]  methylPREDNISolone (MEDROL DOSEPAK) 4 MG TBPK tablet Take 24 mg on day 1, 20 mg on day 2, 16 mg on day 3, 12 mg on day 4,  8 mg on day 5, 4 mg on day 6. 04/11/22   Molpus, John, MD  metoprolol succinate (TOPROL-XL) 25 MG 24 hr tablet Take by mouth.    [provider]  montelukast (SINGULAIR) 10 MG tablet Take 10 mg by mouth daily.     [provider]  Multiple Vitamin (MULTIVITAMIN WITH MINERALS) TABS tablet Take 1 tablet by mouth daily.    [provider]  nystatin (MYCOSTATIN) 100000 UNIT/ML suspension Take 5 mLs (500,000 Units total) by mouth 4 (four) times daily. 03/23/22   Scot Jun, FNP  nystatin cream (MYCOSTATIN) Apply 1 application. topically 2 (two) times daily. 03/18/21   [provider]  ondansetron (ZOFRAN) 4 MG tablet Take 4 mg by mouth every 8 (eight) hours as needed. 02/15/21   [provider]  pantoprazole (PROTONIX) 40 MG tablet Take 40 mg by mouth 2 (two) times daily.  11/20/19   [provider]  promethazine-dextromethorphan (PROMETHAZINE-DM) 6.25-15 MG/5ML syrup Take 2.5 mLs by mouth 4 (four) times daily as needed for cough. 12/04/21   Hazel Sams, PA-C  sucralfate (CARAFATE) 1 g tablet Take 1 g by mouth 4 (four) times daily.  02/11/20   [provider]  TRUE METRIX BLOOD GLUCOSE TEST test strip 1 each 3 (three) times daily. 09/20/21   [provider]  TRUEplus Lancets 33G MISC Apply 1 each topically 3 (three) times  daily. 09/20/21   [provider]  esomeprazole (NEXIUM) 40 MG capsule Take 1 capsule (40 mg total) by mouth daily. Patient not taking: Reported on 07/08/2020 04/24/19 08/06/20  Charlesetta Shanks, MD    Family History Family History  Problem Relation Age of Onset   Allergic rhinitis Mother    Diabetes Father        type 2   Heart disease Father    Hypertension Father    Allergic rhinitis Father    Allergic rhinitis Sister    Collagen disease Sister    Heart disease Brother    Hypertension Brother    Angioedema Neg Hx    Asthma Neg Hx    Atopy Neg Hx    Eczema Neg Hx    Immunodeficiency Neg Hx    Urticaria Neg Hx     Social History Social History   Tobacco Use   Smoking status: Former    Packs/day: 0.50    Years: 30.00    Total pack years: 15.00    Types: Cigarettes    Quit date: 12/08/1994    Years since quitting: 27.6   Smokeless tobacco: Never  Vaping Use   Vaping Use: Never used  Substance Use Topics   Alcohol use: No   Drug use: No     Allergies   Crab [shellfish allergy], Erythromycin, Hm lidocaine patch [lidocaine], Erdafitinib, Fish allergy, Metoclopramide, Sulfacetamide, Sulfacetamide sodium-sulfur, Doxycycline, Levaquin [levofloxacin in d5w], Morphine, and Sulfa antibiotics   Review of Systems Review of Systems Per HPI  Physical Exam Triage Vital Signs ED Triage Vitals [08/09/22 1022]  Enc Vitals Group     BP 130/86     Pulse Rate 79     Resp 16     Temp 98.8 F (37.1 C)     Temp src      SpO2 95 %     Weight      Height      Head Circumference      Peak Flow      Pain Score 7     Pain Loc  Pain Edu?      Excl. in [place]?    No data found.  Updated Vital Signs BP 130/86   Pulse 79   Temp 98.8 F (37.1 C)   Resp 16   SpO2 95%   Visual Acuity Right Eye Distance:   Left Eye Distance:   Bilateral Distance:    Right Eye Near:   Left Eye Near:    Bilateral Near:     Physical Exam Constitutional:      General: She is not  in acute distress.    Appearance: Normal appearance. She is not toxic-appearing or diaphoretic.  HENT:     Head: Normocephalic and atraumatic.     Right Ear: Ear canal normal. A middle ear effusion is present. Tympanic membrane is not perforated, erythematous or bulging.     Left Ear: Ear canal normal. A middle ear effusion is present. Tympanic membrane is not perforated, erythematous or bulging.     Nose: Congestion present.     Mouth/Throat:     Mouth: Mucous membranes are moist.     Pharynx: No posterior oropharyngeal erythema.  Eyes:     Extraocular Movements: Extraocular movements intact.     Conjunctiva/sclera: Conjunctivae normal.     Pupils: Pupils are equal, round, and reactive to light.  Cardiovascular:     Rate and Rhythm: Normal rate and regular rhythm.     Pulses: Normal pulses.     Heart sounds: Normal heart sounds.  Pulmonary:     Effort: Pulmonary effort is normal. No respiratory distress.     Breath sounds: Normal breath sounds. No stridor. No wheezing, rhonchi or rales.  Abdominal:     General: Abdomen is flat. Bowel sounds are normal.     Palpations: Abdomen is soft.  Musculoskeletal:        General: Normal range of motion.     Cervical back: Normal range of motion.  Skin:    General: Skin is warm and dry.  Neurological:     General: No focal deficit present.     Mental Status: She is alert and oriented to person, place, and time. Mental status is at baseline.  Psychiatric:        Mood and Affect: Mood normal.        Behavior: Behavior normal.      UC Treatments / Results  Labs (all labs ordered are listed, but only abnormal results are displayed) Labs Reviewed - No data to display  EKG   Radiology DG Chest 2 View  Result Date: 08/09/2022 CLINICAL DATA:  Persistent cough EXAM: CHEST - 2 VIEW COMPARISON:  05/21/2021 FINDINGS: The heart size and mediastinal contours are within normal limits. Both lungs are clear. Disc degenerative disease of the  thoracic spine. IMPRESSION: No acute abnormality of the lungs. Electronically Signed   By: Delanna Ahmadi M.D.   On: 08/09/2022 11:25    Procedures Procedures (including critical care time)  Medications Ordered in UC Medications - No data to display  Initial Impression / Assessment and Plan / UC Course  I have reviewed the triage vital signs and the nursing notes.  Pertinent labs & imaging results that were available during my care of the patient were reviewed by me and considered in my medical decision making (see chart for details).     Chest x-ray was negative for any acute cardiopulmonary process.  Discussed that prednisone would be beneficial to patient given associated and persistent symptoms but patient declined prednisone.  Will trial  Augmentin antibiotic to help alleviate fluid in ears as well as upper respiratory symptoms.  Benzonatate prescribed to take as needed for cough.  Patient advised to follow-up if symptoms persist or worsen.  Patient verbalized understanding and was agreeable with plan. Final Clinical Impressions(s) / UC Diagnoses   Final diagnoses:  Acute upper respiratory infection  Persistent cough for 3 weeks or longer     Discharge Instructions      You have been prescribed antibiotic and a cough medication.  Please follow-up if symptoms persist or worsen.     ED Prescriptions     Medication Sig Dispense Auth. Provider   amoxicillin-clavulanate (AUGMENTIN) 875-125 MG tablet Take 1 tablet by mouth every 12 (twelve) hours. 14 tablet [place], [place] E, [place]   benzonatate (TESSALON) 100 MG capsule Take 1 capsule (100 mg total) by mouth every 8 (eight) hours as needed for cough. 21 capsule [place], Michele Rockers, [place]      PDMP not reviewed this encounter.   Teodora Medici, [place] 08/09/22 1144

## 2022-08-15 ENCOUNTER — Telehealth: Payer: Self-pay

## 2022-08-15 NOTE — Telephone Encounter (Signed)
I called the patient in regards to doing a fish challenge. She is still interested in doing the challenge. She will call back to schedule the appointment when she is feeling better. She is currently dealing with a upper respiratory infection. She has no question or concerns at this time for her infection. I am re mailing out the information for the fish challenge.

## 2022-08-23 ENCOUNTER — Other Ambulatory Visit: Payer: Self-pay | Admitting: Allergy

## 2022-08-24 ENCOUNTER — Other Ambulatory Visit: Payer: Self-pay | Admitting: Allergy

## 2022-09-27 NOTE — Progress Notes (Deleted)
Cardiology Office Note:    Date:  10/03/2022   ID:  Pamela Roberson, DOB [birth year], MRN [id number]  PCP:  Glendon Axe, MD   [facility] Providers Cardiologist:  None {    Referring MD: Glendon Axe, MD    History of Present Illness:    Pamela Roberson is a 66 y.o. female with a hx of COPD, DMII, HTN, HLD, OSA and asthma who presents to clinic for follow-up.  Patient was initially seen in 12/2021 for dyspnea on exertion. She had a myoview at OSH on 03/2021 which was normal. TTE 09/2021 with EF 60-65%, no significant valve disease.   Today, ***   Past Medical History:  Diagnosis Date   Arthritis    Asthma    Carotid stenosis, bilateral    Carpal tunnel syndrome    Chronic low back pain    COPD (chronic obstructive pulmonary disease) (HCC)    Diabetes mellitus without complication (HCC)    type II    Diabetic neuropathy (HCC)    Diverticulitis    Dyspnea    mild    Frequent falls    GERD (gastroesophageal reflux disease)    History of bronchitis    Hypercholesterolemia    Hypertension    IBS (irritable bowel syndrome)    Osteoarthritis    knee   Palpitations    Right ventricular enlargement    per pt report   Sleep apnea    mild but no sleep apnea    TMJ (dislocation of temporomandibular joint)    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    Vitamin D deficiency     Past Surgical History:  Procedure Laterality Date   ABDOMINAL HYSTERECTOMY  1993   CARPAL TUNNEL RELEASE Right    CERVICAL [place] SURGERY  04/29/2015   Dr Brynnan Sis, [place] N/A 03/03/2021   Procedure: ESOPHAGEAL MANOMETRY (EM);  Surgeon: Wilford Corner, MD;  Location: [facility];  Service: Endoscopy;  Laterality: N/A;   ESOPHAGOGASTRODUODENOSCOPY ENDOSCOPY     with esophagus being stretched twice per pt,    EYE SURGERY     fatty 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      feeding tube placement and removal   2015   NASAL SEPTUM SURGERY     ROTATOR CUFF  REPAIR Right    spurs removed from esophagus   2015   TONSILLECTOMY      Current Medications: Current Meds  Medication Sig   acyclovir (ZOVIRAX) 400 MG tablet Take 400 mg by mouth 2 (two) times daily.   ADVAIR HFA 115-21 MCG/ACT inhaler Inhale 2 puffs into the lungs 2 (two) times daily.   albuterol (PROAIR HFA) 108 (90 Base) MCG/ACT inhaler Inhale 1-2 puffs into the lungs every 6 (six) hours as needed for wheezing or shortness of breath.   albuterol (PROVENTIL) (2.5 MG/3ML) 0.083% nebulizer solution Take 3 mLs (2.5 mg total) by nebulization every 4 (four) hours as needed for wheezing or shortness of breath.   Alcohol Swabs (B-D SINGLE USE SWABS REGULAR) PADS Apply topically.   amoxicillin-clavulanate (AUGMENTIN) 875-125 MG tablet Take 1 tablet by mouth every 12 (twelve) hours.   Artificial Tear Ointment (DRY EYES OP) Place 1-2 drops into both eyes daily as needed (for dry eyes).    ascorbic acid (VITAMIN C) 1000 MG tablet Take 1 tablet by mouth daily.   atorvastatin (LIPITOR) 10 MG tablet Take 1 tablet by mouth daily.   Blood Glucose Calibration (TRUE  METRIX LEVEL 1) Low SOLN    Blood Glucose Monitoring Suppl (TRUE METRIX METER) w/Device KIT    cholecalciferol (VITAMIN D3) 25 MCG (1000 UNIT) tablet Take 2,000 Units by mouth daily.   cycloSPORINE (RESTASIS) 0.05 % ophthalmic emulsion Place 1 drop into both eyes 2 times daily.   EPINEPHrine 0.3 mg/0.3 mL IJ SOAJ injection INJECT ONE SYRINGE INTO THE MUSCLE AS NEEDED FOR ANAPHYLAXIS   famotidine (PEPCID) 20 MG tablet Take 1 tablet (20 mg total) by mouth 2 (two) times daily.   fluticasone (FLONASE) 50 MCG/ACT nasal spray Place 1 spray into both nostrils daily as needed for allergies or rhinitis.   gabapentin (NEURONTIN) 300 MG capsule Take 300 mg by mouth 3 (three) times daily.   JANUMET XR 50-500 MG TB24 Take 1 tablet by mouth daily.   levocetirizine (XYZAL) 5 MG tablet Take 1 tablet (5 mg total) by mouth every evening.   metoprolol succinate  (TOPROL-XL) 25 MG 24 hr tablet Take by mouth.   montelukast (SINGULAIR) 10 MG tablet Take 10 mg by mouth daily.    Multiple Vitamin (MULTIVITAMIN WITH MINERALS) TABS tablet Take 1 tablet by mouth daily.   ondansetron (ZOFRAN) 4 MG tablet Take 4 mg by mouth every 8 (eight) hours as needed.   pantoprazole (PROTONIX) 40 MG tablet Take 40 mg by mouth 2 (two) times daily.    sucralfate (CARAFATE) 1 g tablet Take 1 g by mouth 4 (four) times daily.    TRUE METRIX BLOOD GLUCOSE TEST test strip 1 each 3 (three) times daily.   TRUEplus Lancets 33G MISC Apply 1 each topically 3 (three) times daily.     Allergies:   Crab [shellfish allergy], Erythromycin, Hm lidocaine patch [lidocaine], Erdafitinib, Fish allergy, Metoclopramide, Sulfacetamide, Sulfacetamide sodium-sulfur, Doxycycline, Levaquin [levofloxacin in d5w], Morphine, and Sulfa antibiotics   Social History   Socioeconomic History   Marital status: Widowed    Spouse name: Not on file   Number of children: 3   Years of education: Not on file   Highest education level: 11th grade  Occupational History    Comment: NA  Tobacco Use   Smoking status: Former    Packs/day: 0.50    Years: 30.00    Total pack years: 15.00    Types: Cigarettes    Quit date: 12/08/1994    Years since quitting: 27.8   Smokeless tobacco: Never  Vaping Use   Vaping Use: Never used  Substance and Sexual Activity   Alcohol use: No   Drug use: No   Sexual activity: Not on file  Other Topics Concern   Not on file  Social History Narrative   Lives with daughter   Caffeine- coffee 12 oz   Social Determinants of Health   Financial Resource Strain: Not on file  Food Insecurity: Not on file  Transportation Needs: Not on file  Physical Activity: Not on file  Stress: Not on file  Social Connections: Not on file     Family History: The patient's family history includes Allergic rhinitis in her father, mother, and sister; Collagen disease in her sister; Diabetes  in her father; Heart disease in her brother and father; Hypertension in her brother and father. There is no history of Angioedema, Asthma, Atopy, Eczema, Immunodeficiency, or Urticaria.  ROS:   Please see the history of present illness.    Review of Systems  Constitutional:  Negative for chills and fever.  HENT:  Negative for congestion and sinus pain.   Eyes:  Negative  for blurred vision.  Respiratory:  Positive for shortness of breath.   Cardiovascular:  Negative for chest pain, palpitations, orthopnea, claudication, leg swelling and PND.  Gastrointestinal:  Negative for heartburn and nausea.  Genitourinary:  Negative for urgency.  Musculoskeletal:  Negative for joint pain and myalgias.  Skin:  Negative for itching and rash.  Neurological:  Positive for dizziness. Negative for headaches.  Endo/Heme/Allergies:  Does not bruise/bleed easily.  Psychiatric/Behavioral:  The patient is not nervous/anxious and does not have insomnia.     All other systems reviewed and are negative.  EKGs/Labs/Other Studies Reviewed:    The following studies were reviewed today: No prior cardiovascular studies  EKG:   01/04/22; Sinus rhythm, rate 78 bpm  Recent Labs: No results found for requested labs within last 365 days.  Recent Lipid Panel No results found for: "CHOL", "TRIG", "HDL", "CHOLHDL", "VLDL", "LDLCALC", "LDLDIRECT"   Risk Assessment/Calculations:           Physical Exam:    VS:  BP (!) 118/56   Pulse 82   Ht 5' 6"  (1.676 m)   Wt 213 lb 9.6 oz (96.9 kg)   SpO2 98%   BMI 34.48 kg/m     Wt Readings from Last 3 Encounters:  10/03/22 213 lb 9.6 oz (96.9 kg)  07/02/22 210 lb (95.3 kg)  05/12/22 213 lb 4 oz (96.7 kg)     GEN: Well nourished, well developed in no acute distress HEENT: Normal NECK: No JVD; No carotid bruits CARDIAC: RRR, 2/6 systolic murmur, no rubs or gallops RESPIRATORY:  Clear to auscultation without rales, wheezing or rhonchi  ABDOMEN: Soft, non-tender,  non-distended MUSCULOSKELETAL:  No edema; No deformity  SKIN: Warm and dry NEUROLOGIC:  Alert and oriented x 3 PSYCHIATRIC:  Normal affect   ASSESSMENT:    No diagnosis found.  PLAN:    In order of problems listed above:  #Dyspnea on Exertion: Myoview 03/2021 without ischemia or infarction. TTE with LVEF 60-65%, no significant valve disease.   #HTN: Well controlled. -Continue metop 34m XL daily  #DMII: -Continue janumet 50-545m #COPD: #Asthma: -Follow-up PCP          Medication Adjustments/Labs and Tests Ordered: Current medicines are reviewed at length with the patient today.  Concerns regarding medicines are outlined above.  No orders of the defined types were placed in this encounter.  No orders of the defined types were placed in this encounter.   There are no Patient Instructions on file for this visit.     Signed, HeFreada BergeronMD  10/03/2022 10:57 AM    [place]

## 2022-09-30 ENCOUNTER — Ambulatory Visit (INDEPENDENT_AMBULATORY_CARE_PROVIDER_SITE_OTHER): Payer: Medicare HMO

## 2022-09-30 ENCOUNTER — Encounter (HOSPITAL_COMMUNITY): Payer: Self-pay

## 2022-09-30 ENCOUNTER — Ambulatory Visit (HOSPITAL_COMMUNITY)
Admission: EM | Admit: 2022-09-30 | Discharge: 2022-09-30 | Disposition: A | Discharge status: Home / Self Care | Payer: Medicare HMO | Attending: Family Medicine | Admitting: Family Medicine

## 2022-09-30 ENCOUNTER — Other Ambulatory Visit: Payer: Self-pay

## 2022-09-30 DIAGNOSIS — J441 Chronic obstructive pulmonary disease with (acute) exacerbation: Secondary | ICD-10-CM | POA: Diagnosis not present

## 2022-09-30 DIAGNOSIS — K219 Gastro-esophageal reflux disease without esophagitis: Secondary | ICD-10-CM | POA: Diagnosis not present

## 2022-09-30 DIAGNOSIS — R0789 Other chest pain: Secondary | ICD-10-CM | POA: Diagnosis not present

## 2022-09-30 DIAGNOSIS — R079 Chest pain, unspecified: Secondary | ICD-10-CM | POA: Diagnosis not present

## 2022-09-30 MED ORDER — AMOXICILLIN-POT CLAVULANATE 875-125 MG PO TABS: 1.0000 | ORAL_TABLET | Freq: Two times a day (BID) | ORAL | 0 refills | Status: DC

## 2022-09-30 MED ORDER — BENZONATATE 100 MG PO CAPS: 100.0000 mg | ORAL_CAPSULE | Freq: Three times a day (TID) | ORAL | 0 refills | Status: DC

## 2022-09-30 NOTE — Discharge Instructions (Addendum)
You were seen today for upper respiratory symptoms, and chest wall pain.  Your xray was normal today.  Given you have had symptoms for 3 weeks, I am giving you an antibiotic to take to help with the drainage and cough.  I am also sending out a cough medication.  Your chest and back pain appear muscular, likely from all the coughing.  I recommend you trial heat/ice to help with pain, as well as tylenol.  This should improve once the cough and mucous has cleared.

## 2022-09-30 NOTE — ED Provider Notes (Signed)
[place]    CSN: [id number] Arrival date & time: 09/30/22  0813      History   Chief Complaint Chief Complaint  Patient presents with   Chest Pain   Cough    HPI Pamela Roberson is a 66 y.o. female.   The last 3 weeks she has had mucous in her throat, coughing.  She has had runny nose, congestion.  Now this has moved into her chest. Having a dry cough without any sputum production.  Having pain at the left chest.  Mid chest to under the left breast area.  She is burping a lot, mostly at night while laying down, and feels fluid in her throat.  She had a chill but no fever.  She has asthma, and using her inhalers, but not working for this.  She does take medications for gerd as well.        Past Medical History:  Diagnosis Date   Arthritis    Asthma    Carotid stenosis, bilateral    Carpal tunnel syndrome    Chronic low back pain    COPD (chronic obstructive pulmonary disease) (HCC)    Diabetes mellitus without complication (HCC)    type II    Diabetic neuropathy (HCC)    Diverticulitis    Dyspnea    mild    Frequent falls    GERD (gastroesophageal reflux disease)    History of bronchitis    Hypercholesterolemia    Hypertension    IBS (irritable bowel syndrome)    Osteoarthritis    knee   Palpitations    Right ventricular enlargement    per pt report   Sleep apnea    mild but no sleep apnea    TMJ (dislocation of temporomandibular joint)    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    Vitamin D deficiency     Patient Active Problem List   Diagnosis Date Noted   Pain in left knee 02/15/2022   Sciatica, left side 02/15/2022   Pain in left shoulder 11/16/2021   Internal hemorrhoids 11/03/2021   Leg cramps 11/03/2021   Myalgia 11/03/2021   Adenomatous polyp of colon 12/04/2020   Anal fissure 12/04/2020   Chronic idiopathic constipation 12/04/2020   Constipation 12/04/2020   Dysphagia 12/04/2020   Gastroesophageal reflux disease  12/04/2020   Rectal pain 12/04/2020   Right upper quadrant pain 12/04/2020   Dizzy 12/11/2017   Pharyngeal dysphagia 12/11/2017   Post-nasal drip 12/11/2017   Referred ear pain, bilateral 12/11/2017   Diabetes mellitus without complication ([place]) 18/84/1660   Family history of glaucoma 06/29/2017   Hyperopia of both eyes with astigmatism and presbyopia 06/29/2017   Keratoconjunctivitis sicca of both eyes not specified as Sjogren's 06/29/2017   Nuclear sclerotic cataract of both eyes 06/29/2017   Vitreous floater, bilateral 06/29/2017   Biceps tendinitis of right shoulder 04/05/2017   Impingement syndrome of right shoulder 04/05/2017   Arthritis of right acromioclavicular joint 04/05/2017   Partial nontraumatic tear of rotator cuff, right 04/05/2017    Past Surgical History:  Procedure Laterality Date   ABDOMINAL HYSTERECTOMY  1993   CARPAL TUNNEL RELEASE Right    CERVICAL [facility] SURGERY  04/29/2015   Dr Jordin Sis, [place] N/A 03/03/2021   Procedure: ESOPHAGEAL MANOMETRY (EM);  Surgeon: Wilford Corner, MD;  Location: [facility];  Service: Endoscopy;  Laterality: N/A;   ESOPHAGOGASTRODUODENOSCOPY ENDOSCOPY     with esophagus being stretched twice  per pt,    EYE SURGERY     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      Home Medications    Prior to Admission medications   Medication Sig Start Date End Date Taking? Authorizing Provider  acyclovir (ZOVIRAX) 400 MG tablet Take 400 mg by mouth 2 (two) times daily.    [provider]  ADVAIR [facility] 384-66 MCG/ACT inhaler Inhale 2 puffs into the lungs 2 (two) times daily. 06/10/22   Kennith Gain, MD  albuterol (PROAIR HFA) 108 (90 Base) MCG/ACT inhaler Inhale 1-2 puffs into the lungs  every 6 (six) hours as needed for wheezing or shortness of breath. 06/14/20   Zigmund Gottron, NP  albuterol (PROVENTIL) (2.5 MG/3ML) 0.083% nebulizer solution Take 3 mLs (2.5 mg total) by nebulization every 4 (four) hours as needed for wheezing or shortness of breath. 03/11/19   Raylene Everts, MD  Alcohol Swabs (B-D SINGLE USE SWABS REGULAR) PADS Apply topically. 05/16/21   [provider]  Artificial Tear Ointment (DRY EYES OP) Place 1-2 drops into both eyes daily as needed (for dry eyes).     [provider]  ascorbic acid (VITAMIN C) 1000 MG tablet Take 1 tablet by mouth daily.    [provider]  atorvastatin (LIPITOR) 10 MG tablet Take 1 tablet by mouth daily.    [provider]  Blood Glucose Calibration (TRUE METRIX LEVEL 1) Low SOLN  09/20/21   [provider]  Blood Glucose Monitoring Suppl (TRUE METRIX METER) w/Device KIT  09/20/21   [provider]  cholecalciferol (VITAMIN D3) 25 MCG (1000 UNIT) tablet Take 2,000 Units by mouth daily.    [provider]  cycloSPORINE (RESTASIS) 0.05 % ophthalmic emulsion Place 1 drop into both eyes 2 times daily. 03/08/22   [provider]  dicyclomine (BENTYL) 20 MG tablet Take 20 mg by mouth 3 (three) times daily. 10/17/19   [provider]  EPINEPHrine 0.3 mg/0.3 mL IJ SOAJ injection INJECT ONE SYRINGE INTO THE MUSCLE AS NEEDED FOR ANAPHYLAXIS 08/24/22   Kennith Gain, MD  famotidine (PEPCID) 20 MG tablet Take 1 tablet (20 mg total) by mouth 2 (two) times daily. 05/12/22   Kennith Gain, MD  fluticasone (FLONASE) 50 MCG/ACT nasal spray Place 1 spray into both nostrils daily as needed for allergies or rhinitis.    [provider]  gabapentin (NEURONTIN) 300 MG capsule Take 300 mg by mouth 3 (three) times daily.    [provider]  JANUMET XR 50-500 MG TB24 Take 1 tablet by mouth daily. 09/27/20   [provider]   levocetirizine (XYZAL) 5 MG tablet Take 1 tablet (5 mg total) by mouth every evening. 03/23/22   Scot Jun, FNP  meclizine (ANTIVERT) 25 MG tablet Take by mouth. 08/26/21   [provider]  metoprolol succinate (TOPROL-XL) 25 MG 24 hr tablet Take by mouth.    [provider]  montelukast (SINGULAIR) 10 MG tablet Take 10 mg by mouth daily.     [provider]  Multiple Vitamin (MULTIVITAMIN WITH MINERALS) TABS tablet Take 1 tablet by mouth daily.    [provider]  nystatin (MYCOSTATIN) 100000 UNIT/ML suspension Take 5 mLs (500,000 Units total) by mouth 4 (four) times daily. 03/23/22   Scot Jun, FNP  nystatin cream (MYCOSTATIN) Apply 1 application. topically 2 (two) times daily. 03/18/21   [provider]  ondansetron (ZOFRAN) 4 MG tablet Take 4 mg by mouth every 8 (eight) hours as needed. 02/15/21   [provider]  pantoprazole (PROTONIX) 40 MG tablet Take 40 mg by mouth 2 (two) times daily.  11/20/19   [provider]  promethazine-dextromethorphan (PROMETHAZINE-DM) 6.25-15 MG/5ML syrup Take 2.5 mLs by mouth 4 (four) times daily as needed for cough. 12/04/21   Hazel Sams, PA-C  sucralfate (CARAFATE) 1 g tablet Take 1 g by mouth 4 (four) times daily.  02/11/20   [provider]  TRUE METRIX BLOOD GLUCOSE TEST test strip 1 each 3 (three) times daily. 09/20/21   [provider]  TRUEplus Lancets 33G MISC Apply 1 each topically 3 (three) times daily. 09/20/21   [provider]  esomeprazole (NEXIUM) 40 MG capsule Take 1 capsule (40 mg total) by mouth daily. Patient not taking: Reported on 07/08/2020 04/24/19 08/06/20  Charlesetta Shanks, MD    Family History Family History  Problem Relation Age of Onset   Allergic rhinitis Mother    Diabetes Father        type 2   Heart disease Father    Hypertension Father    Allergic rhinitis Father    Allergic rhinitis Sister    Collagen disease Sister     Heart disease Brother    Hypertension Brother    Angioedema Neg Hx    Asthma Neg Hx    Atopy Neg Hx    Eczema Neg Hx    Immunodeficiency Neg Hx    Urticaria Neg Hx     Social History Social History   Tobacco Use   Smoking status: Former    Packs/day: 0.50    Years: 30.00    Total pack years: 15.00    Types: Cigarettes    Quit date: 12/08/1994    Years since quitting: 27.8   Smokeless tobacco: Never  Vaping Use   Vaping Use: Never used  Substance Use Topics   Alcohol use: No   Drug use: No     Allergies   Crab [shellfish allergy], Erythromycin, Hm lidocaine patch [lidocaine], Erdafitinib, Fish allergy, Metoclopramide, Sulfacetamide, Sulfacetamide sodium-sulfur, Doxycycline, Levaquin [levofloxacin in d5w], Morphine, and Sulfa antibiotics   Review of Systems Review of Systems  Constitutional:  Positive for chills. Negative for fever.  HENT:  Positive for congestion and rhinorrhea.   Respiratory:  Positive for cough. Negative for shortness of breath and wheezing.   Cardiovascular:  Positive for chest pain.  Gastrointestinal: Negative.   Genitourinary: Negative.   Musculoskeletal: Negative.   Skin: Negative.   Psychiatric/Behavioral: Negative.       Physical Exam Triage Vital Signs ED Triage Vitals  Enc Vitals Group     BP 09/30/22 0827 123/71     Pulse Rate 09/30/22 0827 73     Resp 09/30/22 0827 18     Temp 09/30/22 0827 98.2 F (36.8 C)     Temp Source 09/30/22 0827 Oral     SpO2 09/30/22 0827 100 %     Weight --      Height --      Head Circumference --      Peak Flow --      Pain Score 09/30/22 0828 7  Pain Loc --      Pain Edu? --      Excl. in [place]? --    No data found.  Updated Vital Signs BP 123/71 (BP Location: Left Arm)   Pulse 73   Temp 98.2 F (36.8 C) (Oral)   Resp 18   SpO2 100%   Visual Acuity Right Eye Distance:   Left Eye Distance:   Bilateral Distance:    Right Eye Near:   Left Eye Near:    Bilateral Near:      Physical Exam Constitutional:      Appearance: She is well-developed.  HENT:     Right Ear: Tympanic membrane normal.     Left Ear: Tympanic membrane normal.     Nose: Congestion present.     Right Sinus: No maxillary sinus tenderness or frontal sinus tenderness.     Left Sinus: No maxillary sinus tenderness or frontal sinus tenderness.     Mouth/Throat:     Mouth: Mucous membranes are moist.     Pharynx: No oropharyngeal exudate or posterior oropharyngeal erythema.  Cardiovascular:     Rate and Rhythm: Normal rate and regular rhythm.  Pulmonary:     Effort: Pulmonary effort is normal. No respiratory distress.     Breath sounds: Normal breath sounds. No wheezing.  Chest:     Chest wall: Tenderness present.  Abdominal:     Palpations: Abdomen is soft.     Comments: + epigastric tenderness  Musculoskeletal:     Cervical back: Normal range of motion.     Comments: TTP to the sternal area, left upper chest wall, under the left breast, and the left upper/mid back  Neurological:     General: No focal deficit present.     Mental Status: She is alert.  Psychiatric:        Mood and Affect: Mood normal.      UC Treatments / Results  Labs (all labs ordered are listed, but only abnormal results are displayed) Labs Reviewed - No data to display  EKG NSR;  normal EKG  Radiology DG Chest 2 View  Result Date: 09/30/2022 CLINICAL DATA:  Left chest pain. EXAM: CHEST - 2 VIEW COMPARISON:  08/09/2022 FINDINGS: The heart size and mediastinal contours are within normal limits. Both lungs are clear. The visualized skeletal structures are unremarkable. IMPRESSION: No active cardiopulmonary disease. Electronically Signed   By: Kathreen Devoid M.D.   On: 09/30/2022 09:11    Procedures Procedures (including critical care time)  Medications Ordered in UC Medications - No data to display  Initial Impression / Assessment and Plan / UC Course  I have reviewed the triage vital signs and the  nursing notes.  Pertinent labs & imaging results that were available during my care of the patient were reviewed by me and considered in my medical decision making (see chart for details).    Final Clinical Impressions(s) / UC Diagnoses   Final diagnoses:  Chest wall pain  Gastroesophageal reflux disease without esophagitis  COPD exacerbation ([place])     Discharge Instructions      You were seen today for upper respiratory symptoms, and chest wall pain.  Your xray was normal today.  Given you have had symptoms for 3 weeks, I am giving you an antibiotic to take to help with the drainage and cough.  I am also sending out a cough medication.  Your chest and back pain appear muscular, likely from all the coughing.  I recommend you  trial heat/ice to help with pain, as well as tylenol.  This should improve once the cough and mucous has cleared.     ED Prescriptions     Medication Sig Dispense Auth. Provider   amoxicillin-clavulanate (AUGMENTIN) 875-125 MG tablet Take 1 tablet by mouth every 12 (twelve) hours. 14 tablet Makalah Asberry, MD   benzonatate (TESSALON) 100 MG capsule Take 1 capsule (100 mg total) by mouth every 8 (eight) hours. 21 capsule Rondel Oh, MD      PDMP not reviewed this encounter.   Rondel Oh, MD 09/30/22 [phone]

## 2022-09-30 NOTE — ED Triage Notes (Signed)
Pt states had a sinus infection 3wks ago but still having the headache. States having lt sided chest pain, dry cough, and burping x2 days. States hx of pleurisy and PNA, feels similar.

## 2022-10-03 ENCOUNTER — Ambulatory Visit: Payer: Medicare HMO | Attending: Cardiology | Admitting: Cardiology

## 2022-10-03 ENCOUNTER — Encounter: Payer: Self-pay | Admitting: Cardiology

## 2022-10-03 VITALS — BP 118/56 | HR 82 | Ht 66.0 in | Wt 213.6 lb

## 2022-10-03 DIAGNOSIS — I6523 Occlusion and stenosis of bilateral carotid arteries: Secondary | ICD-10-CM | POA: Diagnosis not present

## 2022-10-03 DIAGNOSIS — E119 Type 2 diabetes mellitus without complications: Secondary | ICD-10-CM

## 2022-10-03 DIAGNOSIS — I10 Essential (primary) hypertension: Secondary | ICD-10-CM | POA: Diagnosis not present

## 2022-10-03 DIAGNOSIS — R0602 Shortness of breath: Secondary | ICD-10-CM

## 2022-10-03 DIAGNOSIS — R0609 Other forms of dyspnea: Secondary | ICD-10-CM

## 2022-10-03 DIAGNOSIS — R072 Precordial pain: Secondary | ICD-10-CM

## 2022-10-03 NOTE — Progress Notes (Signed)
Cardiology Office Note:    Date:  10/03/2022   ID:  Pamela Roberson, DOB [birth year], MRN [id number]  PCP:  Glendon Axe, MD   [facility] Providers Cardiologist:  None {  Referring MD: Glendon Axe, MD    History of Present Illness:    Pamela Roberson is a 66 y.o. female with a hx of COPD, DMII, HTN, HLD, OSA and asthma who presents to clinic for follow-up.  Patient was initially seen in 12/2021 for dyspnea on exertion. She had a myoview at OSH on 03/2021 which was normal. TTE 09/2021 with EF 60-65%, no significant valve disease.   Today, the patient states that in the last couple weeks she has been experiencing left chest pains. At the time she was also suffering from cough, congestion, rhinorrhea, and shortness of breath. Often she would wake up with a headache as well. She presented to urgent care 09/30/2022 where it was felt she may have an URI. Her EKG performed at that time showed normal sinus rhythm. It was thought her chest pain was likely caused by her coughing. She was discharged with antibiotic therapy.  At this visit she appears well and is feeling much better. She confirms that her symptoms have largely resolved. Her shortness of breath is not as severe as it was last week.  She denies any palpitations, or peripheral edema. No lightheadedness, syncope, orthopnea, or PND.   Past Medical History:  Diagnosis Date   Arthritis    Asthma    Carotid stenosis, bilateral    Carpal tunnel syndrome    Chronic low back pain    COPD (chronic obstructive pulmonary disease) (HCC)    Diabetes mellitus without complication (HCC)    type II    Diabetic neuropathy (HCC)    Diverticulitis    Dyspnea    mild    Frequent falls    GERD (gastroesophageal reflux disease)    History of bronchitis    Hypercholesterolemia    Hypertension    IBS (irritable bowel syndrome)    Osteoarthritis    knee   Palpitations    Right ventricular enlargement    per pt report    Sleep apnea    mild but no sleep apnea    TMJ (dislocation of temporomandibular joint)    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    Vitamin D deficiency     Past Surgical History:  Procedure Laterality Date   ABDOMINAL HYSTERECTOMY  1993   CARPAL TUNNEL RELEASE Right    CERVICAL DISC SURGERY  04/29/2015   Dr Precilla Sis, [place] N/A 03/03/2021   Procedure: ESOPHAGEAL MANOMETRY (EM);  Surgeon: Wilford Corner, MD;  Location: [facility];  Service: Endoscopy;  Laterality: N/A;   ESOPHAGOGASTRODUODENOSCOPY ENDOSCOPY     with esophagus being stretched twice per pt,    EYE SURGERY     fatty 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      feeding tube placement and removal   2015   NASAL SEPTUM SURGERY     ROTATOR CUFF REPAIR Right    spurs removed from esophagus   2015   TONSILLECTOMY      Current Medications: Current Meds  Medication Sig   acyclovir (ZOVIRAX) 400 MG tablet Take 400 mg by mouth 2 (two) times daily.   ADVAIR HFA 115-21 MCG/ACT inhaler Inhale 2 puffs into the lungs 2 (two) times daily.   albuterol (PROAIR HFA) 108 (90 Base) MCG/ACT inhaler Inhale 1-2  puffs into the lungs every 6 (six) hours as needed for wheezing or shortness of breath.   albuterol (PROVENTIL) (2.5 MG/3ML) 0.083% nebulizer solution Take 3 mLs (2.5 mg total) by nebulization every 4 (four) hours as needed for wheezing or shortness of breath.   Alcohol Swabs (B-D SINGLE USE SWABS REGULAR) PADS Apply topically.   amoxicillin-clavulanate (AUGMENTIN) 875-125 MG tablet Take 1 tablet by mouth every 12 (twelve) hours.   Artificial Tear Ointment (DRY EYES OP) Place 1-2 drops into both eyes daily as needed (for dry eyes).    ascorbic acid (VITAMIN C) 1000 MG tablet Take 1 tablet by mouth daily.   atorvastatin (LIPITOR) 10 MG tablet Take 1 tablet by mouth daily.   Blood Glucose Calibration (TRUE METRIX LEVEL 1) Low SOLN    Blood Glucose Monitoring Suppl (TRUE METRIX METER) w/Device KIT     cholecalciferol (VITAMIN D3) 25 MCG (1000 UNIT) tablet Take 2,000 Units by mouth daily.   cycloSPORINE (RESTASIS) 0.05 % ophthalmic emulsion Place 1 drop into both eyes 2 times daily.   EPINEPHrine 0.3 mg/0.3 mL IJ SOAJ injection INJECT ONE SYRINGE INTO THE MUSCLE AS NEEDED FOR ANAPHYLAXIS   famotidine (PEPCID) 20 MG tablet Take 1 tablet (20 mg total) by mouth 2 (two) times daily.   fluticasone (FLONASE) 50 MCG/ACT nasal spray Place 1 spray into both nostrils daily as needed for allergies or rhinitis.   gabapentin (NEURONTIN) 300 MG capsule Take 300 mg by mouth 3 (three) times daily.   JANUMET XR 50-500 MG TB24 Take 1 tablet by mouth daily.   levocetirizine (XYZAL) 5 MG tablet Take 1 tablet (5 mg total) by mouth every evening.   metoprolol succinate (TOPROL-XL) 25 MG 24 hr tablet Take by mouth.   montelukast (SINGULAIR) 10 MG tablet Take 10 mg by mouth daily.    Multiple Vitamin (MULTIVITAMIN WITH MINERALS) TABS tablet Take 1 tablet by mouth daily.   ondansetron (ZOFRAN) 4 MG tablet Take 4 mg by mouth every 8 (eight) hours as needed.   pantoprazole (PROTONIX) 40 MG tablet Take 40 mg by mouth 2 (two) times daily.    sucralfate (CARAFATE) 1 g tablet Take 1 g by mouth 4 (four) times daily.    TRUE METRIX BLOOD GLUCOSE TEST test strip 1 each 3 (three) times daily.   TRUEplus Lancets 33G MISC Apply 1 each topically 3 (three) times daily.     Allergies:   Crab [shellfish allergy], Erythromycin, Hm lidocaine patch [lidocaine], Erdafitinib, Fish allergy, Metoclopramide, Sulfacetamide, Sulfacetamide sodium-sulfur, Doxycycline, Levaquin [levofloxacin in d5w], Morphine, and Sulfa antibiotics   Social History   Socioeconomic History   Marital status: Widowed    Spouse name: Not on file   Number of children: 3   Years of education: Not on file   Highest education level: 11th grade  Occupational History    Comment: NA  Tobacco Use   Smoking status: Former    Packs/day: 0.50    Years: 30.00     Total pack years: 15.00    Types: Cigarettes    Quit date: 12/08/1994    Years since quitting: 27.8   Smokeless tobacco: Never  Vaping Use   Vaping Use: Never used  Substance and Sexual Activity   Alcohol use: No   Drug use: No   Sexual activity: Not on file  Other Topics Concern   Not on file  Social History Narrative   Lives with daughter   Caffeine- coffee 12 oz   Social Determinants of Health  Financial Resource Strain: Not on file  Food Insecurity: Not on file  Transportation Needs: Not on file  Physical Activity: Not on file  Stress: Not on file  Social Connections: Not on file     Family History: The patient's family history includes Allergic rhinitis in her father, mother, and sister; Collagen disease in her sister; Diabetes in her father; Heart disease in her brother and father; Hypertension in her brother and father. There is no history of Angioedema, Asthma, Atopy, Eczema, Immunodeficiency, or Urticaria.  ROS:   Please see the history of present illness.    Review of Systems  Constitutional:  Negative for chills and fever.  HENT:  Negative for congestion and sinus pain.   Eyes:  Negative for blurred vision.  Respiratory:  Positive for shortness of breath.   Cardiovascular:  Negative for chest pain, palpitations, orthopnea, claudication, leg swelling and PND.  Gastrointestinal:  Negative for heartburn and nausea.  Genitourinary:  Negative for urgency.  Musculoskeletal:  Negative for joint pain and myalgias.  Skin:  Negative for itching and rash.  Neurological:  Positive for dizziness. Negative for headaches.  Endo/Heme/Allergies:  Does not bruise/bleed easily.  Psychiatric/Behavioral:  The patient is not nervous/anxious and does not have insomnia.    All other systems reviewed and are negative.  EKGs/Labs/Other Studies Reviewed:    The following studies were reviewed today:  Echo  09/17/2021: Conclusions: 1. Normal study: normal cardiac chamber sizes and  function; normal valve anatomy and  function; no pericardial effusion or intracardiac mass. No intracardiac shunt(s) by 2-Dimensional and color flow imaging. Normal thoracic aorta and aortic arch.   EKG:  EKG is personally reviewed. 10/03/2022:  EKG was not ordered. 01/04/22:  Sinus rhythm, rate 78 bpm  Recent Labs: No results found for requested labs within last 365 days.   Recent Lipid Panel No results found for: "CHOL", "TRIG", "HDL", "CHOLHDL", "VLDL", "LDLCALC", "LDLDIRECT"   Risk Assessment/Calculations:           Physical Exam:    VS:  BP (!) 118/56   Pulse 82   Ht _0  (1.676 m)   Wt 213 lb 9.6 oz (96.9 kg)   SpO2 98%   BMI 34.48 kg/m     Wt Readings from Last 3 Encounters:  10/03/22 213 lb 9.6 oz (96.9 kg)  07/02/22 210 lb (95.3 kg)  05/12/22 213 lb 4 oz (96.7 kg)     GEN: Well nourished, well developed in no acute distress HEENT: Normal NECK: No JVD; No carotid bruits CARDIAC: RRR, 2/6 systolic murmur, no rubs or gallops RESPIRATORY:  Clear to auscultation without rales, wheezing or rhonchi  ABDOMEN: Soft, non-tender, non-distended MUSCULOSKELETAL:  No edema; No deformity  SKIN: Warm and dry NEUROLOGIC:  Alert and oriented x 3 PSYCHIATRIC:  Normal affect   ASSESSMENT:    1. Dyspnea on exertion   2. Bilateral carotid artery stenosis   3. SOB (shortness of breath)   4. Primary hypertension   5. Diabetes mellitus with coincident hypertension ([place])   6. Precordial pain     PLAN:    In order of problems listed above:  #Dyspnea on Exertion: Myoview 03/2021 without ischemia or infarction. TTE with LVEF 60-65%, no significant valve disease.   #Chest Pain: Likely due to recent viral infection and coughing. Has largely resolved today.   #HTN: Well controlled. -Continue metop 44m XL daily  #DMII: -Continue janumet 50-538m #COPD: #Asthma: -Follow-up PCP  #Bilateral Carotid Artery Disease: -Repeat carotids for screening -  Continue lipitor  52m daily   Follow-up:  6 months.    Medication Adjustments/Labs and Tests Ordered: Current medicines are reviewed at length with the patient today.  Concerns regarding medicines are outlined above.   Orders Placed This Encounter  Procedures   VAS [place]OTID   No orders of the defined types were placed in this encounter.  Patient Instructions  Medication Instructions:   Your physician recommends that you continue on your current medications as directed. Please refer to the Current Medication list given to you today.  *If you need a refill on your cardiac medications before your next appointment, please call your pharmacy*   Testing/Procedures:  Your physician has requested that you have a carotid duplex. This test is an ultrasound of the carotid arteries in your neck. It looks at blood flow through these arteries that supply the brain with blood. Allow one hour for this exam. There are no restrictions or special instructions.   Follow-Up: [facility] and your [facility] needs are our [facility].  As part of our continuing mission to provide you with exceptional heart care, we have created designated Provider Care Teams.  These Care Teams include your primary Cardiologist (physician) and Advanced Practice Providers (APPs -  Physician Assistants and Nurse Practitioners) who all work together to provide you with the care you need, when you need it.  We recommend signing up for the patient portal called "MyChart".  Sign up information is provided on this After Visit Summary.  MyChart is used to connect with patients for Virtual Visits (Telemedicine).  Patients are able to view lab/test results, encounter notes, upcoming appointments, etc.  Non-urgent messages can be sent to your provider as well.   To learn more about what you can do with MyChart, go to [email or web address]    Your next appointment:   6 month(s)  The format for your next appointment:   In  Person  Provider:   DR. PJohney Frame  Important Information About Sugar        I,Mathew Stumpf,acting as a scribe for HFreada Bergeron MD.,have documented all relevant documentation on the behalf of HFreada Bergeron MD,as directed by  HFreada Bergeron MD while in the presence of HFreada Bergeron MD.  I, HFreada Bergeron MD, have reviewed all documentation for this visit. The documentation on 10/03/22 for the exam, diagnosis, procedures, and orders are all accurate and complete.   Signed, HFreada Bergeron MD  10/03/2022 11:33 AM    [place]

## 2022-10-03 NOTE — Patient Instructions (Signed)
Medication Instructions:   Your physician recommends that you continue on your current medications as directed. Please refer to the Current Medication list given to you today.  *If you need a refill on your cardiac medications before your next appointment, please call your pharmacy*   Testing/Procedures:  Your physician has requested that you have a carotid duplex. This test is an ultrasound of the carotid arteries in your neck. It looks at blood flow through these arteries that supply the brain with blood. Allow one hour for this exam. There are no restrictions or special instructions.   Follow-Up: At [facility], you and your health needs are our priority.  As part of our continuing mission to provide you with exceptional heart care, we have created designated Provider Care Teams.  These Care Teams include your primary Cardiologist (physician) and Advanced Practice Providers (APPs -  Physician Assistants and Nurse Practitioners) who all work together to provide you with the care you need, when you need it.  We recommend signing up for the patient portal called "MyChart".  Sign up information is provided on this After Visit Summary.  MyChart is used to connect with patients for Virtual Visits (Telemedicine).  Patients are able to view lab/test results, encounter notes, upcoming appointments, etc.  Non-urgent messages can be sent to your provider as well.   To learn more about what you can do with MyChart, go to [email or web address].    Your next appointment:   6 month(s)  The format for your next appointment:   In Person  Provider:   DR. Johney Frame   Important Information About Sugar

## 2022-10-13 ENCOUNTER — Ambulatory Visit (HOSPITAL_COMMUNITY)
Admission: RE | Admit: 2022-10-13 | Discharge: 2022-10-13 | Disposition: A | Discharge status: Home / Self Care | Payer: Medicare Other | Source: Ambulatory Visit | Attending: Cardiology | Admitting: Cardiology

## 2022-10-13 DIAGNOSIS — I6523 Occlusion and stenosis of bilateral carotid arteries: Secondary | ICD-10-CM | POA: Insufficient documentation

## 2022-10-25 ENCOUNTER — Ambulatory Visit (INDEPENDENT_AMBULATORY_CARE_PROVIDER_SITE_OTHER): Payer: Medicare Other | Admitting: Podiatry

## 2022-10-25 ENCOUNTER — Encounter: Payer: Self-pay | Admitting: Podiatry

## 2022-10-25 DIAGNOSIS — E1142 Type 2 diabetes mellitus with diabetic polyneuropathy: Secondary | ICD-10-CM | POA: Diagnosis not present

## 2022-10-25 DIAGNOSIS — M79674 Pain in right toe(s): Secondary | ICD-10-CM

## 2022-10-25 DIAGNOSIS — B351 Tinea unguium: Secondary | ICD-10-CM

## 2022-10-25 DIAGNOSIS — L84 Corns and callosities: Secondary | ICD-10-CM

## 2022-10-25 DIAGNOSIS — M79675 Pain in left toe(s): Secondary | ICD-10-CM

## 2022-10-25 NOTE — Progress Notes (Signed)
Subjective:  Patient ID: Pamela Roberson, female    DOB: Jan 03, 1956,  MRN: [id number]  Pamela Roberson presents to clinic today for at risk foot care with history of diabetic neuropathy and callus(es) b/l lower extremities and painful thick toenails that are difficult to trim. Painful toenails interfere with ambulation. Aggravating factors include wearing enclosed shoe gear. Pain is relieved with periodic professional debridement. Painful calluses are aggravated when weightbearing with and without shoegear. Pain is relieved with periodic professional debridement.  Chief Complaint  Patient presents with   foot care    Patient is here for diabetic foot care, last blood sugar 100,patient states that she does not know what her last A1c is at this time.Primary care is Glendon Axe last seen 1 month ago.   New problem(s): None.   PCP is Glendon Axe, MD.  Allergies  Allergen Reactions   Otho Darner Allergy] Shortness Of Breath and Swelling   Erythromycin Swelling and Rash   Hm Lidocaine Patch [Lidocaine]     Burning on skin   Erdafitinib     Other reaction(s): pt not sure   Fish Allergy     Swelling    Metoclopramide Other (See Comments)    Slurred speech and unable to move muscles,  Caused her to drag her feet   Sulfacetamide     Other reaction(s): itching, rash   Sulfacetamide Sodium-Sulfur Other (See Comments)    Other reaction(s): itching, rash   Doxycycline     GI Intolerance   Levaquin [Levofloxacin In D5w] Other (See Comments)    Causes irregular heart beat   Morphine Nausea And Vomiting and Rash    Irregular heart beat   Sulfa Antibiotics Rash    Review of Systems: Negative except as noted in the HPI.  Objective: No changes noted in today's physical examination.  Pamela Roberson is a pleasant 66 y.o. female in NAD. AAO x 3.  Vascular Examination: Vascular status intact b/l with palpable DP pulses b/l; nonpalpable PT pulses b/l. CFT immediate <3  seconds b/l. No edema. No pain with calf compression b/l. Skin temperature gradient WNL b/l. No cyanosis or clubbing noted b/l LE.  Neurological Examination: Pt has subjective symptoms of neuropathy. Protective sensation intact 5/5 intact bilaterally with 10g monofilament b/l. Vibratory sensation intact b/l.  Dermatological Examination: Pedal skin with normal turgor, texture and tone b/l. Toenails 1-5 b/l thick, discolored, elongated with subungual debris and pain on dorsal palpation.   Hyperkeratotic lesion(s) submet head 5 b/l and sub 5th met base right lower extremity.  No erythema, no edema, no drainage, no fluctuance.  Musculoskeletal Examination: Muscle strength 5/5 to b/l LE. 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  Radiographs: None Assessment/Plan: 1. Pain due to onychomycosis of toenails of both feet   2. Callus   3. Diabetic peripheral neuropathy associated with type 2 diabetes mellitus (HCC)     No orders of the defined types were placed in this encounter.   -Consent given for treatment as described below: -Medicaid ABN on file for paring of corn(s)/callus(es)/porokeratos(es). Copy in patient chart. -Patient to continue soft, supportive shoe gear daily. -Toenails 1-5 b/l were debrided in length and girth with sterile nail nippers and dremel without iatrogenic bleeding.  -Callus(es) submet head 5 b/l and sub 5th met base right lower extremity pared utilizing sterile scalpel blade without complication or incident. Total number debrided =3. -Patient/POA to call should there be question/concern in the interim.   Return  in about 3 months (around 01/25/2023).  Marzetta Board, DPM

## 2022-11-02 ENCOUNTER — Ambulatory Visit
Admission: EM | Admit: 2022-11-02 | Discharge: 2022-11-02 | Disposition: A | Discharge status: Home / Self Care | Payer: Medicare Other | Attending: Physician Assistant | Admitting: Physician Assistant

## 2022-11-02 DIAGNOSIS — J019 Acute sinusitis, unspecified: Secondary | ICD-10-CM | POA: Insufficient documentation

## 2022-11-02 DIAGNOSIS — Z1152 Encounter for screening for COVID-19: Secondary | ICD-10-CM | POA: Insufficient documentation

## 2022-11-02 DIAGNOSIS — J069 Acute upper respiratory infection, unspecified: Secondary | ICD-10-CM | POA: Insufficient documentation

## 2022-11-02 LAB — RESP PANEL BY RT-PCR (FLU A&B, COVID) ARPGX2
Influenza A by PCR: NEGATIVE
Influenza B by PCR: NEGATIVE
SARS Coronavirus 2 by RT PCR: NEGATIVE

## 2022-11-02 MED ORDER — CEPHALEXIN 250 MG PO CAPS: 250.0000 mg | ORAL_CAPSULE | Freq: Three times a day (TID) | ORAL | 0 refills | Status: AC

## 2022-11-02 NOTE — ED Triage Notes (Signed)
Pt presents with sinus pain, sore throat, colored nasal drainage, and headache for almost a week.

## 2022-11-02 NOTE — ED Provider Notes (Signed)
[facility]    CSN: [id number] Arrival date & time: 11/02/22  1612      History   Chief Complaint Chief Complaint  Patient presents with   Sore Throat   Headache   Facial Pain    HPI Pamela Roberson is a 66 y.o. female.   Patient here today for evaluation of sinus pain and pressure, sore throat, colored nasal congestion and drainage and headache that she has had for almost a week.  She reports that she has had productive cough with colored sputum as well.  She has had chills as well and has low-grade fever in office today.  Denies any vomiting or diarrhea.  She has tried over-the-counter medication without significant improvement.  The history is provided by the patient.  Sore Throat Associated symptoms include headaches. Pertinent negatives include no abdominal pain and no shortness of breath.  Headache Associated symptoms: congestion, cough, fever and sore throat   Associated symptoms: no abdominal pain, no diarrhea, no ear pain, no nausea and no vomiting     Past Medical History:  Diagnosis Date   Arthritis    Asthma    Carotid stenosis, bilateral    Carpal tunnel syndrome    Chronic low back pain    COPD (chronic obstructive pulmonary disease) ([place])    Diabetes mellitus without complication ([place])    type II    Diabetic neuropathy (HCC)    Diverticulitis    Dyspnea    mild    Frequent falls    GERD (gastroesophageal reflux disease)    History of bronchitis    Hypercholesterolemia    Hypertension    IBS (irritable bowel syndrome)    Osteoarthritis    knee   Palpitations    Right ventricular enlargement    per pt report   Sleep apnea    mild but no sleep apnea    TMJ (dislocation of temporomandibular joint)    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    Vitamin D deficiency     Patient Active Problem List   Diagnosis Date Noted   Pain in left knee 02/15/2022   Sciatica, left side 02/15/2022   Pain in left shoulder 11/16/2021    Internal hemorrhoids 11/03/2021   Leg cramps 11/03/2021   Myalgia 11/03/2021   Adenomatous polyp of colon 12/04/2020   Anal fissure 12/04/2020   Chronic idiopathic constipation 12/04/2020   Constipation 12/04/2020   Dysphagia 12/04/2020   Gastroesophageal reflux disease 12/04/2020   Rectal pain 12/04/2020   Right upper quadrant pain 12/04/2020   Dizzy 12/11/2017   Pharyngeal dysphagia 12/11/2017   Post-nasal drip 12/11/2017   Referred ear pain, bilateral 12/11/2017   Diabetes mellitus without complication ([place]) 40/34/7425   Family history of glaucoma 06/29/2017   Hyperopia of both eyes with astigmatism and presbyopia 06/29/2017   Keratoconjunctivitis sicca of both eyes not specified as Sjogren's 06/29/2017   Nuclear sclerotic cataract of both eyes 06/29/2017   Vitreous floater, bilateral 06/29/2017   Biceps tendinitis of right shoulder 04/05/2017   Impingement syndrome of right shoulder 04/05/2017   Arthritis of right acromioclavicular joint 04/05/2017   Partial nontraumatic tear of rotator cuff, right 04/05/2017    Past Surgical History:  Procedure Laterality Date   ABDOMINAL HYSTERECTOMY  1993   CARPAL TUNNEL RELEASE Right    CERVICAL [place] SURGERY  04/29/2015   Dr Dashanti Sis, [place] N/A 03/03/2021   Procedure: ESOPHAGEAL MANOMETRY (EM);  Surgeon: Michail Sermon,  Evette Doffing, MD;  Location: [facility];  Service: Endoscopy;  Laterality: N/A;   ESOPHAGOGASTRODUODENOSCOPY ENDOSCOPY     with esophagus being stretched twice per pt,    EYE SURGERY     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      Home Medications    Prior to Admission medications   Medication Sig Start Date End Date Taking? Authorizing Provider  cephALEXin (KEFLEX) 250 MG capsule Take 1  capsule (250 mg total) by mouth 3 (three) times daily for 7 days. 11/02/22 11/09/22 Yes Francene Finders, PA-C  acyclovir (ZOVIRAX) 400 MG tablet Take 400 mg by mouth 2 (two) times daily.    [provider]  ADVAIR [facility] 010-07 MCG/ACT inhaler Inhale 2 puffs into the lungs 2 (two) times daily. 06/10/22   Kennith Gain, MD  albuterol (PROAIR HFA) 108 (90 Base) MCG/ACT inhaler Inhale 1-2 puffs into the lungs every 6 (six) hours as needed for wheezing or shortness of breath. 06/14/20   Zigmund Gottron, NP  albuterol (PROVENTIL) (2.5 MG/3ML) 0.083% nebulizer solution Take 3 mLs (2.5 mg total) by nebulization every 4 (four) hours as needed for wheezing or shortness of breath. 03/11/19   Raylene Everts, MD  Alcohol Swabs (B-D SINGLE USE SWABS REGULAR) PADS Apply topically. 05/16/21   [provider]  Artificial Tear Ointment (DRY EYES OP) Place 1-2 drops into both eyes daily as needed (for dry eyes).     [provider]  ascorbic acid (VITAMIN C) 1000 MG tablet Take 1 tablet by mouth daily.    [provider]  atorvastatin (LIPITOR) 10 MG tablet Take 1 tablet by mouth daily.    [provider]  Blood Glucose Calibration (TRUE METRIX LEVEL 1) Low SOLN  09/20/21   [provider]  Blood Glucose Monitoring Suppl (TRUE METRIX METER) w/Device KIT  09/20/21   [provider]  cholecalciferol (VITAMIN D3) 25 MCG (1000 UNIT) tablet Take 2,000 Units by mouth daily.    [provider]  cycloSPORINE (RESTASIS) 0.05 % ophthalmic emulsion Place 1 drop into both eyes 2 times daily. 03/08/22   [provider]  EPINEPHrine 0.3 mg/0.3 mL IJ SOAJ injection INJECT ONE SYRINGE INTO THE MUSCLE AS NEEDED FOR ANAPHYLAXIS 08/24/22   Kennith Gain, MD  famotidine (PEPCID) 20 MG tablet Take 1 tablet (20 mg total) by mouth 2 (two) times daily. 05/12/22   Kennith Gain, MD  fluticasone (FLONASE) 50 MCG/ACT nasal spray Place 1  spray into both nostrils daily as needed for allergies or rhinitis.    [provider]  gabapentin (NEURONTIN) 300 MG capsule Take 300 mg by mouth 3 (three) times daily.    [provider]  JANUMET XR 50-500 MG TB24 Take 1 tablet by mouth daily. 09/27/20   [provider]  levocetirizine (XYZAL) 5 MG tablet Take 1 tablet (5 mg total) by mouth every evening. 03/23/22   Scot Jun, FNP  metoprolol succinate (TOPROL-XL) 25 MG 24 hr tablet Take by mouth.    [provider]  montelukast (SINGULAIR) 10 MG tablet Take 10 mg by mouth daily.     [provider]  Multiple Vitamin (MULTIVITAMIN  WITH MINERALS) TABS tablet Take 1 tablet by mouth daily.    [provider]  ondansetron (ZOFRAN) 4 MG tablet Take 4 mg by mouth every 8 (eight) hours as needed. 02/15/21   [provider]  pantoprazole (PROTONIX) 40 MG tablet Take 40 mg by mouth 2 (two) times daily.  11/20/19   [provider]  sucralfate (CARAFATE) 1 g tablet Take 1 g by mouth 4 (four) times daily.  02/11/20   [provider]  TRUE METRIX BLOOD GLUCOSE TEST test strip 1 each 3 (three) times daily. 09/20/21   [provider]  TRUEplus Lancets 33G MISC Apply 1 each topically 3 (three) times daily. 09/20/21   [provider]  esomeprazole (NEXIUM) 40 MG capsule Take 1 capsule (40 mg total) by mouth daily. Patient not taking: Reported on 07/08/2020 04/24/19 08/06/20  Charlesetta Shanks, MD    Family History Family History  Problem Relation Age of Onset   Allergic rhinitis Mother    Diabetes Father        type 2   Heart disease Father    Hypertension Father    Allergic rhinitis Father    Allergic rhinitis Sister    Collagen disease Sister    Heart disease Brother    Hypertension Brother    Angioedema Neg Hx    Asthma Neg Hx    Atopy Neg Hx    Eczema Neg Hx    Immunodeficiency Neg Hx    Urticaria Neg Hx     Social History Social History    Tobacco Use   Smoking status: Former    Packs/day: 0.50    Years: 30.00    Total pack years: 15.00    Types: Cigarettes    Quit date: 12/08/1994    Years since quitting: 27.9   Smokeless tobacco: Never  Vaping Use   Vaping Use: Never used  Substance Use Topics   Alcohol use: No   Drug use: No     Allergies   Crab [shellfish allergy], Erythromycin, Hm lidocaine patch [lidocaine], Erdafitinib, Fish allergy, Metoclopramide, Sulfacetamide, Sulfacetamide sodium-sulfur, Doxycycline, Levaquin [levofloxacin in d5w], Morphine, and Sulfa antibiotics   Review of Systems Review of Systems  Constitutional:  Positive for chills and fever.  HENT:  Positive for congestion and sore throat. Negative for ear pain.   Eyes:  Negative for discharge and redness.  Respiratory:  Positive for cough. Negative for shortness of breath and wheezing.   Gastrointestinal:  Negative for abdominal pain, diarrhea, nausea and vomiting.  Neurological:  Positive for headaches.     Physical Exam Triage Vital Signs ED Triage Vitals  Enc Vitals Group     BP 11/02/22 1632 (!) 146/85     Pulse Rate 11/02/22 1632 (!) 121     Resp 11/02/22 1632 17     Temp 11/02/22 1632 99.7 F (37.6 C)     Temp Source 11/02/22 1632 Oral     SpO2 11/02/22 1632 96 %     Weight --      Height --      Head Circumference --      Peak Flow --      Pain Score 11/02/22 1634 6     Pain Loc --      Pain Edu? --      Excl. in [place]? --    No data found.  Updated Vital Signs BP (!) 146/85 (BP Location: Left Arm)   Pulse (!) 121   Temp 99.7 F (37.6  C) (Oral)   Resp 17   SpO2 96%     Physical Exam Vitals and nursing note reviewed.  Constitutional:      General: She is not in acute distress.    Appearance: Normal appearance. She is not ill-appearing.  HENT:     Head: Normocephalic and atraumatic.     Nose: Congestion present.     Mouth/Throat:     Mouth: Mucous membranes are moist.     Pharynx: No oropharyngeal exudate  or posterior oropharyngeal erythema.  Eyes:     Conjunctiva/sclera: Conjunctivae normal.  Cardiovascular:     Rate and Rhythm: Normal rate and regular rhythm.     Heart sounds: Normal heart sounds. No murmur heard. Pulmonary:     Effort: Pulmonary effort is normal. No respiratory distress.     Breath sounds: Normal breath sounds. No wheezing, rhonchi or rales.  Skin:    General: Skin is warm and dry.  Neurological:     Mental Status: She is alert.  Psychiatric:        Mood and Affect: Mood normal.        Thought Content: Thought content normal.      UC Treatments / Results  Labs (all labs ordered are listed, but only abnormal results are displayed) Labs Reviewed  RESP PANEL BY RT-PCR (FLU A&B, COVID) ARPGX2    EKG   Radiology No results found.  Procedures Procedures (including critical care time)  Medications Ordered in UC Medications - No data to display  Initial Impression / Assessment and Plan / UC Course  I have reviewed the triage vital signs and the nursing notes.  Pertinent labs & imaging results that were available during my care of the patient were reviewed by me and considered in my medical decision making (see chart for details).    Will treat to cover sinusitis but will also screen for covid. Keflex prescribed given Augmentin prescriptions the last two months and multiple antibiotic allergies.  Encouraged follow up if symptoms do not improve or worsen in any way.   Final Clinical Impressions(s) / UC Diagnoses   Final diagnoses:  Acute upper respiratory infection  Encounter for screening for COVID-19  Acute sinusitis, recurrence not specified, unspecified location   Discharge Instructions   None    ED Prescriptions     Medication Sig Dispense Auth. Provider   cephALEXin (KEFLEX) 250 MG capsule Take 1 capsule (250 mg total) by mouth 3 (three) times daily for 7 days. 21 capsule Francene Finders, PA-C      PDMP not reviewed this encounter.    Francene Finders, PA-C 11/02/22 1828

## 2022-11-15 ENCOUNTER — Other Ambulatory Visit: Payer: Self-pay | Admitting: Allergy

## 2022-11-21 ENCOUNTER — Ambulatory Visit
Admission: EM | Admit: 2022-11-21 | Discharge: 2022-11-21 | Disposition: A | Discharge status: Home / Self Care | Payer: Medicare Other | Attending: Nurse Practitioner | Admitting: Nurse Practitioner

## 2022-11-21 DIAGNOSIS — Z1152 Encounter for screening for COVID-19: Secondary | ICD-10-CM | POA: Diagnosis not present

## 2022-11-21 DIAGNOSIS — B9789 Other viral agents as the cause of diseases classified elsewhere: Secondary | ICD-10-CM

## 2022-11-21 DIAGNOSIS — H66001 Acute suppurative otitis media without spontaneous rupture of ear drum, right ear: Secondary | ICD-10-CM | POA: Insufficient documentation

## 2022-11-21 DIAGNOSIS — J988 Other specified respiratory disorders: Secondary | ICD-10-CM | POA: Diagnosis not present

## 2022-11-21 DIAGNOSIS — Z7952 Long term (current) use of systemic steroids: Secondary | ICD-10-CM | POA: Insufficient documentation

## 2022-11-21 DIAGNOSIS — Z20828 Contact with and (suspected) exposure to other viral communicable diseases: Secondary | ICD-10-CM | POA: Diagnosis present

## 2022-11-21 DIAGNOSIS — B974 Respiratory syncytial virus as the cause of diseases classified elsewhere: Secondary | ICD-10-CM | POA: Insufficient documentation

## 2022-11-21 DIAGNOSIS — J4521 Mild intermittent asthma with (acute) exacerbation: Secondary | ICD-10-CM | POA: Diagnosis present

## 2022-11-21 DIAGNOSIS — Z79899 Other long term (current) drug therapy: Secondary | ICD-10-CM | POA: Diagnosis not present

## 2022-11-21 DIAGNOSIS — Z792 Long term (current) use of antibiotics: Secondary | ICD-10-CM | POA: Diagnosis not present

## 2022-11-21 LAB — RESP PANEL BY RT-PCR (RSV, FLU A&B, COVID)  RVPGX2
Influenza A by PCR: NEGATIVE
Influenza B by PCR: NEGATIVE
Resp Syncytial Virus by PCR: POSITIVE — AB
SARS Coronavirus 2 by RT PCR: NEGATIVE

## 2022-11-21 MED ORDER — PREDNISONE 20 MG PO TABS: 40.0000 mg | ORAL_TABLET | Freq: Every day | ORAL | 0 refills | Status: AC

## 2022-11-21 MED ORDER — AMOXICILLIN-POT CLAVULANATE 875-125 MG PO TABS: 1.0000 | ORAL_TABLET | Freq: Two times a day (BID) | ORAL | 0 refills | Status: AC

## 2022-11-21 MED ORDER — PROMETHAZINE-DM 6.25-15 MG/5ML PO SYRP: 2.5000 mL | ORAL_SOLUTION | Freq: Four times a day (QID) | ORAL | 0 refills | Status: DC | PRN

## 2022-11-21 NOTE — ED Provider Notes (Signed)
[facility]    CSN: [id number] Arrival date & time: 11/21/22  1556      History   Chief Complaint Chief Complaint  Patient presents with   Influenza    HPI Pamela Roberson is a 66 y.o. female who presents for evaluation of URI symptoms for 7-8 days. Patient reports associated symptoms of cough, fever, wheezing, sore throat, ear pain. Denies N/V/D, body aches. Patient does  have a hx of asthma.  Albuterol inhaler which she has been using with relief of her wheezing no known sick contacts and no recent travel. Pt is is vaccinated for COVID. Pt is is vaccinated for flu this season. Pt has taken NyQuil/DayQuil OTC for symptoms. Pt has no other concerns at this time.    Influenza Presenting symptoms: cough, fever and sore throat   Associated symptoms: ear pain and nasal congestion     Past Medical History:  Diagnosis Date   Arthritis    Asthma    Carotid stenosis, bilateral    Carpal tunnel syndrome    Chronic low back pain    COPD (chronic obstructive pulmonary disease) (HCC)    Diabetes mellitus without complication ([place])    type II    Diabetic neuropathy (HCC)    Diverticulitis    Dyspnea    mild    Frequent falls    GERD (gastroesophageal reflux disease)    History of bronchitis    Hypercholesterolemia    Hypertension    IBS (irritable bowel syndrome)    Osteoarthritis    knee   Palpitations    Right ventricular enlargement    per pt report   Sleep apnea    mild but no sleep apnea    TMJ (dislocation of temporomandibular joint)    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    Vitamin D deficiency     Patient Active Problem List   Diagnosis Date Noted   Pain in left knee 02/15/2022   Sciatica, left side 02/15/2022   Pain in left shoulder 11/16/2021   Internal hemorrhoids 11/03/2021   Leg cramps 11/03/2021   Myalgia 11/03/2021   Adenomatous polyp of colon 12/04/2020   Anal fissure 12/04/2020   Chronic idiopathic constipation 12/04/2020    Constipation 12/04/2020   Dysphagia 12/04/2020   Gastroesophageal reflux disease 12/04/2020   Rectal pain 12/04/2020   Right upper quadrant pain 12/04/2020   Dizzy 12/11/2017   Pharyngeal dysphagia 12/11/2017   Post-nasal drip 12/11/2017   Referred ear pain, bilateral 12/11/2017   Diabetes mellitus without complication ([place]) 44/81/8563   Family history of glaucoma 06/29/2017   Hyperopia of both eyes with astigmatism and presbyopia 06/29/2017   Keratoconjunctivitis sicca of both eyes not specified as Sjogren's 06/29/2017   Nuclear sclerotic cataract of both eyes 06/29/2017   Vitreous floater, bilateral 06/29/2017   Biceps tendinitis of right shoulder 04/05/2017   Impingement syndrome of right shoulder 04/05/2017   Arthritis of right acromioclavicular joint 04/05/2017   Partial nontraumatic tear of rotator cuff, right 04/05/2017    Past Surgical History:  Procedure Laterality Date   ABDOMINAL HYSTERECTOMY  1993   CARPAL TUNNEL RELEASE Right    CERVICAL [place] SURGERY  04/29/2015   Dr Roderick Sis, [place] N/A 03/03/2021   Procedure: ESOPHAGEAL MANOMETRY (EM);  Surgeon: Wilford Corner, MD;  Location: [facility];  Service: Endoscopy;  Laterality: N/A;   ESOPHAGOGASTRODUODENOSCOPY ENDOSCOPY     with esophagus being stretched twice per pt,  EYE SURGERY     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      Home Medications    Prior to Admission medications   Medication Sig Start Date End Date Taking? Authorizing Provider  amoxicillin-clavulanate (AUGMENTIN) 875-125 MG tablet Take 1 tablet by mouth every 12 (twelve) hours for 10 days. 11/21/22 12/01/22 Yes Melynda Ripple, NP  predniSONE (DELTASONE) 20 MG tablet Take 2 tablets (40 mg total) by mouth daily  with breakfast for 5 days. 11/21/22 11/26/22 Yes Melynda Ripple, NP  promethazine-dextromethorphan (PROMETHAZINE-DM) 6.25-15 MG/5ML syrup Take 2.5 mLs by mouth 4 (four) times daily as needed for cough. 11/21/22  Yes Melynda Ripple, NP  acyclovir (ZOVIRAX) 400 MG tablet Take 400 mg by mouth 2 (two) times daily.    [provider]  albuterol (PROAIR HFA) 108 (90 Base) MCG/ACT inhaler Inhale 1-2 puffs into the lungs every 6 (six) hours as needed for wheezing or shortness of breath. 06/14/20   Zigmund Gottron, NP  albuterol (PROVENTIL) (2.5 MG/3ML) 0.083% nebulizer solution Take 3 mLs (2.5 mg total) by nebulization every 4 (four) hours as needed for wheezing or shortness of breath. 03/11/19   Raylene Everts, MD  Alcohol Swabs (B-D SINGLE USE SWABS REGULAR) PADS Apply topically. 05/16/21   [provider]  Artificial Tear Ointment (DRY EYES OP) Place 1-2 drops into both eyes daily as needed (for dry eyes).     [provider]  ascorbic acid (VITAMIN C) 1000 MG tablet Take 1 tablet by mouth daily.    [provider]  atorvastatin (LIPITOR) 10 MG tablet Take 1 tablet by mouth daily.    [provider]  Blood Glucose Calibration (TRUE METRIX LEVEL 1) Low SOLN  09/20/21   [provider]  Blood Glucose Monitoring Suppl (TRUE METRIX METER) w/Device KIT  09/20/21   [provider]  cholecalciferol (VITAMIN D3) 25 MCG (1000 UNIT) tablet Take 2,000 Units by mouth daily.    [provider]  cycloSPORINE (RESTASIS) 0.05 % ophthalmic emulsion Place 1 drop into both eyes 2 times daily. 03/08/22   [provider]  EPINEPHrine 0.3 mg/0.3 mL IJ SOAJ injection INJECT ONE SYRINGE INTO THE MUSCLE AS NEEDED FOR ANAPHYLAXIS 08/24/22   Kennith Gain, MD  famotidine (PEPCID) 20 MG tablet Take 1 tablet (20 mg total) by mouth 2 (two) times daily. 05/12/22   Kennith Gain, MD  fluticasone (FLONASE) 50 MCG/ACT nasal spray Place 1  spray into both nostrils daily as needed for allergies or rhinitis.    [provider]  fluticasone-salmeterol (ADVAIR HFA) 833-38 MCG/ACT inhaler INHALE 2 PUFFS INTO THE LUNGS TWICE DAILY 11/15/22   Kennith Gain, MD  gabapentin (NEURONTIN) 300 MG capsule Take 300 mg by mouth 3 (three) times daily.    [provider]  JANUMET XR 50-500 MG TB24 Take 1 tablet by mouth daily. 09/27/20   [provider]  levocetirizine (XYZAL) 5 MG tablet Take 1 tablet (5 mg total) by mouth every evening. 03/23/22   Scot Jun, FNP  metoprolol succinate (TOPROL-XL) 25 MG 24 hr tablet Take by mouth.    [provider]  montelukast (SINGULAIR) 10  MG tablet Take 10 mg by mouth daily.     [provider]  Multiple Vitamin (MULTIVITAMIN WITH MINERALS) TABS tablet Take 1 tablet by mouth daily.    [provider]  ondansetron (ZOFRAN) 4 MG tablet Take 4 mg by mouth every 8 (eight) hours as needed. 02/15/21   [provider]  pantoprazole (PROTONIX) 40 MG tablet Take 40 mg by mouth 2 (two) times daily.  11/20/19   [provider]  sucralfate (CARAFATE) 1 g tablet Take 1 g by mouth 4 (four) times daily.  02/11/20   [provider]  TRUE METRIX BLOOD GLUCOSE TEST test strip 1 each 3 (three) times daily. 09/20/21   [provider]  TRUEplus Lancets 33G MISC Apply 1 each topically 3 (three) times daily. 09/20/21   [provider]  esomeprazole (NEXIUM) 40 MG capsule Take 1 capsule (40 mg total) by mouth daily. Patient not taking: Reported on 07/08/2020 04/24/19 08/06/20  Charlesetta Shanks, MD    Family History Family History  Problem Relation Age of Onset   Allergic rhinitis Mother    Diabetes Father        type 2   Heart disease Father    Hypertension Father    Allergic rhinitis Father    Allergic rhinitis Sister    Collagen disease Sister    Heart disease Brother    Hypertension Brother    Angioedema Neg Hx     Asthma Neg Hx    Atopy Neg Hx    Eczema Neg Hx    Immunodeficiency Neg Hx    Urticaria Neg Hx     Social History Social History   Tobacco Use   Smoking status: Former    Packs/day: 0.50    Years: 30.00    Total pack years: 15.00    Types: Cigarettes    Quit date: 12/08/1994    Years since quitting: 27.9   Smokeless tobacco: Never  Vaping Use   Vaping Use: Never used  Substance Use Topics   Alcohol use: No   Drug use: No     Allergies   Crab [shellfish allergy], Erythromycin, Hm lidocaine patch [lidocaine], Erdafitinib, Fish allergy, Metoclopramide, Sulfacetamide, Sulfacetamide sodium-sulfur, Doxycycline, Levaquin [levofloxacin in d5w], Morphine, and Sulfa antibiotics   Review of Systems Review of Systems  Constitutional:  Positive for fever.  HENT:  Positive for congestion, ear pain and sore throat.   Respiratory:  Positive for cough and wheezing.      Physical Exam Triage Vital Signs ED Triage Vitals  Enc Vitals Group     BP 11/21/22 1643 (!) 157/91     Pulse Rate 11/21/22 1643 100     Resp 11/21/22 1643 18     Temp 11/21/22 1643 98.2 F (36.8 C)     Temp Source 11/21/22 1643 Oral     SpO2 11/21/22 1643 93 %     Weight --      Height --      Head Circumference --      Peak Flow --      Pain Score 11/21/22 1559 4     Pain Loc --      Pain Edu? --      Excl. in [place]? --    No data found.  Updated Vital Signs BP (!) 157/91 (BP Location: Left Arm)   Pulse 100   Temp 98.2 F (36.8 C) (Oral)   Resp 18   SpO2 93%   Visual Acuity Right Eye Distance:  Left Eye Distance:   Bilateral Distance:    Right Eye Near:   Left Eye Near:    Bilateral Near:     Physical Exam Vitals and nursing note reviewed.  Constitutional:      General: She is not in acute distress.    Appearance: She is well-developed. She is not ill-appearing.  HENT:     Head: Normocephalic and atraumatic.     Right Ear: Ear canal normal. Tympanic membrane is erythematous.      Left Ear: Tympanic membrane and ear canal normal.     Nose: Congestion present.     Mouth/Throat:     Mouth: Mucous membranes are moist.     Pharynx: Oropharynx is clear. Uvula midline. Posterior oropharyngeal erythema present.     Tonsils: No tonsillar exudate or tonsillar abscesses.  Eyes:     Conjunctiva/sclera: Conjunctivae normal.     Pupils: Pupils are equal, round, and reactive to light.  Cardiovascular:     Rate and Rhythm: Normal rate and regular rhythm.     Heart sounds: Normal heart sounds.  Pulmonary:     Effort: Pulmonary effort is normal.     Breath sounds: Normal breath sounds.  Musculoskeletal:     Cervical back: Normal range of motion and neck supple.  Lymphadenopathy:     Cervical: No cervical adenopathy.  Skin:    General: Skin is warm and dry.  Neurological:     General: No focal deficit present.     Mental Status: She is alert and oriented to person, place, and time.  Psychiatric:        Mood and Affect: Mood normal.        Behavior: Behavior normal.      UC Treatments / Results  Labs (all labs ordered are listed, but only abnormal results are displayed) Labs Reviewed  RESP PANEL BY RT-PCR (RSV, FLU A&B, COVID)  RVPGX2    EKG   Radiology No results found.  Procedures Procedures (including critical care time)  Medications Ordered in UC Medications - No data to display  Initial Impression / Assessment and Plan / UC Course  I have reviewed the triage vital signs and the nursing notes.  Pertinent labs & imaging results that were available during my care of the patient were reviewed by me and considered in my medical decision making (see chart for details).     Reviewed exam and symptoms with patient Start Augmentin for right OM Prednisone for wheezing Cough syrup as needed Continue butyryl inhaler as needed COVID, flu, RSV PCR given patient exposure to RSV Rest and fluids PCP follow-up 2 to 3 days for recheck ER precautions reviewed  and patient verbalized understanding Final Clinical Impressions(s) / UC Diagnoses   Final diagnoses:  Exposure to respiratory syncytial virus (RSV)  Non-recurrent acute suppurative otitis media of right ear without spontaneous rupture of tympanic membrane  Mild intermittent asthma with acute exacerbation  Viral respiratory illness     Discharge Instructions      Augmentin twice daily for 10 days Prednisone daily for 5 days Cough syrup as needed Rest and fluids Continue your albuterol inhaler as needed Follow-up with your PCP 2 to 3 days for recheck Please go to the emergency room for any worsening symptoms   ED Prescriptions     Medication Sig Dispense Auth. Provider   amoxicillin-clavulanate (AUGMENTIN) 875-125 MG tablet Take 1 tablet by mouth every 12 (twelve) hours for 10 days. 20 tablet Melynda Ripple, NP  promethazine-dextromethorphan (PROMETHAZINE-DM) 6.25-15 MG/5ML syrup Take 2.5 mLs by mouth 4 (four) times daily as needed for cough. 118 mL Melynda Ripple, NP   predniSONE (DELTASONE) 20 MG tablet Take 2 tablets (40 mg total) by mouth daily with breakfast for 5 days. 10 tablet Melynda Ripple, NP      PDMP not reviewed this encounter.   Melynda Ripple, NP 11/21/22 1704

## 2022-11-21 NOTE — ED Triage Notes (Signed)
Pt presents with Cough, Chills, Vomiting for about a week; granddaughter was diagnosed with RSV last week.

## 2022-11-21 NOTE — Discharge Instructions (Signed)
Augmentin twice daily for 10 days Prednisone daily for 5 days Cough syrup as needed Rest and fluids Continue your albuterol inhaler as needed Follow-up with your PCP 2 to 3 days for recheck Please go to the emergency room for any worsening symptoms

## 2022-12-13 ENCOUNTER — Other Ambulatory Visit: Payer: Self-pay | Admitting: Allergy

## 2022-12-28 ENCOUNTER — Ambulatory Visit: Payer: 59 | Admitting: Allergy

## 2022-12-28 ENCOUNTER — Emergency Department (HOSPITAL_COMMUNITY)
Admission: EM | Admit: 2022-12-28 | Discharge: 2022-12-28 | Discharge status: Against Medical Advice | Payer: 59 | Attending: Emergency Medicine | Admitting: Emergency Medicine

## 2022-12-28 DIAGNOSIS — R112 Nausea with vomiting, unspecified: Secondary | ICD-10-CM | POA: Insufficient documentation

## 2022-12-28 DIAGNOSIS — Z5321 Procedure and treatment not carried out due to patient leaving prior to being seen by health care provider: Secondary | ICD-10-CM | POA: Diagnosis not present

## 2022-12-28 DIAGNOSIS — R1013 Epigastric pain: Secondary | ICD-10-CM | POA: Insufficient documentation

## 2022-12-28 NOTE — ED Provider Triage Note (Signed)
Emergency Medicine Provider Triage Evaluation Note  Pamela Roberson , a 67 y.o. female  was evaluated in triage.  Pt complains of abdominal pain.  Patient reports that she started to have pain in her flank yesterday. This morning the pain radiates to her upper abdomen and the epigastric area.  Pain is intermittent, feels like a burning sensation.  She has taken pantoprazole and Nexium with no relief.  Reports nausea and vomiting this morning.  No fever, chest pain, shortness of breath, bowel changes, urinary symptoms.  Review of Systems  Positive: As above Negative: As above  Physical Exam  BP (!) 146/86   Pulse 89   Temp 99 F (37.2 C) (Oral)   Resp 18   SpO2 98%  Gen:   Awake, no distress   Resp:  Normal effort  MSK:   Moves extremities without difficulty  Other:  TTP to epigastrium.  Medical Decision Making  Medically screening exam initiated at 12:44 PM.  Appropriate orders placed.  Pamela Roberson was informed that the remainder of the evaluation will be completed by another provider, this initial triage assessment does not replace that evaluation, and the importance of remaining in the ED until their evaluation is complete.     Rex Kras, [place] 12/28/22 1246

## 2022-12-28 NOTE — ED Triage Notes (Signed)
Pt c/o upper abdominal pain and emesis. Pt states pain feels "like burning" going up into her chest. Pt denies any SOB.

## 2023-01-05 ENCOUNTER — Ambulatory Visit
Admission: EM | Admit: 2023-01-05 | Discharge: 2023-01-05 | Disposition: A | Discharge status: Home / Self Care | Payer: 59 | Attending: Internal Medicine | Admitting: Internal Medicine

## 2023-01-05 DIAGNOSIS — B349 Viral infection, unspecified: Secondary | ICD-10-CM

## 2023-01-05 DIAGNOSIS — R112 Nausea with vomiting, unspecified: Secondary | ICD-10-CM | POA: Diagnosis not present

## 2023-01-05 MED ORDER — GUAIFENESIN 100 MG/5ML PO LIQD: 100.0000 mg | ORAL | 0 refills | Status: DC | PRN

## 2023-01-05 MED ORDER — METHYLPREDNISOLONE ACETATE 80 MG/ML IJ SUSP
80.0000 mg | Freq: Once | INTRAMUSCULAR | Status: AC
  Administered 2023-01-05: 80 mg via INTRAMUSCULAR

## 2023-01-05 MED ORDER — ONDANSETRON 4 MG PO TBDP: 4.0000 mg | ORAL_TABLET | Freq: Three times a day (TID) | ORAL | 0 refills | Status: DC | PRN

## 2023-01-05 NOTE — ED Triage Notes (Signed)
Went to the ER last week with vomiting and ear pain. Now having headache, nausea, and patient states her head is tender to the touch. That started Monday. Patient states she gets dizzy when she bends over. Also having pain in her ears. Taking tylenol to help with symptoms.

## 2023-01-05 NOTE — Discharge Instructions (Signed)
I have prescribed a cough medication and nausea medication.  Please ensure adequate fluid hydration and rest.  Follow-up if symptoms persist or worsen.  Please monitor blood sugars very closely over the next few days given that you received a steroid injection today.

## 2023-01-05 NOTE — ED Provider Notes (Addendum)
[facility]    CSN: [id number] Arrival date & time: 01/05/23  1634      History   Chief Complaint Chief Complaint  Patient presents with   Otalgia   Headache    HPI Pamela Roberson is a 67 y.o. female.   Patient presents with cough, headache, nasal congestion, bilateral ear pain, nausea with vomiting that started about a week ago.  Patient reports that she has been able to tolerate food and fluids.  Denies diarrhea.  Last bowel movement was 4-5 days ago but the patient reports this is baseline for her as she has IBS with constipation.  She typically has to take milk of magnesia for the symptoms to resolve. Denies abdominal pain.  Patient still has flatulence.  Denies chest pain, shortness of breath, blood in stool or emesis.  Denies any associated fever.  Reports her grandchild has had similar symptoms.  Headache is present throughout the head.  Patient does not report taking medications to alleviate symptoms.  Patient reports history of asthma and has been using albuterol inhaler with improvement. Went to the ED when symptoms first started on 12/28/2022 but left before being adequately evaluated.  She had EKG completed that was unremarkable.   Otalgia Headache   Past Medical History:  Diagnosis Date   Arthritis    Asthma    Carotid stenosis, bilateral    Carpal tunnel syndrome    Chronic low back pain    COPD (chronic obstructive pulmonary disease) (HCC)    Diabetes mellitus without complication (HCC)    type II    Diabetic neuropathy (HCC)    Diverticulitis    Dyspnea    mild    Frequent falls    GERD (gastroesophageal reflux disease)    History of bronchitis    Hypercholesterolemia    Hypertension    IBS (irritable bowel syndrome)    Osteoarthritis    knee   Palpitations    Right ventricular enlargement    per pt report   Sleep apnea    mild but no sleep apnea    TMJ (dislocation of temporomandibular joint)    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    Vitamin D deficiency     Patient Active Problem List   Diagnosis Date Noted   Pain in left knee 02/15/2022   Sciatica, left side 02/15/2022   Pain in left shoulder 11/16/2021   Internal hemorrhoids 11/03/2021   Leg cramps 11/03/2021   Myalgia 11/03/2021   Adenomatous polyp of colon 12/04/2020   Anal fissure 12/04/2020   Chronic idiopathic constipation 12/04/2020   Constipation 12/04/2020   Dysphagia 12/04/2020   Gastroesophageal reflux disease 12/04/2020   Rectal pain 12/04/2020   Right upper quadrant pain 12/04/2020   Dizzy 12/11/2017   Pharyngeal dysphagia 12/11/2017   Post-nasal drip 12/11/2017   Referred ear pain, bilateral 12/11/2017   Diabetes mellitus without complication ([place]) 52/84/1324   Family history of glaucoma 06/29/2017   Hyperopia of both eyes with astigmatism and presbyopia 06/29/2017   Keratoconjunctivitis sicca of both eyes not specified as Sjogren's 06/29/2017   Nuclear sclerotic cataract of both eyes 06/29/2017   Vitreous floater, bilateral 06/29/2017   Biceps tendinitis of right shoulder 04/05/2017   Impingement syndrome of right shoulder 04/05/2017   Arthritis of right acromioclavicular joint 04/05/2017   Partial nontraumatic tear of rotator cuff, right 04/05/2017    Past Surgical History:  Procedure Laterality Date   [place]  CARPAL TUNNEL RELEASE Right    CERVICAL DISC SURGERY  04/29/2015   Dr Emmanuela Sis, [place]   ESOPHAGEAL MANOMETRY N/A 03/03/2021   Procedure: ESOPHAGEAL MANOMETRY (EM);  Surgeon: Wilford Corner, MD;  Location: [facility];  Service: Endoscopy;  Laterality: N/A;   ESOPHAGOGASTRODUODENOSCOPY ENDOSCOPY     with esophagus being stretched twice per pt,    EYE SURGERY     fatty 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      feeding tube placement and removal   2015   NASAL SEPTUM SURGERY     ROTATOR CUFF REPAIR Right    spurs removed from esophagus   2015   TONSILLECTOMY      OB History   No  obstetric history on file.      Home Medications    Prior to Admission medications   Medication Sig Start Date End Date Taking? Authorizing Provider  acyclovir (ZOVIRAX) 400 MG tablet Take 400 mg by mouth 2 (two) times daily.   Yes [provider]  albuterol (PROAIR HFA) 108 (90 Base) MCG/ACT inhaler Inhale 1-2 puffs into the lungs every 6 (six) hours as needed for wheezing or shortness of breath. 06/14/20  Yes Burky, Lanelle Bal B, NP  albuterol (PROVENTIL) (2.5 MG/3ML) 0.083% nebulizer solution Take 3 mLs (2.5 mg total) by nebulization every 4 (four) hours as needed for wheezing or shortness of breath. 03/11/19  Yes Raylene Everts, MD  Alcohol Swabs (B-D SINGLE USE SWABS REGULAR) PADS Apply topically. 05/16/21  Yes [provider]  Artificial Tear Ointment (DRY EYES OP) Place 1-2 drops into both eyes daily as needed (for dry eyes).    Yes [provider]  ascorbic acid (VITAMIN C) 1000 MG tablet Take 1 tablet by mouth daily.   Yes [provider]  atorvastatin (LIPITOR) 10 MG tablet Take 1 tablet by mouth daily.   Yes [provider]  Blood Glucose Calibration (TRUE METRIX LEVEL 1) Low SOLN  09/20/21  Yes [provider]  Blood Glucose Monitoring Suppl (TRUE METRIX METER) w/Device KIT  09/20/21  Yes [provider]  cholecalciferol (VITAMIN D3) 25 MCG (1000 UNIT) tablet Take 2,000 Units by mouth daily.   Yes [provider]  famotidine (PEPCID) 20 MG tablet Take 1 tablet (20 mg total) by mouth 2 (two) times daily. 05/12/22  Yes Padgett, Rae Halsted, MD  fluticasone [facility]) 50 MCG/ACT nasal spray Place 1 spray into both nostrils daily as needed for allergies or rhinitis.   Yes [provider]  fluticasone-salmeterol (ADVAIR HFA) 115-21 MCG/ACT inhaler INHALE 2 PUFFS INTO THE LUNGS TWICE DAILY 11/15/22  Yes Padgett, Rae Halsted, MD  guaiFENesin (ROBITUSSIN) 100 MG/5ML liquid Take 5-10 mLs (100-200 mg total)  by mouth every 4 (four) hours as needed for cough or to loosen phlegm. 01/05/23  Yes Lillyanne Bradburn, Nuvia Hileman E, FNP  JANUMET XR 50-500 MG TB24 Take 1 tablet by mouth daily. 09/27/20  Yes [provider]  levocetirizine (XYZAL) 5 MG tablet Take 1 tablet (5 mg total) by mouth every evening. 03/23/22  Yes Scot Jun, NP  metoprolol succinate (TOPROL-XL) 25 MG 24 hr tablet Take by mouth.   Yes [provider]  montelukast (SINGULAIR) 10 MG tablet Take 10 mg by mouth daily.    Yes [provider]  Multiple Vitamin (MULTIVITAMIN WITH MINERALS) TABS tablet Take 1 tablet by mouth daily.   Yes [provider]  ondansetron (ZOFRAN-ODT) 4 MG disintegrating tablet Take 1 tablet (4 mg total) by mouth  every 8 (eight) hours as needed for nausea or vomiting. 01/05/23  Yes Kayron Hicklin, Hildred Alamin E, FNP  pantoprazole (PROTONIX) 40 MG tablet Take 40 mg by mouth 2 (two) times daily.  11/20/19  Yes [provider]  sucralfate (CARAFATE) 1 g tablet Take 1 g by mouth 4 (four) times daily.  02/11/20  Yes [provider]  TRUE METRIX BLOOD GLUCOSE TEST test strip 1 each 3 (three) times daily. 09/20/21  Yes [provider]  TRUEplus Lancets 33G MISC Apply 1 each topically 3 (three) times daily. 09/20/21  Yes [provider]  cycloSPORINE (RESTASIS) 0.05 % ophthalmic emulsion Place 1 drop into both eyes 2 times daily. 03/08/22   [provider]  EPINEPHrine 0.3 mg/0.3 mL IJ SOAJ injection INJECT ONE SYRINGE INTO THE MUSCLE AS NEEDED FOR ANAPHYLAXIS 08/24/22   Padgett, Rae Halsted, MD  gabapentin (NEURONTIN) 300 MG capsule Take 300 mg by mouth 3 (three) times daily.    [provider]  promethazine-dextromethorphan (PROMETHAZINE-DM) 6.25-15 MG/5ML syrup Take 2.5 mLs by mouth 4 (four) times daily as needed for cough. 11/21/22   Melynda Ripple, NP  esomeprazole (NEXIUM) 40 MG capsule Take 1 capsule (40 mg total) by mouth daily. Patient not taking: Reported on  07/08/2020 04/24/19 08/06/20  Charlesetta Shanks, MD    Family History Family History  Problem Relation Age of Onset   Allergic rhinitis Mother    Diabetes Father        type 2   Heart disease Father    Hypertension Father    Allergic rhinitis Father    Allergic rhinitis Sister    Collagen disease Sister    Heart disease Brother    Hypertension Brother    Angioedema Neg Hx    Asthma Neg Hx    Atopy Neg Hx    Eczema Neg Hx    Immunodeficiency Neg Hx    Urticaria Neg Hx     Social History Social History   Tobacco Use   Smoking status: Former    Packs/day: 0.50    Years: 30.00    Total pack years: 15.00    Types: Cigarettes    Quit date: 12/08/1994    Years since quitting: 28.0   Smokeless tobacco: Never  Vaping Use   Vaping Use: Never used  Substance Use Topics   Alcohol use: No   Drug use: No     Allergies   Crab [shellfish allergy], Erythromycin, Hm lidocaine patch [lidocaine], Erdafitinib, Fish allergy, Metoclopramide, Sulfacetamide, Sulfacetamide sodium-sulfur, Doxycycline, Levaquin [levofloxacin in d5w], Morphine, and Sulfa antibiotics   Review of Systems Review of Systems Per HPI  Physical Exam Triage Vital Signs ED Triage Vitals  Enc Vitals Group     BP 01/05/23 1656 (!) 156/86     Pulse Rate 01/05/23 1656 84     Resp 01/05/23 1656 18     Temp 01/05/23 1656 98.4 F (36.9 C)     Temp Source 01/05/23 1656 Oral     SpO2 01/05/23 1656 97 %     Weight --      Height --      Head Circumference --      Peak Flow --      Pain Score 01/05/23 1700 7     Pain Loc --      Pain Edu? --      Excl. in [place]? --    No data found.  Updated Vital Signs BP (!) 156/86 (BP Location: Right Arm)  Pulse 84   Temp 98.4 F (36.9 C) (Oral)   Resp 18   SpO2 97%   Visual Acuity Right Eye Distance:   Left Eye Distance:   Bilateral Distance:    Right Eye Near:   Left Eye Near:    Bilateral Near:     Physical Exam Constitutional:      General: She is not in acute  distress.    Appearance: Normal appearance. She is not toxic-appearing or diaphoretic.  HENT:     Head: Normocephalic and atraumatic.     Right Ear: Tympanic membrane and ear canal normal.     Left Ear: Tympanic membrane and ear canal normal.     Nose: Congestion present.     Mouth/Throat:     Mouth: Mucous membranes are moist.     Pharynx: No posterior oropharyngeal erythema.  Eyes:     Extraocular Movements: Extraocular movements intact.     Conjunctiva/sclera: Conjunctivae normal.     Pupils: Pupils are equal, round, and reactive to light.  Cardiovascular:     Rate and Rhythm: Normal rate and regular rhythm.     Pulses: Normal pulses.     Heart sounds: Normal heart sounds.  Pulmonary:     Effort: Pulmonary effort is normal. No respiratory distress.     Breath sounds: Normal breath sounds. No stridor. No wheezing, rhonchi or rales.  Abdominal:     General: Abdomen is flat. Bowel sounds are normal. There is no distension.     Palpations: Abdomen is soft.     Tenderness: There is no abdominal tenderness.  Musculoskeletal:        General: Normal range of motion.     Cervical back: Normal range of motion.  Skin:    General: Skin is warm and dry.  Neurological:     General: No focal deficit present.     Mental Status: She is alert and oriented to person, place, and time. Mental status is at baseline.     Cranial Nerves: Cranial nerves 2-12 are intact.     Sensory: Sensation is intact.     Motor: Motor function is intact.     Coordination: Coordination is intact.     Gait: Gait is intact.  Psychiatric:        Mood and Affect: Mood normal.        Behavior: Behavior normal.      UC Treatments / Results  Labs (all labs ordered are listed, but only abnormal results are displayed) Labs Reviewed - No data to display  EKG   Radiology No results found.  Procedures Procedures (including critical care time)  Medications Ordered in UC Medications  methylPREDNISolone  acetate (DEPO-MEDROL) injection 80 mg (80 mg Intramuscular Given 01/05/23 1733)    Initial Impression / Assessment and Plan / UC Course  I have reviewed the triage vital signs and the nursing notes.  Pertinent labs & imaging results that were available during my care of the patient were reviewed by me and considered in my medical decision making (see chart for details).     Symptoms and physical exam appear viral in nature especially given known sick contact.  There are no signs of acute abdomen or dehydration on exam so do not think that emergent evaluation is necessary.  Patient's headache is most likely related to viral illness.  Patient reports no bowel movement in a few days but reports this is baseline for her and she is still passing flatulence so do not  that imaging or emergent evaluation is necessary for this.  IM steroid administered due to duration of symptoms and patient's history of asthma in the setting of acute illness.  Advised her to continue albuterol as needed.  Patient has taken steroids before and tolerated well.  She states that her blood sugars do not typically increase significantly with steroids so this should be safe. Last EF appears normal.  Advised her to monitor blood sugars very closely over the next few days given steroid injection.  Will prescribe ondansetron to take as needed for nausea and vomiting.  Guaifenesin prescribed take as needed for cough.  There are no signs of bacterial infection on exam or need for antibiotic therapy at this time. Patient declined covid testing at this time.  Patient verbalized understanding and was agreeable with plan. Final Clinical Impressions(s) / UC Diagnoses   Final diagnoses:  Viral illness  Nausea and vomiting, unspecified vomiting type     Discharge Instructions      I have prescribed a cough medication and nausea medication.  Please ensure adequate fluid hydration and rest.  Follow-up if symptoms persist or worsen.  Please  monitor blood sugars very closely over the next few days given that you received a steroid injection today.    ED Prescriptions     Medication Sig Dispense Auth. Provider   ondansetron (ZOFRAN-ODT) 4 MG disintegrating tablet Take 1 tablet (4 mg total) by mouth every 8 (eight) hours as needed for nausea or vomiting. 20 tablet [place], [place] E, [place]   guaiFENesin (ROBITUSSIN) 100 MG/5ML liquid Take 5-10 mLs (100-200 mg total) by mouth every 4 (four) hours as needed for cough or to loosen phlegm. 60 mL Teodora Medici, [place]      PDMP not reviewed this encounter.   Teodora Medici, [place] 01/05/23 [place], [place], [place] 01/05/23 [phone]

## 2023-01-08 ENCOUNTER — Other Ambulatory Visit: Payer: Self-pay | Admitting: Allergy

## 2023-01-09 DIAGNOSIS — Z79899 Other long term (current) drug therapy: Secondary | ICD-10-CM | POA: Diagnosis not present

## 2023-01-11 ENCOUNTER — Other Ambulatory Visit: Payer: Self-pay | Admitting: Allergy

## 2023-01-12 ENCOUNTER — Ambulatory Visit (INDEPENDENT_AMBULATORY_CARE_PROVIDER_SITE_OTHER): Payer: 59 | Admitting: Allergy

## 2023-01-12 ENCOUNTER — Other Ambulatory Visit: Payer: Self-pay

## 2023-01-12 ENCOUNTER — Encounter: Payer: Self-pay | Admitting: Allergy

## 2023-01-12 VITALS — BP 150/70 | HR 77 | Temp 98.1°F | Resp 16 | Ht 66.0 in | Wt 212.1 lb

## 2023-01-12 DIAGNOSIS — T7800XD Anaphylactic reaction due to unspecified food, subsequent encounter: Secondary | ICD-10-CM

## 2023-01-12 DIAGNOSIS — H1013 Acute atopic conjunctivitis, bilateral: Secondary | ICD-10-CM

## 2023-01-12 DIAGNOSIS — J3089 Other allergic rhinitis: Secondary | ICD-10-CM

## 2023-01-12 DIAGNOSIS — K2100 Gastro-esophageal reflux disease with esophagitis, without bleeding: Secondary | ICD-10-CM

## 2023-01-12 DIAGNOSIS — B999 Unspecified infectious disease: Secondary | ICD-10-CM | POA: Diagnosis not present

## 2023-01-12 DIAGNOSIS — J4540 Moderate persistent asthma, uncomplicated: Secondary | ICD-10-CM | POA: Diagnosis not present

## 2023-01-12 MED ORDER — FAMOTIDINE 20 MG PO TABS: ORAL_TABLET | ORAL | 5 refills | Status: DC

## 2023-01-12 MED ORDER — PANTOPRAZOLE SODIUM 40 MG PO TBEC: 40.0000 mg | DELAYED_RELEASE_TABLET | Freq: Two times a day (BID) | ORAL | 5 refills | Status: DC

## 2023-01-12 MED ORDER — MONTELUKAST SODIUM 10 MG PO TABS: 10.0000 mg | ORAL_TABLET | Freq: Every day | ORAL | 5 refills | Status: DC

## 2023-01-12 MED ORDER — LEVOCETIRIZINE DIHYDROCHLORIDE 5 MG PO TABS: 5.0000 mg | ORAL_TABLET | Freq: Every evening | ORAL | 5 refills | Status: DC

## 2023-01-12 MED ORDER — ALBUTEROL SULFATE (2.5 MG/3ML) 0.083% IN NEBU: 2.5000 mg | INHALATION_SOLUTION | RESPIRATORY_TRACT | 1 refills | Status: DC | PRN

## 2023-01-12 MED ORDER — FLUTICASONE-SALMETEROL 115-21 MCG/ACT IN AERO: 2.0000 | INHALATION_SPRAY | Freq: Two times a day (BID) | RESPIRATORY_TRACT | 5 refills | Status: DC

## 2023-01-12 MED ORDER — ALBUTEROL SULFATE HFA 108 (90 BASE) MCG/ACT IN AERS: 1.0000 | INHALATION_SPRAY | Freq: Four times a day (QID) | RESPIRATORY_TRACT | 1 refills | Status: DC | PRN

## 2023-01-12 NOTE — Patient Instructions (Addendum)
-  continue avoidance measures for grass pollen and tobacco leaf -continue Xyzal 1/2 tab (up to 1 tab) daily.   -stop nasal Atrovent as does not seem effective -try nasal Ryaltris 2 sprays each nostril twice a day for runny or stuffy nose.  Let us know if this sample helps your nose symptoms better  -continue Singulair '10mg'$  daily at bedtime -continue Advair ([place]) 159mg/21 take 2 puffs twice a day.  -have access to albuterol inhaler 2 puffs or albuterol 1 vial via nebulizer every 4-6 hours as needed for cough/wheeze/shortness of breath/chest tightness.  May use 15-20 minutes prior to activity.   Monitor frequency of use.    -food allergy testing both skin and blood work is negative for fish and shellfish -recommend you schedule for food challenge to [facility] and then can schedule for shellfish challenge - have access to self-injectable epinephrine Epipen 0.'3mg'$  at all times - follow emergency action plan in case of allergic reaction  -continue lifestyle modifications for reflux control (like propping up head of bed and minimizing foods that cause heartburn) -continue pantoprazole twice a day  -continue Pepcid 20 mg 1-2 times a day   -continue your current medication avoidance  Follow-up in 4-6 months or sooner if needed

## 2023-01-12 NOTE — Progress Notes (Signed)
Follow-up Note  RE: Pamela Roberson MRN: [id number] DOB: [birth year] Date of Office Visit: 01/12/2023   History of present illness: Pamela Roberson is a 67 y.o. female presenting today for follow-up of allergic rhinitis with conjunctivitis, asthma, food allergy and GERD.  She was last seen in the office on 05/12/22 by myself.   She had a viral illness last week.  She had an appointment with me last week however before arrival she was having a lot of vomiting.  So she went to the ED with this.  She did not get hospitalized.    She states her allergies have been about the same.  She states soon she will be moving out of her home to a different place.  She states the roof is leaking and she is having a issue with the air conditioning.  She has been told she has water damage in the basement.  She also has had issues with roaches.  She is hoping changes places will be helpful with her allergy symptom control.   She states she is experiencing nasal congestion, drainage, itchy/watery eyes, sneezing and generalized itching.  She takes xyzal daily at this time.  She has taken Zyrtec, claritin and allegra.  Xyzal she states works the best of these options.  She is not interested in trying the carbinoxamine option.  The xyzal however does make her drowsy and she already takes a half tab.  She has nasal atrovent and states it doesn't work well enough.  Every month she reports haivng 1-2 nosebleeds. She does not use any nasal saline spray.   She still takes singulair daily.   She states she had sinus infection in Oct, Nov and Dec of last year and states she had to go to UC all those months for treatments.  She states she was told she has sinus infection as well as ear infection.  She recalls getting antibiotics.   When asked about her vaccine status she states her PCP is requesting records from another doctor to review her vaccine status.  She is not sure if she received a pneumococcal vaccine yet.    She does feel like her asthma is doing ok right now.  But states she has been using  albuterol about 4 times a week because her advair has been out over the past month for coughing and shortness of breath.   When she had advair she was using 2 puffs twice a day.  She states the addition of pepcid to the pantoprazole was helpful with her reflux control.   She continues to avoid seafood.  She has an Forensic scientist.   Review of systems: Review of Systems  Constitutional: Negative.   HENT:  Positive for congestion, postnasal drip and sneezing.   Eyes:  Positive for itching.  Respiratory:  Positive for cough and shortness of breath.   Cardiovascular: Negative.   Gastrointestinal: Negative.   Musculoskeletal: Negative.   Skin: Negative.   Allergic/Immunologic: Negative.   Neurological: Negative.      All other systems negative unless noted above in HPI  Past medical/social/surgical/family history have been reviewed and are unchanged unless specifically indicated below.  No changes  Medication List: Current Outpatient Medications  Medication Sig Dispense Refill   acyclovir (ZOVIRAX) 400 MG tablet Take 400 mg by mouth 2 (two) times daily.     Alcohol Swabs (B-D SINGLE USE SWABS REGULAR) PADS Apply topically.     Artificial Tear Ointment (DRY EYES OP) Place 1-2  drops into both eyes daily as needed (for dry eyes).      ascorbic acid (VITAMIN C) 1000 MG tablet Take 1 tablet by mouth daily.     atorvastatin (LIPITOR) 10 MG tablet Take 1 tablet by mouth daily.     Blood Glucose Calibration (TRUE METRIX LEVEL 1) Low SOLN      Blood Glucose Monitoring Suppl (TRUE METRIX METER) w/Device KIT      cholecalciferol (VITAMIN D3) 25 MCG (1000 UNIT) tablet Take 2,000 Units by mouth daily.     cycloSPORINE (RESTASIS) 0.05 % ophthalmic emulsion Place 1 drop into both eyes 2 times daily.     EPINEPHrine 0.3 mg/0.3 mL IJ SOAJ injection INJECT ONE SYRINGE INTO THE MUSCLE AS NEEDED FOR ANAPHYLAXIS 2 each  1   fluticasone (FLONASE) 50 MCG/ACT nasal spray Place 1 spray into both nostrils daily as needed for allergies or rhinitis.     JANUMET XR 50-500 MG TB24 Take 1 tablet by mouth daily.     metoprolol succinate (TOPROL-XL) 25 MG 24 hr tablet Take by mouth.     Multiple Vitamin (MULTIVITAMIN WITH MINERALS) TABS tablet Take 1 tablet by mouth daily.     ondansetron (ZOFRAN-ODT) 4 MG disintegrating tablet Take 1 tablet (4 mg total) by mouth every 8 (eight) hours as needed for nausea or vomiting. 20 tablet 0   promethazine-dextromethorphan (PROMETHAZINE-DM) 6.25-15 MG/5ML syrup Take 2.5 mLs by mouth 4 (four) times daily as needed for cough. 118 mL 0   sucralfate (CARAFATE) 1 g tablet Take 1 g by mouth 4 (four) times daily.      TRUE METRIX BLOOD GLUCOSE TEST test strip 1 each 3 (three) times daily.     TRUEplus Lancets 33G MISC Apply 1 each topically 3 (three) times daily.     albuterol (PROAIR HFA) 108 (90 Base) MCG/ACT inhaler Inhale 1-2 puffs into the lungs every 6 (six) hours as needed for wheezing or shortness of breath. 18 g 1   albuterol (PROVENTIL) (2.5 MG/3ML) 0.083% nebulizer solution Take 3 mLs (2.5 mg total) by nebulization every 4 (four) hours as needed for wheezing or shortness of breath. 75 mL 1   famotidine (PEPCID) 20 MG tablet 20 mg 1-2 times daily. 60 tablet 5   fluticasone-salmeterol (ADVAIR HFA) 115-21 MCG/ACT inhaler Inhale 2 puffs into the lungs 2 (two) times daily. 12 g 5   gabapentin (NEURONTIN) 300 MG capsule Take 300 mg by mouth 3 (three) times daily. (Patient not taking: Reported on 01/12/2023)     guaiFENesin (ROBITUSSIN) 100 MG/5ML liquid Take 5-10 mLs (100-200 mg total) by mouth every 4 (four) hours as needed for cough or to loosen phlegm. (Patient not taking: Reported on 01/12/2023) 60 mL 0   levocetirizine (XYZAL) 5 MG tablet Take 1 tablet (5 mg total) by mouth every evening. 30 tablet 5   montelukast (SINGULAIR) 10 MG tablet Take 1 tablet (10 mg total) by mouth daily. 30  tablet 5   pantoprazole (PROTONIX) 40 MG tablet Take 1 tablet (40 mg total) by mouth 2 (two) times daily. 60 tablet 5   No current facility-administered medications for this visit.     Known medication allergies: Allergies  Allergen Reactions   Crab [Shellfish Allergy] Shortness Of Breath and Swelling   Erythromycin Swelling and Rash   Hm Lidocaine Patch [Lidocaine]     Burning on skin   Erdafitinib     Other reaction(s): pt not sure   Fish Allergy     Swelling  Metoclopramide Other (See Comments)    Slurred speech and unable to move muscles,  Caused her to drag her feet   Sulfacetamide     Other reaction(s): itching, rash   Sulfacetamide Sodium-Sulfur Other (See Comments)    Other reaction(s): itching, rash   Doxycycline     GI Intolerance   Levaquin [Levofloxacin In D5w] Other (See Comments)    Causes irregular heart beat   Morphine Nausea And Vomiting and Rash    Irregular heart beat   Sulfa Antibiotics Rash     Physical examination: Blood pressure (!) 150/70, pulse 77, temperature 98.1 F (36.7 C), resp. rate 16, height '5\' 6"'$  (1.676 m), weight 212 lb 1.6 oz (96.2 kg), SpO2 100 %.  General: Alert, interactive, in no acute distress. HEENT: PERRLA, TMs pearly gray, turbinates moderately edematous with clear discharge, post-pharynx non erythematous. Neck: Supple without lymphadenopathy. Lungs: Clear to auscultation without wheezing, rhonchi or rales. {no increased work of breathing. CV: Normal S1, S2 without murmurs. Abdomen: Nondistended, nontender. Skin: Warm and dry, without lesions or rashes. Extremities:  No clubbing, cyanosis or edema. Neuro:   Grossly intact.  Diagnositics/Labs: None today   Assessment and plan: Allergic rhinitis with conjunctivitis  -continue avoidance measures for grass pollen and tobacco leaf -continue Xyzal 1/2 tab (up to 1 tab) daily.   -stop nasal Atrovent as does not seem effective -try nasal Ryaltris 2 sprays each nostril twice  a day for runny or stuffy nose.  Let us know if this sample helps your nose symptoms better  Mod persistent asthma -continue Singulair '10mg'$  daily at bedtime -continue Advair ([place]) 17mg/21 take 2 puffs twice a day.  -have access to albuterol inhaler 2 puffs or albuterol 1 vial via nebulizer every 4-6 hours as needed for cough/wheeze/shortness of breath/chest tightness.  May use 15-20 minutes prior to activity.   Monitor frequency of use.    Food allergy -food allergy testing both skin and blood work is negative for fish and shellfish -recommend you schedule for food challenge to [facility] and then can schedule for shellfish challenge - have access to self-injectable epinephrine Epipen 0.'3mg'$  at all times - follow emergency action plan in case of allergic reaction  GERD -continue lifestyle modifications for reflux control (like propping up head of bed and minimizing foods that cause heartburn) -continue pantoprazole twice a day  -continue Pepcid 20 mg 1-2 times a day   Recurrent infections -obtained immunocompetence work-up today -will see if she may benefit from a pneumococcal booster  -continue your current medication avoidance  Follow-up in 4-6 months or sooner if needed  I appreciate the opportunity to take part in Meiya's care. Please do not hesitate to contact me with questions.  Sincerely,   SPrudy Feeler MD [facility] [place]

## 2023-01-18 DIAGNOSIS — K219 Gastro-esophageal reflux disease without esophagitis: Secondary | ICD-10-CM | POA: Diagnosis not present

## 2023-01-18 DIAGNOSIS — E1159 Type 2 diabetes mellitus with other circulatory complications: Secondary | ICD-10-CM | POA: Diagnosis not present

## 2023-01-18 DIAGNOSIS — Z0289 Encounter for other administrative examinations: Secondary | ICD-10-CM | POA: Diagnosis not present

## 2023-01-18 DIAGNOSIS — E119 Type 2 diabetes mellitus without complications: Secondary | ICD-10-CM | POA: Diagnosis not present

## 2023-01-18 DIAGNOSIS — D849 Immunodeficiency, unspecified: Secondary | ICD-10-CM | POA: Diagnosis not present

## 2023-01-18 LAB — CBC WITH DIFFERENTIAL
Basophils Absolute: 0 10*3/uL (ref 0.0–0.2)
Basos: 1 %
EOS (ABSOLUTE): 0.1 10*3/uL (ref 0.0–0.4)
Eos: 1 %
Hematocrit: 42 % (ref 34.0–46.6)
Hemoglobin: 14 g/dL (ref 11.1–15.9)
Immature Grans (Abs): 0 10*3/uL (ref 0.0–0.1)
Immature Granulocytes: 0 %
Lymphocytes Absolute: 1.7 10*3/uL (ref 0.7–3.1)
Lymphs: 28 %
MCH: 29.2 pg (ref 26.6–33.0)
MCHC: 33.3 g/dL (ref 31.5–35.7)
MCV: 88 fL (ref 79–97)
Monocytes Absolute: 0.6 10*3/uL (ref 0.1–0.9)
Monocytes: 10 %
Neutrophils Absolute: 3.5 10*3/uL (ref 1.4–7.0)
Neutrophils: 60 %
RBC: 4.79 x10E6/uL (ref 3.77–5.28)
RDW: 12.9 % (ref 11.7–15.4)
WBC: 5.9 10*3/uL (ref 3.4–10.8)

## 2023-01-18 LAB — STREP PNEUMONIAE 23 SEROTYPES IGG
Pneumo Ab Type 1*: 1.8 ug/mL (ref >1.3)
Pneumo Ab Type 12 (12F)*: <0.1 ug/mL — ABNORMAL LOW (ref >1.3)
Pneumo Ab Type 14*: 14.7 ug/mL (ref >1.3)
Pneumo Ab Type 17 (17F)*: 0.2 ug/mL — ABNORMAL LOW (ref >1.3)
Pneumo Ab Type 19 (19F)*: 2.8 ug/mL (ref >1.3)
Pneumo Ab Type 2*: 1.7 ug/mL (ref >1.3)
Pneumo Ab Type 20*: 2.1 ug/mL (ref >1.3)
Pneumo Ab Type 22 (22F)*: 3.6 ug/mL (ref >1.3)
Pneumo Ab Type 23 (23F)*: 0.1 ug/mL — ABNORMAL LOW (ref >1.3)
Pneumo Ab Type 26 (6B)*: 0.1 ug/mL — ABNORMAL LOW (ref >1.3)
Pneumo Ab Type 3*: 0.9 ug/mL — ABNORMAL LOW (ref >1.3)
Pneumo Ab Type 34 (10A)*: 0.6 ug/mL — ABNORMAL LOW (ref >1.3)
Pneumo Ab Type 4*: 0.3 ug/mL — ABNORMAL LOW (ref >1.3)
Pneumo Ab Type 43 (11A)*: <0.1 ug/mL — ABNORMAL LOW (ref >1.3)
Pneumo Ab Type 5*: 1.7 ug/mL (ref >1.3)
Pneumo Ab Type 51 (7F)*: 1.1 ug/mL — ABNORMAL LOW (ref >1.3)
Pneumo Ab Type 54 (15B)*: 3.4 ug/mL (ref >1.3)
Pneumo Ab Type 56 (18C)*: 1.4 ug/mL (ref >1.3)
Pneumo Ab Type 57 (19A)*: 1.1 ug/mL — ABNORMAL LOW (ref >1.3)
Pneumo Ab Type 68 (9V)*: 0.5 ug/mL — ABNORMAL LOW (ref >1.3)
Pneumo Ab Type 70 (33F)*: 3.5 ug/mL (ref >1.3)
Pneumo Ab Type 8*: 0.7 ug/mL — ABNORMAL LOW (ref >1.3)
Pneumo Ab Type 9 (9N)*: 1.8 ug/mL (ref >1.3)

## 2023-01-18 LAB — DIPHTHERIA / TETANUS ANTIBODY PANEL
Diphtheria Ab: 0.4 IU/mL (ref <0.10)
Tetanus Ab, IgG: 5.01 IU/mL (ref <0.10)

## 2023-01-18 LAB — COMPLEMENT, TOTAL: Compl, Total (CH50): >60 U/mL (ref >41)

## 2023-01-18 LAB — IGG, IGA, IGM
IgA/Immunoglobulin A, Serum: 320 mg/dL (ref 87–352)
IgG (Immunoglobin G), Serum: 720 mg/dL (ref 586–1602)
IgM (Immunoglobulin M), Srm: 49 mg/dL (ref 26–217)

## 2023-01-23 ENCOUNTER — Ambulatory Visit
Admission: EM | Admit: 2023-01-23 | Discharge: 2023-01-23 | Disposition: A | Discharge status: Home / Self Care | Payer: 59 | Attending: Internal Medicine | Admitting: Internal Medicine

## 2023-01-23 DIAGNOSIS — B349 Viral infection, unspecified: Secondary | ICD-10-CM | POA: Diagnosis not present

## 2023-01-23 MED ORDER — METHYLPREDNISOLONE ACETATE 40 MG/ML IJ SUSP
40.0000 mg | Freq: Once | INTRAMUSCULAR | Status: AC
  Administered 2023-01-23: 40 mg via INTRAMUSCULAR

## 2023-01-23 NOTE — ED Triage Notes (Signed)
Pt c/o drainage from eyes, headaches, jaw pain, nausea, vomiting, chills, malaise, sore throat, cough   Onset ~ sometimes last week

## 2023-01-23 NOTE — ED Provider Notes (Signed)
[facility]    CSN: [id number] Arrival date & time: 01/23/23  1058      History   Chief Complaint Chief Complaint  Patient presents with   Generalized Body Aches    HPI CHONDRA BLOODSAW is a 67 y.o. female.   Patient presents with runny nose, cough, nausea, vomiting, body aches, chills, sore throat, sinus pressure.  All symptoms started about 7 days ago except for nausea and vomiting which started today.  Patient denies blood in stool or emesis and denies any associated diarrhea or abdominal pain.  Patient has taken over-the-counter cold and flu medications with minimal improvement in symptoms.  Patient reports history of asthma and is requesting IM steroid today to help alleviate symptoms as this typically resolves her symptoms.  She denies any known fevers at home.  Reports that her grandchildren have had similar symptoms recently.  She was last seen at the beginning of February, and those symptoms resolved and these are new symptoms per patient report.  Denies chest pain or shortness of breath.  Patient was recently seen by allergy and asthma specialist given history of recurrent upper respiratory infections recently.  She was told that she needs to have a pneumonia vaccine.     Past Medical History:  Diagnosis Date   Arthritis    Asthma    Carotid stenosis, bilateral    Carpal tunnel syndrome    Chronic low back pain    COPD (chronic obstructive pulmonary disease) (HCC)    Diabetes mellitus without complication (HCC)    type II    Diabetic neuropathy (HCC)    Diverticulitis    Dyspnea    mild    Frequent falls    GERD (gastroesophageal reflux disease)    History of bronchitis    Hypercholesterolemia    Hypertension    IBS (irritable bowel syndrome)    Osteoarthritis    knee   Palpitations    Right ventricular enlargement    per pt report   Sleep apnea    mild but no sleep apnea    TMJ (dislocation of temporomandibular joint)    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    Vitamin D deficiency     Patient Active Problem List   Diagnosis Date Noted   Pain in left knee 02/15/2022   Sciatica, left side 02/15/2022   Pain in left shoulder 11/16/2021   Internal hemorrhoids 11/03/2021   Leg cramps 11/03/2021   Myalgia 11/03/2021   Adenomatous polyp of colon 12/04/2020   Anal fissure 12/04/2020   Chronic idiopathic constipation 12/04/2020   Constipation 12/04/2020   Dysphagia 12/04/2020   Gastroesophageal reflux disease 12/04/2020   Rectal pain 12/04/2020   Right upper quadrant pain 12/04/2020   Dizzy 12/11/2017   Pharyngeal dysphagia 12/11/2017   Post-nasal drip 12/11/2017   Referred ear pain, bilateral 12/11/2017   Diabetes mellitus without complication ([place]) [id number]   Family history of glaucoma 06/29/2017   Hyperopia of both eyes with astigmatism and presbyopia 06/29/2017   Keratoconjunctivitis sicca of both eyes not specified as Sjogren's 06/29/2017   Nuclear sclerotic cataract of both eyes 06/29/2017   Vitreous floater, bilateral 06/29/2017   Biceps tendinitis of right shoulder 04/05/2017   Impingement syndrome of right shoulder 04/05/2017   Arthritis of right acromioclavicular joint 04/05/2017   Partial nontraumatic tear of rotator cuff, right 04/05/2017    Past Surgical History:  Procedure Laterality Date   [place]  TUNNEL RELEASE Right    CERVICAL DISC SURGERY  04/29/2015   Dr Jazzlyn Sis, [place]   ESOPHAGEAL MANOMETRY N/A 03/03/2021   Procedure: ESOPHAGEAL MANOMETRY (EM);  Surgeon: Wilford Corner, MD;  Location: [facility];  Service: Endoscopy;  Laterality: N/A;   ESOPHAGOGASTRODUODENOSCOPY ENDOSCOPY     with esophagus being stretched twice per pt,    EYE SURGERY     fatty 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      feeding tube placement and removal   2015   NASAL SEPTUM SURGERY     ROTATOR CUFF REPAIR Right    spurs removed from esophagus   2015   TONSILLECTOMY      OB  History   No obstetric history on file.      Home Medications    Prior to Admission medications   Medication Sig Start Date End Date Taking? Authorizing Provider  acyclovir (ZOVIRAX) 400 MG tablet Take 400 mg by mouth 2 (two) times daily.    [provider]  albuterol (PROAIR HFA) 108 (90 Base) MCG/ACT inhaler Inhale 1-2 puffs into the lungs every 6 (six) hours as needed for wheezing or shortness of breath. 01/12/23   Kennith Gain, MD  albuterol (PROVENTIL) (2.5 MG/3ML) 0.083% nebulizer solution Take 3 mLs (2.5 mg total) by nebulization every 4 (four) hours as needed for wheezing or shortness of breath. 01/12/23   Kennith Gain, MD  Alcohol Swabs (B-D SINGLE USE SWABS REGULAR) PADS Apply topically. 05/16/21   [provider]  Artificial Tear Ointment (DRY EYES OP) Place 1-2 drops into both eyes daily as needed (for dry eyes).     [provider]  ascorbic acid (VITAMIN C) 1000 MG tablet Take 1 tablet by mouth daily.    [provider]  atorvastatin (LIPITOR) 10 MG tablet Take 1 tablet by mouth daily.    [provider]  Blood Glucose Calibration (TRUE METRIX LEVEL 1) Low SOLN  09/20/21   [provider]  Blood Glucose Monitoring Suppl (TRUE METRIX METER) w/Device KIT  09/20/21   [provider]  cholecalciferol (VITAMIN D3) 25 MCG (1000 UNIT) tablet Take 2,000 Units by mouth daily.    [provider]  cycloSPORINE (RESTASIS) 0.05 % ophthalmic emulsion Place 1 drop into both eyes 2 times daily. 03/08/22   [provider]  EPINEPHrine 0.3 mg/0.3 mL IJ SOAJ injection INJECT ONE SYRINGE INTO THE MUSCLE AS NEEDED FOR ANAPHYLAXIS 08/24/22   Kennith Gain, MD  famotidine (PEPCID) 20 MG tablet 20 mg 1-2 times daily. 01/12/23   Kennith Gain, MD  fluticasone (FLONASE) 50 MCG/ACT nasal spray Place 1 spray into both nostrils daily as needed for allergies or rhinitis.    [provider]  fluticasone-salmeterol (ADVAIR HFA) 115-21 MCG/ACT inhaler Inhale 2 puffs into the lungs 2 (two) times daily. 01/12/23   Kennith Gain, MD  gabapentin (NEURONTIN) 300 MG capsule Take 300 mg by mouth 3 (three) times daily. Patient not taking: Reported on 01/12/2023    [provider]  guaiFENesin (ROBITUSSIN) 100 MG/5ML liquid Take 5-10 mLs (100-200 mg total) by mouth every 4 (four) hours as needed for cough or to loosen phlegm. Patient not taking: Reported on 01/12/2023 01/05/23   Teodora Medici, FNP  JANUMET XR 50-500 MG TB24 Take 1 tablet by mouth daily. 09/27/20   [provider]  levocetirizine (XYZAL) 5 MG tablet Take 1 tablet (5 mg total) by mouth every evening. 01/12/23   Prudy Feeler  Mardene Celeste, MD  metoprolol succinate (TOPROL-XL) 25 MG 24 hr tablet Take by mouth.    [provider]  montelukast (SINGULAIR) 10 MG tablet Take 1 tablet (10 mg total) by mouth daily. 01/12/23   Kennith Gain, MD  Multiple Vitamin (MULTIVITAMIN WITH MINERALS) TABS tablet Take 1 tablet by mouth daily.    [provider]  ondansetron (ZOFRAN-ODT) 4 MG disintegrating tablet Take 1 tablet (4 mg total) by mouth every 8 (eight) hours as needed for nausea or vomiting. 01/05/23   Teodora Medici, FNP  pantoprazole (PROTONIX) 40 MG tablet Take 1 tablet (40 mg total) by mouth 2 (two) times daily. 01/12/23   Kennith Gain, MD  promethazine-dextromethorphan (PROMETHAZINE-DM) 6.25-15 MG/5ML syrup Take 2.5 mLs by mouth 4 (four) times daily as needed for cough. 11/21/22   Melynda Ripple, NP  sucralfate (CARAFATE) 1 g tablet Take 1 g by mouth 4 (four) times daily.  02/11/20   [provider]  TRUE METRIX BLOOD GLUCOSE TEST test strip 1 each 3 (three) times daily. 09/20/21   [provider]  TRUEplus Lancets 33G MISC Apply 1 each topically 3 (three) times daily. 09/20/21   [provider]  esomeprazole (NEXIUM) 40 MG capsule Take 1  capsule (40 mg total) by mouth daily. Patient not taking: Reported on 07/08/2020 04/24/19 08/06/20  Charlesetta Shanks, MD    Family History Family History  Problem Relation Age of Onset   Allergic rhinitis Mother    Diabetes Father        type 2   Heart disease Father    Hypertension Father    Allergic rhinitis Father    Allergic rhinitis Sister    Collagen disease Sister    Heart disease Brother    Hypertension Brother    Angioedema Neg Hx    Asthma Neg Hx    Atopy Neg Hx    Eczema Neg Hx    Immunodeficiency Neg Hx    Urticaria Neg Hx     Social History Social History   Tobacco Use   Smoking status: Former    Packs/day: 0.50    Years: 30.00    Total pack years: 15.00    Types: Cigarettes    Quit date: 12/08/1994    Years since quitting: 28.1   Smokeless tobacco: Never  Vaping Use   Vaping Use: Never used  Substance Use Topics   Alcohol use: No   Drug use: No     Allergies   Crab [shellfish allergy], Erythromycin, Hm lidocaine patch [lidocaine], Erdafitinib, Fish allergy, Metoclopramide, Sulfacetamide, Sulfacetamide sodium-sulfur, Doxycycline, Levaquin [levofloxacin in d5w], Morphine, and Sulfa antibiotics   Review of Systems Review of Systems Per HPI  Physical Exam Triage Vital Signs ED Triage Vitals [01/23/23 1218]  Enc Vitals Group     BP (!) 150/90     Pulse Rate 86     Resp 18     Temp 98.6 F (37 C)     Temp Source Oral     SpO2 96 %     Weight      Height      Head Circumference      Peak Flow      Pain Score 7     Pain Loc      Pain Edu?      Excl. in [place]?    No data found.  Updated Vital Signs BP (!) 150/90 (BP Location: Left Arm)   Pulse 86   Temp 98.6 F (  37 C) (Oral)   Resp 18   SpO2 96%   Visual Acuity Right Eye Distance:   Left Eye Distance:   Bilateral Distance:    Right Eye Near:   Left Eye Near:    Bilateral Near:     Physical Exam Constitutional:      General: She is not in acute distress.    Appearance: Normal  appearance. She is not toxic-appearing or diaphoretic.  HENT:     Head: Normocephalic and atraumatic.     Right Ear: Tympanic membrane and ear canal normal.     Left Ear: Tympanic membrane and ear canal normal.     Nose: Congestion present.     Mouth/Throat:     Mouth: Mucous membranes are moist.     Pharynx: No posterior oropharyngeal erythema.  Eyes:     Extraocular Movements: Extraocular movements intact.     Conjunctiva/sclera: Conjunctivae normal.     Pupils: Pupils are equal, round, and reactive to light.  Cardiovascular:     Rate and Rhythm: Normal rate and regular rhythm.     Pulses: Normal pulses.     Heart sounds: Normal heart sounds.  Pulmonary:     Effort: Pulmonary effort is normal. No respiratory distress.     Breath sounds: Normal breath sounds. No stridor. No wheezing, rhonchi or rales.  Abdominal:     General: Abdomen is flat. Bowel sounds are normal.     Palpations: Abdomen is soft.  Musculoskeletal:        General: Normal range of motion.     Cervical back: Normal range of motion.  Skin:    General: Skin is warm and dry.  Neurological:     General: No focal deficit present.     Mental Status: She is alert and oriented to person, place, and time. Mental status is at baseline.  Psychiatric:        Mood and Affect: Mood normal.        Behavior: Behavior normal.      UC Treatments / Results  Labs (all labs ordered are listed, but only abnormal results are displayed) Labs Reviewed - No data to display  EKG   Radiology No results found.  Procedures Procedures (including critical care time)  Medications Ordered in UC Medications  methylPREDNISolone acetate (DEPO-MEDROL) injection 40 mg (40 mg Intramuscular Given 01/23/23 1240)    Initial Impression / Assessment and Plan / UC Course  I have reviewed the triage vital signs and the nursing notes.  Pertinent labs & imaging results that were available during my care of the patient were reviewed by me  and considered in my medical decision making (see chart for details).     Patient presents with symptoms likely from a viral upper respiratory infection.  Do not suspect underlying cardiopulmonary process. Symptoms seem unlikely related to ACS, CHF or COPD exacerbations, pneumonia, pneumothorax. Patient is nontoxic appearing and not in need of emergent medical intervention.  Do not think viral testing is necessary given duration of symptoms as it would not change treatment.  There are no signs of acute abdomen or dehydration on exam so do not think that emergent evaluation is necessary due to this.  No need for strep test given appearance of posterior pharynx on exam.  Patient requesting IM steroid and given that it has been approximately 20 days since last steroid, do think this is reasonable.  Last EF was normal so this should be safe.  Patient has taken steroids before  and tolerated well.  Recommended symptom control and supportive care as well.  Return if symptoms fail to improve. Patient states understanding and is agreeable.  Discharged with PCP followup.  Final Clinical Impressions(s) / UC Diagnoses   Final diagnoses:  Viral illness     Discharge Instructions      You have a viral illness.  You were given a steroid shot today in urgent care to alleviate symptoms.  Ensure adequate fluid hydration and rest.  Follow-up if any symptoms persist or worsen.    ED Prescriptions   None    PDMP not reviewed this encounter.   Teodora Medici, [place] 01/23/23 1253

## 2023-01-23 NOTE — Discharge Instructions (Signed)
You have a viral illness.  You were given a steroid shot today in urgent care to alleviate symptoms.  Ensure adequate fluid hydration and rest.  Follow-up if any symptoms persist or worsen.

## 2023-01-25 ENCOUNTER — Telehealth: Payer: Self-pay | Admitting: Allergy

## 2023-01-25 NOTE — Telephone Encounter (Signed)
Pamela Roberson called back and spoke with the patient and advised of her lab results.

## 2023-01-25 NOTE — Telephone Encounter (Signed)
Patent  was returning a call about labs [phone].

## 2023-02-02 ENCOUNTER — Ambulatory Visit: Payer: 59

## 2023-02-08 ENCOUNTER — Ambulatory Visit: Payer: 59 | Admitting: Podiatry

## 2023-02-21 ENCOUNTER — Encounter (HOSPITAL_COMMUNITY): Payer: Self-pay

## 2023-02-21 ENCOUNTER — Ambulatory Visit (HOSPITAL_COMMUNITY)
Admission: EM | Admit: 2023-02-21 | Discharge: 2023-02-21 | Disposition: A | Discharge status: Home / Self Care | Payer: 59 | Attending: Family Medicine | Admitting: Family Medicine

## 2023-02-21 DIAGNOSIS — S61411A Laceration without foreign body of right hand, initial encounter: Secondary | ICD-10-CM | POA: Diagnosis not present

## 2023-02-21 MED ORDER — MUPIROCIN 2 % EX OINT: 1.0000 | TOPICAL_OINTMENT | Freq: Two times a day (BID) | CUTANEOUS | 0 refills | Status: DC

## 2023-02-21 MED ORDER — CEPHALEXIN 250 MG PO CAPS: 250.0000 mg | ORAL_CAPSULE | Freq: Three times a day (TID) | ORAL | 0 refills | Status: AC

## 2023-02-21 NOTE — ED Triage Notes (Signed)
Pt is here for cut on right hand x 1day

## 2023-02-21 NOTE — Discharge Instructions (Signed)
Take cephalexin 250 mg--1 capsule 3 times daily for 7 days  Put mupirocin ointment on the sore areas twice daily until improved

## 2023-02-21 NOTE — ED Provider Notes (Signed)
[place]    CSN: [id number] Arrival date & time: 02/21/23  1607      History   Chief Complaint Chief Complaint  Patient presents with   Hand Problem    HPI Pamela Roberson is a 67 y.o. female.   HPI Here for a cut on her right palm.  Yesterday about 5 PM, or about 24 hours ago, she was cleaning some fish when she stabbed her right palm with the knife she was using.  She is seeking attention today because it has been stinging and she is felt some tingling up into her right middle finger.  She is diabetic and her sugars are not usually well-controlled by her report.  She is allergic to sulfa and doxycycline and Levaquin and erythromycin. Last tetanus was in 2020, found on chart review in epic.   Past Medical History:  Diagnosis Date   Arthritis    Asthma    Carotid stenosis, bilateral    Carpal tunnel syndrome    Chronic low back pain    COPD (chronic obstructive pulmonary disease) (HCC)    Diabetes mellitus without complication (HCC)    type II    Diabetic neuropathy (HCC)    Diverticulitis    Dyspnea    mild    Frequent falls    GERD (gastroesophageal reflux disease)    History of bronchitis    Hypercholesterolemia    Hypertension    IBS (irritable bowel syndrome)    Osteoarthritis    knee   Palpitations    Right ventricular enlargement    per pt report   Sleep apnea    mild but no sleep apnea    TMJ (dislocation of temporomandibular joint)    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    Vitamin D deficiency     Patient Active Problem List   Diagnosis Date Noted   Pain in left knee 02/15/2022   Sciatica, left side 02/15/2022   Pain in left shoulder 11/16/2021   Internal hemorrhoids 11/03/2021   Leg cramps 11/03/2021   Myalgia 11/03/2021   Adenomatous polyp of colon 12/04/2020   Anal fissure 12/04/2020   Chronic idiopathic constipation 12/04/2020   Constipation 12/04/2020   Dysphagia 12/04/2020   Gastroesophageal reflux  disease 12/04/2020   Rectal pain 12/04/2020   Right upper quadrant pain 12/04/2020   Dizzy 12/11/2017   Pharyngeal dysphagia 12/11/2017   Post-nasal drip 12/11/2017   Referred ear pain, bilateral 12/11/2017   Diabetes mellitus without complication ([place]) [id number]   Family history of glaucoma 06/29/2017   Hyperopia of both eyes with astigmatism and presbyopia 06/29/2017   Keratoconjunctivitis sicca of both eyes not specified as Sjogren's 06/29/2017   Nuclear sclerotic cataract of both eyes 06/29/2017   Vitreous floater, bilateral 06/29/2017   Biceps tendinitis of right shoulder 04/05/2017   Impingement syndrome of right shoulder 04/05/2017   Arthritis of right acromioclavicular joint 04/05/2017   Partial nontraumatic tear of rotator cuff, right 04/05/2017    Past Surgical History:  Procedure Laterality Date   ABDOMINAL HYSTERECTOMY  1993   CARPAL TUNNEL RELEASE Right    CERVICAL [place] SURGERY  04/29/2015   Dr Iyonnah Sis, [place] N/A 03/03/2021   Procedure: ESOPHAGEAL MANOMETRY (EM);  Surgeon: Wilford Corner, MD;  Location: [facility];  Service: Endoscopy;  Laterality: N/A;   ESOPHAGOGASTRODUODENOSCOPY ENDOSCOPY     with esophagus being stretched twice per pt,    EYE SURGERY  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      Home Medications    Prior to Admission medications   Medication Sig Start Date End Date Taking? Authorizing Provider  albuterol (PROAIR HFA) 108 (90 Base) MCG/ACT inhaler Inhale 1-2 puffs into the lungs every 6 (six) hours as needed for wheezing or shortness of breath. 01/12/23  Yes Padgett, Rae Halsted, MD  albuterol (PROVENTIL) (2.5 MG/3ML) 0.083% nebulizer solution Take 3 mLs (2.5 mg total) by nebulization every 4 (four)  hours as needed for wheezing or shortness of breath. 01/12/23  Yes Padgett, Rae Halsted, MD  Artificial Tear Ointment (DRY EYES OP) Place 1-2 drops into both eyes daily as needed (for dry eyes).    Yes [provider]  ascorbic acid (VITAMIN C) 1000 MG tablet Take 1 tablet by mouth daily.   Yes [provider]  atorvastatin (LIPITOR) 10 MG tablet Take 1 tablet by mouth daily.   Yes [provider]  Blood Glucose Calibration (TRUE METRIX LEVEL 1) Low SOLN  09/20/21  Yes [provider]  Blood Glucose Monitoring Suppl (TRUE METRIX METER) w/Device KIT  09/20/21  Yes [provider]  cephALEXin (KEFLEX) 250 MG capsule Take 1 capsule (250 mg total) by mouth 3 (three) times daily for 7 days. 02/21/23 02/28/23 Yes Barrett Henle, MD  cholecalciferol (VITAMIN D3) 25 MCG (1000 UNIT) tablet Take 2,000 Units by mouth daily.   Yes [provider]  cycloSPORINE (RESTASIS) 0.05 % ophthalmic emulsion Place 1 drop into both eyes 2 times daily. 03/08/22  Yes [provider]  famotidine (PEPCID) 20 MG tablet 20 mg 1-2 times daily. 01/12/23  Yes Padgett, Rae Halsted, MD  fluticasone [facility]) 50 MCG/ACT nasal spray Place 1 spray into both nostrils daily as needed for allergies or rhinitis.   Yes [provider]  fluticasone-salmeterol (ADVAIR HFA) 115-21 MCG/ACT inhaler Inhale 2 puffs into the lungs 2 (two) times daily. 01/12/23  Yes Padgett, Rae Halsted, MD  JANUMET XR 50-500 MG TB24 Take 1 tablet by mouth daily. 09/27/20  Yes [provider]  levocetirizine (XYZAL) 5 MG tablet Take 1 tablet (5 mg total) by mouth every evening. 01/12/23  Yes Padgett, Rae Halsted, MD  metoprolol succinate (TOPROL-XL) 25 MG 24 hr tablet Take by mouth.   Yes [provider]  montelukast (SINGULAIR) 10 MG tablet Take 1 tablet (10 mg total) by mouth daily. 01/12/23  Yes Padgett, Rae Halsted, MD  Multiple Vitamin (MULTIVITAMIN WITH  MINERALS) TABS tablet Take 1 tablet by mouth daily.   Yes [provider]  mupirocin ointment (BACTROBAN) 2 % Apply 1 Application topically 2 (two) times daily. To affected area till better 02/21/23  Yes Prajwal Fellner, Gwenlyn Perking, MD  ondansetron (ZOFRAN-ODT) 4 MG disintegrating tablet Take 1 tablet (4 mg total) by mouth every 8 (eight) hours as needed for nausea or vomiting. 01/05/23  Yes [place], Hildred Alamin E, FNP  pantoprazole (PROTONIX) 40 MG tablet Take 1 tablet (40 mg total) by mouth 2 (two) times daily. 01/12/23  Yes Padgett, Rae Halsted, MD  sucralfate (CARAFATE) 1 g tablet Take 1 g by mouth 4 (four) times daily.  02/11/20  Yes [provider]  TRUE  METRIX BLOOD GLUCOSE TEST test strip 1 each 3 (three) times daily. 09/20/21  Yes [provider]  TRUEplus Lancets 33G MISC Apply 1 each topically 3 (three) times daily. 09/20/21  Yes [provider]  acyclovir (ZOVIRAX) 400 MG tablet Take 400 mg by mouth 2 (two) times daily.    [provider]  EPINEPHrine 0.3 mg/0.3 mL IJ SOAJ injection INJECT ONE SYRINGE INTO THE MUSCLE AS NEEDED FOR ANAPHYLAXIS 08/24/22   Padgett, Rae Halsted, MD  gabapentin (NEURONTIN) 300 MG capsule Take 300 mg by mouth 3 (three) times daily. Patient not taking: Reported on 01/12/2023    [provider]  guaiFENesin (ROBITUSSIN) 100 MG/5ML liquid Take 5-10 mLs (100-200 mg total) by mouth every 4 (four) hours as needed for cough or to loosen phlegm. Patient not taking: Reported on 01/12/2023 01/05/23   Teodora Medici, FNP  esomeprazole (NEXIUM) 40 MG capsule Take 1 capsule (40 mg total) by mouth daily. Patient not taking: Reported on 07/08/2020 04/24/19 08/06/20  Charlesetta Shanks, MD    Family History Family History  Problem Relation Age of Onset   Allergic rhinitis Mother    Diabetes Father        type 2   Heart disease Father    Hypertension Father    Allergic rhinitis Father    Allergic rhinitis Sister    Collagen disease Sister     Heart disease Brother    Hypertension Brother    Angioedema Neg Hx    Asthma Neg Hx    Atopy Neg Hx    Eczema Neg Hx    Immunodeficiency Neg Hx    Urticaria Neg Hx     Social History Social History   Tobacco Use   Smoking status: Former    Packs/day: 0.50    Years: 30.00    Additional pack years: 0.00    Total pack years: 15.00    Types: Cigarettes    Quit date: 12/08/1994    Years since quitting: 28.2   Smokeless tobacco: Never  Vaping Use   Vaping Use: Never used  Substance Use Topics   Alcohol use: No   Drug use: No     Allergies   Crab [shellfish allergy], Erythromycin, Hm lidocaine patch [lidocaine], Erdafitinib, Fish allergy, Metoclopramide, Sulfacetamide, Sulfacetamide sodium-sulfur, Doxycycline, Levaquin [levofloxacin in d5w], Morphine, and Sulfa antibiotics   Review of Systems Review of Systems   Physical Exam Triage Vital Signs ED Triage Vitals  Enc Vitals Group     BP 02/21/23 1631 122/84     Pulse Rate 02/21/23 1631 91     Resp 02/21/23 1631 12     Temp 02/21/23 1631 98 F (36.7 C)     Temp src --      SpO2 02/21/23 1631 98 %     Weight --      Height --      Head Circumference --      Peak Flow --      Pain Score 02/21/23 1634 0     Pain Loc --      Pain Edu? --      Excl. in [place]? --    No data found.  Updated Vital Signs BP 122/84 (BP Location: Left Arm)   Pulse 91   Temp 98 F (36.7 C)   Resp 12   SpO2 98%   Visual Acuity Right Eye Distance:   Left Eye Distance:   Bilateral Distance:    Right Eye Near:   Left  Eye Near:    Bilateral Near:     Physical Exam Vitals reviewed.  Constitutional:      General: She is not in acute distress.    Appearance: She is not toxic-appearing.  Skin:    Coloration: Skin is not pale.     Comments: There is 1/2 cm long laceration that is fairly shallow in her right palm.  The skin edges do just barely gape.  There is no surrounding erythema or induration at this time.  There is no purulent  drainage.  There is full range of motion about all the joints of the right hand.  Capillary refill distally is normal.  There is no swelling or palm.  Neurological:     Mental Status: She is alert and oriented to person, place, and time.  Psychiatric:        Behavior: Behavior normal.      UC Treatments / Results  Labs (all labs ordered are listed, but only abnormal results are displayed) Labs Reviewed - No data to display  EKG   Radiology No results found.  Procedures Procedures (including critical care time)  Medications Ordered in UC Medications - No data to display  Initial Impression / Assessment and Plan / UC Course  I have reviewed the triage vital signs and the nursing notes.  Pertinent labs & imaging results that were available during my care of the patient were reviewed by me and considered in my medical decision making (see chart for details).        I am going to send in Keflex for 5 days for prevention of infection since she is a diabetic.  Also mupirocin is sent in for her to use topically.  Is too late to consider suturing the laceration as it is 24 hours post injury.  I also not sure that it would have needed suturing if and if she had come in earlier. Final Clinical Impressions(s) / UC Diagnoses   Final diagnoses:  Laceration of right palm, initial encounter     Discharge Instructions      Take cephalexin 250 mg--1 capsule 3 times daily for 7 days  Put mupirocin ointment on the sore areas twice daily until improved      ED Prescriptions     Medication Sig Dispense Auth. Provider   cephALEXin (KEFLEX) 250 MG capsule Take 1 capsule (250 mg total) by mouth 3 (three) times daily for 7 days. 21 capsule Barrett Henle, MD   mupirocin ointment (BACTROBAN) 2 % Apply 1 Application topically 2 (two) times daily. To affected area till better 22 g Windy Carina Gwenlyn Perking, MD      PDMP not reviewed this encounter.   Barrett Henle,  MD 02/21/23 [phone]

## 2023-02-24 ENCOUNTER — Telehealth: Payer: Self-pay | Admitting: Allergy

## 2023-02-24 MED ORDER — ALBUTEROL SULFATE (2.5 MG/3ML) 0.083% IN NEBU: 2.5000 mg | INHALATION_SOLUTION | RESPIRATORY_TRACT | 1 refills | Status: DC | PRN

## 2023-02-24 MED ORDER — AZELASTINE HCL 0.05 % OP SOLN: 1.0000 [drp] | Freq: Two times a day (BID) | OPHTHALMIC | 5 refills | Status: DC

## 2023-02-24 MED ORDER — CARBINOXAMINE MALEATE 4 MG PO TABS: 1.0000 | ORAL_TABLET | Freq: Two times a day (BID) | ORAL | 1 refills | Status: DC

## 2023-02-24 NOTE — Telephone Encounter (Signed)
Patient states she had an allergy flare last night.  her symptoms are itching, mucus, coughing and congestion. Patient request a call back to get relief of symptoms.

## 2023-02-24 NOTE — Telephone Encounter (Addendum)
Returned patient call - No DPR on file - LMOVM to contact regarding below notation.  I will send patient a myChart message as well.

## 2023-02-24 NOTE — Telephone Encounter (Addendum)
Per  provider:  If xyzal is not effective then can try Carbinoxamine 4mg  twice a day Can see if Bepreve or Optivar 1 drop each eye twice a day as needed for itchy/watery eyes is covered Continue Ryaltris nasal spray as directed Continue Singulair as directed. Utilize albuterol as needed use.  Continue Advair as directed. Can/Is she performing nasal saline rinses? If not would add this to regimen Agree with at-home Covid testing  Called patient - DOB verified - advisied of above provider notation.  Patient verbalized understanding, no further questions.

## 2023-02-24 NOTE — Addendum Note (Signed)
Addended by: Tommas Olp B on: 02/24/2023 03:48 PM   Modules accepted: Orders

## 2023-02-24 NOTE — Telephone Encounter (Signed)
Patient returned call - DOB/Pharmacy verified - stated she started having the following symptoms  x 2-3 dys ago: headache, dry cough, some shortness of breath, itchy eyes and itching all over since the pollen has come out.  Patient states she has been following all of the provider's directions/instructions from 01/12/23 office visit with little relief. Patient states Xyzal does not work either - request something else.  Patient stated grandchildren have been sick - coughing, fever. Patient asked to take Home Covid test as well - Patient will purchase one from pharmacy and take one.  Patient advised message would be forwarded to provider for next step.  Patient verbalized understanding, no further questions.

## 2023-02-26 ENCOUNTER — Other Ambulatory Visit: Payer: Self-pay | Admitting: Allergy

## 2023-02-27 ENCOUNTER — Telehealth: Payer: Self-pay

## 2023-02-27 ENCOUNTER — Other Ambulatory Visit (HOSPITAL_COMMUNITY): Payer: Self-pay

## 2023-02-27 NOTE — Telephone Encounter (Signed)
PA request received via patients pharmacy for Carbinoxamine Maleate 4MG  tablets  PA has been submitted via CMM to [employer] and is pending determination.  Key: [id number]

## 2023-02-28 NOTE — Telephone Encounter (Signed)
PA has been APPROVED from 02/27/2023-12/05/2023

## 2023-03-03 ENCOUNTER — Encounter (HOSPITAL_COMMUNITY): Payer: Self-pay

## 2023-03-03 ENCOUNTER — Ambulatory Visit (INDEPENDENT_AMBULATORY_CARE_PROVIDER_SITE_OTHER): Payer: 59

## 2023-03-03 ENCOUNTER — Ambulatory Visit (HOSPITAL_COMMUNITY)
Admission: EM | Admit: 2023-03-03 | Discharge: 2023-03-03 | Disposition: A | Discharge status: Home / Self Care | Payer: 59 | Attending: Emergency Medicine | Admitting: Emergency Medicine

## 2023-03-03 DIAGNOSIS — R197 Diarrhea, unspecified: Secondary | ICD-10-CM | POA: Diagnosis not present

## 2023-03-03 DIAGNOSIS — J441 Chronic obstructive pulmonary disease with (acute) exacerbation: Secondary | ICD-10-CM

## 2023-03-03 DIAGNOSIS — R059 Cough, unspecified: Secondary | ICD-10-CM

## 2023-03-03 MED ORDER — AMOXICILLIN-POT CLAVULANATE 875-125 MG PO TABS: 1.0000 | ORAL_TABLET | Freq: Two times a day (BID) | ORAL | 0 refills | Status: DC

## 2023-03-03 MED ORDER — ALBUTEROL SULFATE (2.5 MG/3ML) 0.083% IN NEBU
2.5000 mg | INHALATION_SOLUTION | Freq: Once | RESPIRATORY_TRACT | Status: AC
  Administered 2023-03-03: 2.5 mg via RESPIRATORY_TRACT

## 2023-03-03 MED ORDER — ACETAMINOPHEN 325 MG PO TABS
975.0000 mg | ORAL_TABLET | Freq: Once | ORAL | Status: AC
  Administered 2023-03-03: 975 mg via ORAL

## 2023-03-03 MED ORDER — ONDANSETRON 4 MG PO TBDP: 4.0000 mg | ORAL_TABLET | Freq: Three times a day (TID) | ORAL | 0 refills | Status: DC | PRN

## 2023-03-03 MED ORDER — PROMETHAZINE-DM 6.25-15 MG/5ML PO SYRP: 5.0000 mL | ORAL_SOLUTION | Freq: Three times a day (TID) | ORAL | 0 refills | Status: DC | PRN

## 2023-03-03 MED ORDER — ONDANSETRON 4 MG PO TBDP
4.0000 mg | ORAL_TABLET | Freq: Once | ORAL | Status: AC
  Administered 2023-03-03: 4 mg via ORAL

## 2023-03-03 MED ORDER — ONDANSETRON 4 MG PO TBDP
ORAL_TABLET | ORAL | Status: AC
  Filled 2023-03-03: qty 1

## 2023-03-03 MED ORDER — ALBUTEROL SULFATE (2.5 MG/3ML) 0.083% IN NEBU
INHALATION_SOLUTION | RESPIRATORY_TRACT | Status: AC
  Filled 2023-03-03: qty 3

## 2023-03-03 MED ORDER — ACETAMINOPHEN 325 MG PO TABS
ORAL_TABLET | ORAL | Status: AC
  Filled 2023-03-03: qty 3

## 2023-03-03 NOTE — Discharge Instructions (Addendum)
I am starting you on antibiotics to cover you for a COPD exacerbation.  Please take your nebulizer solutions every 6 hours as needed for cough and wheezing.  You can use the Zofran as needed for nausea and vomiting every 8 hours.  Please use the cough syrup up to 3 times daily as needed for coughing, do not drink or drive on this medication as it may make you drowsy.  Please use a bland diet for your diarrhea.  You can alternate between Tylenol and ibuprofen as needed for fever and bodyaches.  Please return to clinic or seek immediate care if you develop worsening of shortness of breath, chest pain, and no improvement of symptoms despite antibiotics.

## 2023-03-03 NOTE — ED Provider Notes (Signed)
[name]    CSN: [id number] Arrival date & time: 03/03/23  1456      History   Chief Complaint Chief Complaint  Patient presents with   Cough   Wheezing   Diarrhea   Chills    HPI Pamela Roberson is a 67 y.o. female.   Patient presents to clinic for 5 days of nonproductive cough, and 'rattling in her chest' and reports hx of PNA in around 2021. She has had hot and cold chills, felt fatigued and flushed since Sunday. Reprts decreased appetite. Diarrhea twice since today as well as nausea. No emesis. Denies blood in stool.   Denies recent sick contacts. Granddaughter was brought here last week, was given an inhaler. Reports a hx of asthma, medical records state COPD.  Patient reports using her last breathing treatment last night.       The history is provided by the patient.  Cough Associated symptoms: chills, fever, shortness of breath and wheezing   Associated symptoms: no chest pain, no eye discharge, no headaches and no sore throat   Wheezing Associated symptoms: cough, fatigue, fever and shortness of breath   Associated symptoms: no chest pain, no headaches and no sore throat   Diarrhea Associated symptoms: abdominal pain, chills and fever   Associated symptoms: no headaches and no vomiting     Past Medical History:  Diagnosis Date   Arthritis    Asthma    Carotid stenosis, bilateral    Carpal tunnel syndrome    Chronic low back pain    COPD (chronic obstructive pulmonary disease) (HCC)    Diabetes mellitus without complication ([place])    type II    Diabetic neuropathy (HCC)    Diverticulitis    Dyspnea    mild    Frequent falls    GERD (gastroesophageal reflux disease)    History of bronchitis    Hypercholesterolemia    Hypertension    IBS (irritable bowel syndrome)    Osteoarthritis    knee   Palpitations    Right ventricular enlargement    per pt report   Sleep apnea    mild but no sleep apnea    TMJ (dislocation of  temporomandibular joint)    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    Vitamin D deficiency     Patient Active Problem List   Diagnosis Date Noted   Pain in left knee 02/15/2022   Sciatica, left side 02/15/2022   Pain in left shoulder 11/16/2021   Internal hemorrhoids 11/03/2021   Leg cramps 11/03/2021   Myalgia 11/03/2021   Adenomatous polyp of colon 12/04/2020   Anal fissure 12/04/2020   Chronic idiopathic constipation 12/04/2020   Constipation 12/04/2020   Dysphagia 12/04/2020   Gastroesophageal reflux disease 12/04/2020   Rectal pain 12/04/2020   Right upper quadrant pain 12/04/2020   Dizzy 12/11/2017   Pharyngeal dysphagia 12/11/2017   Post-nasal drip 12/11/2017   Referred ear pain, bilateral 12/11/2017   Diabetes mellitus without complication ([place]) [id number]   Family history of glaucoma 06/29/2017   Hyperopia of both eyes with astigmatism and presbyopia 06/29/2017   Keratoconjunctivitis sicca of both eyes not specified as Sjogren's 06/29/2017   Nuclear sclerotic cataract of both eyes 06/29/2017   Vitreous floater, bilateral 06/29/2017   Biceps tendinitis of right shoulder 04/05/2017   Impingement syndrome of right shoulder 04/05/2017   Arthritis of right acromioclavicular joint 04/05/2017   Partial nontraumatic tear of rotator cuff, right 04/05/2017  Past Surgical History:  Procedure Laterality Date   ABDOMINAL HYSTERECTOMY  1993   CARPAL TUNNEL RELEASE Right    CERVICAL [place] SURGERY  04/29/2015   Dr Danali Sis, [place] N/A 03/03/2021   Procedure: ESOPHAGEAL MANOMETRY (EM);  Surgeon: Wilford Corner, MD;  Location: [facility];  Service: Endoscopy;  Laterality: N/A;   ESOPHAGOGASTRODUODENOSCOPY ENDOSCOPY     with esophagus being stretched twice per pt,    EYE SURGERY     fatty 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      feeding tube placement and removal   2015   NASAL SEPTUM SURGERY     ROTATOR CUFF REPAIR Right    spurs removed from  esophagus   2015   TONSILLECTOMY      OB History   No obstetric history on file.      Home Medications    Prior to Admission medications   Medication Sig Start Date End Date Taking? Authorizing Provider  amoxicillin-clavulanate (AUGMENTIN) 875-125 MG tablet Take 1 tablet by mouth every 12 (twelve) hours. 03/03/23  Yes Louretta Shorten, [place], FNP  ondansetron (ZOFRAN-ODT) 4 MG disintegrating tablet Take 1 tablet (4 mg total) by mouth every 8 (eight) hours as needed for nausea or vomiting. 03/03/23  Yes Louretta Shorten, [place], FNP  promethazine-dextromethorphan (PROMETHAZINE-DM) 6.25-15 MG/5ML syrup Take 5 mLs by mouth 3 (three) times daily as needed for cough. 03/03/23  Yes Louretta Shorten, [place], FNP  acyclovir (ZOVIRAX) 400 MG tablet Take 400 mg by mouth 2 (two) times daily.    [provider]  ADVAIR [facility] [phone] MCG/ACT inhaler INHALE 2 PUFFS INTO THE LUNGS TWICE DAILY 02/27/23   Kennith Gain, MD  albuterol (PROAIR HFA) 108 (90 Base) MCG/ACT inhaler Inhale 1-2 puffs into the lungs every 6 (six) hours as needed for wheezing or shortness of breath. 01/12/23   Kennith Gain, MD  albuterol (PROVENTIL) (2.5 MG/3ML) 0.083% nebulizer solution Take 3 mLs (2.5 mg total) by nebulization every 4 (four) hours as needed for wheezing or shortness of breath. 02/24/23   Kennith Gain, MD  Artificial Tear Ointment (DRY EYES OP) Place 1-2 drops into both eyes daily as needed (for dry eyes).     [provider]  ascorbic acid (VITAMIN C) 1000 MG tablet Take 1 tablet by mouth daily.    [provider]  atorvastatin (LIPITOR) 10 MG tablet Take 1 tablet by mouth daily.    [provider]  azelastine (OPTIVAR) 0.05 % ophthalmic solution Place 1 drop into both eyes 2 (two) times daily. 02/24/23   Kennith Gain, MD  Blood Glucose Calibration (TRUE METRIX LEVEL 1) Low SOLN  09/20/21   [provider]  Blood Glucose Monitoring Suppl (TRUE  METRIX METER) w/Device KIT  09/20/21   [provider]  Carbinoxamine Maleate 4 MG TABS Take 1 tablet (4 mg total) by mouth 2 (two) times daily. 02/24/23   Kennith Gain, MD  cholecalciferol (VITAMIN D3) 25 MCG (1000 UNIT) tablet Take 2,000 Units by mouth daily.    [provider]  cycloSPORINE (RESTASIS) 0.05 % ophthalmic emulsion Place 1 drop into both eyes 2 times daily. 03/08/22   [provider]  EPINEPHrine 0.3 mg/0.3 mL IJ SOAJ injection INJECT ONE SYRINGE INTO THE MUSCLE AS NEEDED FOR ANAPHYLAXIS 08/24/22   Kennith Gain, MD  famotidine (PEPCID) 20 MG tablet 20 mg 1-2 times daily. 01/12/23   Kennith Gain, MD  fluticasone Asencion Islam) 50 MCG/ACT nasal  spray Place 1 spray into both nostrils daily as needed for allergies or rhinitis.    [provider]  gabapentin (NEURONTIN) 300 MG capsule Take 300 mg by mouth 3 (three) times daily. Patient not taking: Reported on 01/12/2023    [provider]  guaiFENesin (ROBITUSSIN) 100 MG/5ML liquid Take 5-10 mLs (100-200 mg total) by mouth every 4 (four) hours as needed for cough or to loosen phlegm. Patient not taking: Reported on 01/12/2023 01/05/23   Teodora Medici, FNP  JANUMET XR 50-500 MG TB24 Take 1 tablet by mouth daily. 09/27/20   [provider]  levocetirizine (XYZAL) 5 MG tablet Take 1 tablet (5 mg total) by mouth every evening. 01/12/23   Kennith Gain, MD  metoprolol succinate (TOPROL-XL) 25 MG 24 hr tablet Take by mouth.    [provider]  montelukast (SINGULAIR) 10 MG tablet Take 1 tablet (10 mg total) by mouth daily. 01/12/23   Kennith Gain, MD  Multiple Vitamin (MULTIVITAMIN WITH MINERALS) TABS tablet Take 1 tablet by mouth daily.    [provider]  mupirocin ointment (BACTROBAN) 2 % Apply 1 Application topically 2 (two) times daily. To affected area till better 02/21/23   Barrett Henle, MD  pantoprazole (PROTONIX) 40  MG tablet Take 1 tablet (40 mg total) by mouth 2 (two) times daily. 01/12/23   Kennith Gain, MD  sucralfate (CARAFATE) 1 g tablet Take 1 g by mouth 4 (four) times daily.  02/11/20   [provider]  TRUE METRIX BLOOD GLUCOSE TEST test strip 1 each 3 (three) times daily. 09/20/21   [provider]  TRUEplus Lancets 33G MISC Apply 1 each topically 3 (three) times daily. 09/20/21   [provider]  esomeprazole (NEXIUM) 40 MG capsule Take 1 capsule (40 mg total) by mouth daily. Patient not taking: Reported on 07/08/2020 04/24/19 08/06/20  Charlesetta Shanks, MD    Family History Family History  Problem Relation Age of Onset   Allergic rhinitis Mother    Diabetes Father        type 2   Heart disease Father    Hypertension Father    Allergic rhinitis Father    Allergic rhinitis Sister    Collagen disease Sister    Heart disease Brother    Hypertension Brother    Angioedema Neg Hx    Asthma Neg Hx    Atopy Neg Hx    Eczema Neg Hx    Immunodeficiency Neg Hx    Urticaria Neg Hx     Social History Social History   Tobacco Use   Smoking status: Former    Packs/day: 0.50    Years: 30.00    Additional pack years: 0.00    Total pack years: 15.00    Types: Cigarettes    Quit date: 12/08/1994    Years since quitting: 28.2   Smokeless tobacco: Never  Vaping Use   Vaping Use: Never used  Substance Use Topics   Alcohol use: No   Drug use: No     Allergies   Crab [shellfish allergy], Erythromycin, Hm lidocaine patch [lidocaine], Erdafitinib, Fish allergy, Metoclopramide, Sulfacetamide, Sulfacetamide sodium-sulfur, Doxycycline, Levaquin [levofloxacin in d5w], Morphine, and Sulfa antibiotics   Review of Systems Review of Systems  Constitutional:  Positive for appetite change, chills, fatigue and fever.  HENT:  Negative for sore throat, trouble swallowing and voice change.   Eyes:  Negative for discharge.  Respiratory:  Positive for cough, shortness of  breath  and wheezing.   Cardiovascular:  Negative for chest pain.  Gastrointestinal:  Positive for abdominal pain, diarrhea and nausea. Negative for vomiting.  Genitourinary:  Negative for dysuria.  Neurological:  Negative for syncope and headaches.     Physical Exam Triage Vital Signs ED Triage Vitals  Enc Vitals Group     BP 03/03/23 1632 133/85     Pulse Rate 03/03/23 1632 96     Resp 03/03/23 1632 18     Temp 03/03/23 1632 100.1 F (37.8 C)     Temp Source 03/03/23 1632 Oral     SpO2 03/03/23 1632 95 %     Weight --      Height --      Head Circumference --      Peak Flow --      Pain Score 03/03/23 1634 0     Pain Loc --      Pain Edu? --      Excl. in [place]? --    No data found.  Updated Vital Signs BP 133/85 (BP Location: Right Arm)   Pulse 96   Temp 100.1 F (37.8 C) (Oral)   Resp 18   SpO2 95%   Visual Acuity Right Eye Distance:   Left Eye Distance:   Bilateral Distance:    Right Eye Near:   Left Eye Near:    Bilateral Near:     Physical Exam Vitals and nursing note reviewed.  Constitutional:      General: She is not in acute distress.    Appearance: She is well-developed.  HENT:     Head: Normocephalic and atraumatic.     Right Ear: External ear normal.     Left Ear: External ear normal.     Nose: Rhinorrhea present.     Mouth/Throat:     Mouth: Mucous membranes are moist.  Eyes:     General: No scleral icterus.       Right eye: No discharge.        Left eye: No discharge.     Conjunctiva/sclera: Conjunctivae normal.     Pupils: Pupils are equal, round, and reactive to light.  Cardiovascular:     Rate and Rhythm: Normal rate and regular rhythm.     Heart sounds: Normal heart sounds, S1 normal and S2 normal. No murmur heard. Pulmonary:     Effort: Pulmonary effort is normal. No respiratory distress.     Breath sounds: Examination of the right-upper field reveals wheezing. Examination of the left-upper field reveals wheezing. Wheezing present.   Abdominal:     General: Abdomen is flat. There is no distension.     Palpations: Abdomen is soft. There is no mass.     Tenderness: There is abdominal tenderness. There is no guarding or rebound.     Hernia: No hernia is present.  Musculoskeletal:        General: No swelling. Normal range of motion.     Cervical back: Normal range of motion and neck supple.  Lymphadenopathy:     Cervical: Cervical adenopathy present.  Skin:    General: Skin is warm and dry.     Capillary Refill: Capillary refill takes less than 2 seconds.  Neurological:     Mental Status: She is alert.  Psychiatric:        Mood and Affect: Mood normal.        Behavior: Behavior is cooperative.      UC Treatments / Results  Labs (all labs ordered  are listed, but only abnormal results are displayed) Labs Reviewed - No data to display  EKG   Radiology DG Chest 2 View  Result Date: 03/03/2023 CLINICAL DATA:  Cough EXAM: CHEST - 2 VIEW COMPARISON:  09/30/2022 FINDINGS: The heart size and mediastinal contours are within normal limits. Both lungs are clear. The visualized skeletal structures are unremarkable. IMPRESSION: No active cardiopulmonary disease. Electronically Signed   By: Rolm Baptise M.D.   On: 03/03/2023 17:11    Procedures Procedures (including critical care time)  Medications Ordered in UC Medications  acetaminophen (TYLENOL) tablet 975 mg (975 mg Oral Given 03/03/23 1705)  albuterol (PROVENTIL) (2.5 MG/3ML) 0.083% nebulizer solution 2.5 mg (2.5 mg Nebulization Given 03/03/23 1706)  ondansetron (ZOFRAN-ODT) disintegrating tablet 4 mg (4 mg Oral Given 03/03/23 1705)    Initial Impression / Assessment and Plan / UC Course  I have reviewed the triage vital signs and the nursing notes.  Pertinent labs & imaging results that were available during my care of the patient were reviewed by me and considered in my medical decision making (see chart for details).  Vitals in triage reviewed, patient is  hemodynamically stable.  Temperature of 100.1 in clinic given Tylenol and Zofran for nausea.  Albuterol breathing treatment given in clinic, patient reports subjectively feeling better post nebulizer treatment.  Reports a history of pneumonia, due to advanced age and expiratory wheezing obtained chest x-ray.  Chest x-ray negative for infiltrate or acute infection.  Will cover with Augmentin for COPD exacerbation, patient is allergic to azithromycin, sulfa antibiotics, doxycycline and Levaquin.  Advised to continue on nebulizer treatments and discussed symptomatic relief for cough.  Bland diet recommended for diarrhea.  Return and follow-up precautions discussed, patient verbalized understanding, no questions at this time.     Final Clinical Impressions(s) / UC Diagnoses   Final diagnoses:  COPD exacerbation ([place])  Diarrhea, unspecified type     Discharge Instructions      I am starting you on antibiotics to cover you for a COPD exacerbation.  Please take your nebulizer solutions every 6 hours as needed for cough and wheezing.  You can use the Zofran as needed for nausea and vomiting every 8 hours.  Please use the cough syrup up to 3 times daily as needed for coughing, do not drink or drive on this medication as it may make you drowsy.  Please use a bland diet for your diarrhea.  You can alternate between Tylenol and ibuprofen as needed for fever and bodyaches.  Please return to clinic or seek immediate care if you develop worsening of shortness of breath, chest pain, and no improvement of symptoms despite antibiotics.      ED Prescriptions     Medication Sig Dispense Auth. Provider   amoxicillin-clavulanate (AUGMENTIN) 875-125 MG tablet Take 1 tablet by mouth every 12 (twelve) hours. 14 tablet Louretta Shorten, [place], FNP   ondansetron (ZOFRAN-ODT) 4 MG disintegrating tablet Take 1 tablet (4 mg total) by mouth every 8 (eight) hours as needed for nausea or vomiting. 20 tablet Louretta Shorten,  [place], [place]   promethazine-dextromethorphan (PROMETHAZINE-DM) 6.25-15 MG/5ML syrup Take 5 mLs by mouth 3 (three) times daily as needed for cough. 118 mL Sebastin Perlmutter, [place], [place]      PDMP not reviewed this encounter.   Rhealynn Myhre, [place], [place] 03/03/23 [phone]

## 2023-03-03 NOTE — ED Triage Notes (Signed)
Patient reports that she has had a non productive cough, wheezing, diarrhea x 2 episodes, and chills x 5 days.  Patient states she has been taking Tylenol cold and flu and using her nebulizer.

## 2023-03-08 ENCOUNTER — Ambulatory Visit: Payer: 59 | Admitting: Nurse Practitioner

## 2023-03-14 ENCOUNTER — Encounter: Payer: Self-pay | Admitting: Cardiology

## 2023-03-14 ENCOUNTER — Ambulatory Visit: Payer: 59 | Admitting: Podiatry

## 2023-03-20 ENCOUNTER — Encounter: Payer: Self-pay | Admitting: Podiatry

## 2023-03-20 ENCOUNTER — Ambulatory Visit (INDEPENDENT_AMBULATORY_CARE_PROVIDER_SITE_OTHER): Payer: 59 | Admitting: Podiatry

## 2023-03-20 VITALS — BP 152/84

## 2023-03-20 DIAGNOSIS — M2141 Flat foot [pes planus] (acquired), right foot: Secondary | ICD-10-CM | POA: Diagnosis not present

## 2023-03-20 DIAGNOSIS — M2142 Flat foot [pes planus] (acquired), left foot: Secondary | ICD-10-CM | POA: Diagnosis not present

## 2023-03-20 DIAGNOSIS — M79674 Pain in right toe(s): Secondary | ICD-10-CM

## 2023-03-20 DIAGNOSIS — B351 Tinea unguium: Secondary | ICD-10-CM | POA: Diagnosis not present

## 2023-03-20 DIAGNOSIS — M79675 Pain in left toe(s): Secondary | ICD-10-CM

## 2023-03-20 DIAGNOSIS — E119 Type 2 diabetes mellitus without complications: Secondary | ICD-10-CM

## 2023-03-20 DIAGNOSIS — M2012 Hallux valgus (acquired), left foot: Secondary | ICD-10-CM | POA: Diagnosis not present

## 2023-03-20 DIAGNOSIS — E1142 Type 2 diabetes mellitus with diabetic polyneuropathy: Secondary | ICD-10-CM | POA: Diagnosis not present

## 2023-03-20 DIAGNOSIS — M2011 Hallux valgus (acquired), right foot: Secondary | ICD-10-CM | POA: Diagnosis not present

## 2023-03-20 DIAGNOSIS — L84 Corns and callosities: Secondary | ICD-10-CM

## 2023-03-20 NOTE — Progress Notes (Unsigned)
ANNUAL DIABETIC FOOT EXAM  Subjective: Pamela Roberson presents today {jgcomplaint:23593}.  Chief Complaint  Patient presents with   Nail Problem    DFC BS-did not check today A1C-6.? PCP-Janence Moore PCP [facility]- Will have her first appt. 04/06/2023    Patient confirms h/o diabetes.  Patient relates {Numbers; 0-100:15068} year h/o diabetes.  Patient denies any h/o foot wounds.  Patient has h/o foot ulcer of {jgPodToeLocator:23637}, which healed via help of ***.  Patient has h/o amputation(s): {jgamp:23617}.  Patient endorses symptoms of foot numbness.   Patient endorses symptoms of foot tingling.  Patient endorses symptoms of burning in feet.  Patient endorses symptoms of pins/needles sensation in feet.  Patient denies any numbness, tingling, burning, or pins/needle sensation in feet.  Patient has been diagnosed with neuropathy.  Risk factors: {jgriskfactors:24044}.  Pamela Damme, MD is patient's PCP.  Past Medical History:  Diagnosis Date   Arthritis    Asthma    Carotid stenosis, bilateral    Carpal tunnel syndrome    Chronic low back pain    COPD (chronic obstructive pulmonary disease)    Diabetes mellitus without complication    type II    Diabetic neuropathy    Diverticulitis    Dyspnea    mild    Frequent falls    GERD (gastroesophageal reflux disease)    History of bronchitis    Hypercholesterolemia    Hypertension    IBS (irritable bowel syndrome)    Osteoarthritis    knee   Palpitations    Right ventricular enlargement    per pt report   Sleep apnea    mild but no sleep apnea    TMJ (dislocation of temporomandibular joint)    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    Vitamin D deficiency    Patient Active Problem List   Diagnosis Date Noted   Pain in left knee 02/15/2022   Sciatica, left side 02/15/2022   Pain in left shoulder 11/16/2021   Internal hemorrhoids 11/03/2021   Leg cramps 11/03/2021   Myalgia 11/03/2021    Adenomatous polyp of colon 12/04/2020   Anal fissure 12/04/2020   Chronic idiopathic constipation 12/04/2020   Constipation 12/04/2020   Dysphagia 12/04/2020   Gastroesophageal reflux disease 12/04/2020   Rectal pain 12/04/2020   Right upper quadrant pain 12/04/2020   Dizzy 12/11/2017   Pharyngeal dysphagia 12/11/2017   Post-nasal drip 12/11/2017   Referred ear pain, bilateral 12/11/2017   Diabetes mellitus without complication 06/29/2017   Family history of glaucoma 06/29/2017   Hyperopia of both eyes with astigmatism and presbyopia 06/29/2017   Keratoconjunctivitis sicca of both eyes not specified as Sjogren's 06/29/2017   Nuclear sclerotic cataract of both eyes 06/29/2017   Vitreous floater, bilateral 06/29/2017   Biceps tendinitis of right shoulder 04/05/2017   Impingement syndrome of right shoulder 04/05/2017   Arthritis of right acromioclavicular joint 04/05/2017   Partial nontraumatic tear of rotator cuff, right 04/05/2017   Past Surgical History:  Procedure Laterality Date   ABDOMINAL HYSTERECTOMY  1993   CARPAL TUNNEL RELEASE Right    CERVICAL DISC SURGERY  04/29/2015   [place]   ESOPHAGEAL MANOMETRY N/A 03/03/2021   Procedure: ESOPHAGEAL MANOMETRY (EM);  Surgeon: Charlott Rakes, MD;  Location: [facility];  Service: Endoscopy;  Laterality: N/A;   ESOPHAGOGASTRODUODENOSCOPY ENDOSCOPY     with esophagus being stretched twice per pt,    EYE SURGERY     fatty 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  feeding tube placement and removal   2015   NASAL SEPTUM SURGERY     ROTATOR CUFF REPAIR Right    spurs removed from esophagus   2015   TONSILLECTOMY     Current Outpatient Medications on File Prior to Visit  Medication Sig Dispense Refill   acyclovir (ZOVIRAX) 400 MG tablet Take 400 mg by mouth 2 (two) times daily.     ADVAIR HFA 115-21 MCG/ACT inhaler INHALE 2 PUFFS INTO THE LUNGS TWICE DAILY 12 g 5   albuterol (PROAIR HFA) 108 (90 Base) MCG/ACT inhaler  Inhale 1-2 puffs into the lungs every 6 (six) hours as needed for wheezing or shortness of breath. 18 g 1   albuterol (PROVENTIL) (2.5 MG/3ML) 0.083% nebulizer solution Take 3 mLs (2.5 mg total) by nebulization every 4 (four) hours as needed for wheezing or shortness of breath. 75 mL 1   amoxicillin-clavulanate (AUGMENTIN) 875-125 MG tablet Take 1 tablet by mouth every 12 (twelve) hours. 14 tablet 0   Artificial Tear Ointment (DRY EYES OP) Place 1-2 drops into both eyes daily as needed (for dry eyes).      ascorbic acid (VITAMIN C) 1000 MG tablet Take 1 tablet by mouth daily.     atorvastatin (LIPITOR) 10 MG tablet Take 1 tablet by mouth daily.     azelastine (OPTIVAR) 0.05 % ophthalmic solution Place 1 drop into both eyes 2 (two) times daily. 6 mL 5   Blood Glucose Calibration (TRUE METRIX LEVEL 1) Low SOLN      Blood Glucose Monitoring Suppl (TRUE METRIX METER) w/Device KIT      Carbinoxamine Maleate 4 MG TABS Take 1 tablet (4 mg total) by mouth 2 (two) times daily. 60 tablet 1   cholecalciferol (VITAMIN D3) 25 MCG (1000 UNIT) tablet Take 2,000 Units by mouth daily.     cycloSPORINE (RESTASIS) 0.05 % ophthalmic emulsion Place 1 drop into both eyes 2 times daily.     EPINEPHrine 0.3 mg/0.3 mL IJ SOAJ injection INJECT ONE SYRINGE INTO THE MUSCLE AS NEEDED FOR ANAPHYLAXIS 2 each 1   famotidine (PEPCID) 20 MG tablet 20 mg 1-2 times daily. 60 tablet 5   fluticasone (FLONASE) 50 MCG/ACT nasal spray Place 1 spray into both nostrils daily as needed for allergies or rhinitis.     gabapentin (NEURONTIN) 300 MG capsule Take 300 mg by mouth 3 (three) times daily. (Patient not taking: Reported on 01/12/2023)     guaiFENesin (ROBITUSSIN) 100 MG/5ML liquid Take 5-10 mLs (100-200 mg total) by mouth every 4 (four) hours as needed for cough or to loosen phlegm. (Patient not taking: Reported on 01/12/2023) 60 mL 0   JANUMET XR 50-500 MG TB24 Take 1 tablet by mouth daily.     levocetirizine (XYZAL) 5 MG tablet Take 1  tablet (5 mg total) by mouth every evening. 30 tablet 5   metoprolol succinate (TOPROL-XL) 25 MG 24 hr tablet Take by mouth.     montelukast (SINGULAIR) 10 MG tablet Take 1 tablet (10 mg total) by mouth daily. 30 tablet 5   Multiple Vitamin (MULTIVITAMIN WITH MINERALS) TABS tablet Take 1 tablet by mouth daily.     mupirocin ointment (BACTROBAN) 2 % Apply 1 Application topically 2 (two) times daily. To affected area till better 22 g 0   ondansetron (ZOFRAN-ODT) 4 MG disintegrating tablet Take 1 tablet (4 mg total) by mouth every 8 (eight) hours as needed for nausea or vomiting. 20 tablet 0   pantoprazole (PROTONIX) 40 MG tablet Take  1 tablet (40 mg total) by mouth 2 (two) times daily. 60 tablet 5   promethazine-dextromethorphan (PROMETHAZINE-DM) 6.25-15 MG/5ML syrup Take 5 mLs by mouth 3 (three) times daily as needed for cough. 118 mL 0   sucralfate (CARAFATE) 1 g tablet Take 1 g by mouth 4 (four) times daily.      TRUE METRIX BLOOD GLUCOSE TEST test strip 1 each 3 (three) times daily.     TRUEplus Lancets 33G MISC Apply 1 each topically 3 (three) times daily.     [DISCONTINUED] esomeprazole (NEXIUM) 40 MG capsule Take 1 capsule (40 mg total) by mouth daily. (Patient not taking: Reported on 07/08/2020) 30 capsule 0   No current facility-administered medications on file prior to visit.    Allergies  Allergen Reactions   Crab [Shellfish Allergy] Shortness Of Breath and Swelling   Erythromycin Swelling and Rash   Hm Lidocaine Patch [Lidocaine]     Burning on skin   Erdafitinib     Other reaction(s): pt not sure   Fish Allergy     Swelling    Metoclopramide Other (See Comments)    Slurred speech and unable to move muscles,  Caused her to drag her feet   Sulfacetamide     Other reaction(s): itching, rash   Sulfacetamide Sodium-Sulfur Other (See Comments)    Other reaction(s): itching, rash   Doxycycline     GI Intolerance   Levaquin [Levofloxacin In D5w] Other (See Comments)    Causes  irregular heart beat   Morphine Nausea And Vomiting and Rash    Irregular heart beat   Sulfa Antibiotics Rash   Social History   Occupational History    Comment: NA  Tobacco Use   Smoking status: Former    Packs/day: 0.50    Years: 30.00    Additional pack years: 0.00    Total pack years: 15.00    Types: Cigarettes    Quit date: 12/08/1994    Years since quitting: 28.2   Smokeless tobacco: Never  Vaping Use   Vaping Use: Never used  Substance and Sexual Activity   Alcohol use: No   Drug use: No   Sexual activity: Not on file   Family History  Problem Relation Age of Onset   Allergic rhinitis Mother    Diabetes Father        type 2   Heart disease Father    Hypertension Father    Allergic rhinitis Father    Allergic rhinitis Sister    Collagen disease Sister    Heart disease Brother    Hypertension Brother    Angioedema Neg Hx    Asthma Neg Hx    Atopy Neg Hx    Eczema Neg Hx    Immunodeficiency Neg Hx    Urticaria Neg Hx    Immunization History  Administered Date(s) Administered   Influenza,inj,Quad PF,6+ Mos 09/20/2019   Moderna Sars-Covid-2 Vaccination 12/17/2020, 01/28/2021   Pneumococcal Conjugate-13 09/20/2019   Tdap 05/30/2019     Review of Systems: Negative except as noted in the HPI.   Objective: Vitals:   03/20/23 0836  BP: (!) 152/84    Pamela Roberson is a pleasant 68 y.o. female in NAD. AAO X 3.  Vascular Examination: {jgvascular:23595}  Dermatological Examination: {jgderm:23598}  Neurological Examination: {jgneuro:23601::"Protective sensation intact 5/5 intact bilaterally with 10g monofilament b/l.","Vibratory sensation intact b/l.","Proprioception intact bilaterally."}  Musculoskeletal Examination: {jgmsk:23600}  Footwear Assessment: Does the patient wear appropriate shoes? {Yes,No}. Does the patient need inserts/orthotics? {Yes,No}.  Lab Results  Component Value Date   HGBA1C 5.9 (H) 03/30/2017   No results  found. ADA Risk Categorization: Low Risk :  Patient has all of the following: Intact protective sensation No prior foot ulcer  No severe deformity Pedal pulses present  High Risk  Patient has one or more of the following: Loss of protective sensation Absent pedal pulses Severe Foot deformity History of foot ulcer  Assessment: 1. Pain due to onychomycosis of toenails of both feet   2. Callus   3. Hallux valgus, acquired, bilateral   4. Pes planus of both feet   5. Diabetic peripheral neuropathy associated with type 2 diabetes mellitus   6. Encounter for diabetic foot exam     Plan: No orders of the defined types were placed in this encounter.   No orders of the defined types were placed in this encounter.   None  {jgplan:23602::"-Patient/POA to call should there be question/concern in the interim."} Return in about 3 months (around 06/19/2023).  Freddie Breech, DPM

## 2023-03-24 ENCOUNTER — Other Ambulatory Visit: Payer: Self-pay | Admitting: *Deleted

## 2023-03-24 ENCOUNTER — Other Ambulatory Visit: Payer: Self-pay | Admitting: Internal Medicine

## 2023-03-24 DIAGNOSIS — Z1231 Encounter for screening mammogram for malignant neoplasm of breast: Secondary | ICD-10-CM

## 2023-03-30 ENCOUNTER — Ambulatory Visit (INDEPENDENT_AMBULATORY_CARE_PROVIDER_SITE_OTHER): Payer: 59

## 2023-03-30 DIAGNOSIS — B999 Unspecified infectious disease: Secondary | ICD-10-CM

## 2023-03-30 DIAGNOSIS — Z23 Encounter for immunization: Secondary | ICD-10-CM | POA: Diagnosis not present

## 2023-03-30 NOTE — Progress Notes (Signed)
Immunotherapy   Patient Details  Name: KARIME SCHEUERMANN MRN: [id number] Date of Birth: 1956/03/26  03/30/2023 NDC: 7829-5621-30 [id number] EXP: July 18, 2024 Judithann Sauger Pittinger started injections for  Pneumococcal Vaccine Frequency:Once Consent signed in office and patient instructions given on how to treat for the injection site. Patient sat in room twenty seven for thirty minutes without an issue.    Ralene Muskrat 03/30/2023, 10:40 AM

## 2023-04-03 ENCOUNTER — Ambulatory Visit: Payer: Medicare Other | Admitting: Cardiology

## 2023-04-05 NOTE — Progress Notes (Signed)
Cardiology Office Note:    Date:  04/07/2023   ID:  Pamela Roberson, DOB [birth year], MRN [id number]  PCP:  Caffie Damme, MD   [facility] Providers Cardiologist:  Meriam Sprague, MD {  Referring MD: Caffie Damme, MD    History of Present Illness:    Pamela Roberson is a 67 y.o. female with a hx of COPD, DMII, HTN, HLD, OSA and asthma who presents to clinic for follow-up.  Patient was initially seen in 12/2021 for dyspnea on exertion. She had a myoview at OSH on 03/2021 which was normal. TTE 09/2021 with EF 60-65%, no significant valve disease.   Was last seen in clinic on 09/2022 where she was recovering from a URI. Otherwise was doing well.  Today, the patient overall feels well. No chest pain, SOB, orthopnea, LE edema, or palpitations. Tolerating medications as prescribed. Blood pressure is running 140-150s at home.    Past Medical History:  Diagnosis Date   Arthritis    Asthma    Carotid stenosis, bilateral    Carpal tunnel syndrome    Chronic low back pain    COPD (chronic obstructive pulmonary disease) (HCC)    Diabetes mellitus without complication (HCC)    type II    Diabetic neuropathy (HCC)    Diverticulitis    Dyspnea    mild    Frequent falls    GERD (gastroesophageal reflux disease)    History of bronchitis    Hypercholesterolemia    Hypertension    IBS (irritable bowel syndrome)    Osteoarthritis    knee   Palpitations    Right ventricular enlargement    per pt report   Sleep apnea    mild but no sleep apnea    TMJ (dislocation of temporomandibular joint)    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    Vitamin D deficiency     Past Surgical History:  Procedure Laterality Date   ABDOMINAL HYSTERECTOMY  1993   CARPAL TUNNEL RELEASE Right    CERVICAL DISC SURGERY  04/29/2015   [place]   ESOPHAGEAL MANOMETRY N/A 03/03/2021   Procedure: ESOPHAGEAL MANOMETRY (EM);  Surgeon: Charlott Rakes, MD;  Location: [facility]  ENDOSCOPY;  Service: Endoscopy;  Laterality: N/A;   ESOPHAGOGASTRODUODENOSCOPY ENDOSCOPY     with esophagus being stretched twice per pt,    EYE SURGERY     fatty 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      feeding tube placement and removal   2015   NASAL SEPTUM SURGERY     ROTATOR CUFF REPAIR Right    spurs removed from esophagus   2015   TONSILLECTOMY      Current Medications: Current Meds  Medication Sig   ADVAIR HFA 115-21 MCG/ACT inhaler INHALE 2 PUFFS INTO THE LUNGS TWICE DAILY   albuterol (PROAIR HFA) 108 (90 Base) MCG/ACT inhaler Inhale 1-2 puffs into the lungs every 6 (six) hours as needed for wheezing or shortness of breath.   albuterol (PROVENTIL) (2.5 MG/3ML) 0.083% nebulizer solution Take 3 mLs (2.5 mg total) by nebulization every 4 (four) hours as needed for wheezing or shortness of breath.   amLODipine (NORVASC) 5 MG tablet Take 1 tablet (5 mg total) by mouth daily.   Artificial Tear Ointment (DRY EYES OP) Place 1-2 drops into both eyes daily as needed (for dry eyes).    ascorbic acid (VITAMIN C) 1000 MG tablet Take 1 tablet by mouth daily.   azelastine (OPTIVAR)  0.05 % ophthalmic solution Place 1 drop into both eyes 2 (two) times daily.   Blood Glucose Calibration (TRUE METRIX LEVEL 1) Low SOLN    Blood Glucose Monitoring Suppl (TRUE METRIX METER) w/Device KIT    Carbinoxamine Maleate 4 MG TABS Take 1 tablet (4 mg total) by mouth 2 (two) times daily.   cholecalciferol (VITAMIN D3) 25 MCG (1000 UNIT) tablet Take 2,000 Units by mouth daily.   cycloSPORINE (RESTASIS) 0.05 % ophthalmic emulsion Place 1 drop into both eyes 2 times daily.   EPINEPHrine 0.3 mg/0.3 mL IJ SOAJ injection INJECT ONE SYRINGE INTO THE MUSCLE AS NEEDED FOR ANAPHYLAXIS   famotidine (PEPCID) 20 MG tablet 20 mg 1-2 times daily.   fluticasone (FLONASE) 50 MCG/ACT nasal spray Place 1 spray into both nostrils daily as needed for allergies or rhinitis.   gabapentin (NEURONTIN) 300 MG capsule Take 300 mg by mouth 3  (three) times daily.   guaiFENesin (ROBITUSSIN) 100 MG/5ML liquid Take 5-10 mLs (100-200 mg total) by mouth every 4 (four) hours as needed for cough or to loosen phlegm.   JANUMET XR 50-500 MG TB24 Take 1 tablet by mouth daily.   levocetirizine (XYZAL) 5 MG tablet Take 1 tablet (5 mg total) by mouth every evening.   montelukast (SINGULAIR) 10 MG tablet Take 1 tablet (10 mg total) by mouth daily.   Multiple Vitamin (MULTIVITAMIN WITH MINERALS) TABS tablet Take 1 tablet by mouth daily.   mupirocin ointment (BACTROBAN) 2 % Apply 1 Application topically 2 (two) times daily. To affected area till better   ondansetron (ZOFRAN-ODT) 4 MG disintegrating tablet Take 1 tablet (4 mg total) by mouth every 8 (eight) hours as needed for nausea or vomiting.   pantoprazole (PROTONIX) 40 MG tablet Take 1 tablet (40 mg total) by mouth 2 (two) times daily.   promethazine-dextromethorphan (PROMETHAZINE-DM) 6.25-15 MG/5ML syrup Take 5 mLs by mouth 3 (three) times daily as needed for cough.   sucralfate (CARAFATE) 1 g tablet Take 1 g by mouth 4 (four) times daily.    TRUE METRIX BLOOD GLUCOSE TEST test strip 1 each 3 (three) times daily.   TRUEplus Lancets 33G MISC Apply 1 each topically 3 (three) times daily.   [DISCONTINUED] acyclovir (ZOVIRAX) 400 MG tablet Take 400 mg by mouth 2 (two) times daily.   [DISCONTINUED] atorvastatin (LIPITOR) 10 MG tablet Take 1 tablet by mouth daily.   [DISCONTINUED] metoprolol succinate (TOPROL-XL) 25 MG 24 hr tablet Take by mouth.     Allergies:   Crab [shellfish allergy], Erythromycin, Hm lidocaine patch [lidocaine], Erdafitinib, Fish allergy, Metoclopramide, Sulfacetamide, Sulfacetamide sodium-sulfur, Doxycycline, Levaquin [levofloxacin in d5w], Morphine, and Sulfa antibiotics   Social History   Socioeconomic History   Marital status: Widowed    Spouse name: Not on file   Number of children: 3   Years of education: Not on file   Highest education level: 11th grade   Occupational History    Comment: NA  Tobacco Use   Smoking status: Former    Packs/day: 0.50    Years: 30.00    Additional pack years: 0.00    Total pack years: 15.00    Types: Cigarettes    Quit date: 12/08/1994    Years since quitting: 28.3   Smokeless tobacco: Never  Vaping Use   Vaping Use: Never used  Substance and Sexual Activity   Alcohol use: No   Drug use: No   Sexual activity: Not on file  Other Topics Concern   Not on file  Social History Narrative   Lives with daughter   Caffeine- coffee 12 oz   Social Determinants of Corporate investment banker Strain: Not on file  Food Insecurity: Not on file  Transportation Needs: Not on file  Physical Activity: Not on file  Stress: Not on file  Social Connections: Not on file     Family History: The patient's family history includes Allergic rhinitis in her father, mother, and sister; Collagen disease in her sister; Diabetes in her father; Heart disease in her brother and father; Hypertension in her brother and father. There is no history of Angioedema, Asthma, Atopy, Eczema, Immunodeficiency, or Urticaria.  ROS:   Please see the history of present illness.    Review of Systems  Constitutional:  Negative for chills and fever.  HENT:  Negative for congestion and sinus pain.   Eyes:  Negative for blurred vision.  Respiratory:  Positive for shortness of breath.   Cardiovascular:  Negative for chest pain, palpitations, orthopnea, claudication, leg swelling and PND.  Gastrointestinal:  Negative for heartburn and nausea.  Genitourinary:  Negative for urgency.  Musculoskeletal:  Negative for joint pain and myalgias.  Skin:  Negative for itching and rash.  Neurological:  Positive for dizziness. Negative for headaches.  Endo/Heme/Allergies:  Does not bruise/bleed easily.  Psychiatric/Behavioral:  The patient is not nervous/anxious and does not have insomnia.    All other systems reviewed and are negative.  EKGs/Labs/Other  Studies Reviewed:    The following studies were reviewed today:  Echo  09/17/2021: Conclusions: 1. Normal study: normal cardiac chamber sizes and function; normal valve anatomy and  function; no pericardial effusion or intracardiac mass. No intracardiac shunt(s) by 2-Dimensional and color flow imaging. Normal thoracic aorta and aortic arch.   EKG:  EKG is personally reviewed. 10/03/2022:  EKG was not ordered. 01/04/22:  Sinus rhythm, rate 78 bpm  Recent Labs: 01/12/2023: Hemoglobin 14.0   Recent Lipid Panel No results found for: "CHOL", "TRIG", "HDL", "CHOLHDL", "VLDL", "LDLCALC", "LDLDIRECT"   Risk Assessment/Calculations:           Physical Exam:    VS:  BP 138/84   Pulse 81   Ht 5\' 6"  (1.676 m)   Wt 203 lb 6.4 oz (92.3 kg)   SpO2 98%   BMI 32.83 kg/m     Wt Readings from Last 3 Encounters:  04/07/23 203 lb 6.4 oz (92.3 kg)  04/06/23 202 lb (91.6 kg)  01/12/23 212 lb 1.6 oz (96.2 kg)     GEN: Well nourished, well developed in no acute distress HEENT: Normal NECK: No JVD; No carotid bruits CARDIAC: RRR, 2/6 systolic murmur, no rubs or gallops RESPIRATORY:  Clear to auscultation without rales, wheezing or rhonchi  ABDOMEN: Soft, non-tender, non-distended MUSCULOSKELETAL:  No edema; No deformity  SKIN: Warm and dry NEUROLOGIC:  Alert and oriented x 3 PSYCHIATRIC:  Normal affect   ASSESSMENT:    1. Dyspnea on exertion   2. Primary hypertension   3. Diabetes mellitus with coincident hypertension (HCC)   4. Uncomplicated asthma, unspecified asthma severity, unspecified whether persistent      PLAN:    In order of problems listed above:  #Dyspnea on Exertion: Myoview 03/2021 without ischemia or infarction. TTE with LVEF 60-65%, no significant valve disease.   #HTN: Running 140s at home.  -Start amlodipine 5mg  daily -Continue metop 25mg  XL daily  #DMII: -Continue janumet 50-55mg   #HLD: -Continue lipitor 10mg  daily -Plan for lipids per  PCP  #COPD: #Asthma: -Follow-up  PCP  #Bilateral Carotid Artery Disease: -Minimal narrowing on carotid ultrasound -Continue lipitor 10mg  daily   Follow-up:  6 months.    Medication Adjustments/Labs and Tests Ordered: Current medicines are reviewed at length with the patient today.  Concerns regarding medicines are outlined above.   No orders of the defined types were placed in this encounter.  Meds ordered this encounter  Medications   acyclovir (ZOVIRAX) 400 MG tablet    Sig: Take 1 tablet (400 mg total) by mouth 2 (two) times daily.    Dispense:  30 tablet    Refill:  4   metoprolol succinate (TOPROL-XL) 25 MG 24 hr tablet    Sig: Take 1 tablet (25 mg total) by mouth daily.    Dispense:  30 tablet    Refill:  4   atorvastatin (LIPITOR) 10 MG tablet    Sig: Take 1 tablet (10 mg total) by mouth daily.    Dispense:  30 tablet    Refill:  3   amLODipine (NORVASC) 5 MG tablet    Sig: Take 1 tablet (5 mg total) by mouth daily.    Dispense:  30 tablet    Refill:  3   Patient Instructions  Medication Instructions:  START TAKING AMLODIPINE 5 MG DAILY  *If you need a refill on your cardiac medications before your next appointment, please call your pharmacy*   Lab Work: NONE If you have labs (blood work) drawn today and your tests are completely normal, you will receive your results only by: MyChart Message (if you have MyChart) OR A paper copy in the mail If you have any lab test that is abnormal or we need to change your treatment, we will call you to review the results.     Follow-Up: 6 MONTH FOLLOW UP WITH DR. Shari Prows         Signed, Meriam Sprague, MD  04/07/2023 9:38 AM    [facility]

## 2023-04-06 ENCOUNTER — Ambulatory Visit (INDEPENDENT_AMBULATORY_CARE_PROVIDER_SITE_OTHER): Payer: 59 | Admitting: Nurse Practitioner

## 2023-04-06 ENCOUNTER — Encounter: Payer: Self-pay | Admitting: Nurse Practitioner

## 2023-04-06 VITALS — BP 140/70 | HR 88 | Temp 98.4°F | Ht 66.0 in | Wt 202.0 lb

## 2023-04-06 DIAGNOSIS — J4520 Mild intermittent asthma, uncomplicated: Secondary | ICD-10-CM

## 2023-04-06 DIAGNOSIS — Z7689 Persons encountering health services in other specified circumstances: Secondary | ICD-10-CM | POA: Diagnosis not present

## 2023-04-06 DIAGNOSIS — M25512 Pain in left shoulder: Secondary | ICD-10-CM

## 2023-04-06 DIAGNOSIS — H16223 Keratoconjunctivitis sicca, not specified as Sjogren's, bilateral: Secondary | ICD-10-CM | POA: Diagnosis not present

## 2023-04-06 DIAGNOSIS — Z1211 Encounter for screening for malignant neoplasm of colon: Secondary | ICD-10-CM | POA: Diagnosis not present

## 2023-04-06 DIAGNOSIS — E1140 Type 2 diabetes mellitus with diabetic neuropathy, unspecified: Secondary | ICD-10-CM | POA: Diagnosis not present

## 2023-04-06 DIAGNOSIS — Z1159 Encounter for screening for other viral diseases: Secondary | ICD-10-CM

## 2023-04-06 DIAGNOSIS — Z2821 Immunization not carried out because of patient refusal: Secondary | ICD-10-CM

## 2023-04-06 DIAGNOSIS — N3946 Mixed incontinence: Secondary | ICD-10-CM

## 2023-04-06 NOTE — Progress Notes (Signed)
Hershal Coria Martin,acting as a Neurosurgeon for [name], FNP.,have documented all relevant documentation on the behalf of [name], FNP,as directed by  [name], FNP while in the presence of [name], FNP.    Subjective:     Patient ID: Pamela Roberson , female    DOB: [birth year] , 67 y.o.   MRN: [id number]   Chief Complaint  Patient presents with   Establish Care    HPI  Patient presents today to establish care, she found out about the practice through her insurance.  She was going to [facility] at [place] she seen her last in December [facility]. She has new dentures done yesterday so difficult to understand. 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 Wid[employer]. She has 3 children - all live locally. Youngest daughter - MS, HTN older daughter - HTN. Son - history of anxiety  PMH - Asthma, Diabetes - over 10 years - was on Janumet - she had elevated blood sugars did not think was working. She continues to have a few pills. When she was in [place] she was using victoza and small amount of insulin. She has not been checking her blood sugar regularly due to it being broke.  She is not taking her gabapentin due to having forgetfulness. She quit taking in October due to memory. She felt like her toes were locked up. She is followed by Dr. Bosie Clos for her diverticulitis. She had cancerous polyps removed. She is followed by Laurance Flatten for cardiology  She has mild sleep apnea - but does not have a machine.   She has been trying hot compresses to her shoulder.   patient states she doesn't have any concerns and has nothing to discuss today.  Patient does states she has a boil on her vagina she would like looked at today.  BP Readings from Last 3 Encounters: 04/06/23 : (!) 140/70 03/20/23 : (!) 152/84 03/03/23 : 133/85       Past Medical History:  Diagnosis Date   Arthritis    Asthma    Carotid stenosis, bilateral    Carpal tunnel syndrome    Chronic low back  pain    COPD (chronic obstructive pulmonary disease) (HCC)    Diabetes mellitus without complication (HCC)    type II    Diabetic neuropathy (HCC)    Diverticulitis    Dyspnea    mild    Frequent falls    GERD (gastroesophageal reflux disease)    History of bronchitis    Hypercholesterolemia    Hypertension    IBS (irritable bowel syndrome)    Osteoarthritis    knee   Palpitations    Right ventricular enlargement    per pt report   Sleep apnea    mild but no sleep apnea    TMJ (dislocation of temporomandibular joint)    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    Vitamin D deficiency      Family History  Problem Relation Age of Onset   Allergic rhinitis Mother    Diabetes Father        type 2   Heart disease Father    Hypertension Father    Allergic rhinitis Father    Allergic rhinitis Sister    Collagen disease Sister    Heart disease Brother    Hypertension Brother    Angioedema Neg Hx    Asthma Neg Hx    Atopy Neg Hx  Eczema Neg Hx    Immunodeficiency Neg Hx    Urticaria Neg Hx      Current Outpatient Medications:    ADVAIR HFA 115-21 MCG/ACT inhaler, INHALE 2 PUFFS INTO THE LUNGS TWICE DAILY, Disp: 12 g, Rfl: 5   albuterol (PROAIR HFA) 108 (90 Base) MCG/ACT inhaler, Inhale 1-2 puffs into the lungs every 6 (six) hours as needed for wheezing or shortness of breath., Disp: 18 g, Rfl: 1   albuterol (PROVENTIL) (2.5 MG/3ML) 0.083% nebulizer solution, Take 3 mLs (2.5 mg total) by nebulization every 4 (four) hours as needed for wheezing or shortness of breath., Disp: 75 mL, Rfl: 1   Artificial Tear Ointment (DRY EYES OP), Place 1-2 drops into both eyes daily as needed (for dry eyes). , Disp: , Rfl:    ascorbic acid (VITAMIN C) 1000 MG tablet, Take 1 tablet by mouth daily., Disp: , Rfl:    azelastine (OPTIVAR) 0.05 % ophthalmic solution, Place 1 drop into both eyes 2 (two) times daily., Disp: 6 mL, Rfl: 5   Blood Glucose Calibration (TRUE METRIX LEVEL 1) Low SOLN, ,  Disp: , Rfl:    Blood Glucose Monitoring Suppl (TRUE METRIX METER) w/Device KIT, , Disp: , Rfl:    Carbinoxamine Maleate 4 MG TABS, Take 1 tablet (4 mg total) by mouth 2 (two) times daily., Disp: 60 tablet, Rfl: 1   cholecalciferol (VITAMIN D3) 25 MCG (1000 UNIT) tablet, Take 2,000 Units by mouth daily., Disp: , Rfl:    cycloSPORINE (RESTASIS) 0.05 % ophthalmic emulsion, Place 1 drop into both eyes 2 times daily., Disp: , Rfl:    EPINEPHrine 0.3 mg/0.3 mL IJ SOAJ injection, INJECT ONE SYRINGE INTO THE MUSCLE AS NEEDED FOR ANAPHYLAXIS, Disp: 2 each, Rfl: 1   famotidine (PEPCID) 20 MG tablet, 20 mg 1-2 times daily., Disp: 60 tablet, Rfl: 5   fluticasone (FLONASE) 50 MCG/ACT nasal spray, Place 1 spray into both nostrils daily as needed for allergies or rhinitis., Disp: , Rfl:    JANUMET XR 50-500 MG TB24, Take 1 tablet by mouth daily., Disp: , Rfl:    levocetirizine (XYZAL) 5 MG tablet, Take 1 tablet (5 mg total) by mouth every evening., Disp: 30 tablet, Rfl: 5   montelukast (SINGULAIR) 10 MG tablet, Take 1 tablet (10 mg total) by mouth daily., Disp: 30 tablet, Rfl: 5   Multiple Vitamin (MULTIVITAMIN WITH MINERALS) TABS tablet, Take 1 tablet by mouth daily., Disp: , Rfl:    mupirocin ointment (BACTROBAN) 2 %, Apply 1 Application topically 2 (two) times daily. To affected area till better, Disp: 22 g, Rfl: 0   ondansetron (ZOFRAN-ODT) 4 MG disintegrating tablet, Take 1 tablet (4 mg total) by mouth every 8 (eight) hours as needed for nausea or vomiting., Disp: 20 tablet, Rfl: 0   pantoprazole (PROTONIX) 40 MG tablet, Take 1 tablet (40 mg total) by mouth 2 (two) times daily., Disp: 60 tablet, Rfl: 5   promethazine-dextromethorphan (PROMETHAZINE-DM) 6.25-15 MG/5ML syrup, Take 5 mLs by mouth 3 (three) times daily as needed for cough., Disp: 118 mL, Rfl: 0   sucralfate (CARAFATE) 1 g tablet, Take 1 g by mouth 4 (four) times daily. , Disp: , Rfl:    TRUE METRIX BLOOD GLUCOSE TEST test strip, 1 each 3 (three)  times daily., Disp: , Rfl:    TRUEplus Lancets 33G MISC, Apply 1 each topically 3 (three) times daily., Disp: , Rfl:    acyclovir (ZOVIRAX) 400 MG tablet, Take 1 tablet (400 mg total) by mouth  2 (two) times daily., Disp: 30 tablet, Rfl: 4   amLODipine (NORVASC) 5 MG tablet, Take 1 tablet (5 mg total) by mouth daily., Disp: 30 tablet, Rfl: 3   atorvastatin (LIPITOR) 10 MG tablet, Take 1 tablet (10 mg total) by mouth daily., Disp: 30 tablet, Rfl: 3   gabapentin (NEURONTIN) 300 MG capsule, Take 300 mg by mouth 3 (three) times daily., Disp: , Rfl:    guaiFENesin (ROBITUSSIN) 100 MG/5ML liquid, Take 5-10 mLs (100-200 mg total) by mouth every 4 (four) hours as needed for cough or to loosen phlegm., Disp: 60 mL, Rfl: 0   metoprolol succinate (TOPROL-XL) 25 MG 24 hr tablet, Take 1 tablet (25 mg total) by mouth daily., Disp: 30 tablet, Rfl: 4   Allergies  Allergen Reactions   Crab [Shellfish Allergy] Shortness Of Breath and Swelling   Erythromycin Swelling and Rash   Hm Lidocaine Patch [Lidocaine]     Burning on skin   Erdafitinib     Other reaction(s): pt not sure   Fish Allergy     Swelling    Metoclopramide Other (See Comments)    Slurred speech and unable to move muscles,  Caused her to drag her feet   Sulfacetamide     Other reaction(s): itching, rash   Sulfacetamide Sodium-Sulfur Other (See Comments)    Other reaction(s): itching, rash   Doxycycline     GI Intolerance   Levaquin [Levofloxacin In D5w] Other (See Comments)    Causes irregular heart beat   Morphine Nausea And Vomiting and Rash    Irregular heart beat   Sulfa Antibiotics Rash     Review of Systems  Constitutional: Negative.   Respiratory: Negative.    Cardiovascular: Negative.   Endocrine: Negative for polydipsia, polyphagia and polyuria.  Neurological: Negative.   Psychiatric/Behavioral: Negative.       Today's Vitals   04/06/23 1134  BP: (!) 140/70  Pulse: 88  Temp: 98.4 F (36.9 C)  TempSrc: Oral   Weight: 202 lb (91.6 kg)  Height: 5\' 6"  (1.676 m)  PainSc: 0-No pain   Body mass index is 32.6 kg/m.  Wt Readings from Last 3 Encounters:  04/07/23 203 lb 6.4 oz (92.3 kg)  04/06/23 202 lb (91.6 kg)  01/12/23 212 lb 1.6 oz (96.2 kg)    Objective:  Physical Exam Vitals reviewed.  Constitutional:      Appearance: Normal appearance.  Cardiovascular:     Rate and Rhythm: Normal rate and regular rhythm.     Pulses: Normal pulses.     Heart sounds: Normal heart sounds. No murmur heard. Pulmonary:     Effort: Pulmonary effort is normal. No respiratory distress.     Breath sounds: Normal breath sounds. No wheezing.  Musculoskeletal:        General: Tenderness (left shoulder) present. No swelling or deformity.  Skin:    General: Skin is warm and dry.     Capillary Refill: Capillary refill takes less than 2 seconds.  Neurological:     General: No focal deficit present.     Mental Status: She is alert and oriented to person, place, and time.     Cranial Nerves: No cranial nerve deficit.     Motor: No weakness.         Assessment And Plan:     1. Type 2 diabetes mellitus with diabetic neuropathy, without long-term current use of insulin (HCC) Comments: Will check HgbA1c. - Hemoglobin A1c - Microalbumin / creatinine urine ratio - CMP14+EGFR -  Lipid panel  2. Acute pain of left shoulder Comments: [facility] she has seen in the past. will call back to office  if need referral  3. Keratoconjunctivitis sicca of both eyes not specified as Sjogren's  4. Intermittent asthma without complication, unspecified asthma severity Comments: Continue current medications  5. Mixed stress and urge urinary incontinence - Ambulatory referral to Urology  6. Herpes zoster vaccination declined Declines shingrix, educated on disease process and is aware if he changes his mind to notify office  7. Encounter for screening colonoscopy According to USPTF Colorectal cancer Screening  guidelines. Colonoscopy is recommended every 10 years, starting at age 29 years. Will refer to GI for colon cancer screening. - Ambulatory referral to Gastroenterology  8. Encounter for hepatitis C screening test for low risk patient - Hepatitis C antibody  9. Establishing care with new doctor, encounter for Patient is here to establish care. Went over patient medical, family, social and surgical history. Reviewed with patient their medications and any allergies  Reviewed with patient their sexual orientation, drug/tobacco and alcohol use Dicussed any new concerns with patient  recommended patient comes in for a physical exam and complete blood work.  Educated patient about the importance of annual screenings and immunizations.  Advised patient to eat a healthy diet along with exercise for atleast 30-45 min atleast 4-5 days of the week.      Patient was given opportunity to ask questions. Patient verbalized understanding of the plan and was able to repeat key elements of the plan. All questions were answered to their satisfaction.  [name], FNP   I, [name], FNP, have reviewed all documentation for this visit. The documentation on 04/06/23 for the exam, diagnosis, procedures, and orders are all accurate and complete.   IF YOU HAVE BEEN REFERRED TO A SPECIALIST, IT MAY TAKE 1-2 WEEKS TO SCHEDULE/PROCESS THE REFERRAL. IF YOU HAVE NOT HEARD FROM US/SPECIALIST IN TWO WEEKS, PLEASE GIVE [place] A CALL AT [phone].   THE PATIENT IS ENCOURAGED TO PRACTICE SOCIAL DISTANCING DUE TO THE COVID-19 PANDEMIC.

## 2023-04-06 NOTE — Patient Instructions (Signed)

## 2023-04-07 ENCOUNTER — Ambulatory Visit: Payer: 59 | Attending: Cardiology | Admitting: Cardiology

## 2023-04-07 ENCOUNTER — Encounter: Payer: Self-pay | Admitting: Cardiology

## 2023-04-07 VITALS — BP 138/84 | HR 81 | Ht 66.0 in | Wt 203.4 lb

## 2023-04-07 DIAGNOSIS — I10 Essential (primary) hypertension: Secondary | ICD-10-CM

## 2023-04-07 DIAGNOSIS — R0609 Other forms of dyspnea: Secondary | ICD-10-CM | POA: Diagnosis not present

## 2023-04-07 DIAGNOSIS — J45909 Unspecified asthma, uncomplicated: Secondary | ICD-10-CM

## 2023-04-07 DIAGNOSIS — E119 Type 2 diabetes mellitus without complications: Secondary | ICD-10-CM

## 2023-04-07 LAB — CMP14+EGFR
ALT: 12 IU/L (ref 0–32)
AST: 15 IU/L (ref 0–40)
Albumin/Globulin Ratio: 1.7 (ref 1.2–2.2)
Albumin: 4 g/dL (ref 3.9–4.9)
Alkaline Phosphatase: 90 IU/L (ref 44–121)
BUN/Creatinine Ratio: 9 — ABNORMAL LOW (ref 12–28)
BUN: 8 mg/dL (ref 8–27)
Bilirubin Total: 0.6 mg/dL (ref 0.0–1.2)
CO2: 24 mmol/L (ref 20–29)
Calcium: 9.5 mg/dL (ref 8.7–10.3)
Chloride: 107 mmol/L — ABNORMAL HIGH (ref 96–106)
Creatinine, Ser: 0.89 mg/dL (ref 0.57–1.00)
Globulin, Total: 2.3 g/dL (ref 1.5–4.5)
Glucose: 99 mg/dL (ref 70–99)
Potassium: 4.4 mmol/L (ref 3.5–5.2)
Sodium: 146 mmol/L — ABNORMAL HIGH (ref 134–144)
Total Protein: 6.3 g/dL (ref 6.0–8.5)
eGFR: 71 mL/min/{1.73_m2} (ref >59)

## 2023-04-07 LAB — LIPID PANEL
Chol/HDL Ratio: 2.9 ratio (ref 0.0–4.4)
Cholesterol, Total: 163 mg/dL (ref 100–199)
HDL: 57 mg/dL (ref >39)
LDL Chol Calc (NIH): 90 mg/dL (ref 0–99)
Triglycerides: 85 mg/dL (ref 0–149)
VLDL Cholesterol Cal: 16 mg/dL (ref 5–40)

## 2023-04-07 LAB — MICROALBUMIN / CREATININE URINE RATIO
Creatinine, Urine: 229.7 mg/dL
Microalb/Creat Ratio: 3 mg/g creat (ref 0–29)
Microalbumin, Urine: 5.9 ug/mL

## 2023-04-07 LAB — HEMOGLOBIN A1C
Est. average glucose Bld gHb Est-mCnc: 157 mg/dL
Hgb A1c MFr Bld: 7.1 % — ABNORMAL HIGH (ref 4.8–5.6)

## 2023-04-07 LAB — HEPATITIS C ANTIBODY: Hep C Virus Ab: NONREACTIVE

## 2023-04-07 MED ORDER — AMLODIPINE BESYLATE 5 MG PO TABS: 5.0000 mg | ORAL_TABLET | Freq: Every day | ORAL | 3 refills | Status: DC

## 2023-04-07 MED ORDER — ATORVASTATIN CALCIUM 10 MG PO TABS: 10.0000 mg | ORAL_TABLET | Freq: Every day | ORAL | 3 refills | Status: DC

## 2023-04-07 MED ORDER — ACYCLOVIR 400 MG PO TABS: 400.0000 mg | ORAL_TABLET | Freq: Two times a day (BID) | ORAL | 4 refills | Status: DC

## 2023-04-07 MED ORDER — METOPROLOL SUCCINATE ER 25 MG PO TB24: 25.0000 mg | ORAL_TABLET | Freq: Every day | ORAL | 4 refills | Status: DC

## 2023-04-07 NOTE — Patient Instructions (Signed)
Medication Instructions:  START TAKING AMLODIPINE 5 MG DAILY  *If you need a refill on your cardiac medications before your next appointment, please call your pharmacy*   Lab Work: NONE If you have labs (blood work) drawn today and your tests are completely normal, you will receive your results only by: MyChart Message (if you have MyChart) OR A paper copy in the mail If you have any lab test that is abnormal or we need to change your treatment, we will call you to review the results.     Follow-Up: 6 MONTH FOLLOW UP WITH DR. Shari Prows

## 2023-04-10 DIAGNOSIS — M5416 Radiculopathy, lumbar region: Secondary | ICD-10-CM | POA: Diagnosis not present

## 2023-04-10 DIAGNOSIS — E1142 Type 2 diabetes mellitus with diabetic polyneuropathy: Secondary | ICD-10-CM | POA: Diagnosis not present

## 2023-04-23 MED ORDER — JANUMET XR 50-500 MG PO TB24: 1.0000 | ORAL_TABLET | Freq: Every day | ORAL | 1 refills | Status: DC

## 2023-05-05 ENCOUNTER — Ambulatory Visit
Admission: RE | Admit: 2023-05-05 | Discharge: 2023-05-05 | Disposition: A | Discharge status: Home / Self Care | Payer: 59 | Source: Ambulatory Visit | Attending: Internal Medicine | Admitting: Internal Medicine

## 2023-05-05 DIAGNOSIS — Z1231 Encounter for screening mammogram for malignant neoplasm of breast: Secondary | ICD-10-CM

## 2023-05-19 ENCOUNTER — Ambulatory Visit (HOSPITAL_COMMUNITY)
Admission: EM | Admit: 2023-05-19 | Discharge: 2023-05-19 | Disposition: A | Discharge status: Home / Self Care | Payer: 59 | Attending: Emergency Medicine | Admitting: Emergency Medicine

## 2023-05-19 ENCOUNTER — Encounter (HOSPITAL_COMMUNITY): Payer: Self-pay

## 2023-05-19 DIAGNOSIS — B9689 Other specified bacterial agents as the cause of diseases classified elsewhere: Secondary | ICD-10-CM | POA: Diagnosis not present

## 2023-05-19 DIAGNOSIS — K2100 Gastro-esophageal reflux disease with esophagitis, without bleeding: Secondary | ICD-10-CM | POA: Diagnosis not present

## 2023-05-19 DIAGNOSIS — J019 Acute sinusitis, unspecified: Secondary | ICD-10-CM | POA: Insufficient documentation

## 2023-05-19 LAB — BASIC METABOLIC PANEL
Anion gap: 9 (ref 5–15)
BUN: 6 mg/dL — ABNORMAL LOW (ref 8–23)
CO2: 24 mmol/L (ref 22–32)
Calcium: 9.3 mg/dL (ref 8.9–10.3)
Chloride: 105 mmol/L (ref 98–111)
Creatinine, Ser: 0.85 mg/dL (ref 0.44–1.00)
GFR, Estimated: >60 mL/min (ref >60)
Glucose, Bld: 118 mg/dL — ABNORMAL HIGH (ref 70–99)
Potassium: 4.1 mmol/L (ref 3.5–5.1)
Sodium: 138 mmol/L (ref 135–145)

## 2023-05-19 LAB — CBC
HCT: 46.5 % — ABNORMAL HIGH (ref 36.0–46.0)
Hemoglobin: 15.1 g/dL — ABNORMAL HIGH (ref 12.0–15.0)
MCH: 29 pg (ref 26.0–34.0)
MCHC: 32.5 g/dL (ref 30.0–36.0)
MCV: 89.3 fL (ref 80.0–100.0)
Platelets: 284 10*3/uL (ref 150–400)
RBC: 5.21 MIL/uL — ABNORMAL HIGH (ref 3.87–5.11)
RDW: 13.3 % (ref 11.5–15.5)
WBC: 5.1 10*3/uL (ref 4.0–10.5)
nRBC: 0 % (ref 0.0–0.2)

## 2023-05-19 MED ORDER — ALUM & MAG HYDROXIDE-SIMETH 200-200-20 MG/5ML PO SUSP
ORAL | Status: AC
  Filled 2023-05-19: qty 30

## 2023-05-19 MED ORDER — LIDOCAINE VISCOUS HCL 2 % MT SOLN
15.0000 mL | Freq: Once | OROMUCOSAL | Status: AC
  Administered 2023-05-19: 15 mL via OROMUCOSAL

## 2023-05-19 MED ORDER — ONDANSETRON 4 MG PO TBDP: 4.0000 mg | ORAL_TABLET | Freq: Three times a day (TID) | ORAL | 0 refills | Status: DC | PRN

## 2023-05-19 MED ORDER — ONDANSETRON 4 MG PO TBDP
ORAL_TABLET | ORAL | Status: AC
  Filled 2023-05-19: qty 1

## 2023-05-19 MED ORDER — LIDOCAINE VISCOUS HCL 2 % MT SOLN
OROMUCOSAL | Status: AC
  Filled 2023-05-19: qty 15

## 2023-05-19 MED ORDER — ONDANSETRON 4 MG PO TBDP
4.0000 mg | ORAL_TABLET | Freq: Once | ORAL | Status: AC
  Administered 2023-05-19: 4 mg via ORAL

## 2023-05-19 MED ORDER — SIMETHICONE 40 MG/0.6ML PO SUSP: 40.0000 mg | Freq: Three times a day (TID) | ORAL | 0 refills | Status: DC

## 2023-05-19 MED ORDER — AMOXICILLIN-POT CLAVULANATE 875-125 MG PO TABS: 1.0000 | ORAL_TABLET | Freq: Two times a day (BID) | ORAL | 0 refills | Status: DC

## 2023-05-19 MED ORDER — ALUM & MAG HYDROXIDE-SIMETH 200-200-20 MG/5ML PO SUSP
30.0000 mL | Freq: Once | ORAL | Status: AC
  Administered 2023-05-19: 30 mL via ORAL

## 2023-05-19 NOTE — ED Provider Notes (Addendum)
[facility]    CSN: [id number] Arrival date & time: 05/19/23  4098      History   Chief Complaint Chief Complaint  Patient presents with   Headache    HPI Pamela Roberson is a 67 y.o. female.   Patient presents to clinic for complaints of acid reflux, emesis, right ear pain, headache, generalized weakness and fatigue, and sinus pressure that has been ongoing for the past week.  Reports her acid reflux has been bothersome for the past 3 weeks.  She is on pantoprazole and omeprazole, taking as prescribed.  She reports her acid reflux is so bad that she cannot eat without vomiting.  The last thing she tried to eat was oatmeal yesterday, which she subsequently vomited back up.  She denies any blood in her emesis.  Last time she was able to keep down food was 3 days prior.  She denies any diarrhea.  She has been having right ear pain, headache, chills and sinus pressure for the past week.  Has had postnasal drip.  She has had congestion and sneezing.    The history is provided by the patient and medical records.  Headache Associated symptoms: congestion, cough, fatigue, nausea, sinus pressure and vomiting   Associated symptoms: no abdominal pain, no diarrhea and no sore throat     Past Medical History:  Diagnosis Date   Arthritis    Asthma    Carotid stenosis, bilateral    Carpal tunnel syndrome    Chronic low back pain    COPD (chronic obstructive pulmonary disease) (HCC)    Diabetes mellitus without complication (HCC)    type II    Diabetic neuropathy (HCC)    Diverticulitis    Dyspnea    mild    Frequent falls    GERD (gastroesophageal reflux disease)    History of bronchitis    Hypercholesterolemia    Hypertension    IBS (irritable bowel syndrome)    Osteoarthritis    knee   Palpitations    Right ventricular enlargement    per pt report   Sleep apnea    mild but no sleep apnea    TMJ (dislocation of temporomandibular joint)    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    Vitamin D deficiency     Patient Active Problem List   Diagnosis Date Noted   Pain in left knee 02/15/2022   Sciatica, left side 02/15/2022   Pain in left shoulder 11/16/2021   Internal hemorrhoids 11/03/2021   Leg cramps 11/03/2021   Myalgia 11/03/2021   Adenomatous polyp of colon 12/04/2020   Anal fissure 12/04/2020   Chronic idiopathic constipation 12/04/2020   Constipation 12/04/2020   Dysphagia 12/04/2020   Gastroesophageal reflux disease 12/04/2020   Rectal pain 12/04/2020   Right upper quadrant pain 12/04/2020   Dizzy 12/11/2017   Pharyngeal dysphagia 12/11/2017   Post-nasal drip 12/11/2017   Referred ear pain, bilateral 12/11/2017   Diabetes mellitus without complication (HCC) 06/29/2017   Family history of glaucoma 06/29/2017   Hyperopia of both eyes with astigmatism and presbyopia 06/29/2017   Keratoconjunctivitis sicca of both eyes not specified as Sjogren's 06/29/2017   Nuclear sclerotic cataract of both eyes 06/29/2017   Vitreous floater, bilateral 06/29/2017   Biceps tendinitis of right shoulder 04/05/2017   Impingement syndrome of right shoulder 04/05/2017   Arthritis of right acromioclavicular joint 04/05/2017   Partial nontraumatic tear of rotator cuff, right 04/05/2017    Past  Surgical History:  Procedure Laterality Date   ABDOMINAL HYSTERECTOMY  1993   CARPAL TUNNEL RELEASE Right    CERVICAL DISC SURGERY  04/29/2015   Dr [place]rie [place]   ESOPHAGEAL MANOMETRY N/A 03/03/2021   Procedure: ESOPHAGEAL MANOMETRY (EM);  Surgeon: Charlott Rakes, MD;  Location: [facility];  Service: Endoscopy;  Laterality: N/A;   ESOPHAGOGASTRODUODENOSCOPY ENDOSCOPY     with esophagus being stretched twice per pt,    EYE SURGERY     fatty 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      feeding tube placement and removal   2015   NASAL SEPTUM SURGERY     ROTATOR CUFF REPAIR Right    spurs removed from esophagus   2015   TONSILLECTOMY      OB  History   No obstetric history on file.      Home Medications    Prior to Admission medications   Medication Sig Start Date End Date Taking? Authorizing Provider  amoxicillin-clavulanate (AUGMENTIN) 875-125 MG tablet Take 1 tablet by mouth every 12 (twelve) hours. 05/19/23  Yes Rinaldo Ratel, [place] N, FNP  ondansetron (ZOFRAN-ODT) 4 MG disintegrating tablet Take 1 tablet (4 mg total) by mouth every 8 (eight) hours as needed for nausea or vomiting. 05/19/23  Yes Rinaldo Ratel, [place] N, FNP  simethicone [facility]) 40 MG/0.6ML drops Take 0.6 mLs (40 mg total) by mouth with breakfast, with lunch, and with evening meal. 05/19/23  Yes Rinaldo Ratel, [place] N, FNP  acyclovir (ZOVIRAX) 400 MG tablet Take 1 tablet (400 mg total) by mouth 2 (two) times daily. 04/07/23   Meriam Sprague, MD  ADVAIR [facility] 115-21 MCG/ACT inhaler INHALE 2 PUFFS INTO THE LUNGS TWICE DAILY 02/27/23   Marcelyn Bruins, MD  albuterol [facility] HFA) 108 (90 Base) MCG/ACT inhaler Inhale 1-2 puffs into the lungs every 6 (six) hours as needed for wheezing or shortness of breath. 01/12/23   Marcelyn Bruins, MD  albuterol (PROVENTIL) (2.5 MG/3ML) 0.083% nebulizer solution Take 3 mLs (2.5 mg total) by nebulization every 4 (four) hours as needed for wheezing or shortness of breath. 02/24/23   Padgett, Pilar Grammes, MD  amLODipine (NORVASC) 5 MG tablet Take 1 tablet (5 mg total) by mouth daily. 04/07/23   Meriam Sprague, MD  Artificial Tear Ointment (DRY EYES OP) Place 1-2 drops into both eyes daily as needed (for dry eyes).     [provider]  ascorbic acid (VITAMIN C) 1000 MG tablet Take 1 tablet by mouth daily.    [provider]  atorvastatin (LIPITOR) 10 MG tablet Take 1 tablet (10 mg total) by mouth daily. 04/07/23   Meriam Sprague, MD  azelastine (OPTIVAR) 0.05 % ophthalmic solution Place 1 drop into both eyes 2 (two) times daily. 02/24/23   Marcelyn Bruins, MD  Blood Glucose Calibration  (TRUE METRIX LEVEL 1) Low SOLN  09/20/21   [provider]  Blood Glucose Monitoring Suppl (TRUE METRIX METER) w/Device KIT  09/20/21   [provider]  Carbinoxamine Maleate 4 MG TABS Take 1 tablet (4 mg total) by mouth 2 (two) times daily. 02/24/23   Marcelyn Bruins, MD  cholecalciferol (VITAMIN D3) 25 MCG (1000 UNIT) tablet Take 2,000 Units by mouth daily.    [provider]  cycloSPORINE (RESTASIS) 0.05 % ophthalmic emulsion Place 1 drop into both eyes 2 times daily. 03/08/22   [provider]  EPINEPHrine 0.3 mg/0.3 mL IJ SOAJ injection INJECT ONE SYRINGE INTO THE MUSCLE AS NEEDED FOR  ANAPHYLAXIS 08/24/22   Marcelyn Bruins, MD  famotidine (PEPCID) 20 MG tablet 20 mg 1-2 times daily. 01/12/23   Marcelyn Bruins, MD  fluticasone (FLONASE) 50 MCG/ACT nasal spray Place 1 spray into both nostrils daily as needed for allergies or rhinitis.    [provider]  gabapentin (NEURONTIN) 300 MG capsule Take 300 mg by mouth 3 (three) times daily.    [provider]  guaiFENesin (ROBITUSSIN) 100 MG/5ML liquid Take 5-10 mLs (100-200 mg total) by mouth every 4 (four) hours as needed for cough or to loosen phlegm. 01/05/23   [place], Acie Fredrickson, FNP  JANUMET XR 50-500 MG TB24 Take 1 tablet by mouth daily. 04/23/23   [name], FNP  levocetirizine (XYZAL) 5 MG tablet Take 1 tablet (5 mg total) by mouth every evening. 01/12/23   Marcelyn Bruins, MD  metoprolol succinate (TOPROL-XL) 25 MG 24 hr tablet Take 1 tablet (25 mg total) by mouth daily. 04/07/23   Meriam Sprague, MD  montelukast (SINGULAIR) 10 MG tablet Take 1 tablet (10 mg total) by mouth daily. 01/12/23   Marcelyn Bruins, MD  Multiple Vitamin (MULTIVITAMIN WITH MINERALS) TABS tablet Take 1 tablet by mouth daily.    [provider]  mupirocin ointment (BACTROBAN) 2 % Apply 1 Application topically 2 (two) times daily. To affected area till better 02/21/23    Zenia Resides, MD  pantoprazole (PROTONIX) 40 MG tablet Take 1 tablet (40 mg total) by mouth 2 (two) times daily. 01/12/23   Marcelyn Bruins, MD  promethazine-dextromethorphan (PROMETHAZINE-DM) 6.25-15 MG/5ML syrup Take 5 mLs by mouth 3 (three) times daily as needed for cough. 03/03/23   Alverda Nazzaro, [place] N, FNP  sucralfate (CARAFATE) 1 g tablet Take 1 g by mouth 4 (four) times daily.  02/11/20   [provider]  TRUE METRIX BLOOD GLUCOSE TEST test strip 1 each 3 (three) times daily. 09/20/21   [provider]  TRUEplus Lancets 33G MISC Apply 1 each topically 3 (three) times daily. 09/20/21   [provider]  esomeprazole (NEXIUM) 40 MG capsule Take 1 capsule (40 mg total) by mouth daily. Patient not taking: Reported on 07/08/2020 04/24/19 08/06/20  Arby Barrette, MD    Family History Family History  Problem Relation Age of Onset   Allergic rhinitis Mother    Diabetes Father        type 2   Heart disease Father    Hypertension Father    Allergic rhinitis Father    Allergic rhinitis Sister    Collagen disease Sister    Heart disease Brother    Hypertension Brother    Angioedema Neg Hx    Asthma Neg Hx    Atopy Neg Hx    Eczema Neg Hx    Immunodeficiency Neg Hx    Urticaria Neg Hx     Social History Social History   Tobacco Use   Smoking status: Former    Packs/day: 0.50    Years: 30.00    Additional pack years: 0.00    Total pack years: 15.00    Types: Cigarettes    Quit date: 12/08/1994    Years since quitting: 28.4   Smokeless tobacco: Never  Vaping Use   Vaping Use: Never used  Substance Use Topics   Alcohol use: No   Drug use: No     Allergies   Crab [shellfish allergy], Erythromycin, Hm lidocaine patch [lidocaine], Erdafitinib, Fish allergy, Metoclopramide, Sulfacetamide, Sulfacetamide sodium-sulfur, Doxycycline, Levaquin [levofloxacin in  d5w], Morphine, and Sulfa antibiotics   Review of Systems Review of Systems   Constitutional:  Positive for fatigue.  HENT:  Positive for congestion, sinus pressure and sinus pain. Negative for sore throat and trouble swallowing.   Respiratory:  Positive for cough. Negative for shortness of breath and wheezing.   Cardiovascular:  Negative for chest pain.  Gastrointestinal:  Positive for nausea and vomiting. Negative for abdominal pain and diarrhea.  Neurological:  Positive for headaches.     Physical Exam Triage Vital Signs ED Triage Vitals  Enc Vitals Group     BP 05/19/23 0927 (!) 139/91     Pulse Rate 05/19/23 0927 94     Resp 05/19/23 0927 18     Temp 05/19/23 0927 98.3 F (36.8 C)     Temp Source 05/19/23 0927 Oral     SpO2 05/19/23 0927 97 %     Weight --      Height --      Head Circumference --      Peak Flow --      Pain Score 05/19/23 0928 7     Pain Loc --      Pain Edu? --      Excl. in GC? --    No data found.  Updated Vital Signs BP (!) 139/91 (BP Location: Left Arm)   Pulse 94   Temp 98.3 F (36.8 C) (Oral)   Resp 18   SpO2 97%   Visual Acuity Right Eye Distance:   Left Eye Distance:   Bilateral Distance:    Right Eye Near:   Left Eye Near:    Bilateral Near:     Physical Exam Vitals and nursing note reviewed.  Constitutional:      Appearance: Normal appearance. She is well-developed.  HENT:     Head: Normocephalic and atraumatic.     Right Ear: Ear canal normal. Tympanic membrane is erythematous.     Left Ear: Tympanic membrane, ear canal and external ear normal.     Nose: Congestion and rhinorrhea present.     Right Sinus: Maxillary sinus tenderness present.     Left Sinus: Maxillary sinus tenderness present.     Mouth/Throat:     Mouth: Mucous membranes are moist.     Pharynx: Posterior oropharyngeal erythema present.  Eyes:     General: No scleral icterus.    Pupils: Pupils are equal, round, and reactive to light.  Cardiovascular:     Rate and Rhythm: Normal rate and regular rhythm.     Heart sounds:  Normal heart sounds. No murmur heard. Pulmonary:     Effort: Pulmonary effort is normal. No respiratory distress.     Breath sounds: Normal breath sounds.  Musculoskeletal:     Cervical back: Normal range of motion.  Neurological:     Mental Status: She is alert.  Psychiatric:        Behavior: Behavior is cooperative.      UC Treatments / Results  Labs (all labs ordered are listed, but only abnormal results are displayed) Labs Reviewed  BASIC METABOLIC PANEL  CBC    EKG   Radiology No results found.  Procedures Procedures (including critical care time)  Medications Ordered in UC Medications  ondansetron (ZOFRAN-ODT) disintegrating tablet 4 mg (4 mg Oral Given 05/19/23 0948)  alum & mag hydroxide-simeth (MAALOX/MYLANTA) 200-200-20 MG/5ML suspension 30 mL (30 mLs Oral Given 05/19/23 0948)  lidocaine (XYLOCAINE) 2 % viscous mouth solution 15 mL (15 mLs Mouth/Throat Given  05/19/23 0948)    Initial Impression / Assessment and Plan / UC Course  I have reviewed the triage vital signs and the nursing notes.  Pertinent labs & imaging results that were available during my care of the patient were reviewed by me and considered in my medical decision making (see chart for details).  Vitals and triage reviewed, patient is hemodynamically stable.  1 week of sinus pain and pressure, maxillary sinus tenderness on physical exam with congestion, rhinorrhea and postnasal drip.  Will cover with Augmentin for acute bacterial sinusitis.  Reports emesis with food intake, and severe acid reflux.  Trial GI cocktail in clinic as well as ODT Zofran, patient reports nausea and stomach pain are much improved.  CBC and CMP obtained to evaluate electrolytes and metabolic status after decreased p.o. intake and emesis.  Tolerating sips of water in clinic.  Encouraged to follow-up with PCP regarding acid reflux management, provided with dietary guidelines for acid reflux.  Advised simethicone with  meals.  Plan of care, follow-up care and return precautions given, no questions at this time.     Final Clinical Impressions(s) / UC Diagnoses   Final diagnoses:  Acute bacterial sinusitis  Gastroesophageal reflux disease with esophagitis without hemorrhage     Discharge Instructions      Your symptoms are consistent with a bacterial sinus infection, please take antibiotics as prescribed and until finished.  Please take them with food to help prevent gastrointestinal upset.  Please take the simethicone drops prior to meals.  You can use the nausea medicine every 8 hours as needed for nausea and vomiting.  Please ensure you are drinking at least 64 ounces of water daily.  Please restart back with a bland diet like bananas, rice, toast, applesauce, boiled chicken and other bland foods.  Avoid fried or spicy foods.  Follow-up with your primary care regarding ongoing management of your acid reflux.  Please return to clinic for any new or urgent symptoms.      ED Prescriptions     Medication Sig Dispense Auth. Provider   simethicone (MYLICON) 40 MG/0.6ML drops Take 0.6 mLs (40 mg total) by mouth with breakfast, with lunch, and with evening meal. 30 mL Rinaldo Ratel, [place] N, FNP   amoxicillin-clavulanate (AUGMENTIN) 875-125 MG tablet Take 1 tablet by mouth every 12 (twelve) hours. 14 tablet Rinaldo Ratel, [place] N, FNP   ondansetron (ZOFRAN-ODT) 4 MG disintegrating tablet Take 1 tablet (4 mg total) by mouth every 8 (eight) hours as needed for nausea or vomiting. 20 tablet Tarris Delbene, [place] N, [place]      PDMP not reviewed this encounter.   Shelton Soler, [place] N, FNP 05/19/23 1031    Lachandra Dettmann, [place] N, [place] 05/19/23 1032

## 2023-05-19 NOTE — ED Triage Notes (Signed)
Pt c/o rt ear pain, headache, chills, and weakness x1wk. States taking tylenol with little relief.

## 2023-05-19 NOTE — Discharge Instructions (Addendum)
Your symptoms are consistent with a bacterial sinus infection, please take antibiotics as prescribed and until finished.  Please take them with food to help prevent gastrointestinal upset.  Please take the simethicone drops prior to meals.  You can use the nausea medicine every 8 hours as needed for nausea and vomiting.  Please ensure you are drinking at least 64 ounces of water daily.  Please restart back with a bland diet like bananas, rice, toast, applesauce, boiled chicken and other bland foods.  Avoid fried or spicy foods.  Follow-up with your primary care regarding ongoing management of your acid reflux.  Please return to clinic for any new or urgent symptoms.

## 2023-05-25 ENCOUNTER — Ambulatory Visit (INDEPENDENT_AMBULATORY_CARE_PROVIDER_SITE_OTHER): Payer: 59

## 2023-05-25 VITALS — Ht 66.0 in | Wt 203.0 lb

## 2023-05-25 DIAGNOSIS — Z0000 Encounter for general adult medical examination without abnormal findings: Secondary | ICD-10-CM | POA: Diagnosis not present

## 2023-05-25 DIAGNOSIS — E2839 Other primary ovarian failure: Secondary | ICD-10-CM

## 2023-05-25 NOTE — Patient Instructions (Signed)
Pamela Roberson , Thank you for taking time to come for your Medicare Wellness Visit. I appreciate your ongoing commitment to your health goals. Please review the following plan we discussed and let me know if I can assist you in the future.   These are the goals we discussed:  Goals   None     This is a list of the screening recommended for you and due dates:  Health Maintenance  Topic Date Due   Medicare Annual Wellness Visit  Never done   Eye exam for diabetics  Never done   Colon Cancer Screening  Never done   Pneumonia Vaccine (2 of 2 - PPSV23 or PCV20) 11/15/2019   COVID-19 Vaccine (3 - Moderna risk series) 02/25/2021   DEXA scan (bone density measurement)  Never done   Zoster (Shingles) Vaccine (1 of 2) 07/07/2023*   Flu Shot  07/06/2023   Hemoglobin A1C  10/07/2023   Complete foot exam   03/19/2024   Yearly kidney health urinalysis for diabetes  04/05/2024   Yearly kidney function blood test for diabetes  05/18/2024   Mammogram  05/04/2025   DTaP/Tdap/Td vaccine (2 - Td or Tdap) 05/29/2029   Hepatitis C Screening  Completed   HPV Vaccine  Aged Out  *Topic was postponed. The date shown is not the original due date.    Advanced directives: Please bring a copy of your POA (Power of Attorney) and/or Living Will to your next appointment.   Conditions/risks identified: none  Next appointment: Follow up in one year for your annual wellness visit   You have an order for:  []   2D Mammogram  []   3D Mammogram  [x]   Bone Density     Please call for appointment:  [facility] [address] [place], [place] [address] [phone]     Preventive Care 65 Years and Older, Female Preventive care refers to lifestyle choices and visits with your health care provider that can promote health and wellness. What does preventive care include? A yearly physical exam. This is also called an annual well check. Dental exams once or twice a year. Routine eye  exams. Ask your health care provider how often you should have your eyes checked. Personal lifestyle choices, including: Daily care of your teeth and gums. Regular physical activity. Eating a healthy diet. Avoiding tobacco and drug use. Limiting alcohol use. Practicing safe sex. Taking low-dose aspirin every day. Taking vitamin and mineral supplements as recommended by your health care provider. What happens during an annual well check? The services and screenings done by your health care provider during your annual well check will depend on your age, overall health, lifestyle risk factors, and family history of disease. Counseling  Your health care provider may ask you questions about your: Alcohol use. Tobacco use. Drug use. Emotional well-being. Home and relationship well-being. Sexual activity. Eating habits. History of falls. Memory and ability to understand (cognition). Work and work Astronomer. Reproductive health. Screening  You may have the following tests or measurements: Height, weight, and BMI. Blood pressure. Lipid and cholesterol levels. These may be checked every 5 years, or more frequently if you are over 68 years old. Skin check. Lung cancer screening. You may have this screening every year starting at age 27 if you have a 30-pack-year history of smoking and currently smoke or have quit within the past 15 years. Fecal occult blood test (FOBT) of the stool. You may have this test every year starting at  age 2. Flexible sigmoidoscopy or colonoscopy. You may have a sigmoidoscopy every 5 years or a colonoscopy every 10 years starting at age 96. Hepatitis C blood test. Hepatitis B blood test. Sexually transmitted disease (STD) testing. Diabetes screening. This is done by checking your blood sugar (glucose) after you have not eaten for a while (fasting). You may have this done every 1-3 years. Bone density scan. This is done to screen for osteoporosis. You may have  this done starting at age 30. Mammogram. This may be done every 1-2 years. Talk to your health care provider about how often you should have regular mammograms. Talk with your health care provider about your test results, treatment options, and if necessary, the need for more tests. Vaccines  Your health care provider may recommend certain vaccines, such as: Influenza vaccine. This is recommended every year. Tetanus, diphtheria, and acellular pertussis (Tdap, Td) vaccine. You may need a Td booster every 10 years. Zoster vaccine. You may need this after age 62. Pneumococcal 13-valent conjugate (PCV13) vaccine. One dose is recommended after age 11. Pneumococcal polysaccharide (PPSV23) vaccine. One dose is recommended after age 52. Talk to your health care provider about which screenings and vaccines you need and how often you need them. This information is not intended to replace advice given to you by your health care provider. Make sure you discuss any questions you have with your health care provider. Document Released: 12/18/2015 Document Revised: 08/10/2016 Document Reviewed: 09/22/2015 Elsevier Interactive Patient Education  2017 [place] Prevention in the Home Falls can cause injuries. They can happen to people of all ages. There are many things you can do to make your home safe and to help prevent falls. What can I do on the outside of my home? Regularly fix the edges of walkways and driveways and fix any cracks. Remove anything that might make you trip as you walk through a door, such as a raised step or threshold. Trim any bushes or trees on the path to your home. Use bright outdoor lighting. Clear any walking paths of anything that might make someone trip, such as rocks or tools. Regularly check to see if handrails are loose or broken. Make sure that both sides of any steps have handrails. Any raised decks and porches should have guardrails on the edges. Have any leaves,  snow, or ice cleared regularly. Use sand or salt on walking paths during winter. Clean up any spills in your garage right away. This includes oil or grease spills. What can I do in the bathroom? Use night lights. Install grab bars by the toilet and in the tub and shower. Do not use towel bars as grab bars. Use non-skid mats or decals in the tub or shower. If you need to sit down in the shower, use a plastic, non-slip stool. Keep the floor dry. Clean up any water that spills on the floor as soon as it happens. Remove soap buildup in the tub or shower regularly. Attach bath mats securely with double-sided non-slip rug tape. Do not have throw rugs and other things on the floor that can make you trip. What can I do in the bedroom? Use night lights. Make sure that you have a light by your bed that is easy to reach. Do not use any sheets or blankets that are too big for your bed. They should not hang down onto the floor. Have a firm chair that has side arms. You can use this for support while  you get dressed. Do not have throw rugs and other things on the floor that can make you trip. What can I do in the kitchen? Clean up any spills right away. Avoid walking on wet floors. Keep items that you use a lot in easy-to-reach places. If you need to reach something above you, use a strong step stool that has a grab bar. Keep electrical cords out of the way. Do not use floor polish or wax that makes floors slippery. If you must use wax, use non-skid floor wax. Do not have throw rugs and other things on the floor that can make you trip. What can I do with my stairs? Do not leave any items on the stairs. Make sure that there are handrails on both sides of the stairs and use them. Fix handrails that are broken or loose. Make sure that handrails are as long as the stairways. Check any carpeting to make sure that it is firmly attached to the stairs. Fix any carpet that is loose or worn. Avoid having throw  rugs at the top or bottom of the stairs. If you do have throw rugs, attach them to the floor with carpet tape. Make sure that you have a light switch at the top of the stairs and the bottom of the stairs. If you do not have them, ask someone to add them for you. What else can I do to help prevent falls? Wear shoes that: Do not have high heels. Have rubber bottoms. Are comfortable and fit you well. Are closed at the toe. Do not wear sandals. If you use a stepladder: Make sure that it is fully opened. Do not climb a closed stepladder. Make sure that both sides of the stepladder are locked into place. Ask someone to hold it for you, if possible. Clearly mark and make sure that you can see: Any grab bars or handrails. First and last steps. Where the edge of each step is. Use tools that help you move around (mobility aids) if they are needed. These include: Canes. Walkers. Scooters. Crutches. Turn on the lights when you go into a dark area. Replace any light bulbs as soon as they burn out. Set up your furniture so you have a clear path. Avoid moving your furniture around. If any of your floors are uneven, fix them. If there are any pets around you, be aware of where they are. Review your medicines with your doctor. Some medicines can make you feel dizzy. This can increase your chance of falling. Ask your doctor what other things that you can do to help prevent falls. This information is not intended to replace advice given to you by your health care provider. Make sure you discuss any questions you have with your health care provider. Document Released: 09/17/2009 Document Revised: 04/28/2016 Document Reviewed: 12/26/2014 Elsevier Interactive Patient Education  2017 [employer].

## 2023-05-25 NOTE — Progress Notes (Signed)
Subjective:   Pamela Roberson is a 67 y.o. female who presents for Medicare Annual (Subsequent) preventive examination.  Visit Complete: Virtual  I connected with  Pamela Roberson on 05/25/23 by a audio enabled telemedicine application and verified that I am speaking with the correct person using two identifiers.  Patient Location: Home  Provider Location: Office/Clinic  I discussed the limitations of evaluation and management by telemedicine. The patient expressed understanding and agreed to proceed.   Review of Systems     Cardiac Risk Factors include: advanced age (>12men, >70 women);diabetes mellitus;obesity (BMI >30kg/m2)     Objective:    Today's Vitals   05/25/23 1026 05/25/23 1027  Weight: 203 lb (92.1 kg)   Height: 5\' 6"  (1.676 m)   PainSc:  7    Body mass index is 32.77 kg/m.     05/25/2023   10:44 AM 12/28/2022   12:32 PM 04/11/2022    1:30 AM 03/06/2022    8:13 PM 01/24/2022    5:14 PM 05/04/2021    9:34 PM 04/19/2021    8:08 PM  Advanced Directives  Does Patient Have a Medical Advance Directive? Yes No No No No No No  Type of Estate agent of [place];Living will        Copy of Healthcare Power of Attorney in Chart? No - copy requested        Would patient like information on creating a medical advance directive?   No - Patient declined No - Patient declined No - Patient declined No - Patient declined No - Patient declined    Current Medications (verified) Outpatient Encounter Medications as of 05/25/2023  Medication Sig   acyclovir (ZOVIRAX) 400 MG tablet Take 1 tablet (400 mg total) by mouth 2 (two) times daily.   ADVAIR HFA 115-21 MCG/ACT inhaler INHALE 2 PUFFS INTO THE LUNGS TWICE DAILY   albuterol (PROAIR HFA) 108 (90 Base) MCG/ACT inhaler Inhale 1-2 puffs into the lungs every 6 (six) hours as needed for wheezing or shortness of breath.   albuterol (PROVENTIL) (2.5 MG/3ML) 0.083% nebulizer solution Take 3 mLs (2.5 mg total)  by nebulization every 4 (four) hours as needed for wheezing or shortness of breath.   amLODipine (NORVASC) 5 MG tablet Take 1 tablet (5 mg total) by mouth daily.   Artificial Tear Ointment (DRY EYES OP) Place 1-2 drops into both eyes daily as needed (for dry eyes).    ascorbic acid (VITAMIN C) 1000 MG tablet Take 1 tablet by mouth daily.   atorvastatin (LIPITOR) 10 MG tablet Take 1 tablet (10 mg total) by mouth daily.   azelastine (OPTIVAR) 0.05 % ophthalmic solution Place 1 drop into both eyes 2 (two) times daily.   Blood Glucose Calibration (TRUE METRIX LEVEL 1) Low SOLN    Blood Glucose Monitoring Suppl (TRUE METRIX METER) w/Device KIT    Carbinoxamine Maleate 4 MG TABS Take 1 tablet (4 mg total) by mouth 2 (two) times daily.   cholecalciferol (VITAMIN D3) 25 MCG (1000 UNIT) tablet Take 2,000 Units by mouth daily.   cycloSPORINE (RESTASIS) 0.05 % ophthalmic emulsion Place 1 drop into both eyes 2 times daily.   EPINEPHrine 0.3 mg/0.3 mL IJ SOAJ injection INJECT ONE SYRINGE INTO THE MUSCLE AS NEEDED FOR ANAPHYLAXIS   famotidine (PEPCID) 20 MG tablet 20 mg 1-2 times daily.   fluticasone (FLONASE) 50 MCG/ACT nasal spray Place 1 spray into both nostrils daily as needed for allergies or rhinitis.   guaiFENesin (ROBITUSSIN) 100  MG/5ML liquid Take 5-10 mLs (100-200 mg total) by mouth every 4 (four) hours as needed for cough or to loosen phlegm.   JANUMET XR 50-500 MG TB24 Take 1 tablet by mouth daily.   levocetirizine (XYZAL) 5 MG tablet Take 1 tablet (5 mg total) by mouth every evening.   metoprolol succinate (TOPROL-XL) 25 MG 24 hr tablet Take 1 tablet (25 mg total) by mouth daily.   montelukast (SINGULAIR) 10 MG tablet Take 1 tablet (10 mg total) by mouth daily.   Multiple Vitamin (MULTIVITAMIN WITH MINERALS) TABS tablet Take 1 tablet by mouth daily.   mupirocin ointment (BACTROBAN) 2 % Apply 1 Application topically 2 (two) times daily. To affected area till better   ondansetron (ZOFRAN-ODT) 4 MG  disintegrating tablet Take 1 tablet (4 mg total) by mouth every 8 (eight) hours as needed for nausea or vomiting.   pantoprazole (PROTONIX) 40 MG tablet Take 1 tablet (40 mg total) by mouth 2 (two) times daily.   sucralfate (CARAFATE) 1 g tablet Take 1 g by mouth 4 (four) times daily.    TRUE METRIX BLOOD GLUCOSE TEST test strip 1 each 3 (three) times daily.   TRUEplus Lancets 33G MISC Apply 1 each topically 3 (three) times daily.   amoxicillin-clavulanate (AUGMENTIN) 875-125 MG tablet Take 1 tablet by mouth every 12 (twelve) hours. (Patient not taking: Reported on 05/25/2023)   gabapentin (NEURONTIN) 300 MG capsule Take 300 mg by mouth 3 (three) times daily. (Patient not taking: Reported on 05/25/2023)   promethazine-dextromethorphan (PROMETHAZINE-DM) 6.25-15 MG/5ML syrup Take 5 mLs by mouth 3 (three) times daily as needed for cough. (Patient not taking: Reported on 05/25/2023)   simethicone (MYLICON) 40 MG/0.6ML drops Take 0.6 mLs (40 mg total) by mouth with breakfast, with lunch, and with evening meal. (Patient not taking: Reported on 05/25/2023)   [DISCONTINUED] esomeprazole (NEXIUM) 40 MG capsule Take 1 capsule (40 mg total) by mouth daily. (Patient not taking: Reported on 07/08/2020)   No facility-administered encounter medications on file as of 05/25/2023.    Allergies (verified) Crab [shellfish allergy], Erythromycin, Hm lidocaine patch [lidocaine], Erdafitinib, Fish allergy, Metoclopramide, Sulfacetamide, Sulfacetamide sodium-sulfur, Doxycycline, Levaquin [levofloxacin in d5w], Morphine, and Sulfa antibiotics   History: Past Medical History:  Diagnosis Date   Arthritis    Asthma    Carotid stenosis, bilateral    Carpal tunnel syndrome    Chronic low back pain    COPD (chronic obstructive pulmonary disease) (HCC)    Diabetes mellitus without complication (HCC)    type II    Diabetic neuropathy (HCC)    Diverticulitis    Dyspnea    mild    Frequent falls    GERD (gastroesophageal  reflux disease)    History of bronchitis    Hypercholesterolemia    Hypertension    IBS (irritable bowel syndrome)    Osteoarthritis    knee   Palpitations    Right ventricular enlargement    per pt report   Sleep apnea    mild but no sleep apnea    TMJ (dislocation of temporomandibular joint)    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    Vitamin D deficiency    Past Surgical History:  Procedure Laterality Date   ABDOMINAL HYSTERECTOMY  1993   CARPAL TUNNEL RELEASE Right    CERVICAL DISC SURGERY  04/29/2015   [place]   ESOPHAGEAL MANOMETRY N/A 03/03/2021   Procedure: ESOPHAGEAL MANOMETRY (EM);  Surgeon: Charlott Rakes, MD;  Location: [facility];  Service: Endoscopy;  Laterality: N/A;   ESOPHAGOGASTRODUODENOSCOPY ENDOSCOPY     with esophagus being stretched twice per pt,    EYE SURGERY     fatty 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      feeding tube placement and removal   2015   NASAL SEPTUM SURGERY     ROTATOR CUFF REPAIR Right    spurs removed from esophagus   2015   TONSILLECTOMY     Family History  Problem Relation Age of Onset   Allergic rhinitis Mother    Diabetes Father        type 2   Heart disease Father    Hypertension Father    Allergic rhinitis Father    Allergic rhinitis Sister    Collagen disease Sister    Heart disease Brother    Hypertension Brother    Angioedema Neg Hx    Asthma Neg Hx    Atopy Neg Hx    Eczema Neg Hx    Immunodeficiency Neg Hx    Urticaria Neg Hx    Social History   Socioeconomic History   Marital status: Widowed    Spouse name: Not on file   Number of children: 3   Years of education: Not on file   Highest education level: 11th grade  Occupational History    Comment: NA  Tobacco Use   Smoking status: Former    Packs/day: 0.50    Years: 30.00    Additional pack years: 0.00    Total pack years: 15.00    Types: Cigarettes    Quit date: 12/08/1994    Years since quitting: 28.4   Smokeless tobacco:  Never  Vaping Use   Vaping Use: Never used  Substance and Sexual Activity   Alcohol use: No   Drug use: No   Sexual activity: Not on file  Other Topics Concern   Not on file  Social History Narrative   Lives with daughter   Caffeine- coffee 12 oz   Social Determinants of Health   Financial Resource Strain: Low Risk  (05/25/2023)   Overall Financial Resource Strain (CARDIA)    Difficulty of Paying Living Expenses: Not hard at all  Food Insecurity: No Food Insecurity (05/25/2023)   Hunger Vital Sign    Worried About Running Out of Food in the Last Year: Never true    Ran Out of Food in the Last Year: Never true  Transportation Needs: No Transportation Needs (05/25/2023)   PRAPARE - Administrator, Civil Service (Medical): No    Lack of Transportation (Non-Medical): No  Physical Activity: Sufficiently Active (05/25/2023)   Exercise Vital Sign    Days of Exercise per Week: 7 days    Minutes of Exercise per Session: 30 min  Stress: No Stress Concern Present (05/25/2023)   [employer] of Occupational Health - Occupational Stress Questionnaire    Feeling of Stress : Not at all  Social Connections: Not on file    Tobacco Counseling Counseling given: Not Answered   Clinical Intake:  Pre-visit preparation completed: Yes  Pain : 0-10 Pain Score: 7  Pain Location: Chest Pain Descriptors / Indicators: Burning Pain Onset: 1 to 4 weeks ago Pain Frequency: Constant     Nutritional Status: BMI > 30  Obese Nutritional Risks: Nausea/ vomitting/ diarrhea (had last week, but has resolved) Diabetes: Yes CBG done?: No Did pt. bring in CBG monitor from home?: No  How often do you need to  have someone help you when you read instructions, pamphlets, or other written materials from your doctor or pharmacy?: 1 - Never  Interpreter Needed?: No  Information entered by :: NAllen LPN   Activities of Daily Living    05/25/2023   10:33 AM  In your present state of  health, do you have any difficulty performing the following activities:  Hearing? 0  Vision? 1  Difficulty concentrating or making decisions? 1  Comment trouble with memory  Walking or climbing stairs? 1  Dressing or bathing? 0  Doing errands, shopping? 0  Preparing Food and eating ? N  Using the Toilet? N  In the past six months, have you accidently leaked urine? Y  Do you have problems with loss of bowel control? Y  Comment has IBS  Managing your Medications? N  Managing your Finances? N  Housekeeping or managing your Housekeeping? N    Patient Care Team: [name], FNP as PCP - General (General Practice) Meriam Sprague, MD as PCP - Cardiology (Cardiology)  Indicate any recent Medical Services you may have received from other than [facility] providers in the past year (date may be approximate).     Assessment:   This is a routine wellness examination for Pamela Roberson.  Hearing/Vision screen Vision Screening - Comments:: Regular eye exams, [facility]  Dietary issues and exercise activities discussed:     Goals Addressed             This Visit's Progress    Patient Stated       05/25/2023, wants to keep sugars down and heal       Depression Screen    05/25/2023   10:46 AM 04/06/2023   11:32 AM  PHQ 2/9 Scores  PHQ - 2 Score 0 0  PHQ- 9 Score  0    Fall Risk    05/25/2023   10:45 AM 04/06/2023   11:32 AM  Fall Risk   Falls in the past year? 0 0  Number falls in past yr: 0 0  Injury with Fall? 0 0  Risk for fall due to : Medication side effect;Impaired mobility;Impaired balance/gait No Fall Risks  Follow up Falls prevention discussed;Education provided;Falls evaluation completed Falls evaluation completed    MEDICARE RISK AT HOME:  Medicare Risk at Home - 05/25/23 1045     Any stairs in or around the home? No    If so, are there any without handrails? No    Home free of loose throw rugs in walkways, pet beds, electrical cords, etc? Yes    Adequate  lighting in your home to reduce risk of falls? Yes    Life alert? No    Use of a cane, walker or w/c? Yes    Grab bars in the bathroom? No    Shower chair or bench in shower? No    Elevated toilet seat or a handicapped toilet? No             TIMED UP AND GO:  Was the test performed?  No    Cognitive Function:        05/25/2023   10:46 AM  6CIT Screen  What Year? 0 points  What month? 0 points  What time? 0 points  Count back from 20 4 points  Months in reverse 0 points  Repeat phrase 6 points  Total Score 10 points    Immunizations Immunization History  Administered Date(s) Administered   Influenza,inj,Quad PF,6+ Mos 09/20/2019  Moderna Sars-Covid-2 Vaccination 12/17/2020, 01/28/2021   Pneumococcal Conjugate-13 09/20/2019   Tdap 05/30/2019    TDAP status: Up to date  Flu Vaccine status: Up to date  Pneumococcal vaccine status: Up to date  Covid-19 vaccine status: Completed vaccines  Qualifies for Shingles Vaccine? Yes   Zostavax completed No   Shingrix Completed?: No.    Education has been provided regarding the importance of this vaccine. Patient has been advised to call insurance company to determine out of pocket expense if they have not yet received this vaccine. Advised may also receive vaccine at local pharmacy or Health Dept. Verbalized acceptance and understanding.  Screening Tests Health Maintenance  Topic Date Due   OPHTHALMOLOGY EXAM  Never done   Colonoscopy  Never done   Pneumonia Vaccine 73+ Years old (2 of 2 - PPSV23 or PCV20) 11/15/2019   COVID-19 Vaccine (3 - Moderna risk series) 02/25/2021   DEXA SCAN  Never done   Zoster Vaccines- Shingrix (1 of 2) 07/07/2023 (Originally 11/03/1975)   INFLUENZA VACCINE  07/06/2023   HEMOGLOBIN A1C  10/07/2023   FOOT EXAM  03/19/2024   Diabetic kidney evaluation - Urine ACR  04/05/2024   Diabetic kidney evaluation - eGFR measurement  05/18/2024   Medicare Annual Wellness (AWV)  05/24/2024    MAMMOGRAM  05/04/2025   DTaP/Tdap/Td (2 - Td or Tdap) 05/29/2029   Hepatitis C Screening  Completed   HPV VACCINES  Aged Out    Health Maintenance  Health Maintenance Due  Topic Date Due   OPHTHALMOLOGY EXAM  Never done   Colonoscopy  Never done   Pneumonia Vaccine 72+ Years old (2 of 2 - PPSV23 or PCV20) 11/15/2019   COVID-19 Vaccine (3 - Moderna risk series) 02/25/2021   DEXA SCAN  Never done    Colorectal cancer screening: consultation on 06/01/2023  Mammogram status: Completed 05/05/2023. Repeat every year  Bone Density status: Ordered today. Pt provided with contact info and advised to call to schedule appt.  Lung Cancer Screening: (Low Dose CT Chest recommended if Age 52-80 years, 20 pack-year currently smoking OR have quit w/in 15years.) does not qualify.   Lung Cancer Screening Referral: no  Additional Screening:  Hepatitis C Screening: does qualify; Completed 04/06/2023  Vision Screening: Recommended annual ophthalmology exams for early detection of glaucoma and other disorders of the eye. Is the patient up to date with their annual eye exam?  No  Who is the provider or what is the name of the office in which the patient attends annual eye exams? [facility] If pt is not established with a provider, would they like to be referred to a provider to establish care? No .   Dental Screening: Recommended annual dental exams for proper oral hygiene  Diabetic Foot Exam: Diabetic Foot Exam: Completed 03/20/2023  Community Resource Referral / Chronic Care Management: CRR required this visit?  No   CCM required this visit?  No     Plan:     I have personally reviewed and noted the following in the patient's chart:   Medical and social history Use of alcohol, tobacco or illicit drugs  Current medications and supplements including opioid prescriptions. Patient is not currently taking opioid prescriptions. Functional ability and status Nutritional status Physical  activity Advanced directives List of other physicians Hospitalizations, surgeries, and ER visits in previous 12 months Vitals Screenings to include cognitive, depression, and falls Referrals and appointments  In addition, I have reviewed and discussed with patient certain preventive  protocols, quality metrics, and best practice recommendations. A written personalized care plan for preventive services as well as general preventive health recommendations were provided to patient.     Barb Merino, LPN   1/61/0960   After Visit Summary: (MyChart) Due to this being a telephonic visit, the after visit summary with patients personalized plan was offered to patient via MyChart   Nurse Notes: none

## 2023-06-07 DIAGNOSIS — E119 Type 2 diabetes mellitus without complications: Secondary | ICD-10-CM | POA: Diagnosis not present

## 2023-06-25 ENCOUNTER — Ambulatory Visit (HOSPITAL_COMMUNITY)
Admission: EM | Admit: 2023-06-25 | Discharge: 2023-06-25 | Disposition: A | Discharge status: Home / Self Care | Payer: 59 | Attending: Emergency Medicine | Admitting: Emergency Medicine

## 2023-06-25 ENCOUNTER — Encounter (HOSPITAL_COMMUNITY): Payer: Self-pay | Admitting: Emergency Medicine

## 2023-06-25 DIAGNOSIS — R21 Rash and other nonspecific skin eruption: Secondary | ICD-10-CM | POA: Diagnosis not present

## 2023-06-25 DIAGNOSIS — Z207 Contact with and (suspected) exposure to pediculosis, acariasis and other infestations: Secondary | ICD-10-CM | POA: Diagnosis not present

## 2023-06-25 MED ORDER — HYDROXYZINE HCL 25 MG PO TABS: 25.0000 mg | ORAL_TABLET | Freq: Four times a day (QID) | ORAL | 0 refills | Status: DC

## 2023-06-25 MED ORDER — PERMETHRIN 5 % EX CREA: TOPICAL_CREAM | CUTANEOUS | 1 refills | Status: DC

## 2023-06-25 NOTE — Discharge Instructions (Addendum)
Today you are being treated prophylactically for scabies due to close exposure  Apply permethrin cream all over the skin before bed, leave on overnight then wash off in the morning, repeat in 2 weeks  For persistent itching you may use hydroxyzine tablet every 6 hours as needed for comfort  May apply any topical products such as hydrocortisone, Benadryl or calamine lotion onto the skin to help soothe,   do not scratch as this can cause a secondary bacterial infection  After completion of treatment, wash all bedding and clothing worn in hot water, turn washing machine to hot to properly cleanse of any germs  You may follow-up at this urgent care as needed for any concerns

## 2023-06-25 NOTE — ED Triage Notes (Signed)
Pt c/o two areas one on RLE and one on LLE that are red and itchy.

## 2023-06-25 NOTE — ED Provider Notes (Signed)
[facility]    CSN: [id number] Arrival date & time: 06/25/23  1526      History   Chief Complaint Chief Complaint  Patient presents with   Rash    HPI Pamela Roberson is a 67 y.o. female.   Son has scabies, has been his care taker,   Rll, l thigh,   Itchy  Noticed last night    Past Medical History:  Diagnosis Date   Arthritis    Asthma    Carotid stenosis, bilateral    Carpal tunnel syndrome    Chronic low back pain    COPD (chronic obstructive pulmonary disease) (HCC)    Diabetes mellitus without complication (HCC)    type II    Diabetic neuropathy (HCC)    Diverticulitis    Dyspnea    mild    Frequent falls    GERD (gastroesophageal reflux disease)    History of bronchitis    Hypercholesterolemia    Hypertension    IBS (irritable bowel syndrome)    Osteoarthritis    knee   Palpitations    Right ventricular enlargement    per pt report   Sleep apnea    mild but no sleep apnea    TMJ (dislocation of temporomandibular joint)    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    Vitamin D deficiency     Patient Active Problem List   Diagnosis Date Noted   Pain in left knee 02/15/2022   Sciatica, left side 02/15/2022   Pain in left shoulder 11/16/2021   Internal hemorrhoids 11/03/2021   Leg cramps 11/03/2021   Myalgia 11/03/2021   Adenomatous polyp of colon 12/04/2020   Anal fissure 12/04/2020   Chronic idiopathic constipation 12/04/2020   Constipation 12/04/2020   Dysphagia 12/04/2020   Gastroesophageal reflux disease 12/04/2020   Rectal pain 12/04/2020   Right upper quadrant pain 12/04/2020   Dizzy 12/11/2017   Pharyngeal dysphagia 12/11/2017   Post-nasal drip 12/11/2017   Referred ear pain, bilateral 12/11/2017   Diabetes mellitus without complication (HCC) 06/29/2017   Family history of glaucoma 06/29/2017   Hyperopia of both eyes with astigmatism and presbyopia 06/29/2017   Keratoconjunctivitis sicca of both eyes not  specified as Sjogren's 06/29/2017   Nuclear sclerotic cataract of both eyes 06/29/2017   Vitreous floater, bilateral 06/29/2017   Biceps tendinitis of right shoulder 04/05/2017   Impingement syndrome of right shoulder 04/05/2017   Arthritis of right acromioclavicular joint 04/05/2017   Partial nontraumatic tear of rotator cuff, right 04/05/2017    Past Surgical History:  Procedure Laterality Date   ABDOMINAL HYSTERECTOMY  1993   CARPAL TUNNEL RELEASE Right    CERVICAL DISC SURGERY  04/29/2015   Dr [place]rie [place]   ESOPHAGEAL MANOMETRY N/A 03/03/2021   Procedure: ESOPHAGEAL MANOMETRY (EM);  Surgeon: Charlott Rakes, MD;  Location: [facility];  Service: Endoscopy;  Laterality: N/A;   ESOPHAGOGASTRODUODENOSCOPY ENDOSCOPY     with esophagus being stretched twice per pt,    EYE SURGERY     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      Home Medications    Prior to Admission medications   Medication  Sig Start Date End Date Taking? Authorizing Provider  acyclovir (ZOVIRAX) 400 MG tablet Take 1 tablet (400 mg total) by mouth 2 (two) times daily. 04/07/23   Meriam Sprague, MD  ADVAIR [facility] 115-21 MCG/ACT inhaler INHALE 2 PUFFS INTO THE LUNGS TWICE DAILY 02/27/23   Marcelyn Bruins, MD  albuterol [facility] HFA) 108 (90 Base) MCG/ACT inhaler Inhale 1-2 puffs into the lungs every 6 (six) hours as needed for wheezing or shortness of breath. 01/12/23   Marcelyn Bruins, MD  albuterol (PROVENTIL) (2.5 MG/3ML) 0.083% nebulizer solution Take 3 mLs (2.5 mg total) by nebulization every 4 (four) hours as needed for wheezing or shortness of breath. 02/24/23   Padgett, Pilar Grammes, MD  amLODipine (NORVASC) 5 MG tablet Take 1 tablet (5 mg total) by mouth daily. 04/07/23    Meriam Sprague, MD  amoxicillin-clavulanate (AUGMENTIN) 875-125 MG tablet Take 1 tablet by mouth every 12 (twelve) hours. Patient not taking: Reported on 05/25/2023 05/19/23   Garrison, [place] N, FNP  Artificial Tear Ointment (DRY EYES OP) Place 1-2 drops into both eyes daily as needed (for dry eyes).     [provider]  ascorbic acid (VITAMIN C) 1000 MG tablet Take 1 tablet by mouth daily.    [provider]  atorvastatin (LIPITOR) 10 MG tablet Take 1 tablet (10 mg total) by mouth daily. 04/07/23   Meriam Sprague, MD  azelastine (OPTIVAR) 0.05 % ophthalmic solution Place 1 drop into both eyes 2 (two) times daily. 02/24/23   Marcelyn Bruins, MD  Blood Glucose Calibration (TRUE METRIX LEVEL 1) Low SOLN  09/20/21   [provider]  Blood Glucose Monitoring Suppl (TRUE METRIX METER) w/Device KIT  09/20/21   [provider]  Carbinoxamine Maleate 4 MG TABS Take 1 tablet (4 mg total) by mouth 2 (two) times daily. 02/24/23   Marcelyn Bruins, MD  cholecalciferol (VITAMIN D3) 25 MCG (1000 UNIT) tablet Take 2,000 Units by mouth daily.    [provider]  cycloSPORINE (RESTASIS) 0.05 % ophthalmic emulsion Place 1 drop into both eyes 2 times daily. 03/08/22   [provider]  EPINEPHrine 0.3 mg/0.3 mL IJ SOAJ injection INJECT ONE SYRINGE INTO THE MUSCLE AS NEEDED FOR ANAPHYLAXIS 08/24/22   Marcelyn Bruins, MD  famotidine (PEPCID) 20 MG tablet 20 mg 1-2 times daily. 01/12/23   Marcelyn Bruins, MD  fluticasone (FLONASE) 50 MCG/ACT nasal spray Place 1 spray into both nostrils daily as needed for allergies or rhinitis.    [provider]  gabapentin (NEURONTIN) 300 MG capsule Take 300 mg by mouth 3 (three) times daily. Patient not taking: Reported on 05/25/2023    [provider]  guaiFENesin (ROBITUSSIN) 100 MG/5ML liquid Take 5-10 mLs (100-200 mg total) by mouth every 4 (four) hours as needed for  cough or to loosen phlegm. 01/05/23   Mound, Acie Fredrickson, FNP  JANUMET XR 50-500 MG TB24 Take 1 tablet by mouth daily. 04/23/23   [name], FNP  levocetirizine (XYZAL) 5 MG tablet Take 1 tablet (5 mg total) by mouth every evening. 01/12/23   Marcelyn Bruins, MD  metoprolol succinate (TOPROL-XL) 25 MG 24 hr tablet Take 1 tablet (25 mg total) by mouth daily. 04/07/23   Meriam Sprague, MD  montelukast (SINGULAIR) 10 MG tablet Take 1 tablet (10 mg total) by mouth daily. 01/12/23   Marcelyn Bruins, MD  Multiple Vitamin (MULTIVITAMIN WITH MINERALS) TABS tablet Take 1 tablet by mouth daily.  [provider]  mupirocin ointment (BACTROBAN) 2 % Apply 1 Application topically 2 (two) times daily. To affected area till better 02/21/23   Zenia Resides, MD  ondansetron (ZOFRAN-ODT) 4 MG disintegrating tablet Take 1 tablet (4 mg total) by mouth every 8 (eight) hours as needed for nausea or vomiting. 05/19/23   Garrison, [place] N, FNP  pantoprazole (PROTONIX) 40 MG tablet Take 1 tablet (40 mg total) by mouth 2 (two) times daily. 01/12/23   Marcelyn Bruins, MD  promethazine-dextromethorphan (PROMETHAZINE-DM) 6.25-15 MG/5ML syrup Take 5 mLs by mouth 3 (three) times daily as needed for cough. Patient not taking: Reported on 05/25/2023 03/03/23   Garrison, [place] N, FNP  simethicone [facility]) 40 MG/0.6ML drops Take 0.6 mLs (40 mg total) by mouth with breakfast, with lunch, and with evening meal. Patient not taking: Reported on 05/25/2023 05/19/23   Garrison, [place] N, FNP  sucralfate (CARAFATE) 1 g tablet Take 1 g by mouth 4 (four) times daily.  02/11/20   [provider]  TRUE METRIX BLOOD GLUCOSE TEST test strip 1 each 3 (three) times daily. 09/20/21   [provider]  TRUEplus Lancets 33G MISC Apply 1 each topically 3 (three) times daily. 09/20/21   [provider]  esomeprazole (NEXIUM) 40 MG capsule Take 1 capsule (40 mg total) by mouth daily. Patient  not taking: Reported on 07/08/2020 04/24/19 08/06/20  Arby Barrette, MD    Family History Family History  Problem Relation Age of Onset   Allergic rhinitis Mother    Diabetes Father        type 2   Heart disease Father    Hypertension Father    Allergic rhinitis Father    Allergic rhinitis Sister    Collagen disease Sister    Heart disease Brother    Hypertension Brother    Angioedema Neg Hx    Asthma Neg Hx    Atopy Neg Hx    Eczema Neg Hx    Immunodeficiency Neg Hx    Urticaria Neg Hx     Social History Social History   Tobacco Use   Smoking status: Former    Current packs/day: 0.00    Average packs/day: 0.5 packs/day for 30.0 years (15.0 ttl pk-yrs)    Types: Cigarettes    Start date: 12/08/1964    Quit date: 12/08/1994    Years since quitting: 28.5   Smokeless tobacco: Never  Vaping Use   Vaping status: Never Used  Substance Use Topics   Alcohol use: No   Drug use: No     Allergies   Crab [shellfish allergy], Erythromycin, Hm lidocaine patch [lidocaine], Erdafitinib, Fish allergy, Metoclopramide, Sulfacetamide, Sulfacetamide sodium-sulfur, Doxycycline, Levaquin [levofloxacin in d5w], Morphine, and Sulfa antibiotics   Review of Systems Review of Systems  Skin:  Positive for rash.     Physical Exam Triage Vital Signs ED Triage Vitals  Encounter Vitals Group     BP 06/25/23 1543 120/80     Systolic BP Percentile --      Diastolic BP Percentile --      Pulse Rate 06/25/23 1543 97     Resp 06/25/23 1543 17     Temp 06/25/23 1543 98.3 F (36.8 C)     Temp Source 06/25/23 1543 Oral     SpO2 06/25/23 1543 96 %     Weight --      Height --      Head Circumference --      Peak Flow --  Pain Score 06/25/23 1542 0     Pain Loc --      Pain Education --      Exclude from Growth Chart --    No data found.  Updated Vital Signs BP 120/80 (BP Location: Right Arm)   Pulse 97   Temp 98.3 F (36.8 C) (Oral)   Resp 17   SpO2 96%   Visual Acuity Right  Eye Distance:   Left Eye Distance:   Bilateral Distance:    Right Eye Near:   Left Eye Near:    Bilateral Near:     Physical Exam   UC Treatments / Results  Labs (all labs ordered are listed, but only abnormal results are displayed) Labs Reviewed - No data to display  EKG   Radiology No results found.  Procedures Procedures (including critical care time)  Medications Ordered in UC Medications - No data to display  Initial Impression / Assessment and Plan / UC Course  I have reviewed the triage vital signs and the nursing notes.  Pertinent labs & imaging results that were available during my care of the patient were reviewed by me and considered in my medical decision making (see chart for details).     *** Final Clinical Impressions(s) / UC Diagnoses   Final diagnoses:  None   Discharge Instructions   None    ED Prescriptions   None    PDMP not reviewed this encounter.

## 2023-07-18 DIAGNOSIS — M5416 Radiculopathy, lumbar region: Secondary | ICD-10-CM | POA: Diagnosis not present

## 2023-07-19 DIAGNOSIS — K589 Irritable bowel syndrome without diarrhea: Secondary | ICD-10-CM | POA: Diagnosis not present

## 2023-07-19 DIAGNOSIS — E119 Type 2 diabetes mellitus without complications: Secondary | ICD-10-CM | POA: Diagnosis not present

## 2023-07-19 DIAGNOSIS — E349 Endocrine disorder, unspecified: Secondary | ICD-10-CM | POA: Diagnosis not present

## 2023-07-19 LAB — HM DEXA SCAN: HM Dexa Scan: NORMAL

## 2023-07-21 ENCOUNTER — Encounter: Payer: Self-pay | Admitting: Nurse Practitioner

## 2023-07-22 ENCOUNTER — Ambulatory Visit
Admission: EM | Admit: 2023-07-22 | Discharge: 2023-07-22 | Disposition: A | Discharge status: Home / Self Care | Payer: 59 | Attending: Internal Medicine | Admitting: Internal Medicine

## 2023-07-22 DIAGNOSIS — B889 Infestation, unspecified: Secondary | ICD-10-CM

## 2023-07-22 MED ORDER — PERMETHRIN 5 % EX CREA: TOPICAL_CREAM | CUTANEOUS | 1 refills | Status: DC

## 2023-07-22 NOTE — ED Triage Notes (Signed)
"  I came in because I think I have a repeat of Scabies".

## 2023-07-22 NOTE — Discharge Instructions (Addendum)
Permethrin cream as prescribed.  If you develop any new or worsening symptoms or if your symptoms do not start to improve, please return here or follow-up with your primary care provider. If your symptoms are severe, please go to the emergency room.

## 2023-07-22 NOTE — ED Provider Notes (Signed)
[facility]    CSN: [id number] Arrival date & time: 07/22/23  1034      History   Chief Complaint Chief Complaint  Patient presents with   Skin Problem    HPI Pamela Roberson is a 67 y.o. female.   Patient presents to urgent care for evaluation of bites due to suspected mites of the left upper leg that started yesterday.  Her adult son lives in an apartment that is infested with mites and this is the third time she has experienced dermatitis due to mites. 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 Bites are itchy, red, and swollen. No lesions to the hands but she states she does not experience any symptoms to hands when this happens. She is experiencing some itching to the lower back without rash as well. No lesions to the groin/vaginal area. Has not attempted treatment of symptoms PTA, requesting permethrin.      Past Medical History:  Diagnosis Date   Arthritis    Asthma    Carotid stenosis, bilateral    Carpal tunnel syndrome    Chronic low back pain    COPD (chronic obstructive pulmonary disease) (HCC)    Diabetes mellitus without complication (HCC)    type II    Diabetic neuropathy (HCC)    Diverticulitis    Dyspnea    mild    Frequent falls    GERD (gastroesophageal reflux disease)    History of bronchitis    Hypercholesterolemia    Hypertension    IBS (irritable bowel syndrome)    Osteoarthritis    knee   Palpitations    Right ventricular enlargement    per pt report   Sleep apnea    mild but no sleep apnea    TMJ (dislocation of temporomandibular joint)    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    Vitamin D deficiency     Patient Active Problem List   Diagnosis Date Noted   Pain in left knee 02/15/2022   Sciatica, left side 02/15/2022   Pain in left shoulder 11/16/2021   Internal hemorrhoids 11/03/2021   Leg cramps 11/03/2021   Myalgia 11/03/2021   Adenomatous polyp of  colon 12/04/2020   Anal fissure 12/04/2020   Chronic idiopathic constipation 12/04/2020   Constipation 12/04/2020   Dysphagia 12/04/2020   Gastroesophageal reflux disease 12/04/2020   Rectal pain 12/04/2020   Right upper quadrant pain 12/04/2020   Dizzy 12/11/2017   Pharyngeal dysphagia 12/11/2017   Post-nasal drip 12/11/2017   Referred ear pain, bilateral 12/11/2017   Diabetes mellitus without complication (HCC) 06/29/2017   Family history of glaucoma 06/29/2017   Hyperopia of both eyes with astigmatism and presbyopia 06/29/2017   Keratoconjunctivitis sicca of both eyes not specified as Sjogren's 06/29/2017   Nuclear sclerotic cataract of both eyes 06/29/2017   Vitreous floater, bilateral 06/29/2017   Biceps tendinitis of right shoulder 04/05/2017   Impingement syndrome of right shoulder 04/05/2017   Arthritis of right acromioclavicular joint 04/05/2017   Partial nontraumatic tear of rotator cuff, right 04/05/2017    Past Surgical History:  Procedure Laterality Date   ABDOMINAL HYSTERECTOMY  1993   CARPAL TUNNEL RELEASE Right    CERVICAL DISC SURGERY  04/29/2015   [place]   ESOPHAGEAL MANOMETRY N/A 03/03/2021   Procedure: ESOPHAGEAL MANOMETRY (EM);  Surgeon: Charlott Rakes, MD;  Location: [facility];  Service: Endoscopy;  Laterality: N/A;   ESOPHAGOGASTRODUODENOSCOPY ENDOSCOPY     with esophagus being stretched twice per pt,    EYE SURGERY     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      Home Medications    Prior to Admission medications   Medication Sig Start Date End Date Taking? Authorizing Provider  acyclovir (ZOVIRAX) 400 MG tablet Take 1 tablet (400 mg total) by mouth 2 (two) times daily. 04/07/23   Meriam Sprague, MD  ADVAIR [facility]  115-21 MCG/ACT inhaler INHALE 2 PUFFS INTO THE LUNGS TWICE DAILY 02/27/23   Marcelyn Bruins, MD  albuterol [facility] HFA) 108 (90 Base) MCG/ACT inhaler Inhale 1-2 puffs into the lungs every 6 (six) hours as needed for wheezing or shortness of breath. 01/12/23   Marcelyn Bruins, MD  albuterol (PROVENTIL) (2.5 MG/3ML) 0.083% nebulizer solution Take 3 mLs (2.5 mg total) by nebulization every 4 (four) hours as needed for wheezing or shortness of breath. 02/24/23   Padgett, Pilar Grammes, MD  amLODipine (NORVASC) 5 MG tablet Take 1 tablet (5 mg total) by mouth daily. 04/07/23   Meriam Sprague, MD  amoxicillin-clavulanate (AUGMENTIN) 875-125 MG tablet Take 1 tablet by mouth every 12 (twelve) hours. Patient not taking: Reported on 05/25/2023 05/19/23   Garrison, [place] N, FNP  Artificial Tear Ointment (DRY EYES OP) Place 1-2 drops into both eyes daily as needed (for dry eyes).     [provider]  ascorbic acid (VITAMIN C) 1000 MG tablet Take 1 tablet by mouth daily.    [provider]  atorvastatin (LIPITOR) 10 MG tablet Take 1 tablet (10 mg total) by mouth daily. 04/07/23   Meriam Sprague, MD  azelastine (OPTIVAR) 0.05 % ophthalmic solution Place 1 drop into both eyes 2 (two) times daily. 02/24/23   Marcelyn Bruins, MD  Blood Glucose Calibration (TRUE METRIX LEVEL 1) Low SOLN  09/20/21   [provider]  Blood Glucose Monitoring Suppl (TRUE METRIX METER) w/Device KIT  09/20/21   [provider]  Carbinoxamine Maleate 4 MG TABS Take 1 tablet (4 mg total) by mouth 2 (two) times daily. 02/24/23   Marcelyn Bruins, MD  cholecalciferol (VITAMIN D3) 25 MCG (1000 UNIT) tablet Take 2,000 Units by mouth daily.    [provider]  cycloSPORINE (RESTASIS) 0.05 % ophthalmic emulsion Place 1 drop into both eyes 2 times daily. 03/08/22   [provider]  EPINEPHrine 0.3 mg/0.3 mL IJ SOAJ injection INJECT ONE SYRINGE INTO THE  MUSCLE AS NEEDED FOR ANAPHYLAXIS 08/24/22   Marcelyn Bruins, MD  famotidine (PEPCID) 20 MG tablet 20 mg 1-2 times daily. 01/12/23   Marcelyn Bruins, MD  fluticasone (FLONASE) 50 MCG/ACT nasal spray Place 1 spray into both nostrils daily as needed for allergies or rhinitis.    [provider]  gabapentin (NEURONTIN) 300 MG capsule Take 300 mg by mouth 3 (three) times daily. Patient not taking: Reported on 05/25/2023    [provider]  guaiFENesin (ROBITUSSIN) 100 MG/5ML liquid Take 5-10 mLs (100-200 mg total) by mouth every 4 (four) hours as needed for cough or to loosen phlegm. 01/05/23   Gustavus Bryant, FNP  hydrOXYzine (ATARAX) 25 MG tablet Take 1 tablet (25 mg total) by mouth every 6 (six) hours. 06/25/23   White, Elita Boone, NP  JANUMET XR 50-500 MG TB24 Take 1 tablet by mouth daily. 04/23/23   [name], FNP  levocetirizine (XYZAL) 5 MG tablet Take 1 tablet (5 mg total) by mouth every evening. 01/12/23   Marcelyn Bruins, MD  metoprolol succinate (TOPROL-XL) 25 MG 24 hr tablet Take 1 tablet (25 mg total) by mouth daily. 04/07/23   Meriam Sprague, MD  montelukast (SINGULAIR) 10 MG tablet Take 1 tablet (10 mg total) by mouth daily. 01/12/23   Marcelyn Bruins, MD  Multiple Vitamin (MULTIVITAMIN WITH MINERALS) TABS tablet Take 1 tablet by mouth daily.    [provider]  mupirocin ointment (BACTROBAN) 2 % Apply 1 Application topically 2 (two) times daily. To affected area till better 02/21/23   Zenia Resides, MD  ondansetron (ZOFRAN-ODT) 4 MG disintegrating tablet Take 1 tablet (4 mg total) by mouth every 8 (eight) hours as needed for nausea or vomiting. 05/19/23   Garrison, [place] N, FNP  pantoprazole (PROTONIX) 40 MG tablet Take 1 tablet (40 mg total) by mouth 2 (two) times daily. 01/12/23   Marcelyn Bruins, MD  permethrin (ELIMITE) 5 % cream Apply to affected area once. Allow to sit on skin for 8-14 hours, then wash off.  07/22/23   Carlisle Beers, FNP  promethazine-dextromethorphan (PROMETHAZINE-DM) 6.25-15 MG/5ML syrup Take 5 mLs by mouth 3 (three) times daily as needed for cough. Patient not taking: Reported on 05/25/2023 03/03/23   Garrison, [place] N, FNP  simethicone [facility]) 40 MG/0.6ML drops Take 0.6 mLs (40 mg total) by mouth with breakfast, with lunch, and with evening meal. Patient not taking: Reported on 05/25/2023 05/19/23   Garrison, [place] N, FNP  sucralfate (CARAFATE) 1 g tablet Take 1 g by mouth 4 (four) times daily.  02/11/20   [provider]  TRUE METRIX BLOOD GLUCOSE TEST test strip 1 each 3 (three) times daily. 09/20/21   [provider]  TRUEplus Lancets 33G MISC Apply 1 each topically 3 (three) times daily. 09/20/21   [provider]  esomeprazole (NEXIUM) 40 MG capsule Take 1 capsule (40 mg total) by mouth daily. Patient not taking: Reported on 07/08/2020 04/24/19 08/06/20  Arby Barrette, MD    Family History Family History  Problem Relation Age of Onset   Allergic rhinitis Mother    Diabetes Father        type 2   Heart disease Father    Hypertension Father    Allergic rhinitis Father    Allergic rhinitis Sister    Collagen disease Sister    Heart disease Brother    Hypertension Brother    Angioedema Neg Hx    Asthma Neg Hx    Atopy Neg Hx    Eczema Neg Hx    Immunodeficiency Neg Hx    Urticaria Neg Hx     Social History Social History   Tobacco Use   Smoking status: Former    Current packs/day: 0.00    Average packs/day: 0.5 packs/day for 30.0 years (15.0 ttl pk-yrs)    Types: Cigarettes    Start date: 12/08/1964    Quit date: 12/08/1994    Years since quitting: 28.6   Smokeless tobacco: Never  Vaping Use   Vaping status: Never Used  Substance Use Topics   Alcohol use: No   Drug use: No     Allergies  Crab [shellfish allergy], Erythromycin, Hm lidocaine patch [lidocaine], Erdafitinib, Fish allergy, Metoclopramide, Sulfacetamide,  Sulfacetamide sodium-sulfur, Doxycycline, Levaquin [levofloxacin in d5w], Morphine, and Sulfa antibiotics   Review of Systems Review of Systems Per HPI  Physical Exam Triage Vital Signs ED Triage Vitals  Encounter Vitals Group     BP 07/22/23 1105 123/81     Systolic BP Percentile --      Diastolic BP Percentile --      Pulse Rate 07/22/23 1105 73     Resp 07/22/23 1105 18     Temp 07/22/23 1105 98.9 F (37.2 C)     Temp Source 07/22/23 1105 Oral     SpO2 07/22/23 1105 98 %     Weight 07/22/23 1103 203 lb 0.7 oz (92.1 kg)     Height 07/22/23 1103 5\' 6"  (1.676 m)     Head Circumference --      Peak Flow --      Pain Score 07/22/23 1102 0     Pain Loc --      Pain Education --      Exclude from Growth Chart --    No data found.  Updated Vital Signs BP 123/81 (BP Location: Left Arm)   Pulse 73   Temp 98.9 F (37.2 C) (Oral)   Resp 18   Ht 5\' 6"  (1.676 m)   Wt 203 lb 0.7 oz (92.1 kg)   SpO2 98%   BMI 32.77 kg/m   Visual Acuity Right Eye Distance:   Left Eye Distance:   Bilateral Distance:    Right Eye Near:   Left Eye Near:    Bilateral Near:     Physical Exam Vitals and nursing note reviewed.  Constitutional:      Appearance: She is not ill-appearing or toxic-appearing.  HENT:     Head: Normocephalic and atraumatic.     Right Ear: Hearing and external ear normal.     Left Ear: Hearing and external ear normal.     Nose: Nose normal.     Mouth/Throat:     Lips: Pink.  Eyes:     General: Lids are normal. Vision grossly intact. Gaze aligned appropriately.     Extraocular Movements: Extraocular movements intact.     Conjunctiva/sclera: Conjunctivae normal.  Pulmonary:     Effort: Pulmonary effort is normal.  Musculoskeletal:     Cervical back: Neck supple.  Skin:    General: Skin is warm and dry.     Capillary Refill: Capillary refill takes less than 2 seconds.     Findings: Rash (Erythematous papular lesions present to the left upper thigh as seen in  image below.) present.     Comments: No lesions to the interdigital spaces of the hands/feet. No other lesions to the body.  Neurological:     General: No focal deficit present.     Mental Status: She is alert and oriented to person, place, and time. Mental status is at baseline.     Cranial Nerves: No dysarthria or facial asymmetry.  Psychiatric:        Mood and Affect: Mood normal.        Speech: Speech normal.        Behavior: Behavior normal.        Thought Content: Thought content normal.        Judgment: Judgment normal.      UC Treatments / Results  Labs (all labs ordered are listed, but only abnormal results are displayed) Labs Reviewed -  No data to display  EKG   Radiology No results found.  Procedures Procedures (including critical care time)  Medications Ordered in UC Medications - No data to display  Initial Impression / Assessment and Plan / UC Course  I have reviewed the triage vital signs and the nursing notes.  Pertinent labs & imaging results that were available during my care of the patient were reviewed by me and considered in my medical decision making (see chart for details).   1. 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 Information placed in discharge packet for management/cleaning of environment. Health department has evaluated her son's apartment. PCP follow-up encouraged.   Counseled patient on potential for adverse effects with medications prescribed/recommended today, strict ER and return-to-clinic precautions discussed, patient verbalized understanding.   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  If you develop any new or worsening symptoms or if your symptoms do not start to improve, please return here or follow-up with your primary care provider. If your symptoms are severe,  please go to the emergency room.   ED Prescriptions     Medication Sig Dispense Auth. Provider   permethrin (ELIMITE) 5 % cream Apply to affected area once. Allow to sit on skin for 8-14 hours, then wash off. 60 g Carlisle Beers, FNP      PDMP not reviewed this encounter.   Carlisle Beers, [place] 07/22/23 1148

## 2023-07-24 NOTE — Addendum Note (Signed)
Addended by: Dollene Cleveland R on: 07/24/2023 01:39 PM   Modules accepted: Orders

## 2023-07-26 ENCOUNTER — Encounter: Payer: Self-pay | Admitting: Podiatry

## 2023-07-26 ENCOUNTER — Ambulatory Visit (INDEPENDENT_AMBULATORY_CARE_PROVIDER_SITE_OTHER): Payer: 59 | Admitting: Podiatry

## 2023-07-26 DIAGNOSIS — E1142 Type 2 diabetes mellitus with diabetic polyneuropathy: Secondary | ICD-10-CM | POA: Diagnosis not present

## 2023-07-26 DIAGNOSIS — B351 Tinea unguium: Secondary | ICD-10-CM | POA: Diagnosis not present

## 2023-07-26 DIAGNOSIS — M79674 Pain in right toe(s): Secondary | ICD-10-CM | POA: Diagnosis not present

## 2023-07-26 DIAGNOSIS — M79675 Pain in left toe(s): Secondary | ICD-10-CM | POA: Diagnosis not present

## 2023-07-26 DIAGNOSIS — L84 Corns and callosities: Secondary | ICD-10-CM

## 2023-07-26 DIAGNOSIS — R29898 Other symptoms and signs involving the musculoskeletal system: Secondary | ICD-10-CM | POA: Insufficient documentation

## 2023-07-26 DIAGNOSIS — M5416 Radiculopathy, lumbar region: Secondary | ICD-10-CM | POA: Insufficient documentation

## 2023-07-26 DIAGNOSIS — M5136 Other intervertebral disc degeneration, lumbar region: Secondary | ICD-10-CM | POA: Insufficient documentation

## 2023-07-26 NOTE — Progress Notes (Signed)
  Subjective:  Patient ID: Pamela Roberson, female    DOB: 1956-07-31,  MRN: [id number]  Pamela Roberson presents to clinic today for at risk foot care with history of diabetic neuropathy and callus(es) of both feet and painful thick toenails that are difficult to trim. Painful toenails interfere with ambulation. Aggravating factors include wearing enclosed shoe gear. Pain is relieved with periodic professional debridement. Painful calluses are aggravated when weightbearing with and without shoegear. Pain is relieved with periodic professional debridement.   New problem(s): None.   PCP is [name], FNP.  Allergies  Allergen Reactions   Crab [Shellfish Allergy] Shortness Of Breath and Swelling   Erythromycin Swelling and Rash   Hm Lidocaine Patch [Lidocaine]     Burning on skin   Erdafitinib     Other reaction(s): pt not sure   Fish Allergy     Swelling    Metoclopramide Other (See Comments)    Slurred speech and unable to move muscles,  Caused her to drag her feet   Sulfacetamide     Other reaction(s): itching, rash   Sulfacetamide Sodium-Sulfur Other (See Comments)    Other reaction(s): itching, rash   Doxycycline     GI Intolerance   Levaquin [Levofloxacin In D5w] Other (See Comments)    Causes irregular heart beat   Morphine Nausea And Vomiting and Rash    Irregular heart beat   Sulfa Antibiotics Rash    Review of Systems: Negative except as noted in the HPI.  Objective: No changes noted in today's physical examination. There were no vitals filed for this visit. Pamela Roberson is a pleasant 67 y.o. female in NAD. AAO x 3.  Vascular Examination: CFT <3 seconds b/l. DP pulses palpable b/l. PT pulses nonpalpable b/l. Digital hair absent. Skin temperature gradient warm to warm b/l. No pain with calf compression. No ischemia or gangrene. No cyanosis or clubbing noted b/l.    Neurological Examination: Sensation grossly intact b/l with 10 gram  monofilament. Vibratory sensation intact b/l. Pt has subjective symptoms of neuropathy.  Dermatological Examination: Pedal skin warm and supple b/l. No open wounds b/l. No interdigital macerations. Toenails 1-5 b/l thick, discolored, elongated with subungual debris and pain on dorsal palpation.   Hyperkeratotic lesion(s) submet head 5 b/l, submet head 1 left foot and sub 5th met base right foot.  No erythema, no edema, no drainage, no fluctuance.   Musculoskeletal Examination: Muscle strength 5/5 to all lower extremity muscle groups bilaterally. 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  Radiographs: None  Last HgA1c:      Latest Ref Rng & Units 04/06/2023   12:27 PM  Hemoglobin A1C  Hemoglobin-A1c 4.8 - 5.6 % 7.1    Assessment/Plan: 1. Pain due to onychomycosis of toenails of both feet   2. Callus   3. Diabetic peripheral neuropathy associated with type 2 diabetes mellitus (HCC)     -Consent given for treatment as described below: -Examined patient. -Continue foot and shoe inspections daily. Monitor blood glucose per PCP/Endocrinologist's recommendations. -Mycotic toenails 1-5 bilaterally were debrided in length and girth with sterile nail nippers and dremel without incident. -Callus(es) submet head 1 left foot, submet head 5 b/l, and sub 5th met base right lower extremity pared utilizing sterile scalpel blade without complication or incident. Total number debrided =4. -Patient/POA to call should there be question/concern in the interim.   Return in about 3 months (around 10/26/2023).  Freddie Breech, DPM

## 2023-07-27 DIAGNOSIS — D120 Benign neoplasm of cecum: Secondary | ICD-10-CM | POA: Diagnosis not present

## 2023-07-27 DIAGNOSIS — K5730 Diverticulosis of large intestine without perforation or abscess without bleeding: Secondary | ICD-10-CM | POA: Diagnosis not present

## 2023-07-27 DIAGNOSIS — D125 Benign neoplasm of sigmoid colon: Secondary | ICD-10-CM | POA: Diagnosis not present

## 2023-07-27 DIAGNOSIS — K648 Other hemorrhoids: Secondary | ICD-10-CM | POA: Diagnosis not present

## 2023-07-27 DIAGNOSIS — Z09 Encounter for follow-up examination after completed treatment for conditions other than malignant neoplasm: Secondary | ICD-10-CM | POA: Diagnosis not present

## 2023-07-27 DIAGNOSIS — Z86010 Personal history of colonic polyps: Secondary | ICD-10-CM | POA: Diagnosis not present

## 2023-07-27 DIAGNOSIS — D122 Benign neoplasm of ascending colon: Secondary | ICD-10-CM | POA: Diagnosis not present

## 2023-07-27 DIAGNOSIS — D124 Benign neoplasm of descending colon: Secondary | ICD-10-CM | POA: Diagnosis not present

## 2023-07-27 LAB — HM COLONOSCOPY

## 2023-07-28 LAB — STREP PNEUMONIAE 23 SEROTYPES IGG
Pneumo Ab Type 1*: 11.5 ug/mL (ref >1.3)
Pneumo Ab Type 12 (12F)*: 0.9 ug/mL — ABNORMAL LOW (ref >1.3)
Pneumo Ab Type 14*: >30.6 ug/mL (ref >1.3)
Pneumo Ab Type 17 (17F)*: 3.6 ug/mL (ref >1.3)
Pneumo Ab Type 19 (19F)*: 17.7 ug/mL (ref >1.3)
Pneumo Ab Type 2*: 8.2 ug/mL (ref >1.3)
Pneumo Ab Type 20*: 25.1 ug/mL (ref >1.3)
Pneumo Ab Type 22 (22F)*: 4.9 ug/mL (ref >1.3)
Pneumo Ab Type 23 (23F)*: 17.1 ug/mL (ref >1.3)
Pneumo Ab Type 26 (6B)*: 6.7 ug/mL (ref >1.3)
Pneumo Ab Type 3*: 3.2 ug/mL (ref >1.3)
Pneumo Ab Type 34 (10A)*: 12.6 ug/mL (ref >1.3)
Pneumo Ab Type 4*: 13.5 ug/mL (ref >1.3)
Pneumo Ab Type 43 (11A)*: 2.6 ug/mL (ref >1.3)
Pneumo Ab Type 5*: 32.2 ug/mL (ref >1.3)
Pneumo Ab Type 51 (7F)*: 9.3 ug/mL (ref >1.3)
Pneumo Ab Type 54 (15B)*: 17.9 ug/mL (ref >1.3)
Pneumo Ab Type 56 (18C)*: 3.4 ug/mL (ref >1.3)
Pneumo Ab Type 57 (19A)*: 6.9 ug/mL (ref >1.3)
Pneumo Ab Type 68 (9V)*: 14.8 ug/mL (ref >1.3)
Pneumo Ab Type 70 (33F)*: 13.6 ug/mL (ref >1.3)
Pneumo Ab Type 8*: 55.7 ug/mL (ref >1.3)
Pneumo Ab Type 9 (9N)*: 28.2 ug/mL (ref >1.3)

## 2023-07-30 ENCOUNTER — Ambulatory Visit
Admission: EM | Admit: 2023-07-30 | Discharge: 2023-07-30 | Disposition: A | Discharge status: Home / Self Care | Payer: 59 | Attending: Internal Medicine | Admitting: Internal Medicine

## 2023-07-30 DIAGNOSIS — L299 Pruritus, unspecified: Secondary | ICD-10-CM | POA: Diagnosis not present

## 2023-07-30 DIAGNOSIS — J069 Acute upper respiratory infection, unspecified: Secondary | ICD-10-CM | POA: Diagnosis not present

## 2023-07-30 DIAGNOSIS — Z1152 Encounter for screening for COVID-19: Secondary | ICD-10-CM | POA: Insufficient documentation

## 2023-07-30 MED ORDER — BENZONATATE 100 MG PO CAPS: 100.0000 mg | ORAL_CAPSULE | Freq: Three times a day (TID) | ORAL | 0 refills | Status: DC | PRN

## 2023-07-30 NOTE — Discharge Instructions (Signed)
It appears that you have a viral illness that should run its course.  I have prescribed cough medication to help alleviate symptoms.  COVID test is pending.  Please follow-up with dermatology and/or allergy specialist for persistent itching.

## 2023-07-30 NOTE — ED Triage Notes (Signed)
Pt reports having nose bleed last Wednesday, and states she has had a dry cough, congestion, muscle aches and chills (occurs at night).  Home interventions: none

## 2023-07-30 NOTE — ED Provider Notes (Addendum)
[facility]    CSN: [id number] Arrival date & time: 07/30/23  9562      History   Chief Complaint Chief Complaint  Patient presents with   Cough   Nasal Congestion   Generalized Body Aches   Chills    HPI Pamela Roberson is a 67 y.o. female.   Patient presents with epistaxis, nasal congestion, dry cough, body aches, chills that started about 5 days ago.  Patient denies any known sick contacts or fevers at home.  Patient does report that she has a history of asthma but has not had to use any of her rescue inhaler or nebulizer treatment since being sick.  Denies chest pain or shortness of breath.  She has not taken any medications for symptoms.  Reports that epistaxis occurred on first day of symptoms but is now resolved.  Patient is not reporting any trauma to the nose.  Patient also mentions that she has been having some itchiness for the past few weeks.  She was exposed to scabies and has been seen at this urgent care twice and treated with topical cream and hydroxyzine.  Reports that she no longer has a rash but simply just has itchiness.   Cough   Past Medical History:  Diagnosis Date   Arthritis    Asthma    Carotid stenosis, bilateral    Carpal tunnel syndrome    Chronic low back pain    COPD (chronic obstructive pulmonary disease) (HCC)    Diabetes mellitus without complication (HCC)    type II    Diabetic neuropathy (HCC)    Diverticulitis    Dyspnea    mild    Frequent falls    GERD (gastroesophageal reflux disease)    History of bronchitis    Hypercholesterolemia    Hypertension    IBS (irritable bowel syndrome)    Osteoarthritis    knee   Palpitations    Right ventricular enlargement    per pt report   Sleep apnea    mild but no sleep apnea    TMJ (dislocation of temporomandibular joint)    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    Vitamin D deficiency     Patient Active Problem List   Diagnosis Date Noted   Degeneration of  lumbar intervertebral disc 07/26/2023   Diabetic peripheral neuropathy (HCC) 07/26/2023   Left leg weakness 07/26/2023   Lumbar radiculopathy 07/26/2023   Pain in left knee 02/15/2022   Sciatica, left side 02/15/2022   Pain in left shoulder 11/16/2021   Internal hemorrhoids 11/03/2021   Leg cramps 11/03/2021   Myalgia 11/03/2021   Adenomatous polyp of colon 12/04/2020   Anal fissure 12/04/2020   Chronic idiopathic constipation 12/04/2020   Constipation 12/04/2020   Dysphagia 12/04/2020   Gastroesophageal reflux disease 12/04/2020   Rectal pain 12/04/2020   Right upper quadrant pain 12/04/2020   Dizzy 12/11/2017   Pharyngeal dysphagia 12/11/2017   Post-nasal drip 12/11/2017   Referred ear pain, bilateral 12/11/2017   Diabetes mellitus without complication (HCC) 06/29/2017   Family history of glaucoma 06/29/2017   Hyperopia of both eyes with astigmatism and presbyopia 06/29/2017   Keratoconjunctivitis sicca of both eyes not specified as Sjogren's 06/29/2017   Nuclear sclerotic cataract of both eyes 06/29/2017   Vitreous floater, bilateral 06/29/2017   Biceps tendinitis of right shoulder 04/05/2017   Impingement syndrome of right shoulder 04/05/2017   Arthritis of right acromioclavicular joint 04/05/2017   Partial nontraumatic  tear of rotator cuff, right 04/05/2017    Past Surgical History:  Procedure Laterality Date   ABDOMINAL HYSTERECTOMY  1993   CARPAL TUNNEL RELEASE Right    CERVICAL DISC SURGERY  04/29/2015   Dr [place]rie [place]   ESOPHAGEAL MANOMETRY N/A 03/03/2021   Procedure: ESOPHAGEAL MANOMETRY (EM);  Surgeon: Charlott Rakes, MD;  Location: [facility];  Service: Endoscopy;  Laterality: N/A;   ESOPHAGOGASTRODUODENOSCOPY ENDOSCOPY     with esophagus being stretched twice per pt,    EYE SURGERY     fatty 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      feeding tube placement and removal   2015   NASAL SEPTUM SURGERY     ROTATOR CUFF REPAIR Right    spurs removed from  esophagus   2015   TONSILLECTOMY      OB History   No obstetric history on file.      Home Medications    Prior to Admission medications   Medication Sig Start Date End Date Taking? Authorizing Provider  benzonatate (TESSALON) 100 MG capsule Take 1 capsule (100 mg total) by mouth every 8 (eight) hours as needed for cough. 07/30/23  Yes Jaishawn Witzke, Acie Fredrickson, FNP  acyclovir (ZOVIRAX) 400 MG tablet Take 1 tablet (400 mg total) by mouth 2 (two) times daily. 04/07/23   Meriam Sprague, MD  ADVAIR [facility] 115-21 MCG/ACT inhaler INHALE 2 PUFFS INTO THE LUNGS TWICE DAILY 02/27/23   Marcelyn Bruins, MD  albuterol [facility] HFA) 108 (90 Base) MCG/ACT inhaler Inhale 1-2 puffs into the lungs every 6 (six) hours as needed for wheezing or shortness of breath. 01/12/23   Marcelyn Bruins, MD  albuterol (PROVENTIL) (2.5 MG/3ML) 0.083% nebulizer solution Take 3 mLs (2.5 mg total) by nebulization every 4 (four) hours as needed for wheezing or shortness of breath. 02/24/23   Padgett, Pilar Grammes, MD  amLODipine (NORVASC) 5 MG tablet Take 1 tablet (5 mg total) by mouth daily. 04/07/23   Meriam Sprague, MD  amoxicillin-clavulanate (AUGMENTIN) 875-125 MG tablet Take 1 tablet by mouth every 12 (twelve) hours. Patient not taking: Reported on 05/25/2023 05/19/23   Garrison, [place] N, FNP  Artificial Tear Ointment (DRY EYES OP) Place 1-2 drops into both eyes daily as needed (for dry eyes).     [provider]  ascorbic acid (VITAMIN C) 1000 MG tablet Take 1 tablet by mouth daily.    [provider]  atorvastatin (LIPITOR) 10 MG tablet Take 1 tablet (10 mg total) by mouth daily. 04/07/23   Meriam Sprague, MD  azelastine (OPTIVAR) 0.05 % ophthalmic solution Place 1 drop into both eyes 2 (two) times daily. 02/24/23   Marcelyn Bruins, MD  Blood Glucose Calibration (TRUE METRIX LEVEL 1) Low SOLN  09/20/21   [provider]  Blood Glucose Monitoring Suppl (TRUE METRIX  METER) w/Device KIT  09/20/21   [provider]  Carbinoxamine Maleate 4 MG TABS Take 1 tablet (4 mg total) by mouth 2 (two) times daily. 02/24/23   Marcelyn Bruins, MD  cholecalciferol (VITAMIN D3) 25 MCG (1000 UNIT) tablet Take 2,000 Units by mouth daily.    [provider]  cycloSPORINE (RESTASIS) 0.05 % ophthalmic emulsion Place 1 drop into both eyes 2 times daily. 03/08/22   [provider]  EPINEPHrine 0.3 mg/0.3 mL IJ SOAJ injection INJECT ONE SYRINGE INTO THE MUSCLE AS NEEDED FOR ANAPHYLAXIS 08/24/22   Marcelyn Bruins, MD  famotidine (PEPCID) 20 MG tablet 20 mg  1-2 times daily. 01/12/23   Marcelyn Bruins, MD  fluticasone (FLONASE) 50 MCG/ACT nasal spray Place 1 spray into both nostrils daily as needed for allergies or rhinitis.    [provider]  gabapentin (NEURONTIN) 300 MG capsule Take 300 mg by mouth 3 (three) times daily. Patient not taking: Reported on 05/25/2023    [provider]  guaiFENesin (ROBITUSSIN) 100 MG/5ML liquid Take 5-10 mLs (100-200 mg total) by mouth every 4 (four) hours as needed for cough or to loosen phlegm. 01/05/23   Gustavus Bryant, FNP  hydrOXYzine (ATARAX) 25 MG tablet Take 1 tablet (25 mg total) by mouth every 6 (six) hours. 06/25/23   White, Elita Boone, NP  JANUMET XR 50-500 MG TB24 Take 1 tablet by mouth daily. 04/23/23   [name], FNP  levocetirizine (XYZAL) 5 MG tablet Take 1 tablet (5 mg total) by mouth every evening. 01/12/23   Marcelyn Bruins, MD  metoprolol succinate (TOPROL-XL) 25 MG 24 hr tablet Take 1 tablet (25 mg total) by mouth daily. 04/07/23   Meriam Sprague, MD  montelukast (SINGULAIR) 10 MG tablet Take 1 tablet (10 mg total) by mouth daily. 01/12/23   Marcelyn Bruins, MD  Multiple Vitamin (MULTIVITAMIN WITH MINERALS) TABS tablet Take 1 tablet by mouth daily.    [provider]  mupirocin ointment (BACTROBAN) 2 % Apply 1 Application topically 2  (two) times daily. To affected area till better 02/21/23   Zenia Resides, MD  ondansetron (ZOFRAN-ODT) 4 MG disintegrating tablet Take 1 tablet (4 mg total) by mouth every 8 (eight) hours as needed for nausea or vomiting. 05/19/23   Garrison, [place] N, FNP  pantoprazole (PROTONIX) 40 MG tablet Take 1 tablet (40 mg total) by mouth 2 (two) times daily. 01/12/23   Marcelyn Bruins, MD  permethrin (ELIMITE) 5 % cream Apply to affected area once. Allow to sit on skin for 8-14 hours, then wash off. 07/22/23   Carlisle Beers, FNP  promethazine-dextromethorphan (PROMETHAZINE-DM) 6.25-15 MG/5ML syrup Take 5 mLs by mouth 3 (three) times daily as needed for cough. Patient not taking: Reported on 05/25/2023 03/03/23   Garrison, [place] N, FNP  simethicone [facility]) 40 MG/0.6ML drops Take 0.6 mLs (40 mg total) by mouth with breakfast, with lunch, and with evening meal. Patient not taking: Reported on 05/25/2023 05/19/23   Garrison, [place] N, FNP  sucralfate (CARAFATE) 1 g tablet Take 1 g by mouth 4 (four) times daily.  02/11/20   [provider]  TRUE METRIX BLOOD GLUCOSE TEST test strip 1 each 3 (three) times daily. 09/20/21   [provider]  TRUEplus Lancets 33G MISC Apply 1 each topically 3 (three) times daily. 09/20/21   [provider]  esomeprazole (NEXIUM) 40 MG capsule Take 1 capsule (40 mg total) by mouth daily. Patient not taking: Reported on 07/08/2020 04/24/19 08/06/20  Arby Barrette, MD    Family History Family History  Problem Relation Age of Onset   Allergic rhinitis Mother    Diabetes Father        type 2   Heart disease Father    Hypertension Father    Allergic rhinitis Father    Allergic rhinitis Sister    Collagen disease Sister    Heart disease Brother    Hypertension Brother    Angioedema Neg Hx    Asthma Neg Hx    Atopy Neg Hx    Eczema Neg Hx    Immunodeficiency Neg Hx  Urticaria Neg Hx     Social History Social History   Tobacco  Use   Smoking status: Former    Current packs/day: 0.00    Average packs/day: 0.5 packs/day for 30.0 years (15.0 ttl pk-yrs)    Types: Cigarettes    Start date: 12/08/1964    Quit date: 12/08/1994    Years since quitting: 28.6   Smokeless tobacco: Never  Vaping Use   Vaping status: Never Used  Substance Use Topics   Alcohol use: No   Drug use: No     Allergies   Crab [shellfish allergy], Erythromycin, Hm lidocaine patch [lidocaine], Erdafitinib, Fish allergy, Metoclopramide, Sulfacetamide, Sulfacetamide sodium-sulfur, Doxycycline, Levaquin [levofloxacin in d5w], Morphine, and Sulfa antibiotics   Review of Systems Review of Systems Per HPI  Physical Exam Triage Vital Signs ED Triage Vitals [07/30/23 0935]  Encounter Vitals Group     BP 129/73     Systolic BP Percentile      Diastolic BP Percentile      Pulse Rate 73     Resp 18     Temp 97.9 F (36.6 C)     Temp Source Oral     SpO2 97 %     Weight      Height      Head Circumference      Peak Flow      Pain Score 5     Pain Loc      Pain Education      Exclude from Growth Chart    No data found.  Updated Vital Signs BP 129/73 (BP Location: Right Arm)   Pulse 73   Temp 97.9 F (36.6 C) (Oral)   Resp 18   SpO2 97%   Visual Acuity Right Eye Distance:   Left Eye Distance:   Bilateral Distance:    Right Eye Near:   Left Eye Near:    Bilateral Near:     Physical Exam Constitutional:      General: She is not in acute distress.    Appearance: Normal appearance. She is not toxic-appearing or diaphoretic.  HENT:     Head: Normocephalic and atraumatic.     Right Ear: Tympanic membrane and ear canal normal.     Left Ear: Tympanic membrane and ear canal normal.     Nose: Congestion present.     Mouth/Throat:     Mouth: Mucous membranes are moist.     Pharynx: No posterior oropharyngeal erythema.  Eyes:     Extraocular Movements: Extraocular movements intact.     Conjunctiva/sclera: Conjunctivae normal.      Pupils: Pupils are equal, round, and reactive to light.  Cardiovascular:     Rate and Rhythm: Normal rate and regular rhythm.     Pulses: Normal pulses.     Heart sounds: Normal heart sounds.  Pulmonary:     Effort: Pulmonary effort is normal. No respiratory distress.     Breath sounds: Normal breath sounds. No stridor. No wheezing, rhonchi or rales.  Abdominal:     General: Abdomen is flat. Bowel sounds are normal.     Palpations: Abdomen is soft.  Musculoskeletal:        General: Normal range of motion.     Cervical back: Normal range of motion.  Skin:    General: Skin is warm and dry.     Comments: No obvious rash noted.   Neurological:     General: No focal deficit present.     Mental Status:  She is alert and oriented to person, place, and time. Mental status is at baseline.  Psychiatric:        Mood and Affect: Mood normal.        Behavior: Behavior normal.      UC Treatments / Results  Labs (all labs ordered are listed, but only abnormal results are displayed) Labs Reviewed  SARS CORONAVIRUS 2 (TAT 6-24 HRS)    EKG   Radiology No results found.  Procedures Procedures (including critical care time)  Medications Ordered in UC Medications - No data to display  Initial Impression / Assessment and Plan / UC Course  I have reviewed the triage vital signs and the nursing notes.  Pertinent labs & imaging results that were available during my care of the patient were reviewed by me and considered in my medical decision making (see chart for details).     Viral URI  Patient presents with symptoms likely from a viral upper respiratory infection. Do not suspect underlying cardiopulmonary process. Symptoms seem unlikely related to ACS, CHF or COPD exacerbations, pneumonia, pneumothorax. Patient is nontoxic appearing and not in need of emergent medical intervention.  COVID test pending. Recommended symptom control with over the counter medications.  I suspect that  episode of epistaxis was due to inflammation of mucous membranes.  No current epistaxis or signs of bacterial infection on exam.  Patient does not take blood thinning medications.  Advised to follow-up if this occurs again.  2. Pruritus  Patient reporting itching after being treated for scabies.  No obvious rash that needs additional intervention.  Will defer ivermectin given no rash noted.  I do think patient should see dermatology and/or allergy specialist for further evaluation and management of pruritus.  Patient given contact information for follow-up.  Advised supportive care in the meantime.  Return if symptoms fail to improve in 1-2 weeks or you develop shortness of breath, chest pain, severe headache. Patient states understanding and is agreeable.  Discharged with PCP followup.  Final Clinical Impressions(s) / UC Diagnoses   Final diagnoses:  Viral upper respiratory tract infection with cough  Pruritus     Discharge Instructions      It appears that you have a viral illness that should run its course.  I have prescribed cough medication to help alleviate symptoms.  COVID test is pending.  Please follow-up with dermatology and/or allergy specialist for persistent itching.    ED Prescriptions     Medication Sig Dispense Auth. Provider   benzonatate (TESSALON) 100 MG capsule Take 1 capsule (100 mg total) by mouth every 8 (eight) hours as needed for cough. 21 capsule [place], Acie Fredrickson, [place]      PDMP not reviewed this encounter.   Gustavus Bryant, [place] 07/30/23 1005    [address] [place] 07/30/23 1057

## 2023-07-31 ENCOUNTER — Telehealth: Payer: Self-pay

## 2023-07-31 DIAGNOSIS — D125 Benign neoplasm of sigmoid colon: Secondary | ICD-10-CM | POA: Diagnosis not present

## 2023-07-31 DIAGNOSIS — D124 Benign neoplasm of descending colon: Secondary | ICD-10-CM | POA: Diagnosis not present

## 2023-07-31 DIAGNOSIS — D120 Benign neoplasm of cecum: Secondary | ICD-10-CM | POA: Diagnosis not present

## 2023-07-31 DIAGNOSIS — D122 Benign neoplasm of ascending colon: Secondary | ICD-10-CM | POA: Diagnosis not present

## 2023-07-31 LAB — SARS CORONAVIRUS 2 (TAT 6-24 HRS): SARS Coronavirus 2: NEGATIVE

## 2023-07-31 MED ORDER — ALBUTEROL SULFATE HFA 108 (90 BASE) MCG/ACT IN AERS: 1.0000 | INHALATION_SPRAY | Freq: Four times a day (QID) | RESPIRATORY_TRACT | 1 refills | Status: DC | PRN

## 2023-07-31 MED ORDER — MONTELUKAST SODIUM 10 MG PO TABS: 10.0000 mg | ORAL_TABLET | Freq: Every day | ORAL | 2 refills | Status: DC

## 2023-07-31 MED ORDER — ADVAIR HFA 115-21 MCG/ACT IN AERO: 2.0000 | INHALATION_SPRAY | Freq: Two times a day (BID) | RESPIRATORY_TRACT | 2 refills | Status: DC

## 2023-07-31 MED ORDER — ALBUTEROL SULFATE (2.5 MG/3ML) 0.083% IN NEBU: 2.5000 mg | INHALATION_SOLUTION | RESPIRATORY_TRACT | 1 refills | Status: DC | PRN

## 2023-07-31 MED ORDER — EPINEPHRINE 0.3 MG/0.3ML IJ SOAJ: 0.3000 mg | INTRAMUSCULAR | 1 refills | Status: DC | PRN

## 2023-07-31 NOTE — Telephone Encounter (Signed)
Spoke to patient on the phone about lab result notes. Patient stated that she needed to make an appointment because her mediations were about to run out of refills. Patient stated that she only wanted to see Dr. Delorse Lek so the soonest appointment was made for this patient. Sent in enough medication until the scheduled follow up visit for inhalers and Epi-pen.

## 2023-08-08 ENCOUNTER — Other Ambulatory Visit: Payer: Self-pay

## 2023-08-08 DIAGNOSIS — J45909 Unspecified asthma, uncomplicated: Secondary | ICD-10-CM

## 2023-08-08 DIAGNOSIS — R0609 Other forms of dyspnea: Secondary | ICD-10-CM

## 2023-08-08 DIAGNOSIS — I10 Essential (primary) hypertension: Secondary | ICD-10-CM

## 2023-08-08 DIAGNOSIS — E119 Type 2 diabetes mellitus without complications: Secondary | ICD-10-CM

## 2023-08-08 MED ORDER — AMLODIPINE BESYLATE 5 MG PO TABS
5.0000 mg | ORAL_TABLET | Freq: Every day | ORAL | 1 refills | Status: DC
Start: 2023-08-08 — End: 2023-10-03

## 2023-08-14 ENCOUNTER — Other Ambulatory Visit: Payer: 59

## 2023-08-17 ENCOUNTER — Ambulatory Visit (INDEPENDENT_AMBULATORY_CARE_PROVIDER_SITE_OTHER): Payer: 59

## 2023-08-17 ENCOUNTER — Ambulatory Visit (HOSPITAL_COMMUNITY)
Admission: EM | Admit: 2023-08-17 | Discharge: 2023-08-17 | Disposition: A | Discharge status: Home / Self Care | Payer: 59 | Attending: Family Medicine | Admitting: Family Medicine

## 2023-08-17 ENCOUNTER — Encounter (HOSPITAL_COMMUNITY): Payer: Self-pay

## 2023-08-17 DIAGNOSIS — R1013 Epigastric pain: Secondary | ICD-10-CM

## 2023-08-17 DIAGNOSIS — J0100 Acute maxillary sinusitis, unspecified: Secondary | ICD-10-CM | POA: Diagnosis not present

## 2023-08-17 DIAGNOSIS — K5641 Fecal impaction: Secondary | ICD-10-CM | POA: Diagnosis not present

## 2023-08-17 LAB — POCT FASTING CBG KUC MANUAL ENTRY: POCT Glucose (KUC): 136 mg/dL — AB (ref 70–99)

## 2023-08-17 MED ORDER — AMOXICILLIN 875 MG PO TABS: 875.0000 mg | ORAL_TABLET | Freq: Two times a day (BID) | ORAL | 0 refills | Status: AC

## 2023-08-17 NOTE — ED Triage Notes (Signed)
Headache; Nosebleeds; Vomiting, itching all over (when outside), fatigue onset Sunday. No known falls or injuries, diagnosed with a URI 2 weeks ago but was not given anything. Patient has history of an allergy to grass.  No blurred vision or dizziness. No dietary or medication changes.

## 2023-08-17 NOTE — Discharge Instructions (Signed)
Call you gastroenterologist in the morning to see when they can work you in.

## 2023-08-21 ENCOUNTER — Ambulatory Visit: Payer: 59 | Admitting: Nurse Practitioner

## 2023-08-21 ENCOUNTER — Encounter: Payer: Self-pay | Admitting: Nurse Practitioner

## 2023-08-21 VITALS — BP 110/76 | HR 91 | Temp 98.5°F | Ht 66.0 in | Wt 192.8 lb

## 2023-08-21 DIAGNOSIS — Z0000 Encounter for general adult medical examination without abnormal findings: Secondary | ICD-10-CM

## 2023-08-21 DIAGNOSIS — E1142 Type 2 diabetes mellitus with diabetic polyneuropathy: Secondary | ICD-10-CM

## 2023-08-21 DIAGNOSIS — E2839 Other primary ovarian failure: Secondary | ICD-10-CM

## 2023-08-21 DIAGNOSIS — M6281 Muscle weakness (generalized): Secondary | ICD-10-CM | POA: Diagnosis not present

## 2023-08-21 DIAGNOSIS — R0982 Postnasal drip: Secondary | ICD-10-CM

## 2023-08-21 DIAGNOSIS — E1140 Type 2 diabetes mellitus with diabetic neuropathy, unspecified: Secondary | ICD-10-CM

## 2023-08-21 DIAGNOSIS — Z79899 Other long term (current) drug therapy: Secondary | ICD-10-CM

## 2023-08-21 DIAGNOSIS — Z2821 Immunization not carried out because of patient refusal: Secondary | ICD-10-CM

## 2023-08-21 LAB — POCT URINALYSIS DIP (CLINITEK)
Glucose, UA: NEGATIVE mg/dL
Ketones, POC UA: NEGATIVE mg/dL
Leukocytes, UA: NEGATIVE
Nitrite, UA: NEGATIVE
Spec Grav, UA: >=1.03 — AB (ref 1.010–1.025)
Urobilinogen, UA: 0.2 U/dL
pH, UA: 6 (ref 5.0–8.0)

## 2023-08-21 MED ORDER — PRAVASTATIN SODIUM 20 MG PO TABS
20.0000 mg | ORAL_TABLET | Freq: Every evening | ORAL | 11 refills | Status: DC
Start: 2023-08-21 — End: 2023-10-10

## 2023-08-21 MED ORDER — EMPAGLIFLOZIN 10 MG PO TABS
10.0000 mg | ORAL_TABLET | Freq: Every day | ORAL | 3 refills | Status: DC
Start: 2023-08-21 — End: 2023-10-10

## 2023-08-21 NOTE — Progress Notes (Unsigned)
Pamela Roberson, CMA,acting as a Neurosurgeon for Pamela Felts, FNP.,have documented all relevant documentation on the behalf of Pamela Felts, FNP,as directed by  Pamela Felts, FNP while in the presence of Pamela Felts, FNP.  Subjective:    Patient ID: Pamela Roberson , female    DOB: 05-21-1956 , 67 y.o.   MRN: [id number]  Chief Complaint  Patient presents with   Annual Exam    HPI  Patient presents today for HM, patient reports compliance with medications. Patient denies any chest pain, SOB, or headaches. Patient reports she is still having symptoms from Upper respiratory infection-headache daily, pain around nose, nose bleeds monthly from left nostril. 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 She had a her colonoscopy done in August by Dr. Bosie Clos. She feels like her janumet causes her throat to itch. She has taken victoza in the past while taking metformin and she had GI upset and a lump to her throat which improved after stopping the Victoza.      Past Medical History:  Diagnosis Date   Arthritis    Asthma    Carotid stenosis, bilateral    Carpal tunnel syndrome    Chronic low back pain    COPD (chronic obstructive pulmonary disease) (HCC)    Diabetes mellitus without complication (HCC)    type II    Diabetic neuropathy (HCC)    Diverticulitis    Dyspnea    mild    Frequent falls    GERD (gastroesophageal reflux disease)    History of bronchitis    Hypercholesterolemia    Hypertension    IBS (irritable bowel syndrome)    Osteoarthritis    knee   Palpitations    Right ventricular enlargement    per pt report   Sleep apnea    mild but no sleep apnea    TMJ (dislocation of temporomandibular joint)    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    Vitamin D deficiency      Family History  Problem Relation Age of Onset   Allergic rhinitis Mother    Diabetes Father        type 2   Heart disease Father    Hypertension  Father    Allergic rhinitis Father    Allergic rhinitis Sister    Collagen disease Sister    Heart disease Brother    Hypertension Brother    Angioedema Neg Hx    Asthma Neg Hx    Atopy Neg Hx    Eczema Neg Hx    Immunodeficiency Neg Hx    Urticaria Neg Hx      Current Outpatient Medications:    acyclovir (ZOVIRAX) 400 MG tablet, Take 1 tablet (400 mg total) by mouth 2 (two) times daily., Disp: 30 tablet, Rfl: 4   ADVAIR HFA 115-21 MCG/ACT inhaler, Inhale 2 puffs into the lungs 2 (two) times daily., Disp: 12 g, Rfl: 2   albuterol (PROAIR HFA) 108 (90 Base) MCG/ACT inhaler, Inhale 1-2 puffs into the lungs every 6 (six) hours as needed for wheezing or shortness of breath., Disp: 18 g, Rfl: 1   albuterol (PROVENTIL) (2.5 MG/3ML) 0.083% nebulizer solution, Take 3 mLs (2.5 mg total) by nebulization every 4 (four) hours as needed for wheezing or shortness of breath., Disp: 75 mL, Rfl: 1   amLODipine (NORVASC) 5 MG tablet, Take 1 tablet (5 mg total) by mouth daily., Disp: 30 tablet,  Rfl: 1   Artificial Tear Ointment (DRY EYES OP), Place 1-2 drops into both eyes daily as needed (for dry eyes). , Disp: , Rfl:    ascorbic acid (VITAMIN C) 1000 MG tablet, Take 1 tablet by mouth daily., Disp: , Rfl:    azelastine (OPTIVAR) 0.05 % ophthalmic solution, Place 1 drop into both eyes 2 (two) times daily., Disp: 6 mL, Rfl: 5   Blood Glucose Calibration (TRUE METRIX LEVEL 1) Low SOLN, , Disp: , Rfl:    Blood Glucose Monitoring Suppl (TRUE METRIX METER) w/Device KIT, , Disp: , Rfl:    Carbinoxamine Maleate 4 MG TABS, Take 1 tablet (4 mg total) by mouth 2 (two) times daily., Disp: 60 tablet, Rfl: 1   cholecalciferol (VITAMIN D3) 25 MCG (1000 UNIT) tablet, Take 2,000 Units by mouth daily., Disp: , Rfl:    cycloSPORINE (RESTASIS) 0.05 % ophthalmic emulsion, Place 1 drop into both eyes 2 times daily., Disp: , Rfl:    empagliflozin (JARDIANCE) 10 MG TABS tablet, Take 1 tablet (10 mg total) by mouth daily before  breakfast., Disp: 30 tablet, Rfl: 3   EPINEPHrine 0.3 mg/0.3 mL IJ SOAJ injection, Inject 0.3 mg into the muscle as needed for anaphylaxis., Disp: 1 each, Rfl: 1   famotidine (PEPCID) 20 MG tablet, 20 mg 1-2 times daily., Disp: 60 tablet, Rfl: 5   fluticasone (FLONASE) 50 MCG/ACT nasal spray, Place 1 spray into both nostrils daily as needed for allergies or rhinitis., Disp: , Rfl:    hydrOXYzine (ATARAX) 25 MG tablet, Take 1 tablet (25 mg total) by mouth every 6 (six) hours., Disp: 12 tablet, Rfl: 0   levocetirizine (XYZAL) 5 MG tablet, Take 1 tablet (5 mg total) by mouth every evening., Disp: 30 tablet, Rfl: 5   metoprolol succinate (TOPROL-XL) 25 MG 24 hr tablet, Take 1 tablet (25 mg total) by mouth daily., Disp: 30 tablet, Rfl: 4   montelukast (SINGULAIR) 10 MG tablet, Take 1 tablet (10 mg total) by mouth daily., Disp: 30 tablet, Rfl: 2   Multiple Vitamin (MULTIVITAMIN WITH MINERALS) TABS tablet, Take 1 tablet by mouth daily., Disp: , Rfl:    ondansetron (ZOFRAN-ODT) 4 MG disintegrating tablet, Take 1 tablet (4 mg total) by mouth every 8 (eight) hours as needed for nausea or vomiting., Disp: 20 tablet, Rfl: 0   pantoprazole (PROTONIX) 40 MG tablet, Take 1 tablet (40 mg total) by mouth 2 (two) times daily., Disp: 60 tablet, Rfl: 5   pravastatin (PRAVACHOL) 20 MG tablet, Take 1 tablet (20 mg total) by mouth every evening., Disp: 30 tablet, Rfl: 11   sucralfate (CARAFATE) 1 g tablet, Take 1 g by mouth 4 (four) times daily. , Disp: , Rfl:    TRUE METRIX BLOOD GLUCOSE TEST test strip, 1 each 3 (three) times daily., Disp: , Rfl:    TRUEplus Lancets 33G MISC, Apply 1 each topically 3 (three) times daily., Disp: , Rfl:    gabapentin (NEURONTIN) 300 MG capsule, Take 300 mg by mouth 3 (three) times daily. (Patient not taking: Reported on 08/21/2023), Disp: , Rfl:    permethrin (ELIMITE) 5 % cream, Apply to affected area once. Allow to sit on skin for 8-14 hours, then wash off. (Patient not taking: Reported  on 08/21/2023), Disp: 60 g, Rfl: 1   simethicone (MYLICON) 40 MG/0.6ML drops, Take 0.6 mLs (40 mg total) by mouth with breakfast, with lunch, and with evening meal. (Patient not taking: Reported on 08/21/2023), Disp: 30 mL, Rfl: 0   Allergies  Allergen Reactions   Crab [Shellfish Allergy] Shortness Of Breath and Swelling   Erythromycin Swelling and Rash   Hm Lidocaine Patch [Lidocaine]     Burning on skin   Erdafitinib     Other reaction(s): pt not sure   Fish Allergy     Swelling    Metoclopramide Other (See Comments)    Slurred speech and unable to move muscles,  Caused her to drag her feet   Sulfacetamide     Other reaction(s): itching, rash   Sulfacetamide Sodium-Sulfur Other (See Comments)    Other reaction(s): itching, rash   Doxycycline     GI Intolerance   Levaquin [Levofloxacin In D5w] Other (See Comments)    Causes irregular heart beat   Morphine Nausea And Vomiting and Rash    Irregular heart beat   Sulfa Antibiotics Rash      The patient states she uses status post hysterectomy.  No LMP recorded. Patient has had a hysterectomy.   Negative for Dysmenorrhea and Negative for Menorrhagia. Negative for: breast discharge, breast lump(s), breast pain and breast self exam. Associated symptoms include abnormal vaginal bleeding. Pertinent negatives include abnormal bleeding (hematology), anxiety, decreased libido, depression, difficulty falling sleep, dyspareunia, history of infertility, nocturia, sexual dysfunction, sleep disturbances, urinary incontinence, urinary urgency, vaginal discharge and vaginal itching. Diet regular; her appetite varies sometimes. The patient states her exercise level is    . The patient's tobacco use is:  Social History   Tobacco Use  Smoking Status Former   Current packs/day: 0.00   Average packs/day: 0.5 packs/day for 30.0 years (15.0 ttl pk-yrs)   Types: Cigarettes   Start date: 12/08/1964   Quit date: 12/08/1994   Years since quitting: 28.7   Smokeless Tobacco Never   She has been exposed to passive smoke. The patient's alcohol use is:  Social History   Substance and Sexual Activity  Alcohol Use No    Review of Systems  Constitutional: Negative.   HENT: Negative.    Eyes: Negative.   Respiratory: Negative.    Cardiovascular: Negative.   Gastrointestinal: Negative.   Endocrine: Negative.   Genitourinary: Negative.   Musculoskeletal: Negative.   Skin: Negative.   Allergic/Immunologic: Negative.   Neurological: Negative.   Hematological: Negative.   Psychiatric/Behavioral: Negative.       Today's Vitals   08/21/23 1051  BP: 110/76  Pulse: 91  Temp: 98.5 F (36.9 C)  Weight: 192 lb 12.8 oz (87.5 kg)  Height: 5\' 6"  (1.676 m)   Body mass index is 31.12 kg/m.  Wt Readings from Last 3 Encounters:  08/21/23 192 lb 12.8 oz (87.5 kg)  08/17/23 203 lb 0.7 oz (92.1 kg)  07/22/23 203 lb 0.7 oz (92.1 kg)     Objective:  Physical Exam Vitals reviewed.  Constitutional:      General: She is not in acute distress.    Appearance: Normal appearance. She is well-developed. She is obese.  HENT:     Head: Normocephalic and atraumatic.     Right Ear: Hearing, tympanic membrane, ear canal and external ear normal. There is no impacted cerumen.     Left Ear: Hearing, tympanic membrane, ear canal and external ear normal. There is no impacted cerumen.     Nose: Nose normal.     Mouth/Throat:     Mouth: Mucous membranes are moist.  Eyes:     General: Lids are normal.     Extraocular Movements: Extraocular movements intact.     Conjunctiva/sclera: Conjunctivae normal.  Pupils: Pupils are equal, round, and reactive to light.     Funduscopic exam:    Right eye: No papilledema.        Left eye: No papilledema.  Neck:     Thyroid: No thyroid mass.     Vascular: No carotid bruit.  Cardiovascular:     Rate and Rhythm: Normal rate and regular rhythm.     Pulses: Normal pulses.     Heart sounds: Normal heart sounds. No  murmur heard. Pulmonary:     Effort: Pulmonary effort is normal. No respiratory distress.     Breath sounds: Normal breath sounds. No wheezing.  Chest:     Chest wall: No mass.  Breasts:    Tanner Score is 5.     Right: Normal. No mass or tenderness.     Left: Normal. No mass or tenderness.  Abdominal:     General: Abdomen is flat. Bowel sounds are normal. There is no distension.     Palpations: Abdomen is soft.     Tenderness: There is no abdominal tenderness.  Musculoskeletal:        General: No swelling or tenderness. Normal range of motion.     Cervical back: Full passive range of motion without pain, normal range of motion and neck supple.     Right lower leg: No edema.     Left lower leg: No edema.  Lymphadenopathy:     Upper Body:     Right upper body: No supraclavicular, axillary or pectoral adenopathy.     Left upper body: No supraclavicular, axillary or pectoral adenopathy.  Skin:    General: Skin is warm and dry.     Capillary Refill: Capillary refill takes less than 2 seconds.     Findings: No bruising.  Neurological:     General: No focal deficit present.     Mental Status: She is alert and oriented to person, place, and time.     Cranial Nerves: No cranial nerve deficit.     Sensory: No sensory deficit.  Psychiatric:        Mood and Affect: Mood normal.        Behavior: Behavior normal.        Thought Content: Thought content normal.        Judgment: Judgment normal.         Assessment And Plan:     Encounter for annual health examination Assessment & Plan: Behavior modifications discussed and diet history reviewed.   Pt will continue to exercise regularly and modify diet with low GI, plant based foods and decrease intake of processed foods.  Recommend intake of daily multivitamin, Vitamin D, and calcium.  Recommend mammogram and colonoscopy for preventive screenings, as well as recommend immunizations that include influenza, TDAP, and  Shingles    Type 2 diabetes mellitus with diabetic neuropathy, without long-term current use of insulin (HCC) -     EKG 12-Lead -     POCT URINALYSIS DIP (CLINITEK) -     Microalbumin / creatinine urine ratio -     CMP14+EGFR -     Hemoglobin A1c -     Lipid panel -     Pravastatin Sodium; Take 1 tablet (20 mg total) by mouth every evening.  Dispense: 30 tablet; Refill: 11 -     Empagliflozin; Take 1 tablet (10 mg total) by mouth daily before breakfast.  Dispense: 30 tablet; Refill: 3  Estrogen deficiency Assessment & Plan: Will check bone density  Herpes zoster vaccination declined Assessment & Plan: Declines shingrix, educated on disease process and is aware if he changes his mind to notify office    Influenza vaccination declined Assessment & Plan: Influenza vaccine administered Encouraged to take Tylenol as needed for fever or muscle aches.     Other long term (current) drug therapy -     CBC with Differential/Platelet  Diabetic peripheral neuropathy [facility]) Assessment & Plan: Will change her to Jardiance due to concerns about side effects.    Post-nasal drip  Muscle weakness (generalized) Assessment & Plan: Will refer her to PT to help with her muscle strength  Orders: -     CK -     Ambulatory referral to Physical Therapy    Return for 1 year HM; 3 month DM f/u.  Patient was given opportunity to ask questions. Patient verbalized understanding of the plan and was able to repeat key elements of the plan. All questions were answered to their satisfaction.   Pamela Felts, FNP  I, Pamela Felts, FNP, have reviewed all documentation for this visit. The documentation on 08/21/23 for the exam, diagnosis, procedures, and orders are all accurate and complete.

## 2023-08-22 LAB — LIPID PANEL
Chol/HDL Ratio: 2 ratio (ref 0.0–4.4)
Cholesterol, Total: 114 mg/dL (ref 100–199)
HDL: 56 mg/dL (ref >39)
LDL Chol Calc (NIH): 36 mg/dL (ref 0–99)
Triglycerides: 124 mg/dL (ref 0–149)
VLDL Cholesterol Cal: 22 mg/dL (ref 5–40)

## 2023-08-22 LAB — CBC WITH DIFFERENTIAL/PLATELET
Basophils Absolute: 0 10*3/uL (ref 0.0–0.2)
Basos: 1 %
EOS (ABSOLUTE): 0.1 10*3/uL (ref 0.0–0.4)
Eos: 1 %
Hematocrit: 46.4 % (ref 34.0–46.6)
Hemoglobin: 14.6 g/dL (ref 11.1–15.9)
Immature Grans (Abs): 0 10*3/uL (ref 0.0–0.1)
Immature Granulocytes: 0 %
Lymphocytes Absolute: 1.7 10*3/uL (ref 0.7–3.1)
Lymphs: 30 %
MCH: 28.3 pg (ref 26.6–33.0)
MCHC: 31.5 g/dL (ref 31.5–35.7)
MCV: 90 fL (ref 79–97)
Monocytes Absolute: 0.5 10*3/uL (ref 0.1–0.9)
Monocytes: 9 %
Neutrophils Absolute: 3.3 10*3/uL (ref 1.4–7.0)
Neutrophils: 59 %
Platelets: 290 10*3/uL (ref 150–450)
RBC: 5.16 x10E6/uL (ref 3.77–5.28)
RDW: 13.2 % (ref 11.7–15.4)
WBC: 5.6 10*3/uL (ref 3.4–10.8)

## 2023-08-22 LAB — CMP14+EGFR
ALT: 15 IU/L (ref 0–32)
AST: 16 IU/L (ref 0–40)
Albumin: 4.2 g/dL (ref 3.9–4.9)
Alkaline Phosphatase: 93 IU/L (ref 44–121)
BUN/Creatinine Ratio: 11 — ABNORMAL LOW (ref 12–28)
BUN: 9 mg/dL (ref 8–27)
Bilirubin Total: 0.6 mg/dL (ref 0.0–1.2)
CO2: 25 mmol/L (ref 20–29)
Calcium: 9.6 mg/dL (ref 8.7–10.3)
Chloride: 107 mmol/L — ABNORMAL HIGH (ref 96–106)
Creatinine, Ser: 0.83 mg/dL (ref 0.57–1.00)
Globulin, Total: 2.4 g/dL (ref 1.5–4.5)
Glucose: 111 mg/dL — ABNORMAL HIGH (ref 70–99)
Potassium: 5 mmol/L (ref 3.5–5.2)
Sodium: 146 mmol/L — ABNORMAL HIGH (ref 134–144)
Total Protein: 6.6 g/dL (ref 6.0–8.5)
eGFR: 78 mL/min/{1.73_m2} (ref >59)

## 2023-08-22 LAB — MICROALBUMIN / CREATININE URINE RATIO
Creatinine, Urine: 296.2 mg/dL
Microalb/Creat Ratio: 3 mg/g{creat} (ref 0–29)
Microalbumin, Urine: 9.7 ug/mL

## 2023-08-22 LAB — HEMOGLOBIN A1C
Est. average glucose Bld gHb Est-mCnc: 160 mg/dL
Hgb A1c MFr Bld: 7.2 % — ABNORMAL HIGH (ref 4.8–5.6)

## 2023-08-22 LAB — CK: Total CK: 63 U/L (ref 32–182)

## 2023-08-22 NOTE — ED Provider Notes (Signed)
[facility]   [id number] 08/17/23 Arrival Time: 1653  ASSESSMENT & PLAN:  1. Acute non-recurrent maxillary sinusitis   2. Epigastric pain    Begin: Meds ordered this encounter  Medications   amoxicillin (AMOXIL) 875 MG tablet    Sig: Take 1 tablet (875 mg total) by mouth 2 (two) times daily for 10 days.    Dispense:  20 tablet    Refill:  0   Benign abdomen. Trial of OTC pepcid.  Discussed typical duration of symptoms. OTC symptom care as needed. Ensure adequate fluid intake and rest.   Follow-up Information     [facility].   Specialty: Emergency Medicine Why: If symptoms worsen in any way. Contact information: [address] [place] [address] [phone]                Reviewed expectations re: course of current medical issues. Questions answered. Outlined signs and symptoms indicating need for more acute intervention. Patient verbalized understanding. After Visit Summary given.   SUBJECTIVE: History from: patient.  Pamela Roberson is a 67 y.o. female who presents with complaint of nasal congestion, post-nasal drainage, and sinus pain. Onset gradual,  1-2 weeks ago . Respiratory symptoms: none. Fever: absent. Overall normal PO intake without n/v. OTC treatment: none. Seasonal allergies: yes. Also mentions occasional epigastric discomfort. Worse after meals. Not currently feeling pain. Denies n/v/d. Normal PO intake today.  Social History   Tobacco Use  Smoking Status Former   Current packs/day: 0.00   Average packs/day: 0.5 packs/day for 30.0 years (15.0 ttl pk-yrs)   Types: Cigarettes   Start date: 12/08/1964   Quit date: 12/08/1994   Years since quitting: 28.7  Smokeless Tobacco Never   OBJECTIVE:  Vitals:   08/17/23 1807  BP: 126/81  Pulse: 85  Resp: 18  Temp: 98.6 F (37 C)  TempSrc: Oral  SpO2: 98%  Weight: 92.1 kg  Height: 5\' 6"  (1.676 m)     General  appearance: alert; no distress HEENT: nasal congestion; clear runny nose; throat irritation secondary to post-nasal drainage; bilateral maxillary tenderness to palpation; turbinates boggy Neck: supple without LAD; trachea midline Lungs: unlabored respirations, symmetrical air entry; cough: absent; no respiratory distress Abd: benign Skin: warm and dry Psychological: alert and cooperative; normal mood and affect  Allergies  Allergen Reactions   Crab [Shellfish Allergy] Shortness Of Breath and Swelling   Erythromycin Swelling and Rash   Hm Lidocaine Patch [Lidocaine]     Burning on skin   Erdafitinib     Other reaction(s): pt not sure   Fish Allergy     Swelling    Metoclopramide Other (See Comments)    Slurred speech and unable to move muscles,  Caused her to drag her feet   Sulfacetamide     Other reaction(s): itching, rash   Sulfacetamide Sodium-Sulfur Other (See Comments)    Other reaction(s): itching, rash   Doxycycline     GI Intolerance   Levaquin [Levofloxacin In D5w] Other (See Comments)    Causes irregular heart beat   Morphine Nausea And Vomiting and Rash    Irregular heart beat   Sulfa Antibiotics Rash    Past Medical History:  Diagnosis Date   Arthritis    Asthma    Carotid stenosis, bilateral    Carpal tunnel syndrome    Chronic low back pain    COPD (chronic obstructive pulmonary disease) (HCC)    Diabetes mellitus without complication (  HCC)    type II    Diabetic neuropathy (HCC)    Diverticulitis    Dyspnea    mild    Frequent falls    GERD (gastroesophageal reflux disease)    History of bronchitis    Hypercholesterolemia    Hypertension    IBS (irritable bowel syndrome)    Osteoarthritis    knee   Palpitations    Right ventricular enlargement    per pt report   Sleep apnea    mild but no sleep apnea    TMJ (dislocation of temporomandibular joint)    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    Vitamin D deficiency    Family History   Problem Relation Age of Onset   Allergic rhinitis Mother    Diabetes Father        type 2   Heart disease Father    Hypertension Father    Allergic rhinitis Father    Allergic rhinitis Sister    Collagen disease Sister    Heart disease Brother    Hypertension Brother    Angioedema Neg Hx    Asthma Neg Hx    Atopy Neg Hx    Eczema Neg Hx    Immunodeficiency Neg Hx    Urticaria Neg Hx    Social History   Socioeconomic History   Marital status: Widowed    Spouse name: Not on file   Number of children: 3   Years of education: Not on file   Highest education level: 11th grade  Occupational History    Comment: NA  Tobacco Use   Smoking status: Former    Current packs/day: 0.00    Average packs/day: 0.5 packs/day for 30.0 years (15.0 ttl pk-yrs)    Types: Cigarettes    Start date: 12/08/1964    Quit date: 12/08/1994    Years since quitting: 28.7   Smokeless tobacco: Never  Vaping Use   Vaping status: Never Used  Substance and Sexual Activity   Alcohol use: No   Drug use: No   Sexual activity: Not Currently  Other Topics Concern   Not on file  Social History Narrative   Lives with daughter   Caffeine- coffee 12 oz   Social Determinants of Health   Financial Resource Strain: Low Risk  (05/25/2023)   Overall Financial Resource Strain (CARDIA)    Difficulty of Paying Living Expenses: Not hard at all  Food Insecurity: No Food Insecurity (05/25/2023)   Hunger Vital Sign    Worried About Running Out of Food in the Last Year: Never true    Ran Out of Food in the Last Year: Never true  Transportation Needs: No Transportation Needs (05/25/2023)   PRAPARE - Administrator, Civil Service (Medical): No    Lack of Transportation (Non-Medical): No  Physical Activity: Sufficiently Active (05/25/2023)   Exercise Vital Sign    Days of Exercise per Week: 7 days    Minutes of Exercise per Session: 30 min  Stress: No Stress Concern Present (05/25/2023)   [employer]  of Occupational Health - Occupational Stress Questionnaire    Feeling of Stress : Not at all  Social Connections: Not on file  Intimate Partner Violence: Not At Risk (05/25/2023)   Humiliation, Afraid, Rape, and Kick questionnaire    Fear of Current or Ex-Partner: No    Emotionally Abused: No    Physically Abused: No    Sexually Abused: No  Mardella Layman, MD 08/22/23 1039

## 2023-08-25 DIAGNOSIS — M5416 Radiculopathy, lumbar region: Secondary | ICD-10-CM | POA: Diagnosis not present

## 2023-08-28 DIAGNOSIS — M5416 Radiculopathy, lumbar region: Secondary | ICD-10-CM | POA: Diagnosis not present

## 2023-08-30 ENCOUNTER — Ambulatory Visit: Payer: 59 | Admitting: Cardiology

## 2023-08-31 DIAGNOSIS — Z0000 Encounter for general adult medical examination without abnormal findings: Secondary | ICD-10-CM | POA: Insufficient documentation

## 2023-08-31 DIAGNOSIS — E2839 Other primary ovarian failure: Secondary | ICD-10-CM | POA: Insufficient documentation

## 2023-08-31 DIAGNOSIS — E1140 Type 2 diabetes mellitus with diabetic neuropathy, unspecified: Secondary | ICD-10-CM | POA: Insufficient documentation

## 2023-08-31 DIAGNOSIS — Z2821 Immunization not carried out because of patient refusal: Secondary | ICD-10-CM | POA: Insufficient documentation

## 2023-08-31 DIAGNOSIS — R0982 Postnasal drip: Secondary | ICD-10-CM | POA: Insufficient documentation

## 2023-08-31 DIAGNOSIS — M6281 Muscle weakness (generalized): Secondary | ICD-10-CM | POA: Insufficient documentation

## 2023-08-31 NOTE — Assessment & Plan Note (Signed)

## 2023-08-31 NOTE — Assessment & Plan Note (Signed)
Declines shingrix, educated on disease process and is aware if he changes his mind to notify office  

## 2023-08-31 NOTE — Assessment & Plan Note (Signed)
Will change her to Jardiance due to concerns about side effects.

## 2023-08-31 NOTE — Assessment & Plan Note (Signed)
Influenza vaccine administered Encouraged to take Tylenol as needed for fever or muscle aches.

## 2023-08-31 NOTE — Assessment & Plan Note (Signed)
Will refer her to PT to help with her muscle strength

## 2023-08-31 NOTE — Assessment & Plan Note (Signed)
Will check bone density

## 2023-09-04 ENCOUNTER — Other Ambulatory Visit: Payer: Self-pay

## 2023-09-04 ENCOUNTER — Other Ambulatory Visit: Payer: Self-pay | Admitting: Nurse Practitioner

## 2023-09-04 DIAGNOSIS — M5416 Radiculopathy, lumbar region: Secondary | ICD-10-CM | POA: Diagnosis not present

## 2023-09-04 DIAGNOSIS — E1140 Type 2 diabetes mellitus with diabetic neuropathy, unspecified: Secondary | ICD-10-CM

## 2023-09-04 MED ORDER — SEMAGLUTIDE(0.25 OR 0.5MG/DOS) 2 MG/3ML ~~LOC~~ SOPN
2.0000 mg | PEN_INJECTOR | SUBCUTANEOUS | 2 refills | Status: DC
Start: 2023-09-04 — End: 2023-09-05

## 2023-09-05 ENCOUNTER — Other Ambulatory Visit: Payer: Self-pay

## 2023-09-05 DIAGNOSIS — E1140 Type 2 diabetes mellitus with diabetic neuropathy, unspecified: Secondary | ICD-10-CM

## 2023-09-05 MED ORDER — SEMAGLUTIDE(0.25 OR 0.5MG/DOS) 2 MG/3ML ~~LOC~~ SOPN: 2.0000 mg | PEN_INJECTOR | SUBCUTANEOUS | 2 refills | Status: DC

## 2023-09-06 ENCOUNTER — Ambulatory Visit: Payer: 59

## 2023-09-06 ENCOUNTER — Ambulatory Visit
Admission: EM | Admit: 2023-09-06 | Discharge: 2023-09-06 | Disposition: A | Discharge status: Home / Self Care | Payer: 59 | Attending: Internal Medicine | Admitting: Internal Medicine

## 2023-09-06 ENCOUNTER — Other Ambulatory Visit: Payer: Self-pay | Admitting: Urgent Care

## 2023-09-06 DIAGNOSIS — J4540 Moderate persistent asthma, uncomplicated: Secondary | ICD-10-CM | POA: Diagnosis not present

## 2023-09-06 DIAGNOSIS — J329 Chronic sinusitis, unspecified: Secondary | ICD-10-CM | POA: Diagnosis not present

## 2023-09-06 DIAGNOSIS — E119 Type 2 diabetes mellitus without complications: Secondary | ICD-10-CM

## 2023-09-06 DIAGNOSIS — R0789 Other chest pain: Secondary | ICD-10-CM | POA: Diagnosis not present

## 2023-09-06 DIAGNOSIS — R053 Chronic cough: Secondary | ICD-10-CM

## 2023-09-06 DIAGNOSIS — R059 Cough, unspecified: Secondary | ICD-10-CM

## 2023-09-06 MED ORDER — PREDNISONE 20 MG PO TABS: ORAL_TABLET | ORAL | 0 refills | Status: DC

## 2023-09-06 MED ORDER — CEFDINIR 300 MG PO CAPS
300.0000 mg | ORAL_CAPSULE | Freq: Two times a day (BID) | ORAL | 0 refills | Status: DC
Start: 2023-09-06 — End: 2023-10-10

## 2023-09-06 NOTE — ED Provider Notes (Addendum)
[facility]  Note:  This document was prepared using Conservation officer, historic buildings and may include unintentional dictation errors.  MRN: [id number] DOB: 1956-10-31  Subjective:   Pamela Roberson is a 67 y.o. female presenting for 3 to 4-week history of persistent sinus congestion, sinus drainage, sinus pressure, throat pain, coughing, heaviness in her lungs, chest congestion.  Patient has a history of asthma, allergic rhinitis.  She takes Advair, levocetirizine.  Uses albuterol as needed.  She was seen 08/17/2023, started on amoxicillin for acute nonrecurrent maxillary sinusitis.  Patient took that antibiotic and did not notice any particular changes.  Patient quit smoking many years ago.  She does have type 2 diabetes treated without insulin.  Last A1c was 7.2% from this past month.  No current facility-administered medications for this encounter.  Current Outpatient Medications:    acyclovir (ZOVIRAX) 400 MG tablet, Take 1 tablet (400 mg total) by mouth 2 (two) times daily., Disp: 30 tablet, Rfl: 4   ADVAIR HFA 115-21 MCG/ACT inhaler, Inhale 2 puffs into the lungs 2 (two) times daily., Disp: 12 g, Rfl: 2   albuterol (PROAIR HFA) 108 (90 Base) MCG/ACT inhaler, Inhale 1-2 puffs into the lungs every 6 (six) hours as needed for wheezing or shortness of breath., Disp: 18 g, Rfl: 1   albuterol (PROVENTIL) (2.5 MG/3ML) 0.083% nebulizer solution, Take 3 mLs (2.5 mg total) by nebulization every 4 (four) hours as needed for wheezing or shortness of breath., Disp: 75 mL, Rfl: 1   amLODipine (NORVASC) 5 MG tablet, Take 1 tablet (5 mg total) by mouth daily., Disp: 30 tablet, Rfl: 1   Artificial Tear Ointment (DRY EYES OP), Place 1-2 drops into both eyes daily as needed (for dry eyes). , Disp: , Rfl:    ascorbic acid (VITAMIN C) 1000 MG tablet, Take 1 tablet by mouth daily., Disp: , Rfl:    azelastine (OPTIVAR) 0.05 % ophthalmic solution, Place 1 drop into both eyes 2  (two) times daily., Disp: 6 mL, Rfl: 5   Blood Glucose Calibration (TRUE METRIX LEVEL 1) Low SOLN, , Disp: , Rfl:    Blood Glucose Monitoring Suppl (TRUE METRIX METER) w/Device KIT, , Disp: , Rfl:    Carbinoxamine Maleate 4 MG TABS, Take 1 tablet (4 mg total) by mouth 2 (two) times daily., Disp: 60 tablet, Rfl: 1   cholecalciferol (VITAMIN D3) 25 MCG (1000 UNIT) tablet, Take 2,000 Units by mouth daily., Disp: , Rfl:    cycloSPORINE (RESTASIS) 0.05 % ophthalmic emulsion, Place 1 drop into both eyes 2 times daily., Disp: , Rfl:    empagliflozin (JARDIANCE) 10 MG TABS tablet, Take 1 tablet (10 mg total) by mouth daily before breakfast., Disp: 30 tablet, Rfl: 3   EPINEPHrine 0.3 mg/0.3 mL IJ SOAJ injection, Inject 0.3 mg into the muscle as needed for anaphylaxis., Disp: 1 each, Rfl: 1   famotidine (PEPCID) 20 MG tablet, 20 mg 1-2 times daily., Disp: 60 tablet, Rfl: 5   fluticasone (FLONASE) 50 MCG/ACT nasal spray, Place 1 spray into both nostrils daily as needed for allergies or rhinitis., Disp: , Rfl:    gabapentin (NEURONTIN) 300 MG capsule, Take 300 mg by mouth 3 (three) times daily. (Patient not taking: Reported on 08/21/2023), Disp: , Rfl:    hydrOXYzine (ATARAX) 25 MG tablet, Take 1 tablet (25 mg total) by mouth every 6 (six) hours., Disp: 12 tablet, Rfl: 0   levocetirizine (XYZAL) 5 MG tablet, Take 1 tablet (5 mg total) by  mouth every evening., Disp: 30 tablet, Rfl: 5   metoprolol succinate (TOPROL-XL) 25 MG 24 hr tablet, Take 1 tablet (25 mg total) by mouth daily., Disp: 30 tablet, Rfl: 4   montelukast (SINGULAIR) 10 MG tablet, Take 1 tablet (10 mg total) by mouth daily., Disp: 30 tablet, Rfl: 2   Multiple Vitamin (MULTIVITAMIN WITH MINERALS) TABS tablet, Take 1 tablet by mouth daily., Disp: , Rfl:    ondansetron (ZOFRAN-ODT) 4 MG disintegrating tablet, Take 1 tablet (4 mg total) by mouth every 8 (eight) hours as needed for nausea or vomiting., Disp: 20 tablet, Rfl: 0   pantoprazole (PROTONIX) 40  MG tablet, Take 1 tablet (40 mg total) by mouth 2 (two) times daily., Disp: 60 tablet, Rfl: 5   permethrin (ELIMITE) 5 % cream, Apply to affected area once. Allow to sit on skin for 8-14 hours, then wash off. (Patient not taking: Reported on 08/21/2023), Disp: 60 g, Rfl: 1   pravastatin (PRAVACHOL) 20 MG tablet, Take 1 tablet (20 mg total) by mouth every evening., Disp: 30 tablet, Rfl: 11   Semaglutide,0.25 or 0.5MG /DOS, 2 MG/3ML SOPN, Inject 2 mg into the skin once a week. Inject 0.25mg  for 4 weeks after increase to 0.5mg  for 4 weeks., Disp: 3 mL, Rfl: 2   simethicone (MYLICON) 40 MG/0.6ML drops, Take 0.6 mLs (40 mg total) by mouth with breakfast, with lunch, and with evening meal. (Patient not taking: Reported on 08/21/2023), Disp: 30 mL, Rfl: 0   sucralfate (CARAFATE) 1 g tablet, Take 1 g by mouth 4 (four) times daily. , Disp: , Rfl:    TRUE METRIX BLOOD GLUCOSE TEST test strip, 1 each 3 (three) times daily., Disp: , Rfl:    TRUEplus Lancets 33G MISC, Apply 1 each topically 3 (three) times daily., Disp: , Rfl:    Allergies  Allergen Reactions   Crab [Shellfish Allergy] Shortness Of Breath and Swelling   Erythromycin Swelling and Rash   Hm Lidocaine Patch [Lidocaine]     Burning on skin   Erdafitinib     Other reaction(s): pt not sure   Fish Allergy     Swelling    Metoclopramide Other (See Comments)    Slurred speech and unable to move muscles,  Caused her to drag her feet   Sulfacetamide     Other reaction(s): itching, rash   Sulfacetamide Sodium-Sulfur Other (See Comments)    Other reaction(s): itching, rash   Doxycycline     GI Intolerance   Levaquin [Levofloxacin In D5w] Other (See Comments)    Causes irregular heart beat   Morphine Nausea And Vomiting and Rash    Irregular heart beat   Sulfa Antibiotics Rash    Past Medical History:  Diagnosis Date   Arthritis    Asthma    Carotid stenosis, bilateral    Carpal tunnel syndrome    Chronic low back pain    COPD (chronic  obstructive pulmonary disease) (HCC)    Diabetes mellitus without complication (HCC)    type II    Diabetic neuropathy (HCC)    Diverticulitis    Dyspnea    mild    Frequent falls    GERD (gastroesophageal reflux disease)    History of bronchitis    Hypercholesterolemia    Hypertension    IBS (irritable bowel syndrome)    Osteoarthritis    knee   Palpitations    Right ventricular enlargement    per pt report   Sleep apnea    mild but  no sleep apnea    TMJ (dislocation of temporomandibular joint)    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    Vitamin D deficiency      Past Surgical History:  Procedure Laterality Date   ABDOMINAL HYSTERECTOMY  1993   CARPAL TUNNEL RELEASE Right    CERVICAL DISC SURGERY  04/29/2015   Dr [place]rie [place]   ESOPHAGEAL MANOMETRY N/A 03/03/2021   Procedure: ESOPHAGEAL MANOMETRY (EM);  Surgeon: Charlott Rakes, MD;  Location: [facility];  Service: Endoscopy;  Laterality: N/A;   ESOPHAGOGASTRODUODENOSCOPY ENDOSCOPY     with esophagus being stretched twice per pt,    EYE SURGERY     fatty 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      feeding tube placement and removal   2015   NASAL SEPTUM SURGERY     ROTATOR CUFF REPAIR Right    spurs removed from esophagus   2015   TONSILLECTOMY      Family History  Problem Relation Age of Onset   Allergic rhinitis Mother    Diabetes Father        type 2   Heart disease Father    Hypertension Father    Allergic rhinitis Father    Allergic rhinitis Sister    Collagen disease Sister    Heart disease Brother    Hypertension Brother    Angioedema Neg Hx    Asthma Neg Hx    Atopy Neg Hx    Eczema Neg Hx    Immunodeficiency Neg Hx    Urticaria Neg Hx     Social History   Tobacco Use   Smoking status: Former    Current packs/day: 0.00    Average packs/day: 0.5 packs/day for 30.0 years (15.0 ttl pk-yrs)    Types: Cigarettes    Start date: 12/08/1964    Quit date: 12/08/1994    Years since quitting:  28.7   Smokeless tobacco: Never  Vaping Use   Vaping status: Never Used  Substance Use Topics   Alcohol use: No   Drug use: No    ROS   Objective:   Vitals: BP 135/85 (BP Location: Left Arm)   Pulse 91   Temp 98.7 F (37.1 C) (Oral)   Resp 18   SpO2 96%   Physical Exam Constitutional:      General: She is not in acute distress.    Appearance: Normal appearance. She is well-developed and normal weight. She is not ill-appearing, toxic-appearing or diaphoretic.  HENT:     Head: Normocephalic and atraumatic.     Right Ear: Tympanic membrane, ear canal and external ear normal. No drainage or tenderness. No middle ear effusion. There is no impacted cerumen. Tympanic membrane is not erythematous or bulging.     Left Ear: Tympanic membrane, ear canal and external ear normal. No drainage or tenderness.  No middle ear effusion. There is no impacted cerumen. Tympanic membrane is not erythematous or bulging.     Nose: Congestion and rhinorrhea present.     Mouth/Throat:     Mouth: Mucous membranes are moist. No oral lesions.     Pharynx: No pharyngeal swelling, oropharyngeal exudate, posterior oropharyngeal erythema or uvula swelling.     Tonsils: No tonsillar exudate or tonsillar abscesses.  Eyes:     General: No scleral icterus.       Right eye: No discharge.        Left eye: No discharge.     Extraocular Movements: Extraocular movements  intact.     Right eye: Normal extraocular motion.     Left eye: Normal extraocular motion.     Conjunctiva/sclera: Conjunctivae normal.  Cardiovascular:     Rate and Rhythm: Normal rate and regular rhythm.     Heart sounds: Normal heart sounds. No murmur heard.    No friction rub. No gallop.  Pulmonary:     Effort: Pulmonary effort is normal. No respiratory distress.     Breath sounds: No stridor. No wheezing, rhonchi or rales.     Comments: Slight decrease in lung sounds. Chest:     Chest wall: No tenderness.  Musculoskeletal:      Cervical back: Normal range of motion and neck supple.  Lymphadenopathy:     Cervical: No cervical adenopathy.  Skin:    General: Skin is warm and dry.  Neurological:     General: No focal deficit present.     Mental Status: She is alert and oriented to person, place, and time.  Psychiatric:        Mood and Affect: Mood normal.        Behavior: Behavior normal.      Assessment and Plan :   PDMP not reviewed this encounter.  1. Persistent cough for 3 weeks or longer   2. Moderate persistent asthma without complication   3. Type 2 diabetes mellitus treated without insulin (HCC)    X-ray over-read was pending at time of discharge, recommended follow up with only abnormal results. Otherwise will not call for negative over-read. Patient was in agreement.  Will update and finalize treatment plan with final over read.  For now maintain all other medications, Advair, albuterol, levocetirizine.  Will provide with prescription for supportive care.  Counseled patient on potential for adverse effects with medications prescribed/recommended today, ER and return-to-clinic precautions discussed, patient verbalized understanding.    Wallis Bamberg, PA-C 09/06/23 1257   UPDATE: DG Chest 2 View  Result Date: 09/06/2023 CLINICAL DATA:  cough, chest heaviness EXAM: CHEST - 2 VIEW COMPARISON:  08/17/2023 FINDINGS: The heart size and mediastinal contours are within normal limits. Both lungs are clear. The visualized skeletal structures are unremarkable. IMPRESSION: No active cardiopulmonary disease. Electronically Signed   By: Judie Petit.  Shick M.D.   On: 09/06/2023 13:28    1. Persistent cough for 3 weeks or longer   2. Moderate persistent asthma without complication   3. Type 2 diabetes mellitus treated without insulin (HCC)   4. Recurrent sinusitis     Recommend cefdinir for persistent sinusitis as she failed resolution with amoxicillin.  Also recommended a prednisone course given ongoing respiratory  symptoms in the context of her allergic rhinitis and asthma.  It is my best clinical judgment that the risks outweigh the benefits especially since she has not responded to conservative measures and antibiotics.  Had an extensive discussion with patient about limiting her carbohydrates and starchy foods in an effort to maintain reasonable blood sugar levels while she is on prednisone.  She is to maintain all other chronic medications.   Wallis Bamberg, [place] 09/06/23 1341

## 2023-09-06 NOTE — ED Triage Notes (Signed)
Pt c/o dry cough, sore throat, sinus pressure, nasal congestion, and headaches x2wks. Taking OTC with no relief.

## 2023-09-06 NOTE — Discharge Instructions (Addendum)
Your x-ray results were negative.  You do not have pneumonia, bronchitis or fluid in your lungs.  I believe you have an ongoing persistent sinus infection that is aggravating your allergies and asthma.  Because you already took amoxicillin, I want you to take a different antibiotic and cefdinir.  Make sure you finish the 10 days of this antibiotic.  As you continue to have respiratory symptoms I also want you to use a prednisone course.  This medication can increase your blood sugar levels and therefore I want you to follow my diet recommendations strictly as written below.   For diabetes or elevated blood sugar, please make sure you are limiting and avoiding starchy, carbohydrate foods like pasta, breads, sweet breads, pastry, rice, potatoes, desserts. These foods can elevate your blood sugar. Also, limit and avoid drinks that contain a lot of sugar such as sodas, sweet teas, fruit juices.  Drinking plain water will be much more helpful, try 64 ounces of water daily.  It is okay to flavor your water naturally by cutting cucumber, lemon, mint or lime, placing it in a picture with water and drinking it over a period of 24-48 hours as long as it remains refrigerated.  For elevated blood pressure, make sure you are monitoring salt in your diet.  Do not eat restaurant foods and limit processed foods at home. I highly recommend you prepare and cook your own foods at home.  Processed foods include things like frozen meals, pre-seasoned meats and dinners, deli meats, canned foods as these foods contain a high amount of sodium/salt.  Make sure you are paying attention to sodium labels on foods you buy at the grocery store. Buy your spices separately such as garlic powder, onion powder, cumin, cayenne, parsley flakes so that you can avoid seasonings that contain salt. However, salt-free seasonings are available and can be used, an example is Mrs. Dash and includes a lot of different mixtures that do not contain  salt.  Lastly, when cooking using oils that are healthier for you is important. This includes olive oil, avocado oil, canola oil. We have discussed a lot of foods to avoid but below is a list of foods that can be very healthy to use in your diet whether it is for diabetes, cholesterol, high blood pressure, or in general healthy eating.  Salads - kale, spinach, cabbage, spring mix, arugula Fruits - avocadoes, berries (blueberries, raspberries, blackberries), apples, oranges, pomegranate, grapefruit, kiwi Vegetables - asparagus, cauliflower, broccoli, green beans, brussel sprouts, bell peppers, beets; stay away from or limit starchy vegetables like potatoes, carrots, peas Other general foods - kidney beans, egg whites, almonds, walnuts, sunflower seeds, pumpkin seeds, fat free yogurt, almond milk, flax seeds, quinoa, oats  Meat - It is better to eat lean meats and limit your red meat including pork to once a week.  Wild caught fish, chicken breast are good options as they tend to be leaner sources of good protein. Still be mindful of the sodium labels for the meats you buy.  DO NOT EAT ANY FOODS ON THIS LIST THAT YOU ARE ALLERGIC TO. For more specific needs, I highly recommend consulting a dietician or nutritionist but this can definitely be a good starting point.

## 2023-09-07 ENCOUNTER — Ambulatory Visit: Payer: 59 | Admitting: Allergy

## 2023-09-11 ENCOUNTER — Ambulatory Visit: Payer: 59

## 2023-09-11 DIAGNOSIS — E1142 Type 2 diabetes mellitus with diabetic polyneuropathy: Secondary | ICD-10-CM

## 2023-09-11 DIAGNOSIS — M2012 Hallux valgus (acquired), left foot: Secondary | ICD-10-CM

## 2023-09-11 DIAGNOSIS — M2141 Flat foot [pes planus] (acquired), right foot: Secondary | ICD-10-CM

## 2023-09-11 DIAGNOSIS — L84 Corns and callosities: Secondary | ICD-10-CM

## 2023-09-11 NOTE — Progress Notes (Signed)
Patient presents to the office today for diabetic shoe and insole measuring.  Patient was measured with brannock device to determine size and width for 1 pair of extra depth shoes and foam casted for 3 pair of insoles.   Documentation of medical necessity will be sent to patient's treating diabetic doctor to verify and sign.   Patient's diabetic provider: [name] NP   Shoes and insoles will be ordered at that time and patient will be notified for an appointment for fitting when they arrive.   Shoe size (per patient): 9 Brannock measurement: Right 8 Left 9 Patient shoe selection- Shoe choice:   [id number] Maxie Barb / [email or web address] Shoe size ordered: 9WD  ABN and financials signed

## 2023-09-13 ENCOUNTER — Ambulatory Visit (INDEPENDENT_AMBULATORY_CARE_PROVIDER_SITE_OTHER): Payer: 59 | Admitting: Internal Medicine

## 2023-09-13 ENCOUNTER — Other Ambulatory Visit: Payer: Self-pay

## 2023-09-13 ENCOUNTER — Encounter: Payer: Self-pay | Admitting: Internal Medicine

## 2023-09-13 VITALS — BP 120/64 | HR 88 | Temp 98.6°F | Wt 195.8 lb

## 2023-09-13 DIAGNOSIS — R040 Epistaxis: Secondary | ICD-10-CM

## 2023-09-13 DIAGNOSIS — J310 Chronic rhinitis: Secondary | ICD-10-CM | POA: Diagnosis not present

## 2023-09-13 DIAGNOSIS — K219 Gastro-esophageal reflux disease without esophagitis: Secondary | ICD-10-CM

## 2023-09-13 DIAGNOSIS — J329 Chronic sinusitis, unspecified: Secondary | ICD-10-CM | POA: Diagnosis not present

## 2023-09-13 DIAGNOSIS — J4540 Moderate persistent asthma, uncomplicated: Secondary | ICD-10-CM

## 2023-09-13 MED ORDER — FLUTICASONE PROPIONATE 50 MCG/ACT NA SUSP: 1.0000 | Freq: Every day | NASAL | 5 refills | Status: DC

## 2023-09-13 MED ORDER — PANTOPRAZOLE SODIUM 40 MG PO TBEC: 40.0000 mg | DELAYED_RELEASE_TABLET | Freq: Every day | ORAL | 5 refills | Status: DC

## 2023-09-13 MED ORDER — ALBUTEROL SULFATE HFA 108 (90 BASE) MCG/ACT IN AERS: 1.0000 | INHALATION_SPRAY | Freq: Four times a day (QID) | RESPIRATORY_TRACT | 1 refills | Status: DC | PRN

## 2023-09-13 MED ORDER — AZELASTINE HCL 0.1 % NA SOLN: 1.0000 | Freq: Two times a day (BID) | NASAL | 5 refills | Status: DC | PRN

## 2023-09-13 MED ORDER — LEVOCETIRIZINE DIHYDROCHLORIDE 5 MG PO TABS: 5.0000 mg | ORAL_TABLET | Freq: Every evening | ORAL | 5 refills | Status: DC

## 2023-09-13 MED ORDER — ADVAIR HFA 115-21 MCG/ACT IN AERO: 2.0000 | INHALATION_SPRAY | Freq: Two times a day (BID) | RESPIRATORY_TRACT | 5 refills | Status: DC

## 2023-09-13 MED ORDER — MONTELUKAST SODIUM 10 MG PO TABS: 10.0000 mg | ORAL_TABLET | Freq: Every day | ORAL | 5 refills | Status: DC

## 2023-09-13 NOTE — Patient Instructions (Addendum)
Moderate Persistent Asthma: - Maintenance inhaler: continue Advair 115-63mcg 2 puffs twice daily.  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 - Use nasal saline rinses before nose sprays such as with Neilmed Sinus Rinse.  Use distilled water.   - Use Flonase 1-2 sprays each nostril daily. Aim upward and outward. - Use Azelastine 1-2 sprays each nostril twice daily as needed for runny nose, drainage, sneezing, congestion. Aim upward and outward. - Use Xyzal 5 mg daily.  - Use Singulair 10mg  daily.   Stop if there are any mood/behavioral changes. - Use Azelastine 1 eye drops daily as needed for itchy, watery eyes. For itchy, dry eyes, use a lubricating eye drop.     Recurrent Sinus Infections Recurrent Epistaxis - Will refer to ENT for further evaluation.   History of Food Allergies - For now avoid seafood.  - Previously with negative fish and shellfish panel.  If interested in reintroduction, please schedule a challenge.  - for SKIN only reaction, okay to take Benadryl 25mg  capsules every 6 hours as needed - for SKIN + ANY additional symptoms, OR IF concern for LIFE THREATENING reaction = Epipen Autoinjector EpiPen 0.3 mg. - If using Epinephrine autoinjector, call 911   GERD - Continue Protonix 40mg  daily. Take this on empty stomach.  -Avoid lying down for at least two hours after a meal or after drinking acidic beverages, like soda, or other caffeinated  beverages. This can help to prevent stomach contents from flowing back into the esophagus. -Keep your head elevated while you sleep. Using an extra pillow or two can also help to prevent reflux. -Eat smaller and more frequent meals each day instead of a few large meals. This promotes digestion and can aid in preventing heartburn. -Wear loose-fitting clothes to ease pressure on the stomach, which can worsen heartburn and reflux. -Reduce excess weight around the midsection. This can ease pressure on the stomach. Such pressure can force some stomach contents back up the esophagus.    Follow up with Dr. Delorse Lek in 4 months.

## 2023-09-13 NOTE — Progress Notes (Signed)
FOLLOW UP Date of Service/Encounter:  09/13/23   Subjective:  Pamela Roberson (DOB: 1956/01/23) is a 67 y.o. female who [facility] for follow up for asthma, rhinitis, food allergies, recurrent infections, reflux.   History obtained from: chart review and patient. Last seen by Dr. Delorse Lek on 01/12/2023 for moderate persistent asthma on Wixela, Singulair, Allergic rhinitis on Ryaltris/Xyzal, food allergies with negative skin and sIgE testing to fish/shellfish-discussed challenge and reflux on Protonix/Pepcid.   Since last visit, she reports still having trouble with her sinuses.  She has a lot of congestion to the point she feels like she can't breathe, headaches, drainage. Also has noted frequent nosebleeds, mostly on L side.  She wishes to see ENT and has not seen them previously.  Tried Ryaltris but was too strong for her and wishes to go back to her older nose sprays that were split.  Currently not using any nose sprays.  Does have Xyzal PRN, not using that either.  On Singulair.  She reports seeing urgent care for sinus infections x2 recently and has been on Amoxicillin and Cefdinir with minimal improvement. Also gets itchy eyes but sometimes they are dry.  Using Azelastine eye drops PRN.  Rica Records in the past with minimal relief.   In terms of asthma, she is doing alright.  Not much issues with SOB/wheeze.  Was seen in the ER on 10/2 for cough, congestion, drainage, sinus pressure, heaviness in lungs. No wheezing on exam. CXR normal. Discussed continuation of allergy and asthma meds at home.   Reports reflux is doing okay. Taking Protonix daily. Has not needed Pepcid. Denies heartburn, sour taste in mouth, nighttime cough.   Avoids seafood. Has not scheduled a challenge as she wishes to figure out her sinus issues.   Past Medical History: Past Medical History:  Diagnosis Date   Arthritis    Asthma    Carotid stenosis, bilateral     Carpal tunnel syndrome    Chronic low back pain    COPD (chronic obstructive pulmonary disease) (HCC)    Diabetes mellitus without complication (HCC)    type II    Diabetic neuropathy (HCC)    Diverticulitis    Dyspnea    mild    Frequent falls    GERD (gastroesophageal reflux disease)    History of bronchitis    Hypercholesterolemia    Hypertension    IBS (irritable bowel syndrome)    Osteoarthritis    knee   Palpitations    Right ventricular enlargement    per pt report   Sleep apnea    mild but no sleep apnea    TMJ (dislocation of temporomandibular joint)    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 Physical Exam: GEN: alert, well developed HEENT: clear conjunctiva, TM grey and translucent, nose without inferior turbinate hypertrophy, pink nasal mucosa, no rhinorrhea, slight cobblestoning HEART: regular rate and rhythm, no murmur LUNGS: clear to auscultation bilaterally, no coughing, unlabored respiration SKIN: no rashes or lesions  Spirometry:  Tracings reviewed. Her effort: Good reproducible efforts. FVC: 2.53L FEV1: 1.77L, 84% predicted FEV1/FVC ratio: 70% Interpretation: Spirometry consistent with normal pattern.  Please see scanned spirometry results for details.   Assessment:   1. Moderate persistent asthma, uncomplicated   2. Mixed rhinitis   3. Recurrent sinus infections   4. Gastroesophageal reflux disease, unspecified whether esophagitis present   5. Recurrent epistaxis     Plan/Recommendations:  Moderate Persistent Asthma: - Controlled, spirometry today was normal. MDI teaching done.  - Maintenance inhaler: continue Advair 115-14mcg 2 puffs twice daily.  - Rescue inhaler: Albuterol 2 puffs via spacer or 1 vial via nebulizer every 4-6 hours as needed for  respiratory symptoms of cough, shortness of breath, or wheezing Asthma control goals:  Full participation in all desired activities (may need albuterol before activity) Albuterol use two times or less a week on average (not counting use with activity) Cough interfering with sleep two times or less a month Oral steroids no more than once a year No hospitalizations    Mixed Rhinitis: - Uncontrolled, discussed restarting nasal regimen with daily INCS + OAH and PRN Azelastine - Positive skin test 01/2021: one grass pollen, tobacco leaf  - Avoidance measures discussed. - Use nasal saline rinses before nose sprays such as with Neilmed Sinus Rinse.  Use distilled water.   - Use Flonase 1-2 sprays each nostril daily. Aim upward and outward. - Use Azelastine 1-2 sprays each nostril twice daily as needed for runny nose, drainage, sneezing, congestion. Aim upward and outward. - Use Xyzal 5 mg daily.  - Use Singulair 10mg  daily.   Stop if there are any mood/behavioral changes. - Use Azelastine 1 eye drops daily as needed for itchy, watery eyes. For itchy, dry eyes, use a lubricating eye drop.     Recurrent Sinus Infections Recurrent Epistaxis - 01/2023: normal Ig, CH50, tetanus/diptheria titers; low S pneumo titers.  Received Pneumovax 03/30/2023. Repeat tiers 07/24/2023 with normal titers.  - Will refer to ENT for further evaluation.   History of Food Allergies - For now avoid seafood.  - Previously with negative fish and shellfish panel (both sIgE and SPT).  If interested in reintroduction, please schedule a challenge.  - for SKIN only reaction, okay to take Benadryl 25mg  capsules every 6 hours as needed - for SKIN + ANY additional symptoms, OR IF concern for LIFE THREATENING reaction = Epipen Autoinjector EpiPen 0.3 mg. - If using Epinephrine autoinjector, call 911   GERD - Continue Protonix 40mg  daily. Take this on empty stomach.  -Avoid lying down for at least two hours after a meal or  after drinking acidic beverages, like soda, or other caffeinated beverages. This can help to prevent stomach contents from flowing back into the esophagus. -Keep your head elevated while you sleep. Using an extra pillow or two can also help to prevent reflux. -Eat smaller and more frequent meals each day instead of a few large meals. This promotes digestion and can aid in preventing heartburn. -Wear loose-fitting clothes to ease pressure on the stomach, which can worsen heartburn and reflux. -Reduce excess weight around the midsection. This can ease pressure on the stomach. Such pressure can force some stomach contents back up the esophagus.    Follow up with Dr. Delorse Lek in 4 months.       Return in about 4 months (around 01/14/2024).  Alesia Morin, MD [facility]

## 2023-09-16 ENCOUNTER — Other Ambulatory Visit: Payer: Self-pay | Admitting: Allergy

## 2023-09-20 ENCOUNTER — Other Ambulatory Visit (HOSPITAL_COMMUNITY): Payer: Self-pay

## 2023-09-20 NOTE — Telephone Encounter (Signed)
Pamela Roberson called in asking if she could get Carbinoxamine refills.  Pamela Roberson states these really help her.  She uses [facility] on [address]

## 2023-09-21 ENCOUNTER — Telehealth: Payer: Self-pay

## 2023-09-21 NOTE — Telephone Encounter (Signed)
-----   Message from Birder Robson sent at 09/13/2023 12:04 PM EDT ----- Raiford Simmonds, can we please get her in with ENT?  Thanks!

## 2023-09-21 NOTE — Telephone Encounter (Signed)
Referral has been placed to Dr. Suszanne Conners as he is not with [facility].   Thanks

## 2023-09-22 ENCOUNTER — Other Ambulatory Visit: Payer: Self-pay | Admitting: Allergy

## 2023-09-28 ENCOUNTER — Ambulatory Visit
Admission: EM | Admit: 2023-09-28 | Discharge: 2023-09-28 | Disposition: A | Discharge status: Home / Self Care | Payer: 59 | Attending: Internal Medicine | Admitting: Internal Medicine

## 2023-09-28 DIAGNOSIS — Z1152 Encounter for screening for COVID-19: Secondary | ICD-10-CM | POA: Insufficient documentation

## 2023-09-28 DIAGNOSIS — B9789 Other viral agents as the cause of diseases classified elsewhere: Secondary | ICD-10-CM | POA: Insufficient documentation

## 2023-09-28 DIAGNOSIS — Z87891 Personal history of nicotine dependence: Secondary | ICD-10-CM | POA: Diagnosis not present

## 2023-09-28 DIAGNOSIS — J069 Acute upper respiratory infection, unspecified: Secondary | ICD-10-CM | POA: Diagnosis not present

## 2023-09-28 DIAGNOSIS — R051 Acute cough: Secondary | ICD-10-CM

## 2023-09-28 DIAGNOSIS — L309 Dermatitis, unspecified: Secondary | ICD-10-CM | POA: Insufficient documentation

## 2023-09-28 DIAGNOSIS — R059 Cough, unspecified: Secondary | ICD-10-CM | POA: Diagnosis present

## 2023-09-28 MED ORDER — PROMETHAZINE-DM 6.25-15 MG/5ML PO SYRP
2.5000 mL | ORAL_SOLUTION | Freq: Four times a day (QID) | ORAL | 0 refills | Status: DC | PRN
Start: 2023-09-28 — End: 2024-02-06

## 2023-09-28 NOTE — ED Triage Notes (Signed)
Pt present with c/o sob, headaches and sneezing x 3 days. Pt states she woke up this morning with a rash. Pt states her grandson had scabies months ago and is concerned she has scabies.

## 2023-09-28 NOTE — ED Provider Notes (Signed)
[facility]    CSN: [id number] Arrival date & time: 09/28/23  0840      History   Chief Complaint No chief complaint on file.   HPI Pamela Roberson is a 67 y.o. female  presents for evaluation of URI symptoms for 3 days. Patient reports associated symptoms of cough, congestion, headaches, sore throat, sneezing. Denies N/V/D, fevers, ear pain, body aches.  Nurses intake note states patient complained of shortness of breath, when asked patient denies shortness of breath.  Patient does have a hx of asthma.  She has been using her inhalers as prescribed.  Reports no sick contacts.  In addition she states she got scabies from her grandson several months ago and was treated 3 times with resolution of symptoms.  She states this morning she woke with some red itchiness on the dorsum of her left wrist and is concerned that scabies again denies any rashes or excoriation in other areas.  No history of eczema or psoriasis.  Pt has taken nothing OTC for symptoms. Pt has no other concerns at this time.   HPI  Past Medical History:  Diagnosis Date   Arthritis    Asthma    Carotid stenosis, bilateral    Carpal tunnel syndrome    Chronic low back pain    COPD (chronic obstructive pulmonary disease) (HCC)    Diabetes mellitus without complication (HCC)    type II    Diabetic neuropathy (HCC)    Diverticulitis    Dyspnea    mild    Frequent falls    GERD (gastroesophageal reflux disease)    History of bronchitis    Hypercholesterolemia    Hypertension    IBS (irritable bowel syndrome)    Osteoarthritis    knee   Palpitations    Right ventricular enlargement    per pt report   Sleep apnea    mild but no sleep apnea    TMJ (dislocation of temporomandibular joint)    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    Vitamin D deficiency     Patient Active Problem List   Diagnosis Date Noted   Encounter for annual health examination 08/31/2023   Type 2 diabetes mellitus with  diabetic neuropathy, without long-term current use of insulin (HCC) 08/31/2023   Estrogen deficiency 08/31/2023   Herpes zoster vaccination declined 08/31/2023   Influenza vaccination declined 08/31/2023   Post-nasal drip 08/31/2023   Muscle weakness (generalized) 08/31/2023   Degeneration of lumbar intervertebral disc 07/26/2023   Diabetic peripheral neuropathy (HCC) 07/26/2023   Left leg weakness 07/26/2023   Lumbar radiculopathy 07/26/2023   Pain in left knee 02/15/2022   Sciatica, left side 02/15/2022   Pain in left shoulder 11/16/2021   Internal hemorrhoids 11/03/2021   Leg cramps 11/03/2021   Myalgia 11/03/2021   Adenomatous polyp of colon 12/04/2020   Anal fissure 12/04/2020   Chronic idiopathic constipation 12/04/2020   Constipation 12/04/2020   Dysphagia 12/04/2020   Gastroesophageal reflux disease 12/04/2020   Rectal pain 12/04/2020   Right upper quadrant pain 12/04/2020   Dizzy 12/11/2017   Pharyngeal dysphagia 12/11/2017   Referred ear pain, bilateral 12/11/2017   Diabetes mellitus without complication (HCC) 06/29/2017   Family history of glaucoma 06/29/2017   Hyperopia of both eyes with astigmatism and presbyopia 06/29/2017   Keratoconjunctivitis sicca of both eyes not specified as Sjogren's 06/29/2017   Nuclear sclerotic cataract of both eyes 06/29/2017   Vitreous floater, bilateral 06/29/2017  Biceps tendinitis of right shoulder 04/05/2017   Impingement syndrome of right shoulder 04/05/2017   Arthritis of right acromioclavicular joint 04/05/2017   Partial nontraumatic tear of rotator cuff, right 04/05/2017    Past Surgical History:  Procedure Laterality Date   ABDOMINAL HYSTERECTOMY  1993   CARPAL TUNNEL RELEASE Right    CERVICAL DISC SURGERY  04/29/2015   Dr [place]rie [place]   ESOPHAGEAL MANOMETRY N/A 03/03/2021   Procedure: ESOPHAGEAL MANOMETRY (EM);  Surgeon: Charlott Rakes, MD;  Location: [facility];  Service: Endoscopy;  Laterality: N/A;    ESOPHAGOGASTRODUODENOSCOPY ENDOSCOPY     with esophagus being stretched twice per pt,    EYE SURGERY     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      Home Medications    Prior to Admission medications   Medication Sig Start Date End Date Taking? Authorizing Provider  promethazine-dextromethorphan (PROMETHAZINE-DM) 6.25-15 MG/5ML syrup Take 2.5 mLs by mouth 4 (four) times daily as needed for cough. 09/28/23  Yes Radford Pax, NP  VENTOLIN HFA 108 (90 Base) MCG/ACT inhaler INHALE 1 TO 2 PUFFS INTO THE LUNGS EVERY 6 HOURS AS NEEDED FOR WHEEZING OR SHORTNESS OF BREATH 09/25/23   Marcelyn Bruins, MD  acyclovir (ZOVIRAX) 400 MG tablet Take 1 tablet (400 mg total) by mouth 2 (two) times daily. 04/07/23   Meriam Sprague, MD  ADVAIR [facility] 115-21 MCG/ACT inhaler Inhale 2 puffs into the lungs 2 (two) times daily. 09/13/23   Birder Robson, MD  albuterol (PROVENTIL) (2.5 MG/3ML) 0.083% nebulizer solution Take 3 mLs (2.5 mg total) by nebulization every 4 (four) hours as needed for wheezing or shortness of breath. 07/31/23   Marcelyn Bruins, MD  amLODipine (NORVASC) 5 MG tablet Take 1 tablet (5 mg total) by mouth daily. 08/08/23   Tobb, Kardie, DO  Artificial Tear Ointment (DRY EYES OP) Place 1-2 drops into both eyes daily as needed (for dry eyes).     [provider]  ascorbic acid (VITAMIN C) 1000 MG tablet Take 1 tablet by mouth daily.    [provider]  azelastine (ASTELIN) 0.1 % nasal spray Place 1 spray into both nostrils 2 (two) times daily as needed. Use in each nostril as directed 09/13/23   Birder Robson, MD  azelastine (OPTIVAR) 0.05 % ophthalmic solution Place 1 drop into both eyes 2 (two) times daily. 02/24/23   Marcelyn Bruins, MD  Blood  Glucose Calibration (TRUE METRIX LEVEL 1) Low SOLN  09/20/21   [provider]  Blood Glucose Monitoring Suppl (TRUE METRIX METER) w/Device KIT  09/20/21   [provider]  cefdinir (OMNICEF) 300 MG capsule Take 1 capsule (300 mg total) by mouth 2 (two) times daily. 09/06/23   Wallis Bamberg, PA-C  cholecalciferol (VITAMIN D3) 25 MCG (1000 UNIT) tablet Take 2,000 Units by mouth daily.    [provider]  cycloSPORINE (RESTASIS) 0.05 % ophthalmic emulsion Place 1 drop into both eyes 2 times daily. 03/08/22   [provider]  empagliflozin (JARDIANCE) 10 MG TABS tablet Take 1 tablet (10 mg total) by mouth daily before breakfast. 08/21/23   [name], FNP  EPINEPHrine 0.3 mg/0.3  mL IJ SOAJ injection Inject 0.3 mg into the muscle as needed for anaphylaxis. 07/31/23   Marcelyn Bruins, MD  fluticasone (FLONASE) 50 MCG/ACT nasal spray Place 1 spray into both nostrils daily. 09/13/23   Birder Robson, MD  gabapentin (NEURONTIN) 300 MG capsule Take 300 mg by mouth 3 (three) times daily. Patient not taking: Reported on 08/21/2023    [provider]  hydrOXYzine (ATARAX) 25 MG tablet Take 1 tablet (25 mg total) by mouth every 6 (six) hours. 06/25/23   White, Elita Boone, NP  levocetirizine (XYZAL) 5 MG tablet Take 1 tablet (5 mg total) by mouth every evening. 09/13/23   Birder Robson, MD  metoprolol succinate (TOPROL-XL) 25 MG 24 hr tablet Take 1 tablet (25 mg total) by mouth daily. 04/07/23   Meriam Sprague, MD  montelukast (SINGULAIR) 10 MG tablet Take 1 tablet (10 mg total) by mouth daily. 09/13/23   Birder Robson, MD  Multiple Vitamin (MULTIVITAMIN WITH MINERALS) TABS tablet Take 1 tablet by mouth daily.    [provider]  ondansetron (ZOFRAN-ODT) 4 MG disintegrating tablet Take 1 tablet (4 mg total) by mouth every 8 (eight) hours as needed for nausea or vomiting. 05/19/23   Garrison, [place] N, FNP  pantoprazole (PROTONIX) 40 MG tablet Take 1  tablet (40 mg total) by mouth daily. 09/13/23   Birder Robson, MD  permethrin (ELIMITE) 5 % cream Apply to affected area once. Allow to sit on skin for 8-14 hours, then wash off. Patient not taking: Reported on 08/21/2023 07/22/23   Carlisle Beers, FNP  pravastatin (PRAVACHOL) 20 MG tablet Take 1 tablet (20 mg total) by mouth every evening. 08/21/23 08/20/24  [name], FNP  predniSONE (DELTASONE) 20 MG tablet Take 2 tablets daily with breakfast. Patient not taking: Reported on 09/13/2023 09/06/23   Wallis Bamberg, PA-C  Semaglutide,0.25 or 0.5MG /DOS, 2 MG/3ML SOPN Inject 2 mg into the skin once a week. Inject 0.25mg  for 4 weeks after increase to 0.5mg  for 4 weeks. 09/05/23   [name], FNP  simethicone (MYLICON) 40 MG/0.6ML drops Take 0.6 mLs (40 mg total) by mouth with breakfast, with lunch, and with evening meal. Patient not taking: Reported on 08/21/2023 05/19/23   Garrison, [place] N, FNP  sucralfate (CARAFATE) 1 g tablet Take 1 g by mouth 4 (four) times daily.  Patient not taking: Reported on 09/13/2023 02/11/20   [provider]  TRUE METRIX BLOOD GLUCOSE TEST test strip 1 each 3 (three) times daily. 09/20/21   [provider]  TRUEplus Lancets 33G MISC Apply 1 each topically 3 (three) times daily. 09/20/21   [provider]  esomeprazole (NEXIUM) 40 MG capsule Take 1 capsule (40 mg total) by mouth daily. Patient not taking: Reported on 07/08/2020 04/24/19 08/06/20  Arby Barrette, MD    Family History Family History  Problem Relation Age of Onset   Allergic rhinitis Mother    Diabetes Father        type 2   Heart disease Father    Hypertension Father    Allergic rhinitis Father    Allergic rhinitis Sister    Collagen disease Sister    Heart disease Brother    Hypertension Brother    Angioedema Neg Hx    Asthma Neg Hx    Atopy Neg Hx    Eczema Neg Hx    Immunodeficiency Neg Hx    Urticaria Neg Hx     Social History Social History  Tobacco Use    Smoking status: Former    Current packs/day: 0.00    Average packs/day: 0.5 packs/day for 30.0 years (15.0 ttl pk-yrs)    Types: Cigarettes    Start date: 12/08/1964    Quit date: 12/08/1994    Years since quitting: 28.8   Smokeless tobacco: Never  Vaping Use   Vaping status: Never Used  Substance Use Topics   Alcohol use: No   Drug use: No     Allergies   Crab [shellfish allergy], Erythromycin, Hm lidocaine patch [lidocaine], Erdafitinib, Fish allergy, Metoclopramide, Sulfacetamide, Sulfacetamide sodium-sulfur, Doxycycline, Levaquin [levofloxacin in d5w], Morphine, and Sulfa antibiotics   Review of Systems Review of Systems  HENT:  Positive for congestion, sneezing and sore throat.   Respiratory:  Positive for cough.      Physical Exam Triage Vital Signs ED Triage Vitals  Encounter Vitals Group     BP 09/28/23 0851 110/72     Systolic BP Percentile --      Diastolic BP Percentile --      Pulse Rate 09/28/23 0851 (!) 101     Resp 09/28/23 0851 17     Temp 09/28/23 0851 98 F (36.7 C)     Temp Source 09/28/23 0851 Oral     SpO2 09/28/23 0851 94 %     Weight --      Height --      Head Circumference --      Peak Flow --      Pain Score 09/28/23 0850 4     Pain Loc --      Pain Education --      Exclude from Growth Chart --    No data found.  Updated Vital Signs BP 110/72 (BP Location: Right Arm)   Pulse (!) 101   Temp 98 F (36.7 C) (Oral)   Resp 17   SpO2 94%   Visual Acuity Right Eye Distance:   Left Eye Distance:   Bilateral Distance:    Right Eye Near:   Left Eye Near:    Bilateral Near:     Physical Exam Vitals and nursing note reviewed.  Constitutional:      General: She is not in acute distress.    Appearance: She is well-developed. She is not ill-appearing.  HENT:     Head: Normocephalic and atraumatic.     Right Ear: Tympanic membrane and ear canal normal.     Left Ear: Tympanic membrane and ear canal normal.     Nose: Congestion  present.     Mouth/Throat:     Mouth: Mucous membranes are moist.     Pharynx: Oropharynx is clear. Uvula midline. Posterior oropharyngeal erythema present.     Tonsils: No tonsillar exudate or tonsillar abscesses.  Eyes:     Conjunctiva/sclera: Conjunctivae normal.     Pupils: Pupils are equal, round, and reactive to light.  Cardiovascular:     Rate and Rhythm: Normal rate and regular rhythm.     Heart sounds: Normal heart sounds.  Pulmonary:     Effort: Pulmonary effort is normal.     Breath sounds: Normal breath sounds.  Musculoskeletal:     Cervical back: Normal range of motion and neck supple.  Lymphadenopathy:     Cervical: No cervical adenopathy.  Skin:    General: Skin is warm and dry.     Findings: Rash present. Rash is macular and papular. Rash is not crusting, nodular, purpuric, pustular, scaling, urticarial or vesicular.  Comments: There is a small area no greater than a quarter size mildly erythematous macular papular rash to the dorsum of the left wrist.  No swelling or drainage.  There are no excoriations  Neurological:     General: No focal deficit present.     Mental Status: She is alert and oriented to person, place, and time.  Psychiatric:        Mood and Affect: Mood normal.        Behavior: Behavior normal.      UC Treatments / Results  Labs (all labs ordered are listed, but only abnormal results are displayed) Labs Reviewed  SARS CORONAVIRUS 2 (TAT 6-24 HRS)    EKG   Radiology No results found.  Procedures Procedures (including critical care time)  Medications Ordered in UC Medications - No data to display  Initial Impression / Assessment and Plan / UC Course  I have reviewed the triage vital signs and the nursing notes.  Pertinent labs & imaging results that were available during my care of the patient were reviewed by me and considered in my medical decision making (see chart for details).     Reviewed exam and symptoms with  patient.  No red flags.  COVID PCR and will contact if positive.  Discussed rash is not consistent with scabies and is likely contact dermatitis.  Advised OTC hydrocortisone cream to area as needed.  Patient states due to her dysphagia she is unable to take pills, will do Promethazine DM as needed for cough.  Side effect profile reviewed and patient states she has had this in the past without issue and tolerates well. Final Clinical Impressions(s) / UC Diagnoses   Final diagnoses:  Acute cough  Viral upper respiratory illness  Dermatitis     Discharge Instructions      The clinic will contact you with results of the COVID test done today if positive.  You may take Promethazine DM as needed for cough.  Please of this medication can make you drowsy.  Do not drink alcohol or drive on this medication.  Continue your inhalers.  Lots of rest and fluids.  You may use over-the-counter hydrocortisone cream to the rash on your wrist as needed.  Please follow-up with your PCP in 1 to 2 days for recheck.  Please go to the ER if you develop any worsening symptoms.  I hope you feel better soon!    ED Prescriptions     Medication Sig Dispense Auth. Provider   promethazine-dextromethorphan (PROMETHAZINE-DM) 6.25-15 MG/5ML syrup Take 2.5 mLs by mouth 4 (four) times daily as needed for cough. 118 mL Radford Pax, NP      PDMP not reviewed this encounter.   Radford Pax, NP 09/28/23 [phone]

## 2023-09-28 NOTE — Discharge Instructions (Signed)
The clinic will contact you with results of the COVID test done today if positive.  You may take Promethazine DM as needed for cough.  Please of this medication can make you drowsy.  Do not drink alcohol or drive on this medication.  Continue your inhalers.  Lots of rest and fluids.  You may use over-the-counter hydrocortisone cream to the rash on your wrist as needed.  Please follow-up with your PCP in 1 to 2 days for recheck.  Please go to the ER if you develop any worsening symptoms.  I hope you feel better soon!

## 2023-09-29 LAB — SARS CORONAVIRUS 2 (TAT 6-24 HRS): SARS Coronavirus 2: NEGATIVE

## 2023-10-03 ENCOUNTER — Other Ambulatory Visit: Payer: Self-pay | Admitting: Cardiology

## 2023-10-03 DIAGNOSIS — I10 Essential (primary) hypertension: Secondary | ICD-10-CM

## 2023-10-03 DIAGNOSIS — R0609 Other forms of dyspnea: Secondary | ICD-10-CM

## 2023-10-03 DIAGNOSIS — E119 Type 2 diabetes mellitus without complications: Secondary | ICD-10-CM

## 2023-10-03 DIAGNOSIS — J45909 Unspecified asthma, uncomplicated: Secondary | ICD-10-CM

## 2023-10-03 MED ORDER — METOPROLOL SUCCINATE ER 25 MG PO TB24: 25.0000 mg | ORAL_TABLET | Freq: Every day | ORAL | 0 refills | Status: DC

## 2023-10-09 ENCOUNTER — Other Ambulatory Visit: Payer: Self-pay

## 2023-10-09 ENCOUNTER — Ambulatory Visit: Payer: 59 | Admitting: Cardiology

## 2023-10-10 ENCOUNTER — Ambulatory Visit: Payer: 59 | Attending: Cardiology | Admitting: Cardiology

## 2023-10-10 ENCOUNTER — Encounter: Payer: Self-pay | Admitting: Cardiology

## 2023-10-10 DIAGNOSIS — J45909 Unspecified asthma, uncomplicated: Secondary | ICD-10-CM | POA: Diagnosis not present

## 2023-10-10 DIAGNOSIS — E119 Type 2 diabetes mellitus without complications: Secondary | ICD-10-CM | POA: Diagnosis not present

## 2023-10-10 DIAGNOSIS — I10 Essential (primary) hypertension: Secondary | ICD-10-CM | POA: Diagnosis not present

## 2023-10-10 DIAGNOSIS — R0609 Other forms of dyspnea: Secondary | ICD-10-CM

## 2023-10-10 DIAGNOSIS — E1140 Type 2 diabetes mellitus with diabetic neuropathy, unspecified: Secondary | ICD-10-CM

## 2023-10-10 MED ORDER — PRAVASTATIN SODIUM 20 MG PO TABS
20.0000 mg | ORAL_TABLET | Freq: Every evening | ORAL | 3 refills | Status: DC
  Filled 2024-07-23: qty 90, 90d supply, fill #0

## 2023-10-10 MED ORDER — AMLODIPINE BESYLATE 5 MG PO TABS
5.0000 mg | ORAL_TABLET | Freq: Every day | ORAL | 3 refills | Status: DC
  Filled 2024-07-23: qty 90, 90d supply, fill #0

## 2023-10-10 MED ORDER — METOPROLOL SUCCINATE ER 25 MG PO TB24
25.0000 mg | ORAL_TABLET | Freq: Every day | ORAL | 3 refills | Status: DC
  Filled 2024-07-23: qty 90, 90d supply, fill #0

## 2023-10-10 NOTE — Patient Instructions (Signed)
Medication Instructions:  Your physician recommends that you continue on your current medications as directed. Please refer to the Current Medication list given to you today.  *If you need a refill on your cardiac medications before your next appointment, please call your pharmacy*   Lab Work: None   Testing/Procedures: None   Follow-Up: At [facility], you and your health needs are our priority.  As part of our continuing mission to provide you with exceptional heart care, we have created designated Provider Care Teams.  These Care Teams include your primary Cardiologist (physician) and Advanced Practice Providers (APPs -  Physician Assistants and Nurse Practitioners) who all work together to provide you with the care you need, when you need it.   Your next appointment:   9 month(s)  Provider:   Kardie Tobb, DO   

## 2023-10-10 NOTE — Progress Notes (Signed)
Cardiology Office Note:    Date:  10/14/2023   ID:  Pamela Roberson, DOB 1956-09-21, MRN [id number]  PCP:  [name], FNP  Cardiologist:  Thomasene Ripple, DO  Electrophysiologist:  None   Referring MD: [name], FNP   " I am ok - just establishing care"   History of Present Illness:    Pamela Roberson is a 67 y.o. female with a hx of COPD, DMII, HTN, HLD, OSA and asthma who presents to clinic for follow-up.   She offered no complaints at this time.   Past Medical History:  Diagnosis Date   Arthritis    Asthma    Carotid stenosis, bilateral    Carpal tunnel syndrome    Chronic low back pain    COPD (chronic obstructive pulmonary disease) (HCC)    Diabetes mellitus without complication (HCC)    type II    Diabetic neuropathy (HCC)    Diverticulitis    Dyspnea    mild    Frequent falls    GERD (gastroesophageal reflux disease)    History of bronchitis    Hypercholesterolemia    Hypertension    IBS (irritable bowel syndrome)    Osteoarthritis    knee   Palpitations    Right ventricular enlargement    per pt report   Sleep apnea    mild but no sleep apnea    TMJ (dislocation of temporomandibular joint)    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    Vitamin D deficiency     Past Surgical History:  Procedure Laterality Date   ABDOMINAL HYSTERECTOMY  1993   CARPAL TUNNEL RELEASE Right    CERVICAL DISC SURGERY  04/29/2015   [place]   ESOPHAGEAL MANOMETRY N/A 03/03/2021   Procedure: ESOPHAGEAL MANOMETRY (EM);  Surgeon: Charlott Rakes, MD;  Location: [facility];  Service: Endoscopy;  Laterality: N/A;   ESOPHAGOGASTRODUODENOSCOPY ENDOSCOPY     with esophagus being stretched twice per pt,    EYE SURGERY     fatty 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      feeding tube placement and removal   2015   NASAL SEPTUM SURGERY     ROTATOR CUFF REPAIR Right    spurs removed from esophagus   2015   TONSILLECTOMY      Current  Medications: Current Meds  Medication Sig   acyclovir (ZOVIRAX) 400 MG tablet Take 1 tablet (400 mg total) by mouth 2 (two) times daily.   ADVAIR HFA 115-21 MCG/ACT inhaler Inhale 2 puffs into the lungs 2 (two) times daily.   albuterol (PROVENTIL) (2.5 MG/3ML) 0.083% nebulizer solution Take 3 mLs (2.5 mg total) by nebulization every 4 (four) hours as needed for wheezing or shortness of breath.   Artificial Tear Ointment (DRY EYES OP) Place 1-2 drops into both eyes daily as needed (for dry eyes).    ascorbic acid (VITAMIN C) 1000 MG tablet Take 1 tablet by mouth daily.   azelastine (ASTELIN) 0.1 % nasal spray Place 1 spray into both nostrils 2 (two) times daily as needed. Use in each nostril as directed   azelastine (OPTIVAR) 0.05 % ophthalmic solution Place 1 drop into both eyes 2 (two) times daily.   Blood Glucose Calibration (TRUE METRIX LEVEL 1) Low SOLN    Blood Glucose Monitoring Suppl (TRUE METRIX METER) w/Device KIT    cholecalciferol (VITAMIN D3) 25 MCG (1000 UNIT) tablet Take 2,000 Units by mouth daily.   cycloSPORINE (RESTASIS) 0.05 %  ophthalmic emulsion Place 1 drop into both eyes 2 times daily.   EPINEPHrine 0.3 mg/0.3 mL IJ SOAJ injection Inject 0.3 mg into the muscle as needed for anaphylaxis.   fluticasone (FLONASE) 50 MCG/ACT nasal spray Place 1 spray into both nostrils daily.   levocetirizine (XYZAL) 5 MG tablet Take 1 tablet (5 mg total) by mouth every evening.   montelukast (SINGULAIR) 10 MG tablet Take 1 tablet (10 mg total) by mouth daily.   Multiple Vitamin (MULTIVITAMIN WITH MINERALS) TABS tablet Take 1 tablet by mouth daily.   ondansetron (ZOFRAN-ODT) 4 MG disintegrating tablet Take 1 tablet (4 mg total) by mouth every 8 (eight) hours as needed for nausea or vomiting.   pantoprazole (PROTONIX) 40 MG tablet Take 1 tablet (40 mg total) by mouth daily.   promethazine-dextromethorphan (PROMETHAZINE-DM) 6.25-15 MG/5ML syrup Take 2.5 mLs by mouth 4 (four) times daily as needed  for cough.   TRUE METRIX BLOOD GLUCOSE TEST test strip 1 each 3 (three) times daily.   TRUEplus Lancets 33G MISC Apply 1 each topically 3 (three) times daily.   VENTOLIN HFA 108 (90 Base) MCG/ACT inhaler INHALE 1 TO 2 PUFFS INTO THE LUNGS EVERY 6 HOURS AS NEEDED FOR WHEEZING OR SHORTNESS OF BREATH   [DISCONTINUED] amLODipine (NORVASC) 5 MG tablet TAKE 1 TABLET(5 MG) BY MOUTH DAILY   [DISCONTINUED] metoprolol succinate (TOPROL-XL) 25 MG 24 hr tablet Take 1 tablet (25 mg total) by mouth daily.   [DISCONTINUED] pravastatin (PRAVACHOL) 20 MG tablet Take 1 tablet (20 mg total) by mouth every evening.     Allergies:   Crab [shellfish allergy], Erythromycin, Hm lidocaine patch [lidocaine], Erdafitinib, Fish allergy, Metoclopramide, Sulfacetamide, Sulfacetamide sodium-sulfur, Doxycycline, Levaquin [levofloxacin in d5w], Morphine, and Sulfa antibiotics   Social History   Socioeconomic History   Marital status: Widowed    Spouse name: Not on file   Number of children: 3   Years of education: Not on file   Highest education level: 11th grade  Occupational History    Comment: NA  Tobacco Use   Smoking status: Former    Current packs/day: 0.00    Average packs/day: 0.5 packs/day for 30.0 years (15.0 ttl pk-yrs)    Types: Cigarettes    Start date: 12/08/1964    Quit date: 12/08/1994    Years since quitting: 28.8   Smokeless tobacco: Never  Vaping Use   Vaping status: Never Used  Substance and Sexual Activity   Alcohol use: No   Drug use: No   Sexual activity: Not Currently  Other Topics Concern   Not on file  Social History Narrative   Lives with daughter   Caffeine- coffee 12 oz   Social Determinants of Health   Financial Resource Strain: Low Risk  (10/13/2023)   Overall Financial Resource Strain (CARDIA)    Difficulty of Paying Living Expenses: Not very hard  Food Insecurity: Food Insecurity Present (10/13/2023)   Hunger Vital Sign    Worried About Running Out of Food in the Last Year:  Sometimes true    Ran Out of Food in the Last Year: Sometimes true  Transportation Needs: No Transportation Needs (10/13/2023)   PRAPARE - Administrator, Civil Service (Medical): No    Lack of Transportation (Non-Medical): No  Physical Activity: Sufficiently Active (10/13/2023)   Exercise Vital Sign    Days of Exercise per Week: 5 days    Minutes of Exercise per Session: 40 min  Stress: No Stress Concern Present (10/13/2023)   [place]  Institute of Occupational Health - Occupational Stress Questionnaire    Feeling of Stress : Not at all  Social Connections: Moderately Integrated (10/13/2023)   Social Connection and Isolation Panel [NHANES]    Frequency of Communication with Friends and Family: Twice a week    Frequency of Social Gatherings with Friends and Family: More than three times a week    Attends Religious Services: More than 4 times per year    Active Member of [employer] or Organizations: Yes    Attends Banker Meetings: More than 4 times per year    Marital Status: Widowed     Family History: The patient's family history includes Allergic rhinitis in her father, mother, and sister; Collagen disease in her sister; Diabetes in her father; Heart disease in her brother and father; Hypertension in her brother and father. There is no history of Angioedema, Asthma, Atopy, Eczema, Immunodeficiency, or Urticaria.  ROS:   Review of Systems  Constitution: Negative for decreased appetite, fever and weight gain.  HENT: Negative for congestion, ear discharge, hoarse voice and sore throat.   Eyes: Negative for discharge, redness, vision loss in right eye and visual halos.  Cardiovascular: Negative for chest pain, dyspnea on exertion, leg swelling, orthopnea and palpitations.  Respiratory: Negative for cough, hemoptysis, shortness of breath and snoring.   Endocrine: Negative for heat intolerance and polyphagia.  Hematologic/Lymphatic: Negative for bleeding problem. Does not  bruise/bleed easily.  Skin: Negative for flushing, nail changes, rash and suspicious lesions.  Musculoskeletal: Negative for arthritis, joint pain, muscle cramps, myalgias, neck pain and stiffness.  Gastrointestinal: Negative for abdominal pain, bowel incontinence, diarrhea and excessive appetite.  Genitourinary: Negative for decreased libido, genital sores and incomplete emptying.  Neurological: Negative for brief paralysis, focal weakness, headaches and loss of balance.  Psychiatric/Behavioral: Negative for altered mental status, depression and suicidal ideas.  Allergic/Immunologic: Negative for HIV exposure and persistent infections.    EKGs/Labs/Other Studies Reviewed:    The following studies were reviewed today:   EKG:  None today   Recent Labs: 08/21/2023: ALT 15; BUN 9; Creatinine, Ser 0.83; Hemoglobin 14.6; Platelets 290; Potassium 5.0; Sodium 146  Recent Lipid Panel    Component Value Date/Time   CHOL 114 08/21/2023 1156   TRIG 124 08/21/2023 1156   HDL 56 08/21/2023 1156   CHOLHDL 2.0 08/21/2023 1156   LDLCALC 36 08/21/2023 1156    Physical Exam:    VS:  BP 112/70 (BP Location: Left Arm, Patient Position: Sitting, Cuff Size: Normal)   Pulse 93   Ht 5\' 6"  (1.676 m)   Wt 195 lb 12.8 oz (88.8 kg)   SpO2 97%   BMI 31.60 kg/m     Wt Readings from Last 3 Encounters:  10/10/23 195 lb 12.8 oz (88.8 kg)  09/13/23 195 lb 12.8 oz (88.8 kg)  08/21/23 192 lb 12.8 oz (87.5 kg)     GEN: Well nourished, well developed in no acute distress HEENT: Normal NECK: No JVD; No carotid bruits LYMPHATICS: No lymphadenopathy CARDIAC: S1S2 noted,RRR, no murmurs, rubs, gallops RESPIRATORY:  Clear to auscultation without rales, wheezing or rhonchi  ABDOMEN: Soft, non-tender, non-distended, +bowel sounds, no guarding. EXTREMITIES: No edema, No cyanosis, no clubbing MUSCULOSKELETAL:  No deformity  SKIN: Warm and dry NEUROLOGIC:  Alert and oriented x 3, non-focal PSYCHIATRIC:  Normal  affect, good insight  ASSESSMENT:    1. Dyspnea on exertion   2. Primary hypertension   3. Diabetes mellitus with coincident hypertension (HCC)   4.  Uncomplicated asthma, unspecified asthma severity, unspecified whether persistent   5. Type 2 diabetes mellitus with diabetic neuropathy, without long-term current use of insulin (HCC)    PLAN:     She is doing well from a CV standpoint. No medication changes today    The patient is in agreement with the above plan. The patient left the office in stable condition.  The patient will follow up in   Medication Adjustments/Labs and Tests Ordered: Current medicines are reviewed at length with the patient today.  Concerns regarding medicines are outlined above.  No orders of the defined types were placed in this encounter.  Meds ordered this encounter  Medications   amLODipine (NORVASC) 5 MG tablet    Sig: Take 1 tablet (5 mg total) by mouth daily.    Dispense:  90 tablet    Refill:  3   metoprolol succinate (TOPROL-XL) 25 MG 24 hr tablet    Sig: Take 1 tablet (25 mg total) by mouth daily.    Dispense:  90 tablet    Refill:  3   pravastatin (PRAVACHOL) 20 MG tablet    Sig: Take 1 tablet (20 mg total) by mouth every evening.    Dispense:  90 tablet    Refill:  3    Patient Instructions  Medication Instructions:  Your physician recommends that you continue on your current medications as directed. Please refer to the Current Medication list given to you today.  *If you need a refill on your cardiac medications before your next appointment, please call your pharmacy*   Lab Work: None   Testing/Procedures: None   Follow-Up: At [facility], you and your health needs are our priority.  As part of our continuing mission to provide you with exceptional heart care, we have created designated Provider Care Teams.  These Care Teams include your primary Cardiologist (physician) and Advanced Practice Providers (APPs -   Physician Assistants and Nurse Practitioners) who all work together to provide you with the care you need, when you need it.  Your next appointment:   9 month(s)  Provider:   Thomasene Ripple, DO     Adopting a Healthy Lifestyle.  Know what a healthy weight is for you (roughly BMI <25) and aim to maintain this   Aim for 7+ servings of fruits and vegetables daily   65-80+ fluid ounces of water or unsweet tea for healthy kidneys   Limit to max 1 drink of alcohol per day; avoid smoking/tobacco   Limit animal fats in diet for cholesterol and heart health - choose grass fed whenever available   Avoid highly processed foods, and foods high in saturated/trans fats   Aim for low stress - take time to unwind and care for your mental health   Aim for 150 min of moderate intensity exercise weekly for heart health, and weights twice weekly for bone health   Aim for 7-9 hours of sleep daily   When it comes to diets, agreement about the perfect plan isnt easy to find, even among the experts. Experts at the [facility] [employer] developed an idea known as the Healthy Eating Plate. Just imagine a plate divided into logical, healthy portions.   The emphasis is on diet quality:   Load up on vegetables and fruits - one-half of your plate: Aim for color and variety, and remember that potatoes dont count.   Go for whole grains - one-quarter of your plate: Whole wheat, barley, wheat berries, quinoa,  oats, brown rice, and foods made with them. If you want pasta, go with whole wheat pasta.   Protein power - one-quarter of your plate: Fish, chicken, beans, and nuts are all healthy, versatile protein sources. Limit red meat.   The diet, however, does go beyond the plate, offering a few other suggestions.   Use healthy plant oils, such as olive, canola, soy, corn, sunflower and peanut. Check the labels, and avoid partially hydrogenated oil, which have unhealthy trans fats.   If youre thirsty,  drink water. Coffee and tea are good in moderation, but skip sugary drinks and limit milk and dairy products to one or two daily servings.   The type of carbohydrate in the diet is more important than the amount. Some sources of carbohydrates, such as vegetables, fruits, whole grains, and beans-are healthier than others.   Finally, stay active  Signed, Thomasene Ripple, DO  10/14/2023 12:37 PM    [facility]

## 2023-10-12 DIAGNOSIS — R350 Frequency of micturition: Secondary | ICD-10-CM | POA: Diagnosis not present

## 2023-10-17 ENCOUNTER — Encounter: Payer: Self-pay | Admitting: Nurse Practitioner

## 2023-10-17 ENCOUNTER — Ambulatory Visit (INDEPENDENT_AMBULATORY_CARE_PROVIDER_SITE_OTHER): Payer: 59 | Admitting: Nurse Practitioner

## 2023-10-17 VITALS — BP 120/64 | HR 88 | Temp 98.4°F | Ht 66.0 in | Wt 187.6 lb

## 2023-10-17 DIAGNOSIS — E1140 Type 2 diabetes mellitus with diabetic neuropathy, unspecified: Secondary | ICD-10-CM

## 2023-10-17 DIAGNOSIS — Z2821 Immunization not carried out because of patient refusal: Secondary | ICD-10-CM

## 2023-10-17 DIAGNOSIS — E66811 Obesity, class 1: Secondary | ICD-10-CM | POA: Diagnosis not present

## 2023-10-17 DIAGNOSIS — Z6830 Body mass index (BMI) 30.0-30.9, adult: Secondary | ICD-10-CM

## 2023-10-17 DIAGNOSIS — Z23 Encounter for immunization: Secondary | ICD-10-CM

## 2023-10-17 NOTE — Progress Notes (Signed)
Pamela Roberson, CMA,acting as a Neurosurgeon for Pamela Felts, FNP.,have documented all relevant documentation on the behalf of Pamela Felts, FNP,as directed by  Pamela Felts, FNP while in the presence of Pamela Felts, FNP.  Subjective:  Patient ID: Pamela Roberson , female    DOB: February 23, 1956 , 67 y.o.   MRN: [id number]  Chief Complaint  Patient presents with   Diabetes    HPI  Patient presents today for a ozempic follow up, Patient reports compliance with medication. Patient denies any chest pain, SOB, or headaches. Patient reports her ozempic made her whole body break out and felt like it was on fire. Patient reports she took 3 doses of ozempic but has stopped since the break out. She reports having hives. She was burning bad all over her body. This was also during the time of having cold symptoms. She had a similar rash previously when her son had scabies. Per the note the rash was not consistent with scabies. She has been seeing an asthma specialist. She is on Xyzal from her allergist. She has multiple allergies. She does not want to try any other medications at this time due to not tolerating the medications.      Past Medical History:  Diagnosis Date   Arthritis    Asthma    Carotid stenosis, bilateral    Carpal tunnel syndrome    Chronic low back pain    COPD (chronic obstructive pulmonary disease) (HCC)    Diabetes mellitus without complication (HCC)    type II    Diabetic neuropathy (HCC)    Diverticulitis    Dyspnea    mild    Frequent falls    GERD (gastroesophageal reflux disease)    History of bronchitis    Hypercholesterolemia    Hypertension    IBS (irritable bowel syndrome)    Osteoarthritis    knee   Palpitations    Right ventricular enlargement    per pt report   Sleep apnea    mild but no sleep apnea    TMJ (dislocation of temporomandibular joint)    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    Vitamin D deficiency      Family History  Problem  Relation Age of Onset   Allergic rhinitis Mother    Diabetes Father        type 2   Heart disease Father    Hypertension Father    Allergic rhinitis Father    Allergic rhinitis Sister    Collagen disease Sister    Heart disease Brother    Hypertension Brother    Angioedema Neg Hx    Asthma Neg Hx    Atopy Neg Hx    Eczema Neg Hx    Immunodeficiency Neg Hx    Urticaria Neg Hx      Current Outpatient Medications:    acyclovir (ZOVIRAX) 400 MG tablet, Take 1 tablet (400 mg total) by mouth 2 (two) times daily., Disp: 30 tablet, Rfl: 4   ADVAIR HFA 115-21 MCG/ACT inhaler, Inhale 2 puffs into the lungs 2 (two) times daily., Disp: 12 g, Rfl: 5   albuterol (PROVENTIL) (2.5 MG/3ML) 0.083% nebulizer solution, Take 3 mLs (2.5 mg total) by nebulization every 4 (four) hours as needed for wheezing or shortness of breath., Disp: 75 mL, Rfl: 1   amLODipine (NORVASC) 5 MG tablet, Take 1 tablet (5 mg total) by mouth daily., Disp: 90 tablet, Rfl: 3   Artificial Tear Ointment (DRY  EYES OP), Place 1-2 drops into both eyes daily as needed (for dry eyes). , Disp: , Rfl:    ascorbic acid (VITAMIN C) 1000 MG tablet, Take 1 tablet by mouth daily., Disp: , Rfl:    azelastine (ASTELIN) 0.1 % nasal spray, Place 1 spray into both nostrils 2 (two) times daily as needed. Use in each nostril as directed, Disp: 30 mL, Rfl: 5   azelastine (OPTIVAR) 0.05 % ophthalmic solution, Place 1 drop into both eyes 2 (two) times daily., Disp: 6 mL, Rfl: 5   Blood Glucose Calibration (TRUE METRIX LEVEL 1) Low SOLN, , Disp: , Rfl:    Blood Glucose Monitoring Suppl (TRUE METRIX METER) w/Device KIT, , Disp: , Rfl:    cholecalciferol (VITAMIN D3) 25 MCG (1000 UNIT) tablet, Take 2,000 Units by mouth daily., Disp: , Rfl:    cycloSPORINE (RESTASIS) 0.05 % ophthalmic emulsion, Place 1 drop into both eyes 2 times daily., Disp: , Rfl:    EPINEPHrine 0.3 mg/0.3 mL IJ SOAJ injection, Inject 0.3 mg into the muscle as needed for anaphylaxis.,  Disp: 1 each, Rfl: 1   fluticasone (FLONASE) 50 MCG/ACT nasal spray, Place 1 spray into both nostrils daily., Disp: 16 g, Rfl: 5   levocetirizine (XYZAL) 5 MG tablet, Take 1 tablet (5 mg total) by mouth every evening., Disp: 30 tablet, Rfl: 5   metoprolol succinate (TOPROL-XL) 25 MG 24 hr tablet, Take 1 tablet (25 mg total) by mouth daily., Disp: 90 tablet, Rfl: 3   montelukast (SINGULAIR) 10 MG tablet, Take 1 tablet (10 mg total) by mouth daily., Disp: 30 tablet, Rfl: 5   Multiple Vitamin (MULTIVITAMIN WITH MINERALS) TABS tablet, Take 1 tablet by mouth daily., Disp: , Rfl:    ondansetron (ZOFRAN-ODT) 4 MG disintegrating tablet, Take 1 tablet (4 mg total) by mouth every 8 (eight) hours as needed for nausea or vomiting., Disp: 20 tablet, Rfl: 0   pantoprazole (PROTONIX) 40 MG tablet, Take 1 tablet (40 mg total) by mouth daily., Disp: 30 tablet, Rfl: 5   pravastatin (PRAVACHOL) 20 MG tablet, Take 1 tablet (20 mg total) by mouth every evening., Disp: 90 tablet, Rfl: 3   promethazine-dextromethorphan (PROMETHAZINE-DM) 6.25-15 MG/5ML syrup, Take 2.5 mLs by mouth 4 (four) times daily as needed for cough., Disp: 118 mL, Rfl: 0   TRUE METRIX BLOOD GLUCOSE TEST test strip, 1 each 3 (three) times daily., Disp: , Rfl:    TRUEplus Lancets 33G MISC, Apply 1 each topically 3 (three) times daily., Disp: , Rfl:    VENTOLIN HFA 108 (90 Base) MCG/ACT inhaler, INHALE 1 TO 2 PUFFS INTO THE LUNGS EVERY 6 HOURS AS NEEDED FOR WHEEZING OR SHORTNESS OF BREATH, Disp: 18 g, Rfl: 1   budesonide-formoterol (SYMBICORT) 80-4.5 MCG/ACT inhaler, Inhale 2 puffs into the lungs in the morning and at bedtime., Disp: 1 each, Rfl: 5   fluticasone-salmeterol (ADVAIR HFA) 115-21 MCG/ACT inhaler, Inhale 2 puffs into the lungs 2 (two) times daily., Disp: 1 each, Rfl: 5   Allergies  Allergen Reactions   Crab [Shellfish Allergy] Shortness Of Breath and Swelling   Erythromycin Swelling and Rash   Hm Lidocaine Patch [Lidocaine]     Burning  on skin   Erdafitinib     Other reaction(s): pt not sure   Fish Allergy     Swelling    Metoclopramide Other (See Comments)    Slurred speech and unable to move muscles,  Caused her to drag her feet   Sulfacetamide  Other reaction(s): itching, rash   Sulfacetamide Sodium-Sulfur Other (See Comments)    Other reaction(s): itching, rash   Doxycycline     GI Intolerance   Levaquin [Levofloxacin In D5w] Other (See Comments)    Causes irregular heart beat   Morphine Nausea And Vomiting and Rash    Irregular heart beat   Sulfa Antibiotics Rash     Review of Systems  Constitutional: Negative.   HENT: Negative.    Eyes: Negative.   Respiratory: Negative.    Cardiovascular: Negative.   Gastrointestinal: Negative.   Endocrine: Negative.   Genitourinary: Negative.   Musculoskeletal: Negative.   Skin: Negative.        Itching all over  Allergic/Immunologic: Negative.   Neurological: Negative.   Hematological: Negative.   Psychiatric/Behavioral: Negative.       Today's Vitals   10/17/23 0900  BP: 120/64  Pulse: 88  Temp: 98.4 F (36.9 C)  TempSrc: Oral  Weight: 187 lb 9.6 oz (85.1 kg)  Height: 5\' 6"  (1.676 m)  PainSc: 7   PainLoc: Knee   Body mass index is 30.28 kg/m.  Wt Readings from Last 3 Encounters:  10/17/23 187 lb 9.6 oz (85.1 kg)  10/10/23 195 lb 12.8 oz (88.8 kg)  09/13/23 195 lb 12.8 oz (88.8 kg)      Objective:  Physical Exam Vitals reviewed.  Constitutional:      General: She is not in acute distress.    Appearance: Normal appearance.  Cardiovascular:     Rate and Rhythm: Normal rate and regular rhythm.     Pulses: Normal pulses.     Heart sounds: Normal heart sounds. No murmur heard. Pulmonary:     Effort: Pulmonary effort is normal. No respiratory distress.     Breath sounds: Normal breath sounds. No wheezing.  Skin:    General: Skin is warm.     Capillary Refill: Capillary refill takes less than 2 seconds.  Neurological:     General: No  focal deficit present.     Mental Status: She is alert and oriented to person, place, and time.     Cranial Nerves: No cranial nerve deficit.     Motor: No weakness.  Psychiatric:        Mood and Affect: Mood normal.        Behavior: Behavior normal.        Thought Content: Thought content normal.        Judgment: Judgment normal.         Assessment And Plan:  Type 2 diabetes mellitus with diabetic neuropathy, without long-term current use of insulin (HCC) Assessment & Plan: Hgba1 stable, will recheck HgbA1c and encouraged to take medication as directed.   Orders: -     Ambulatory referral to Endocrinology  COVID-19 vaccination declined Assessment & Plan: Declines covid 19 vaccine. Discussed risk of covid 76 and if she changes her mind about the vaccine to call the office. Education has been provided regarding the importance of this vaccine but patient still declined. Advised may receive this vaccine at local pharmacy or Health Dept.or vaccine clinic. Aware to provide a copy of the vaccination record if obtained from local pharmacy or Health Dept.  Encouraged to take multivitamin, vitamin d, vitamin c and zinc to increase immune system. Aware can call office if would like to have vaccine here at office. Verbalized acceptance and understanding.    Need for influenza vaccination Assessment & Plan: Influenza vaccine administered Encouraged to take Tylenol as needed  for fever or muscle aches.   Orders: -     Flu Vaccine Trivalent High Dose (Fluad)  Class 1 obesity without serious comorbidity with body mass index (BMI) of 30.0 to 30.9 in adult, unspecified obesity type Assessment & Plan: She is encouraged to strive for BMI less than 30 to decrease cardiac risk. Advised to aim for at least 150 minutes of exercise per week.      Return for change December appt to January for DM f/u.  Patient was given opportunity to ask questions. Patient verbalized understanding of the plan and  was able to repeat key elements of the plan. All questions were answered to their satisfaction.    Jeanell Sparrow, FNP, have reviewed all documentation for this visit. The documentation on 10/17/23 for the exam, diagnosis, procedures, and orders are all accurate and complete.   IF YOU HAVE BEEN REFERRED TO A SPECIALIST, IT MAY TAKE 1-2 WEEKS TO SCHEDULE/PROCESS THE REFERRAL. IF YOU HAVE NOT HEARD FROM US/SPECIALIST IN TWO WEEKS, PLEASE GIVE [place] A CALL AT [phone].

## 2023-10-17 NOTE — Patient Instructions (Addendum)
Berberine supplement - this can help with lowering your blood sugar. You can get this over the counter. If you can not afford this you can get a supplement that says glucose control.

## 2023-10-19 DIAGNOSIS — M5416 Radiculopathy, lumbar region: Secondary | ICD-10-CM | POA: Diagnosis not present

## 2023-10-20 ENCOUNTER — Telehealth: Payer: Self-pay | Admitting: Internal Medicine

## 2023-10-20 LAB — HM DIABETES EYE EXAM

## 2023-10-20 NOTE — Telephone Encounter (Signed)
Please advise in regards to change in inhaler for the new year. Thank You.

## 2023-10-20 NOTE — Telephone Encounter (Signed)
[employer] states Advair will not be a part of pt's formulary in 2025 alternatives are a (generic) Advair, Wixela and Symbicort.

## 2023-10-20 NOTE — Telephone Encounter (Signed)
If there are questions please call Jared at [phone] ext [phone]

## 2023-10-23 ENCOUNTER — Other Ambulatory Visit: Payer: Self-pay | Admitting: Internal Medicine

## 2023-10-23 MED ORDER — BUDESONIDE-FORMOTEROL FUMARATE 80-4.5 MCG/ACT IN AERO: 2.0000 | INHALATION_SPRAY | Freq: Two times a day (BID) | RESPIRATORY_TRACT | 5 refills | Status: DC

## 2023-10-23 NOTE — Progress Notes (Signed)
Advair HFA no longer covered in 2025. Will switch to Symbicort.

## 2023-10-23 NOTE — Telephone Encounter (Signed)
Called and spoke with the patient and advised of change in inhaler due to insurance coverage. She states that she only saw Dr. Allena Katz because she missed her appointment with Dr. Delorse Lek and needed refills. She states that she is not happy with the changes that were made to the medication regiment and she would like to have things handled with Dr. Delorse Lek. Can you please advise to the change in Advair due to insurance coverage to appease the patient? I did scheduled her follow up sooner to see you so that the medications could be discussed.

## 2023-10-24 DIAGNOSIS — Z6830 Body mass index (BMI) 30.0-30.9, adult: Secondary | ICD-10-CM | POA: Insufficient documentation

## 2023-10-24 DIAGNOSIS — Z23 Encounter for immunization: Secondary | ICD-10-CM | POA: Insufficient documentation

## 2023-10-24 DIAGNOSIS — Z2821 Immunization not carried out because of patient refusal: Secondary | ICD-10-CM | POA: Insufficient documentation

## 2023-10-24 MED ORDER — FLUTICASONE-SALMETEROL 115-21 MCG/ACT IN AERO: 2.0000 | INHALATION_SPRAY | Freq: Two times a day (BID) | RESPIRATORY_TRACT | 5 refills | Status: DC

## 2023-10-24 NOTE — Telephone Encounter (Signed)
Sent in generic advair 115 to pts pharmacy as it appears to be covered by insurance

## 2023-10-24 NOTE — Assessment & Plan Note (Signed)

## 2023-10-24 NOTE — Assessment & Plan Note (Signed)
She is encouraged to strive for BMI less than 30 to decrease cardiac risk. Advised to aim for at least 150 minutes of exercise per week.

## 2023-10-24 NOTE — Assessment & Plan Note (Signed)
Hgba1 stable, will recheck HgbA1c and encouraged to take medication as directed.

## 2023-10-24 NOTE — Assessment & Plan Note (Signed)
Influenza vaccine administered Encouraged to take Tylenol as needed for fever or muscle aches.

## 2023-10-25 ENCOUNTER — Ambulatory Visit
Admission: EM | Admit: 2023-10-25 | Discharge: 2023-10-25 | Disposition: A | Discharge status: Home / Self Care | Payer: 59 | Attending: Internal Medicine | Admitting: Internal Medicine

## 2023-10-25 DIAGNOSIS — L84 Corns and callosities: Secondary | ICD-10-CM | POA: Diagnosis not present

## 2023-10-25 NOTE — ED Triage Notes (Signed)
Pt presents to UC w/ c/o corn on bottom of bilateral feet x2 weeks. December 3rd foot doctor appt Hx foot ulcer for corns on bottom of feet

## 2023-10-25 NOTE — Discharge Instructions (Signed)
Follow-up with your podiatrist at your scheduled appointment in December for further treatment.  You may keep areas covered and cushion to help with discomfort.  I hope you feel better soon!

## 2023-10-25 NOTE — ED Provider Notes (Signed)
[facility]    CSN: [id number] Arrival date & time: 10/25/23  1329      History   Chief Complaint No chief complaint on file.   HPI Pamela Roberson is a 67 y.o. female presents for evaluation of corns.  Patient is a type II diabetic with a history of corns on bilateral feet.  Reports she has had corns for the couple weeks that are worsening.  She does have an appointment with her podiatrist on December 2.  She denies any drainage, swelling, warmth.  No fevers or chills.  No other concerns at this time.  HPI  Past Medical History:  Diagnosis Date   Arthritis    Asthma    Carotid stenosis, bilateral    Carpal tunnel syndrome    Chronic low back pain    COPD (chronic obstructive pulmonary disease) (HCC)    Diabetes mellitus without complication (HCC)    type II    Diabetic neuropathy (HCC)    Diverticulitis    Dyspnea    mild    Frequent falls    GERD (gastroesophageal reflux disease)    History of bronchitis    Hypercholesterolemia    Hypertension    IBS (irritable bowel syndrome)    Osteoarthritis    knee   Palpitations    Right ventricular enlargement    per pt report   Sleep apnea    mild but no sleep apnea    TMJ (dislocation of temporomandibular joint)    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    Vitamin D deficiency     Patient Active Problem List   Diagnosis Date Noted   COVID-19 vaccination declined 10/24/2023   Need for influenza vaccination 10/24/2023   Class 1 obesity without serious comorbidity with body mass index (BMI) of 30.0 to 30.9 in adult 10/24/2023   Encounter for annual health examination 08/31/2023   Type 2 diabetes mellitus with diabetic neuropathy, without long-term current use of insulin (HCC) 08/31/2023   Estrogen deficiency 08/31/2023   Herpes zoster vaccination declined 08/31/2023   Influenza vaccination declined 08/31/2023   Post-nasal drip 08/31/2023   Muscle weakness (generalized) 08/31/2023   Degeneration of  lumbar intervertebral disc 07/26/2023   Diabetic peripheral neuropathy (HCC) 07/26/2023   Left leg weakness 07/26/2023   Lumbar radiculopathy 07/26/2023   Pain in left knee 02/15/2022   Sciatica, left side 02/15/2022   Pain in left shoulder 11/16/2021   Internal hemorrhoids 11/03/2021   Leg cramps 11/03/2021   Myalgia 11/03/2021   Adenomatous polyp of colon 12/04/2020   Anal fissure 12/04/2020   Chronic idiopathic constipation 12/04/2020   Constipation 12/04/2020   Dysphagia 12/04/2020   Gastroesophageal reflux disease 12/04/2020   Rectal pain 12/04/2020   Right upper quadrant pain 12/04/2020   Dizzy 12/11/2017   Pharyngeal dysphagia 12/11/2017   Referred ear pain, bilateral 12/11/2017   Diabetes mellitus without complication (HCC) 06/29/2017   Family history of glaucoma 06/29/2017   Hyperopia of both eyes with astigmatism and presbyopia 06/29/2017   Keratoconjunctivitis sicca of both eyes not specified as Sjogren's 06/29/2017   Nuclear sclerotic cataract of both eyes 06/29/2017   Vitreous floater, bilateral 06/29/2017   Biceps tendinitis of right shoulder 04/05/2017   Impingement syndrome of right shoulder 04/05/2017   Arthritis of right acromioclavicular joint 04/05/2017   Partial nontraumatic tear of rotator cuff, right 04/05/2017    Past Surgical History:  Procedure Laterality Date   ABDOMINAL HYSTERECTOMY  1993  CARPAL TUNNEL RELEASE Right    CERVICAL DISC SURGERY  04/29/2015   [place]   ESOPHAGEAL MANOMETRY N/A 03/03/2021   Procedure: ESOPHAGEAL MANOMETRY (EM);  Surgeon: Charlott Rakes, MD;  Location: [facility];  Service: Endoscopy;  Laterality: N/A;   ESOPHAGOGASTRODUODENOSCOPY ENDOSCOPY     with esophagus being stretched twice per pt,    EYE SURGERY     fatty 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      feeding tube placement and removal   2015   NASAL SEPTUM SURGERY     ROTATOR CUFF REPAIR Right    spurs removed from esophagus   2015    TONSILLECTOMY      OB History   No obstetric history on file.      Home Medications    Prior to Admission medications   Medication Sig Start Date End Date Taking? Authorizing Provider  acyclovir (ZOVIRAX) 400 MG tablet Take 1 tablet (400 mg total) by mouth 2 (two) times daily. 04/07/23   Meriam Sprague, MD  ADVAIR Avala 115-21 MCG/ACT inhaler Inhale 2 puffs into the lungs 2 (two) times daily. 09/13/23   Birder Robson, MD  albuterol (PROVENTIL) (2.5 MG/3ML) 0.083% nebulizer solution Take 3 mLs (2.5 mg total) by nebulization every 4 (four) hours as needed for wheezing or shortness of breath. 07/31/23   Marcelyn Bruins, MD  amLODipine (NORVASC) 5 MG tablet Take 1 tablet (5 mg total) by mouth daily. 10/10/23   Tobb, Kardie, DO  Artificial Tear Ointment (DRY EYES OP) Place 1-2 drops into both eyes daily as needed (for dry eyes).     [provider]  ascorbic acid (VITAMIN C) 1000 MG tablet Take 1 tablet by mouth daily.    [provider]  azelastine (ASTELIN) 0.1 % nasal spray Place 1 spray into both nostrils 2 (two) times daily as needed. Use in each nostril as directed 09/13/23   Birder Robson, MD  azelastine (OPTIVAR) 0.05 % ophthalmic solution Place 1 drop into both eyes 2 (two) times daily. 02/24/23   Marcelyn Bruins, MD  Blood Glucose Calibration (TRUE METRIX LEVEL 1) Low SOLN  09/20/21   [provider]  Blood Glucose Monitoring Suppl (TRUE METRIX METER) w/Device KIT  09/20/21   [provider]  budesonide-formoterol (SYMBICORT) 80-4.5 MCG/ACT inhaler Inhale 2 puffs into the lungs in the morning and at bedtime. 10/23/23   Birder Robson, MD  cholecalciferol (VITAMIN D3) 25 MCG (1000 UNIT) tablet Take 2,000 Units by mouth daily.    [provider]  cycloSPORINE (RESTASIS) 0.05 % ophthalmic emulsion Place 1 drop into both eyes 2 times daily. 03/08/22   [provider]  EPINEPHrine 0.3 mg/0.3 mL IJ SOAJ injection Inject  0.3 mg into the muscle as needed for anaphylaxis. 07/31/23   Marcelyn Bruins, MD  fluticasone (FLONASE) 50 MCG/ACT nasal spray Place 1 spray into both nostrils daily. 09/13/23   Birder Robson, MD  fluticasone-salmeterol (ADVAIR HFA) 244-01 MCG/ACT inhaler Inhale 2 puffs into the lungs 2 (two) times daily. 10/24/23   Marcelyn Bruins, MD  levocetirizine (XYZAL) 5 MG tablet Take 1 tablet (5 mg total) by mouth every evening. 09/13/23   Birder Robson, MD  metoprolol succinate (TOPROL-XL) 25 MG 24 hr tablet Take 1 tablet (25 mg total) by mouth daily. 10/10/23   Tobb, Kardie, DO  montelukast (SINGULAIR) 10 MG tablet Take 1 tablet (10 mg total) by mouth daily. 09/13/23   Alesia Morin  P, MD  Multiple Vitamin (MULTIVITAMIN WITH MINERALS) TABS tablet Take 1 tablet by mouth daily.    [provider]  ondansetron (ZOFRAN-ODT) 4 MG disintegrating tablet Take 1 tablet (4 mg total) by mouth every 8 (eight) hours as needed for nausea or vomiting. 05/19/23   Garrison, [place] N, FNP  pantoprazole (PROTONIX) 40 MG tablet Take 1 tablet (40 mg total) by mouth daily. 09/13/23   Birder Robson, MD  pravastatin (PRAVACHOL) 20 MG tablet Take 1 tablet (20 mg total) by mouth every evening. 10/10/23 10/09/24  Tobb, Lavona Mound, DO  promethazine-dextromethorphan (PROMETHAZINE-DM) 6.25-15 MG/5ML syrup Take 2.5 mLs by mouth 4 (four) times daily as needed for cough. 09/28/23   Radford Pax, NP  TRUE METRIX BLOOD GLUCOSE TEST test strip 1 each 3 (three) times daily. 09/20/21   [provider]  TRUEplus Lancets 33G MISC Apply 1 each topically 3 (three) times daily. 09/20/21   [provider]  VENTOLIN HFA 108 (90 Base) MCG/ACT inhaler INHALE 1 TO 2 PUFFS INTO THE LUNGS EVERY 6 HOURS AS NEEDED FOR WHEEZING OR SHORTNESS OF BREATH 09/25/23   Marcelyn Bruins, MD  esomeprazole (NEXIUM) 40 MG capsule Take 1 capsule (40 mg total) by mouth daily. Patient not taking: Reported on 07/08/2020 04/24/19  08/06/20  Arby Barrette, MD    Family History Family History  Problem Relation Age of Onset   Allergic rhinitis Mother    Diabetes Father        type 2   Heart disease Father    Hypertension Father    Allergic rhinitis Father    Allergic rhinitis Sister    Collagen disease Sister    Heart disease Brother    Hypertension Brother    Angioedema Neg Hx    Asthma Neg Hx    Atopy Neg Hx    Eczema Neg Hx    Immunodeficiency Neg Hx    Urticaria Neg Hx     Social History Social History   Tobacco Use   Smoking status: Former    Current packs/day: 0.00    Average packs/day: 0.5 packs/day for 30.0 years (15.0 ttl pk-yrs)    Types: Cigarettes    Start date: 12/08/1964    Quit date: 12/08/1994    Years since quitting: 28.8   Smokeless tobacco: Never  Vaping Use   Vaping status: Never Used  Substance Use Topics   Alcohol use: No   Drug use: No     Allergies   Crab [shellfish allergy], Erythromycin, Hm lidocaine patch [lidocaine], Erdafitinib, Fish allergy, Metoclopramide, Sulfacetamide, Sulfacetamide sodium-sulfur, Doxycycline, Levaquin [levofloxacin in d5w], Morphine, and Sulfa antibiotics   Review of Systems Review of Systems  Skin:        Bilateral feet corns     Physical Exam Triage Vital Signs ED Triage Vitals  Encounter Vitals Group     BP 10/25/23 1501 (!) 141/82     Systolic BP Percentile --      Diastolic BP Percentile --      Pulse Rate 10/25/23 1501 75     Resp 10/25/23 1501 18     Temp 10/25/23 1501 98 F (36.7 C)     Temp Source 10/25/23 1501 Oral     SpO2 10/25/23 1501 96 %     Weight --      Height --      Head Circumference --      Peak Flow --      Pain Score 10/25/23 1457 8  Pain Loc --      Pain Education --      Exclude from Growth Chart --    No data found.  Updated Vital Signs BP (!) 141/82 (BP Location: Right Arm)   Pulse 75   Temp 98 F (36.7 C) (Oral)   Resp 18   SpO2 96%   Visual Acuity Right Eye Distance:   Left Eye  Distance:   Bilateral Distance:    Right Eye Near:   Left Eye Near:    Bilateral Near:     Physical Exam Vitals and nursing note reviewed.  Constitutional:      Appearance: Normal appearance.  HENT:     Head: Normocephalic and atraumatic.  Eyes:     Pupils: Pupils are equal, round, and reactive to light.  Cardiovascular:     Rate and Rhythm: Normal rate.  Pulmonary:     Effort: Pulmonary effort is normal.  Musculoskeletal:       Feet:  Feet:     Comments: Patient has 1 large corn on her plantar aspect of the lateral foot as well as 2 on the plantar aspect of the left foot.  There is no drainage, swelling, warmth.  Skin:    General: Skin is warm and dry.  Neurological:     General: No focal deficit present.     Mental Status: She is alert and oriented to person, place, and time.  Psychiatric:        Mood and Affect: Mood normal.        Behavior: Behavior normal.      UC Treatments / Results  Labs (all labs ordered are listed, but only abnormal results are displayed) Labs Reviewed - No data to display  EKG   Radiology No results found.  Procedures Procedures (including critical care time)  Medications Ordered in UC Medications - No data to display  Initial Impression / Assessment and Plan / UC Course  I have reviewed the triage vital signs and the nursing notes.  Pertinent labs & imaging results that were available during my care of the patient were reviewed by me and considered in my medical decision making (see chart for details).     Reviewed concerns with patient.  Discussed limitations and abilities of urgent care.  As she is diabetic advised that she follow-up with her podiatrist at her scheduled appointment for corn treatment/removal.  We did apply dressings for cushion/comfort.  Discussed there are over-the-counter options as well.  Patient follow-up with PCP as needed.  ER precautions reviewed. Final Clinical Impressions(s) / UC Diagnoses   Final  diagnoses:  Corn of foot     Discharge Instructions      Follow-up with your podiatrist at your scheduled appointment in December for further treatment.  You may keep areas covered and cushion to help with discomfort.  I hope you feel better soon!    ED Prescriptions   None    PDMP not reviewed this encounter.   Radford Pax, NP 10/25/23 1525

## 2023-11-07 ENCOUNTER — Telehealth: Payer: Self-pay

## 2023-11-07 ENCOUNTER — Encounter: Payer: Self-pay | Admitting: Podiatry

## 2023-11-07 ENCOUNTER — Ambulatory Visit (INDEPENDENT_AMBULATORY_CARE_PROVIDER_SITE_OTHER): Payer: 59 | Admitting: Podiatry

## 2023-11-07 DIAGNOSIS — M79674 Pain in right toe(s): Secondary | ICD-10-CM

## 2023-11-07 DIAGNOSIS — B351 Tinea unguium: Secondary | ICD-10-CM | POA: Diagnosis not present

## 2023-11-07 DIAGNOSIS — E1142 Type 2 diabetes mellitus with diabetic polyneuropathy: Secondary | ICD-10-CM | POA: Diagnosis not present

## 2023-11-07 DIAGNOSIS — M79675 Pain in left toe(s): Secondary | ICD-10-CM | POA: Diagnosis not present

## 2023-11-07 DIAGNOSIS — L84 Corns and callosities: Secondary | ICD-10-CM | POA: Diagnosis not present

## 2023-11-07 NOTE — Telephone Encounter (Signed)
Patient came in and asked for the status of her diabetic shoes. I was told her shoes were cancelled because her PCP did not fill out her paperwork. She wants to know if they can be reordered

## 2023-11-07 NOTE — Telephone Encounter (Signed)
Called and LM a message to CB, I will re-order once we receive documents from her Dr I will fax again today but no reason to open new order if we cannot get qualifying ppw (overseeing Dr needs to sign off on Ppw sent to NP)  Addison Bailey

## 2023-11-08 NOTE — Progress Notes (Signed)
Subjective:  Patient ID: Pamela Roberson, female    DOB: Aug 25, 1956,  MRN: [id number]  Pamela Roberson presents to clinic today for at risk foot care with history of diabetic neuropathy and callus(es) of both feet and painful thick toenails that are difficult to trim. Painful toenails interfere with ambulation. Aggravating factors include wearing enclosed shoe gear. Pain is relieved with periodic professional debridement. Painful calluses are aggravated when weightbearing with and without shoegear. Pain is relieved with periodic professional debridement. Patient states her calluses were so painful that she sought help at Urgent Care. Chief Complaint  Patient presents with   Diabetes    A1C 09/24, PCP Penelope Galas NP Last Ov 08/04   Foot Pain    DCF, Pain in both feet per patient    PCP is [name], FNP.  Allergies  Allergen Reactions   Crab [Shellfish Allergy] Shortness Of Breath and Swelling   Erythromycin Swelling and Rash   Hm Lidocaine Patch [Lidocaine]     Burning on skin   Erdafitinib     Other reaction(s): pt not sure   Fish Allergy     Swelling    Metoclopramide Other (See Comments)    Slurred speech and unable to move muscles,  Caused her to drag her feet   Sulfacetamide     Other reaction(s): itching, rash   Sulfacetamide Sodium-Sulfur Other (See Comments)    Other reaction(s): itching, rash   Doxycycline     GI Intolerance   Levaquin [Levofloxacin In D5w] Other (See Comments)    Causes irregular heart beat   Morphine Nausea And Vomiting and Rash    Irregular heart beat   Sulfa Antibiotics Rash    Review of Systems: Negative except as noted in the HPI.  Objective: No changes noted in today's physical examination. There were no vitals filed for this visit. Pamela Roberson is a pleasant 67 y.o. female in NAD. AAO x 3.  Vascular Examination: Capillary refill time immediate b/l. Vascular status intact b/l with palpable DP pulses; faintly  palpable PT pulses. Pedal hair absent b/l. No edema. No pain with calf compression b/l. Skin temperature gradient WNL b/l. No cyanosis or clubbing noted b/l. No ischemia or gangrene b/l.   Neurological Examination: Sensation grossly intact b/l with 10 gram monofilament. Vibratory sensation intact b/l. Pt has subjective symptoms of neuropathy.  Dermatological Examination: Pedal skin with normal turgor, texture and tone b/l.  No open wounds. No interdigital macerations.   Toenails 1-5 b/l thick, discolored, elongated with subungual debris and pain on dorsal palpation.   Hyperkeratotic lesion(s) submet head 1 left foot, submet head 5 b/l, and sub 5th met base right foot.  No erythema, no edema, no drainage, no fluctuance.  Musculoskeletal Examination: Muscle strength 5/5 to all lower extremity muscle groups bilaterally. Current pair of Crocs type mules are worn on the sole. No pain, crepitus or joint limitation noted with ROM bilateral LE. 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 Antalgic gait noted.  Radiographs: None  Last A1c:      Latest Ref Rng & Units 08/21/2023   11:56 AM 04/06/2023   12:27 PM  Hemoglobin A1C  Hemoglobin-A1c 4.8 - 5.6 % 7.2  7.1    Assessment/Plan: 1. Pain due to onychomycosis of toenails of both feet   2. Callus   3. Diabetic peripheral neuropathy associated with type 2 diabetes mellitus (HCC)     -Patient was evaluated today. All questions/concerns addressed on  today's visit. -Advised patient to contact Orthotics and Prosthetics Department regarding diabetic shoe order. Current pair of shoes is worn and this could be contributing to her callus pain. She is currently awaiting delivery of her new diabetic shoes. Recommended she purchase Skechers with memory foam insoles in the meantime. She related understanding. -Patient to continue soft, supportive shoe gear daily. -Toenails 1-5 b/l were debrided in length and girth with  sterile nail nippers and dremel without iatrogenic bleeding.  -Callus(es) submet head 1 left foot, submet head 5 b/l, and sub 5th met base right foot pared utilizing sterile scalpel blade without complication or incident. Total number debrided =4. -Patient/POA to call should there be question/concern in the interim.   Return in about 3 months (around 02/05/2024).  Freddie Breech, DPM      [facility] LOCATION: [address] [place] [address]                   Office [phone]   [facility] LOCATION: [address] [place], [place] [address] Office [phone]

## 2023-11-13 ENCOUNTER — Institutional Professional Consult (permissible substitution) (INDEPENDENT_AMBULATORY_CARE_PROVIDER_SITE_OTHER): Payer: 59

## 2023-11-16 DIAGNOSIS — M5416 Radiculopathy, lumbar region: Secondary | ICD-10-CM | POA: Diagnosis not present

## 2023-11-16 DIAGNOSIS — E1142 Type 2 diabetes mellitus with diabetic polyneuropathy: Secondary | ICD-10-CM | POA: Diagnosis not present

## 2023-11-24 NOTE — Telephone Encounter (Signed)
 Results abstracted and Care Team updated

## 2023-11-26 ENCOUNTER — Ambulatory Visit (HOSPITAL_COMMUNITY)
Admission: EM | Admit: 2023-11-26 | Discharge: 2023-11-26 | Disposition: A | Discharge status: Home / Self Care | Payer: 59 | Attending: Internal Medicine | Admitting: Internal Medicine

## 2023-11-26 ENCOUNTER — Encounter (HOSPITAL_COMMUNITY): Payer: Self-pay | Admitting: Emergency Medicine

## 2023-11-26 ENCOUNTER — Ambulatory Visit (HOSPITAL_COMMUNITY): Payer: 59

## 2023-11-26 ENCOUNTER — Ambulatory Visit (INDEPENDENT_AMBULATORY_CARE_PROVIDER_SITE_OTHER): Payer: 59

## 2023-11-26 DIAGNOSIS — R059 Cough, unspecified: Secondary | ICD-10-CM | POA: Diagnosis not present

## 2023-11-26 DIAGNOSIS — J0100 Acute maxillary sinusitis, unspecified: Secondary | ICD-10-CM | POA: Diagnosis not present

## 2023-11-26 DIAGNOSIS — R052 Subacute cough: Secondary | ICD-10-CM

## 2023-11-26 DIAGNOSIS — R0602 Shortness of breath: Secondary | ICD-10-CM | POA: Diagnosis not present

## 2023-11-26 MED ORDER — PREDNISONE 10 MG PO TABS
40.0000 mg | ORAL_TABLET | Freq: Every day | ORAL | 0 refills | Status: AC
Start: 2023-11-26 — End: 2023-12-01

## 2023-11-26 MED ORDER — ONDANSETRON 4 MG PO TBDP
4.0000 mg | ORAL_TABLET | Freq: Three times a day (TID) | ORAL | 0 refills | Status: AC | PRN
Start: 2023-11-26 — End: ?

## 2023-11-26 MED ORDER — AMOXICILLIN-POT CLAVULANATE 875-125 MG PO TABS
1.0000 | ORAL_TABLET | Freq: Two times a day (BID) | ORAL | 0 refills | Status: DC
Start: 2023-11-26 — End: 2024-02-06

## 2023-11-26 NOTE — ED Provider Notes (Signed)
[facility]    CSN: [id number] Arrival date & time: 11/26/23  1253      History   Chief Complaint Chief Complaint  Patient presents with   Nasal Congestion   Otalgia   Cough    HPI Pamela Roberson is a 67 y.o. female.   67 y.o. female who presents to urgent care with complaints of persistent cough, nasal congestion, ear pain, nausea and vomiting.  She also developed some eye drainage in the last week.  Her other symptoms have been ongoing since August or September.  She has been evaluated several times at urgent care secondary to this.  She has been given amoxicillin as well as medication for cough but she reports that this has not been helping.  Her symptoms will get better for short period of time and then worsen again.  Also in the last week she has developed more shortness of breath from mucus in the nose and facial pain.  She denies chest pain, diarrhea, abdominal pain, urinary symptoms.  She is diabetic and her blood sugars have been well-controlled recently.   Otalgia Associated symptoms: congestion and cough   Associated symptoms: no abdominal pain, no fever, no rash, no sore throat and no vomiting   Cough Associated symptoms: chills, ear pain and shortness of breath   Associated symptoms: no chest pain, no fever, no rash and no sore throat     Past Medical History:  Diagnosis Date   Arthritis    Asthma    Carotid stenosis, bilateral    Carpal tunnel syndrome    Chronic low back pain    COPD (chronic obstructive pulmonary disease) (HCC)    Diabetes mellitus without complication (HCC)    type II    Diabetic neuropathy (HCC)    Diverticulitis    Dyspnea    mild    Frequent falls    GERD (gastroesophageal reflux disease)    History of bronchitis    Hypercholesterolemia    Hypertension    IBS (irritable bowel syndrome)    Osteoarthritis    knee   Palpitations    Right ventricular enlargement    per pt report   Sleep apnea    mild but no  sleep apnea    TMJ (dislocation of temporomandibular joint)    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    Vitamin D deficiency     Patient Active Problem List   Diagnosis Date Noted   COVID-19 vaccination declined 10/24/2023   Need for influenza vaccination 10/24/2023   Class 1 obesity without serious comorbidity with body mass index (BMI) of 30.0 to 30.9 in adult 10/24/2023   Encounter for annual health examination 08/31/2023   Type 2 diabetes mellitus with diabetic neuropathy, without long-term current use of insulin (HCC) 08/31/2023   Estrogen deficiency 08/31/2023   Herpes zoster vaccination declined 08/31/2023   Influenza vaccination declined 08/31/2023   Post-nasal drip 08/31/2023   Muscle weakness (generalized) 08/31/2023   Degeneration of lumbar intervertebral disc 07/26/2023   Diabetic peripheral neuropathy (HCC) 07/26/2023   Left leg weakness 07/26/2023   Lumbar radiculopathy 07/26/2023   Pain in left knee 02/15/2022   Sciatica, left side 02/15/2022   Pain in left shoulder 11/16/2021   Internal hemorrhoids 11/03/2021   Leg cramps 11/03/2021   Myalgia 11/03/2021   Adenomatous polyp of colon 12/04/2020   Anal fissure 12/04/2020   Chronic idiopathic constipation 12/04/2020   Constipation 12/04/2020   Dysphagia 12/04/2020  Gastroesophageal reflux disease 12/04/2020   Rectal pain 12/04/2020   Right upper quadrant pain 12/04/2020   Dizzy 12/11/2017   Pharyngeal dysphagia 12/11/2017   Referred ear pain, bilateral 12/11/2017   Diabetes mellitus without complication (HCC) 06/29/2017   Family history of glaucoma 06/29/2017   Hyperopia of both eyes with astigmatism and presbyopia 06/29/2017   Keratoconjunctivitis sicca of both eyes not specified as Sjogren's 06/29/2017   Nuclear sclerotic cataract of both eyes 06/29/2017   Vitreous floater, bilateral 06/29/2017   Biceps tendinitis of right shoulder 04/05/2017   Impingement syndrome of right shoulder 04/05/2017    Arthritis of right acromioclavicular joint 04/05/2017   Partial nontraumatic tear of rotator cuff, right 04/05/2017    Past Surgical History:  Procedure Laterality Date   ABDOMINAL HYSTERECTOMY  1993   CARPAL TUNNEL RELEASE Right    CERVICAL DISC SURGERY  04/29/2015   Dr [place]rie [place]   ESOPHAGEAL MANOMETRY N/A 03/03/2021   Procedure: ESOPHAGEAL MANOMETRY (EM);  Surgeon: Charlott Rakes, MD;  Location: [facility];  Service: Endoscopy;  Laterality: N/A;   ESOPHAGOGASTRODUODENOSCOPY ENDOSCOPY     with esophagus being stretched twice per pt,    EYE SURGERY     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      Home Medications    Prior to Admission medications   Medication Sig Start Date End Date Taking? Authorizing Provider  ondansetron (ZOFRAN-ODT) 4 MG disintegrating tablet Take 1 tablet (4 mg total) by mouth every 8 (eight) hours as needed for nausea or vomiting. 11/26/23  Yes Antania Hoefling A, PA-C  predniSONE (DELTASONE) 10 MG tablet Take 4 tablets (40 mg total) by mouth daily with breakfast for 5 days. 11/26/23 12/01/23 Yes Judi Jaffe A, PA-C  acyclovir (ZOVIRAX) 400 MG tablet Take 1 tablet (400 mg total) by mouth 2 (two) times daily. Patient not taking: Reported on 11/26/2023 04/07/23   Meriam Sprague, MD  ADVAIR [facility] 115-21 MCG/ACT inhaler Inhale 2 puffs into the lungs 2 (two) times daily. 09/13/23   Birder Robson, MD  albuterol (PROVENTIL) (2.5 MG/3ML) 0.083% nebulizer solution Take 3 mLs (2.5 mg total) by nebulization every 4 (four) hours as needed for wheezing or shortness of breath. 07/31/23   Marcelyn Bruins, MD  amLODipine (NORVASC) 5 MG tablet Take 1 tablet (5 mg total) by mouth daily. 10/10/23   Tobb, Kardie, DO  Artificial Tear Ointment (DRY  EYES OP) Place 1-2 drops into both eyes daily as needed (for dry eyes).     [provider]  ascorbic acid (VITAMIN C) 1000 MG tablet Take 1 tablet by mouth daily.    [provider]  azelastine (ASTELIN) 0.1 % nasal spray Place 1 spray into both nostrils 2 (two) times daily as needed. Use in each nostril as directed 09/13/23   Birder Robson, MD  azelastine (OPTIVAR) 0.05 % ophthalmic solution Place 1 drop into both eyes 2 (two) times daily. 02/24/23   Marcelyn Bruins, MD  Blood Glucose Calibration (TRUE METRIX LEVEL 1) Low SOLN  09/20/21   [provider]  Blood Glucose Monitoring Suppl (TRUE METRIX METER) w/Device KIT  09/20/21   [provider]  budesonide-formoterol (SYMBICORT)  80-4.5 MCG/ACT inhaler Inhale 2 puffs into the lungs in the morning and at bedtime. 10/23/23   Birder Robson, MD  cholecalciferol (VITAMIN D3) 25 MCG (1000 UNIT) tablet Take 2,000 Units by mouth daily.    [provider]  cycloSPORINE (RESTASIS) 0.05 % ophthalmic emulsion Place 1 drop into both eyes 2 times daily. 03/08/22   [provider]  EPINEPHrine 0.3 mg/0.3 mL IJ SOAJ injection Inject 0.3 mg into the muscle as needed for anaphylaxis. 07/31/23   Marcelyn Bruins, MD  fluticasone (FLONASE) 50 MCG/ACT nasal spray Place 1 spray into both nostrils daily. 09/13/23   Birder Robson, MD  fluticasone-salmeterol (ADVAIR HFA) 952-84 MCG/ACT inhaler Inhale 2 puffs into the lungs 2 (two) times daily. 10/24/23   Marcelyn Bruins, MD  levocetirizine (XYZAL) 5 MG tablet Take 1 tablet (5 mg total) by mouth every evening. 09/13/23   Birder Robson, MD  metoprolol succinate (TOPROL-XL) 25 MG 24 hr tablet Take 1 tablet (25 mg total) by mouth daily. 10/10/23   Tobb, Kardie, DO  montelukast (SINGULAIR) 10 MG tablet Take 1 tablet (10 mg total) by mouth daily. 09/13/23   Birder Robson, MD  Multiple Vitamin (MULTIVITAMIN WITH MINERALS) TABS tablet Take 1 tablet by  mouth daily.    [provider]  pantoprazole (PROTONIX) 40 MG tablet Take 1 tablet (40 mg total) by mouth daily. 09/13/23   Birder Robson, MD  pravastatin (PRAVACHOL) 20 MG tablet Take 1 tablet (20 mg total) by mouth every evening. 10/10/23 10/09/24  Tobb, Lavona Mound, DO  promethazine-dextromethorphan (PROMETHAZINE-DM) 6.25-15 MG/5ML syrup Take 2.5 mLs by mouth 4 (four) times daily as needed for cough. Patient not taking: Reported on 11/26/2023 09/28/23   Radford Pax, NP  TRUE METRIX BLOOD GLUCOSE TEST test strip 1 each 3 (three) times daily. 09/20/21   [provider]  TRUEplus Lancets 33G MISC Apply 1 each topically 3 (three) times daily. 09/20/21   [provider]  VENTOLIN HFA 108 (90 Base) MCG/ACT inhaler INHALE 1 TO 2 PUFFS INTO THE LUNGS EVERY 6 HOURS AS NEEDED FOR WHEEZING OR SHORTNESS OF BREATH 09/25/23   Marcelyn Bruins, MD  esomeprazole (NEXIUM) 40 MG capsule Take 1 capsule (40 mg total) by mouth daily. Patient not taking: Reported on 07/08/2020 04/24/19 08/06/20  Arby Barrette, MD    Family History Family History  Problem Relation Age of Onset   Allergic rhinitis Mother    Diabetes Father        type 2   Heart disease Father    Hypertension Father    Allergic rhinitis Father    Allergic rhinitis Sister    Collagen disease Sister    Heart disease Brother    Hypertension Brother    Angioedema Neg Hx    Asthma Neg Hx    Atopy Neg Hx    Eczema Neg Hx    Immunodeficiency Neg Hx    Urticaria Neg Hx     Social History Social History   Tobacco Use   Smoking status: Former    Current packs/day: 0.00    Average packs/day: 0.5 packs/day for 30.0 years (15.0 ttl pk-yrs)    Types: Cigarettes    Start date: 12/08/1964    Quit date: 12/08/1994    Years since quitting: 28.9   Smokeless tobacco: Never  Vaping Use   Vaping status: Never Used  Substance Use Topics   Alcohol use: No   Drug use: No  Allergies   Crab [shellfish allergy],  Erythromycin, Hm lidocaine patch [lidocaine], Erdafitinib, Fish allergy, Metoclopramide, Sulfacetamide, Sulfacetamide sodium-sulfur, Doxycycline, Levaquin [levofloxacin in d5w], Morphine, and Sulfa antibiotics   Review of Systems Review of Systems  Constitutional:  Positive for appetite change and chills. Negative for fever.  HENT:  Positive for congestion, ear pain and sinus pressure. Negative for sore throat.   Eyes:  Negative for pain and visual disturbance.  Respiratory:  Positive for cough and shortness of breath.   Cardiovascular:  Negative for chest pain and palpitations.  Gastrointestinal:  Negative for abdominal pain and vomiting.  Genitourinary:  Negative for dysuria and hematuria.  Musculoskeletal:  Negative for arthralgias and back pain.  Skin:  Negative for color change and rash.  Neurological:  Negative for seizures and syncope.  All other systems reviewed and are negative.    Physical Exam Triage Vital Signs ED Triage Vitals  Encounter Vitals Group     BP 11/26/23 1350 123/83     Systolic BP Percentile --      Diastolic BP Percentile --      Pulse Rate 11/26/23 1350 95     Resp 11/26/23 1350 18     Temp 11/26/23 1350 98.1 F (36.7 C)     Temp Source 11/26/23 1350 Oral     SpO2 11/26/23 1350 98 %     Weight --      Height --      Head Circumference --      Peak Flow --      Pain Score 11/26/23 1348 7     Pain Loc --      Pain Education --      Exclude from Growth Chart --    No data found.  Updated Vital Signs BP 123/83   Pulse 95   Temp 98.1 F (36.7 C) (Oral)   Resp 18   SpO2 98%   Visual Acuity Right Eye Distance:   Left Eye Distance:   Bilateral Distance:    Right Eye Near:   Left Eye Near:    Bilateral Near:     Physical Exam Vitals and nursing note reviewed.  Constitutional:      General: She is not in acute distress.    Appearance: She is well-developed.  HENT:     Head: Normocephalic and atraumatic.     Right Ear: Tympanic  membrane normal. There is no impacted cerumen.     Left Ear: Tympanic membrane normal. There is no impacted cerumen.     Nose: Congestion present.     Mouth/Throat:     Mouth: Mucous membranes are moist.     Pharynx: Posterior oropharyngeal erythema present.  Eyes:     Conjunctiva/sclera: Conjunctivae normal.  Cardiovascular:     Rate and Rhythm: Normal rate and regular rhythm.     Heart sounds: No murmur heard. Pulmonary:     Effort: Pulmonary effort is normal. No respiratory distress.     Breath sounds: Examination of the left-upper field reveals decreased breath sounds. Examination of the left-middle field reveals decreased breath sounds. Decreased breath sounds present. No wheezing, rhonchi or rales.  Abdominal:     Palpations: Abdomen is soft.     Tenderness: There is no abdominal tenderness.  Musculoskeletal:        General: No swelling.     Cervical back: Neck supple.  Skin:    General: Skin is warm and dry.     Capillary Refill: Capillary refill takes less than  2 seconds.  Neurological:     Mental Status: She is alert.  Psychiatric:        Mood and Affect: Mood normal.      UC Treatments / Results  Labs (all labs ordered are listed, but only abnormal results are displayed) Labs Reviewed - No data to display  EKG   Radiology No results found.  Procedures Procedures (including critical care time)  Medications Ordered in UC Medications - No data to display  Initial Impression / Assessment and Plan / UC Course  I have reviewed the triage vital signs and the nursing notes.  Pertinent labs & imaging results that were available during my care of the patient were reviewed by me and considered in my medical decision making (see chart for details).     Subacute cough - Plan: DG Chest 2 View, DG Chest 2 View  Acute non-recurrent maxillary sinusitis   Chest x-ray done today and is negative for pneumonia. Symptoms have been going on for 2 to 3 months however  significant worsening in the last week. With the congestion and sinus symptoms, this is likely a sinus infection.  We will treat with the following:  Augmentin 875 mg twice daily for 7 days.  Zofran 4mg  orally disintegrating every 8 hours as needed for nausea Prednisone 40mg  daily for 5 days. Take this in the morning. Monitor your blood sugars closely while on this medication.  Rest and hydrate Return to urgent care or PCP if symptoms worsen or fail to resolve.    Final Clinical Impressions(s) / UC Diagnoses   Final diagnoses:  Subacute cough     Discharge Instructions        Zofran 4mg  orally disintegrating every 8 hours as needed for nausea Prednisone 40mg  daily for 5 days. Take this in the morning. Monitor your blood sugars closely while on this medication.    ED Prescriptions     Medication Sig Dispense Auth. Provider   ondansetron (ZOFRAN-ODT) 4 MG disintegrating tablet Take 1 tablet (4 mg total) by mouth every 8 (eight) hours as needed for nausea or vomiting. 20 tablet Lavan Imes A, PA-C   predniSONE (DELTASONE) 10 MG tablet Take 4 tablets (40 mg total) by mouth daily with breakfast for 5 days. 20 tablet Landis Martins, [place]      PDMP not reviewed this encounter.   Landis Martins, [place] 11/26/23 1452

## 2023-11-26 NOTE — Discharge Instructions (Addendum)
Chest x-ray done today and is negative for pneumonia. Symptoms have been going on for 2 to 3 months however significant worsening in the last week. With the congestion and sinus symptoms, this is likely a sinus infection.  We will treat with the following:  Augmentin 875 mg twice daily for 7 days.  Zofran 4mg  orally disintegrating every 8 hours as needed for nausea Prednisone 40mg  daily for 5 days. Take this in the morning. Monitor your blood sugars closely while on this medication.  Rest and hydrate Return to urgent care or PCP if symptoms worsen or fail to resolve.

## 2023-11-26 NOTE — ED Triage Notes (Addendum)
Pt c/o cough, ear pain, eye drainage, sob, nauseous, vomiting and congestion for 1 week.

## 2023-11-27 ENCOUNTER — Ambulatory Visit: Payer: 59 | Admitting: Nurse Practitioner

## 2023-11-28 ENCOUNTER — Ambulatory Visit: Payer: 59 | Admitting: Nurse Practitioner

## 2023-12-07 ENCOUNTER — Ambulatory Visit: Payer: 59 | Admitting: Nurse Practitioner

## 2023-12-07 NOTE — Progress Notes (Deleted)
 LILLETTE Kristeen JINNY Gladis, CMA,acting as a neurosurgeon for Pamela Ada, FNP.,have documented all relevant documentation on the behalf of Pamela Ada, FNP,as directed by  Pamela Ada, FNP while in the presence of Pamela Ada, FNP.  Subjective:  Patient ID: Pamela Roberson , female    DOB: [birth year] , 68 y.o.   MRN: [id number]  No chief complaint on file.   HPI  Patient presents today for a dm follow up, Patient reports compliance with medication. Patient denies any chest pain, SOB, or headaches. Patient has no concerns today.     Past Medical History:  Diagnosis Date  . Arthritis   . Asthma   . Carotid stenosis, bilateral   . Carpal tunnel syndrome   . Chronic low back pain   . COPD (chronic obstructive pulmonary disease) (HCC)   . Diabetes mellitus without complication (HCC)    type II   . Diabetic neuropathy (HCC)   . Diverticulitis   . Dyspnea    mild   . Frequent falls   . GERD (gastroesophageal reflux disease)   . History of bronchitis   . Hypercholesterolemia   . Hypertension   . IBS (irritable bowel syndrome)   . Osteoarthritis    knee  . Palpitations   . Right ventricular enlargement    per pt report  . Sleep apnea    mild but no sleep apnea   . TMJ (dislocation of temporomandibular joint)   . 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 Vitamin D deficiency      Family History  Problem Relation Age of Onset  . Allergic rhinitis Mother   . Diabetes Father        type 2  . Heart disease Father   . Hypertension Father   . Allergic rhinitis Father   . Allergic rhinitis Sister   . Collagen disease Sister   . Heart disease Brother   . Hypertension Brother   . Angioedema Neg Hx   . Asthma Neg Hx   . Atopy Neg Hx   . Eczema Neg Hx   . Immunodeficiency Neg Hx   . Urticaria Neg Hx      Current Outpatient Medications:  .  acyclovir  (ZOVIRAX ) 400 MG tablet, Take 1 tablet (400 mg total) by mouth 2 (two) times daily. (Patient not taking: Reported on 11/26/2023),  Disp: 30 tablet, Rfl: 4 .  ADVAIR  HFA 115-21 MCG/ACT inhaler, Inhale 2 puffs into the lungs 2 (two) times daily., Disp: 12 g, Rfl: 5 .  albuterol  (PROVENTIL ) (2.5 MG/3ML) 0.083% nebulizer solution, Take 3 mLs (2.5 mg total) by nebulization every 4 (four) hours as needed for wheezing or shortness of breath., Disp: 75 mL, Rfl: 1 .  amLODipine  (NORVASC ) 5 MG tablet, Take 1 tablet (5 mg total) by mouth daily., Disp: 90 tablet, Rfl: 3 .  amoxicillin -clavulanate (AUGMENTIN ) 875-125 MG tablet, Take 1 tablet by mouth every 12 (twelve) hours., Disp: 14 tablet, Rfl: 0 .  Artificial Tear Ointment (DRY EYES OP), Place 1-2 drops into both eyes daily as needed (for dry eyes). , Disp: , Rfl:  .  ascorbic acid (VITAMIN C) 1000 MG tablet, Take 1 tablet by mouth daily., Disp: , Rfl:  .  azelastine  (ASTELIN ) 0.1 % nasal spray, Place 1 spray into both nostrils 2 (two) times daily as needed. Use in each nostril as directed, Disp: 30 mL, Rfl: 5 .  azelastine  (OPTIVAR ) 0.05 % ophthalmic solution, Place 1 drop into both eyes 2 (two)  times daily., Disp: 6 mL, Rfl: 5 .  Blood Glucose Calibration (TRUE METRIX LEVEL 1) Low SOLN, , Disp: , Rfl:  .  Blood Glucose Monitoring Suppl (TRUE METRIX METER) w/Device KIT, , Disp: , Rfl:  .  budesonide -formoterol  (SYMBICORT ) 80-4.5 MCG/ACT inhaler, Inhale 2 puffs into the lungs in the morning and at bedtime., Disp: 1 each, Rfl: 5 .  cholecalciferol (VITAMIN D3) 25 MCG (1000 UNIT) tablet, Take 2,000 Units by mouth daily., Disp: , Rfl:  .  cycloSPORINE (RESTASIS) 0.05 % ophthalmic emulsion, Place 1 drop into both eyes 2 times daily., Disp: , Rfl:  .  EPINEPHrine  0.3 mg/0.3 mL IJ SOAJ injection, Inject 0.3 mg into the muscle as needed for anaphylaxis., Disp: 1 each, Rfl: 1 .  fluticasone  (FLONASE ) 50 MCG/ACT nasal spray, Place 1 spray into both nostrils daily., Disp: 16 g, Rfl: 5 .  fluticasone -salmeterol (ADVAIR  HFA) 115-21 MCG/ACT inhaler, Inhale 2 puffs into the lungs 2 (two) times daily.,  Disp: 1 each, Rfl: 5 .  levocetirizine (XYZAL ) 5 MG tablet, Take 1 tablet (5 mg total) by mouth every evening., Disp: 30 tablet, Rfl: 5 .  metoprolol  succinate (TOPROL -XL) 25 MG 24 hr tablet, Take 1 tablet (25 mg total) by mouth daily., Disp: 90 tablet, Rfl: 3 .  montelukast  (SINGULAIR ) 10 MG tablet, Take 1 tablet (10 mg total) by mouth daily., Disp: 30 tablet, Rfl: 5 .  Multiple Vitamin (MULTIVITAMIN WITH MINERALS) TABS tablet, Take 1 tablet by mouth daily., Disp: , Rfl:  .  ondansetron  (ZOFRAN -ODT) 4 MG disintegrating tablet, Take 1 tablet (4 mg total) by mouth every 8 (eight) hours as needed for nausea or vomiting., Disp: 20 tablet, Rfl: 0 .  pantoprazole  (PROTONIX ) 40 MG tablet, Take 1 tablet (40 mg total) by mouth daily., Disp: 30 tablet, Rfl: 5 .  pravastatin  (PRAVACHOL ) 20 MG tablet, Take 1 tablet (20 mg total) by mouth every evening., Disp: 90 tablet, Rfl: 3 .  promethazine -dextromethorphan  (PROMETHAZINE -DM) 6.25-15 MG/5ML syrup, Take 2.5 mLs by mouth 4 (four) times daily as needed for cough. (Patient not taking: Reported on 11/26/2023), Disp: 118 mL, Rfl: 0 .  TRUE METRIX BLOOD GLUCOSE TEST test strip, 1 each 3 (three) times daily., Disp: , Rfl:  .  TRUEplus Lancets 33G MISC, Apply 1 each topically 3 (three) times daily., Disp: , Rfl:  .  VENTOLIN  HFA 108 (90 Base) MCG/ACT inhaler, INHALE 1 TO 2 PUFFS INTO THE LUNGS EVERY 6 HOURS AS NEEDED FOR WHEEZING OR SHORTNESS OF BREATH, Disp: 18 g, Rfl: 1   Allergies  Allergen Reactions  . Crab [Shellfish Allergy] Shortness Of Breath and Swelling  . Erythromycin Swelling and Rash  . Hm Lidocaine  Patch [Lidocaine ]     Burning on skin  . Erdafitinib     Other reaction(s): pt not sure  . Fish Allergy     Swelling   . Metoclopramide Other (See Comments)    Slurred speech and unable to move muscles,  Caused her to drag her feet  . Sulfacetamide     Other reaction(s): itching, rash  . Sulfacetamide Sodium-Sulfur Other (See Comments)    Other  reaction(s): itching, rash  . Doxycycline      GI Intolerance  . Levaquin [Levofloxacin In D5w] Other (See Comments)    Causes irregular heart beat  . Morphine Nausea And Vomiting and Rash    Irregular heart beat  . Sulfa Antibiotics Rash     Review of Systems   There were no vitals filed for this visit.  There is no height or weight on file to calculate BMI.  Wt Readings from Last 3 Encounters:  10/17/23 187 lb 9.6 oz (85.1 kg)  10/10/23 195 lb 12.8 oz (88.8 kg)  09/13/23 195 lb 12.8 oz (88.8 kg)    The ASCVD Risk score (Arnett DK, et al., 2019) failed to calculate for the following reasons:   The valid total cholesterol range is 130 to 320 mg/dL  Objective:  Physical Exam      Assessment And Plan:  Type 2 diabetes mellitus with diabetic neuropathy, without long-term current use of insulin  (HCC)    No follow-ups on file.  Patient was given opportunity to ask questions. Patient verbalized understanding of the plan and was able to repeat key elements of the plan. All questions were answered to their satisfaction.    LILLETTE Pamela Ada, FNP, have reviewed all documentation for this visit. The documentation on 12/07/23 for the exam, diagnosis, procedures, and orders are all accurate and complete.   IF YOU HAVE BEEN REFERRED TO A SPECIALIST, IT MAY TAKE 1-2 WEEKS TO SCHEDULE/PROCESS THE REFERRAL. IF YOU HAVE NOT HEARD FROM US /SPECIALIST IN TWO WEEKS, PLEASE GIVE US  A CALL AT [phone].

## 2023-12-14 ENCOUNTER — Ambulatory Visit (INDEPENDENT_AMBULATORY_CARE_PROVIDER_SITE_OTHER): Payer: 59 | Admitting: Allergy

## 2023-12-14 ENCOUNTER — Other Ambulatory Visit: Payer: Self-pay

## 2023-12-14 ENCOUNTER — Encounter: Payer: Self-pay | Admitting: Allergy

## 2023-12-14 VITALS — BP 130/88 | HR 96 | Temp 98.7°F | Resp 20 | Ht 64.5 in | Wt 201.6 lb

## 2023-12-14 DIAGNOSIS — J329 Chronic sinusitis, unspecified: Secondary | ICD-10-CM

## 2023-12-14 DIAGNOSIS — T7800XD Anaphylactic reaction due to unspecified food, subsequent encounter: Secondary | ICD-10-CM

## 2023-12-14 DIAGNOSIS — J3089 Other allergic rhinitis: Secondary | ICD-10-CM

## 2023-12-14 DIAGNOSIS — R0781 Pleurodynia: Secondary | ICD-10-CM

## 2023-12-14 DIAGNOSIS — H1013 Acute atopic conjunctivitis, bilateral: Secondary | ICD-10-CM

## 2023-12-14 DIAGNOSIS — J4540 Moderate persistent asthma, uncomplicated: Secondary | ICD-10-CM

## 2023-12-14 DIAGNOSIS — K2100 Gastro-esophageal reflux disease with esophagitis, without bleeding: Secondary | ICD-10-CM

## 2023-12-14 DIAGNOSIS — J45998 Other asthma: Secondary | ICD-10-CM | POA: Diagnosis not present

## 2023-12-14 MED ORDER — ALBUTEROL SULFATE HFA 108 (90 BASE) MCG/ACT IN AERS: 2.0000 | INHALATION_SPRAY | RESPIRATORY_TRACT | 1 refills | Status: AC | PRN

## 2023-12-14 MED ORDER — METHYLPREDNISOLONE ACETATE 80 MG/ML IJ SUSP
80.0000 mg | Freq: Once | INTRAMUSCULAR | Status: AC
Start: 2023-12-14 — End: 2023-12-14
  Administered 2023-12-14: 80 mg via INTRAMUSCULAR

## 2023-12-14 MED ORDER — EPINEPHRINE 0.3 MG/0.3ML IJ SOAJ: 0.3000 mg | INTRAMUSCULAR | 1 refills | Status: AC | PRN

## 2023-12-14 MED ORDER — PANTOPRAZOLE SODIUM 40 MG PO TBEC: 40.0000 mg | DELAYED_RELEASE_TABLET | Freq: Every day | ORAL | 5 refills | Status: DC

## 2023-12-14 MED ORDER — AZELASTINE HCL 0.1 % NA SOLN: 1.0000 | Freq: Two times a day (BID) | NASAL | 5 refills | Status: DC | PRN

## 2023-12-14 MED ORDER — ALBUTEROL SULFATE (2.5 MG/3ML) 0.083% IN NEBU: 2.5000 mg | INHALATION_SOLUTION | RESPIRATORY_TRACT | 1 refills | Status: AC | PRN

## 2023-12-14 MED ORDER — AZELASTINE HCL 0.05 % OP SOLN: 1.0000 [drp] | Freq: Two times a day (BID) | OPHTHALMIC | 5 refills | Status: DC

## 2023-12-14 MED ORDER — CARBINOXAMINE MALEATE 4 MG PO TABS: 8.0000 mg | ORAL_TABLET | Freq: Two times a day (BID) | ORAL | 5 refills | Status: DC

## 2023-12-14 MED ORDER — MONTELUKAST SODIUM 10 MG PO TABS: 10.0000 mg | ORAL_TABLET | Freq: Every day | ORAL | 5 refills | Status: DC

## 2023-12-14 NOTE — Progress Notes (Signed)
 Follow-up Note  RE: Pamela Roberson MRN: [id number] DOB: 1956/03/19 Date of Office Visit: 12/14/2023   History of present illness: Pamela Roberson is a 68 y.o. female presenting today for follow-up of asthma, allergic rhinitis with conjunctivitis, GERD, food allergy, adverse medication allergy.  She was last seen in the office on 09/13/2023 by Dr. Tobie. Discussed the use of AI scribe software for clinical note transcription with the patient, who gave verbal consent to proceed.  She presents with persistent chest pain, reminiscent of previous experiences with pleurisy. The pain is localized around the left breast, radiating to the side. Despite a recent course of antibiotics for an upper respiratory infection diagnosed two weeks ago at an urgent care center, the pain persists but she does report she was able to breathe better.  She reports using a nebulizer for symptom management, which provides some relief but does not alleviate the pain. The nebulizer use is prompted by the chest pain and results in improved breathing but does not affect the pain. The patient also reports a dry cough accompanying the chest pain.  The Advair  was changed to symbicort  due to insurance so she has been using Symbicort  however does not have a spacer device.   In addition to the respiratory symptoms, the patient has a history of rotator cuff issues, which are currently untreated and causing discomfort. The patient also reports generalized body itching, which is exacerbated by outdoor exposure and certain medications. The patient has been managing this with antihistamines, specifically carbinoxamine , which was recently discontinued for unknown reason as she states this was very effective for her.  She does continue taking montelukast .   Review of systems: 10pt ROS negative unless noted above in HPI Past medical/social/surgical/family history have been reviewed and are unchanged unless specifically indicated  below.  No changes  Medication List: Current Outpatient Medications  Medication Sig Dispense Refill   ADVAIR  HFA 115-21 MCG/ACT inhaler Inhale 2 puffs into the lungs 2 (two) times daily. 12 g 5   amLODipine  (NORVASC ) 5 MG tablet Take 1 tablet (5 mg total) by mouth daily. 90 tablet 3   Artificial Tear Ointment (DRY EYES OP) Place 1-2 drops into both eyes daily as needed (for dry eyes).      ascorbic acid (VITAMIN C) 1000 MG tablet Take 1 tablet by mouth daily.     Blood Glucose Calibration (TRUE METRIX LEVEL 1) Low SOLN      Blood Glucose Monitoring Suppl (TRUE METRIX METER) w/Device KIT      budesonide -formoterol  (SYMBICORT ) 80-4.5 MCG/ACT inhaler Inhale 2 puffs into the lungs in the morning and at bedtime. 1 each 5   cholecalciferol (VITAMIN D3) 25 MCG (1000 UNIT) tablet Take 2,000 Units by mouth daily.     cycloSPORINE (RESTASIS) 0.05 % ophthalmic emulsion Place 1 drop into both eyes 2 times daily.     fluticasone  (FLONASE ) 50 MCG/ACT nasal spray Place 1 spray into both nostrils daily. 16 g 5   fluticasone -salmeterol (ADVAIR  HFA) 115-21 MCG/ACT inhaler Inhale 2 puffs into the lungs 2 (two) times daily. 1 each 5   metoprolol  succinate (TOPROL -XL) 25 MG 24 hr tablet Take 1 tablet (25 mg total) by mouth daily. 90 tablet 3   Multiple Vitamin (MULTIVITAMIN WITH MINERALS) TABS tablet Take 1 tablet by mouth daily.     ondansetron  (ZOFRAN -ODT) 4 MG disintegrating tablet Take 1 tablet (4 mg total) by mouth every 8 (eight) hours as needed for nausea or vomiting. 20 tablet 0  TRUE METRIX BLOOD GLUCOSE TEST test strip 1 each 3 (three) times daily.     TRUEplus Lancets 33G MISC Apply 1 each topically 3 (three) times daily.     acyclovir  (ZOVIRAX ) 400 MG tablet Take 1 tablet (400 mg total) by mouth 2 (two) times daily. (Patient not taking: Reported on 12/14/2023) 30 tablet 4   albuterol  (PROVENTIL ) (2.5 MG/3ML) 0.083% nebulizer solution Take 3 mLs (2.5 mg total) by nebulization every 4 (four) hours as  needed for wheezing or shortness of breath. 75 mL 1   albuterol  (VENTOLIN  HFA) 108 (90 Base) MCG/ACT inhaler Inhale 2 puffs into the lungs every 4 (four) hours as needed for wheezing or shortness of breath. 18 g 1   amoxicillin -clavulanate (AUGMENTIN ) 875-125 MG tablet Take 1 tablet by mouth every 12 (twelve) hours. (Patient not taking: Reported on 12/14/2023) 14 tablet 0   azelastine  (ASTELIN ) 0.1 % nasal spray Place 1 spray into both nostrils 2 (two) times daily as needed. Use in each nostril as directed 30 mL 5   azelastine  (OPTIVAR ) 0.05 % ophthalmic solution Place 1 drop into both eyes 2 (two) times daily. 6 mL 5   Carbinoxamine  Maleate 4 MG TABS Take 2 tablets (8 mg total) by mouth 2 (two) times daily. 56 tablet 5   EPINEPHrine  0.3 mg/0.3 mL IJ SOAJ injection Inject 0.3 mg into the muscle as needed for anaphylaxis. 1 each 1   montelukast  (SINGULAIR ) 10 MG tablet Take 1 tablet (10 mg total) by mouth daily. 30 tablet 5   pantoprazole  (PROTONIX ) 40 MG tablet Take 1 tablet (40 mg total) by mouth daily. 30 tablet 5   pravastatin  (PRAVACHOL ) 20 MG tablet Take 1 tablet (20 mg total) by mouth every evening. (Patient not taking: Reported on 12/14/2023) 90 tablet 3   promethazine -dextromethorphan  (PROMETHAZINE -DM) 6.25-15 MG/5ML syrup Take 2.5 mLs by mouth 4 (four) times daily as needed for cough. (Patient not taking: Reported on 12/14/2023) 118 mL 0   No current facility-administered medications for this visit.     Known medication allergies: Allergies  Allergen Reactions   Crab [Shellfish Allergy] Shortness Of Breath and Swelling   Erythromycin Swelling and Rash   Hm Lidocaine  Patch [Lidocaine ]     Burning on skin   Erdafitinib     Other reaction(s): pt not sure   Fish Allergy     Swelling    Metoclopramide Other (See Comments)    Slurred speech and unable to move muscles,  Caused her to drag her feet   Sulfacetamide     Other reaction(s): itching, rash   Sulfacetamide Sodium-Sulfur Other (See  Comments)    Other reaction(s): itching, rash   Doxycycline      GI Intolerance   Levaquin [Levofloxacin In D5w] Other (See Comments)    Causes irregular heart beat   Morphine Nausea And Vomiting and Rash    Irregular heart beat   Sulfa Antibiotics Rash     Physical examination: Blood pressure 130/88, pulse 96, temperature 98.7 F (37.1 C), temperature source Temporal, resp. rate 20, height 5' 4.5 (1.638 m), weight 201 lb 9.6 oz (91.4 kg), SpO2 96%.  General: Alert, interactive, in no acute distress. HEENT: PERRLA, TMs pearly gray, turbinates minimally edematous without discharge, post-pharynx non erythematous. Neck: Supple without lymphadenopathy. Lungs: Mildly decreased breath sounds bilaterally without wheezing, rhonchi or rales. {no increased work of breathing. CV: Normal S1, S2 without murmurs. Abdomen: Nondistended, nontender. Skin: Warm and dry, without lesions or rashes. Extremities:  No clubbing, cyanosis or edema.  Neuro:   Grossly intact.  Diagnositics/Labs:  Spirometry: FEV1: 1.72L 82%, FVC: 2.06L 76%, ratio consistent with nonobstructive pattern for age and demographic  Depo-Medrol  80 mg injection given in office  Assessment and plan:   Allergic rhinitis with conjunctivitis                                             -continue avoidance measures for grass pollen and tobacco leaf -use Carbinoxamine  4mg  - take 8mg  (2 tabs) twice daily - Use Flonase  1-2 sprays each nostril daily. Aim upward and outward. - Use Azelastine  1-2 sprays each nostril twice daily as needed for runny nose, drainage, sneezing, congestion. Aim upward and outward.  Asthma, moderate persistent Pleuritic chest pain -Not well-controlled at this time with pleuritic chest pain -continue Singulair  10mg  daily at bedtime -continue Symbicort  80mcg 2 puffs twice a day with spacer device.  -have access to albuterol  inhaler 2 puffs or albuterol  1 vial via nebulizer every 4-6 hours as needed for  cough/wheeze/shortness of breath/chest tightness.  May use 15-20 minutes prior to activity.   Monitor frequency of use.   - steroid injection given in office today to help with chest pain  Food allergy -food allergy testing both skin and blood work is negative for fish and shellfish - have access to self-injectable epinephrine  Epipen  0.3mg  at all times - follow emergency action plan in case of allergic reaction  GERD -continue lifestyle modifications for reflux control (like propping up head of bed and minimizing foods that cause heartburn) -continue pantoprazole  twice a day  -continue Pepcid  20 mg 1-2 times a day as needed  Adverse effects of medications -continue your current medication avoidance  Follow-up in 4-6 months or sooner if needed  I appreciate the opportunity to take part in Pamela Roberson's care. Please do not hesitate to contact me with questions.  Sincerely,   Danita Brain, MD Allergy/Immunology [facility]

## 2023-12-14 NOTE — Patient Instructions (Addendum)
-  continue avoidance measures for grass pollen and tobacco leaf -use Carbinoxamine  4mg  - take 8mg  (2 tabs) twice daily - Use Flonase  1-2 sprays each nostril daily. Aim upward and outward. - Use Azelastine  1-2 sprays each nostril twice daily as needed for runny nose, drainage, sneezing, congestion. Aim upward and outward.  -continue Singulair  10mg  daily at bedtime -continue Symbicort  80mcg 2 puffs twice a day with spacer device.  -have access to albuterol  inhaler 2 puffs or albuterol  1 vial via nebulizer every 4-6 hours as needed for cough/wheeze/shortness of breath/chest tightness.  May use 15-20 minutes prior to activity.   Monitor frequency of use.   - steroid injection given in office today to help with chest pain  -food allergy testing both skin and blood work is negative for fish and shellfish - have access to self-injectable epinephrine  Epipen  0.3mg  at all times - follow emergency action plan in case of allergic reaction  -continue lifestyle modifications for reflux control (like propping up head of bed and minimizing foods that cause heartburn) -continue pantoprazole  twice a day  -continue Pepcid  20 mg 1-2 times a day as needed  -continue your current medication avoidance  Follow-up in 4-6 months or sooner if needed

## 2023-12-15 ENCOUNTER — Ambulatory Visit: Payer: 59 | Admitting: Allergy

## 2023-12-22 ENCOUNTER — Telehealth (INDEPENDENT_AMBULATORY_CARE_PROVIDER_SITE_OTHER): Payer: Self-pay | Admitting: Otolaryngology

## 2023-12-22 NOTE — Telephone Encounter (Signed)
confirmed appt & location [id number] afm

## 2023-12-25 ENCOUNTER — Ambulatory Visit (INDEPENDENT_AMBULATORY_CARE_PROVIDER_SITE_OTHER): Payer: 59 | Admitting: Otolaryngology

## 2023-12-25 ENCOUNTER — Encounter (INDEPENDENT_AMBULATORY_CARE_PROVIDER_SITE_OTHER): Payer: Self-pay

## 2023-12-25 VITALS — BP 125/75 | HR 88 | Ht 66.0 in | Wt 201.0 lb

## 2023-12-25 DIAGNOSIS — J329 Chronic sinusitis, unspecified: Secondary | ICD-10-CM

## 2023-12-25 DIAGNOSIS — R0981 Nasal congestion: Secondary | ICD-10-CM | POA: Diagnosis not present

## 2023-12-25 DIAGNOSIS — J343 Hypertrophy of nasal turbinates: Secondary | ICD-10-CM | POA: Diagnosis not present

## 2023-12-25 DIAGNOSIS — J324 Chronic pansinusitis: Secondary | ICD-10-CM | POA: Insufficient documentation

## 2023-12-25 DIAGNOSIS — R040 Epistaxis: Secondary | ICD-10-CM

## 2023-12-25 MED ORDER — FLUTICASONE PROPIONATE 50 MCG/ACT NA SUSP: 2.0000 | Freq: Every day | NASAL | 9 refills | Status: DC

## 2023-12-25 NOTE — Progress Notes (Signed)
Patient ID: Pamela Roberson, female   DOB: Mar 17, 1956, 68 y.o.   MRN: [id number]  CC: Recurrent sinusitis, chronic nasal congestion, recurrent left epistaxis, chronic postnasal drainage, chronic cough  HPI:  Pamela Roberson is a 68 y.o. female who presents today with multiple medical complaints, including frequent recurrent sinusitis, chronic nasal congestion, recurrent left epistaxis, postnasal drainage, and chronic cough.  According to the patient, she has been having frequent recurrent sinusitis for several decades.  She underwent bilateral sinus surgery in the 50s in [place].  Over the past year, she has also noted to recurrent left epistaxis.  She typically has 1 or 2 bleeding episodes a month.  Her last bleeding episode was in December 2024.  She did not has any recent nasal trauma.  She complains of frequent facial pain and pressure, nasal obstruction, and nasal drainage.  She has difficulty with her sense of smell.  She was treated with multiple courses of antibiotics over the past 3 months.  She has a history of asthma, and is currently seeing an allergist.  She has been using Flonase and azelastine intermittently.  Currently she denies any fever or visual change.  Past Medical History:  Diagnosis Date   Arthritis    Asthma    Carotid stenosis, bilateral    Carpal tunnel syndrome    Chronic low back pain    COPD (chronic obstructive pulmonary disease) (HCC)    Diabetes mellitus without complication (HCC)    type II    Diabetic neuropathy (HCC)    Diverticulitis    Dyspnea    mild    Frequent falls    GERD (gastroesophageal reflux disease)    History of bronchitis    Hypercholesterolemia    Hypertension    IBS (irritable bowel syndrome)    Osteoarthritis    knee   Palpitations    Right ventricular enlargement    per pt report   Sleep apnea    mild but no sleep apnea    TMJ (dislocation of temporomandibular joint)    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     Vitamin D deficiency     Past Surgical History:  Procedure Laterality Date   ABDOMINAL HYSTERECTOMY  1993   CARPAL TUNNEL RELEASE Right    CERVICAL DISC SURGERY  04/29/2015   [place]   ESOPHAGEAL MANOMETRY N/A 03/03/2021   Procedure: ESOPHAGEAL MANOMETRY (EM);  Surgeon: Charlott Rakes, MD;  Location: [facility];  Service: Endoscopy;  Laterality: N/A;   ESOPHAGOGASTRODUODENOSCOPY ENDOSCOPY     with esophagus being stretched twice per pt,    EYE SURGERY     fatty 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      feeding tube placement and removal   2015   NASAL SEPTUM SURGERY     ROTATOR CUFF REPAIR Right    spurs removed from esophagus   2015   TONSILLECTOMY      Family History  Problem Relation Age of Onset   Allergic rhinitis Mother    Diabetes Father        type 2   Heart disease Father    Hypertension Father    Allergic rhinitis Father    Allergic rhinitis Sister    Collagen disease Sister    Heart disease Brother    Hypertension Brother    Angioedema Neg Hx    Asthma Neg Hx    Atopy Neg Hx    Eczema Neg Hx  Immunodeficiency Neg Hx    Urticaria Neg Hx     Social History:  reports that she quit smoking about 29 years ago. Her smoking use included cigarettes. She started smoking about 59 years ago. She has a 15 pack-year smoking history. She has never used smokeless tobacco. She reports that she does not drink alcohol and does not use drugs.  Allergies:  Allergies  Allergen Reactions   Crab [Shellfish Allergy] Shortness Of Breath and Swelling   Erythromycin Swelling and Rash   Hm Lidocaine Patch [Lidocaine]     Burning on skin   Erdafitinib     Other reaction(s): pt not sure   Fish Allergy     Swelling    Metoclopramide Other (See Comments)    Slurred speech and unable to move muscles,  Caused her to drag her feet   Sulfacetamide     Other reaction(s): itching, rash   Sulfacetamide Sodium-Sulfur Other (See Comments)    Other reaction(s):  itching, rash   Doxycycline     GI Intolerance   Levaquin [Levofloxacin In D5w] Other (See Comments)    Causes irregular heart beat   Morphine Nausea And Vomiting and Rash    Irregular heart beat   Sulfa Antibiotics Rash    Prior to Admission medications   Medication Sig Start Date End Date Taking? Authorizing Provider  ADVAIR HFA 115-21 MCG/ACT inhaler Inhale 2 puffs into the lungs 2 (two) times daily. 09/13/23  Yes Birder Robson, MD  albuterol (PROVENTIL) (2.5 MG/3ML) 0.083% nebulizer solution Take 3 mLs (2.5 mg total) by nebulization every 4 (four) hours as needed for wheezing or shortness of breath. 12/14/23  Yes Padgett, Pilar Grammes, MD  albuterol (VENTOLIN HFA) 108 (90 Base) MCG/ACT inhaler Inhale 2 puffs into the lungs every 4 (four) hours as needed for wheezing or shortness of breath. 12/14/23  Yes Padgett, Pilar Grammes, MD  amLODipine (NORVASC) 5 MG tablet Take 1 tablet (5 mg total) by mouth daily. 10/10/23  Yes Tobb, Kardie, DO  amoxicillin-clavulanate (AUGMENTIN) 875-125 MG tablet Take 1 tablet by mouth every 12 (twelve) hours. 11/26/23  Yes White, Elizabeth A, PA-C  Artificial Tear Ointment (DRY EYES OP) Place 1-2 drops into both eyes daily as needed (for dry eyes).    Yes [provider]  ascorbic acid (VITAMIN C) 1000 MG tablet Take 1 tablet by mouth daily.   Yes [provider]  azelastine (ASTELIN) 0.1 % nasal spray Place 1 spray into both nostrils 2 (two) times daily as needed. Use in each nostril as directed 12/14/23  Yes Padgett, Pilar Grammes, MD  azelastine (OPTIVAR) 0.05 % ophthalmic solution Place 1 drop into both eyes 2 (two) times daily. 12/14/23  Yes Padgett, Pilar Grammes, MD  Blood Glucose Calibration (TRUE METRIX LEVEL 1) Low SOLN  09/20/21  Yes [provider]  Blood Glucose Monitoring Suppl (TRUE METRIX METER) w/Device KIT  09/20/21  Yes [provider]  budesonide-formoterol (SYMBICORT) 80-4.5 MCG/ACT inhaler Inhale 2  puffs into the lungs in the morning and at bedtime. 10/23/23  Yes Birder Robson, MD  Carbinoxamine Maleate 4 MG TABS Take 2 tablets (8 mg total) by mouth 2 (two) times daily. 12/14/23  Yes Padgett, Pilar Grammes, MD  cholecalciferol (VITAMIN D3) 25 MCG (1000 UNIT) tablet Take 2,000 Units by mouth daily.   Yes [provider]  cycloSPORINE (RESTASIS) 0.05 % ophthalmic emulsion Place 1 drop into both eyes 2 times daily. 03/08/22  Yes [provider]  EPINEPHrine 0.3  mg/0.3 mL IJ SOAJ injection Inject 0.3 mg into the muscle as needed for anaphylaxis. 12/14/23  Yes Padgett, Pilar Grammes, MD  fluticasone [facility]) 50 MCG/ACT nasal spray Place 1 spray into both nostrils daily. 09/13/23  Yes Birder Robson, MD  fluticasone-salmeterol (ADVAIR HFA) 604-54 MCG/ACT inhaler Inhale 2 puffs into the lungs 2 (two) times daily. 10/24/23  Yes Padgett, Pilar Grammes, MD  metoprolol succinate (TOPROL-XL) 25 MG 24 hr tablet Take 1 tablet (25 mg total) by mouth daily. 10/10/23  Yes Tobb, Kardie, DO  montelukast (SINGULAIR) 10 MG tablet Take 1 tablet (10 mg total) by mouth daily. 12/14/23  Yes Padgett, Pilar Grammes, MD  Multiple Vitamin (MULTIVITAMIN WITH MINERALS) TABS tablet Take 1 tablet by mouth daily.   Yes [provider]  ondansetron (ZOFRAN-ODT) 4 MG disintegrating tablet Take 1 tablet (4 mg total) by mouth every 8 (eight) hours as needed for nausea or vomiting. 11/26/23  Yes White, Elizabeth A, PA-C  pantoprazole (PROTONIX) 40 MG tablet Take 1 tablet (40 mg total) by mouth daily. 12/14/23  Yes Padgett, Pilar Grammes, MD  pravastatin (PRAVACHOL) 20 MG tablet Take 1 tablet (20 mg total) by mouth every evening. 10/10/23 10/09/24 Yes Tobb, Kardie, DO  promethazine-dextromethorphan (PROMETHAZINE-DM) 6.25-15 MG/5ML syrup Take 2.5 mLs by mouth 4 (four) times daily as needed for cough. 09/28/23  Yes Radford Pax, NP  TRUE METRIX BLOOD GLUCOSE TEST test strip 1 each 3 (three) times daily.  09/20/21  Yes [provider]  TRUEplus Lancets 33G MISC Apply 1 each topically 3 (three) times daily. 09/20/21  Yes [provider]  acyclovir (ZOVIRAX) 400 MG tablet Take 1 tablet (400 mg total) by mouth 2 (two) times daily. Patient not taking: Reported on 11/26/2023 04/07/23   Meriam Sprague, MD  esomeprazole (NEXIUM) 40 MG capsule Take 1 capsule (40 mg total) by mouth daily. Patient not taking: Reported on 07/08/2020 04/24/19 08/06/20  Arby Barrette, MD    Blood pressure 125/75, pulse 88, height 5\' 6"  (1.676 m), weight 201 lb (91.2 kg), SpO2 94%. Exam: General: Communicates without difficulty, well nourished, no acute distress. Head: Normocephalic, no evidence injury, no tenderness, facial buttresses intact without stepoff. Face/sinus: No tenderness to palpation and percussion. Facial movement is normal and symmetric. Eyes: PERRL, EOMI. No scleral icterus, conjunctivae clear. Neuro: CN II exam reveals vision grossly intact.  No nystagmus at any point of gaze. Ears: Auricles well formed without lesions.  Ear canals are intact without mass or lesion.  No erythema or edema is appreciated.  The TMs are intact without fluid. Nose: External evaluation reveals normal support and skin without lesions.  Dorsum is intact.  Anterior rhinoscopy reveals congested mucosa over anterior aspect of inferior turbinates and intact septum.  No purulence or bleeding is noted. Oral:  Oral cavity and oropharynx are intact, symmetric, without erythema or edema.  Mucosa is moist without lesions. Neck: Full range of motion without pain.  There is no significant lymphadenopathy.  No masses palpable.  Thyroid bed within normal limits to palpation.  Parotid glands and submandibular glands equal bilaterally without mass.  Trachea is midline. Neuro:  CN 2-12 grossly intact.   Procedure:  Endoscopic control of recurrent left epistaxis. Indication:  Recurrent epistaxis  Description:  The left nasal cavity is  sprayed with topical xylocaine and neo-synephrine.  After adequate anesthesia is achieved, the nasal cavity is examined with a 0 rigid endoscope.  Edematous nasal mucosa is noted.  The inferior turbinate is hypertrophied.  A  suction catheter is inserted into parallel with the 0 endoscope, and it is used to suction dry blood clots from the nasal cavity.  Several hypervascular areas are noted on the anterior and superior portion of the septum. A silver nitrate stick is inserted in parallel with the 0 endoscope.  It is used to repeatedly cauterized the hypervascular areas.  Good hemostasis is achieved.  The patient tolerated the procedure well.    Assessment: 1.  Recurrent rhinosinusitis.  The patient has a history of bilateral sinus surgery in the 80s.  She was treated with multiple courses of antibiotics over the past 3 months. 2.  Diffuse nasal mucosal congestion and bilateral inferior turbinate hypertrophy.  3.  Recurrent left epistaxis.  Hypervascular areas are noted on the left anterior and superior nasal septum.  Plan: 1.  The physical exam and nasal endoscopy findings are reviewed with the patient. 2.  Endoscopic cauterization of the left nasal septum. 3.  Flonase nasal spray 2 sprays each nostril daily.  The proper technique of applying the nasal spray is demonstrated to the patient. 4.  Nasal saline irrigation is encouraged. 5.  Sinus CT scan to evaluate for possible chronic sinusitis. 6.  The patient will return for reevaluation after her sinus CT scan.  Bonniejean Piano W Lorieann Argueta 12/25/2023, 12:56 PM

## 2024-01-11 ENCOUNTER — Other Ambulatory Visit: Payer: Self-pay

## 2024-01-11 DIAGNOSIS — E1140 Type 2 diabetes mellitus with diabetic neuropathy, unspecified: Secondary | ICD-10-CM

## 2024-01-15 ENCOUNTER — Other Ambulatory Visit: Payer: 59

## 2024-01-15 DIAGNOSIS — E1140 Type 2 diabetes mellitus with diabetic neuropathy, unspecified: Secondary | ICD-10-CM | POA: Diagnosis not present

## 2024-01-16 ENCOUNTER — Other Ambulatory Visit: Payer: 59

## 2024-01-16 LAB — COMPREHENSIVE METABOLIC PANEL
AG Ratio: 1.6 (calc) (ref 1.0–2.5)
ALT: 9 U/L (ref 6–29)
AST: 10 U/L (ref 10–35)
Albumin: 3.9 g/dL (ref 3.6–5.1)
Alkaline phosphatase (APISO): 106 U/L (ref 37–153)
BUN: 10 mg/dL (ref 7–25)
CO2: 27 mmol/L (ref 20–32)
Calcium: 9.3 mg/dL (ref 8.6–10.4)
Chloride: 103 mmol/L (ref 98–110)
Creat: 0.98 mg/dL (ref 0.50–1.05)
Globulin: 2.5 g/dL (ref 1.9–3.7)
Glucose, Bld: 291 mg/dL — ABNORMAL HIGH (ref 65–99)
Potassium: 4.1 mmol/L (ref 3.5–5.3)
Sodium: 139 mmol/L (ref 135–146)
Total Bilirubin: 0.4 mg/dL (ref 0.2–1.2)
Total Protein: 6.4 g/dL (ref 6.1–8.1)

## 2024-01-16 LAB — HEMOGLOBIN A1C
Hgb A1c MFr Bld: 9.1 %{Hb} — ABNORMAL HIGH (ref <5.7)
Mean Plasma Glucose: 214 mg/dL
eAG (mmol/L): 11.9 mmol/L

## 2024-01-16 LAB — LIPID PANEL
Cholesterol: 138 mg/dL (ref <200)
HDL: 54 mg/dL (ref >=50)
LDL Cholesterol (Calc): 60 mg/dL
Non-HDL Cholesterol (Calc): 84 mg/dL (ref <130)
Total CHOL/HDL Ratio: 2.6 (calc) (ref <5.0)
Triglycerides: 158 mg/dL — ABNORMAL HIGH (ref <150)

## 2024-01-16 LAB — MICROALBUMIN / CREATININE URINE RATIO
Creatinine, Urine: 235 mg/dL (ref 20–275)
Microalb Creat Ratio: 3 mg/g{creat} (ref <30)
Microalb, Ur: 0.7 mg/dL

## 2024-01-16 LAB — C-PEPTIDE: C-Peptide: 6.48 ng/mL — ABNORMAL HIGH (ref 0.80–3.85)

## 2024-01-17 ENCOUNTER — Ambulatory Visit: Payer: 59 | Admitting: Allergy

## 2024-01-18 ENCOUNTER — Ambulatory Visit: Payer: 59 | Admitting: "Endocrinology

## 2024-01-22 DIAGNOSIS — M5416 Radiculopathy, lumbar region: Secondary | ICD-10-CM | POA: Diagnosis not present

## 2024-01-23 ENCOUNTER — Ambulatory Visit (HOSPITAL_COMMUNITY)
Admission: RE | Admit: 2024-01-23 | Discharge: 2024-01-23 | Disposition: A | Discharge status: Home / Self Care | Payer: 59 | Source: Ambulatory Visit | Attending: Otolaryngology | Admitting: Otolaryngology

## 2024-01-23 DIAGNOSIS — J329 Chronic sinusitis, unspecified: Secondary | ICD-10-CM | POA: Diagnosis not present

## 2024-01-23 DIAGNOSIS — J324 Chronic pansinusitis: Secondary | ICD-10-CM | POA: Diagnosis not present

## 2024-01-23 DIAGNOSIS — J342 Deviated nasal septum: Secondary | ICD-10-CM | POA: Diagnosis not present

## 2024-01-26 ENCOUNTER — Telehealth (INDEPENDENT_AMBULATORY_CARE_PROVIDER_SITE_OTHER): Payer: Self-pay | Admitting: Otolaryngology

## 2024-01-26 NOTE — Telephone Encounter (Signed)
Confirmed appt and address with patient for 01/29/2024

## 2024-01-29 ENCOUNTER — Ambulatory Visit (INDEPENDENT_AMBULATORY_CARE_PROVIDER_SITE_OTHER): Payer: 59

## 2024-01-29 ENCOUNTER — Encounter (INDEPENDENT_AMBULATORY_CARE_PROVIDER_SITE_OTHER): Payer: Self-pay

## 2024-01-29 VITALS — BP 135/80 | HR 85 | Ht 66.0 in | Wt 199.0 lb

## 2024-01-29 DIAGNOSIS — J320 Chronic maxillary sinusitis: Secondary | ICD-10-CM | POA: Diagnosis not present

## 2024-01-29 DIAGNOSIS — J343 Hypertrophy of nasal turbinates: Secondary | ICD-10-CM

## 2024-01-29 DIAGNOSIS — R0981 Nasal congestion: Secondary | ICD-10-CM

## 2024-01-29 DIAGNOSIS — R040 Epistaxis: Secondary | ICD-10-CM | POA: Diagnosis not present

## 2024-01-30 DIAGNOSIS — J320 Chronic maxillary sinusitis: Secondary | ICD-10-CM | POA: Insufficient documentation

## 2024-01-30 NOTE — Progress Notes (Signed)
 Patient ID: Pamela Roberson, female   DOB: September 16, 1956, 68 y.o.   MRN: [id number]  Follow-up: Chronic rhinosinusitis, chronic nasal congestion, recurrent left epistaxis  HPI: The patient is a 68 year old female who returns today for her follow-up evaluation.  The patient was last seen 1 month ago.  At that time, she was complaining of recurrent sinusitis, chronic nasal congestion, recurrent left epistaxis, and chronic postnasal drainage.  The patient has been symptomatic for several decades.  She underwent bilateral sinus surgery in the 80s.  Over the past year, she has noted more recurrent sinusitis.  She was treated with multiple courses of antibiotics over the past 3 months.  She also has frequent recurrent left epistaxis.  She was treated with endoscopic cauterization of her left nasal septum.  Her recent CT scan showed bilateral chronic maxillary sinusitis with significant mucosal disease bilaterally.  Her maxillary openings were patent.  The patient returns today complaining of persistent purulent drainage and bilateral facial pressure.  She also complains of more recurrent left epistaxis.  She has had 3 episodes of significant epistaxis over the past 2 weeks.  She is currently on Flonase nasal spray daily.  Exam: General: Communicates without difficulty, well nourished, no acute distress. Head: Normocephalic, no evidence injury, no tenderness, facial buttresses intact without stepoff. Face/sinus: No tenderness to palpation and percussion. Facial movement is normal and symmetric. Eyes: PERRL, EOMI. No scleral icterus, conjunctivae clear. Neuro: CN II exam reveals vision grossly intact.  No nystagmus at any point of gaze. Ears: Auricles well formed without lesions.  Ear canals are intact without mass or lesion.  No erythema or edema is appreciated.  The TMs are intact without fluid. Nose: External evaluation reveals normal support and skin without lesions.  Dorsum is intact.  Anterior rhinoscopy  reveals congested mucosa over anterior aspect of inferior turbinates and intact septum.  Oral:  Oral cavity and oropharynx are intact, symmetric, without erythema or edema.  Mucosa is moist without lesions. Neck: Full range of motion without pain.  There is no significant lymphadenopathy.  No masses palpable.  Thyroid bed within normal limits to palpation.  Parotid glands and submandibular glands equal bilaterally without mass.  Trachea is midline. Neuro:  CN 2-12 grossly intact.    Procedure: Repeat endoscopic control of recurrent left epistaxis. Indication:  Recurrent epistaxis  Description:  The left nasal cavity is sprayed with topical xylocaine and neo-synephrine.  After adequate anesthesia is achieved, the nasal cavity is examined with a 0 rigid endoscope.  Edematous nasal mucosa is noted.  The inferior turbinate is hypertrophied.  A suction catheter is inserted into parallel with the 0 endoscope, and it is used to suction dry blood clots from the nasal cavity.  Several hypervascular areas are again noted on the anterior and superior portion of the septum. A silver nitrate stick is inserted in parallel with the 0 endoscope.  It is used to repeatedly cauterized the hypervascular areas.  Good hemostasis is achieved.  The patient tolerated the procedure well.     Assessment: 1.  Bilateral chronic maxillary rhinosinusitis.  Significant mucosal disease is noted bilaterally on her CT scan. 2.  Diffuse nasal mucosal congestion and bilateral inferior turbinate hypertrophy.  3.  Recurrent left epistaxis.  Hypervascular areas are again noted on the left anterior and superior nasal septum.   Plan: 1.  The physical exam and nasal endoscopy findings are reviewed with the patient.  The CT images are also reviewed. 2.  Repeat endoscopic cauterization of the  left nasal septum. 3.  Medicated (mupirocin/budesonide's) nasal saline irrigation twice daily while symptomatic. 4.  Continue with Flonase nasal spray 2  sprays each nostril daily.  The proper technique of applying the nasal spray is demonstrated to the patient. 5.  Nasal saline irrigation is encouraged. 6.  The patient will return for reevaluation in 1 month.

## 2024-02-06 ENCOUNTER — Encounter: Payer: Self-pay | Admitting: Podiatry

## 2024-02-06 ENCOUNTER — Ambulatory Visit (INDEPENDENT_AMBULATORY_CARE_PROVIDER_SITE_OTHER): Payer: 59 | Admitting: Podiatry

## 2024-02-06 ENCOUNTER — Other Ambulatory Visit (INDEPENDENT_AMBULATORY_CARE_PROVIDER_SITE_OTHER): Payer: Self-pay | Admitting: Otolaryngology

## 2024-02-06 ENCOUNTER — Telehealth (INDEPENDENT_AMBULATORY_CARE_PROVIDER_SITE_OTHER): Payer: Self-pay | Admitting: Otolaryngology

## 2024-02-06 VITALS — Ht 66.0 in | Wt 199.0 lb

## 2024-02-06 DIAGNOSIS — E1142 Type 2 diabetes mellitus with diabetic polyneuropathy: Secondary | ICD-10-CM

## 2024-02-06 DIAGNOSIS — M79675 Pain in left toe(s): Secondary | ICD-10-CM | POA: Diagnosis not present

## 2024-02-06 DIAGNOSIS — B351 Tinea unguium: Secondary | ICD-10-CM | POA: Diagnosis not present

## 2024-02-06 DIAGNOSIS — L84 Corns and callosities: Secondary | ICD-10-CM | POA: Diagnosis not present

## 2024-02-06 DIAGNOSIS — M79674 Pain in right toe(s): Secondary | ICD-10-CM

## 2024-02-06 MED ORDER — AMOXICILLIN-POT CLAVULANATE 875-125 MG PO TABS: 1.0000 | ORAL_TABLET | Freq: Two times a day (BID) | ORAL | 0 refills | Status: DC

## 2024-02-06 NOTE — Telephone Encounter (Signed)
 Patient called and stated that her prescription sinus rinse was $95 and she can not  afford it. Dr. Suszanne Conners send in Augmentin 875 mg BID for 14 days [facility]  on [address] .

## 2024-02-09 NOTE — Progress Notes (Signed)
 Subjective:  Patient ID: Pamela Roberson, female    DOB: 08/30/1956,  MRN: [id number]  Pamela Roberson presents to clinic today for at risk foot care with history of diabetic neuropathy and callus(es) b/l lower extremities and painful mycotic toenails that are difficult to trim. Painful toenails interfere with ambulation. Aggravating factors include wearing enclosed shoe gear. Pain is relieved with periodic professional debridement. Painful calluses are aggravated when weightbearing with and without shoegear. Pain is relieved with periodic professional debridement.  Chief Complaint  Patient presents with   [facility]    " I have some callous that I would like her to get off, my last A1C was 9.2, PCP is [employer], and seen her a few months ago and having trouble getting her diabetic shoes"   New problem(s): None.   PCP is [name], FNP.  Allergies  Allergen Reactions   Crab [Shellfish Allergy] Shortness Of Breath and Swelling   Erythromycin Swelling and Rash   Hm Lidocaine Patch [Lidocaine]     Burning on skin   Erdafitinib     Other reaction(s): pt not sure   Fish Allergy     Swelling    Metoclopramide Other (See Comments)    Slurred speech and unable to move muscles,  Caused her to drag her feet   Sulfacetamide     Other reaction(s): itching, rash   Sulfacetamide Sodium-Sulfur Other (See Comments)    Other reaction(s): itching, rash   Doxycycline     GI Intolerance   Levaquin [Levofloxacin In D5w] Other (See Comments)    Causes irregular heart beat   Morphine Nausea And Vomiting and Rash    Irregular heart beat   Sulfa Antibiotics Rash    Review of Systems: Negative except as noted in the HPI.  Objective: No changes noted in today's physical examination. There were no vitals filed for this visit. Pamela Roberson is a pleasant 68 y.o. female in NAD. AAO x 3.   Vascular Examination: Capillary refill time immediate b/l. Vascular status intact b/l with  palpable DP pulses; faintly palpable PT pulses. Pedal hair absent b/l. No edema. No pain with calf compression b/l. Skin temperature gradient WNL b/l. No cyanosis or clubbing noted b/l. No ischemia or gangrene b/l.   Neurological Examination: Sensation grossly intact b/l with 10 gram monofilament. Vibratory sensation intact b/l. Pt has subjective symptoms of neuropathy.  Dermatological Examination: Pedal skin with normal turgor, texture and tone b/l.  No open wounds. No interdigital macerations.   Toenails 1-5 b/l thick, discolored, elongated with subungual debris and pain on dorsal palpation.   Hyperkeratotic lesion(s) submet head 1 left foot, submet head 5 b/l, and sub 5th met base right foot.  No erythema, no edema, no drainage, no fluctuance.  Musculoskeletal Examination: Muscle strength 5/5 to all lower extremity muscle groups bilaterally. Current pair of Crocs type mules are worn on the sole. No pain, crepitus or joint limitation noted with ROM bilateral LE. 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 Antalgic gait noted.  Radiographs: None  Assessment/Plan: 1. Pain due to onychomycosis of toenails of both feet   2. Callus   3. Diabetic peripheral neuropathy associated with type 2 diabetes mellitus (HCC)    Patient was evaluated and treated. All patient's and/or POA's questions/concerns addressed on today's visit. Mycotic toenails 1-5 debrided in length and girth without incident. Callus(es) submet head 1 left foot, submet head 5 b/l, and sub 5th met base right foot  pared with sharp debridement without incident. Continue daily foot inspections and monitor blood glucose per PCP/Endocrinologist's recommendations. Continue soft, supportive shoe gear daily. Report any pedal injuries to medical professional. -Patient updated on status of diabetic shoes. Certifying Form from PCP/Endocrinologist is not on file. We cannot order/dispense shoes without this  completed form. Patient notified to contact physician treating diabetes to obtain signed paperwork. -Patient/POA to call should there be question/concern in the interim. Call office if there are any questions/concerns. -Patient/POA to call should there be question/concern in the interim.   Return in about 3 months (around 05/08/2024).  Freddie Breech, DPM      [place] LOCATION: [address], [place] [address]                   Office [phone]   [facility] LOCATION: [address] [place], [place] [address] Office [phone]

## 2024-02-13 ENCOUNTER — Ambulatory Visit: Admitting: Nurse Practitioner

## 2024-02-13 ENCOUNTER — Encounter: Payer: Self-pay | Admitting: Nurse Practitioner

## 2024-02-13 VITALS — BP 130/76 | HR 109 | Temp 99.0°F | Ht 66.0 in | Wt 208.6 lb

## 2024-02-13 DIAGNOSIS — E66811 Obesity, class 1: Secondary | ICD-10-CM

## 2024-02-13 DIAGNOSIS — E1140 Type 2 diabetes mellitus with diabetic neuropathy, unspecified: Secondary | ICD-10-CM | POA: Diagnosis not present

## 2024-02-13 DIAGNOSIS — Z6833 Body mass index (BMI) 33.0-33.9, adult: Secondary | ICD-10-CM

## 2024-02-13 DIAGNOSIS — I10 Essential (primary) hypertension: Secondary | ICD-10-CM | POA: Diagnosis not present

## 2024-02-13 DIAGNOSIS — Z2821 Immunization not carried out because of patient refusal: Secondary | ICD-10-CM | POA: Diagnosis not present

## 2024-02-13 DIAGNOSIS — E6609 Other obesity due to excess calories: Secondary | ICD-10-CM

## 2024-02-13 MED ORDER — TRUE METRIX BLOOD GLUCOSE TEST VI STRP: 1.0000 | ORAL_STRIP | Freq: Three times a day (TID) | 3 refills | Status: DC

## 2024-02-13 MED ORDER — FLUCONAZOLE 100 MG PO TABS
100.0000 mg | ORAL_TABLET | Freq: Every day | ORAL | 0 refills | Status: DC
Start: 2024-02-13 — End: 2024-03-14

## 2024-02-13 MED ORDER — ACYCLOVIR 400 MG PO TABS
400.0000 mg | ORAL_TABLET | Freq: Two times a day (BID) | ORAL | 4 refills | Status: DC
  Filled 2024-06-04: qty 30, 15d supply, fill #0
  Filled 2024-06-13 – 2024-06-17 (×2): qty 30, 15d supply, fill #1

## 2024-02-13 MED ORDER — TRUEPLUS LANCETS 33G MISC: 1.0000 | Freq: Three times a day (TID) | 3 refills | Status: DC

## 2024-02-13 MED ORDER — MOUNJARO 2.5 MG/0.5ML ~~LOC~~ SOAJ: 2.5000 mg | SUBCUTANEOUS | 0 refills | Status: DC

## 2024-02-13 NOTE — Assessment & Plan Note (Signed)
 Blood pressure is controlled. Continue current medications.

## 2024-02-13 NOTE — Assessment & Plan Note (Signed)
 Declines shingrix, educated on disease process and is aware if he changes his mind to notify office

## 2024-02-13 NOTE — Assessment & Plan Note (Signed)

## 2024-02-13 NOTE — Assessment & Plan Note (Signed)
 She has not seen the Endocrinologist in person at this time has an appt April 10th. Her A1c was 9.2 and she continues to not take any medication due to her saying the jardiance caused her side effects, Ozempic caused her a rash to her back, unable to tolerate metformin. I have expressed to her the importance of having to take a medication or she will end up on insulin. I have sent a Rx for [facility] and given a sample in office. MA demonstrated however she did not want to start today. Diabetic foot exam done. Will call podiatry office for diabetic shoes form.

## 2024-02-13 NOTE — Progress Notes (Signed)
 Pamela Roberson, CMA,acting as a Neurosurgeon for Pamela Felts, FNP.,have documented all relevant documentation on the behalf of Pamela Felts, FNP,as directed by  Pamela Felts, FNP while in the presence of Pamela Felts, FNP.  Subjective:  Patient ID: Pamela Roberson , female    DOB: 1956/07/25 , 68 y.o.   MRN: [id number]  Chief Complaint  Patient presents with   Diabetes    HPI  Patient presents today for a dm follow up, Patient reports compliance with medication. Patient denies any chest pain, SOB, or headaches.   Patient would also like to be evaluated for diabetic shoes. She has She is established with a podiatry. Patient reports she is being treated for a sinus infection by Dr .Suszanne Conners, she reports she thinks the antibiotic has giving her a yeast infection.      Past Medical History:  Diagnosis Date   Arthritis    Asthma    Carotid stenosis, bilateral    Carpal tunnel syndrome    Chronic low back pain    COPD (chronic obstructive pulmonary disease) (HCC)    Diabetes mellitus without complication (HCC)    type II    Diabetic neuropathy (HCC)    Diverticulitis    Dyspnea    mild    Frequent falls    GERD (gastroesophageal reflux disease)    History of bronchitis    Hypercholesterolemia    Hypertension    IBS (irritable bowel syndrome)    Osteoarthritis    knee   Palpitations    Right ventricular enlargement    per pt report   Sleep apnea    mild but no sleep apnea    TMJ (dislocation of temporomandibular joint)    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    Vitamin D deficiency      Family History  Problem Relation Age of Onset   Allergic rhinitis Mother    Diabetes Father        type 2   Heart disease Father    Hypertension Father    Allergic rhinitis Father    Allergic rhinitis Sister    Collagen disease Sister    Heart disease Brother    Hypertension Brother    Angioedema Neg Hx    Asthma Neg Hx    Atopy Neg Hx    Eczema Neg Hx    Immunodeficiency Neg  Hx    Urticaria Neg Hx      Current Outpatient Medications:    ADVAIR HFA 115-21 MCG/ACT inhaler, Inhale 2 puffs into the lungs 2 (two) times daily., Disp: 12 g, Rfl: 5   albuterol (PROVENTIL) (2.5 MG/3ML) 0.083% nebulizer solution, Take 3 mLs (2.5 mg total) by nebulization every 4 (four) hours as needed for wheezing or shortness of breath., Disp: 75 mL, Rfl: 1   albuterol (VENTOLIN HFA) 108 (90 Base) MCG/ACT inhaler, Inhale 2 puffs into the lungs every 4 (four) hours as needed for wheezing or shortness of breath., Disp: 18 g, Rfl: 1   amLODipine (NORVASC) 5 MG tablet, Take 1 tablet (5 mg total) by mouth daily., Disp: 90 tablet, Rfl: 3   amoxicillin-clavulanate (AUGMENTIN) 875-125 MG tablet, Take 1 tablet by mouth 2 (two) times daily for 14 days., Disp: 28 tablet, Rfl: 0   Artificial Tear Ointment (DRY EYES OP), Place 1-2 drops into both eyes daily as needed (for dry eyes). , Disp: , Rfl:    ascorbic acid (VITAMIN C) 1000 MG tablet, Take 1 tablet by mouth  daily., Disp: , Rfl:    azelastine (OPTIVAR) 0.05 % ophthalmic solution, Place 1 drop into both eyes 2 (two) times daily., Disp: 6 mL, Rfl: 5   Blood Glucose Calibration (TRUE METRIX LEVEL 1) Low SOLN, , Disp: , Rfl:    Blood Glucose Monitoring Suppl (TRUE METRIX METER) w/Device KIT, , Disp: , Rfl:    budesonide-formoterol (SYMBICORT) 80-4.5 MCG/ACT inhaler, Inhale 2 puffs into the lungs in the morning and at bedtime., Disp: 1 each, Rfl: 5   Carbinoxamine Maleate 4 MG TABS, Take 2 tablets (8 mg total) by mouth 2 (two) times daily., Disp: 56 tablet, Rfl: 5   cholecalciferol (VITAMIN D3) 25 MCG (1000 UNIT) tablet, Take 2,000 Units by mouth daily., Disp: , Rfl:    cycloSPORINE (RESTASIS) 0.05 % ophthalmic emulsion, Place 1 drop into both eyes 2 times daily., Disp: , Rfl:    EPINEPHrine 0.3 mg/0.3 mL IJ SOAJ injection, Inject 0.3 mg into the muscle as needed for anaphylaxis., Disp: 1 each, Rfl: 1   fluconazole (DIFLUCAN) 100 MG tablet, Take 1  tablet (100 mg total) by mouth daily. Take 1 tablet by mouth now repeat in 5 days, Disp: 2 tablet, Rfl: 0   fluticasone (FLONASE) 50 MCG/ACT nasal spray, Place 1 spray into both nostrils daily., Disp: 16 g, Rfl: 5   fluticasone (FLONASE) 50 MCG/ACT nasal spray, Place 2 sprays into both nostrils daily., Disp: 1 g, Rfl: 9   fluticasone-salmeterol (ADVAIR HFA) 115-21 MCG/ACT inhaler, Inhale 2 puffs into the lungs 2 (two) times daily., Disp: 1 each, Rfl: 5   metoprolol succinate (TOPROL-XL) 25 MG 24 hr tablet, Take 1 tablet (25 mg total) by mouth daily., Disp: 90 tablet, Rfl: 3   montelukast (SINGULAIR) 10 MG tablet, Take 1 tablet (10 mg total) by mouth daily., Disp: 30 tablet, Rfl: 5   Multiple Vitamin (MULTIVITAMIN WITH MINERALS) TABS tablet, Take 1 tablet by mouth daily., Disp: , Rfl:    ondansetron (ZOFRAN-ODT) 4 MG disintegrating tablet, Take 1 tablet (4 mg total) by mouth every 8 (eight) hours as needed for nausea or vomiting., Disp: 20 tablet, Rfl: 0   pantoprazole (PROTONIX) 40 MG tablet, Take 1 tablet (40 mg total) by mouth daily., Disp: 30 tablet, Rfl: 5   pravastatin (PRAVACHOL) 20 MG tablet, Take 1 tablet (20 mg total) by mouth every evening., Disp: 90 tablet, Rfl: 3   tirzepatide (MOUNJARO) 2.5 MG/0.5ML Pen, Inject 2.5 mg into the skin once a week., Disp: 2 mL, Rfl: 0   acyclovir (ZOVIRAX) 400 MG tablet, Take 1 tablet (400 mg total) by mouth 2 (two) times daily., Disp: 30 tablet, Rfl: 4   TRUE METRIX BLOOD GLUCOSE TEST test strip, 1 each by Other route 3 (three) times daily., Disp: 100 each, Rfl: 3   TRUEplus Lancets 33G MISC, Apply 1 each topically 3 (three) times daily., Disp: 100 each, Rfl: 3   Allergies  Allergen Reactions   Crab [Shellfish Allergy] Shortness Of Breath and Swelling   Erythromycin Swelling and Rash   Hm Lidocaine Patch [Lidocaine]     Burning on skin   Erdafitinib     Other reaction(s): pt not sure   Fish Allergy     Swelling    Metoclopramide Other (See  Comments)    Slurred speech and unable to move muscles,  Caused her to drag her feet   Sulfacetamide     Other reaction(s): itching, rash   Sulfacetamide Sodium-Sulfur Other (See Comments)    Other reaction(s): itching, rash  Doxycycline     GI Intolerance   Levaquin [Levofloxacin In D5w] Other (See Comments)    Causes irregular heart beat   Morphine Nausea And Vomiting and Rash    Irregular heart beat   Sulfa Antibiotics Rash     Review of Systems  Constitutional: Negative.   HENT: Negative.    Eyes: Negative.   Respiratory: Negative.    Cardiovascular: Negative.   Gastrointestinal: Negative.   Endocrine: Negative.   Genitourinary: Negative.   Musculoskeletal: Negative.   Skin: Negative.   Allergic/Immunologic: Negative.   Neurological: Negative.   Hematological: Negative.   Psychiatric/Behavioral: Negative.       Today's Vitals   02/13/24 0944  BP: 130/76  Pulse: (!) 109  Temp: 99 F (37.2 C)  TempSrc: Oral  Weight: 208 lb 9.6 oz (94.6 kg)  Height: 5\' 6"  (1.676 m)  PainSc: 7   PainLoc: Knee   Body mass index is 33.67 kg/m.  Wt Readings from Last 3 Encounters:  02/13/24 208 lb 9.6 oz (94.6 kg)  02/06/24 199 lb (90.3 kg)  01/29/24 199 lb (90.3 kg)     Objective:  Physical Exam Vitals reviewed.  Constitutional:      General: She is not in acute distress.    Appearance: Normal appearance. She is obese.  Cardiovascular:     Rate and Rhythm: Normal rate and regular rhythm.     Pulses: Normal pulses.     Heart sounds: Normal heart sounds. No murmur heard. Pulmonary:     Effort: Pulmonary effort is normal. No respiratory distress.     Breath sounds: Normal breath sounds. No wheezing.  Skin:    General: Skin is warm.     Capillary Refill: Capillary refill takes less than 2 seconds.  Neurological:     General: No focal deficit present.     Mental Status: She is alert and oriented to person, place, and time.     Cranial Nerves: No cranial nerve deficit.      Motor: No weakness.  Psychiatric:        Mood and Affect: Mood normal.        Behavior: Behavior normal.        Thought Content: Thought content normal.        Judgment: Judgment normal.      Title   Diabetic Foot Exam - detailed Date & Time: 02/13/2024 12:40 PM Diabetic Foot exam was performed with the following findings: Yes  Visual Foot Exam completed.: Yes  Is there a history of foot ulcer?: No Is there a foot ulcer now?: No Is there swelling?: No Is there elevated skin temperature?: No Is there abnormal foot shape?: No Is there a claw toe deformity?: No Are the toenails long?: No Are the toenails thick?: Yes Are the toenails ingrown?: No Is the skin thin, fragile, shiny and hairless?": No Normal Range of Motion?: Yes Is there foot or ankle muscle weakness?: No Do you have pain in calf while walking?: No Are the shoes appropriate in style and fit?: Yes Can the patient see the bottom of their feet?: Yes Pulse Foot Exam completed.: Yes   Right Posterior Tibialis: Diminished Left posterior Tibialis: Present   Right Dorsalis Pedis: Diminished Left Dorsalis Pedis: Present     Sensory Foot Exam Completed.: Yes Semmes-Weinstein Monofilament Test "+" means "has sensation" and "-" means "no sensation"   R Site 1-Great Toe: Pos L Site 1-Great Toe: Pos   R Site 4: Pos L Site 4: Pos  R Site 6: Pos L Site 6: Pos     Image components are not supported.   Image components are not supported. Image components are not supported.  [place] Comments Diminished sensation to bilateral feet        Assessment And Plan:  Type 2 diabetes mellitus with diabetic neuropathy, without long-term current use of insulin (HCC) Assessment & Plan: She has not seen the Endocrinologist in person at this time has an appt April 10th. Her A1c was 9.2 and she continues to not take any medication due to her saying the jardiance caused her side effects, Ozempic caused her a rash to her back,  unable to tolerate metformin. I have expressed to her the importance of having to take a medication or she will end up on insulin. I have sent a Rx for [facility] and given a sample in office. MA demonstrated however she did not want to start today. Diabetic foot exam done. Will call podiatry office for diabetic shoes form.   Orders: -     Mounjaro; Inject 2.5 mg into the skin once a week.  Dispense: 2 mL; Refill: 0 -     True Metrix Blood Glucose Test; 1 each by Other route 3 (three) times daily.  Dispense: 100 each; Refill: 3 -     TRUEplus Lancets 33G; Apply 1 each topically 3 (three) times daily.  Dispense: 100 each; Refill: 3  Primary hypertension Assessment & Plan: Blood pressure is controlled. Continue current medications   Diabetes mellitus with coincident hypertension (HCC)  COVID-19 vaccination declined Assessment & Plan: Declines covid 19 vaccine. Discussed risk of covid 35 and if she changes her mind about the vaccine to call the office. Education has been provided regarding the importance of this vaccine but patient still declined. Advised may receive this vaccine at local pharmacy or Health Dept.or vaccine clinic. Aware to provide a copy of the vaccination record if obtained from local pharmacy or Health Dept.  Encouraged to take multivitamin, vitamin d, vitamin c and zinc to increase immune system. Aware can call office if would like to have vaccine here at office. Verbalized acceptance and understanding.    Herpes zoster vaccination declined Assessment & Plan: Declines shingrix, educated on disease process and is aware if he changes his mind to notify office    Class 1 obesity due to excess calories with body mass index (BMI) of 33.0 to 33.9 in adult, unspecified whether serious comorbidity present  Other orders -     Acyclovir; Take 1 tablet (400 mg total) by mouth 2 (two) times daily.  Dispense: 30 tablet; Refill: 4 -     Fluconazole; Take 1 tablet (100 mg total) by  mouth daily. Take 1 tablet by mouth now repeat in 5 days  Dispense: 2 tablet; Refill: 0   Return for controlled DM check 4 months.   Patient was given opportunity to ask questions. Patient verbalized understanding of the plan and was able to repeat key elements of the plan. All questions were answered to their satisfaction.    Pamela Sparrow, FNP, have reviewed all documentation for this visit. The documentation on 02/13/24 for the exam, diagnosis, procedures, and orders are all accurate and complete.   IF YOU HAVE BEEN REFERRED TO A SPECIALIST, IT MAY TAKE 1-2 WEEKS TO SCHEDULE/PROCESS THE REFERRAL. IF YOU HAVE NOT HEARD FROM US/SPECIALIST IN TWO WEEKS, PLEASE GIVE [place] A CALL AT [phone].

## 2024-02-20 ENCOUNTER — Telehealth: Payer: Self-pay | Admitting: Pharmacist

## 2024-02-20 DIAGNOSIS — E119 Type 2 diabetes mellitus without complications: Secondary | ICD-10-CM

## 2024-02-20 NOTE — Progress Notes (Unsigned)
 02/20/2024 Name: Pamela Roberson MRN: [id number] DOB: 03-10-1956  Chief Complaint  Patient presents with   Medication Management    Diabetes     Pamela Roberson is a 68 y.o. year old female who presented for a telephone visit.   They were referred to the pharmacist by a quality report for assistance in managing diabetes.    Subjective:  Care Team: Primary Care Provider: [name], FNP ; Next Scheduled Visit: 08/22/2024   Medication Access/Adherence  Current Pharmacy:  [id number] - [place], [place] - [address] AT [facility] OF [address] [place] [address] Phone: [phone] Fax: [phone]   Patient reports affordability concerns with their medications: {YES/NO:21197} Patient reports access/transportation concerns to their pharmacy: No  Patient reports adherence concerns with their medications:  No    Reported extreme nausea and diarrhea with Ozmepic.  Said she also had large welps all over her back and a hot flushing feeling when she took Ozempic.  Is now on Mounjaro.  Reported feeling nauseated but not like with Ozempic.   Diabetes:  Mounjaro   Current glucose readings: Intereted in CGM needs new BGM  Patient denies hypoglycemic s/sx including  dizziness, shakiness, sweating. Patient denies hyperglycemic symptoms including  polyuria, polydipsia, polyphagia, nocturia, neuropathy, blurred vision.  Current meal patterns:  - Breakfast: boiled egg, oatmeal, banannas - Lunch burger homemade, sweet potato fries,  - Supper Puff-n-corns (seedless popcorn with cheese), tomato, lettuce, dressing - Snacks corn chips, puff-in-corns,strawberries, bananna - Drinks tea with splenda, water,  Current physical activity: walk, gardening,   Current medication access support: Meds are mostly $0  Hypertension:  Current medications:  Amlodpine 5mg    Hyperlipidemia/ASCVD Risk Reduction Pravastatin 20  mg    Objective:  Lab Results  Component Value Date   HGBA1C 9.1 (H) 01/15/2024    Lab Results  Component Value Date   CREATININE 0.98 01/15/2024   BUN 10 01/15/2024   NA 139 01/15/2024   K 4.1 01/15/2024   CL 103 01/15/2024   CO2 27 01/15/2024    Lab Results  Component Value Date   CHOL 138 01/15/2024   HDL 54 01/15/2024   LDLCALC 60 01/15/2024   TRIG 158 (H) 01/15/2024   CHOLHDL 2.6 01/15/2024    Medications Reviewed Today     Reviewed by Beecher Mcardle, RPH (Pharmacist) on 02/20/24 at 1535  Med List Status: <None>   Medication Order Taking? Sig Documenting Provider Last Dose Status Informant  acyclovir (ZOVIRAX) 400 MG tablet [id number] Yes Take 1 tablet (400 mg total) by mouth 2 (two) times daily. [name], FNP Taking Active   albuterol (PROVENTIL) (2.5 MG/3ML) 0.083% nebulizer solution [id number] Yes Take 3 mLs (2.5 mg total) by nebulization every 4 (four) hours as needed for wheezing or shortness of breath. Marcelyn Bruins, MD Taking Active   albuterol (VENTOLIN HFA) 108 [phone] Base) MCG/ACT inhaler [id number] Yes Inhale 2 puffs into the lungs every 4 (four) hours as needed for wheezing or shortness of breath. Marcelyn Bruins, MD Taking Active   amLODipine (NORVASC) 5 MG tablet [id number] Yes Take 1 tablet (5 mg total) by mouth daily. Thomasene Ripple, DO Taking Active   Artificial Tear Ointment (DRY EYES OP) [id number] Yes Place 1-2 drops into both eyes daily as needed (for dry eyes).  [provider] Taking Active Self  ascorbic acid (VITAMIN C) 1000 MG tablet [id number] Yes Take 1 tablet by mouth daily. [provider] Taking Active   azelastine (OPTIVAR) 0.05 % ophthalmic solution [id number] Yes Place 1 drop into both eyes 2 (two) times daily. Marcelyn Bruins, MD Taking Active   Blood Glucose Calibration (TRUE METRIX LEVEL 1) Low SOLN [id number] Yes  [provider] Taking Active   Blood Glucose Monitoring Suppl (TRUE  METRIX METER) w/Device KIT [id number] Yes  [provider] Taking Active   budesonide-formoterol (SYMBICORT) 80-4.5 MCG/ACT inhaler [id number] Yes Inhale 2 puffs into the lungs in the morning and at bedtime. Birder Robson, MD Taking Active   Carbinoxamine Maleate 4 MG TABS [id number] Yes Take 2 tablets (8 mg total) by mouth 2 (two) times daily. Marcelyn Bruins, MD Taking Active   cholecalciferol (VITAMIN D3) 25 MCG (1000 UNIT) tablet [id number] Yes Take 2,000 Units by mouth daily. [provider] Taking Active Self  cycloSPORINE (RESTASIS) 0.05 % ophthalmic emulsion [id number] Yes Place 1 drop into both eyes 2 times daily. [provider] Taking Active   EPINEPHrine 0.3 mg/0.3 mL IJ SOAJ injection [id number] Yes Inject 0.3 mg into the muscle as needed for anaphylaxis. Marcelyn Bruins, MD Taking Active    Patient not taking:   Discontinued 08/06/20 2039 famotidine (PEPCID) 20 MG tablet [id number] Yes Take 20 mg by mouth 2 (two) times daily. [provider] Taking Active   fluconazole (DIFLUCAN) 100 MG tablet [id number] Yes Take 1 tablet (100 mg total) by mouth daily. Take 1 tablet by mouth now repeat in 5 days [name], FNP Taking Active   fluticasone (FLONASE) 50 MCG/ACT nasal spray [id number] Yes Place 2 sprays into both nostrils daily. Newman Pies, MD Taking Active   metoprolol succinate (TOPROL-XL) 25 MG 24 hr tablet [id number] Yes Take 1 tablet (25 mg total) by mouth daily. Tobb, Kardie, DO Taking Active   montelukast (SINGULAIR) 10 MG tablet [id number] Yes Take 1 tablet (10 mg total) by mouth daily. Marcelyn Bruins, MD Taking Active   Multiple Vitamin (MULTIVITAMIN WITH MINERALS) TABS tablet [id number] Yes Take 1 tablet by mouth daily. [provider] Taking Active Self  ondansetron (ZOFRAN-ODT) 4 MG disintegrating tablet [id number] Yes Take 1 tablet (4 mg total) by mouth every 8 (eight) hours as needed for nausea or vomiting. White,  Elizabeth A, PA-C Taking Active   pantoprazole (PROTONIX) 40 MG tablet [id number] Yes Take 1 tablet (40 mg total) by mouth daily. Marcelyn Bruins, MD Taking Active   pravastatin (PRAVACHOL) 20 MG tablet [id number] Yes Take 1 tablet (20 mg total) by mouth every evening. Thomasene Ripple, DO Taking Active   tirzepatide [facility]) 2.5 MG/0.5ML Pen [id number] Yes Inject 2.5 mg into the skin once a week. [name], FNP Taking Active   TRUE METRIX BLOOD GLUCOSE TEST test strip [id number] Yes 1 each by Other route 3 (three) times daily. [name], FNP Taking Active   TRUEplus Lancets 33G MISC [id number] Yes Apply 1 each topically 3 (three) times daily. [name], FNP Taking Active               Assessment/Plan:   {Pharmacy A/P Choices:26421}  Follow Up Plan: ***  ***

## 2024-02-21 ENCOUNTER — Telehealth: Payer: Self-pay

## 2024-02-21 NOTE — Telephone Encounter (Signed)
 Patient was identified as falling into the [place] Measure - Diabetes.   Patient had A1C done 01/15/2024

## 2024-02-24 ENCOUNTER — Encounter: Payer: Self-pay | Admitting: *Deleted

## 2024-02-24 ENCOUNTER — Ambulatory Visit
Admission: EM | Admit: 2024-02-24 | Discharge: 2024-02-24 | Disposition: A | Discharge status: Home / Self Care | Attending: Internal Medicine | Admitting: Internal Medicine

## 2024-02-24 ENCOUNTER — Other Ambulatory Visit: Payer: Self-pay

## 2024-02-24 DIAGNOSIS — J3489 Other specified disorders of nose and nasal sinuses: Secondary | ICD-10-CM

## 2024-02-24 MED ORDER — METHYLPREDNISOLONE ACETATE 80 MG/ML IJ SUSP
80.0000 mg | Freq: Once | INTRAMUSCULAR | Status: AC
  Administered 2024-02-24: 80 mg via INTRAMUSCULAR

## 2024-02-24 NOTE — ED Triage Notes (Signed)
 She states she has had a sinus infection "for months". Facial pain. She has been seen by ENT and had surgery on her nose (scraped) and the left side stills bleeds. She doesn't feel any better

## 2024-02-24 NOTE — Discharge Instructions (Signed)
 You were given a steroid shot to help decrease sinus pressure.  Follow-up with ENT and primary care doctor.

## 2024-02-24 NOTE — ED Provider Notes (Signed)
 [facility]    CSN: [id number] Arrival date & time: 02/24/24  1508      History   Chief Complaint Chief Complaint  Patient presents with   Facial Pain    HPI Pamela Roberson is a 68 y.o. female.   Patient presents with persistent nasal congestion with sinus pressure and pain.  Patient has a history of recurrent sinus infections where she is followed by ENT.  Patient just completed a course of antibiotics with minimal improvement.  She reports that she has intermittent nosebleeds as well where she just had a procedure to attempt to fix this.  Reports that it seems that her nosebleeds became more frequent after the procedure.  She has not yet contacted her ENT for this.     Past Medical History:  Diagnosis Date   Arthritis    Asthma    Carotid stenosis, bilateral    Carpal tunnel syndrome    Chronic low back pain    COPD (chronic obstructive pulmonary disease) (HCC)    Diabetes mellitus without complication (HCC)    type II    Diabetic neuropathy (HCC)    Diverticulitis    Dyspnea    mild    Frequent falls    GERD (gastroesophageal reflux disease)    History of bronchitis    Hypercholesterolemia    Hypertension    IBS (irritable bowel syndrome)    Osteoarthritis    knee   Palpitations    Right ventricular enlargement    per pt report   Sleep apnea    mild but no sleep apnea    TMJ (dislocation of temporomandibular joint)    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    Vitamin D deficiency     Patient Active Problem List   Diagnosis Date Noted   Primary hypertension 02/13/2024   Diabetes mellitus with coincident hypertension (HCC) 02/13/2024   Class 1 obesity due to excess calories with body mass index (BMI) of 33.0 to 33.9 in adult 02/13/2024   Chronic maxillary sinusitis 01/30/2024   Epistaxis 12/25/2023   Hypertrophy of nasal turbinates 12/25/2023   COVID-19 vaccination declined 10/24/2023   Need for influenza vaccination 10/24/2023    Class 1 obesity without serious comorbidity with body mass index (BMI) of 30.0 to 30.9 in adult 10/24/2023   Encounter for annual health examination 08/31/2023   Type 2 diabetes mellitus with diabetic neuropathy, without long-term current use of insulin (HCC) 08/31/2023   Estrogen deficiency 08/31/2023   Herpes zoster vaccination declined 08/31/2023   Influenza vaccination declined 08/31/2023   Post-nasal drip 08/31/2023   Muscle weakness (generalized) 08/31/2023   Degeneration of lumbar intervertebral disc 07/26/2023   Diabetic peripheral neuropathy (HCC) 07/26/2023   Left leg weakness 07/26/2023   Lumbar radiculopathy 07/26/2023   Pain in left knee 02/15/2022   Sciatica, left side 02/15/2022   Pain in left shoulder 11/16/2021   Internal hemorrhoids 11/03/2021   Leg cramps 11/03/2021   Myalgia 11/03/2021   Adenomatous polyp of colon 12/04/2020   Anal fissure 12/04/2020   Chronic idiopathic constipation 12/04/2020   Constipation 12/04/2020   Dysphagia 12/04/2020   Gastroesophageal reflux disease 12/04/2020   Rectal pain 12/04/2020   Right upper quadrant pain 12/04/2020   Dizzy 12/11/2017   Pharyngeal dysphagia 12/11/2017   Referred ear pain, bilateral 12/11/2017   Diabetes mellitus without complication (HCC) 06/29/2017   Family history of glaucoma 06/29/2017   Hyperopia of both eyes with astigmatism and presbyopia 06/29/2017  Keratoconjunctivitis sicca of both eyes not specified as Sjogren's 06/29/2017   Nuclear sclerotic cataract of both eyes 06/29/2017   Vitreous floater, bilateral 06/29/2017   Biceps tendinitis of right shoulder 04/05/2017   Impingement syndrome of right shoulder 04/05/2017   Arthritis of right acromioclavicular joint 04/05/2017   Partial nontraumatic tear of rotator cuff, right 04/05/2017    Past Surgical History:  Procedure Laterality Date   ABDOMINAL HYSTERECTOMY  1993   CARPAL TUNNEL RELEASE Right    CERVICAL DISC SURGERY  04/29/2015   [place]  [place]   ESOPHAGEAL MANOMETRY N/A 03/03/2021   Procedure: ESOPHAGEAL MANOMETRY (EM);  Surgeon: Charlott Rakes, MD;  Location: [facility];  Service: Endoscopy;  Laterality: N/A;   ESOPHAGOGASTRODUODENOSCOPY ENDOSCOPY     with esophagus being stretched twice per pt,    EYE SURGERY     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      Home Medications    Prior to Admission medications   Medication Sig Start Date End Date Taking? Authorizing Provider  acyclovir (ZOVIRAX) 400 MG tablet Take 1 tablet (400 mg total) by mouth 2 (two) times daily. 02/13/24   [name], FNP  albuterol (PROVENTIL) (2.5 MG/3ML) 0.083% nebulizer solution Take 3 mLs (2.5 mg total) by nebulization every 4 (four) hours as needed for wheezing or shortness of breath. 12/14/23   Marcelyn Bruins, MD  albuterol (VENTOLIN HFA) 108 (90 Base) MCG/ACT inhaler Inhale 2 puffs into the lungs every 4 (four) hours as needed for wheezing or shortness of breath. 12/14/23   Marcelyn Bruins, MD  amLODipine (NORVASC) 5 MG tablet Take 1 tablet (5 mg total) by mouth daily. 10/10/23   Tobb, Kardie, DO  Artificial Tear Ointment (DRY EYES OP) Place 1-2 drops into both eyes daily as needed (for dry eyes).     [provider]  ascorbic acid (VITAMIN C) 1000 MG tablet Take 1 tablet by mouth daily.    [provider]  azelastine (OPTIVAR) 0.05 % ophthalmic solution Place 1 drop into both eyes 2 (two) times daily. 12/14/23   Marcelyn Bruins, MD  Blood Glucose Calibration (TRUE METRIX LEVEL 1) Low SOLN  09/20/21   [provider]  Blood Glucose Monitoring Suppl (TRUE METRIX METER) w/Device KIT  09/20/21   [provider]  budesonide-formoterol (SYMBICORT) 80-4.5 MCG/ACT  inhaler Inhale 2 puffs into the lungs in the morning and at bedtime. 10/23/23   Birder Robson, MD  Carbinoxamine Maleate 4 MG TABS Take 2 tablets (8 mg total) by mouth 2 (two) times daily. 12/14/23   Marcelyn Bruins, MD  cholecalciferol (VITAMIN D3) 25 MCG (1000 UNIT) tablet Take 2,000 Units by mouth daily.    [provider]  cycloSPORINE (RESTASIS) 0.05 % ophthalmic emulsion Place 1 drop into both eyes 2 times daily. 03/08/22   [provider]  EPINEPHrine 0.3 mg/0.3 mL IJ SOAJ injection Inject 0.3 mg into the muscle as needed for anaphylaxis. 12/14/23   Marcelyn Bruins, MD  famotidine (PEPCID) 20 MG tablet Take 20 mg by mouth 2 (two) times daily.    [provider]  fluconazole (DIFLUCAN) 100 MG tablet Take 1 tablet (100 mg  total) by mouth daily. Take 1 tablet by mouth now repeat in 5 days 02/13/24   [name], FNP  fluticasone (FLONASE) 50 MCG/ACT nasal spray Place 2 sprays into both nostrils daily. 12/25/23   Newman Pies, MD  metoprolol succinate (TOPROL-XL) 25 MG 24 hr tablet Take 1 tablet (25 mg total) by mouth daily. 10/10/23   Tobb, Kardie, DO  montelukast (SINGULAIR) 10 MG tablet Take 1 tablet (10 mg total) by mouth daily. 12/14/23   Marcelyn Bruins, MD  Multiple Vitamin (MULTIVITAMIN WITH MINERALS) TABS tablet Take 1 tablet by mouth daily.    [provider]  ondansetron (ZOFRAN-ODT) 4 MG disintegrating tablet Take 1 tablet (4 mg total) by mouth every 8 (eight) hours as needed for nausea or vomiting. 11/26/23   Doristine Mango A, PA-C  pantoprazole (PROTONIX) 40 MG tablet Take 1 tablet (40 mg total) by mouth daily. 12/14/23   Marcelyn Bruins, MD  pravastatin (PRAVACHOL) 20 MG tablet Take 1 tablet (20 mg total) by mouth every evening. 10/10/23 10/09/24  Tobb, Kardie, DO  tirzepatide [facility]) 2.5 MG/0.5ML Pen Inject 2.5 mg into the skin once a week. 02/13/24   [name], FNP  TRUE METRIX BLOOD GLUCOSE TEST test strip 1 each  by Other route 3 (three) times daily. 02/13/24   [name], FNP  TRUEplus Lancets 33G MISC Apply 1 each topically 3 (three) times daily. 02/13/24   [name], FNP  esomeprazole (NEXIUM) 40 MG capsule Take 1 capsule (40 mg total) by mouth daily. Patient not taking: Reported on 07/08/2020 04/24/19 08/06/20  Arby Barrette, MD    Family History Family History  Problem Relation Age of Onset   Allergic rhinitis Mother    Diabetes Father        type 2   Heart disease Father    Hypertension Father    Allergic rhinitis Father    Allergic rhinitis Sister    Collagen disease Sister    Heart disease Brother    Hypertension Brother    Angioedema Neg Hx    Asthma Neg Hx    Atopy Neg Hx    Eczema Neg Hx    Immunodeficiency Neg Hx    Urticaria Neg Hx     Social History Social History   Tobacco Use   Smoking status: Former    Current packs/day: 0.00    Average packs/day: 0.5 packs/day for 30.0 years (15.0 ttl pk-yrs)    Types: Cigarettes    Start date: 12/08/1964    Quit date: 12/08/1994    Years since quitting: 29.2   Smokeless tobacco: Never  Vaping Use   Vaping status: Never Used  Substance Use Topics   Alcohol use: No   Drug use: No     Allergies   Crab [shellfish allergy], Erythromycin, Hm lidocaine patch [lidocaine], Erdafitinib, Fish allergy, Metoclopramide, Sulfacetamide, Sulfacetamide sodium-sulfur, Doxycycline, Levaquin [levofloxacin in d5w], Morphine, and Sulfa antibiotics   Review of Systems Review of Systems Per HPI  Physical Exam Triage Vital Signs ED Triage Vitals  Encounter Vitals Group     BP 02/24/24 1531 119/79     Systolic BP Percentile --      Diastolic BP Percentile --      Pulse Rate 02/24/24 1531 (!) 108     Resp 02/24/24 1531 18     Temp 02/24/24 1531 99.4 F (37.4 C)     Temp Source 02/24/24 1531 Oral     SpO2 02/24/24 1531 94 %     Weight --  Height --      Head Circumference --      Peak Flow --      Pain Score 02/24/24 1529 7      Pain Loc --      Pain Education --      Exclude from Growth Chart --    No data found.  Updated Vital Signs BP 119/79 (BP Location: Right Arm)   Pulse (!) 108   Temp 99.4 F (37.4 C) (Oral)   Resp 18   SpO2 94%   Visual Acuity Right Eye Distance:   Left Eye Distance:   Bilateral Distance:    Right Eye Near:   Left Eye Near:    Bilateral Near:     Physical Exam Constitutional:      General: She is not in acute distress.    Appearance: Normal appearance. She is not toxic-appearing or diaphoretic.  HENT:     Head: Normocephalic and atraumatic.     Right Ear: Tympanic membrane and ear canal normal.     Left Ear: Tympanic membrane and ear canal normal.     Nose: Congestion present.     Right Nostril: No epistaxis.     Left Nostril: No epistaxis.     Mouth/Throat:     Mouth: Mucous membranes are moist.     Pharynx: No posterior oropharyngeal erythema.  Eyes:     Extraocular Movements: Extraocular movements intact.     Conjunctiva/sclera: Conjunctivae normal.     Pupils: Pupils are equal, round, and reactive to light.  Cardiovascular:     Rate and Rhythm: Normal rate and regular rhythm.     Pulses: Normal pulses.     Heart sounds: Normal heart sounds.  Pulmonary:     Effort: Pulmonary effort is normal. No respiratory distress.     Breath sounds: Normal breath sounds. No wheezing.  Musculoskeletal:        General: Normal range of motion.     Cervical back: Normal range of motion.  Skin:    General: Skin is warm and dry.  Neurological:     General: No focal deficit present.     Mental Status: She is alert and oriented to person, place, and time. Mental status is at baseline.  Psychiatric:        Mood and Affect: Mood normal.        Behavior: Behavior normal.      UC Treatments / Results  Labs (all labs ordered are listed, but only abnormal results are displayed) Labs Reviewed - No data to display  EKG   Radiology No results  found.  Procedures Procedures (including critical care time)  Medications Ordered in UC Medications  methylPREDNISolone acetate (DEPO-MEDROL) injection 80 mg (80 mg Intramuscular Given 02/24/24 1556)    Initial Impression / Assessment and Plan / UC Course  I have reviewed the triage vital signs and the nursing notes.  Pertinent labs & imaging results that were available during my care of the patient were reviewed by me and considered in my medical decision making (see chart for details).     Patient has recurrent sinus infection.  Recently completed antibiotic therapy with minimal improvement.Do not think additional antibiotic therapy is necessary at this time.  Patient has received steroids in the past for similar symptoms which has provided improvement.  I do think this will be beneficial for patient given antibiotics were not very helpful, although I did discuss with patient the risks of hyperglycemia with steroid therapy  especially given patient's last A1c was elevated.  She voiced understanding of these risks and wishes to proceed with steroid therapy.  Therefore, I advised patient to diligently monitor her blood glucose over the next few days.  She voiced understanding of this. Last EF is normal so this should be safe. Advised patient to follow-up with ENT specialist and PCP as soon as possible given persistent symptoms.  She verbalized understanding and was agreeable with plan. Final Clinical Impressions(s) / UC Diagnoses   Final diagnoses:  Sinus pressure     Discharge Instructions      You were given a steroid shot to help decrease sinus pressure.  Follow-up with ENT and primary care doctor.    ED Prescriptions   None    PDMP not reviewed this encounter.   Gustavus Bryant, [place] 02/24/24 [phone]

## 2024-02-27 ENCOUNTER — Telehealth: Payer: Self-pay | Admitting: Pharmacist

## 2024-02-27 DIAGNOSIS — E1140 Type 2 diabetes mellitus with diabetic neuropathy, unspecified: Secondary | ICD-10-CM

## 2024-02-28 ENCOUNTER — Telehealth: Payer: Self-pay

## 2024-02-28 NOTE — Progress Notes (Signed)
 02/28/2024 Name: MOMOKO SLEZAK MRN: [id number] DOB: [birth year]  Chief Complaint  Patient presents with   Medication Management    Diabetes     Enslie JOSHLYN BEADLE is a 68 y.o. year old female who presented for a telephone visit.   They were referred to the pharmacist by a quality report for assistance in managing diabetes.  [place] Metric Diabetes   Subjective:  Care Team: Primary Care Provider: [name], FNP ; Next Scheduled Visit: 09/25 Endocrinologist Motwani; Next Scheduled Visit: 03/14/2024  Medication Access/Adherence  Current Pharmacy:  [id number] - [place], [place] - [address] AT [facility] OF [address] [place] [address] Phone: [phone] Fax: [phone]   Patient reports affordability concerns with their medications: No  Patient reports access/transportation concerns to their pharmacy: No  Patient reports adherence concerns with their medications:  Yes  Stopped using Mounjaro because she thinks it caused constipation.  After speaking with her at length, she said she would restart it.  She recently visited urgent care for sinusitis and nose bleeds.  She is actively been seen by ENT.  She was given a steroid shot at Urgent care and told to follow up with ENT.     Diabetes:  Current medications:  Mounjaro 2.5 mg weekly  Current glucose readings: 201 mg/dl ( this morning)  Patient denies hypoglycemic s/sx including  dizziness, shakiness, sweating. Patient denies hyperglycemic symptoms including  polyuria, polydipsia, polyphagia, nocturia, neuropathy, blurred vision.  Patient admitted that she stopped Mounjaro because she though it was causing her to be constipated.  We talked at length about her not being on anything to manage her blood sugars and she promised she would restart it.  With her A1c being over 9%, she may benefit from low dose basal insulin therapy once daily.     Being on insulin therapy would help her control her blood sugars and would also allow her to qualify to be on a CGM.  Patient does not like to prick her finger and does not check her blood sugar often because of this.  A CGM would allow her to see her blood sugars in real time, provide her with a snap shot of how well her blood sugars are controlled and alert her of any extreme highs or lows.  She did complain of being constipated and suffering with hemorrhoids.  I reviewed the gastro note from [place] from last year. Looks like she was given hydrocortisone suppositories with 2 refills.  Patient said she never got the refills.  I called [facility] and they did not have a record of hydrocortisone suppositories ever being filled.  I spoke with the [facility] Pharmacist, Raven and she confirmed the prescription went to [facility] on [address] but neither of [place] could find proof the prescription had ever been filled.  Dr. Tiajuana Amass  did not show any fill history for Hydrocortisone suppositories and the Pharmacy also confirmed it had not been filled.  Raven sent over a request for them to resend the prescription.    Objective:  Lab Results  Component Value Date   HGBA1C 9.1 (H) 01/15/2024    Lab Results  Component Value Date   CREATININE 0.98 01/15/2024   BUN 10 01/15/2024   NA 139 01/15/2024   K 4.1 01/15/2024   CL 103 01/15/2024   CO2 27 01/15/2024    Lab Results  Component Value Date   CHOL 138 01/15/2024   HDL  54 01/15/2024   LDLCALC 60 01/15/2024   TRIG 158 (H) 01/15/2024   CHOLHDL 2.6 01/15/2024    Medications Reviewed Today   Medications were not reviewed in this encounter       Assessment/Plan:   Diabetes: - Currently uncontrolled A1c 9.1% -Discussed the potential of Basal Insulin Therapy-Patient said she had been on insulin and metformin in the past and that it worked well.  Janumet was recently discontinued due to the patient complaining of GI side effects. - Reviewed  goal A1c, goal fasting, and goal 2 hour post prandial glucose -Recommend she schedule an appointment to see ENT and be sure to keep her 03/14/24 appointment with Endocrinology  Follow Up Plan:    Follow up after her Endocrinology appointment. Send note to Endo   Beecher Mcardle, PharmD, [facility] Clinical Pharmacist [phone]

## 2024-02-28 NOTE — Telephone Encounter (Signed)
 Patient was identified as falling into the True [place] Measure - Diabetes.   Patient was: Patient refuses intervention.  HAD A1C DONE 01/15/2024

## 2024-03-02 ENCOUNTER — Emergency Department (HOSPITAL_COMMUNITY)
Admission: EM | Admit: 2024-03-02 | Discharge: 2024-03-02 | Disposition: A | Discharge status: Home / Self Care | Attending: Emergency Medicine | Admitting: Emergency Medicine

## 2024-03-02 DIAGNOSIS — I10 Essential (primary) hypertension: Secondary | ICD-10-CM | POA: Insufficient documentation

## 2024-03-02 DIAGNOSIS — B9789 Other viral agents as the cause of diseases classified elsewhere: Secondary | ICD-10-CM | POA: Diagnosis not present

## 2024-03-02 DIAGNOSIS — Z79899 Other long term (current) drug therapy: Secondary | ICD-10-CM | POA: Diagnosis not present

## 2024-03-02 DIAGNOSIS — E119 Type 2 diabetes mellitus without complications: Secondary | ICD-10-CM | POA: Insufficient documentation

## 2024-03-02 DIAGNOSIS — J028 Acute pharyngitis due to other specified organisms: Secondary | ICD-10-CM | POA: Insufficient documentation

## 2024-03-02 DIAGNOSIS — J029 Acute pharyngitis, unspecified: Secondary | ICD-10-CM | POA: Diagnosis present

## 2024-03-02 LAB — CBC WITH DIFFERENTIAL/PLATELET
Abs Immature Granulocytes: 0.05 10*3/uL (ref 0.00–0.07)
Basophils Absolute: 0 10*3/uL (ref 0.0–0.1)
Basophils Relative: 1 %
Eosinophils Absolute: 0 10*3/uL (ref 0.0–0.5)
Eosinophils Relative: 1 %
HCT: 49.6 % — ABNORMAL HIGH (ref 36.0–46.0)
Hemoglobin: 16.1 g/dL — ABNORMAL HIGH (ref 12.0–15.0)
Immature Granulocytes: 1 %
Lymphocytes Relative: 33 %
Lymphs Abs: 1.6 10*3/uL (ref 0.7–4.0)
MCH: 29 pg (ref 26.0–34.0)
MCHC: 32.5 g/dL (ref 30.0–36.0)
MCV: 89.2 fL (ref 80.0–100.0)
Monocytes Absolute: 0.5 10*3/uL (ref 0.1–1.0)
Monocytes Relative: 11 %
Neutro Abs: 2.6 10*3/uL (ref 1.7–7.7)
Neutrophils Relative %: 53 %
Platelets: 297 10*3/uL (ref 150–400)
RBC: 5.56 MIL/uL — ABNORMAL HIGH (ref 3.87–5.11)
RDW: 13.4 % (ref 11.5–15.5)
WBC: 4.8 10*3/uL (ref 4.0–10.5)
nRBC: 0 % (ref 0.0–0.2)

## 2024-03-02 LAB — RESP PANEL BY RT-PCR (RSV, FLU A&B, COVID)  RVPGX2
Influenza A by PCR: NEGATIVE
Influenza B by PCR: NEGATIVE
Resp Syncytial Virus by PCR: NEGATIVE
SARS Coronavirus 2 by RT PCR: NEGATIVE

## 2024-03-02 LAB — COMPREHENSIVE METABOLIC PANEL WITH GFR
ALT: 21 U/L (ref 0–44)
AST: 19 U/L (ref 15–41)
Albumin: 3.5 g/dL (ref 3.5–5.0)
Alkaline Phosphatase: 72 U/L (ref 38–126)
Anion gap: 8 (ref 5–15)
BUN: 7 mg/dL — ABNORMAL LOW (ref 8–23)
CO2: 23 mmol/L (ref 22–32)
Calcium: 9.4 mg/dL (ref 8.9–10.3)
Chloride: 110 mmol/L (ref 98–111)
Creatinine, Ser: 0.76 mg/dL (ref 0.44–1.00)
GFR, Estimated: >60 mL/min (ref >60)
Glucose, Bld: 175 mg/dL — ABNORMAL HIGH (ref 70–99)
Potassium: 3.9 mmol/L (ref 3.5–5.1)
Sodium: 141 mmol/L (ref 135–145)
Total Bilirubin: 0.7 mg/dL (ref 0.0–1.2)
Total Protein: 6.5 g/dL (ref 6.5–8.1)

## 2024-03-02 LAB — GROUP A STREP BY PCR: Group A Strep by PCR: NOT DETECTED

## 2024-03-02 MED ORDER — PREDNISONE 10 MG PO TABS: 30.0000 mg | ORAL_TABLET | Freq: Every day | ORAL | 0 refills | Status: AC

## 2024-03-02 NOTE — ED Provider Notes (Signed)
 [place] EMERGENCY DEPARTMENT AT [facility] Provider Note   CSN: [id number] Arrival date & time: 03/02/24  4098     History  Chief Complaint  Patient presents with   Sore Throat    Pamela Roberson is a 68 y.o. female.  Patient with past history history significant for diabetes with diabetic neuropathy, hypertension, obesity, arthritis and pharyngeal dysphagia presents emergency department today with concerns of a sore throat.  Endorses having a sore throat for several weeks.  States she was recently treated for sinusitis with ENT and was seen at urgent care and started on a one-time injection of steroids.  She reports improvement from the steroids but states that she still feeling somewhat of a sore throat.  Denies any recent fever chills or bodyaches.  States that she unable to tolerate eating and drinking but concerned that pain has not been improving.  Tolerating oral secretions.   Sore Throat       Home Medications Prior to Admission medications   Medication Sig Start Date End Date Taking? Authorizing Provider  predniSONE (DELTASONE) 10 MG tablet Take 3 tablets (30 mg total) by mouth daily for 5 days. 03/02/24 03/07/24 Yes Smitty Knudsen, PA-C  acyclovir (ZOVIRAX) 400 MG tablet Take 1 tablet (400 mg total) by mouth 2 (two) times daily. 02/13/24   [name], FNP  albuterol (PROVENTIL) (2.5 MG/3ML) 0.083% nebulizer solution Take 3 mLs (2.5 mg total) by nebulization every 4 (four) hours as needed for wheezing or shortness of breath. 12/14/23   Marcelyn Bruins, MD  albuterol (VENTOLIN HFA) 108 (90 Base) MCG/ACT inhaler Inhale 2 puffs into the lungs every 4 (four) hours as needed for wheezing or shortness of breath. 12/14/23   Marcelyn Bruins, MD  amLODipine (NORVASC) 5 MG tablet Take 1 tablet (5 mg total) by mouth daily. 10/10/23   Tobb, Kardie, DO  Artificial Tear Ointment (DRY EYES OP) Place 1-2 drops into both eyes daily as needed (for dry eyes).      [provider]  ascorbic acid (VITAMIN C) 1000 MG tablet Take 1 tablet by mouth daily.    [provider]  azelastine (OPTIVAR) 0.05 % ophthalmic solution Place 1 drop into both eyes 2 (two) times daily. 12/14/23   Marcelyn Bruins, MD  Blood Glucose Calibration (TRUE METRIX LEVEL 1) Low SOLN  09/20/21   [provider]  Blood Glucose Monitoring Suppl (TRUE METRIX METER) w/Device KIT  09/20/21   [provider]  budesonide-formoterol (SYMBICORT) 80-4.5 MCG/ACT inhaler Inhale 2 puffs into the lungs in the morning and at bedtime. 10/23/23   Birder Robson, MD  Carbinoxamine Maleate 4 MG TABS Take 2 tablets (8 mg total) by mouth 2 (two) times daily. 12/14/23   Marcelyn Bruins, MD  cholecalciferol (VITAMIN D3) 25 MCG (1000 UNIT) tablet Take 2,000 Units by mouth daily.    [provider]  cycloSPORINE (RESTASIS) 0.05 % ophthalmic emulsion Place 1 drop into both eyes 2 times daily. 03/08/22   [provider]  EPINEPHrine 0.3 mg/0.3 mL IJ SOAJ injection Inject 0.3 mg into the muscle as needed for anaphylaxis. 12/14/23   Marcelyn Bruins, MD  famotidine (PEPCID) 20 MG tablet Take 20 mg by mouth 2 (two) times daily. Patient not taking: Reported on 02/28/2024    [provider]  fluconazole (DIFLUCAN) 100 MG tablet Take 1 tablet (100 mg total) by mouth daily. Take 1 tablet by mouth now repeat in 5 days 02/13/24  [name], FNP  fluticasone (FLONASE) 50 MCG/ACT nasal spray Place 2 sprays into both nostrils daily. 12/25/23   Newman Pies, MD  metoprolol succinate (TOPROL-XL) 25 MG 24 hr tablet Take 1 tablet (25 mg total) by mouth daily. 10/10/23   Tobb, Kardie, DO  montelukast (SINGULAIR) 10 MG tablet Take 1 tablet (10 mg total) by mouth daily. 12/14/23   Marcelyn Bruins, MD  Multiple Vitamin (MULTIVITAMIN WITH MINERALS) TABS tablet Take 1 tablet by mouth daily.    [provider]  ondansetron (ZOFRAN-ODT) 4 MG  disintegrating tablet Take 1 tablet (4 mg total) by mouth every 8 (eight) hours as needed for nausea or vomiting. 11/26/23   Doristine Mango A, PA-C  pantoprazole (PROTONIX) 40 MG tablet Take 1 tablet (40 mg total) by mouth daily. 12/14/23   Marcelyn Bruins, MD  pravastatin (PRAVACHOL) 20 MG tablet Take 1 tablet (20 mg total) by mouth every evening. 10/10/23 10/09/24  Tobb, Kardie, DO  tirzepatide [facility]) 2.5 MG/0.5ML Pen Inject 2.5 mg into the skin once a week. 02/13/24   [name], FNP  TRUE METRIX BLOOD GLUCOSE TEST test strip 1 each by Other route 3 (three) times daily. 02/13/24   [name], FNP  TRUEplus Lancets 33G MISC Apply 1 each topically 3 (three) times daily. 02/13/24   [name], FNP  esomeprazole (NEXIUM) 40 MG capsule Take 1 capsule (40 mg total) by mouth daily. Patient not taking: Reported on 07/08/2020 04/24/19 08/06/20  Arby Barrette, MD      Allergies    Parke Simmers allergy], Erythromycin, Hm lidocaine patch [lidocaine], Erdafitinib, Fish allergy, Metoclopramide, Sulfacetamide, Sulfacetamide sodium-sulfur, Doxycycline, Levaquin [levofloxacin in d5w], Morphine, and Sulfa antibiotics    Review of Systems   Review of Systems  HENT:  Positive for sore throat.   All other systems reviewed and are negative.   Physical Exam Updated Vital Signs BP 137/85   Pulse 85   Temp 98.1 F (36.7 C) (Oral)   Resp 18   Ht 5\' 6"  (1.676 m)   Wt 94.3 kg   SpO2 99%   BMI 33.57 kg/m  Physical Exam Vitals and nursing note reviewed.  Constitutional:      General: She is not in acute distress.    Appearance: She is well-developed.  HENT:     Head: Normocephalic and atraumatic.     Right Ear: Tympanic membrane and ear canal normal.     Left Ear: Tympanic membrane and ear canal normal.     Mouth/Throat:     Mouth: Mucous membranes are moist. No oral lesions.     Pharynx: Oropharynx is clear. Uvula midline. Posterior oropharyngeal erythema present. No pharyngeal  swelling or oropharyngeal exudate.     Tonsils: No tonsillar exudate or tonsillar abscesses. 0 on the right. 0 on the left.  Eyes:     Conjunctiva/sclera: Conjunctivae normal.  Cardiovascular:     Rate and Rhythm: Normal rate and regular rhythm.     Heart sounds: No murmur heard. Pulmonary:     Effort: Pulmonary effort is normal. No respiratory distress.     Breath sounds: Normal breath sounds.  Abdominal:     Palpations: Abdomen is soft.     Tenderness: There is no abdominal tenderness.  Musculoskeletal:        General: No swelling.     Cervical back: Neck supple.  Skin:    General: Skin is warm and dry.     Capillary Refill: Capillary refill takes less than 2 seconds.  Neurological:     Mental Status: She is alert.  Psychiatric:        Mood and Affect: Mood normal.     ED Results / Procedures / Treatments   Labs (all labs ordered are listed, but only abnormal results are displayed) Labs Reviewed  CBC WITH DIFFERENTIAL/PLATELET - Abnormal; Notable for the following components:      Result Value   RBC 5.56 (*)    Hemoglobin 16.1 (*)    HCT 49.6 (*)    All other components within normal limits  COMPREHENSIVE METABOLIC PANEL WITH GFR - Abnormal; Notable for the following components:   Glucose, Bld 175 (*)    BUN 7 (*)    All other components within normal limits  RESP PANEL BY RT-PCR (RSV, FLU A&B, COVID)  RVPGX2  GROUP A STREP BY PCR    EKG None  Radiology No results found.  Procedures Procedures    Medications Ordered in ED Medications - No data to display  ED Course/ Medical Decision Making/ A&P                                 Medical Decision Making Amount and/or Complexity of Data Reviewed Labs: ordered.     This patient presents to the ED for concern of sore throat. Differential diagnosis includes pharyngitis, strep throat, viral URI, chronic sinusitis, sinus pressure   Lab Tests:  I Ordered, and personally interpreted labs.  The pertinent  results include: CBC unremarkable, CMP unremarkable, respiratory panel negative, group A strep negative   Problem List / ED Course:  Patient with past history significant for chronic sinusitis presents the emergency department today with concerns of sore throat.  Patient states that she is currently being seen by ENT who has been attending to manage her sinusitis.  Recently finished course of antibiotics without improvement in her sinus pressure.  Was seen in urgent care few days ago and given a one-time injected steroid which she reports improved some of her symptoms.  Still endorses some sore throat but denies any dysphagia.  Able to tolerate eating and drinking without significant difficulty although she does endorse some pain with this. Physical exam unremarkable.  Mild pharyngeal erythema but no tonsillar exudate present.  Minimal anterior cervical chain lymphadenopathy.  No recent fever, chills, or bodyaches a doubtful of strep pharyngitis.  Will obtain viral and strep swab as well as basic labs assess for white count. Workup unremarkable.  Negative for strep and viral processes.  Most likely to be a viral pharyngitis at this time.  Will initiate a short course of prednisone to use at home for the next 5 days.  Encourage close follow-up with ENT.  Patient continues to tolerate oral secretions without difficulty and is otherwise stable at this time for outpatient follow-up and discharged home.  Final Clinical Impression(s) / ED Diagnoses Final diagnoses:  Viral pharyngitis    Rx / DC Orders ED Discharge Orders          Ordered    predniSONE (DELTASONE) 10 MG tablet  Daily        03/02/24 1225              Smitty Knudsen, PA-C 03/02/24 1229    Margarita Grizzle, MD 03/03/24 1008

## 2024-03-02 NOTE — ED Triage Notes (Signed)
 Patient c/o sore throat for several weeks Says she was treated for sinnusitis Says feels like a lump of coal in throat No acute distress Patient a/o, able to verbalize concerns  Pain rated 7/10

## 2024-03-02 NOTE — Discharge Instructions (Addendum)
 You are seen in the emergency department today for concerns of a sore throat.  Your labs were thankfully all reassuring and I suspect likely having viral pharyngitis.  Given the) prior improvement with steroids, short course of steroid as it is at your pharmacy.  Please take this as prescribed.  Chronic concerns or new or worsening symptoms, please follow-up with your ENT or return to the emergency department.

## 2024-03-08 ENCOUNTER — Ambulatory Visit (INDEPENDENT_AMBULATORY_CARE_PROVIDER_SITE_OTHER): Payer: 59 | Admitting: Otolaryngology

## 2024-03-08 VITALS — BP 138/83 | HR 76 | Ht 66.0 in | Wt 208.0 lb

## 2024-03-08 DIAGNOSIS — J310 Chronic rhinitis: Secondary | ICD-10-CM | POA: Diagnosis not present

## 2024-03-08 DIAGNOSIS — R040 Epistaxis: Secondary | ICD-10-CM

## 2024-03-08 DIAGNOSIS — R0981 Nasal congestion: Secondary | ICD-10-CM | POA: Diagnosis not present

## 2024-03-08 DIAGNOSIS — J320 Chronic maxillary sinusitis: Secondary | ICD-10-CM

## 2024-03-08 DIAGNOSIS — J343 Hypertrophy of nasal turbinates: Secondary | ICD-10-CM

## 2024-03-08 NOTE — Progress Notes (Signed)
 Patient ID: Pamela Roberson, female   DOB: July 12, 1956, 68 y.o.   MRN: [id number]  Follow-up: Recurrent sinusitis, chronic nasal congestion, recurrent left epistaxis  HPI: The patient is a 68 year old female who returns today for her follow-up evaluation.  The patient has a history of recurrent rhinosinusitis, chronic nasal congestion, and recurrent left epistaxis.  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  Hypervascular areas were also noted on the left anterior and superior nasal septum.  She was treated with endoscopic cauterization of the left nasal septum.  She was also placed on medicated nasal saline irrigation and Flonase nasal spray.  The patient returns today complaining of more recurrent left-sided epistaxis.  She was seen at a [facility] health emergency room last week.  Currently the patient denies any facial pain, fever, or visual change.  She is out of her medicated (mupirocin/budesonide) nasal rinse.  She could not afford the cost of the medications.  Exam: General: Communicates without difficulty, well nourished, no acute distress. Head: Normocephalic, no evidence injury, no tenderness, facial buttresses intact without stepoff. Face/sinus: No tenderness to palpation and percussion. Facial movement is normal and symmetric. Eyes: PERRL, EOMI. No scleral icterus, conjunctivae clear. Neuro: CN II exam reveals vision grossly intact.  No nystagmus at any point of gaze. Ears: Auricles well formed without lesions.  Ear canals are intact without mass or lesion.  No erythema or edema is appreciated.  The TMs are intact without fluid. Nose: External evaluation reveals normal support and skin without lesions.  Dorsum is intact.  Anterior rhinoscopy reveals congested mucosa over anterior aspect of inferior turbinates and intact septum.  Oral:  Oral cavity and oropharynx are intact, symmetric, without  erythema or edema.  Mucosa is moist without lesions. Neck: Full range of motion without pain.  There is no significant lymphadenopathy.  No masses palpable.  Thyroid bed within normal limits to palpation.  Parotid glands and submandibular glands equal bilaterally without mass.  Trachea is midline. Neuro:  CN 2-12 grossly intact.    Procedure: Repeat endoscopic control of recurrent left epistaxis. Indication:  Recurrent epistaxis  Description:  The left nasal cavity is sprayed with topical xylocaine and neo-synephrine.  After adequate anesthesia is achieved, the nasal cavity is examined with a 0 rigid endoscope.  Edematous nasal mucosa is noted.  The inferior turbinate is hypertrophied.  A suction catheter is inserted into parallel with the 0 endoscope, and it is used to suction dry blood clots from the nasal cavity.  Several hypervascular areas are again noted on the anterior and superior portion of the septum. A silver nitrate stick is inserted in parallel with the 0 endoscope.  It is used to repeatedly cauterized the hypervascular areas.  Good hemostasis is achieved.  The patient tolerated the procedure well.     Assessment: 1.  More recurrent left epistaxis.  The patient is again noted to have hypervascular areas on the left anterior and superior nasal septum. 2.  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  No purulent drainage is noted today.  Plan: 1.  The physical exam and nasal endoscopy findings are reviewed with the patient. 2.  Endoscopic cauterization of the left anterior and superior nasal septum. 3.  Nasal saline irrigation daily. 4.  Continue with Flonase nasal spray 2 sprays each nostril daily. 5.  The patient will return for reevaluation in 3 months, sooner if needed.

## 2024-03-14 ENCOUNTER — Encounter: Payer: Self-pay | Admitting: "Endocrinology

## 2024-03-14 ENCOUNTER — Ambulatory Visit (INDEPENDENT_AMBULATORY_CARE_PROVIDER_SITE_OTHER): Admitting: "Endocrinology

## 2024-03-14 VITALS — BP 122/70 | HR 87 | Ht 66.0 in | Wt 202.8 lb

## 2024-03-14 DIAGNOSIS — Z7984 Long term (current) use of oral hypoglycemic drugs: Secondary | ICD-10-CM | POA: Diagnosis not present

## 2024-03-14 DIAGNOSIS — E1140 Type 2 diabetes mellitus with diabetic neuropathy, unspecified: Secondary | ICD-10-CM

## 2024-03-14 DIAGNOSIS — E1165 Type 2 diabetes mellitus with hyperglycemia: Secondary | ICD-10-CM | POA: Diagnosis not present

## 2024-03-14 DIAGNOSIS — Z794 Long term (current) use of insulin: Secondary | ICD-10-CM

## 2024-03-14 DIAGNOSIS — E7800 Pure hypercholesterolemia, unspecified: Secondary | ICD-10-CM

## 2024-03-14 DIAGNOSIS — G629 Polyneuropathy, unspecified: Secondary | ICD-10-CM

## 2024-03-14 MED ORDER — BLOOD GLUCOSE MONITORING SUPPL DEVI: 1.0000 | Freq: Three times a day (TID) | 0 refills | Status: AC

## 2024-03-14 MED ORDER — LANCETS MISC. MISC: 1.0000 | Freq: Three times a day (TID) | 3 refills | Status: AC

## 2024-03-14 MED ORDER — BLOOD GLUCOSE TEST VI STRP: 1.0000 | ORAL_STRIP | Freq: Three times a day (TID) | 3 refills | Status: AC

## 2024-03-14 MED ORDER — LANCET DEVICE MISC: 1.0000 | Freq: Three times a day (TID) | 0 refills | Status: AC

## 2024-03-14 MED ORDER — INSULIN GLARGINE 100 UNIT/ML SOLOSTAR PEN: 10.0000 [IU] | PEN_INJECTOR | Freq: Every day | SUBCUTANEOUS | 1 refills | Status: DC

## 2024-03-14 MED ORDER — DEXCOM G7 RECEIVER DEVI: 1.0000 | 0 refills | Status: AC

## 2024-03-14 MED ORDER — DEXCOM G7 SENSOR MISC: 1.0000 | 0 refills | Status: DC

## 2024-03-14 MED ORDER — METFORMIN HCL ER 500 MG PO TB24
500.0000 mg | ORAL_TABLET | Freq: Two times a day (BID) | ORAL | 2 refills | Status: DC
  Filled 2024-06-04: qty 120, 60d supply, fill #0
  Filled 2024-07-29 (×3): qty 120, 60d supply, fill #1

## 2024-03-14 NOTE — Patient Instructions (Signed)
  We will start you on an extended release version of metformin, which should help with the upset stomach. Start with one 500 mg capsule taken every evening with dinner. Stay on this for 3-5 days, then increase to 1 tablet with breakfast and one with dinner. If you do not have any upset stomach, gradually increase to the goal dose of 2 capsules with breakfast and 2 capsules with dinner. If you do have upset stomach, decrease it back to the previous dose that you tolerated.  Week 1: one tablet after dinner Week 2: one tablet after breakfast and one after dinner  Week 3: one tablet after breakfast and two after dinner  Week 4 and onwards: two tablets after breakfast and two after dinner  Start lantus 10 units qam

## 2024-03-14 NOTE — Progress Notes (Signed)
 Outpatient Endocrinology Note Altamese Ashdown, MD  03/14/24   Pamela Roberson 02-26-1967 [id number]  Referring Provider: [name], FNP Primary Care Provider: [name], FNP Reason for consultation: Subjective   Assessment & Plan  Diagnoses and all orders for this visit:  Uncontrolled type 2 diabetes mellitus with hyperglycemia (HCC) -     Ambulatory referral to diabetic education  Neuropathy  Pure hypercholesterolemia  Other orders -     Blood Glucose Monitoring Suppl DEVI; 1 each by Does not apply route in the morning, at noon, and at bedtime. May substitute to any manufacturer covered by patient's insurance. -     Glucose Blood (BLOOD GLUCOSE TEST STRIPS) STRP; 1 each by In Vitro route in the morning, at noon, and at bedtime. May substitute to any manufacturer covered by patient's insurance. -     Lancet Device MISC; 1 each by Does not apply route in the morning, at noon, and at bedtime. May substitute to any manufacturer covered by patient's insurance. -     Lancets Misc. MISC; 1 each by Does not apply route in the morning, at noon, and at bedtime. May substitute to any manufacturer covered by patient's insurance. -     Continuous Glucose Receiver (DEXCOM G7 RECEIVER) DEVI; 1 Device by Does not apply route continuous. -     Continuous Glucose Sensor (DEXCOM G7 SENSOR) MISC; 1 Device by Does not apply route continuous. -     insulin glargine (LANTUS) 100 UNIT/ML Solostar Pen; Inject 10 Units into the skin daily. -     metFORMIN (GLUCOPHAGE-XR) 500 MG 24 hr tablet; Take 1 tablet (500 mg total) by mouth 2 (two) times daily with a meal.    Diabetes Type II complicated by neuropathy,  Lab Results  Component Value Date   GFR 89.00 09/01/2016   Hba1c goal less than 7, current Hba1c is  Lab Results  Component Value Date   HGBA1C 9.1 (H) 01/15/2024   Will recommend the following: Lantus 10 units every day   We will start you on an extended release version  of metformin, which should help with the upset stomach. Start with one 500 mg capsule taken every evening with dinner. Stay on this for 3-5 days, then increase to 1 tablet with breakfast and one with dinner. If you do not have any upset stomach, gradually increase to the goal dose of 2 capsules with breakfast and 2 capsules with dinner. If you do have upset stomach, decrease it back to the previous dose that you tolerated.  Week 1: one tablet after dinner Week 2: one tablet after breakfast and one after dinner  Week 3: one tablet after breakfast and two after dinner  Week 4 and onwards: two tablets after breakfast and two after dinner   Took mounjaro 2.5mg /week-stopped it due to constipation Took ozempic-stopped due to back rash  Metformin caused GI S/E Reports groin rash right now  Ordered DexCom G7 Ordered DM education Ordered meter with supplies  No known contraindications/side effects to any of above medications Glucagon discussed and prescribed with refills on 03/14/24  -Last LD and Tg are as follows: Lab Results  Component Value Date   LDLCALC 60 01/15/2024    Lab Results  Component Value Date   TRIG 158 (H) 01/15/2024   -On pravastatin 20 mg QD -Follow low fat diet and exercise   -Blood pressure goal <140/90 - Microalbumin/creatinine goal is < 30 -Last MA/Cr is as follows: Lab Results  Component Value  Date   MICROALBUR 0.7 01/15/2024   -not on ACE/ARB -diet changes including salt restriction -limit eating outside -counseled BP targets per standards of diabetes care -uncontrolled blood pressure can lead to retinopathy, nephropathy and cardiovascular and atherosclerotic heart disease  Reviewed and counseled on: -A1C target -Blood sugar targets -Complications of uncontrolled diabetes  -Checking blood sugar before meals and bedtime and bring log next visit -All medications with mechanism of action and side effects -Hypoglycemia management: rule of 15's, Glucagon  Emergency Kit and medical alert ID -low-carb low-fat plate-method diet -At least 20 minutes of physical activity per day -Annual dilated retinal eye exam and foot exam -compliance and follow up needs -follow up as scheduled or earlier if problem gets worse  Call if blood sugar is less than 70 or consistently above 250    Take a 15 gm snack of carbohydrate at bedtime before you go to sleep if your blood sugar is less than 100.    If you are going to fast after midnight for a test or procedure, ask your physician for instructions on how to reduce/decrease your insulin dose.    Call if blood sugar is less than 70 or consistently above 250  -Treating a low sugar by rule of 15  (15 gms of sugar every 15 min until sugar is more than 70) If you feel your sugar is low, test your sugar to be sure If your sugar is low (less than 70), then take 15 grams of a fast acting Carbohydrate (3-4 glucose tablets or glucose gel or 4 ounces of juice or regular soda) Recheck your sugar 15 min after treating low to make sure it is more than 70 If sugar is still less than 70, treat again with 15 grams of carbohydrate          Don't drive the hour of hypoglycemia  If unconscious/unable to eat or drink by mouth, use glucagon injection or nasal spray baqsimi and call 911. Can repeat again in 15 min if still unconscious.  Return in about 4 weeks (around 04/11/2024).   I have reviewed current medications, nurse's notes, allergies, vital signs, past medical and surgical history, family medical history, and social history for this encounter. Counseled patient on symptoms, examination findings, lab findings, imaging results, treatment decisions and monitoring and prognosis. The patient understood the recommendations and agrees with the treatment plan. All questions regarding treatment plan were fully answered.  Altamese Darrouzett, MD  03/14/24    History of Present Illness Pamela Roberson is a 68 y.o. year old female  who presents for evaluation of Type II diabetes mellitus.  Pamela Roberson was first diagnosed in 2005.   Diabetes education +  Home diabetes regimen: none  Took mounjaro 2.5mg /week-stopped it due to constipation Took ozempic-stopped due to back rash  Metformin caused GI S/E Reports groin rash right now  COMPLICATIONS -  MI/Stroke -  retinopathy +  neuropathy -  nephropathy  SYMPTOMS REVIEWED - Polyuria - Weight loss - Blurred vision  BLOOD SUGAR DATA Not checking sugars at home   Physical Exam  BP 122/70   Pulse 87   Ht 5\' 6"  (1.676 m)   Wt 202 lb 12.8 oz (92 kg)   SpO2 98%   BMI 32.73 kg/m    Constitutional: well developed, well nourished Head: normocephalic, atraumatic Eyes: sclera anicteric, no redness Neck: supple Lungs: normal respiratory effort Neurology: alert and oriented Skin: dry, no appreciable rashes Musculoskeletal: no appreciable defects Psychiatric: normal mood  and affect Diabetic Foot Exam - Simple   No data filed      Current Medications Patient's Medications  New Prescriptions   BLOOD GLUCOSE MONITORING SUPPL DEVI    1 each by Does not apply route in the morning, at noon, and at bedtime. May substitute to any manufacturer covered by patient's insurance.   CONTINUOUS GLUCOSE RECEIVER (DEXCOM G7 RECEIVER) DEVI    1 Device by Does not apply route continuous.   CONTINUOUS GLUCOSE SENSOR (DEXCOM G7 SENSOR) MISC    1 Device by Does not apply route continuous.   GLUCOSE BLOOD (BLOOD GLUCOSE TEST STRIPS) STRP    1 each by In Vitro route in the morning, at noon, and at bedtime. May substitute to any manufacturer covered by patient's insurance.   INSULIN GLARGINE (LANTUS) 100 UNIT/ML SOLOSTAR PEN    Inject 10 Units into the skin daily.   LANCET DEVICE MISC    1 each by Does not apply route in the morning, at noon, and at bedtime. May substitute to any manufacturer covered by patient's insurance.   LANCETS MISC. MISC    1 each by Does not apply  route in the morning, at noon, and at bedtime. May substitute to any manufacturer covered by patient's insurance.   METFORMIN (GLUCOPHAGE-XR) 500 MG 24 HR TABLET    Take 1 tablet (500 mg total) by mouth 2 (two) times daily with a meal.  Previous Medications   ACYCLOVIR (ZOVIRAX) 400 MG TABLET    Take 1 tablet (400 mg total) by mouth 2 (two) times daily.   ALBUTEROL (PROVENTIL) (2.5 MG/3ML) 0.083% NEBULIZER SOLUTION    Take 3 mLs (2.5 mg total) by nebulization every 4 (four) hours as needed for wheezing or shortness of breath.   ALBUTEROL (VENTOLIN HFA) 108 (90 BASE) MCG/ACT INHALER    Inhale 2 puffs into the lungs every 4 (four) hours as needed for wheezing or shortness of breath.   AMLODIPINE (NORVASC) 5 MG TABLET    Take 1 tablet (5 mg total) by mouth daily.   ARTIFICIAL TEAR OINTMENT (DRY EYES OP)    Place 1-2 drops into both eyes daily as needed (for dry eyes).    ASCORBIC ACID (VITAMIN C) 1000 MG TABLET    Take 1 tablet by mouth daily.   AZELASTINE (OPTIVAR) 0.05 % OPHTHALMIC SOLUTION    Place 1 drop into both eyes 2 (two) times daily.   BLOOD GLUCOSE CALIBRATION (TRUE METRIX LEVEL 1) LOW SOLN       BLOOD GLUCOSE MONITORING SUPPL (TRUE METRIX METER) W/DEVICE KIT       BUDESONIDE-FORMOTEROL (SYMBICORT) 80-4.5 MCG/ACT INHALER    Inhale 2 puffs into the lungs in the morning and at bedtime.   CARBINOXAMINE MALEATE 4 MG TABS    Take 2 tablets (8 mg total) by mouth 2 (two) times daily.   CHOLECALCIFEROL (VITAMIN D3) 25 MCG (1000 UNIT) TABLET    Take 2,000 Units by mouth daily.   CYCLOSPORINE (RESTASIS) 0.05 % OPHTHALMIC EMULSION    Place 1 drop into both eyes 2 times daily.   EPINEPHRINE 0.3 MG/0.3 ML IJ SOAJ INJECTION    Inject 0.3 mg into the muscle as needed for anaphylaxis.   FAMOTIDINE (PEPCID) 20 MG TABLET    Take 20 mg by mouth 2 (two) times daily.   FLUTICASONE (FLONASE) 50 MCG/ACT NASAL SPRAY    Place 2 sprays into both nostrils daily.   METOPROLOL SUCCINATE (TOPROL-XL) 25 MG 24 HR TABLET  Take 1 tablet (25 mg total) by mouth daily.   MONTELUKAST (SINGULAIR) 10 MG TABLET    Take 1 tablet (10 mg total) by mouth daily.   MULTIPLE VITAMIN (MULTIVITAMIN WITH MINERALS) TABS TABLET    Take 1 tablet by mouth daily.   ONDANSETRON (ZOFRAN-ODT) 4 MG DISINTEGRATING TABLET    Take 1 tablet (4 mg total) by mouth every 8 (eight) hours as needed for nausea or vomiting.   PANTOPRAZOLE (PROTONIX) 40 MG TABLET    Take 1 tablet (40 mg total) by mouth daily.   PRAVASTATIN (PRAVACHOL) 20 MG TABLET    Take 1 tablet (20 mg total) by mouth every evening.   TIRZEPATIDE (MOUNJARO) 2.5 MG/0.5ML PEN    Inject 2.5 mg into the skin once a week.   TRUE METRIX BLOOD GLUCOSE TEST TEST STRIP    1 each by Other route 3 (three) times daily.   TRUEPLUS LANCETS 33G MISC    Apply 1 each topically 3 (three) times daily.  Modified Medications   No medications on file  Discontinued Medications   FLUCONAZOLE (DIFLUCAN) 100 MG TABLET    Take 1 tablet (100 mg total) by mouth daily. Take 1 tablet by mouth now repeat in 5 days    Allergies Allergies  Allergen Reactions   Crab [Shellfish Allergy] Shortness Of Breath and Swelling   Erythromycin Swelling and Rash   Hm Lidocaine Patch [Lidocaine]     Burning on skin   Erdafitinib     Other reaction(s): pt not sure   Fish Allergy     Swelling    Metoclopramide Other (See Comments)    Slurred speech and unable to move muscles,  Caused her to drag her feet   Sulfacetamide     Other reaction(s): itching, rash   Sulfacetamide Sodium-Sulfur Other (See Comments)    Other reaction(s): itching, rash   Doxycycline     GI Intolerance   Levaquin [Levofloxacin In D5w] Other (See Comments)    Causes irregular heart beat   Morphine Nausea And Vomiting and Rash    Irregular heart beat   Sulfa Antibiotics Rash    Past Medical History Past Medical History:  Diagnosis Date   Arthritis    Asthma    Carotid stenosis, bilateral    Carpal tunnel syndrome    Chronic low back  pain    COPD (chronic obstructive pulmonary disease) (HCC)    Diabetes mellitus without complication (HCC)    type II    Diabetic neuropathy (HCC)    Diverticulitis    Dyspnea    mild    Frequent falls    GERD (gastroesophageal reflux disease)    History of bronchitis    Hypercholesterolemia    Hypertension    IBS (irritable bowel syndrome)    Osteoarthritis    knee   Palpitations    Right ventricular enlargement    per pt report   Sleep apnea    mild but no sleep apnea    TMJ (dislocation of temporomandibular joint)    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    Vitamin D deficiency     Past Surgical History Past Surgical History:  Procedure Laterality Date   ABDOMINAL HYSTERECTOMY  1993   CARPAL TUNNEL RELEASE Right    CERVICAL DISC SURGERY  04/29/2015   [place]   ESOPHAGEAL MANOMETRY N/A 03/03/2021   Procedure: ESOPHAGEAL MANOMETRY (EM);  Surgeon: Charlott Rakes, MD;  Location: [facility];  Service: Endoscopy;  Laterality: N/A;   ESOPHAGOGASTRODUODENOSCOPY ENDOSCOPY     with esophagus being stretched twice per pt,    EYE SURGERY     fatty 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      feeding tube placement and removal   2015   NASAL SEPTUM SURGERY     ROTATOR CUFF REPAIR Right    spurs removed from esophagus   2015   TONSILLECTOMY      Family History family history includes Allergic rhinitis in her father, mother, and sister; Collagen disease in her sister; Diabetes in her father; Heart disease in her brother and father; Hypertension in her brother and father.  Social History Social History   Socioeconomic History   Marital status: Widowed    Spouse name: Not on file   Number of children: 3   Years of education: Not on file   Highest education level: 11th grade  Occupational History    Comment: NA  Tobacco Use   Smoking status: Former    Current packs/day: 0.00    Average packs/day: 0.5 packs/day for 30.0 years (15.0 ttl pk-yrs)    Types:  Cigarettes    Start date: 12/08/1964    Quit date: 12/08/1994    Years since quitting: 29.2   Smokeless tobacco: Never  Vaping Use   Vaping status: Never Used  Substance and Sexual Activity   Alcohol use: No   Drug use: No   Sexual activity: Not Currently  Other Topics Concern   Not on file  Social History Narrative   Lives with daughter   Caffeine- coffee 12 oz   Social Drivers of Corporate investment banker Strain: Low Risk  (02/09/2024)   Overall Financial Resource Strain (CARDIA)    Difficulty of Paying Living Expenses: Not very hard  Food Insecurity: Food Insecurity Present (02/09/2024)   Hunger Vital Sign    Worried About Running Out of Food in the Last Year: Never true    Ran Out of Food in the Last Year: Sometimes true  Transportation Needs: No Transportation Needs (02/09/2024)   PRAPARE - Administrator, Civil Service (Medical): No    Lack of Transportation (Non-Medical): No  Physical Activity: Sufficiently Active (02/09/2024)   Exercise Vital Sign    Days of Exercise per Week: 4 days    Minutes of Exercise per Session: 40 min  Stress: No Stress Concern Present (02/09/2024)   [employer] of Occupational Health - Occupational Stress Questionnaire    Feeling of Stress : Not at all  Social Connections: Moderately Integrated (02/09/2024)   Social Connection and Isolation Panel [NHANES]    Frequency of Communication with Friends and Family: Twice a week    Frequency of Social Gatherings with Friends and Family: Twice a week    Attends Religious Services: More than 4 times per year    Active Member of [employer] or Organizations: Yes    Attends Banker Meetings: More than 4 times per year    Marital Status: Widowed  Intimate Partner Violence: Not At Risk (05/25/2023)   Humiliation, Afraid, Rape, and Kick questionnaire    Fear of Current or Ex-Partner: No    Emotionally Abused: No    Physically Abused: No    Sexually Abused: No    Lab Results  Component  Value Date   HGBA1C 9.1 (H) 01/15/2024   HGBA1C 7.2 (H) 08/21/2023   HGBA1C 7.1 (H) 04/06/2023   Lab Results  Component Value Date   CHOL 138 01/15/2024  Lab Results  Component Value Date   HDL 54 01/15/2024   Lab Results  Component Value Date   LDLCALC 60 01/15/2024   Lab Results  Component Value Date   TRIG 158 (H) 01/15/2024   Lab Results  Component Value Date   CHOLHDL 2.6 01/15/2024   Lab Results  Component Value Date   CREATININE 0.76 03/02/2024   Lab Results  Component Value Date   GFR 89.00 09/01/2016   Lab Results  Component Value Date   MICROALBUR 0.7 01/15/2024      Component Value Date/Time   NA 141 03/02/2024 0943   NA 146 (H) 08/21/2023 1156   K 3.9 03/02/2024 0943   CL 110 03/02/2024 0943   CO2 23 03/02/2024 0943   GLUCOSE 175 (H) 03/02/2024 0943   BUN 7 (L) 03/02/2024 0943   BUN 9 08/21/2023 1156   CREATININE 0.76 03/02/2024 0943   CREATININE 0.98 01/15/2024 1237   CALCIUM 9.4 03/02/2024 0943   PROT 6.5 03/02/2024 0943   PROT 6.6 08/21/2023 1156   ALBUMIN 3.5 03/02/2024 0943   ALBUMIN 4.2 08/21/2023 1156   AST 19 03/02/2024 0943   ALT 21 03/02/2024 0943   ALKPHOS 72 03/02/2024 0943   BILITOT 0.7 03/02/2024 0943   BILITOT 0.6 08/21/2023 1156   GFRNONAA >60 03/02/2024 0943   GFRAA >60 07/08/2020 2000      Latest Ref Rng & Units 03/02/2024    9:43 AM 01/15/2024   12:37 PM 08/21/2023   11:56 AM  BMP  Glucose 70 - 99 mg/dL 409  811  914   BUN 8 - 23 mg/dL 7  10  9    Creatinine 0.44 - 1.00 mg/dL 7.82  9.56  2.13   BUN/Creat Ratio 6 - 22 (calc)  SEE NOTE:  11   Sodium 135 - 145 mmol/L 141  139  146   Potassium 3.5 - 5.1 mmol/L 3.9  4.1  5.0   Chloride 98 - 111 mmol/L 110  103  107   CO2 22 - 32 mmol/L 23  27  25    Calcium 8.9 - 10.3 mg/dL 9.4  9.3  9.6        Component Value Date/Time   WBC 4.8 03/02/2024 0943   RBC 5.56 (H) 03/02/2024 0943   HGB 16.1 (H) 03/02/2024 0943   HGB 14.6 08/21/2023 1156   HCT 49.6 (H) 03/02/2024  0943   HCT 46.4 08/21/2023 1156   PLT 297 03/02/2024 0943   PLT 290 08/21/2023 1156   MCV 89.2 03/02/2024 0943   MCV 90 08/21/2023 1156   MCH 29.0 03/02/2024 0943   MCHC 32.5 03/02/2024 0943   RDW 13.4 03/02/2024 0943   RDW 13.2 08/21/2023 1156   LYMPHSABS 1.6 03/02/2024 0943   LYMPHSABS 1.7 08/21/2023 1156   MONOABS 0.5 03/02/2024 0943   EOSABS 0.0 03/02/2024 0943   EOSABS 0.1 08/21/2023 1156   BASOSABS 0.0 03/02/2024 0943   BASOSABS 0.0 08/21/2023 1156     Parts of this note may have been dictated using voice recognition software. There may be variances in spelling and vocabulary which are unintentional. Not all errors are proofread. Please notify the Thereasa Parkin if any discrepancies are noted or if the meaning of any statement is not clear.

## 2024-03-15 ENCOUNTER — Other Ambulatory Visit: Payer: Self-pay

## 2024-03-15 DIAGNOSIS — E1165 Type 2 diabetes mellitus with hyperglycemia: Secondary | ICD-10-CM

## 2024-03-15 MED ORDER — PEN NEEDLES 32G X 4 MM MISC: 1.0000 | Freq: Every day | 2 refills | Status: DC

## 2024-03-31 ENCOUNTER — Encounter (HOSPITAL_COMMUNITY): Payer: Self-pay | Admitting: *Deleted

## 2024-03-31 ENCOUNTER — Ambulatory Visit (HOSPITAL_COMMUNITY)
Admission: EM | Admit: 2024-03-31 | Discharge: 2024-03-31 | Disposition: A | Discharge status: Home / Self Care | Attending: Family Medicine | Admitting: Family Medicine

## 2024-03-31 ENCOUNTER — Other Ambulatory Visit: Payer: Self-pay

## 2024-03-31 DIAGNOSIS — K2900 Acute gastritis without bleeding: Secondary | ICD-10-CM

## 2024-03-31 DIAGNOSIS — K219 Gastro-esophageal reflux disease without esophagitis: Secondary | ICD-10-CM

## 2024-03-31 MED ORDER — ALUM & MAG HYDROXIDE-SIMETH 200-200-20 MG/5ML PO SUSP
30.0000 mL | Freq: Once | ORAL | Status: AC
  Administered 2024-03-31: 30 mL via ORAL

## 2024-03-31 MED ORDER — LIDOCAINE VISCOUS HCL 2 % MT SOLN: 15.0000 mL | OROMUCOSAL | 1 refills | Status: DC | PRN

## 2024-03-31 MED ORDER — ALUM & MAG HYDROXIDE-SIMETH 200-200-20 MG/5ML PO SUSP
ORAL | Status: AC
Start: 2024-03-31 — End: ?
  Filled 2024-03-31: qty 30

## 2024-03-31 MED ORDER — LIDOCAINE VISCOUS HCL 2 % MT SOLN
15.0000 mL | Freq: Once | OROMUCOSAL | Status: AC
  Administered 2024-03-31: 15 mL via OROMUCOSAL

## 2024-03-31 MED ORDER — LIDOCAINE VISCOUS HCL 2 % MT SOLN
OROMUCOSAL | Status: AC
  Filled 2024-03-31: qty 15

## 2024-03-31 NOTE — Discharge Instructions (Addendum)
 Symptoms, physical exam findings and response to treatment with GI cocktail are most consistent with a flareup of gastroesophageal reflux disease and possibly a mild gastritis.  This likely was flared by the recent episode of food poisoning that was experienced by herself and her daughter.  Symptoms have improved with treatment here and we will plan to send her home with the following: Mix Lidocaine  15 mLs with Maalox/mylanta 15-20 mLs together and drink. Avoid eating or drinking for about 30-45 minutes as this will numb your throat as well. Can do this every 4-6 hours as needed for acid reflux flare.  Continue taking other medications as prescribed.  Stick to a bland diet, avoid spicy foods, greasy foods, red meats, or foods that can cause reflux like tomato based products.  For the left shoulder, recommend following up with the orthopedist that you have been seeing. Can alternate heat and ice to help with the pain.  If symptoms seem to worsen or fail to resolve with using the lidocaine  and Maalox then recommend further evaluation at the emergency room

## 2024-03-31 NOTE — ED Triage Notes (Signed)
 PT reports she ate some bad fast food on SAT 4-19 and has had CP since then.  Pt also has tendonitis pain in LT shoulder

## 2024-03-31 NOTE — ED Provider Notes (Signed)
 [facility]    CSN: [id number] Arrival date & time: 03/31/24  1651      History   Chief Complaint Chief Complaint  Patient presents with   Chest Pain    HPI Pamela Roberson is a 68 y.o. female.   68 year old female who presents urgent care with complaints of chest pain, burning, burping, left arm pain with tingling.  She reports that on the 19th she had some food that she feels was bad. She reports eating a greek salad that looked brown and had been sitting out.  Initially she had symptoms of food poisoning with n/v and diarrhea which has resolved but 3 days ago she began having chest pain.  The pain was in the center of her chest with a pressure that has turned more to a burning type sensation.  She is also having a lot of burping.  A day after she started having chest pain she began having pain in the left arm with tingling.  She has a history of having right shoulder impingement and knows that she has some arthritis in the left shoulder but this was a new onset of pain with tingling that she had not had previously. The tingling has resolved. She is tender to palpitation around the posterior shoulder and the shoulder pain increases with movement. She has been taking medications to help with acid reflux but reports nothing is helping her chest symptoms.She has a history of acid reflux and has had to have GI cocktails in the past. She takes pepcid  in the morning and prevacid  at night. Her symptoms are very similar to the flare ups she has had previously. She does relate that when she bends forward she has been getting dizzy in the last several days but had diarrhea for several days prior which has resolved now.  She denies any abdominal pain, nausea, vomiting, fevers, chills   Chest Pain Associated symptoms: dizziness   Associated symptoms: no abdominal pain, no back pain, no cough, no fever, no palpitations, no shortness of breath and no vomiting     Past Medical History:   Diagnosis Date   Arthritis    Asthma    Carotid stenosis, bilateral    Carpal tunnel syndrome    Chronic low back pain    COPD (chronic obstructive pulmonary disease) (HCC)    Diabetes mellitus without complication (HCC)    type II    Diabetic neuropathy (HCC)    Diverticulitis    Dyspnea    mild    Frequent falls    GERD (gastroesophageal reflux disease)    History of bronchitis    Hypercholesterolemia    Hypertension    IBS (irritable bowel syndrome)    Osteoarthritis    knee   Palpitations    Right ventricular enlargement    per pt report   Sleep apnea    mild but no sleep apnea    TMJ (dislocation of temporomandibular joint)    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    Vitamin D deficiency     Patient Active Problem List   Diagnosis Date Noted   Primary hypertension 02/13/2024   Diabetes mellitus with coincident hypertension (HCC) 02/13/2024   Class 1 obesity due to excess calories with body mass index (BMI) of 33.0 to 33.9 in adult 02/13/2024   Chronic maxillary sinusitis 01/30/2024   Epistaxis 12/25/2023   Hypertrophy of nasal turbinates 12/25/2023   COVID-19 vaccination declined 10/24/2023   Need for  influenza vaccination 10/24/2023   Class 1 obesity without serious comorbidity with body mass index (BMI) of 30.0 to 30.9 in adult 10/24/2023   Encounter for annual health examination 08/31/2023   Type 2 diabetes mellitus with diabetic neuropathy, without long-term current use of insulin  (HCC) 08/31/2023   Estrogen deficiency 08/31/2023   Herpes zoster vaccination declined 08/31/2023   Influenza vaccination declined 08/31/2023   Post-nasal drip 08/31/2023   Muscle weakness (generalized) 08/31/2023   Degeneration of lumbar intervertebral disc 07/26/2023   Diabetic peripheral neuropathy (HCC) 07/26/2023   Left leg weakness 07/26/2023   Lumbar radiculopathy 07/26/2023   Pain in left knee 02/15/2022   Sciatica, left side 02/15/2022   Pain in left shoulder  11/16/2021   Internal hemorrhoids 11/03/2021   Leg cramps 11/03/2021   Myalgia 11/03/2021   Adenomatous polyp of colon 12/04/2020   Anal fissure 12/04/2020   Chronic idiopathic constipation 12/04/2020   Constipation 12/04/2020   Dysphagia 12/04/2020   Gastroesophageal reflux disease 12/04/2020   Rectal pain 12/04/2020   Right upper quadrant pain 12/04/2020   Dizzy 12/11/2017   Pharyngeal dysphagia 12/11/2017   Referred ear pain, bilateral 12/11/2017   Diabetes mellitus without complication (HCC) 06/29/2017   Family history of glaucoma 06/29/2017   Hyperopia of both eyes with astigmatism and presbyopia 06/29/2017   Keratoconjunctivitis sicca of both eyes not specified as Sjogren's 06/29/2017   Nuclear sclerotic cataract of both eyes 06/29/2017   Vitreous floater, bilateral 06/29/2017   Biceps tendinitis of right shoulder 04/05/2017   Impingement syndrome of right shoulder 04/05/2017   Arthritis of right acromioclavicular joint 04/05/2017   Partial nontraumatic tear of rotator cuff, right 04/05/2017    Past Surgical History:  Procedure Laterality Date   ABDOMINAL HYSTERECTOMY  1993   CARPAL TUNNEL RELEASE Right    CERVICAL DISC SURGERY  04/29/2015   Dr [place]yn [place]   ESOPHAGEAL MANOMETRY N/A 03/03/2021   Procedure: ESOPHAGEAL MANOMETRY (EM);  Surgeon: Baldo Bonds, MD;  Location: [facility];  Service: Endoscopy;  Laterality: N/A;   ESOPHAGOGASTRODUODENOSCOPY ENDOSCOPY     with esophagus being stretched twice per pt,    EYE SURGERY     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      Home Medications    Prior to Admission medications   Medication Sig Start Date End Date Taking? Authorizing Provider  acyclovir  (ZOVIRAX ) 400 MG tablet Take 1 tablet (400 mg  total) by mouth 2 (two) times daily. 02/13/24  Yes Susanna Epley, FNP  albuterol  (PROVENTIL ) (2.5 MG/3ML) 0.083% nebulizer solution Take 3 mLs (2.5 mg total) by nebulization every 4 (four) hours as needed for wheezing or shortness of breath. 12/14/23  Yes Padgett, Rhoderick Ceo, MD  albuterol  (VENTOLIN  HFA) 108 [phone] Base) MCG/ACT inhaler Inhale 2 puffs into the lungs every 4 (four) hours as needed for wheezing or shortness of breath. 12/14/23  Yes Padgett, Rhoderick Ceo, MD  amLODipine  (NORVASC ) 5 MG tablet Take 1 tablet (5 mg total) by mouth daily. 10/10/23  Yes Tobb, Kardie, DO  ascorbic acid (VITAMIN C) 1000 MG tablet Take 1 tablet by mouth daily.   Yes [provider]  azelastine  (OPTIVAR ) 0.05 % ophthalmic solution Place 1  drop into both eyes 2 (two) times daily. 12/14/23  Yes Padgett, Rhoderick Ceo, MD  budesonide -formoterol  (SYMBICORT ) 80-4.5 MCG/ACT inhaler Inhale 2 puffs into the lungs in the morning and at bedtime. 10/23/23  Yes Kandice Orleans, MD  Carbinoxamine  Maleate 4 MG TABS Take 2 tablets (8 mg total) by mouth 2 (two) times daily. 12/14/23  Yes Padgett, Rhoderick Ceo, MD  cholecalciferol (VITAMIN D3) 25 MCG (1000 UNIT) tablet Take 2,000 Units by mouth daily.   Yes [provider]  cycloSPORINE (RESTASIS) 0.05 % ophthalmic emulsion Place 1 drop into both eyes 2 times daily. 03/08/22  Yes [provider]  famotidine  (PEPCID ) 20 MG tablet Take 20 mg by mouth 2 (two) times daily.   Yes [provider]  fluticasone  (FLONASE ) 50 MCG/ACT nasal spray Place 2 sprays into both nostrils daily. 12/25/23  Yes Reynold Caves, MD  insulin  glargine (LANTUS ) 100 UNIT/ML Solostar Pen Inject 10 Units into the skin daily. 03/14/24 06/12/24 Yes Motwani, Komal, MD  metFORMIN  (GLUCOPHAGE -XR) 500 MG 24 hr tablet Take 1 tablet (500 mg total) by mouth 2 (two) times daily with a meal. 03/14/24  Yes Motwani, Komal, MD  metoprolol  succinate (TOPROL -XL) 25 MG 24 hr tablet Take 1 tablet (25 mg  total) by mouth daily. 10/10/23  Yes Tobb, Kardie, DO  montelukast  (SINGULAIR ) 10 MG tablet Take 1 tablet (10 mg total) by mouth daily. 12/14/23  Yes Padgett, Rhoderick Ceo, MD  Multiple Vitamin (MULTIVITAMIN WITH MINERALS) TABS tablet Take 1 tablet by mouth daily.   Yes [provider]  ondansetron  (ZOFRAN -ODT) 4 MG disintegrating tablet Take 1 tablet (4 mg total) by mouth every 8 (eight) hours as needed for nausea or vomiting. 11/26/23  Yes Godfrey Tritschler A, PA-C  pantoprazole  (PROTONIX ) 40 MG tablet Take 1 tablet (40 mg total) by mouth daily. 12/14/23  Yes Padgett, Rhoderick Ceo, MD  pravastatin  (PRAVACHOL ) 20 MG tablet Take 1 tablet (20 mg total) by mouth every evening. 10/10/23 10/09/24 Yes Tobb, Kardie, DO  Artificial Tear Ointment (DRY EYES OP) Place 1-2 drops into both eyes daily as needed (for dry eyes).     [provider]  Blood Glucose Calibration (TRUE METRIX LEVEL 1) Low SOLN  09/20/21   [provider]  Blood Glucose Monitoring Suppl (TRUE METRIX METER) w/Device KIT  09/20/21   [provider]  Blood Glucose Monitoring Suppl DEVI 1 each by Does not apply route in the morning, at noon, and at bedtime. May substitute to any manufacturer covered by patient's insurance. 03/14/24   Motwani, Komal, MD  Continuous Glucose Receiver (DEXCOM G7 RECEIVER) DEVI 1 Device by Does not apply route continuous. 03/14/24   Motwani, Komal, MD  Continuous Glucose Sensor (DEXCOM G7 SENSOR) MISC 1 Device by Does not apply route continuous. 03/14/24   Motwani, Komal, MD  EPINEPHrine  0.3 mg/0.3 mL IJ SOAJ injection Inject 0.3 mg into the muscle as needed for anaphylaxis. 12/14/23   Brian Campanile, MD  Glucose Blood (BLOOD GLUCOSE TEST STRIPS) STRP 1 each by In Vitro route in the morning, at noon, and at bedtime. May substitute to any manufacturer covered by patient's insurance. 03/14/24 07/25/24  Motwani, Komal, MD  Insulin  Pen Needle (PEN NEEDLES) 32G X 4 MM MISC 1  Device by Does not apply route daily. 03/15/24   Motwani, Komal, MD  Lancet Device MISC 1 each by Does not apply route in the morning, at noon, and at bedtime. May substitute to any manufacturer covered by patient's insurance. 03/14/24 04/13/24  Jorge Newcomer, MD  Lancets Misc. MISC 1 each by Does not apply route in the morning, at noon, and at bedtime. May substitute to any manufacturer covered by patient's insurance. 03/14/24 04/13/24  Motwani, Komal, MD  tirzepatide [facility]) 2.5 MG/0.5ML Pen Inject 2.5 mg into the skin once a week. Patient not taking: Reported on 03/14/2024 02/13/24   Susanna Epley, FNP  TRUE METRIX BLOOD GLUCOSE TEST test strip 1 each by Other route 3 (three) times daily. Patient not taking: Reported on 03/14/2024 02/13/24   Susanna Epley, FNP  TRUEplus Lancets 33G MISC Apply 1 each topically 3 (three) times daily. Patient not taking: Reported on 03/14/2024 02/13/24   Susanna Epley, FNP  esomeprazole  (NEXIUM ) 40 MG capsule Take 1 capsule (40 mg total) by mouth daily. Patient not taking: Reported on 07/08/2020 04/24/19 08/06/20  Wynetta Heckle, MD    Family History Family History  Problem Relation Age of Onset   Allergic rhinitis Mother    Diabetes Father        type 2   Heart disease Father    Hypertension Father    Allergic rhinitis Father    Allergic rhinitis Sister    Collagen disease Sister    Heart disease Brother    Hypertension Brother    Angioedema Neg Hx    Asthma Neg Hx    Atopy Neg Hx    Eczema Neg Hx    Immunodeficiency Neg Hx    Urticaria Neg Hx     Social History Social History   Tobacco Use   Smoking status: Former    Current packs/day: 0.00    Average packs/day: 0.5 packs/day for 30.0 years (15.0 ttl pk-yrs)    Types: Cigarettes    Start date: 12/08/1964    Quit date: 12/08/1994    Years since quitting: 29.3   Smokeless tobacco: Never  Vaping Use   Vaping status: Never Used  Substance Use Topics   Alcohol use: No   Drug use: No     Allergies    Crab [shellfish allergy], Erythromycin, Hm lidocaine  patch [lidocaine ], Erdafitinib, Fish allergy, Metoclopramide, Sulfacetamide, Sulfacetamide sodium-sulfur, Doxycycline , Levaquin [levofloxacin in d5w], Morphine, and Sulfa antibiotics   Review of Systems Review of Systems  Constitutional:  Negative for chills and fever.  HENT:  Negative for ear pain and sore throat.   Eyes:  Negative for pain and visual disturbance.  Respiratory:  Negative for cough and shortness of breath.   Cardiovascular:  Positive for chest pain. Negative for palpitations.  Gastrointestinal:  Negative for abdominal pain and vomiting.  Genitourinary:  Negative for dysuria and hematuria.  Musculoskeletal:  Negative for arthralgias and back pain.       Left arm pain  Skin:  Negative for color change and rash.  Neurological:  Positive for dizziness. Negative for seizures and syncope.  All other systems reviewed and are negative.    Physical Exam Triage Vital Signs ED Triage Vitals [03/31/24 1735]  Encounter Vitals Group     BP 135/71     Systolic BP Percentile      Diastolic BP Percentile      Pulse Rate 100     Resp 20     Temp 98.3 F (36.8 C)     Temp src      SpO2 100 %     Weight      Height      Head Circumference      Peak Flow      Pain Score 9  Pain Loc      Pain Education      Exclude from Growth Chart    No data found.  Updated Vital Signs BP 135/71   Pulse 100   Temp 98.3 F (36.8 C)   Resp 20   SpO2 100%   Visual Acuity Right Eye Distance:   Left Eye Distance:   Bilateral Distance:    Right Eye Near:   Left Eye Near:    Bilateral Near:     Physical Exam Vitals and nursing note reviewed.  Constitutional:      General: She is not in acute distress.    Appearance: She is well-developed.  HENT:     Head: Normocephalic and atraumatic.  Eyes:     Conjunctiva/sclera: Conjunctivae normal.  Cardiovascular:     Rate and Rhythm: Normal rate and regular rhythm.     Heart  sounds: No murmur heard. Pulmonary:     Effort: Pulmonary effort is normal. No respiratory distress.     Breath sounds: Normal breath sounds.  Abdominal:     Palpations: Abdomen is soft.     Tenderness: There is no abdominal tenderness.  Musculoskeletal:        General: No swelling.     Cervical back: Neck supple.  Skin:    General: Skin is warm and dry.     Capillary Refill: Capillary refill takes less than 2 seconds.  Neurological:     Mental Status: She is alert.  Psychiatric:        Mood and Affect: Mood normal.      UC Treatments / Results  Labs (all labs ordered are listed, but only abnormal results are displayed) Labs Reviewed - No data to display  EKG   Radiology No results found.  Procedures Procedures (including critical care time)  Medications Ordered in UC Medications - No data to display  Initial Impression / Assessment and Plan / UC Course  I have reviewed the triage vital signs and the nursing notes.  Pertinent labs & imaging results that were available during my care of the patient were reviewed by me and considered in my medical decision making (see chart for details).     Acute superficial gastritis without hemorrhage  Gastroesophageal reflux disease without esophagitis   Symptoms, physical exam findings and response to treatment with GI cocktail are most consistent with a flareup of gastroesophageal reflux disease and possibly a mild gastritis.  This likely was flared by the recent episode of food poisoning that was experienced by herself and her daughter.  Symptoms have improved with treatment here and we will plan to send her home with the following: Mix Lidocaine  15 mLs with Maalox/mylanta 15-20 mLs together and drink. Avoid eating or drinking for about 30-45 minutes as this will numb your throat as well. Can do this every 4-6 hours as needed for acid reflux flare.  Continue taking other medications as prescribed.  Stick to a bland diet, avoid  spicy foods, greasy foods, red meats, or foods that can cause reflux like tomato based products.  For the left shoulder, recommend following up with the orthopedist that you have been seeing. Can alternate heat and ice to help with the pain.  If symptoms seem to worsen or fail to resolve with using the lidocaine  and Maalox then recommend further evaluation at the emergency room  Final Clinical Impressions(s) / UC Diagnoses   Final diagnoses:  None   Discharge Instructions   None    ED Prescriptions  None    PDMP not reviewed this encounter.   Kreg Pesa, [place] 03/31/24 1849

## 2024-04-09 ENCOUNTER — Ambulatory Visit (INDEPENDENT_AMBULATORY_CARE_PROVIDER_SITE_OTHER): Payer: 59 | Admitting: Podiatry

## 2024-04-09 ENCOUNTER — Other Ambulatory Visit: Payer: Self-pay | Admitting: Allergy

## 2024-04-09 ENCOUNTER — Encounter: Payer: Self-pay | Admitting: Podiatry

## 2024-04-09 VITALS — Ht 66.0 in | Wt 202.8 lb

## 2024-04-09 DIAGNOSIS — L84 Corns and callosities: Secondary | ICD-10-CM

## 2024-04-09 DIAGNOSIS — M79674 Pain in right toe(s): Secondary | ICD-10-CM

## 2024-04-09 DIAGNOSIS — B351 Tinea unguium: Secondary | ICD-10-CM | POA: Diagnosis not present

## 2024-04-09 DIAGNOSIS — M79675 Pain in left toe(s): Secondary | ICD-10-CM | POA: Diagnosis not present

## 2024-04-09 DIAGNOSIS — E1142 Type 2 diabetes mellitus with diabetic polyneuropathy: Secondary | ICD-10-CM

## 2024-04-14 ENCOUNTER — Encounter: Payer: Self-pay | Admitting: Podiatry

## 2024-04-14 NOTE — Progress Notes (Signed)
 Subjective:  Patient ID: Pamela Roberson, female    DOB: July 08, 1956,  MRN: [id number]  Pamela Roberson presents to clinic today for at risk foot care with history of diabetic neuropathy and callus(es) of both feet and painful thick toenails that are difficult to trim. Painful toenails interfere with ambulation. Aggravating factors include wearing enclosed shoe gear. Pain is relieved with periodic professional debridement. Painful calluses are aggravated when weightbearing with and without shoegear. Pain is relieved with periodic professional debridement.  Chief Complaint  Patient presents with   Nail Problem    Pt is here for [facility] last A1C was 9.1 PCP is Dr Sulema Endo and LOV was in March.   New problem(s): None.   PCP is Susanna Epley, FNP.  Allergies  Allergen Reactions   Crab [Shellfish Allergy] Shortness Of Breath and Swelling   Erythromycin Swelling and Rash   Hm Lidocaine  Patch [Lidocaine ]     Burning on skin   Erdafitinib     Other reaction(s): pt not sure   Fish Allergy     Swelling    Metoclopramide Other (See Comments)    Slurred speech and unable to move muscles,  Caused her to drag her feet   Sulfacetamide     Other reaction(s): itching, rash   Sulfacetamide Sodium-Sulfur Other (See Comments)    Other reaction(s): itching, rash   Doxycycline      GI Intolerance   Levaquin [Levofloxacin In D5w] Other (See Comments)    Causes irregular heart beat   Morphine Nausea And Vomiting and Rash    Irregular heart beat   Sulfa Antibiotics Rash    Review of Systems: Negative except as noted in the HPI.  Objective: No changes noted in today's physical examination. There were no vitals filed for this visit. Pamela Roberson is a pleasant 68 y.o. female in NAD. AAO x 3.  Vascular Examination: Capillary refill time immediate b/l. Vascular status intact b/l with palpable DP pulses; faintly palpable PT pulses. Pedal hair diminished b/l. No edema. No pain with calf  compression b/l. Skin temperature gradient WNL b/l. No cyanosis or clubbing noted b/l. No ischemia or gangrene b/l.   Neurological Examination:  Pt has subjective symptoms of neuropathy. Protective sensation intact 5/5 intact bilaterally with 10g monofilament b/l.  Dermatological Examination: Pedal skin with normal turgor, texture and tone b/l.  No open wounds. No interdigital macerations.   Toenails 1-5 b/l thick, discolored, elongated with subungual debris and pain on dorsal palpation.   Hyperkeratotic lesion(s) submet head 1 left foot, submet head 5 b/l, and sub 5th met base right lower extremity.  No erythema, no edema, no drainage, no fluctuance.  Musculoskeletal Examination: Muscle strength 5/5 to all lower extremity muscle groups bilaterally. 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  Radiographs: None  Last A1c:      Latest Ref Rng & Units 01/15/2024   12:37 PM 08/21/2023   11:56 AM  Hemoglobin A1C  Hemoglobin-A1c <5.7 % of total Hgb 9.1  7.2    Assessment/Plan: 1. Pain due to onychomycosis of toenails of both feet   2. Callus   3. Diabetic peripheral neuropathy associated with type 2 diabetes mellitus [facility])    Consent given for treatment. Patient examined.All patient's and/or POA's questions/concerns addressed on today's visit. Toenails 1-5 debrided in length and girth without incident. Callus(es) submet head 1 left foot, submet head 5 b/l, and sub 5th met base right lower extremity pared with sharp debridement without  incident. Continue foot and shoe inspections daily. Monitor blood glucose per PCP/Endocrinologist's recommendations.Continue soft, supportive shoe gear daily. Report any pedal injuries to medical professional. Call office if there are any questions/concerns. -Patient/POA to call should there be question/concern in the interim.   Return in about 3 months (around 07/10/2024).  Pamela Roberson, DPM      [place]  LOCATION: [address] [place] [address]                   Office [phone]   [facility] LOCATION: [address] [place], [place] [address] Office [phone]

## 2024-04-16 ENCOUNTER — Encounter: Payer: Self-pay | Admitting: "Endocrinology

## 2024-04-16 ENCOUNTER — Ambulatory Visit (INDEPENDENT_AMBULATORY_CARE_PROVIDER_SITE_OTHER): Admitting: "Endocrinology

## 2024-04-16 VITALS — BP 130/80 | HR 96 | Ht 66.0 in | Wt 203.0 lb

## 2024-04-16 DIAGNOSIS — E1165 Type 2 diabetes mellitus with hyperglycemia: Secondary | ICD-10-CM

## 2024-04-16 DIAGNOSIS — E7800 Pure hypercholesterolemia, unspecified: Secondary | ICD-10-CM

## 2024-04-16 DIAGNOSIS — G629 Polyneuropathy, unspecified: Secondary | ICD-10-CM | POA: Diagnosis not present

## 2024-04-16 DIAGNOSIS — Z794 Long term (current) use of insulin: Secondary | ICD-10-CM | POA: Diagnosis not present

## 2024-04-16 DIAGNOSIS — Z7984 Long term (current) use of oral hypoglycemic drugs: Secondary | ICD-10-CM

## 2024-04-16 LAB — POCT GLYCOSYLATED HEMOGLOBIN (HGB A1C): Hemoglobin A1C: 8.7 % — AB (ref 4.0–5.6)

## 2024-04-16 NOTE — Patient Instructions (Signed)

## 2024-04-16 NOTE — Progress Notes (Signed)
 Outpatient Endocrinology Note Pamela Newcomer, MD  04/16/24   Pamela Roberson 1956/09/30 [id number]  Referring Provider: Susanna Epley, FNP Primary Care Provider: Susanna Epley, FNP Reason for consultation: Subjective   Assessment & Plan  Diagnoses and all orders for this visit:  Uncontrolled type 2 diabetes mellitus with hyperglycemia (HCC) -     POCT glycosylated hemoglobin (Hb A1C)  Neuropathy  Long term (current) use of oral hypoglycemic drugs  Long-term insulin  use (HCC)  Pure hypercholesterolemia   Diabetes Type II complicated by neuropathy,  Lab Results  Component Value Date   GFR 89.00 09/01/2016   Hba1c goal less than 7, current Hba1c is  Lab Results  Component Value Date   HGBA1C 8.7 (A) 04/16/2024   Will recommend the following: Lantus  15 units every day  Metformin  XR 500mg , one pill with break fast and 2 pills with supper   Took mounjaro 2.5mg /week-stopped it due to constipation Took ozempic -stopped due to back rash  Regular metformin  caused GI S/E Reports groin rash improved now  Previously,  Ordered DexCom G7 Ordered DM education Ordered meter with supplies  No known contraindications/side effects to any of above medications Glucagon discussed and prescribed with refills on 04/16/24  -Last LD and Tg are as follows: Lab Results  Component Value Date   LDLCALC 60 01/15/2024    Lab Results  Component Value Date   TRIG 158 (H) 01/15/2024   -On pravastatin  20 mg QD -Follow low fat diet and exercise   -Blood pressure goal <140/90 - Microalbumin/creatinine goal is < 30 -Last MA/Cr is as follows: Lab Results  Component Value Date   MICROALBUR 0.7 01/15/2024   -not on ACE/ARB -diet changes including salt restriction -limit eating outside -counseled BP targets per standards of diabetes care -uncontrolled blood pressure can lead to retinopathy, nephropathy and cardiovascular and atherosclerotic heart disease  Reviewed and  counseled on: -A1C target -Blood sugar targets -Complications of uncontrolled diabetes  -Checking blood sugar before meals and bedtime and bring log next visit -All medications with mechanism of action and side effects -Hypoglycemia management: rule of 15's, Glucagon Emergency Kit and medical alert ID -low-carb low-fat plate-method diet -At least 20 minutes of physical activity per day -Annual dilated retinal eye exam and foot exam -compliance and follow up needs -follow up as scheduled or earlier if problem gets worse  Call if blood sugar is less than 70 or consistently above 250    Take a 15 gm snack of carbohydrate at bedtime before you go to sleep if your blood sugar is less than 100.    If you are going to fast after midnight for a test or procedure, ask your physician for instructions on how to reduce/decrease your insulin  dose.    Call if blood sugar is less than 70 or consistently above 250  -Treating a low sugar by rule of 15  (15 gms of sugar every 15 min until sugar is more than 70) If you feel your sugar is low, test your sugar to be sure If your sugar is low (less than 70), then take 15 grams of a fast acting Carbohydrate (3-4 glucose tablets or glucose gel or 4 ounces of juice or regular soda) Recheck your sugar 15 min after treating low to make sure it is more than 70 If sugar is still less than 70, treat again with 15 grams of carbohydrate          Don't drive the hour of hypoglycemia  If unconscious/unable  to eat or drink by mouth, use glucagon injection or nasal spray baqsimi and call 911. Can repeat again in 15 min if still unconscious.  Return in about 3 months (around 07/17/2024).   I have reviewed current medications, nurse's notes, allergies, vital signs, past medical and surgical history, family medical history, and social history for this encounter. Counseled patient on symptoms, examination findings, lab findings, imaging results, treatment decisions and  monitoring and prognosis. The patient understood the recommendations and agrees with the treatment plan. All questions regarding treatment plan were fully answered.  Pamela Newcomer, MD  04/16/24    History of Present Illness Pamela Roberson is a 68 y.o. year old female who presents for follow up of Type II diabetes mellitus.  Pamela Roberson was first diagnosed in 2005.   Diabetes education +  Home diabetes regimen: Lantus  10 units every day  Metformin  XR 500mg , one pill with break fast and 2 pills with supper   Took mounjaro 2.5mg /week-stopped it due to constipation Took ozempic -stopped due to back rash  Metformin  caused GI S/E Reports groin rash right now  COMPLICATIONS -  MI/Stroke -  retinopathy +  neuropathy -  nephropathy  SYMPTOMS REVIEWED - Polyuria - Weight loss - Blurred vision  BLOOD SUGAR DATA 163-237 range   Physical Exam  BP 130/80   Pulse 96   Ht 5\' 6"  (1.676 m)   Wt 203 lb (92.1 kg)   SpO2 98%   BMI 32.77 kg/m    Constitutional: well developed, well nourished Head: normocephalic, atraumatic Eyes: sclera anicteric, no redness Neck: supple Lungs: normal respiratory effort Neurology: alert and oriented Skin: dry, no appreciable rashes Musculoskeletal: no appreciable defects Psychiatric: normal mood and affect Diabetic Foot Exam - Simple   No data filed      Current Medications Patient's Medications  New Prescriptions   No medications on file  Previous Medications   ACYCLOVIR  (ZOVIRAX ) 400 MG TABLET    Take 1 tablet (400 mg total) by mouth 2 (two) times daily.   ALBUTEROL  (PROVENTIL ) (2.5 MG/3ML) 0.083% NEBULIZER SOLUTION    Take 3 mLs (2.5 mg total) by nebulization every 4 (four) hours as needed for wheezing or shortness of breath.   ALBUTEROL  (VENTOLIN  HFA) 108 (90 BASE) MCG/ACT INHALER    Inhale 2 puffs into the lungs every 4 (four) hours as needed for wheezing or shortness of breath.   AMLODIPINE  (NORVASC ) 5 MG TABLET     Take 1 tablet (5 mg total) by mouth daily.   ARTIFICIAL TEAR OINTMENT (DRY EYES OP)    Place 1-2 drops into both eyes daily as needed (for dry eyes).    ASCORBIC ACID (VITAMIN C) 1000 MG TABLET    Take 1 tablet by mouth daily.   AZELASTINE  (OPTIVAR ) 0.05 % OPHTHALMIC SOLUTION    Place 1 drop into both eyes 2 (two) times daily.   BLOOD GLUCOSE CALIBRATION (TRUE METRIX LEVEL 1) LOW SOLN       BLOOD GLUCOSE MONITORING SUPPL (TRUE METRIX METER) W/DEVICE KIT       BLOOD GLUCOSE MONITORING SUPPL DEVI    1 each by Does not apply route in the morning, at noon, and at bedtime. May substitute to any manufacturer covered by patient's insurance.   BUDESONIDE -FORMOTEROL  (SYMBICORT ) 80-4.5 MCG/ACT INHALER    Inhale 2 puffs into the lungs in the morning and at bedtime.   CARBINOXAMINE  MALEATE 4 MG TABS    Take 2 tablets (8 mg total) by mouth 2 (two)  times daily.   CHOLECALCIFEROL (VITAMIN D3) 25 MCG (1000 UNIT) TABLET    Take 2,000 Units by mouth daily.   CONTINUOUS GLUCOSE RECEIVER (DEXCOM G7 RECEIVER) DEVI    1 Device by Does not apply route continuous.   CONTINUOUS GLUCOSE SENSOR (DEXCOM G7 SENSOR) MISC    1 Device by Does not apply route continuous.   CYCLOSPORINE (RESTASIS) 0.05 % OPHTHALMIC EMULSION    Place 1 drop into both eyes 2 times daily.   EPINEPHRINE  0.3 MG/0.3 ML IJ SOAJ INJECTION    Inject 0.3 mg into the muscle as needed for anaphylaxis.   FAMOTIDINE  (PEPCID ) 20 MG TABLET    TAKE 1 TABLET BY MOUTH 1 TO 2 TIMES DAILY   FLUTICASONE  (FLONASE ) 50 MCG/ACT NASAL SPRAY    Place 2 sprays into both nostrils daily.   GLUCOSE BLOOD (BLOOD GLUCOSE TEST STRIPS) STRP    1 each by In Vitro route in the morning, at noon, and at bedtime. May substitute to any manufacturer covered by patient's insurance.   INSULIN  GLARGINE (LANTUS ) 100 UNIT/ML SOLOSTAR PEN    Inject 10 Units into the skin daily.   INSULIN  PEN NEEDLE (PEN NEEDLES) 32G X 4 MM MISC    1 Device by Does not apply route daily.   LIDOCAINE  (XYLOCAINE ) 2  % SOLUTION    Use as directed 15 mLs in the mouth or throat every 4 (four) hours as needed for mouth pain.   METFORMIN  (GLUCOPHAGE -XR) 500 MG 24 HR TABLET    Take 1 tablet (500 mg total) by mouth 2 (two) times daily with a meal.   METOPROLOL  SUCCINATE (TOPROL -XL) 25 MG 24 HR TABLET    Take 1 tablet (25 mg total) by mouth daily.   MONTELUKAST  (SINGULAIR ) 10 MG TABLET    Take 1 tablet (10 mg total) by mouth daily.   MULTIPLE VITAMIN (MULTIVITAMIN WITH MINERALS) TABS TABLET    Take 1 tablet by mouth daily.   ONDANSETRON  (ZOFRAN -ODT) 4 MG DISINTEGRATING TABLET    Take 1 tablet (4 mg total) by mouth every 8 (eight) hours as needed for nausea or vomiting.   PANTOPRAZOLE  (PROTONIX ) 40 MG TABLET    Take 1 tablet (40 mg total) by mouth daily.   PRAVASTATIN  (PRAVACHOL ) 20 MG TABLET    Take 1 tablet (20 mg total) by mouth every evening.   TIRZEPATIDE (MOUNJARO) 2.5 MG/0.5ML PEN    Inject 2.5 mg into the skin once a week.   TRUE METRIX BLOOD GLUCOSE TEST TEST STRIP    1 each by Other route 3 (three) times daily.   TRUEPLUS LANCETS 33G MISC    Apply 1 each topically 3 (three) times daily.  Modified Medications   No medications on file  Discontinued Medications   No medications on file    Allergies Allergies  Allergen Reactions   Crab [Shellfish Allergy] Shortness Of Breath and Swelling   Erythromycin Swelling and Rash   Hm Lidocaine  Patch [Lidocaine ]     Burning on skin   Erdafitinib     Other reaction(s): pt not sure   Fish Allergy     Swelling    Metoclopramide Other (See Comments)    Slurred speech and unable to move muscles,  Caused her to drag her feet   Sulfacetamide     Other reaction(s): itching, rash   Sulfacetamide Sodium-Sulfur Other (See Comments)    Other reaction(s): itching, rash   Doxycycline      GI Intolerance   Levaquin [Levofloxacin In D5w] Other (  See Comments)    Causes irregular heart beat   Morphine Nausea And Vomiting and Rash    Irregular heart beat   Sulfa  Antibiotics Rash    Past Medical History Past Medical History:  Diagnosis Date   Arthritis    Asthma    Carotid stenosis, bilateral    Carpal tunnel syndrome    Chronic low back pain    COPD (chronic obstructive pulmonary disease) (HCC)    Diabetes mellitus without complication (HCC)    type II    Diabetic neuropathy (HCC)    Diverticulitis    Dyspnea    mild    Frequent falls    GERD (gastroesophageal reflux disease)    History of bronchitis    Hypercholesterolemia    Hypertension    IBS (irritable bowel syndrome)    Osteoarthritis    knee   Palpitations    Right ventricular enlargement    per pt report   Sleep apnea    mild but no sleep apnea    TMJ (dislocation of temporomandibular joint)    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    Vitamin D deficiency     Past Surgical History Past Surgical History:  Procedure Laterality Date   ABDOMINAL HYSTERECTOMY  1993   CARPAL TUNNEL RELEASE Right    CERVICAL DISC SURGERY  04/29/2015   [place]   ESOPHAGEAL MANOMETRY N/A 03/03/2021   Procedure: ESOPHAGEAL MANOMETRY (EM);  Surgeon: Baldo Bonds, MD;  Location: [facility];  Service: Endoscopy;  Laterality: N/A;   ESOPHAGOGASTRODUODENOSCOPY ENDOSCOPY     with esophagus being stretched twice per pt,    EYE SURGERY     fatty 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      feeding tube placement and removal   2015   NASAL SEPTUM SURGERY     ROTATOR CUFF REPAIR Right    spurs removed from esophagus   2015   TONSILLECTOMY      Family History family history includes Allergic rhinitis in her father, mother, and sister; Collagen disease in her sister; Diabetes in her father; Heart disease in her brother and father; Hypertension in her brother and father.  Social History Social History   Socioeconomic History   Marital status: Widowed    Spouse name: Not on file   Number of children: 3   Years of education: Not on file   Highest education level: 11th grade   Occupational History    Comment: NA  Tobacco Use   Smoking status: Former    Current packs/day: 0.00    Average packs/day: 0.5 packs/day for 30.0 years (15.0 ttl pk-yrs)    Types: Cigarettes    Start date: 12/08/1964    Quit date: 12/08/1994    Years since quitting: 29.3   Smokeless tobacco: Never  Vaping Use   Vaping status: Never Used  Substance and Sexual Activity   Alcohol use: No   Drug use: No   Sexual activity: Not Currently  Other Topics Concern   Not on file  Social History Narrative   Lives with daughter   Caffeine- coffee 12 oz   Social Drivers of Corporate investment banker Strain: Low Risk  (02/09/2024)   Overall Financial Resource Strain (CARDIA)    Difficulty of Paying Living Expenses: Not very hard  Food Insecurity: Food Insecurity Present (02/09/2024)   Hunger Vital Sign    Worried About Running Out of Food in the Last Year: Never true  Ran Out of Food in the Last Year: Sometimes true  Transportation Needs: No Transportation Needs (02/09/2024)   PRAPARE - Administrator, Civil Service (Medical): No    Lack of Transportation (Non-Medical): No  Physical Activity: Sufficiently Active (02/09/2024)   Exercise Vital Sign    Days of Exercise per Week: 4 days    Minutes of Exercise per Session: 40 min  Stress: No Stress Concern Present (02/09/2024)   [employer] of Occupational Health - Occupational Stress Questionnaire    Feeling of Stress : Not at all  Social Connections: Moderately Integrated (02/09/2024)   Social Connection and Isolation Panel [NHANES]    Frequency of Communication with Friends and Family: Twice a week    Frequency of Social Gatherings with Friends and Family: Twice a week    Attends Religious Services: More than 4 times per year    Active Member of [employer] or Organizations: Yes    Attends Banker Meetings: More than 4 times per year    Marital Status: Widowed  Intimate Partner Violence: Not At Risk (05/25/2023)    Humiliation, Afraid, Rape, and Kick questionnaire    Fear of Current or Ex-Partner: No    Emotionally Abused: No    Physically Abused: No    Sexually Abused: No    Lab Results  Component Value Date   HGBA1C 8.7 (A) 04/16/2024   HGBA1C 9.1 (H) 01/15/2024   HGBA1C 7.2 (H) 08/21/2023   Lab Results  Component Value Date   CHOL 138 01/15/2024   Lab Results  Component Value Date   HDL 54 01/15/2024   Lab Results  Component Value Date   LDLCALC 60 01/15/2024   Lab Results  Component Value Date   TRIG 158 (H) 01/15/2024   Lab Results  Component Value Date   CHOLHDL 2.6 01/15/2024   Lab Results  Component Value Date   CREATININE 0.76 03/02/2024   Lab Results  Component Value Date   GFR 89.00 09/01/2016   Lab Results  Component Value Date   MICROALBUR 0.7 01/15/2024      Component Value Date/Time   NA 141 03/02/2024 0943   NA 146 (H) 08/21/2023 1156   K 3.9 03/02/2024 0943   CL 110 03/02/2024 0943   CO2 23 03/02/2024 0943   GLUCOSE 175 (H) 03/02/2024 0943   BUN 7 (L) 03/02/2024 0943   BUN 9 08/21/2023 1156   CREATININE 0.76 03/02/2024 0943   CREATININE 0.98 01/15/2024 1237   CALCIUM  9.4 03/02/2024 0943   PROT 6.5 03/02/2024 0943   PROT 6.6 08/21/2023 1156   ALBUMIN 3.5 03/02/2024 0943   ALBUMIN 4.2 08/21/2023 1156   AST 19 03/02/2024 0943   ALT 21 03/02/2024 0943   ALKPHOS 72 03/02/2024 0943   BILITOT 0.7 03/02/2024 0943   BILITOT 0.6 08/21/2023 1156   GFRNONAA >60 03/02/2024 0943   GFRAA >60 07/08/2020 2000      Latest Ref Rng & Units 03/02/2024    9:43 AM 01/15/2024   12:37 PM 08/21/2023   11:56 AM  BMP  Glucose 70 - 99 mg/dL 409  811  914   BUN 8 - 23 mg/dL 7  10  9    Creatinine 0.44 - 1.00 mg/dL 7.82  9.56  2.13   BUN/Creat Ratio 6 - 22 (calc)  SEE NOTE:  11   Sodium 135 - 145 mmol/L 141  139  146   Potassium 3.5 - 5.1 mmol/L 3.9  4.1  5.0  Chloride 98 - 111 mmol/L 110  103  107   CO2 22 - 32 mmol/L 23  27  25    Calcium  8.9 - 10.3 mg/dL 9.4   9.3  9.6        Component Value Date/Time   WBC 4.8 03/02/2024 0943   RBC 5.56 (H) 03/02/2024 0943   HGB 16.1 (H) 03/02/2024 0943   HGB 14.6 08/21/2023 1156   HCT 49.6 (H) 03/02/2024 0943   HCT 46.4 08/21/2023 1156   PLT 297 03/02/2024 0943   PLT 290 08/21/2023 1156   MCV 89.2 03/02/2024 0943   MCV 90 08/21/2023 1156   MCH 29.0 03/02/2024 0943   MCHC 32.5 03/02/2024 0943   RDW 13.4 03/02/2024 0943   RDW 13.2 08/21/2023 1156   LYMPHSABS 1.6 03/02/2024 0943   LYMPHSABS 1.7 08/21/2023 1156   MONOABS 0.5 03/02/2024 0943   EOSABS 0.0 03/02/2024 0943   EOSABS 0.1 08/21/2023 1156   BASOSABS 0.0 03/02/2024 0943   BASOSABS 0.0 08/21/2023 1156     Parts of this note may have been dictated using voice recognition software. There may be variances in spelling and vocabulary which are unintentional. Not all errors are proofread. Please notify the [facility] if any discrepancies are noted or if the meaning of any statement is not clear.

## 2024-04-18 ENCOUNTER — Other Ambulatory Visit: Payer: Self-pay

## 2024-04-18 ENCOUNTER — Encounter: Payer: Self-pay | Admitting: Allergy

## 2024-04-18 ENCOUNTER — Ambulatory Visit (INDEPENDENT_AMBULATORY_CARE_PROVIDER_SITE_OTHER): Payer: 59 | Admitting: Allergy

## 2024-04-18 VITALS — BP 138/76 | HR 86 | Temp 98.3°F | Resp 19 | Ht 65.0 in | Wt 201.4 lb

## 2024-04-18 DIAGNOSIS — K2100 Gastro-esophageal reflux disease with esophagitis, without bleeding: Secondary | ICD-10-CM

## 2024-04-18 DIAGNOSIS — T7800XD Anaphylactic reaction due to unspecified food, subsequent encounter: Secondary | ICD-10-CM

## 2024-04-18 DIAGNOSIS — J3089 Other allergic rhinitis: Secondary | ICD-10-CM

## 2024-04-18 DIAGNOSIS — H1013 Acute atopic conjunctivitis, bilateral: Secondary | ICD-10-CM | POA: Diagnosis not present

## 2024-04-18 DIAGNOSIS — T50905D Adverse effect of unspecified drugs, medicaments and biological substances, subsequent encounter: Secondary | ICD-10-CM

## 2024-04-18 DIAGNOSIS — J4540 Moderate persistent asthma, uncomplicated: Secondary | ICD-10-CM | POA: Diagnosis not present

## 2024-04-18 DIAGNOSIS — Z888 Allergy status to other drugs, medicaments and biological substances status: Secondary | ICD-10-CM | POA: Diagnosis not present

## 2024-04-18 MED ORDER — CARBINOXAMINE MALEATE 4 MG PO TABS: 8.0000 mg | ORAL_TABLET | Freq: Two times a day (BID) | ORAL | 5 refills | Status: DC

## 2024-04-18 MED ORDER — MONTELUKAST SODIUM 10 MG PO TABS
10.0000 mg | ORAL_TABLET | Freq: Every day | ORAL | 5 refills | Status: DC
  Filled 2024-05-28 – 2024-06-19 (×3): qty 30, 30d supply, fill #0
  Filled 2024-07-17 (×2): qty 30, 30d supply, fill #1

## 2024-04-18 MED ORDER — AZELASTINE HCL 0.05 % OP SOLN: 1.0000 [drp] | Freq: Two times a day (BID) | OPHTHALMIC | 5 refills | Status: AC

## 2024-04-18 MED ORDER — FLUTICASONE PROPIONATE 50 MCG/ACT NA SUSP
2.0000 | Freq: Every day | NASAL | 9 refills | Status: AC
  Filled 2024-06-04: qty 16, 30d supply, fill #0
  Filled 2024-06-28: qty 16, 30d supply, fill #1
  Filled 2024-07-29 (×3): qty 16, 30d supply, fill #2
  Filled 2024-08-27: qty 16, 30d supply, fill #3
  Filled 2024-09-23: qty 16, 30d supply, fill #4
  Filled 2024-10-21: qty 16, 30d supply, fill #5

## 2024-04-18 MED ORDER — BUDESONIDE-FORMOTEROL FUMARATE 160-4.5 MCG/ACT IN AERO
2.0000 | INHALATION_SPRAY | Freq: Two times a day (BID) | RESPIRATORY_TRACT | 5 refills | Status: AC
  Filled 2024-07-10: qty 10.2, 30d supply, fill #0
  Filled 2024-09-05: qty 10.2, 30d supply, fill #1
  Filled 2024-10-04: qty 10.2, 30d supply, fill #2
  Filled 2024-11-11: qty 10.2, 30d supply, fill #3
  Filled 2024-12-13: qty 10.2, 30d supply, fill #4

## 2024-04-18 NOTE — Progress Notes (Signed)
 Follow-up Note  RE: Pamela Roberson MRN: [id number] DOB: 08-16-1956 Date of Office Visit: 04/18/2024   History of present illness: Pamela Roberson is a 68 y.o. female presenting today for follow-up of asthma allergic rhinitis with conjunctivitis, food allergy GERD and medication allergies.  She was last seen in the office on 12/14/2023 by myself. Discussed the use of AI scribe software for clinical note transcription with the patient, who gave verbal consent to proceed.  She experienced an episode of food poisoning around April 27 where she was having diarrhea however this seemed to exacerbate her asthma symptoms, causing chest pain and difficulty breathing, requiring the use of a nebulizer. She was sick for about two weeks during this episode.  Since recovering from the food poisoning, she has been experiencing chills, a light cough, and congestion in her throat and nose. She has not needed to use her rescue inhaler recently despite these symptoms. She notes hoarseness and mucus in her throat, which she attributes to her allergies.  Her allergies have been particularly troublesome, with frequent epistaxis and congestion. She uses Flonase  nasal spray and carbinoxamine  tablets to manage her symptoms, but finds that carbinoxamine  primarily helps with itching. She has tried other nasal sprays like Atrovent , Astelin  and Ryaltris, but they caused burning sensations in the nose, were not effective and were not tolerated.  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 She also experiences reflux and takes pantoprazole  and Pepcid , which have been effective in managing her symptoms.  Her current medications include Symbicort  80mcg inhaler, which was recently switched from Advair  due to insurance coverage  issues. She has not had to use her EpiPen  however he does continue to avoid fish and shellfish.   She continues to take pantoprazole  and Pepcid .  She states the Pepcid  addition was likely helpful in better controlling her reflux.  She is going to see her cardiologist to sort through some of her heart issues as she states she previously was told she had a blockage in one of her coronaries however another doctor told her that her heart was fine but a different cardiologist said she did have a blockage so she is going to sting her out what exactly is going on with her heart.  She has been having these chest pain episodes and wants to do all she can to prevent symptoms and fully understand what is going on.    Review of systems: 10pt ROS negative unless noted above in HPI  Past medical/social/surgical/family history have been reviewed and are unchanged unless specifically indicated below.  No changes  Medication List: Current Outpatient Medications  Medication Sig Dispense Refill   acyclovir  (ZOVIRAX ) 400 MG tablet Take 1 tablet (400 mg total) by mouth 2 (two) times daily. 30 tablet 4   albuterol  (PROVENTIL ) (2.5 MG/3ML) 0.083% nebulizer solution Take 3 mLs (2.5 mg total) by nebulization every 4 (four) hours as needed for wheezing or shortness of breath. 75 mL 1   albuterol  (VENTOLIN  HFA) 108 (90 Base) MCG/ACT inhaler Inhale 2 puffs into the lungs every 4 (four) hours as needed for wheezing or shortness of breath. 18 g 1   amLODipine  (NORVASC ) 5 MG tablet Take 1 tablet (5 mg total) by mouth daily. 90 tablet 3   Artificial Tear Ointment (DRY EYES OP) Place 1-2 drops into both eyes daily  as needed (for dry eyes).      ascorbic acid (VITAMIN C) 1000 MG tablet Take 1 tablet by mouth daily.     azelastine  (OPTIVAR ) 0.05 % ophthalmic solution Place 1 drop into both eyes 2 (two) times daily. 6 mL 5   Blood Glucose Calibration (TRUE METRIX LEVEL 1) Low SOLN      Blood Glucose Monitoring Suppl (TRUE  METRIX METER) w/Device KIT      Blood Glucose Monitoring Suppl DEVI 1 each by Does not apply route in the morning, at noon, and at bedtime. May substitute to any manufacturer covered by patient's insurance. 1 each 0   Carbinoxamine  Maleate 4 MG TABS Take 2 tablets (8 mg total) by mouth 2 (two) times daily. 56 tablet 5   cholecalciferol (VITAMIN D3) 25 MCG (1000 UNIT) tablet Take 2,000 Units by mouth daily.     Continuous Glucose Receiver (DEXCOM G7 RECEIVER) DEVI 1 Device by Does not apply route continuous. 1 each 0   Continuous Glucose Sensor (DEXCOM G7 SENSOR) MISC 1 Device by Does not apply route continuous. 9 each 0   cycloSPORINE (RESTASIS) 0.05 % ophthalmic emulsion Place 1 drop into both eyes 2 times daily.     EPINEPHrine  0.3 mg/0.3 mL IJ SOAJ injection Inject 0.3 mg into the muscle as needed for anaphylaxis. 1 each 1   famotidine  (PEPCID ) 20 MG tablet TAKE 1 TABLET BY MOUTH 1 TO 2 TIMES DAILY 60 tablet 5   fluticasone  (FLONASE ) 50 MCG/ACT nasal spray Place 2 sprays into both nostrils daily. 1 g 9   Glucose Blood (BLOOD GLUCOSE TEST STRIPS) STRP 1 each by In Vitro route in the morning, at noon, and at bedtime. May substitute to any manufacturer covered by patient's insurance. 100 each 3   insulin  glargine (LANTUS ) 100 UNIT/ML Solostar Pen Inject 10 Units into the skin daily. 9 mL 1   Insulin  Pen Needle (PEN NEEDLES) 32G X 4 MM MISC 1 Device by Does not apply route daily. 200 each 2   lidocaine  (XYLOCAINE ) 2 % solution Use as directed 15 mLs in the mouth or throat every 4 (four) hours as needed for mouth pain. 100 mL 1   metFORMIN  (GLUCOPHAGE -XR) 500 MG 24 hr tablet Take 1 tablet (500 mg total) by mouth 2 (two) times daily with a meal. 120 tablet 2   metoprolol  succinate (TOPROL -XL) 25 MG 24 hr tablet Take 1 tablet (25 mg total) by mouth daily. 90 tablet 3   montelukast  (SINGULAIR ) 10 MG tablet Take 1 tablet (10 mg total) by mouth daily. 30 tablet 5   Multiple Vitamin (MULTIVITAMIN WITH  MINERALS) TABS tablet Take 1 tablet by mouth daily.     ondansetron  (ZOFRAN -ODT) 4 MG disintegrating tablet Take 1 tablet (4 mg total) by mouth every 8 (eight) hours as needed for nausea or vomiting. 20 tablet 0   pantoprazole  (PROTONIX ) 40 MG tablet Take 1 tablet (40 mg total) by mouth daily. 30 tablet 5   pravastatin  (PRAVACHOL ) 20 MG tablet Take 1 tablet (20 mg total) by mouth every evening. 90 tablet 3   tirzepatide (MOUNJARO) 2.5 MG/0.5ML Pen Inject 2.5 mg into the skin once a week. 2 mL 0   TRUEplus Lancets 33G MISC Apply 1 each topically 3 (three) times daily. 100 each 3   TRUE METRIX BLOOD GLUCOSE TEST test strip 1 each by Other route 3 (three) times daily. (Patient not taking: Reported on 04/18/2024) 100 each 3   No current facility-administered medications for this visit.  Known medication allergies: Allergies  Allergen Reactions   Crab [Shellfish Allergy] Shortness Of Breath and Swelling   Erythromycin Swelling and Rash   Hm Lidocaine  Patch [Lidocaine ]     Burning on skin   Erdafitinib     Other reaction(s): pt not sure   Fish Allergy     Swelling    Metoclopramide Other (See Comments)    Slurred speech and unable to move muscles,  Caused her to drag her feet   Sulfacetamide     Other reaction(s): itching, rash   Sulfacetamide Sodium-Sulfur Other (See Comments)    Other reaction(s): itching, rash   Doxycycline      GI Intolerance   Levaquin [Levofloxacin In D5w] Other (See Comments)    Causes irregular heart beat   Morphine Nausea And Vomiting and Rash    Irregular heart beat   Sulfa Antibiotics Rash     Physical examination: Blood pressure 138/76, pulse 86, temperature 98.3 F (36.8 C), temperature source Temporal, resp. rate 19, height 5\' 5"  (1.651 m), weight 201 lb 6.4 oz (91.4 kg), SpO2 100%.  General: Alert, interactive, in no acute distress. HEENT: PERRLA, TMs pearly gray, turbinates mildly edematous without discharge, post-pharynx non  erythematous. Neck: Supple without lymphadenopathy. Lungs: Clear to auscultation without wheezing, rhonchi or rales. {no increased work of breathing. CV: Normal S1, S2 without murmurs. Abdomen: Nondistended, nontender. Skin: Warm and dry, without lesions or rashes. Extremities:  No clubbing, cyanosis or edema. Neuro:   Grossly intact.  Diagnositics/Labs:  Spirometry: FEV1: 1.78L 85%, FVC: 2.53L 94%, ratio consistent with nonobstructive pattern  Assessment and plan: Allergic rhinitis with conjunctivitis                                                  - continue avoidance measures for grass pollen and tobacco leaf - use Carbinoxamine  4mg  - take 8mg  (2 tabs) twice daily - Use Flonase  1-2 sprays each nostril daily. Aim upward and outward.  Asthma, moderate persistent  - continue Singulair  10mg  daily at bedtime -continue Symbicort  160mcg 2 puffs twice a day with spacer device.  -have access to albuterol  inhaler 2 puffs or albuterol  1 vial via nebulizer every 4-6 hours as needed for cough/wheeze/shortness of breath/chest tightness.  May use 15-20 minutes prior to activity.   Monitor frequency of use.    Food allergy  -food allergy testing both skin and blood work is negative for fish and shellfish - have access to self-injectable epinephrine  Epipen  0.3mg  at all times - follow emergency action plan in case of allergic reaction  GERD  -continue lifestyle modifications for reflux control (like propping up head of bed and minimizing foods that cause heartburn) -continue pantoprazole  twice a day  -continue Pepcid  20 mg 1-2 times a day  Adverse effects of medications  -continue your current medication avoidance  Follow-up in 6 months or sooner if needed  I appreciate the opportunity to take part in Annaleigha's care. Please do not hesitate to contact me with questions.  Sincerely,   Catha Clink, MD Allergy/Immunology [facility]

## 2024-04-18 NOTE — Patient Instructions (Addendum)
-   continue avoidance measures for grass pollen and tobacco leaf - use Carbinoxamine  4mg  - take 8mg  (2 tabs) twice daily - Use Flonase  1-2 sprays each nostril daily. Aim upward and outward. - use Optivar  1 drop each eye up to 2 times daily as needed for itchy/watery eyes  - continue Singulair  10mg  daily at bedtime -continue Symbicort  160mcg 2 puffs twice a day with spacer device.  -have access to albuterol  inhaler 2 puffs or albuterol  1 vial via nebulizer every 4-6 hours as needed for cough/wheeze/shortness of breath/chest tightness.  May use 15-20 minutes prior to activity.   Monitor frequency of use.    -food allergy testing both skin and blood work is negative for fish and shellfish - have access to self-injectable epinephrine  Epipen  0.3mg  at all times - follow emergency action plan in case of allergic reaction  -continue lifestyle modifications for reflux control (like propping up head of bed and minimizing foods that cause heartburn) -continue pantoprazole  twice a day  -continue Pepcid  20 mg 1-2 times a day  -continue your current medication avoidance  Follow-up in 6 months or sooner if needed

## 2024-04-22 DIAGNOSIS — M5416 Radiculopathy, lumbar region: Secondary | ICD-10-CM | POA: Diagnosis not present

## 2024-04-23 ENCOUNTER — Other Ambulatory Visit: Payer: Self-pay | Admitting: Nurse Practitioner

## 2024-04-23 DIAGNOSIS — Z1231 Encounter for screening mammogram for malignant neoplasm of breast: Secondary | ICD-10-CM

## 2024-04-30 ENCOUNTER — Ambulatory Visit: Admitting: Dietician

## 2024-04-30 ENCOUNTER — Encounter (HOSPITAL_COMMUNITY): Payer: Self-pay

## 2024-04-30 ENCOUNTER — Ambulatory Visit (HOSPITAL_COMMUNITY)
Admission: EM | Admit: 2024-04-30 | Discharge: 2024-04-30 | Disposition: A | Discharge status: Home / Self Care | Attending: Emergency Medicine | Admitting: Emergency Medicine

## 2024-04-30 DIAGNOSIS — M5412 Radiculopathy, cervical region: Secondary | ICD-10-CM

## 2024-04-30 MED ORDER — METHYLPREDNISOLONE SODIUM SUCC 125 MG IJ SOLR
INTRAMUSCULAR | Status: AC
  Filled 2024-04-30: qty 2

## 2024-04-30 MED ORDER — METHYLPREDNISOLONE SODIUM SUCC 125 MG IJ SOLR
60.0000 mg | Freq: Once | INTRAMUSCULAR | Status: AC
  Administered 2024-04-30: 60 mg via INTRAMUSCULAR

## 2024-04-30 MED ORDER — BACLOFEN 5 MG PO TABS
5.0000 mg | ORAL_TABLET | Freq: Two times a day (BID) | ORAL | 0 refills | Status: DC | PRN
Start: 2024-04-30 — End: 2024-08-22

## 2024-04-30 NOTE — ED Provider Notes (Signed)
 [facility]    CSN: [id number] Arrival date & time: 04/30/24  0805      History   Chief Complaint Chief Complaint  Patient presents with   Neck Pain    HPI Pamela Roberson is a 68 y.o. female.  3 weeks of left shoulder pain that radiates down into the left arm. Also has intermittent tingling in the left fingers. Denies injury or trauma.  She has history of these symptoms. Also had them on the right side in the past and needed surgery. Reports "a nerve was cut". She does have an orthopedic specialist but did not want to see them because she was afraid of surgery again No interventions have been attempted yet  Diabetes history Glucose currently 266 per dexcom, about 45 minutes after eating cookies and ginger ale in the waiting room  Past Medical History:  Diagnosis Date   Arthritis    Asthma    Carotid stenosis, bilateral    Carpal tunnel syndrome    Chronic low back pain    COPD (chronic obstructive pulmonary disease) (HCC)    Diabetes mellitus without complication (HCC)    type II    Diabetic neuropathy (HCC)    Diverticulitis    Dyspnea    mild    Frequent falls    GERD (gastroesophageal reflux disease)    History of bronchitis    Hypercholesterolemia    Hypertension    IBS (irritable bowel syndrome)    Osteoarthritis    knee   Palpitations    Right ventricular enlargement    per pt report   Sleep apnea    mild but no sleep apnea    TMJ (dislocation of temporomandibular joint)    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    Vitamin D deficiency     Patient Active Problem List   Diagnosis Date Noted   Primary hypertension 02/13/2024   Diabetes mellitus with coincident hypertension (HCC) 02/13/2024   Class 1 obesity due to excess calories with body mass index (BMI) of 33.0 to 33.9 in adult 02/13/2024   Chronic maxillary sinusitis 01/30/2024   Epistaxis 12/25/2023   Hypertrophy of nasal turbinates 12/25/2023   COVID-19 vaccination declined  10/24/2023   Need for influenza vaccination 10/24/2023   Class 1 obesity without serious comorbidity with body mass index (BMI) of 30.0 to 30.9 in adult 10/24/2023   Encounter for annual health examination 08/31/2023   Type 2 diabetes mellitus with diabetic neuropathy, without long-term current use of insulin  (HCC) 08/31/2023   Estrogen deficiency 08/31/2023   Herpes zoster vaccination declined 08/31/2023   Influenza vaccination declined 08/31/2023   Post-nasal drip 08/31/2023   Muscle weakness (generalized) 08/31/2023   Degeneration of lumbar intervertebral disc 07/26/2023   Diabetic peripheral neuropathy (HCC) 07/26/2023   Left leg weakness 07/26/2023   Lumbar radiculopathy 07/26/2023   Pain in left knee 02/15/2022   Sciatica, left side 02/15/2022   Pain in left shoulder 11/16/2021   Internal hemorrhoids 11/03/2021   Leg cramps 11/03/2021   Myalgia 11/03/2021   Adenomatous polyp of colon 12/04/2020   Anal fissure 12/04/2020   Chronic idiopathic constipation 12/04/2020   Constipation 12/04/2020   Dysphagia 12/04/2020   Gastroesophageal reflux disease 12/04/2020   Rectal pain 12/04/2020   Right upper quadrant pain 12/04/2020   Dizzy 12/11/2017   Pharyngeal dysphagia 12/11/2017   Referred ear pain, bilateral 12/11/2017   Diabetes mellitus without complication (HCC) 06/29/2017   Family history of glaucoma 06/29/2017  Hyperopia of both eyes with astigmatism and presbyopia 06/29/2017   Keratoconjunctivitis sicca of both eyes not specified as Sjogren's 06/29/2017   Nuclear sclerotic cataract of both eyes 06/29/2017   Vitreous floater, bilateral 06/29/2017   Biceps tendinitis of right shoulder 04/05/2017   Impingement syndrome of right shoulder 04/05/2017   Arthritis of right acromioclavicular joint 04/05/2017   Partial nontraumatic tear of rotator cuff, right 04/05/2017    Past Surgical History:  Procedure Laterality Date   ABDOMINAL HYSTERECTOMY  1993   CARPAL TUNNEL  RELEASE Right    CERVICAL DISC SURGERY  04/29/2015   Dr Evalyn [place]   ESOPHAGEAL MANOMETRY N/A 03/03/2021   Procedure: ESOPHAGEAL MANOMETRY (EM);  Surgeon: Baldo Bonds, MD;  Location: [facility];  Service: Endoscopy;  Laterality: N/A;   ESOPHAGOGASTRODUODENOSCOPY ENDOSCOPY     with esophagus being stretched twice per pt,    EYE SURGERY     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      Home Medications    Prior to Admission medications   Medication Sig Start Date End Date Taking? Authorizing Provider  Baclofen 5 MG TABS Take 1 tablet (5 mg total) by mouth 2 (two) times daily as needed. 04/30/24  Yes Taylon Louison, Ivette Marks, PA-C  acyclovir  (ZOVIRAX ) 400 MG tablet Take 1 tablet (400 mg total) by mouth 2 (two) times daily. 02/13/24   Susanna Epley, FNP  albuterol  (PROVENTIL ) (2.5 MG/3ML) 0.083% nebulizer solution Take 3 mLs (2.5 mg total) by nebulization every 4 (four) hours as needed for wheezing or shortness of breath. 12/14/23   Brian Campanile, MD  albuterol  (VENTOLIN  HFA) 108 (90 Base) MCG/ACT inhaler Inhale 2 puffs into the lungs every 4 (four) hours as needed for wheezing or shortness of breath. 12/14/23   Brian Campanile, MD  amLODipine  (NORVASC ) 5 MG tablet Take 1 tablet (5 mg total) by mouth daily. 10/10/23   Tobb, Kardie, DO  Artificial Tear Ointment (DRY EYES OP) Place 1-2 drops into both eyes daily as needed (for dry eyes).     [provider]  ascorbic acid (VITAMIN C) 1000 MG tablet Take 1 tablet by mouth daily.    [provider]  azelastine  (OPTIVAR ) 0.05 % ophthalmic solution Place 1 drop into both eyes 2 (two) times daily. 04/18/24   Brian Campanile, MD  Blood Glucose Calibration (TRUE METRIX LEVEL 1) Low SOLN  09/20/21    [provider]  Blood Glucose Monitoring Suppl (TRUE METRIX METER) w/Device KIT  09/20/21   [provider]  Blood Glucose Monitoring Suppl DEVI 1 each by Does not apply route in the morning, at noon, and at bedtime. May substitute to any manufacturer covered by patient's insurance. 03/14/24   Motwani, Komal, MD  budesonide -formoterol  (SYMBICORT ) 160-4.5 MCG/ACT inhaler 2 puff twice a day with spacer 04/18/24   Brian Campanile, MD  Carbinoxamine  Maleate 4 MG TABS Take 2 tablets (8 mg total) by mouth 2 (two) times daily. 04/18/24   Brian Campanile, MD  cholecalciferol (VITAMIN D3) 25 MCG (1000 UNIT) tablet Take 2,000 Units by mouth daily.    [provider]  Continuous Glucose Receiver (DEXCOM G7 RECEIVER) DEVI 1 Device by Does  not apply route continuous. 03/14/24   Motwani, Komal, MD  Continuous Glucose Sensor (DEXCOM G7 SENSOR) MISC 1 Device by Does not apply route continuous. 03/14/24   Motwani, Komal, MD  cycloSPORINE (RESTASIS) 0.05 % ophthalmic emulsion Place 1 drop into both eyes 2 times daily. 03/08/22   [provider]  EPINEPHrine  0.3 mg/0.3 mL IJ SOAJ injection Inject 0.3 mg into the muscle as needed for anaphylaxis. 12/14/23   Brian Campanile, MD  famotidine  (PEPCID ) 20 MG tablet TAKE 1 TABLET BY MOUTH 1 TO 2 TIMES DAILY 04/09/24   Brian Campanile, MD  fluticasone  (FLONASE ) 50 MCG/ACT nasal spray Place 2 sprays into both nostrils daily. 04/18/24   Brian Campanile, MD  Glucose Blood (BLOOD GLUCOSE TEST STRIPS) STRP 1 each by In Vitro route in the morning, at noon, and at bedtime. May substitute to any manufacturer covered by patient's insurance. 03/14/24 07/25/24  Motwani, Komal, MD  insulin  glargine (LANTUS ) 100 UNIT/ML Solostar Pen Inject 10 Units into the skin daily. 03/14/24 06/12/24  Motwani, Komal, MD  Insulin  Pen Needle (PEN NEEDLES) 32G X 4 MM MISC 1 Device by Does not apply route daily. 03/15/24   Motwani, Komal, MD   metFORMIN  (GLUCOPHAGE -XR) 500 MG 24 hr tablet Take 1 tablet (500 mg total) by mouth 2 (two) times daily with a meal. 03/14/24   Motwani, Joanna Muck, MD  metoprolol  succinate (TOPROL -XL) 25 MG 24 hr tablet Take 1 tablet (25 mg total) by mouth daily. 10/10/23   Tobb, Kardie, DO  montelukast  (SINGULAIR ) 10 MG tablet Take 1 tablet (10 mg total) by mouth daily. 04/18/24   Brian Campanile, MD  Multiple Vitamin (MULTIVITAMIN WITH MINERALS) TABS tablet Take 1 tablet by mouth daily.    [provider]  ondansetron  (ZOFRAN -ODT) 4 MG disintegrating tablet Take 1 tablet (4 mg total) by mouth every 8 (eight) hours as needed for nausea or vomiting. 11/26/23   Lorenzo Romberg A, PA-C  pantoprazole  (PROTONIX ) 40 MG tablet Take 1 tablet (40 mg total) by mouth daily. 12/14/23   Brian Campanile, MD  pravastatin  (PRAVACHOL ) 20 MG tablet Take 1 tablet (20 mg total) by mouth every evening. 10/10/23 10/09/24  Tobb, Kardie, DO  TRUE METRIX BLOOD GLUCOSE TEST test strip 1 each by Other route 3 (three) times daily. Patient not taking: Reported on 04/18/2024 02/13/24   Susanna Epley, FNP  TRUEplus Lancets 33G MISC Apply 1 each topically 3 (three) times daily. 02/13/24   Susanna Epley, FNP  esomeprazole  (NEXIUM ) 40 MG capsule Take 1 capsule (40 mg total) by mouth daily. Patient not taking: Reported on 07/08/2020 04/24/19 08/06/20  Wynetta Heckle, MD    Family History Family History  Problem Relation Age of Onset   Allergic rhinitis Mother    Diabetes Father        type 2   Heart disease Father    Hypertension Father    Allergic rhinitis Father    Allergic rhinitis Sister    Collagen disease Sister    Heart disease Brother    Hypertension Brother    Angioedema Neg Hx    Asthma Neg Hx    Atopy Neg Hx    Eczema Neg Hx    Immunodeficiency Neg Hx    Urticaria Neg Hx     Social History Social History   Tobacco Use   Smoking status: Former    Current packs/day: 0.00    Average packs/day: 0.5  packs/day for 30.0 years (15.0 ttl pk-yrs)  Types: Cigarettes    Start date: 12/08/1964    Quit date: 12/08/1994    Years since quitting: 29.4   Smokeless tobacco: Never  Vaping Use   Vaping status: Never Used  Substance Use Topics   Alcohol use: No   Drug use: No     Allergies   Crab [shellfish allergy], Erythromycin, Hm lidocaine  patch [lidocaine ], Fish allergy, Metoclopramide, Sulfacetamide, Sulfacetamide sodium-sulfur, Doxycycline , Levaquin [levofloxacin in d5w], Morphine, and Sulfa antibiotics   Review of Systems Review of Systems Per HPI  Physical Exam Triage Vital Signs ED Triage Vitals  Encounter Vitals Group     BP 04/30/24 0843 134/79     Systolic BP Percentile --      Diastolic BP Percentile --      Pulse Rate 04/30/24 0843 84     Resp 04/30/24 0843 16     Temp 04/30/24 0843 98.3 F (36.8 C)     Temp Source 04/30/24 0843 Oral     SpO2 04/30/24 0843 96 %     Weight --      Height --      Head Circumference --      Peak Flow --      Pain Score 04/30/24 0838 10     Pain Loc --      Pain Education --      Exclude from Growth Chart --    No data found.  Updated Vital Signs BP 134/79 (BP Location: Right Arm)   Pulse 84   Temp 98.3 F (36.8 C) (Oral)   Resp 16   SpO2 96%   Physical Exam Vitals and nursing note reviewed.  Constitutional:      General: She is not in acute distress. HENT:     Mouth/Throat:     Pharynx: Oropharynx is clear.  Neck:     Comments: No bony tenderness of C spine Cardiovascular:     Rate and Rhythm: Normal rate and regular rhythm.     Pulses: Normal pulses.  Pulmonary:     Effort: Pulmonary effort is normal.  Musculoskeletal:     Left shoulder: Tenderness present. No swelling or deformity. Decreased range of motion. Normal strength. Normal pulse.     Cervical back: Normal range of motion.     Comments: Tender left shoulder generalized, down into left arm. No bony tenderness of neck, clavicle, scapula, AC joint. ROM  limited due to pain. Grip strength intact. Cap refill fingers < 2 seconds, sensation normal throughout. Radial pulse 2+   Skin:    Capillary Refill: Capillary refill takes less than 2 seconds.  Neurological:     Mental Status: She is alert and oriented to person, place, and time.     UC Treatments / Results  Labs (all labs ordered are listed, but only abnormal results are displayed) Labs Reviewed - No data to display  EKG   Radiology No results found.  Procedures Procedures (including critical care time)  Medications Ordered in UC Medications  methylPREDNISolone  sodium succinate (SOLU-MEDROL ) 125 mg/2 mL injection 60 mg (60 mg Intramuscular Given 04/30/24 0922)    Initial Impression / Assessment and Plan / UC Course  I have reviewed the triage vital signs and the nursing notes.  Pertinent labs & imaging results that were available during my care of the patient were reviewed by me and considered in my medical decision making (see chart for details).  Cervical radiculopathy. Symptoms present for 3 weeks, no interventions attempted yet.  Patient with history of  this.  She has an orthopedic specialist, did not want to see them due to fear of surgery. Blood glucose 266 per her Dexcom.  She did just drink sugary ginger ale and had cookies.  Will go ahead and give Solu-Medrol  IM for radiculopathy symptoms.  Advise monitoring glucose over the next 48 hours as it will likely increase.  Try baclofen  at home, twice daily with drowsy precautions.  I have highly recommended she contact her orthopedic specialist today to discuss follow-up appointment and further treatment options. ED precautions for any severe worsening pain Patient agrees to plan, all questions answered  Final Clinical Impressions(s) / UC Diagnoses   Final diagnoses:  Cervical radiculopathy     Discharge Instructions      The steroid injection should take about 30 minutes to start working for pain.  The muscle  relaxer baclofen  can be used 2 times daily.  If this medicine makes you sleepy, be cautious and please only take before bedtime.  Call your orthopedic specialist TODAY to make a follow up appointment    ED Prescriptions     Medication Sig Dispense Auth. Provider   Baclofen  5 MG TABS Take 1 tablet (5 mg total) by mouth 2 (two) times daily as needed. 20 tablet Melvine Julin, Ivette Marks, PA-C      PDMP not reviewed this encounter.   Creighton Doffing, [place] 04/30/24 [phone]

## 2024-04-30 NOTE — ED Triage Notes (Signed)
 Patient here today with c/o neck pain X 3 week that radiates down her entire arm into the hand and fingers. Patient states that she also has a sharp pain on the back of her shoulder.

## 2024-04-30 NOTE — Discharge Instructions (Addendum)
 The steroid injection should take about 30 minutes to start working for pain.  The muscle relaxer baclofen can be used 2 times daily.  If this medicine makes you sleepy, be cautious and please only take before bedtime.  Call your orthopedic specialist TODAY to make a follow up appointment

## 2024-05-14 ENCOUNTER — Ambulatory Visit
Admission: RE | Admit: 2024-05-14 | Discharge: 2024-05-14 | Disposition: A | Discharge status: Home / Self Care | Source: Ambulatory Visit | Attending: Nurse Practitioner | Admitting: Nurse Practitioner

## 2024-05-14 DIAGNOSIS — Z1231 Encounter for screening mammogram for malignant neoplasm of breast: Secondary | ICD-10-CM

## 2024-05-15 DIAGNOSIS — E119 Type 2 diabetes mellitus without complications: Secondary | ICD-10-CM | POA: Diagnosis not present

## 2024-05-15 LAB — HM DIABETES EYE EXAM

## 2024-05-22 DIAGNOSIS — M542 Cervicalgia: Secondary | ICD-10-CM | POA: Diagnosis not present

## 2024-05-22 DIAGNOSIS — M25512 Pain in left shoulder: Secondary | ICD-10-CM | POA: Diagnosis not present

## 2024-05-28 ENCOUNTER — Other Ambulatory Visit: Payer: Self-pay

## 2024-05-28 ENCOUNTER — Other Ambulatory Visit (HOSPITAL_COMMUNITY): Payer: Self-pay

## 2024-05-28 DIAGNOSIS — E1165 Type 2 diabetes mellitus with hyperglycemia: Secondary | ICD-10-CM

## 2024-05-28 MED ORDER — INSULIN GLARGINE 100 UNIT/ML SOLOSTAR PEN
10.0000 [IU] | PEN_INJECTOR | Freq: Every day | SUBCUTANEOUS | 1 refills | Status: DC
  Filled 2024-05-28: qty 9, 90d supply, fill #0
  Filled 2024-08-21: qty 9, 90d supply, fill #1

## 2024-05-30 ENCOUNTER — Other Ambulatory Visit (HOSPITAL_COMMUNITY): Payer: Self-pay

## 2024-05-30 MED ORDER — INSULIN PEN NEEDLE 32G X 4 MM MISC
1.0000 | Freq: Every day | 2 refills | Status: DC
Start: 2024-03-15 — End: 2024-07-25
  Filled 2024-05-30: qty 100, 100d supply, fill #0

## 2024-06-03 ENCOUNTER — Other Ambulatory Visit (HOSPITAL_COMMUNITY): Payer: Self-pay

## 2024-06-04 ENCOUNTER — Other Ambulatory Visit: Payer: Self-pay

## 2024-06-04 ENCOUNTER — Encounter: Payer: Self-pay | Admitting: Pharmacist

## 2024-06-04 ENCOUNTER — Other Ambulatory Visit (HOSPITAL_COMMUNITY): Payer: Self-pay

## 2024-06-04 ENCOUNTER — Other Ambulatory Visit: Payer: Self-pay | Admitting: Allergy

## 2024-06-04 MED ORDER — CARBINOXAMINE MALEATE 4 MG PO TABS
8.0000 mg | ORAL_TABLET | Freq: Two times a day (BID) | ORAL | 5 refills | Status: DC
  Filled 2024-06-04: qty 120, 30d supply, fill #0
  Filled 2024-06-28: qty 16, 4d supply, fill #1
  Filled 2024-06-28: qty 104, 26d supply, fill #1
  Filled 2024-08-27: qty 100, 25d supply, fill #2
  Filled 2024-08-27: qty 20, 5d supply, fill #2
  Filled 2024-09-23: qty 120, 30d supply, fill #3
  Filled 2024-10-21: qty 120, 30d supply, fill #4
  Filled 2024-11-15: qty 120, 30d supply, fill #5

## 2024-06-05 ENCOUNTER — Other Ambulatory Visit (HOSPITAL_COMMUNITY): Payer: Self-pay

## 2024-06-13 ENCOUNTER — Other Ambulatory Visit (HOSPITAL_COMMUNITY): Payer: Self-pay

## 2024-06-14 ENCOUNTER — Ambulatory Visit (INDEPENDENT_AMBULATORY_CARE_PROVIDER_SITE_OTHER): Admitting: Otolaryngology

## 2024-06-16 ENCOUNTER — Ambulatory Visit
Admission: EM | Admit: 2024-06-16 | Discharge: 2024-06-16 | Disposition: A | Discharge status: Home / Self Care | Attending: Emergency Medicine | Admitting: Emergency Medicine

## 2024-06-16 ENCOUNTER — Ambulatory Visit (INDEPENDENT_AMBULATORY_CARE_PROVIDER_SITE_OTHER)

## 2024-06-16 ENCOUNTER — Other Ambulatory Visit (HOSPITAL_COMMUNITY): Payer: Self-pay

## 2024-06-16 ENCOUNTER — Ambulatory Visit

## 2024-06-16 DIAGNOSIS — M79632 Pain in left forearm: Secondary | ICD-10-CM | POA: Diagnosis not present

## 2024-06-16 DIAGNOSIS — M79642 Pain in left hand: Secondary | ICD-10-CM

## 2024-06-16 DIAGNOSIS — W19XXXA Unspecified fall, initial encounter: Secondary | ICD-10-CM

## 2024-06-16 DIAGNOSIS — M5431 Sciatica, right side: Secondary | ICD-10-CM | POA: Diagnosis not present

## 2024-06-16 DIAGNOSIS — M79602 Pain in left arm: Secondary | ICD-10-CM

## 2024-06-16 DIAGNOSIS — M79645 Pain in left finger(s): Secondary | ICD-10-CM | POA: Diagnosis not present

## 2024-06-16 DIAGNOSIS — L304 Erythema intertrigo: Secondary | ICD-10-CM | POA: Diagnosis not present

## 2024-06-16 MED ORDER — CLOTRIMAZOLE 1 % EX LOTN
1.0000 | TOPICAL_LOTION | Freq: Two times a day (BID) | CUTANEOUS | 0 refills | Status: DC
  Filled 2024-06-16: qty 30, fill #0

## 2024-06-16 MED ORDER — KETOROLAC TROMETHAMINE 30 MG/ML IJ SOLN
30.0000 mg | Freq: Once | INTRAMUSCULAR | Status: AC
  Administered 2024-06-16: 30 mg via INTRAMUSCULAR

## 2024-06-16 MED ORDER — CLOTRIMAZOLE 1 % EX LOTN: TOPICAL_LOTION | CUTANEOUS | 0 refills | Status: DC

## 2024-06-16 NOTE — ED Provider Notes (Signed)
 [facility]    CSN: [id number] Arrival date & time: 06/16/24  9060      History   Chief Complaint Chief Complaint  Patient presents with   Sciatica   Arm Injury   Fall    HPI Pamela Roberson is a 68 y.o. female.  Here with several complaints Reports she fell last night and landed on her left arm. Having pain in the elbow, forearm and hand. Currently rating 7/10. No paresthesias reported.  When she fell she did not hit her head.  History of left shoulder and arm troubles followed by orthopedics.   She also reports sciatica symptoms in the right glute to posterior leg. Pain rated 6/10 Has history of sciatica, and lumbar disc degeneration.  Diabetes history, uncontrolled. Last A1c was 8.7 (2 months ago) Reports her dexcom fell off last night. I have advised her to pick up a new one at the pharmacy.    Additionally mentions a rash under her breasts that's itchy   Past Medical History:  Diagnosis Date   Arthritis    Asthma    Carotid stenosis, bilateral    Carpal tunnel syndrome    Chronic low back pain    COPD (chronic obstructive pulmonary disease) (HCC)    Diabetes mellitus without complication (HCC)    type II    Diabetic neuropathy (HCC)    Diverticulitis    Dyspnea    mild    Frequent falls    GERD (gastroesophageal reflux disease)    History of bronchitis    Hypercholesterolemia    Hypertension    IBS (irritable bowel syndrome)    Osteoarthritis    knee   Palpitations    Right ventricular enlargement    per pt report   Sleep apnea    mild but no sleep apnea    TMJ (dislocation of temporomandibular joint)    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    Vitamin D deficiency     Patient Active Problem List   Diagnosis Date Noted   Primary hypertension 02/13/2024   Diabetes mellitus with coincident hypertension (HCC) 02/13/2024   Class 1 obesity due to excess calories with body mass index (BMI) of 33.0 to 33.9 in adult 02/13/2024    Chronic maxillary sinusitis 01/30/2024   Epistaxis 12/25/2023   Hypertrophy of nasal turbinates 12/25/2023   COVID-19 vaccination declined 10/24/2023   Need for influenza vaccination 10/24/2023   Class 1 obesity without serious comorbidity with body mass index (BMI) of 30.0 to 30.9 in adult 10/24/2023   Encounter for annual health examination 08/31/2023   Type 2 diabetes mellitus with diabetic neuropathy, without long-term current use of insulin  (HCC) 08/31/2023   Estrogen deficiency 08/31/2023   Herpes zoster vaccination declined 08/31/2023   Influenza vaccination declined 08/31/2023   Post-nasal drip 08/31/2023   Muscle weakness (generalized) 08/31/2023   Degeneration of lumbar intervertebral disc 07/26/2023   Diabetic peripheral neuropathy (HCC) 07/26/2023   Left leg weakness 07/26/2023   Lumbar radiculopathy 07/26/2023   Pain in left knee 02/15/2022   Sciatica, left side 02/15/2022   Pain in left shoulder 11/16/2021   Internal hemorrhoids 11/03/2021   Leg cramps 11/03/2021   Myalgia 11/03/2021   Adenomatous polyp of colon 12/04/2020   Anal fissure 12/04/2020   Chronic idiopathic constipation 12/04/2020   Constipation 12/04/2020   Dysphagia 12/04/2020   Gastroesophageal reflux disease 12/04/2020   Rectal pain 12/04/2020   Right upper quadrant pain 12/04/2020   Dizzy  12/11/2017   Pharyngeal dysphagia 12/11/2017   Referred ear pain, bilateral 12/11/2017   Diabetes mellitus without complication (HCC) 06/29/2017   Family history of glaucoma 06/29/2017   Hyperopia of both eyes with astigmatism and presbyopia 06/29/2017   Keratoconjunctivitis sicca of both eyes not specified as Sjogren's 06/29/2017   Nuclear sclerotic cataract of both eyes 06/29/2017   Vitreous floater, bilateral 06/29/2017   Biceps tendinitis of right shoulder 04/05/2017   Impingement syndrome of right shoulder 04/05/2017   Arthritis of right acromioclavicular joint 04/05/2017   Partial nontraumatic tear of  rotator cuff, right 04/05/2017    Past Surgical History:  Procedure Laterality Date   ABDOMINAL HYSTERECTOMY  1993   CARPAL TUNNEL RELEASE Right    CERVICAL DISC SURGERY  04/29/2015   Dr Erle [place]   ESOPHAGEAL MANOMETRY N/A 03/03/2021   Procedure: ESOPHAGEAL MANOMETRY (EM);  Surgeon: Dianna Specking, MD;  Location: [facility];  Service: Endoscopy;  Laterality: N/A;   ESOPHAGOGASTRODUODENOSCOPY ENDOSCOPY     with esophagus being stretched twice per pt,    EYE SURGERY     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      Home Medications    Prior to Admission medications   Medication Sig Start Date End Date Taking? Authorizing Provider  Clotrimazole  1 % LOTN Apply to rash twice daily for 1-2 weeks 06/16/24  Yes Elma Limas, [place]berry, PA-C  acyclovir  (ZOVIRAX ) 400 MG tablet Take 1 tablet (400 mg total) by mouth 2 (two) times daily. 02/13/24   Georgina Speaks, FNP  albuterol  (PROVENTIL ) (2.5 MG/3ML) 0.083% nebulizer solution Take 3 mLs (2.5 mg total) by nebulization every 4 (four) hours as needed for wheezing or shortness of breath. 12/14/23   Jeneal Danita Macintosh, MD  albuterol  (VENTOLIN  HFA) [phone] Base) MCG/ACT inhaler Inhale 2 puffs into the lungs every 4 (four) hours as needed for wheezing or shortness of breath. 12/14/23   Jeneal Danita Macintosh, MD  amLODipine  (NORVASC ) 5 MG tablet Take 1 tablet (5 mg total) by mouth daily. 10/10/23   Tobb, Kardie, DO  Artificial Tear Ointment (DRY EYES OP) Place 1-2 drops into both eyes daily as needed (for dry eyes).     [provider]  ascorbic acid (VITAMIN C) 1000 MG tablet Take 1 tablet by mouth daily.    [provider]  azelastine  (OPTIVAR ) 0.05 % ophthalmic solution Place 1 drop into both eyes 2 (two) times daily.  04/18/24   Jeneal Danita Macintosh, MD  Baclofen  5 MG TABS Take 1 tablet (5 mg total) by mouth 2 (two) times daily as needed. 04/30/24   Tera Pellicane, [place]berry, PA-C  Blood Glucose Calibration (TRUE METRIX LEVEL 1) Low SOLN  09/20/21   [provider]  Blood Glucose Monitoring Suppl (TRUE METRIX METER) w/Device KIT  09/20/21   [provider]  Blood Glucose Monitoring Suppl DEVI 1 each by Does not apply route in the morning, at noon, and at bedtime. May substitute to any manufacturer covered by patient's insurance. 03/14/24   Motwani, Komal, MD  budesonide -formoterol  (SYMBICORT ) 160-4.5 MCG/ACT inhaler 2 puff twice a day with spacer 04/18/24   Jeneal Danita Macintosh, MD  Carbinoxamine  Maleate 4 MG TABS Take 2 tablets (  8 mg total) by mouth 2 (two) times daily. 06/04/24   Jeneal Danita Macintosh, MD  cholecalciferol (VITAMIN D3) 25 MCG (1000 UNIT) tablet Take 2,000 Units by mouth daily.    [provider]  Continuous Glucose Receiver (DEXCOM G7 RECEIVER) DEVI 1 Device by Does not apply route continuous. 03/14/24   Motwani, Komal, MD  Continuous Glucose Sensor (DEXCOM G7 SENSOR) MISC 1 Device by Does not apply route continuous. 03/14/24   Motwani, Komal, MD  cycloSPORINE (RESTASIS) 0.05 % ophthalmic emulsion Place 1 drop into both eyes 2 times daily. 03/08/22   [provider]  EPINEPHrine  0.3 mg/0.3 mL IJ SOAJ injection Inject 0.3 mg into the muscle as needed for anaphylaxis. 12/14/23   Jeneal Danita Macintosh, MD  famotidine  (PEPCID ) 20 MG tablet TAKE 1 TABLET BY MOUTH 1 TO 2 TIMES DAILY 04/09/24   Jeneal Danita Macintosh, MD  fluticasone  (FLONASE ) 50 MCG/ACT nasal spray Place 2 sprays into both nostrils daily. 12/25/23     Glucose Blood (BLOOD GLUCOSE TEST STRIPS) STRP 1 each by In Vitro route in the morning, at noon, and at bedtime. May substitute to any manufacturer covered by patient's insurance. 03/14/24 07/25/24  Motwani, Komal, MD  insulin  glargine (LANTUS ) 100 UNIT/ML  Solostar Pen Inject 10 Units into the skin daily. 05/28/24   Motwani, Komal, MD  Insulin  Pen Needle (PEN NEEDLES) 32G X 4 MM MISC 1 Device by Does not apply route daily. 03/15/24   Motwani, Komal, MD  Insulin  Pen Needle 32G X 4 MM MISC Use 1 pen needle daily. 03/15/24     metFORMIN  (GLUCOPHAGE -XR) 500 MG 24 hr tablet Take 1 tablet (500 mg total) by mouth 2 (two) times daily with a meal. 03/14/24   Motwani, Obadiah, MD  metoprolol  succinate (TOPROL -XL) 25 MG 24 hr tablet Take 1 tablet (25 mg total) by mouth daily. 10/10/23   Tobb, Kardie, DO  montelukast  (SINGULAIR ) 10 MG tablet Take 1 tablet (10 mg total) by mouth daily. 12/14/23   Jeneal Danita Macintosh, MD  Multiple Vitamin (MULTIVITAMIN WITH MINERALS) TABS tablet Take 1 tablet by mouth daily.    [provider]  ondansetron  (ZOFRAN -ODT) 4 MG disintegrating tablet Take 1 tablet (4 mg total) by mouth every 8 (eight) hours as needed for nausea or vomiting. 11/26/23   Teresa Almarie LABOR, PA-C  pantoprazole  (PROTONIX ) 40 MG tablet Take 1 tablet (40 mg total) by mouth daily. 12/14/23   Jeneal Danita Macintosh, MD  pravastatin  (PRAVACHOL ) 20 MG tablet Take 1 tablet (20 mg total) by mouth every evening. 10/10/23 10/09/24  Tobb, Kardie, DO  TRUE METRIX BLOOD GLUCOSE TEST test strip 1 each by Other route 3 (three) times daily. Patient not taking: Reported on 04/18/2024 02/13/24   Georgina Speaks, FNP  TRUEplus Lancets 33G MISC Apply 1 each topically 3 (three) times daily. 02/13/24   Georgina Speaks, FNP  esomeprazole  (NEXIUM ) 40 MG capsule Take 1 capsule (40 mg total) by mouth daily. Patient not taking: Reported on 07/08/2020 04/24/19 08/06/20  Armenta Canning, MD    Family History Family History  Problem Relation Age of Onset   Allergic rhinitis Mother    Diabetes Father        type 2   Heart disease Father    Hypertension Father    Allergic rhinitis Father    Allergic rhinitis Sister    Collagen disease Sister    Heart disease Brother    Hypertension  Brother    Angioedema Neg Hx    Asthma Neg  Hx    Atopy Neg Hx    Eczema Neg Hx    Immunodeficiency Neg Hx    Urticaria Neg Hx     Social History Social History   Tobacco Use   Smoking status: Former    Current packs/day: 0.00    Average packs/day: 0.5 packs/day for 30.0 years (15.0 ttl pk-yrs)    Types: Cigarettes    Start date: 12/08/1964    Quit date: 12/08/1994    Years since quitting: 29.5   Smokeless tobacco: Never  Vaping Use   Vaping status: Never Used  Substance Use Topics   Alcohol use: No   Drug use: No     Allergies   Crab [shellfish allergy], Erythromycin, Hm lidocaine  patch [lidocaine ], Fish allergy, Metoclopramide, Sulfacetamide, Sulfacetamide sodium-sulfur, Doxycycline , Levaquin [levofloxacin in d5w], Morphine, and Sulfa antibiotics   Review of Systems Review of Systems As per HPI  Physical Exam Triage Vital Signs ED Triage Vitals  Encounter Vitals Group     BP 06/16/24 0957 (!) 138/92     Girls Systolic BP Percentile --      Girls Diastolic BP Percentile --      Boys Systolic BP Percentile --      Boys Diastolic BP Percentile --      Pulse Rate 06/16/24 0957 (!) 112     Resp 06/16/24 0957 18     Temp 06/16/24 1003 98.7 F (37.1 C)     Temp Source 06/16/24 1003 Oral     SpO2 06/16/24 0957 94 %     Weight --      Height --      Head Circumference --      Peak Flow --      Pain Score 06/16/24 0954 6     Pain Loc --      Pain Education --      Exclude from Growth Chart --    No data found.  Updated Vital Signs BP (!) 138/92 (BP Location: Left Arm)   Pulse 100   Temp 98.7 F (37.1 C) (Oral)   Resp 18   SpO2 94%     Physical Exam Vitals and nursing note reviewed.  Constitutional:      General: She is not in acute distress. HENT:     Mouth/Throat:     Pharynx: Oropharynx is clear.  Cardiovascular:     Rate and Rhythm: Normal rate and regular rhythm.     Pulses: Normal pulses.     Heart sounds: Normal heart sounds.  Pulmonary:      Effort: Pulmonary effort is normal.     Breath sounds: Normal breath sounds.  Musculoskeletal:     Left hand: Bony tenderness present. Decreased strength. Normal sensation. Normal capillary refill. Normal pulse.     Cervical back: Normal range of motion.     Lumbar back: Tenderness present. Positive right straight leg raise test.     Comments: Left arm is tender from elbow through forearm, and most painful at the left thumb and hand. Grip strength left is 3/5, right is 5/5. Radial pulses 2+, cap refill < 2 seconds. Sensation is intact throughout. There is no bony tenderness of C-L spine. Right low back muscular tenderness. +R SLR  Skin:    General: Skin is warm and dry.     Capillary Refill: Capillary refill takes less than 2 seconds.     Findings: No bruising or erythema.     Comments: Erythematous rash under breasts consistent with yeast  Neurological:     Mental Status: She is alert and oriented to person, place, and time.     UC Treatments / Results  Labs (all labs ordered are listed, but only abnormal results are displayed) Labs Reviewed - No data to display  EKG   Radiology DG Hand Complete Left Result Date: 06/16/2024 CLINICAL DATA:  Clemens.  Left thumb pain. EXAM: LEFT HAND - COMPLETE 3+ VIEW COMPARISON:  None Available. FINDINGS: The joint spaces are maintained. No acute hand or wrist fracture is identified. IMPRESSION: No acute bony findings. Electronically Signed   By: MYRTIS Stammer M.D.   On: 06/16/2024 11:14   DG Forearm Left Result Date: 06/16/2024 CLINICAL DATA:  Fall, left arm pain EXAM: LEFT FOREARM - 2 VIEW COMPARISON:  None FINDINGS: No radiographic plea apparent fracture or acute bony findings. IMPRESSION: 1. No acute findings. Electronically Signed   By: Ryan Salvage M.D.   On: 06/16/2024 10:36    Procedures Procedures (including critical care time)  Medications Ordered in UC Medications  ketorolac  (TORADOL ) 30 MG/ML injection 30 mg (30 mg  Intramuscular Given 06/16/24 1053)    Initial Impression / Assessment and Plan / UC Course  I have reviewed the triage vital signs and the nursing notes.  Pertinent labs & imaging results that were available during my care of the patient were reviewed by me and considered in my medical decision making (see chart for details).  Initially tachycardic, however during triage she was reported to be arguing on the phone. On my assessment HR is 100.  Good kidney function as of March.  IM toradol  given for pain.   Left forearm xray without bony abnormality. This imaging was ordered by staff before provider evaluation. On my assessment, most of her pain is in the left hand and thumb. Left hand xray is ordered subsequently, imaging is unremarkable. Ace wrap applied for thumb and wrist support Discussed supportive care for the left arm pain, and follow with orthopedics. Can also discuss her sciatica with them if persisting.  Advised return and ED precautions  Rash under breasts likely intertrigo Clotrimazole  is sent to pharmacy, use BID x 1-2 weeks Advised uncontrolled diabetes can contribute to these rashes Follow with her endocrine specialist. Has an appointment in 1 month.   Final Clinical Impressions(s) / UC Diagnoses   Final diagnoses:  Left hand pain  Right sided sciatica  Fall, initial encounter  Left arm pain  Intertrigo     Discharge Instructions      The toradol  injection given today will reduce pain from your fall, and from the sciatica. You can safely use tylenol  for the rest of today if needed. Use the ace wrap for the hand and thumb. Please call your orthopedic specialist to make an appointment for follow up regarding pain.   Please go to the emergency department if symptoms worsen or pain becomes severe.   Use the clotrimazole  cream on the rash under the breasts twice daily for 1-2 weeks     ED Prescriptions     Medication Sig Dispense Auth. Provider    Clotrimazole  1 % LOTN Apply to rash twice daily for 1-2 weeks 30 mL Jahiem Franzoni, Asberry, PA-C      PDMP not reviewed this encounter.   Jeryl Asberry, PA-C 06/16/24 1133

## 2024-06-16 NOTE — ED Triage Notes (Signed)
 Fall last night Legs went out under her, fell at home Has been dealing with sciatica Clemens on arm and bottom, would like to get arm checked due to arm pain   Rash under her left breast

## 2024-06-16 NOTE — Discharge Instructions (Addendum)
 The toradol  injection given today will reduce pain from your fall, and from the sciatica. You can safely use tylenol  for the rest of today if needed. Use the ace wrap for the hand and thumb. Please call your orthopedic specialist to make an appointment for follow up regarding pain.   Please go to the emergency department if symptoms worsen or pain becomes severe.   Use the clotrimazole  cream on the rash under the breasts twice daily for 1-2 weeks

## 2024-06-17 ENCOUNTER — Other Ambulatory Visit (HOSPITAL_COMMUNITY): Payer: Self-pay

## 2024-06-17 ENCOUNTER — Other Ambulatory Visit: Payer: Self-pay

## 2024-06-19 ENCOUNTER — Other Ambulatory Visit (HOSPITAL_COMMUNITY): Payer: Self-pay

## 2024-06-20 DIAGNOSIS — M25532 Pain in left wrist: Secondary | ICD-10-CM | POA: Diagnosis not present

## 2024-06-20 DIAGNOSIS — M25512 Pain in left shoulder: Secondary | ICD-10-CM | POA: Diagnosis not present

## 2024-06-24 DIAGNOSIS — M79642 Pain in left hand: Secondary | ICD-10-CM | POA: Diagnosis not present

## 2024-06-24 DIAGNOSIS — S60212A Contusion of left wrist, initial encounter: Secondary | ICD-10-CM | POA: Diagnosis not present

## 2024-06-27 DIAGNOSIS — M170 Bilateral primary osteoarthritis of knee: Secondary | ICD-10-CM | POA: Diagnosis not present

## 2024-06-28 ENCOUNTER — Other Ambulatory Visit (HOSPITAL_COMMUNITY): Payer: Self-pay

## 2024-06-28 ENCOUNTER — Other Ambulatory Visit: Payer: Self-pay | Admitting: Nurse Practitioner

## 2024-06-28 ENCOUNTER — Other Ambulatory Visit: Payer: Self-pay

## 2024-07-03 ENCOUNTER — Other Ambulatory Visit (HOSPITAL_COMMUNITY): Payer: Self-pay

## 2024-07-03 MED ORDER — ACYCLOVIR 400 MG PO TABS
400.0000 mg | ORAL_TABLET | Freq: Two times a day (BID) | ORAL | 4 refills | Status: AC
  Filled 2024-08-19: qty 30, 15d supply, fill #0
  Filled 2024-09-05: qty 30, 15d supply, fill #1
  Filled 2024-10-25: qty 30, 15d supply, fill #2
  Filled 2024-12-13: qty 30, 15d supply, fill #3

## 2024-07-05 DIAGNOSIS — M170 Bilateral primary osteoarthritis of knee: Secondary | ICD-10-CM | POA: Diagnosis not present

## 2024-07-09 ENCOUNTER — Ambulatory Visit: Admitting: Podiatry

## 2024-07-09 ENCOUNTER — Telehealth: Payer: Self-pay | Admitting: Pharmacist

## 2024-07-09 ENCOUNTER — Other Ambulatory Visit: Payer: Self-pay | Admitting: Allergy

## 2024-07-09 ENCOUNTER — Other Ambulatory Visit (HOSPITAL_COMMUNITY): Payer: Self-pay

## 2024-07-09 DIAGNOSIS — E1142 Type 2 diabetes mellitus with diabetic polyneuropathy: Secondary | ICD-10-CM | POA: Diagnosis not present

## 2024-07-09 DIAGNOSIS — M79674 Pain in right toe(s): Secondary | ICD-10-CM

## 2024-07-09 DIAGNOSIS — E119 Type 2 diabetes mellitus without complications: Secondary | ICD-10-CM

## 2024-07-09 DIAGNOSIS — L84 Corns and callosities: Secondary | ICD-10-CM | POA: Diagnosis not present

## 2024-07-09 DIAGNOSIS — M79675 Pain in left toe(s): Secondary | ICD-10-CM

## 2024-07-09 DIAGNOSIS — B351 Tinea unguium: Secondary | ICD-10-CM | POA: Diagnosis not present

## 2024-07-09 MED ORDER — ACCU-CHEK SOFTCLIX LANCETS MISC
1.0000 | Freq: Three times a day (TID) | 3 refills | Status: AC
  Filled 2024-12-04: qty 100, 34d supply, fill #0

## 2024-07-09 MED ORDER — CARBINOXAMINE MALEATE 4 MG PO TABS
8.0000 mg | ORAL_TABLET | Freq: Two times a day (BID) | ORAL | 5 refills | Status: DC
  Filled 2024-07-29: qty 120, 30d supply, fill #0

## 2024-07-09 MED ORDER — ACCU-CHEK SOFTCLIX LANCET DEV KIT: 1.0000 | PACK | Freq: Three times a day (TID) | 0 refills | Status: AC

## 2024-07-09 MED ORDER — FLUTICASONE PROPIONATE 50 MCG/ACT NA SUSP: 2.0000 | Freq: Every day | NASAL | 8 refills | Status: DC

## 2024-07-09 MED ORDER — CLOTRIMAZOLE 1 % EX CREA
1.0000 | TOPICAL_CREAM | Freq: Two times a day (BID) | CUTANEOUS | 0 refills | Status: AC
  Filled 2024-12-13: qty 28, 14d supply, fill #0

## 2024-07-09 MED ORDER — LIDOCAINE VISCOUS HCL 2 % MT SOLN: 15.0000 mL | OROMUCOSAL | 1 refills | Status: AC | PRN

## 2024-07-09 MED ORDER — INSULIN GLARGINE SOLOSTAR 100 UNIT/ML ~~LOC~~ SOPN: 10.0000 [IU] | PEN_INJECTOR | Freq: Every day | SUBCUTANEOUS | 1 refills | Status: DC

## 2024-07-09 MED ORDER — MONTELUKAST SODIUM 10 MG PO TABS
10.0000 mg | ORAL_TABLET | Freq: Every day | ORAL | 5 refills | Status: AC
  Filled 2024-08-16: qty 30, 30d supply, fill #0
  Filled 2024-09-16: qty 30, 30d supply, fill #1
  Filled 2024-10-11 (×2): qty 30, 30d supply, fill #2
  Filled 2024-11-11 (×2): qty 30, 30d supply, fill #3
  Filled 2024-12-13: qty 30, 30d supply, fill #4

## 2024-07-09 MED FILL — Famotidine Tab 20 MG: ORAL | 30 days supply | Qty: 60 | Fill #0 | Status: AC

## 2024-07-09 NOTE — Progress Notes (Addendum)
 07/09/2024  Patient ID: Pamela Roberson, female   DOB: Jun 19, 1956, 68 y.o.   MRN: [id number]  Patient was called to follow up on diabetes. She had been lost to Pharmacy follow up.  Last HgA1c was 9.1% upcoming appointment with PCP on 08/22/24.  Plan: Will follow up with Patient in 1 week.   Cassius DOROTHA Brought, PharmD, BCACP Clinical Pharmacist [phone]   ADDENDUM--Patient called me back    07/09/2024 Name: Pamela Roberson MRN: [id number] DOB: 11-12-1956  Chief Complaint  Patient presents with   Medication Management    Diabetes     Pamela Roberson is a 68 y.o. year old female who presented for a telephone visit.   They were referred to the pharmacist by their PCP for assistance in managing diabetes.    Subjective:  Care Team: Primary Care Provider: Georgina Speaks, FNP ; Next Scheduled Visit: 08/22/2024 Cardiologist: 07/10/2024; Next Scheduled Visit: Endocrinologist Dr. Motwani- 07/18/2024  Medication Access/Adherence  Current Pharmacy:  [id number] [place], [place] - [address] AT [facility] OF [address] [facility] [address] Phone: [phone] Fax: [phone]  [facility] [address], [address] [place] [address] Phone: [phone] Fax: [phone]   Patient reports affordability concerns with their medications: No  Patient reports access/transportation concerns to their pharmacy: No  Patient reports adherence concerns with their medications:  No ---Pantoprazole  and Famotine--she wanted to get at [facility] and facilitated transfer.   Diabetes:  Current medications:  Metformin  ER 500 mg 1 tablet twice daily- filled for a 60 day supply  Lantus  10 units daily  Current glucose readings: Uses Dexcom but does not have the app on her phone. She uses the reader.  She reported her morning blood sugars  being 150-170 and post prandial blood sugars as 250-270  Patient denies hypoglycemic s/sx including  dizziness, shakiness, sweating. Patient denies hyperglycemic symptoms including  polyuria, polydipsia, polyphagia, nocturia, neuropathy, blurred vision.  Current meal patterns:  - Breakfast: usually skips - Lunch -usually has brunch--black eyed peas, collards, cornbread,smoked [place] - Supper same as lunch - Snacks puffy corn - Drinks blue berry lemonade, pink lemonade, Pepsi water  Current physical activity: Patient fell recently and she has been having knee injections so not walking as much as she used to.    Hypertension: Amlodipine  5 mg 1 tablet daily Metoprolol  XL 25 mg 1 tablet daily   Hyperlipidemia/ASCVD Risk Reduction  Current lipid lowering medications:  Pravastatin  20 mg  Clinical ASCVD: No  The 10-year ASCVD risk score (Arnett DK, et al., 2019) is: 19.4%   Values used to calculate the score:     Age: 8 years     Clincally relevant sex: Female     Is Non-Hispanic African American: Yes     Diabetic: Yes     Tobacco smoker: No     Systolic Blood Pressure: 138 mmHg     Is BP treated: Yes     HDL Cholesterol: 54 mg/dL     Total Cholesterol: 138 mg/dL    Objective:  Lab Results  Component Value Date   HGBA1C 8.7 (A) 04/16/2024    Lab Results  Component Value Date   CREATININE 0.76 03/02/2024   BUN 7 (L) 03/02/2024   NA 141 03/02/2024   K 3.9 03/02/2024   CL 110 03/02/2024   CO2 23 03/02/2024    Lab Results  Component Value Date   CHOL 138 01/15/2024   HDL 54 01/15/2024   LDLCALC 60 01/15/2024   TRIG 158 (H) 01/15/2024   CHOLHDL 2.6 01/15/2024    Medications Reviewed Today     Reviewed by Jolee Cassius PARAS, [facility] (Pharmacist) on 07/09/24 at 1400  Med List Status: <None>   Medication Order Taking? Sig Documenting Provider Last Dose Status Informant  acyclovir  (ZOVIRAX ) 400 MG tablet [id number] Yes Take 1 tablet (400 mg total) by mouth 2 (two) times  daily. Georgina Speaks, FNP  Active   albuterol  (PROVENTIL ) (2.5 MG/3ML) 0.083% nebulizer solution [id number] Yes Take 3 mLs (2.5 mg total) by nebulization every 4 (four) hours as needed for wheezing or shortness of breath. Jeneal Danita Macintosh, MD  Active   albuterol  (VENTOLIN  HFA) 108 [phone] Base) MCG/ACT inhaler [id number] Yes Inhale 2 puffs into the lungs every 4 (four) hours as needed for wheezing or shortness of breath. Jeneal Danita Macintosh, MD  Active   amLODipine  (NORVASC ) 5 MG tablet [id number] Yes Take 1 tablet (5 mg total) by mouth daily. Tobb, Kardie, DO  Active   Artificial Tear Ointment (DRY EYES OP) [id number] Yes Place 1-2 drops into both eyes daily as needed (for dry eyes).  [provider]  Active Self  ascorbic acid (VITAMIN C) 1000 MG tablet [id number] Yes Take 1 tablet by mouth daily. [provider]  Active   azelastine  (OPTIVAR ) 0.05 % ophthalmic solution [id number] Yes Place 1 drop into both eyes 2 (two) times daily. Jeneal Danita Macintosh, MD  Active   Baclofen  5 MG TABS [id number] Yes Take 1 tablet (5 mg total) by mouth 2 (two) times daily as needed. Rising, Asberry, PA-C  Active   Blood Glucose Calibration (TRUE METRIX LEVEL 1) Low SOLN [id number] Yes  [provider]  Active   Blood Glucose Monitoring Suppl (TRUE METRIX METER) w/Device KIT [id number] Yes  [provider]  Active   Blood Glucose Monitoring Suppl DEVI [id number] Yes 1 each by Does not apply route in the morning, at noon, and at bedtime. May substitute to any manufacturer covered by patient's insurance. Motwani, Komal, MD  Active   budesonide -formoterol  (SYMBICORT ) 160-4.5 MCG/ACT inhaler [id number] Yes 2 puff twice a day with spacer Jeneal Danita Macintosh, MD  Active   Carbinoxamine  Maleate 4 MG TABS [id number] Yes Take 2 tablets (8 mg total) by mouth 2 (two) times daily. Jeneal Danita Macintosh, MD  Active   cholecalciferol (VITAMIN D3) 25 MCG (1000 UNIT) tablet [id number]  Yes Take 2,000 Units by mouth daily. [provider]  Active Self  Clotrimazole  1 % LOTN [id number] Yes Apply to rash twice daily for 1-2 weeks Rising, Asberry, PA-C  Active   Continuous Glucose Receiver (DEXCOM G7 RECEIVER) DEVI [id number] Yes 1 Device by Does not apply route continuous. Motwani, Komal, MD  Active   Continuous Glucose Sensor (DEXCOM G7 SENSOR) MISC [id number] Yes 1 Device by Does not apply route continuous. Motwani, Komal, MD  Active   cycloSPORINE (RESTASIS) 0.05 % ophthalmic emulsion [id number] Yes Place 1 drop into both eyes 2 times daily. [provider]  Active   EPINEPHrine  0.3 mg/0.3 mL IJ SOAJ injection [id number] Yes Inject 0.3 mg into the muscle as needed for anaphylaxis. Jeneal Danita Macintosh, MD  Active    Patient not taking:   Discontinued 08/06/20 2039 famotidine  (PEPCID ) 20 MG tablet [id number] Yes TAKE 1 TABLET BY MOUTH 1 TO 2 TIMES DAILY Padgett, Danita Macintosh, MD  Active   fluticasone  (FLONASE ) 50  MCG/ACT nasal spray [id number] Yes Place 2 sprays into both nostrils daily.   Active   Glucose Blood (BLOOD GLUCOSE TEST STRIPS) STRP [id number] Yes 1 each by In Vitro route in the morning, at noon, and at bedtime. May substitute to any manufacturer covered by patient's insurance. Motwani, Komal, MD  Active   insulin  glargine (LANTUS ) 100 UNIT/ML Solostar Pen [id number] Yes Inject 10 Units into the skin daily.  Patient taking differently: Inject 14 Units into the skin daily.   Motwani, Komal, MD  Active   Insulin  Pen Needle (PEN NEEDLES) 32G X 4 MM MISC [id number] Yes 1 Device by Does not apply route daily. Dartha Ernst, MD  Active   Insulin  Pen Needle 32G X 4 MM MISC [id number] Yes Use 1 pen needle daily.   Active   metFORMIN  (GLUCOPHAGE -XR) 500 MG 24 hr tablet [id number] Yes Take 1 tablet (500 mg total) by mouth 2 (two) times daily with a meal. Motwani, Komal, MD  Active   metoprolol  succinate (TOPROL -XL) 25 MG 24 hr tablet [id number] Yes Take 1 tablet (25  mg total) by mouth daily. Tobb, Kardie, DO  Active   montelukast  (SINGULAIR ) 10 MG tablet [id number] Yes Take 1 tablet (10 mg total) by mouth daily. Jeneal Danita Macintosh, MD  Active   Multiple Vitamin (MULTIVITAMIN WITH MINERALS) TABS tablet [id number] Yes Take 1 tablet by mouth daily. [provider]  Active Self  ondansetron  (ZOFRAN -ODT) 4 MG disintegrating tablet [id number] Yes Take 1 tablet (4 mg total) by mouth every 8 (eight) hours as needed for nausea or vomiting. White, Elizabeth A, PA-C  Active   pantoprazole  (PROTONIX ) 40 MG tablet [id number] Yes Take 1 tablet (40 mg total) by mouth daily. Jeneal Danita Macintosh, MD  Active   pravastatin  (PRAVACHOL ) 20 MG tablet [id number] Yes Take 1 tablet (20 mg total) by mouth every evening. Tobb, Kardie, DO  Active   TRUE METRIX BLOOD GLUCOSE TEST test strip [id number] Yes 1 each by Other route 3 (three) times daily. Georgina Speaks, FNP  Active   TRUEplus Lancets 33G MISC [id number] Yes Apply 1 each topically 3 (three) times daily. Georgina Speaks, FNP  Active               Assessment/Plan:   Diabetes: - Currently uncontrolled A1c-8.7% - Reviewed long term cardiovascular and renal outcomes of uncontrolled blood sugar - Reviewed goal A1c, goal fasting, and goal 2 hour post prandial glucose - Reviewed dietary modifications including decreasing sweet drinks - Recommend to continue current therapy ---she will see Endocrinology and they will likely increase her insulin .      Hypertension: - Currently controlled - Reviewed long term cardiovascular and renal outcomes of uncontrolled blood pressure - Reviewed appropriate blood pressure monitoring technique and reviewed goal blood pressure. Recommended to check home blood pressure and heart rate   - Recommend to continue current therapy.    Hyperlipidemia/ASCVD Risk Reduction: - Currently uncontrolled.  LDL 60 TG 158--on Pravastatin  20 mg filled 05/25 for a 90 day supply --may consider  increasing pravastatin  dose. Or switching to a more potent statin.  Has been on atorvastatin --caused muscle aches. (SUPD gap closed)   Follow Up Plan:    Call Patient after her appointment with Dr. Dartha.  Cassius DOROTHA Brought, PharmD, BCACP Clinical Pharmacist [phone]

## 2024-07-10 ENCOUNTER — Other Ambulatory Visit (HOSPITAL_COMMUNITY): Payer: Self-pay

## 2024-07-10 ENCOUNTER — Encounter: Payer: Self-pay | Admitting: Cardiology

## 2024-07-10 ENCOUNTER — Ambulatory Visit (INDEPENDENT_AMBULATORY_CARE_PROVIDER_SITE_OTHER)

## 2024-07-10 ENCOUNTER — Ambulatory Visit: Attending: Cardiology | Admitting: Cardiology

## 2024-07-10 VITALS — BP 120/78 | HR 90 | Ht 66.0 in | Wt 199.0 lb

## 2024-07-10 DIAGNOSIS — Z794 Long term (current) use of insulin: Secondary | ICD-10-CM | POA: Diagnosis not present

## 2024-07-10 DIAGNOSIS — G4733 Obstructive sleep apnea (adult) (pediatric): Secondary | ICD-10-CM | POA: Diagnosis not present

## 2024-07-10 DIAGNOSIS — R072 Precordial pain: Secondary | ICD-10-CM | POA: Diagnosis not present

## 2024-07-10 DIAGNOSIS — R002 Palpitations: Secondary | ICD-10-CM | POA: Diagnosis not present

## 2024-07-10 DIAGNOSIS — E119 Type 2 diabetes mellitus without complications: Secondary | ICD-10-CM | POA: Diagnosis not present

## 2024-07-10 DIAGNOSIS — Z01812 Encounter for preprocedural laboratory examination: Secondary | ICD-10-CM | POA: Diagnosis not present

## 2024-07-10 DIAGNOSIS — I10 Essential (primary) hypertension: Secondary | ICD-10-CM

## 2024-07-10 DIAGNOSIS — R079 Chest pain, unspecified: Secondary | ICD-10-CM | POA: Diagnosis not present

## 2024-07-10 MED ORDER — NITROGLYCERIN 0.4 MG SL SUBL
0.4000 mg | SUBLINGUAL_TABLET | SUBLINGUAL | 3 refills | Status: AC | PRN
  Filled 2024-07-10: qty 25, 1d supply, fill #0

## 2024-07-10 MED ORDER — NITROGLYCERIN 0.4 MG SL SUBL
0.4000 mg | SUBLINGUAL_TABLET | SUBLINGUAL | 3 refills | Status: DC | PRN
  Filled 2024-07-10: qty 25, 10d supply, fill #0
  Filled 2024-07-10: qty 75, 90d supply, fill #0

## 2024-07-10 MED ORDER — PANTOPRAZOLE SODIUM 40 MG PO TBEC
40.0000 mg | DELAYED_RELEASE_TABLET | Freq: Every day | ORAL | 3 refills | Status: AC
  Filled 2024-07-10: qty 30, 30d supply, fill #0
  Filled 2024-09-05: qty 30, 30d supply, fill #1
  Filled 2024-10-04: qty 30, 30d supply, fill #2
  Filled 2024-12-13: qty 30, 30d supply, fill #3

## 2024-07-10 MED ORDER — METOPROLOL TARTRATE 100 MG PO TABS
100.0000 mg | ORAL_TABLET | Freq: Once | ORAL | 0 refills | Status: DC
  Filled 2024-07-10: qty 1, 1d supply, fill #0

## 2024-07-10 NOTE — Addendum Note (Signed)
 Addended by: MELIDA ROLIN HERO on: 07/10/2024 02:05 PM   Modules accepted: Orders

## 2024-07-10 NOTE — Progress Notes (Signed)
 Cardiology Office Note:    Date:  07/10/2024   ID:  Pamela Roberson, DOB August 27, 1956, MRN [id number]  PCP:  Georgina Speaks, FNP  Cardiologist:  Briauna Gilmartin, DO  Electrophysiologist:  None   Referring MD: Georgina Speaks, FNP    I am ok - just establishing care   History of Present Illness:    Pamela Roberson is a 68 y.o. female with a hx of COPD, DMII, HTN, HLD, OSA and asthma who presents to clinic for follow-up.   She reports that she has been experiencing intermittent chest discomfort comes and goes.  Nothing makes it better but sometimes rests.  She is not experiencing that today.  But it is often at least once or twice a week.  The other issue that she complains of is the fact that she is experiencing intermittent palpitations.  She described that as the above onset of fast heartbeat which lasts for minutes at a time.  Past Medical History:  Diagnosis Date   Arthritis    Asthma    Carotid stenosis, bilateral    Carpal tunnel syndrome    Chronic low back pain    COPD (chronic obstructive pulmonary disease) (HCC)    Diabetes mellitus without complication (HCC)    type II    Diabetic neuropathy (HCC)    Diverticulitis    Dyspnea    mild    Frequent falls    GERD (gastroesophageal reflux disease)    History of bronchitis    Hypercholesterolemia    Hypertension    IBS (irritable bowel syndrome)    Osteoarthritis    knee   Palpitations    Right ventricular enlargement    per pt report   Sleep apnea    mild but no sleep apnea    TMJ (dislocation of temporomandibular joint)    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    Vitamin D deficiency     Past Surgical History:  Procedure Laterality Date   ABDOMINAL HYSTERECTOMY  1993   CARPAL TUNNEL RELEASE Right    CERVICAL DISC SURGERY  04/29/2015   [place]le [place]   ESOPHAGEAL MANOMETRY N/A 03/03/2021   Procedure: ESOPHAGEAL MANOMETRY (EM);  Surgeon: Dianna Specking, MD;  Location: [facility];   Service: Endoscopy;  Laterality: N/A;   ESOPHAGOGASTRODUODENOSCOPY ENDOSCOPY     with esophagus being stretched twice per pt,    EYE SURGERY     fatty 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      feeding tube placement and removal   2015   NASAL SEPTUM SURGERY     ROTATOR CUFF REPAIR Right    spurs removed from esophagus   2015   TONSILLECTOMY      Current Medications: Current Meds  Medication Sig   Accu-Chek Softclix Lancets lancets Use to check blood sugar 3 (three) times daily (morning, noon, and bedtime)   acyclovir  (ZOVIRAX ) 400 MG tablet Take 1 tablet (400 mg total) by mouth 2 (two) times daily.   albuterol  (PROVENTIL ) (2.5 MG/3ML) 0.083% nebulizer solution Take 3 mLs (2.5 mg total) by nebulization every 4 (four) hours as needed for wheezing or shortness of breath.   albuterol  (VENTOLIN  HFA) 108 (90 Base) MCG/ACT inhaler Inhale 2 puffs into the lungs every 4 (four) hours as needed for wheezing or shortness of breath.   amLODipine  (NORVASC ) 5 MG tablet Take 1 tablet (5 mg total) by mouth daily.   Artificial Tear Ointment (DRY EYES OP) Place 1-2 drops into  both eyes daily as needed (for dry eyes).    ascorbic acid (VITAMIN C) 1000 MG tablet Take 1 tablet by mouth daily.   azelastine  (OPTIVAR ) 0.05 % ophthalmic solution Place 1 drop into both eyes 2 (two) times daily.   Baclofen  5 MG TABS Take 1 tablet (5 mg total) by mouth 2 (two) times daily as needed.   Blood Glucose Calibration (TRUE METRIX LEVEL 1) Low SOLN    Blood Glucose Monitoring Suppl (TRUE METRIX METER) w/Device KIT    Blood Glucose Monitoring Suppl DEVI 1 each by Does not apply route in the morning, at noon, and at bedtime. May substitute to any manufacturer covered by patient's insurance.   budesonide -formoterol  (SYMBICORT ) 160-4.5 MCG/ACT inhaler Inhale 2 puffs into the lungs 2 (two) times daily with a spacer.   Carbinoxamine  Maleate 4 MG TABS Take 2 tablets (8 mg total) by mouth 2 (two) times daily.   Carbinoxamine  Maleate 4 MG  TABS Take 2 tablets (8 mg total) by mouth 2 (two) times daily.   cholecalciferol (VITAMIN D3) 25 MCG (1000 UNIT) tablet Take 2,000 Units by mouth daily.   clotrimazole  (LOTRIMIN ) 1 % cream Apply 1 Application topically to rash 2 (two) times daily.   Clotrimazole  1 % LOTN Apply to rash twice daily for 1-2 weeks   Continuous Glucose Receiver (DEXCOM G7 RECEIVER) DEVI 1 Device by Does not apply route continuous.   Continuous Glucose Sensor (DEXCOM G7 SENSOR) MISC 1 Device by Does not apply route continuous.   cycloSPORINE (RESTASIS) 0.05 % ophthalmic emulsion Place 1 drop into both eyes 2 times daily.   EPINEPHrine  0.3 mg/0.3 mL IJ SOAJ injection Inject 0.3 mg into the muscle as needed for anaphylaxis.   famotidine  (PEPCID ) 20 MG tablet Take 1 tablet (20 mg total) by mouth 1 to 2 times daily.   fluticasone  (FLONASE ) 50 MCG/ACT nasal spray Place 2 sprays into both nostrils daily.   fluticasone  (FLONASE ) 50 MCG/ACT nasal spray Place 2 sprays into both nostrils daily.   Glucose Blood (BLOOD GLUCOSE TEST STRIPS) STRP 1 each by In Vitro route in the morning, at noon, and at bedtime. May substitute to any manufacturer covered by patient's insurance.   insulin  glargine (LANTUS ) 100 UNIT/ML Solostar Pen Inject 10 Units into the skin daily. (Patient taking differently: Inject 14 Units into the skin daily.)   Insulin  Glargine Solostar (LANTUS ) 100 UNIT/ML Solostar Pen Inject 10 Units into the skin daily.   Insulin  Pen Needle (PEN NEEDLES) 32G X 4 MM MISC 1 Device by Does not apply route daily.   Insulin  Pen Needle 32G X 4 MM MISC Use 1 pen needle daily.   Lancets Misc. (ACCU-CHEK SOFTCLIX LANCET DEV) KIT Use to check blood sugar in the morning, at noon, and at bedtime.   lidocaine  (XYLOCAINE ) 2 % solution Rinse and gargle with 56ml's as directed in the mouth or throat every 4 hours as needed.   metFORMIN  (GLUCOPHAGE -XR) 500 MG 24 hr tablet Take 1 tablet (500 mg total) by mouth 2 (two) times daily with a meal.    metoprolol  succinate (TOPROL -XL) 25 MG 24 hr tablet Take 1 tablet (25 mg total) by mouth daily.   montelukast  (SINGULAIR ) 10 MG tablet Take 1 tablet (10 mg total) by mouth daily.   montelukast  (SINGULAIR ) 10 MG tablet Take 1 tablet (10 mg total) by mouth daily.   Multiple Vitamin (MULTIVITAMIN WITH MINERALS) TABS tablet Take 1 tablet by mouth daily.   ondansetron  (ZOFRAN -ODT) 4 MG disintegrating tablet Take 1  tablet (4 mg total) by mouth every 8 (eight) hours as needed for nausea or vomiting.   pantoprazole  (PROTONIX ) 40 MG tablet Take 1 tablet (40 mg total) by mouth daily.   pravastatin  (PRAVACHOL ) 20 MG tablet Take 1 tablet (20 mg total) by mouth every evening.   TRUE METRIX BLOOD GLUCOSE TEST test strip 1 each by Other route 3 (three) times daily.   TRUEplus Lancets 33G MISC Apply 1 each topically 3 (three) times daily.     Allergies:   Crab [shellfish allergy], Erythromycin, Hm lidocaine  patch [lidocaine ], Fish allergy, Metoclopramide, Sulfacetamide, Sulfacetamide sodium-sulfur, Doxycycline , Levaquin [levofloxacin in d5w], Morphine, and Sulfa antibiotics   Social History   Socioeconomic History   Marital status: Widowed    Spouse name: Not on file   Number of children: 3   Years of education: Not on file   Highest education level: 11th grade  Occupational History    Comment: NA  Tobacco Use   Smoking status: Former    Current packs/day: 0.00    Average packs/day: 0.5 packs/day for 30.0 years (15.0 ttl pk-yrs)    Types: Cigarettes    Start date: 12/08/1964    Quit date: 12/08/1994    Years since quitting: 29.6   Smokeless tobacco: Never  Vaping Use   Vaping status: Never Used  Substance and Sexual Activity   Alcohol use: No   Drug use: No   Sexual activity: Not Currently  Other Topics Concern   Not on file  Social History Narrative   Lives with daughter   Caffeine- coffee 12 oz   Social Drivers of Corporate investment banker Strain: Low Risk  (02/09/2024)   Overall  Financial Resource Strain (CARDIA)    Difficulty of Paying Living Expenses: Not very hard  Food Insecurity: Food Insecurity Present (02/09/2024)   Hunger Vital Sign    Worried About Running Out of Food in the Last Year: Never true    Ran Out of Food in the Last Year: Sometimes true  Transportation Needs: No Transportation Needs (02/09/2024)   PRAPARE - Administrator, Civil Service (Medical): No    Lack of Transportation (Non-Medical): No  Physical Activity: Sufficiently Active (02/09/2024)   Exercise Vital Sign    Days of Exercise per Week: 4 days    Minutes of Exercise per Session: 40 min  Stress: No Stress Concern Present (02/09/2024)   [employer] of Occupational Health - Occupational Stress Questionnaire    Feeling of Stress : Not at all  Social Connections: Moderately Integrated (02/09/2024)   Social Connection and Isolation Panel    Frequency of Communication with Friends and Family: Twice a week    Frequency of Social Gatherings with Friends and Family: Twice a week    Attends Religious Services: More than 4 times per year    Active Member of [employer] or Organizations: Yes    Attends Banker Meetings: More than 4 times per year    Marital Status: Widowed     Family History: The patient's family history includes Allergic rhinitis in her father, mother, and sister; Collagen disease in her sister; Diabetes in her father; Heart disease in her brother and father; Hypertension in her brother and father. There is no history of Angioedema, Asthma, Atopy, Eczema, Immunodeficiency, or Urticaria.  ROS:   Review of Systems  Constitution: Negative for decreased appetite, fever and weight gain.  HENT: Negative for congestion, ear discharge, hoarse voice and sore throat.   Eyes: Negative  for discharge, redness, vision loss in right eye and visual halos.  Cardiovascular: Negative for chest pain, dyspnea on exertion, leg swelling, orthopnea and palpitations.  Respiratory:  Negative for cough, hemoptysis, shortness of breath and snoring.   Endocrine: Negative for heat intolerance and polyphagia.  Hematologic/Lymphatic: Negative for bleeding problem. Does not bruise/bleed easily.  Skin: Negative for flushing, nail changes, rash and suspicious lesions.  Musculoskeletal: Negative for arthritis, joint pain, muscle cramps, myalgias, neck pain and stiffness.  Gastrointestinal: Negative for abdominal pain, bowel incontinence, diarrhea and excessive appetite.  Genitourinary: Negative for decreased libido, genital sores and incomplete emptying.  Neurological: Negative for brief paralysis, focal weakness, headaches and loss of balance.  Psychiatric/Behavioral: Negative for altered mental status, depression and suicidal ideas.  Allergic/Immunologic: Negative for HIV exposure and persistent infections.    EKGs/Labs/Other Studies Reviewed:    The following studies were reviewed today:   EKG:  None today   Recent Labs: 03/02/2024: ALT 21; BUN 7; Creatinine, Ser 0.76; Hemoglobin 16.1; Platelets 297; Potassium 3.9; Sodium 141  Recent Lipid Panel    Component Value Date/Time   CHOL 138 01/15/2024 1237   CHOL 114 08/21/2023 1156   TRIG 158 (H) 01/15/2024 1237   HDL 54 01/15/2024 1237   HDL 56 08/21/2023 1156   CHOLHDL 2.6 01/15/2024 1237   LDLCALC 60 01/15/2024 1237    Physical Exam:    VS:  BP 120/78 (BP Location: Left Arm, Patient Position: Sitting)   Pulse 90   Ht 5' 6 (1.676 m)   Wt 199 lb (90.3 kg)   SpO2 98%   BMI 32.12 kg/m     Wt Readings from Last 3 Encounters:  07/10/24 199 lb (90.3 kg)  04/18/24 201 lb 6.4 oz (91.4 kg)  04/16/24 203 lb (92.1 kg)     GEN: Well nourished, well developed in no acute distress HEENT: Normal NECK: No JVD; No carotid bruits LYMPHATICS: No lymphadenopathy CARDIAC: S1S2 noted,RRR, no murmurs, rubs, gallops RESPIRATORY:  Clear to auscultation without rales, wheezing or rhonchi  ABDOMEN: Soft, non-tender,  non-distended, +bowel sounds, no guarding. EXTREMITIES: No edema, No cyanosis, no clubbing MUSCULOSKELETAL:  No deformity  SKIN: Warm and dry NEUROLOGIC:  Alert and oriented x 3, non-focal PSYCHIATRIC:  Normal affect, good insight  ASSESSMENT:    1. Chest pain of uncertain etiology   2. Insulin -requiring or dependent type II diabetes mellitus (HCC)   3. Primary hypertension   4. OSA (obstructive sleep apnea)     PLAN:    The symptoms chest pain is concerning, this patient does have intermediate risk to high for coronary artery disease and at this time I would like to pursue an ischemic evaluation in this patient.  Shared decision a coronary CTA at this time is appropriate.  I have discussed with the patient about the testing.  The patient has no IV contrast allergy and is agreeable to proceed with this test.  I would like to rule out a cardiovascular etiology of this palpitation, therefore at this time I would like to placed a zio patch for   14 days. I  Please give her nitroglycerin  for now, I do reccomend that the patient goes to the nearest ED if  symptoms recur.  Sublingual nitroglycerin  prescription was sent, its protocol and 911 protocol explained and the patient vocalized understanding questions were answered to the patient's satisfaction  The patient is in agreement with the above plan. The patient left the office in stable condition.  The patient will follow  up in   Medication Adjustments/Labs and Tests Ordered: Current medicines are reviewed at length with the patient today.  Concerns regarding medicines are outlined above.  No orders of the defined types were placed in this encounter.  No orders of the defined types were placed in this encounter.   Patient Instructions  Medication Instructions:  Your physician has recommended you make the following change in your medication:  START: Nitroglycerin  0.4 mg place 1 tablet under your tongue every 5 minutes (up to three  times) as needed for chest pain.  *If you need a refill on your cardiac medications before your next appointment, please call your pharmacy*  Lab Work: CMET, Mag If you have labs (blood work) drawn today and your tests are completely normal, you will receive your results only by: MyChart Message (if you have MyChart) OR A paper copy in the mail If you have any lab test that is abnormal or we need to change your treatment, we will call you to review the results.  Testing/Procedures: Your physician has requested that you have cardiac CT. Cardiac computed tomography (CT) is a painless test that uses an x-ray machine to take clear, detailed pictures of your heart. For further information please visit [email or web address] Please follow instruction sheet as given.  ZIO XT- Long Term Monitor Instructions  Your physician has requested you wear a ZIO patch monitor for 14 days.  This is a single patch monitor. [employer] supplies one patch monitor per enrollment. Additional stickers are not available. Please do not apply patch if you will be having a Nuclear Stress Test,  Echocardiogram, Cardiac CT, MRI, or Chest Xray during the period you would be wearing the  monitor. The patch cannot be worn during these tests. You cannot remove and re-apply the  ZIO XT patch monitor.  Your ZIO patch monitor will be mailed 3 day USPS to your address on file. It may take 3-5 days  to receive your monitor after you have been enrolled.  Once you have received your monitor, please review the enclosed instructions. Your monitor  has already been registered assigning a specific monitor serial # to you.  Billing and Patient Assistance Program Information  We have supplied [employer] with any of your insurance information on file for billing purposes. [employer] offers a sliding scale Patient Assistance Program for patients that do not have  insurance, or whose insurance does not completely cover the cost of the ZIO monitor.   You must apply for the Patient Assistance Program to qualify for this discounted rate.  To apply, please call [employer] at [phone], select option 4, select option 2, ask to apply for  Patient Assistance Program. Meredeth will ask your household income, and how many people  are in your household. They will quote your out-of-pocket cost based on that information.  [employer] will also be able to set up a 27-month, interest-free payment plan if needed.  Applying the monitor   Shave hair from upper left chest.  Hold abrader disc by orange tab. Rub abrader in 40 strokes over the upper left chest as  indicated in your monitor instructions.  Clean area with 4 enclosed alcohol pads. Let dry.  Apply patch as indicated in monitor instructions. Patch will be placed under collarbone on left  side of chest with arrow pointing upward.  Rub patch adhesive wings for 2 minutes. Remove white label marked 1. Remove the white  label marked 2. Rub patch adhesive wings for 2 additional minutes.  While looking  in a mirror, press and release button in center of patch. A small green light will  flash 3-4 times. This will be your only indicator that the monitor has been turned on.  Do not shower for the first 24 hours. You may shower after the first 24 hours.  Press the button if you feel a symptom. You will hear a small click. Record Date, Time and  Symptom in the Patient Logbook.  When you are ready to remove the patch, follow instructions on the last 2 pages of Patient  Logbook. Stick patch monitor onto the last page of Patient Logbook.  Place Patient Logbook in the blue and white box. Use locking tab on box and tape box closed  securely. The blue and white box has prepaid postage on it. Please place it in the mailbox as  soon as possible. Your physician should have your test results approximately 7 days after the  monitor has been mailed back to [facility].  Call [facility] Customer Care at  [phone] if you have questions regarding  your ZIO XT patch monitor. Call them immediately if you see an orange light blinking on your  monitor.  If your monitor falls off in less than 4 days, contact our Monitor department at [phone].  If your monitor becomes loose or falls off after 4 days call [employer] at [phone] for  suggestions on securing your monitor   Follow-Up: At [facility], you and your health needs are our priority.  As part of our continuing mission to provide you with exceptional heart care, our providers are all part of one team.  This team includes your primary Cardiologist (physician) and Advanced Practice Providers or APPs (Physician Assistants and Nurse Practitioners) who all work together to provide you with the care you need, when you need it.  Your next appointment:   16 week(s)  Provider:   Janelys Glassner, DO      Adopting a Healthy Lifestyle.  Know what a healthy weight is for you (roughly BMI <25) and aim to maintain this   Aim for 7+ servings of fruits and vegetables daily   65-80+ fluid ounces of water or unsweet tea for healthy kidneys   Limit to max 1 drink of alcohol per day; avoid smoking/tobacco   Limit animal fats in diet for cholesterol and heart health - choose grass fed whenever available   Avoid highly processed foods, and foods high in saturated/trans fats   Aim for low stress - take time to unwind and care for your mental health   Aim for 150 min of moderate intensity exercise weekly for heart health, and weights twice weekly for bone health   Aim for 7-9 hours of sleep daily   When it comes to diets, agreement about the perfect plan isnt easy to find, even among the experts. Experts at the [facility] [employer] developed an idea known as the Healthy Eating Plate. Just imagine a plate divided into logical, healthy portions.   The emphasis is on diet quality:   Load up on vegetables and fruits -  one-half of your plate: Aim for color and variety, and remember that potatoes dont count.   Go for whole grains - one-quarter of your plate: Whole wheat, barley, wheat berries, quinoa, oats, brown rice, and foods made with them. If you want pasta, go with whole wheat pasta.   Protein power - one-quarter of your plate: Fish, chicken, beans, and nuts are all healthy, versatile protein  sources. Limit red meat.   The diet, however, does go beyond the plate, offering a few other suggestions.   Use healthy plant oils, such as olive, canola, soy, corn, sunflower and peanut. Check the labels, and avoid partially hydrogenated oil, which have unhealthy trans fats.   If youre thirsty, drink water. Coffee and tea are good in moderation, but skip sugary drinks and limit milk and dairy products to one or two daily servings.   The type of carbohydrate in the diet is more important than the amount. Some sources of carbohydrates, such as vegetables, fruits, whole grains, and beans-are healthier than others.   Finally, stay active  Signed, Vaeda Westall, DO  07/10/2024 8:51 AM    [facility]

## 2024-07-10 NOTE — Patient Instructions (Addendum)
 Medication Instructions:  Your physician has recommended you make the following change in your medication:  START: Nitroglycerin  0.4 mg place 1 tablet under your tongue every 5 minutes (up to three times) as needed for chest pain.  *If you need a refill on your cardiac medications before your next appointment, please call your pharmacy*  Lab Work: CMET, Mag If you have labs (blood work) drawn today and your tests are completely normal, you will receive your results only by: MyChart Message (if you have MyChart) OR A paper copy in the mail If you have any lab test that is abnormal or we need to change your treatment, we will call you to review the results.  Testing/Procedures: Your physician has requested that you have cardiac CT. Cardiac computed tomography (CT) is a painless test that uses an x-ray machine to take clear, detailed pictures of your heart. For further information please visit [email or web address] Please follow instruction sheet as given.  ZIO XT- Long Term Monitor Instructions  Your physician has requested you wear a ZIO patch monitor for 14 days.  This is a single patch monitor. [employer] supplies one patch monitor per enrollment. Additional stickers are not available. Please do not apply patch if you will be having a Nuclear Stress Test,  Echocardiogram, Cardiac CT, MRI, or Chest Xray during the period you would be wearing the  monitor. The patch cannot be worn during these tests. You cannot remove and re-apply the  ZIO XT patch monitor.  Your ZIO patch monitor will be mailed 3 day USPS to your address on file. It may take 3-5 days  to receive your monitor after you have been enrolled.  Once you have received your monitor, please review the enclosed instructions. Your monitor  has already been registered assigning a specific monitor serial # to you.  Billing and Patient Assistance Program Information  We have supplied [employer] with any of your insurance information on  file for billing purposes. [employer] offers a sliding scale Patient Assistance Program for patients that do not have  insurance, or whose insurance does not completely cover the cost of the ZIO monitor.  You must apply for the Patient Assistance Program to qualify for this discounted rate.  To apply, please call [employer] at [phone], select option 4, select option 2, ask to apply for  Patient Assistance Program. Meredeth will ask your household income, and how many people  are in your household. They will quote your out-of-pocket cost based on that information.  [employer] will also be able to set up a 34-month, interest-free payment plan if needed.  Applying the monitor   Shave hair from upper left chest.  Hold abrader disc by orange tab. Rub abrader in 40 strokes over the upper left chest as  indicated in your monitor instructions.  Clean area with 4 enclosed alcohol pads. Let dry.  Apply patch as indicated in monitor instructions. Patch will be placed under collarbone on left  side of chest with arrow pointing upward.  Rub patch adhesive wings for 2 minutes. Remove white label marked 1. Remove the white  label marked 2. Rub patch adhesive wings for 2 additional minutes.  While looking in a mirror, press and release button in center of patch. A small green light will  flash 3-4 times. This will be your only indicator that the monitor has been turned on.  Do not shower for the first 24 hours. You may shower after the first 24 hours.  Press the button if you  feel a symptom. You will hear a small click. Record Date, Time and  Symptom in the Patient Logbook.  When you are ready to remove the patch, follow instructions on the last 2 pages of Patient  Logbook. Stick patch monitor onto the last page of Patient Logbook.  Place Patient Logbook in the blue and white box. Use locking tab on box and tape box closed  securely. The blue and white box has prepaid postage on it. Please place it in the  mailbox as  soon as possible. Your physician should have your test results approximately 7 days after the  monitor has been mailed back to [facility].  Call [facility] Customer Care at [phone] if you have questions regarding  your ZIO XT patch monitor. Call them immediately if you see an orange light blinking on your  monitor.  If your monitor falls off in less than 4 days, contact our Monitor department at [phone].  If your monitor becomes loose or falls off after 4 days call [employer] at [phone] for  suggestions on securing your monitor   Follow-Up: At [facility], you and your health needs are our priority.  As part of our continuing mission to provide you with exceptional heart care, our providers are all part of one team.  This team includes your primary Cardiologist (physician) and Advanced Practice Providers or APPs (Physician Assistants and Nurse Practitioners) who all work together to provide you with the care you need, when you need it.  Your next appointment:   16 week(s)  Provider:   Kardie Tobb, DO      Your cardiac CT will be scheduled at one of the below locations:   [facility] [address] [place], [place] [address] [phone]  or  [facility] [address]  [place], [place] [address]  If scheduled at [facility], please arrive at the [facility] Entrance (Entrance C2) of [facility] 30 minutes prior to test start time. You can use the FREE valet parking offered at entrance C (encouraged to control the heart rate for the test)  Proceed to the [facility] Radiology Department (first floor) to check-in and test prep.  All radiology patients and guests should use entrance C2 at [facility], accessed from [facility], even though the hospital's physical address listed is [address]  If scheduled at the Heart and Vascular  Tower at [address], please enter the parking lot using the [address] entrance and use the FREE valet service at the patient drop-off area. Enter the building and check-in with registration on the main floor.   Please follow these instructions carefully (unless otherwise directed):  An IV will be required for this test and Nitroglycerin  will be given.  Hold all erectile dysfunction medications at least 3 days (72 hrs) prior to test. (Ie viagra, cialis, sildenafil, tadalafil, etc)   On the Night Before the Test: Be sure to Drink plenty of water. Do not consume any caffeinated/decaffeinated beverages or chocolate 12 hours prior to your test. Do not take any antihistamines 12 hours prior to your test.  On the Day of the Test: Drink plenty of water until 1 hour prior to the test. Do not eat any food 1 hour prior to test. You may take your regular medications prior to the test.  Take metoprolol  (Lopressor ) two hours prior to test. Patients who wear a continuous glucose monitor MUST remove  the device prior to scanning. FEMALES- please wear underwire-free bra if available, avoid dresses & tight clothing       After the Test: Drink plenty of water. After receiving IV contrast, you may experience a mild flushed feeling. This is normal. On occasion, you may experience a mild rash up to 24 hours after the test. This is not dangerous. If this occurs, you can take Benadryl  25 mg, Zyrtec , Claritin , or Allegra  and increase your fluid intake. (Patients taking Tikosyn should avoid Benadryl , and may take Zyrtec , Claritin , or Allegra ) If you experience trouble breathing, this can be serious. If it is severe call 911 IMMEDIATELY. If it is mild, please call our office.  We will call to schedule your test 2-4 weeks out understanding that some insurance companies will need an authorization prior to the service being performed.   For more information and frequently asked questions, please visit our  website : [email or web address]  For non-scheduling related questions, please contact the cardiac imaging nurse navigator should you have any questions/concerns: Cardiac Imaging Nurse Navigators Direct Office Dial: [phone]   For scheduling needs, including cancellations and rescheduling, please call [place], [phone].

## 2024-07-10 NOTE — Progress Notes (Unsigned)
 Enrolled patient for a 14 day Zio XT  monitor to be mailed to patients home

## 2024-07-11 ENCOUNTER — Other Ambulatory Visit: Payer: Self-pay | Admitting: Cardiology

## 2024-07-11 ENCOUNTER — Other Ambulatory Visit (HOSPITAL_COMMUNITY): Payer: Self-pay

## 2024-07-11 ENCOUNTER — Telehealth: Payer: Self-pay | Admitting: Pharmacist

## 2024-07-11 ENCOUNTER — Ambulatory Visit: Payer: Self-pay | Admitting: Cardiology

## 2024-07-11 DIAGNOSIS — L84 Corns and callosities: Secondary | ICD-10-CM | POA: Diagnosis not present

## 2024-07-11 DIAGNOSIS — M2141 Flat foot [pes planus] (acquired), right foot: Secondary | ICD-10-CM | POA: Diagnosis not present

## 2024-07-11 DIAGNOSIS — E1142 Type 2 diabetes mellitus with diabetic polyneuropathy: Secondary | ICD-10-CM | POA: Diagnosis not present

## 2024-07-11 DIAGNOSIS — E1140 Type 2 diabetes mellitus with diabetic neuropathy, unspecified: Secondary | ICD-10-CM

## 2024-07-11 LAB — COMPREHENSIVE METABOLIC PANEL WITH GFR
ALT: 12 IU/L (ref 0–32)
AST: 16 IU/L (ref 0–40)
Albumin: 4.3 g/dL (ref 3.9–4.9)
Alkaline Phosphatase: 109 IU/L (ref 44–121)
BUN/Creatinine Ratio: 11 — AB (ref 12–28)
BUN: 9 mg/dL (ref 8–27)
Bilirubin Total: 0.5 mg/dL (ref 0.0–1.2)
CO2: 19 mmol/L — AB (ref 20–29)
Calcium: 10.1 mg/dL (ref 8.7–10.3)
Chloride: 103 mmol/L (ref 96–106)
Creatinine, Ser: 0.79 mg/dL (ref 0.57–1.00)
Globulin, Total: 2.5 g/dL (ref 1.5–4.5)
Glucose: 150 mg/dL — AB (ref 70–99)
Potassium: 4.1 mmol/L (ref 3.5–5.2)
Sodium: 142 mmol/L (ref 134–144)
Total Protein: 6.8 g/dL (ref 6.0–8.5)
eGFR: 82 mL/min/1.73 (ref >59)

## 2024-07-11 LAB — MAGNESIUM: Magnesium: 2.2 mg/dL (ref 1.6–2.3)

## 2024-07-11 MED ORDER — METOPROLOL TARTRATE 100 MG PO TABS
100.0000 mg | ORAL_TABLET | Freq: Once | ORAL | 0 refills | Status: DC
  Filled 2024-07-11 – 2024-07-15 (×3): qty 1, 1d supply, fill #0

## 2024-07-11 NOTE — Progress Notes (Signed)
   07/11/2024  Patient ID: Pamela Roberson, female   DOB: January 15, 1956, 68 y.o.   MRN: [id number]  Patient called to inquire about her lab work that was completed by cardiology. HIPAA identifiers were obtained.   Patient was concerned because her blood sugar was 150 mg/dl on her lab report and it was listed as high.  Patient reported she had a cup of coffee prior to going for her lab work and that she had put stevia and creamer in her coffee.  It was explained that stevia and creamer could elevate her blood sugar and that a reading of 150 would be normal.    We reviewed fasting and postprandial glucose goals.  Plan: Follow up with the Patient after her Endocrinology visit with Dr. Dartha 07/18/2024  Cassius DOROTHA Brought, PharmD, [facility] Clinical Pharmacist [phone]

## 2024-07-12 ENCOUNTER — Other Ambulatory Visit (HOSPITAL_COMMUNITY): Payer: Self-pay

## 2024-07-12 ENCOUNTER — Ambulatory Visit: Admitting: Cardiology

## 2024-07-13 NOTE — ED Triage Notes (Signed)
 Pt present with sore throat x three weeks Pt states she has had throat drainage, ear pain and soreness. States she has gargled salt and warm water. Reports dry cough.

## 2024-07-14 ENCOUNTER — Encounter: Payer: Self-pay | Admitting: Podiatry

## 2024-07-14 NOTE — Progress Notes (Signed)
 Subjective:  Patient ID: Pamela Roberson, female    DOB: March 13, 1956,  MRN: [id number]  Pamela Roberson presents to clinic today for at risk foot care with history of peripheral neuropathy and callus(es) of both feet and painful thick toenails that are difficult to trim. Painful toenails interfere with ambulation. Aggravating factors include wearing enclosed shoe gear. Pain is relieved with periodic professional debridement. Painful calluses are aggravated when weightbearing with and without shoegear. Pain is relieved with periodic professional debridement.  Chief Complaint  Patient presents with   Nail Problem    Thick painful toenails, 9 week  follow up    New problem(s): None.   PCP is Georgina Speaks, FNP. ARNETTA 02/13/2024.  Allergies  Allergen Reactions   Crab [Shellfish Allergy] Shortness Of Breath and Swelling   Erythromycin Swelling and Rash   Hm Lidocaine  Patch [Lidocaine ]     Burning on skin   Fish Allergy     Swelling    Metoclopramide Other (See Comments)    Slurred speech and unable to move muscles,  Caused her to drag her feet   Sulfacetamide     Other reaction(s): itching, rash   Sulfacetamide Sodium-Sulfur Other (See Comments)    Other reaction(s): itching, rash   Doxycycline      GI Intolerance   Levaquin [Levofloxacin In D5w] Other (See Comments)    Causes irregular heart beat   Morphine Nausea And Vomiting and Rash    Irregular heart beat   Sulfa Antibiotics Rash    Review of Systems: Negative except as noted in the HPI.  Objective: No changes noted in today's physical examination. There were no vitals filed for this visit. Pamela Roberson is a pleasant 68 y.o. female in NAD. AAO x 3.  Vascular Examination: Capillary refill time immediate b/l.DP pulses 2/4 b/l. PT pulses 1/4 b/l. Pedal hair absent b/l. No edema. No pain with calf compression b/l. Skin temperature gradient WNL b/l. No cyanosis or clubbing noted b/l. No ischemia or gangrene b/l.    Neurological Examination:  Pt has subjective symptoms of neuropathy. Protective sensation decreased with 10 gram monofilament b/l.  Dermatological Examination: Pedal skin with normal turgor, texture and tone b/l.  No open wounds. No interdigital macerations.   Toenails 1-5 b/l thick, discolored, elongated with subungual debris and pain on dorsal palpation.   Hyperkeratotic lesion(s) submet head 1 left foot, submet head 5 left foot, and submet head 5 right foot.  No erythema, no edema, no drainage, no fluctuance.  Musculoskeletal Examination: Muscle strength 5/5 to all lower extremity muscle groups bilaterally. 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  Radiographs: None  Last A1c:      Latest Ref Rng & Units 04/16/2024    9:48 AM 01/15/2024   12:37 PM 08/21/2023   11:56 AM  Hemoglobin A1C  Hemoglobin-A1c 4.0 - 5.6 % 8.7  9.1  7.2     Assessment/Plan: 1. Pain due to onychomycosis of toenails of both feet   2. Callus   3. Diabetic peripheral neuropathy associated with type 2 diabetes mellitus (HCC)     Patient was evaluated and treated. All patient's and/or POA's questions/concerns addressed on today's visit. Mycotic toenails 1-5 debrided in length and girth without incident. Callus(es) submet head 1 left foot, submet head 5 b/l, and sub 5th met base right foot pared with sharp debridement without incident. Continue daily foot inspections and monitor blood glucose per PCP/Endocrinologist's recommendations. Continue soft, supportive shoe gear daily.  Report any pedal injuries to medical professional. Call office if there are any questions/concerns.  Return in about 3 months (around 10/09/2024).  Delon LITTIE Merlin, DPM      [facility] LOCATION: [address] [place] [address]                   Office [phone]   [facility] LOCATION: [address] [place], [place]  [address] Office [phone]

## 2024-07-15 ENCOUNTER — Other Ambulatory Visit (HOSPITAL_COMMUNITY): Payer: Self-pay

## 2024-07-15 DIAGNOSIS — M170 Bilateral primary osteoarthritis of knee: Secondary | ICD-10-CM | POA: Diagnosis not present

## 2024-07-18 ENCOUNTER — Encounter: Payer: Self-pay | Admitting: "Endocrinology

## 2024-07-18 ENCOUNTER — Ambulatory Visit (INDEPENDENT_AMBULATORY_CARE_PROVIDER_SITE_OTHER): Admitting: "Endocrinology

## 2024-07-18 ENCOUNTER — Other Ambulatory Visit (HOSPITAL_COMMUNITY): Payer: Self-pay

## 2024-07-18 ENCOUNTER — Other Ambulatory Visit: Payer: Self-pay | Admitting: "Endocrinology

## 2024-07-18 VITALS — BP 140/80 | HR 85 | Ht 66.0 in | Wt 199.0 lb

## 2024-07-18 DIAGNOSIS — E1165 Type 2 diabetes mellitus with hyperglycemia: Secondary | ICD-10-CM | POA: Diagnosis not present

## 2024-07-18 DIAGNOSIS — Z794 Long term (current) use of insulin: Secondary | ICD-10-CM | POA: Diagnosis not present

## 2024-07-18 DIAGNOSIS — Z7984 Long term (current) use of oral hypoglycemic drugs: Secondary | ICD-10-CM

## 2024-07-18 DIAGNOSIS — E7800 Pure hypercholesterolemia, unspecified: Secondary | ICD-10-CM

## 2024-07-18 DIAGNOSIS — G629 Polyneuropathy, unspecified: Secondary | ICD-10-CM | POA: Diagnosis not present

## 2024-07-18 LAB — POCT GLYCOSYLATED HEMOGLOBIN (HGB A1C): Hemoglobin A1C: 7.5 % — AB (ref 4.0–5.6)

## 2024-07-18 MED ORDER — DEXCOM G7 SENSOR MISC
1.0000 | 0 refills | Status: DC
  Filled 2024-07-18: qty 9, 90d supply, fill #0

## 2024-07-18 MED ORDER — GLIPIZIDE 5 MG PO TABS
5.0000 mg | ORAL_TABLET | Freq: Every day | ORAL | 1 refills | Status: DC
  Filled 2024-07-18: qty 90, 90d supply, fill #0
  Filled 2024-10-09: qty 90, 90d supply, fill #1

## 2024-07-18 NOTE — Progress Notes (Signed)
 Outpatient Endocrinology Note Obadiah Birmingham, MD  07/18/24   Pamela Roberson [birth year] [id number]  Referring Provider: Georgina Speaks, FNP Primary Care Provider: Georgina Speaks, FNP Reason for consultation: Subjective   Assessment & Plan  Diagnoses and all orders for this visit:  Uncontrolled type 2 diabetes mellitus with hyperglycemia (HCC) -     POCT glycosylated hemoglobin (Hb A1C)  Neuropathy  Long term (current) use of oral hypoglycemic drugs  Long-term insulin  use (HCC)  Pure hypercholesterolemia  Other orders -     glipiZIDE  (GLUCOTROL ) 5 MG tablet; Take 1 tablet (5 mg total) by mouth daily before breakfast. Hold if blood sugar is less than 100   Diabetes Type II complicated by neuropathy,  Lab Results  Component Value Date   GFR 89.00 09/01/2016   Hba1c goal less than 7, current Hba1c is  Lab Results  Component Value Date   HGBA1C 8.7 (A) 04/16/2024   Will recommend the following: Lantus  16 units every day  Metformin  XR 500mg , one pill with break fast and 1 pill with supper: max tolerated dose  Start Glipizide  5 gm qam  Took mounjaro  2.5mg /week-stopped it due to constipation Took ozempic -stopped due to back rash  Regular metformin  caused GI S/E Reports groin rash improved now Couldn't tolerate jardiance    Previously,  Ordered DexCom G7 Ordered DM education Ordered meter with supplies  No known contraindications/side effects to any of above medications Glucagon discussed and prescribed with refills on 07/18/24  -Last LD and Tg are as follows: Lab Results  Component Value Date   LDLCALC 60 01/15/2024    Lab Results  Component Value Date   TRIG 158 (H) 01/15/2024   -On pravastatin  20 mg QD -Follow low fat diet and exercise   -Blood pressure goal <140/90 - Microalbumin/creatinine goal is < 30 -Last MA/Cr is as follows: Lab Results  Component Value Date   MICROALBUR 0.7 01/15/2024   -not on ACE/ARB -diet changes including  salt restriction -limit eating outside -counseled BP targets per standards of diabetes care -uncontrolled blood pressure can lead to retinopathy, nephropathy and cardiovascular and atherosclerotic heart disease  Reviewed and counseled on: -A1C target -Blood sugar targets -Complications of uncontrolled diabetes  -Checking blood sugar before meals and bedtime and bring log next visit -All medications with mechanism of action and side effects -Hypoglycemia management: rule of 15's, Glucagon Emergency Kit and medical alert ID -low-carb low-fat plate-method diet -At least 20 minutes of physical activity per day -Annual dilated retinal eye exam and foot exam -compliance and follow up needs -follow up as scheduled or earlier if problem gets worse  Call if blood sugar is less than 70 or consistently above 250    Take a 15 gm snack of carbohydrate at bedtime before you go to sleep if your blood sugar is less than 100.    If you are going to fast after midnight for a test or procedure, ask your physician for instructions on how to reduce/decrease your insulin  dose.    Call if blood sugar is less than 70 or consistently above 250  -Treating a low sugar by rule of 15  (15 gms of sugar every 15 min until sugar is more than 70) If you feel your sugar is low, test your sugar to be sure If your sugar is low (less than 70), then take 15 grams of a fast acting Carbohydrate (3-4 glucose tablets or glucose gel or 4 ounces of juice or regular soda) Recheck your sugar 15  min after treating low to make sure it is more than 70 If sugar is still less than 70, treat again with 15 grams of carbohydrate          Don't drive the hour of hypoglycemia  If unconscious/unable to eat or drink by mouth, use glucagon injection or nasal spray baqsimi and call 911. Can repeat again in 15 min if still unconscious.  Return in about 6 weeks (around 08/29/2024).   I have reviewed current medications, nurse's notes,  allergies, vital signs, past medical and surgical history, family medical history, and social history for this encounter. Counseled patient on symptoms, examination findings, lab findings, imaging results, treatment decisions and monitoring and prognosis. The patient understood the recommendations and agrees with the treatment plan. All questions regarding treatment plan were fully answered.  Obadiah Birmingham, MD  07/18/24    History of Present Illness Pamela Roberson is a 68 y.o. year old female who presents for follow up of Type II diabetes mellitus.  Pamela Roberson was first diagnosed in 2005.   Diabetes education +  Home diabetes regimen: Lantus  14 units every day  Metformin  XR 500mg , one pill bid   Took mounjaro  2.5mg /week-stopped it due to constipation Took ozempic -stopped due to back rash  Metformin  caused GI S/E Reports groin rash right now  COMPLICATIONS -  MI/Stroke -  retinopathy +  neuropathy -  nephropathy  BLOOD SUGAR DATA 164-240 range CGM interpretation: At today's visit, we reviewed her CGM downloads. The full report is scanned in the media. Reviewing the CGM trends, BG are elevated in daytime and improve in the morning.   Physical Exam  BP (!) 140/80   Pulse 85   Ht 5' 6 (1.676 m)   Wt 199 lb (90.3 kg)   SpO2 98%   BMI 32.12 kg/m    Constitutional: well developed, well nourished Head: normocephalic, atraumatic Eyes: sclera anicteric, no redness Neck: supple Lungs: normal respiratory effort Neurology: alert and oriented Skin: dry, no appreciable rashes Musculoskeletal: no appreciable defects Psychiatric: normal mood and affect Diabetic Foot Exam - Simple   No data filed      Current Medications Patient's Medications  New Prescriptions   GLIPIZIDE  (GLUCOTROL ) 5 MG TABLET    Take 1 tablet (5 mg total) by mouth daily before breakfast. Hold if blood sugar is less than 100  Previous Medications   ACCU-CHEK SOFTCLIX LANCETS LANCETS     Use to check blood sugar 3 (three) times daily (morning, noon, and bedtime)   ACYCLOVIR  (ZOVIRAX ) 400 MG TABLET    Take 1 tablet (400 mg total) by mouth 2 (two) times daily.   ALBUTEROL  (PROVENTIL ) (2.5 MG/3ML) 0.083% NEBULIZER SOLUTION    Take 3 mLs (2.5 mg total) by nebulization every 4 (four) hours as needed for wheezing or shortness of breath.   ALBUTEROL  (VENTOLIN  HFA) 108 (90 BASE) MCG/ACT INHALER    Inhale 2 puffs into the lungs every 4 (four) hours as needed for wheezing or shortness of breath.   AMLODIPINE  (NORVASC ) 5 MG TABLET    Take 1 tablet (5 mg total) by mouth daily.   ARTIFICIAL TEAR OINTMENT (DRY EYES OP)    Place 1-2 drops into both eyes daily as needed (for dry eyes).    ASCORBIC ACID (VITAMIN C) 1000 MG TABLET    Take 1 tablet by mouth daily.   AZELASTINE  (OPTIVAR ) 0.05 % OPHTHALMIC SOLUTION    Place 1 drop into both eyes 2 (two) times daily.  BACLOFEN  5 MG TABS    Take 1 tablet (5 mg total) by mouth 2 (two) times daily as needed.   BLOOD GLUCOSE CALIBRATION (TRUE METRIX LEVEL 1) LOW SOLN       BLOOD GLUCOSE MONITORING SUPPL (TRUE METRIX METER) W/DEVICE KIT       BLOOD GLUCOSE MONITORING SUPPL DEVI    1 each by Does not apply route in the morning, at noon, and at bedtime. May substitute to any manufacturer covered by patient's insurance.   BUDESONIDE -FORMOTEROL  (SYMBICORT ) 160-4.5 MCG/ACT INHALER    Inhale 2 puffs into the lungs 2 (two) times daily with a spacer.   CARBINOXAMINE  MALEATE 4 MG TABS    Take 2 tablets (8 mg total) by mouth 2 (two) times daily.   CARBINOXAMINE  MALEATE 4 MG TABS    Take 2 tablets (8 mg total) by mouth 2 (two) times daily.   CHOLECALCIFEROL (VITAMIN D3) 25 MCG (1000 UNIT) TABLET    Take 2,000 Units by mouth daily.   CLOTRIMAZOLE  (LOTRIMIN ) 1 % CREAM    Apply 1 Application topically to rash 2 (two) times daily.   CLOTRIMAZOLE  1 % LOTN    Apply to rash twice daily for 1-2 weeks   CONTINUOUS GLUCOSE RECEIVER (DEXCOM G7 RECEIVER) DEVI    1 Device by Does  not apply route continuous.   CONTINUOUS GLUCOSE SENSOR (DEXCOM G7 SENSOR) MISC    1 Device by Does not apply route continuous.   CYCLOSPORINE (RESTASIS) 0.05 % OPHTHALMIC EMULSION    Place 1 drop into both eyes 2 times daily.   EPINEPHRINE  0.3 MG/0.3 ML IJ SOAJ INJECTION    Inject 0.3 mg into the muscle as needed for anaphylaxis.   FAMOTIDINE  (PEPCID ) 20 MG TABLET    Take 1 tablet (20 mg total) by mouth 1 to 2 times daily.   FLUTICASONE  (FLONASE ) 50 MCG/ACT NASAL SPRAY    Place 2 sprays into both nostrils daily.   FLUTICASONE  (FLONASE ) 50 MCG/ACT NASAL SPRAY    Place 2 sprays into both nostrils daily.   GLUCOSE BLOOD (BLOOD GLUCOSE TEST STRIPS) STRP    1 each by In Vitro route in the morning, at noon, and at bedtime. May substitute to any manufacturer covered by patient's insurance.   INSULIN  GLARGINE (LANTUS ) 100 UNIT/ML SOLOSTAR PEN    Inject 10 Units into the skin daily.   INSULIN  GLARGINE SOLOSTAR (LANTUS ) 100 UNIT/ML SOLOSTAR PEN    Inject 10 Units into the skin daily.   INSULIN  PEN NEEDLE (PEN NEEDLES) 32G X 4 MM MISC    1 Device by Does not apply route daily.   INSULIN  PEN NEEDLE 32G X 4 MM MISC    Use 1 pen needle daily.   LANCETS MISC. (ACCU-CHEK SOFTCLIX LANCET DEV) KIT    Use to check blood sugar in the morning, at noon, and at bedtime.   LIDOCAINE  (XYLOCAINE ) 2 % SOLUTION    Rinse and gargle with 90ml's as directed in the mouth or throat every 4 hours as needed.   METFORMIN  (GLUCOPHAGE -XR) 500 MG 24 HR TABLET    Take 1 tablet (500 mg total) by mouth 2 (two) times daily with a meal.   METOPROLOL  SUCCINATE (TOPROL -XL) 25 MG 24 HR TABLET    Take 1 tablet (25 mg total) by mouth daily.   METOPROLOL  TARTRATE (LOPRESSOR ) 100 MG TABLET    Take 1 tablet (100 mg total) by mouth once for 1 dose. Take 90-120 minutes prior to scan. Hold for SBP less than  110.   MONTELUKAST  (SINGULAIR ) 10 MG TABLET    Take 1 tablet (10 mg total) by mouth daily.   MONTELUKAST  (SINGULAIR ) 10 MG TABLET    Take 1 tablet  (10 mg total) by mouth daily.   MULTIPLE VITAMIN (MULTIVITAMIN WITH MINERALS) TABS TABLET    Take 1 tablet by mouth daily.   NITROGLYCERIN  (NITROSTAT ) 0.4 MG SL TABLET    Place 1 tablet (0.4 mg total) under the tongue every 5 (five) minutes as needed for chest pain as directed.   ONDANSETRON  (ZOFRAN -ODT) 4 MG DISINTEGRATING TABLET    Take 1 tablet (4 mg total) by mouth every 8 (eight) hours as needed for nausea or vomiting.   PANTOPRAZOLE  (PROTONIX ) 40 MG TABLET    Take 1 tablet (40 mg total) by mouth daily.   PRAVASTATIN  (PRAVACHOL ) 20 MG TABLET    Take 1 tablet (20 mg total) by mouth every evening.   TRUE METRIX BLOOD GLUCOSE TEST TEST STRIP    1 each by Other route 3 (three) times daily.   TRUEPLUS LANCETS 33G MISC    Apply 1 each topically 3 (three) times daily.  Modified Medications   No medications on file  Discontinued Medications   No medications on file    Allergies Allergies  Allergen Reactions   Crab [Shellfish Allergy] Shortness Of Breath and Swelling   Erythromycin Swelling and Rash   Hm Lidocaine  Patch [Lidocaine ]     Burning on skin   Fish Allergy     Swelling    Metoclopramide Other (See Comments)    Slurred speech and unable to move muscles,  Caused her to drag her feet   Sulfacetamide     Other reaction(s): itching, rash   Sulfacetamide Sodium-Sulfur Other (See Comments)    Other reaction(s): itching, rash   Doxycycline      GI Intolerance   Levaquin [Levofloxacin In D5w] Other (See Comments)    Causes irregular heart beat   Morphine Nausea And Vomiting and Rash    Irregular heart beat   Sulfa Antibiotics Rash    Past Medical History Past Medical History:  Diagnosis Date   Arthritis    Asthma    Carotid stenosis, bilateral    Carpal tunnel syndrome    Chronic low back pain    COPD (chronic obstructive pulmonary disease) (HCC)    Diabetes mellitus without complication (HCC)    type II    Diabetic neuropathy (HCC)    Diverticulitis    Dyspnea    mild     Frequent falls    GERD (gastroesophageal reflux disease)    History of bronchitis    Hypercholesterolemia    Hypertension    IBS (irritable bowel syndrome)    Osteoarthritis    knee   Palpitations    Right ventricular enlargement    per pt report   Sleep apnea    mild but no sleep apnea    TMJ (dislocation of temporomandibular joint)    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    Vitamin D deficiency     Past Surgical History Past Surgical History:  Procedure Laterality Date   ABDOMINAL HYSTERECTOMY  1993   CARPAL TUNNEL RELEASE Right    CERVICAL DISC SURGERY  04/29/2015   [place]   ESOPHAGEAL MANOMETRY N/A 03/03/2021   Procedure: ESOPHAGEAL MANOMETRY (EM);  Surgeon: Dianna Specking, MD;  Location: [facility];  Service: Endoscopy;  Laterality: N/A;   ESOPHAGOGASTRODUODENOSCOPY ENDOSCOPY  with esophagus being stretched twice per pt,    EYE SURGERY     fatty 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      feeding tube placement and removal   2015   NASAL SEPTUM SURGERY     ROTATOR CUFF REPAIR Right    spurs removed from esophagus   2015   TONSILLECTOMY      Family History family history includes Allergic rhinitis in her father, mother, and sister; Collagen disease in her sister; Diabetes in her father; Heart disease in her brother and father; Hypertension in her brother and father.  Social History Social History   Socioeconomic History   Marital status: Widowed    Spouse name: Not on file   Number of children: 3   Years of education: Not on file   Highest education level: 11th grade  Occupational History    Comment: NA  Tobacco Use   Smoking status: Former    Current packs/day: 0.00    Average packs/day: 0.5 packs/day for 30.0 years (15.0 ttl pk-yrs)    Types: Cigarettes    Start date: 12/08/1964    Quit date: 12/08/1994    Years since quitting: 29.6   Smokeless tobacco: Never  Vaping Use   Vaping status: Never Used  Substance and Sexual Activity    Alcohol use: No   Drug use: No   Sexual activity: Not Currently  Other Topics Concern   Not on file  Social History Narrative   Lives with daughter   Caffeine- coffee 12 oz   Social Drivers of Corporate investment banker Strain: Low Risk  (02/09/2024)   Overall Financial Resource Strain (CARDIA)    Difficulty of Paying Living Expenses: Not very hard  Food Insecurity: Food Insecurity Present (02/09/2024)   Hunger Vital Sign    Worried About Running Out of Food in the Last Year: Never true    Ran Out of Food in the Last Year: Sometimes true  Transportation Needs: No Transportation Needs (02/09/2024)   PRAPARE - Administrator, Civil Service (Medical): No    Lack of Transportation (Non-Medical): No  Physical Activity: Sufficiently Active (02/09/2024)   Exercise Vital Sign    Days of Exercise per Week: 4 days    Minutes of Exercise per Session: 40 min  Stress: No Stress Concern Present (02/09/2024)   [employer] of Occupational Health - Occupational Stress Questionnaire    Feeling of Stress : Not at all  Social Connections: Moderately Integrated (02/09/2024)   Social Connection and Isolation Panel    Frequency of Communication with Friends and Family: Twice a week    Frequency of Social Gatherings with Friends and Family: Twice a week    Attends Religious Services: More than 4 times per year    Active Member of [employer] or Organizations: Yes    Attends Banker Meetings: More than 4 times per year    Marital Status: Widowed  Intimate Partner Violence: Not At Risk (05/25/2023)   Humiliation, Afraid, Rape, and Kick questionnaire    Fear of Current or Ex-Partner: No    Emotionally Abused: No    Physically Abused: No    Sexually Abused: No    Lab Results  Component Value Date   HGBA1C 8.7 (A) 04/16/2024   HGBA1C 9.1 (H) 01/15/2024   HGBA1C 7.2 (H) 08/21/2023   Lab Results  Component Value Date   CHOL 138 01/15/2024   Lab Results  Component Value Date    HDL  54 01/15/2024   Lab Results  Component Value Date   LDLCALC 60 01/15/2024   Lab Results  Component Value Date   TRIG 158 (H) 01/15/2024   Lab Results  Component Value Date   CHOLHDL 2.6 01/15/2024   Lab Results  Component Value Date   CREATININE 0.79 07/10/2024   Lab Results  Component Value Date   GFR 89.00 09/01/2016   Lab Results  Component Value Date   MICROALBUR 0.7 01/15/2024      Component Value Date/Time   NA 142 07/10/2024 0947   K 4.1 07/10/2024 0947   CL 103 07/10/2024 0947   CO2 19 (L) 07/10/2024 0947   GLUCOSE 150 (H) 07/10/2024 0947   GLUCOSE 175 (H) 03/02/2024 0943   BUN 9 07/10/2024 0947   CREATININE 0.79 07/10/2024 0947   CREATININE 0.98 01/15/2024 1237   CALCIUM  10.1 07/10/2024 0947   PROT 6.8 07/10/2024 0947   ALBUMIN 4.3 07/10/2024 0947   AST 16 07/10/2024 0947   ALT 12 07/10/2024 0947   ALKPHOS 109 07/10/2024 0947   BILITOT 0.5 07/10/2024 0947   GFRNONAA >60 03/02/2024 0943   GFRAA >60 07/08/2020 2000      Latest Ref Rng & Units 07/10/2024    9:47 AM 03/02/2024    9:43 AM 01/15/2024   12:37 PM  BMP  Glucose 70 - 99 mg/dL 849  824  708   BUN 8 - 27 mg/dL 9  7  10    Creatinine 0.57 - 1.00 mg/dL 9.20  9.23  9.01   BUN/Creat Ratio 12 - 28 11   SEE NOTE:   Sodium 134 - 144 mmol/L 142  141  139   Potassium 3.5 - 5.2 mmol/L 4.1  3.9  4.1   Chloride 96 - 106 mmol/L 103  110  103   CO2 20 - 29 mmol/L 19  23  27    Calcium  8.7 - 10.3 mg/dL 89.8  9.4  9.3        Component Value Date/Time   WBC 4.8 03/02/2024 0943   RBC 5.56 (H) 03/02/2024 0943   HGB 16.1 (H) 03/02/2024 0943   HGB 14.6 08/21/2023 1156   HCT 49.6 (H) 03/02/2024 0943   HCT 46.4 08/21/2023 1156   PLT 297 03/02/2024 0943   PLT 290 08/21/2023 1156   MCV 89.2 03/02/2024 0943   MCV 90 08/21/2023 1156   MCH 29.0 03/02/2024 0943   MCHC 32.5 03/02/2024 0943   RDW 13.4 03/02/2024 0943   RDW 13.2 08/21/2023 1156   LYMPHSABS 1.6 03/02/2024 0943   LYMPHSABS 1.7 08/21/2023 1156    MONOABS 0.5 03/02/2024 0943   EOSABS 0.0 03/02/2024 0943   EOSABS 0.1 08/21/2023 1156   BASOSABS 0.0 03/02/2024 0943   BASOSABS 0.0 08/21/2023 1156     Parts of this note may have been dictated using voice recognition software. There may be variances in spelling and vocabulary which are unintentional. Not all errors are proofread. Please notify the dino if any discrepancies are noted or if the meaning of any statement is not clear.

## 2024-07-18 NOTE — Telephone Encounter (Signed)
 Requested Prescriptions   Pending Prescriptions Disp Refills   Continuous Glucose Sensor (DEXCOM G7 SENSOR) MISC 9 each 0    Sig: 1 Device by Does not apply route continuous.

## 2024-07-18 NOTE — Patient Instructions (Signed)

## 2024-07-19 ENCOUNTER — Encounter: Payer: Self-pay | Admitting: "Endocrinology

## 2024-07-23 ENCOUNTER — Other Ambulatory Visit (HOSPITAL_COMMUNITY): Payer: Self-pay

## 2024-07-23 ENCOUNTER — Encounter (HOSPITAL_COMMUNITY): Payer: Self-pay

## 2024-07-25 ENCOUNTER — Telehealth: Payer: Self-pay | Admitting: Pharmacist

## 2024-07-25 DIAGNOSIS — E1140 Type 2 diabetes mellitus with diabetic neuropathy, unspecified: Secondary | ICD-10-CM

## 2024-07-25 NOTE — Progress Notes (Signed)
 07/25/2024  Patient ID: Pamela Roberson, female   DOB: 1956/03/11, 68 y.o.   MRN: [id number]  Patient was called to follow up with her after her Endocrinology appointment with Dr. Dartha.  HIPAA identifiers were obtained.    Ms. Aarionna Germer is a 68 year old female with multiple medical conditions including but not limited to:  type 2 diabetes, hypertension, GERD, hyperlipidemia, and post nasal drip.  She uses Dexcom G7 to monitor her blood sugars and Accu Check Guide.  Unfortunately, the Patient does use the Dexcom App she only uses the receiver which has to be manually downloaded.  Her HgA1c from last week was 7.5%.  Patient said Dr. Dartha started her on Glipizide  5 mg daily but she is not taking it because it caused her blood sugar to drop to 66 and that she blacked out.  She also reported that Mr. Motwani increased her Metformin  dose to 500 mg in the morning and 1000 mg in the evening.  Patient reported she is only taking Metformin  500 mg 1 tablet twice daily due to stomach upset.  Her medication list only reflected Metformin  500 mg 1 tablet twice daily.   Patient's medications were reconciled via telephone:  Medications Reviewed Today     Reviewed by Jolee Cassius PARAS, [facility] (Pharmacist) on 07/25/24 at 1314  Med List Status: <None>   Medication Order Taking? Sig Documenting Provider Last Dose Status Informant  Accu-Chek Softclix Lancets lancets [id number] Yes Use to check blood sugar 3 (three) times daily (morning, noon, and bedtime) Motwani, Komal, MD  Active   acyclovir  (ZOVIRAX ) 400 MG tablet [id number]  Take 1 tablet (400 mg total) by mouth 2 (two) times daily. Georgina Speaks, FNP  Active   albuterol  (PROVENTIL ) (2.5 MG/3ML) 0.083% nebulizer solution [id number] Yes Take 3 mLs (2.5 mg total) by nebulization every 4 (four) hours as needed for wheezing or shortness of breath. Jeneal Danita Macintosh, MD  Active   albuterol  (VENTOLIN  HFA) 108 [phone]) MCG/ACT inhaler  [id number] Yes Inhale 2 puffs into the lungs every 4 (four) hours as needed for wheezing or shortness of breath. Jeneal Danita Macintosh, MD  Active   amLODipine  (NORVASC ) 5 MG tablet [id number] Yes Take 1 tablet (5 mg total) by mouth daily. Tobb, Kardie, DO  Active   Artificial Tear Ointment (DRY EYES OP) [id number] Yes Place 1-2 drops into both eyes daily as needed (for dry eyes).  [provider]  Active Self  ascorbic acid (VITAMIN C) 1000 MG tablet [id number]  Take 1 tablet by mouth daily. [provider]  Active   azelastine  (OPTIVAR ) 0.05 % ophthalmic solution [id number] Yes Place 1 drop into both eyes 2 (two) times daily. Jeneal Danita Macintosh, MD  Active   Baclofen  5 MG TABS [id number] Yes Take 1 tablet (5 mg total) by mouth 2 (two) times daily as needed. Rising, Asberry RIGGERS  Active      Discontinued 07/25/24 1313 (Change in therapy)      Discontinued 07/25/24 1313 (Duplicate)   Blood Glucose Monitoring Suppl DEVI [id number] Yes 1 each by Does not apply route in the morning, at noon, and at bedtime. May substitute to any manufacturer covered by patient's insurance. Motwani, Komal, MD  Active   budesonide -formoterol  (SYMBICORT ) 160-4.5 MCG/ACT inhaler [id number] Yes Inhale 2 puffs into the lungs 2 (two) times daily with a spacer. Jeneal Danita Macintosh, MD  Active   Carbinoxamine  Maleate 4 MG TABS [id number] Yes Take 2 tablets (8 mg total) by mouth 2 (  two) times daily. Jeneal Danita Macintosh, MD  Active   Carbinoxamine  Maleate 4 MG TABS [id number]  Take 2 tablets (8 mg total) by mouth 2 (two) times daily. Jeneal Danita Macintosh, MD  Consider Medication Status and Discontinue (Duplicate)   cholecalciferol (VITAMIN D3) 25 MCG (1000 UNIT) tablet [id number] Yes Take 2,000 Units by mouth daily. [provider]  Active Self  clotrimazole  (LOTRIMIN ) 1 % cream [id number] Yes Apply 1 Application topically to rash 2 (two) times daily. Rising, Asberry RIGGERS  Active     Discontinued 07/25/24 1312 (Duplicate)   Continuous Glucose Receiver (DEXCOM G7 RECEIVER) DEVI [id number] Yes 1 Device by Does not apply route continuous. Motwani, Komal, MD  Active   Continuous Glucose Sensor (DEXCOM G7 SENSOR) MISC [id number] Yes Use to monitor blood sugar levels continuously, changing sensor every 10 days. Motwani, Komal, MD  Active   cycloSPORINE (RESTASIS) 0.05 % ophthalmic emulsion [id number] Yes Place 1 drop into both eyes 2 times daily. [provider]  Active   EPINEPHrine  0.3 mg/0.3 mL IJ SOAJ injection [id number] Yes Inject 0.3 mg into the muscle as needed for anaphylaxis. Jeneal Danita Macintosh, MD  Active    Patient not taking:   Discontinued 08/06/20 2039 famotidine  (PEPCID ) 20 MG tablet [id number] Yes Take 1 tablet (20 mg total) by mouth 1 to 2 times daily. Jeneal Danita Macintosh, MD  Active   fluticasone  (FLONASE ) 50 MCG/ACT nasal spray [id number] Yes Place 2 sprays into both nostrils daily.   Active     Discontinued 07/25/24 1311 (Duplicate)   glipiZIDE  (GLUCOTROL ) 5 MG tablet [id number]  Take 1 tablet (5 mg total) by mouth daily before breakfast. Hold if blood sugar is less than 100  Patient not taking: Reported on 07/25/2024   Motwani, Komal, MD  Active   Glucose Blood (BLOOD GLUCOSE TEST STRIPS) STRP [id number] Yes 1 each by In Vitro route in the morning, at noon, and at bedtime. May substitute to any manufacturer covered by patient's insurance. Motwani, Komal, MD  Active   insulin  glargine (LANTUS ) 100 UNIT/ML Solostar Pen [id number] Yes Inject 10 Units into the skin daily.  Patient taking differently: Inject 14 Units into the skin daily.   Motwani, Komal, MD  Active     Discontinued 07/25/24 1306 (Duplicate)   Insulin  Pen Needle (PEN NEEDLES) 32G X 4 MM MISC [id number] Yes 1 Device by Does not apply route daily. Dartha Ernst, MD  Active      Discontinued 07/25/24 1307 (Duplicate)   Lancets Misc. (ACCU-CHEK SOFTCLIX LANCET DEV) KIT [id number] Yes Use to  check blood sugar in the morning, at noon, and at bedtime. Motwani, Komal, MD  Active   lidocaine  (XYLOCAINE ) 2 % solution [id number] Yes Rinse and gargle with 35ml's as directed in the mouth or throat every 4 hours as needed. White, Elizabeth A, PA-C  Active   metFORMIN  (GLUCOPHAGE -XR) 500 MG 24 hr tablet [id number] Yes Take 1 tablet (500 mg total) by mouth 2 (two) times daily with a meal. Motwani, Komal, MD  Active   metoprolol  succinate (TOPROL -XL) 25 MG 24 hr tablet [id number] Yes Take 1 tablet (25 mg total) by mouth daily. Tobb, Kardie, DO  Active     Discontinued 07/25/24 1310 (Dose change)     Discontinued 07/25/24 1310 (Duplicate)   montelukast  (SINGULAIR ) 10 MG tablet [id number] Yes Take 1 tablet (10 mg total) by mouth daily. Jeneal Danita Macintosh, MD  Active   Multiple Vitamin (MULTIVITAMIN WITH MINERALS) TABS tablet [id number] Yes Take 1 tablet by  mouth daily. [provider]  Active Self  nitroGLYCERIN  (NITROSTAT ) 0.4 MG SL tablet [id number] Yes Place 1 tablet (0.4 mg total) under the tongue every 5 (five) minutes as needed for chest pain as directed. Tobb, Kardie, DO  Active   ondansetron  (ZOFRAN -ODT) 4 MG disintegrating tablet [id number] Yes Take 1 tablet (4 mg total) by mouth every 8 (eight) hours as needed for nausea or vomiting. Teresa Almarie LABOR, PA-C  Active   pantoprazole  (PROTONIX ) 40 MG tablet [id number] Yes Take 1 tablet (40 mg total) by mouth daily. Jeneal Danita Macintosh, MD  Active   pravastatin  (PRAVACHOL ) 20 MG tablet [id number] Yes Take 1 tablet (20 mg total) by mouth every evening. Tobb, Kardie, DO  Active     Discontinued 07/25/24 1314 (Change in therapy)     Discontinued 07/25/24 1314 (Change in therapy)            SUPD gap closed. Pravastatin  20 mg filled 07/24/24 for a 90 day supply  Lipid Panel     Component Value Date/Time   CHOL 138 01/15/2024 1237   CHOL 114 08/21/2023 1156   TRIG 158 (H) 01/15/2024 1237   HDL 54 01/15/2024 1237   HDL 56  08/21/2023 1156   CHOLHDL 2.6 01/15/2024 1237   LDLCALC 60 01/15/2024 1237   LABVLDL 22 08/21/2023 1156    Patient has an upcoming appointment with Gaines Ada, FNP on 08/22/2024.  I will follow up with the Patient after her upcoming appointment.  Cassius DOROTHA Brought, PharmD, BCACP Clinical Pharmacist [phone]

## 2024-07-26 ENCOUNTER — Ambulatory Visit (HOSPITAL_COMMUNITY)
Admission: RE | Admit: 2024-07-26 | Discharge: 2024-07-26 | Disposition: A | Discharge status: Home / Self Care | Source: Ambulatory Visit | Attending: Cardiology | Admitting: Cardiology

## 2024-07-26 DIAGNOSIS — I2510 Atherosclerotic heart disease of native coronary artery without angina pectoris: Secondary | ICD-10-CM | POA: Insufficient documentation

## 2024-07-26 DIAGNOSIS — R079 Chest pain, unspecified: Secondary | ICD-10-CM | POA: Diagnosis not present

## 2024-07-26 DIAGNOSIS — Z794 Long term (current) use of insulin: Secondary | ICD-10-CM | POA: Insufficient documentation

## 2024-07-26 DIAGNOSIS — G4733 Obstructive sleep apnea (adult) (pediatric): Secondary | ICD-10-CM | POA: Insufficient documentation

## 2024-07-26 DIAGNOSIS — I10 Essential (primary) hypertension: Secondary | ICD-10-CM | POA: Insufficient documentation

## 2024-07-26 DIAGNOSIS — I700 Atherosclerosis of aorta: Secondary | ICD-10-CM | POA: Insufficient documentation

## 2024-07-26 DIAGNOSIS — E119 Type 2 diabetes mellitus without complications: Secondary | ICD-10-CM | POA: Diagnosis not present

## 2024-07-26 DIAGNOSIS — R072 Precordial pain: Secondary | ICD-10-CM | POA: Insufficient documentation

## 2024-07-26 DIAGNOSIS — R002 Palpitations: Secondary | ICD-10-CM | POA: Insufficient documentation

## 2024-07-26 DIAGNOSIS — Z01812 Encounter for preprocedural laboratory examination: Secondary | ICD-10-CM

## 2024-07-26 MED ORDER — IOHEXOL 350 MG/ML SOLN
100.0000 mL | Freq: Once | INTRAVENOUS | Status: AC | PRN
  Administered 2024-07-26: 100 mL via INTRAVENOUS

## 2024-07-26 MED ORDER — NITROGLYCERIN 0.4 MG SL SUBL
0.8000 mg | SUBLINGUAL_TABLET | Freq: Once | SUBLINGUAL | Status: AC
  Administered 2024-07-26: 0.8 mg via SUBLINGUAL

## 2024-07-27 ENCOUNTER — Ambulatory Visit (HOSPITAL_COMMUNITY)
Admission: RE | Admit: 2024-07-27 | Discharge: 2024-07-27 | Disposition: A | Discharge status: Home / Self Care | Payer: Self-pay | Source: Ambulatory Visit | Attending: Family Medicine | Admitting: Family Medicine

## 2024-07-27 ENCOUNTER — Encounter (HOSPITAL_COMMUNITY): Payer: Self-pay

## 2024-07-27 ENCOUNTER — Other Ambulatory Visit (HOSPITAL_COMMUNITY): Payer: Self-pay

## 2024-07-27 VITALS — BP 117/75 | HR 98 | Temp 98.6°F | Resp 16

## 2024-07-27 DIAGNOSIS — J0100 Acute maxillary sinusitis, unspecified: Secondary | ICD-10-CM

## 2024-07-27 DIAGNOSIS — R300 Dysuria: Secondary | ICD-10-CM | POA: Diagnosis not present

## 2024-07-27 DIAGNOSIS — J4521 Mild intermittent asthma with (acute) exacerbation: Secondary | ICD-10-CM | POA: Diagnosis not present

## 2024-07-27 LAB — POCT URINALYSIS DIP (MANUAL ENTRY)
Bilirubin, UA: NEGATIVE
Blood, UA: NEGATIVE
Glucose, UA: NEGATIVE mg/dL
Ketones, POC UA: NEGATIVE mg/dL
Leukocytes, UA: NEGATIVE
Nitrite, UA: NEGATIVE
Protein Ur, POC: NEGATIVE mg/dL
Spec Grav, UA: 1.015 (ref 1.010–1.025)
Urobilinogen, UA: 0.2 U/dL
pH, UA: 5.5 (ref 5.0–8.0)

## 2024-07-27 MED ORDER — PREDNISONE 20 MG PO TABS
40.0000 mg | ORAL_TABLET | Freq: Every day | ORAL | 0 refills | Status: DC
  Filled 2024-07-27: qty 10, 5d supply, fill #0

## 2024-07-27 MED ORDER — AMOXICILLIN 875 MG PO TABS
875.0000 mg | ORAL_TABLET | Freq: Two times a day (BID) | ORAL | 0 refills | Status: AC
  Filled 2024-07-27: qty 20, 10d supply, fill #0

## 2024-07-27 NOTE — ED Triage Notes (Signed)
 Patient reports that she has had urinary frequency and dysuria x 4 days.  Patient c/o bilateral ear pain, sinus headache, and sinus issues x 3 weeks.  Patient states she has been taking Flonase , Tylenol , and albuterol  inhaler, and Singulair .

## 2024-07-29 ENCOUNTER — Other Ambulatory Visit (HOSPITAL_COMMUNITY): Payer: Self-pay

## 2024-07-29 ENCOUNTER — Other Ambulatory Visit: Payer: Self-pay

## 2024-07-29 DIAGNOSIS — M170 Bilateral primary osteoarthritis of knee: Secondary | ICD-10-CM | POA: Diagnosis not present

## 2024-07-30 ENCOUNTER — Other Ambulatory Visit (HOSPITAL_COMMUNITY): Payer: Self-pay

## 2024-07-31 NOTE — ED Provider Notes (Signed)
 [facility]   [id number] 07/27/24 Arrival Time: 1020  ASSESSMENT & PLAN:  1. Acute non-recurrent maxillary sinusitis   2. Dysuria   3. Mild intermittent asthma with acute exacerbation    Without resp distress.  Meds ordered this encounter  Medications   amoxicillin  (AMOXIL ) 875 MG tablet    Sig: Take 1 tablet (875 mg total) by mouth 2 (two) times daily for 10 days.    Dispense:  20 tablet    Refill:  0   predniSONE  (DELTASONE ) 20 MG tablet    Sig: Take 2 tablets (40 mg total) by mouth daily.    Dispense:  10 tablet    Refill:  0   U/A normal. Results for orders placed or performed during the hospital encounter of 07/27/24  POC urinalysis dipstick   Collection Time: 07/27/24 11:24 AM  Result Value Ref Range   Color, UA yellow yellow   Clarity, UA clear clear   Glucose, UA negative negative mg/dL   Bilirubin, UA negative negative   Ketones, POC UA negative negative mg/dL   Spec Grav, UA 8.984 8.989 - 1.025   Blood, UA negative negative   pH, UA 5.5 5.0 - 8.0   Protein Ur, POC negative negative mg/dL   Urobilinogen, UA 0.2 0.2 or 1.0 E.U./dL   Nitrite, UA Negative Negative   Leukocytes, UA Negative Negative     Discussed typical duration of symptoms. OTC symptom care as needed. Ensure adequate fluid intake and rest.   Follow-up Information     Schedule an appointment as soon as possible for a visit  with Georgina Speaks, FNP.   Specialty: General Practice Why: For follow up. Contact information: [address] [place] [address] [phone]                 Reviewed expectations re: course of current medical issues. Questions answered. Outlined signs and symptoms indicating need for more acute intervention. Patient verbalized understanding. After Visit Summary given.   SUBJECTIVE: History from: patient.  Pamela Roberson is a 68 y.o. female who reports that she has had urinary frequency and dysuria x 4  days. Patient c/o bilateral ear pain, sinus headache, and sinus issues x 3 weeks. Patient states she has been taking Flonase , Tylenol , and albuterol  inhaler, and Singulair . Not much relief. Is wheezing at times. Denies CP/SOB. Denies fever.   Social History   Tobacco Use  Smoking Status Former   Current packs/day: 0.00   Average packs/day: 0.5 packs/day for 30.0 years (15.0 ttl pk-yrs)   Types: Cigarettes   Start date: 12/08/1964   Quit date: 12/08/1994   Years since quitting: 29.6  Smokeless Tobacco Never    OBJECTIVE:  Vitals:   07/27/24 1037  BP: 117/75  Pulse: 98  Resp: 16  Temp: 98.6 F (37 C)  TempSrc: Oral  SpO2: 95%     General appearance: alert; no distress HEENT: nasal congestion; clear runny nose; throat irritation secondary to post-nasal drainage; bilateral maxillary tenderness to palpation; turbinates boggy Neck: supple without LAD; trachea midline Lungs: unlabored respirations, symmetrical air entry with mild bilat wheezing; cough: mild; no respiratory distress Skin: warm and dry Psychological: alert and cooperative; normal mood and affect  Allergies  Allergen Reactions   Crab [Shellfish Allergy] Shortness Of Breath and Swelling   Erythromycin Swelling and Rash   Hm Lidocaine  Patch [Lidocaine ]     Burning on skin   Fish Allergy     Swelling    Metoclopramide Other (  See Comments)    Slurred speech and unable to move muscles,  Caused her to drag her feet   Sulfacetamide     Other reaction(s): itching, rash   Sulfacetamide Sodium-Sulfur Other (See Comments)    Other reaction(s): itching, rash   Doxycycline      GI Intolerance   Levaquin [Levofloxacin In D5w] Other (See Comments)    Causes irregular heart beat   Morphine Nausea And Vomiting and Rash    Irregular heart beat   Sulfa Antibiotics Rash    Past Medical History:  Diagnosis Date   Arthritis    Asthma    Carotid stenosis, bilateral    Carpal tunnel syndrome    Chronic low back pain     COPD (chronic obstructive pulmonary disease) (HCC)    Diabetes mellitus without complication (HCC)    type II    Diabetic neuropathy (HCC)    Diverticulitis    Dyspnea    mild    Frequent falls    GERD (gastroesophageal reflux disease)    History of bronchitis    Hypercholesterolemia    Hypertension    IBS (irritable bowel syndrome)    Osteoarthritis    knee   Palpitations    Right ventricular enlargement    per pt report   Sleep apnea    mild but no sleep apnea    TMJ (dislocation of temporomandibular joint)    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    Vitamin D deficiency    Family History  Problem Relation Age of Onset   Allergic rhinitis Mother    Diabetes Father        type 2   Heart disease Father    Hypertension Father    Allergic rhinitis Father    Allergic rhinitis Sister    Collagen disease Sister    Heart disease Brother    Hypertension Brother    Angioedema Neg Hx    Asthma Neg Hx    Atopy Neg Hx    Eczema Neg Hx    Immunodeficiency Neg Hx    Urticaria Neg Hx    Social History   Socioeconomic History   Marital status: Widowed    Spouse name: Not on file   Number of children: 3   Years of education: Not on file   Highest education level: 11th grade  Occupational History    Comment: NA  Tobacco Use   Smoking status: Former    Current packs/day: 0.00    Average packs/day: 0.5 packs/day for 30.0 years (15.0 ttl pk-yrs)    Types: Cigarettes    Start date: 12/08/1964    Quit date: 12/08/1994    Years since quitting: 29.6   Smokeless tobacco: Never  Vaping Use   Vaping status: Never Used  Substance and Sexual Activity   Alcohol use: No   Drug use: No   Sexual activity: Not Currently  Other Topics Concern   Not on file  Social History Narrative   Lives with daughter   Caffeine- coffee 12 oz   Social Drivers of Corporate investment banker Strain: Low Risk  (02/09/2024)   Overall Financial Resource Strain (CARDIA)    Difficulty of Paying  Living Expenses: Not very hard  Food Insecurity: Food Insecurity Present (02/09/2024)   Hunger Vital Sign    Worried About Running Out of Food in the Last Year: Never true    Ran Out of Food in the Last Year: Sometimes true  Transportation  Needs: No Transportation Needs (02/09/2024)   PRAPARE - Administrator, Civil Service (Medical): No    Lack of Transportation (Non-Medical): No  Physical Activity: Sufficiently Active (02/09/2024)   Exercise Vital Sign    Days of Exercise per Week: 4 days    Minutes of Exercise per Session: 40 min  Stress: No Stress Concern Present (02/09/2024)   [employer] of Occupational Health - Occupational Stress Questionnaire    Feeling of Stress : Not at all  Social Connections: Moderately Integrated (02/09/2024)   Social Connection and Isolation Panel    Frequency of Communication with Friends and Family: Twice a week    Frequency of Social Gatherings with Friends and Family: Twice a week    Attends Religious Services: More than 4 times per year    Active Member of [employer] or Organizations: Yes    Attends Banker Meetings: More than 4 times per year    Marital Status: Widowed  Intimate Partner Violence: Not At Risk (05/25/2023)   Humiliation, Afraid, Rape, and Kick questionnaire    Fear of Current or Ex-Partner: No    Emotionally Abused: No    Physically Abused: No    Sexually Abused: No             Rolinda Rogue, MD 07/31/24 [phone]

## 2024-08-05 ENCOUNTER — Ambulatory Visit (HOSPITAL_COMMUNITY)
Admission: EM | Admit: 2024-08-05 | Discharge: 2024-08-05 | Disposition: A | Discharge status: Home / Self Care | Attending: Emergency Medicine | Admitting: Emergency Medicine

## 2024-08-05 ENCOUNTER — Encounter (HOSPITAL_COMMUNITY): Payer: Self-pay | Admitting: *Deleted

## 2024-08-05 DIAGNOSIS — R002 Palpitations: Secondary | ICD-10-CM | POA: Diagnosis not present

## 2024-08-05 DIAGNOSIS — N3001 Acute cystitis with hematuria: Secondary | ICD-10-CM | POA: Insufficient documentation

## 2024-08-05 LAB — POCT URINALYSIS DIP (MANUAL ENTRY)
Glucose, UA: NEGATIVE mg/dL
Nitrite, UA: NEGATIVE
Protein Ur, POC: >=300 mg/dL — AB
Spec Grav, UA: 1.025 (ref 1.010–1.025)
Urobilinogen, UA: 1 U/dL
pH, UA: 6 (ref 5.0–8.0)

## 2024-08-05 MED ORDER — NITROFURANTOIN MONOHYD MACRO 100 MG PO CAPS: 100.0000 mg | ORAL_CAPSULE | Freq: Two times a day (BID) | ORAL | 0 refills | Status: AC

## 2024-08-05 NOTE — ED Triage Notes (Signed)
 She states her vagina is burning like a fire down there X today. She states she had diarrhea 2 days ago and she wears depends. She states her daughter gave her a pill that turned her pee red.   She states she has not had sex since 2005  She is on amox for asthma issues

## 2024-08-05 NOTE — ED Provider Notes (Signed)
 [facility]    CSN: [id number] Arrival date & time: 08/05/24  1845      History   Chief Complaint Chief Complaint  Patient presents with   Dysuria    HPI Imagene DOREENE FORREY is a 68 y.o. female.  Here with dysuria that started today, with urgency  Burning like a fire in the urethra No hematuria. No fever or flank pain. Denies abdominal pain  She had diarrhea 2 days ago, wears depends, thinks there was some stool in the frontal area.   Her daughter gave her a pill that turned her pee red  She is on her last day of amoxicillin  for sinusitis   Past Medical History:  Diagnosis Date   Arthritis    Asthma    Carotid stenosis, bilateral    Carpal tunnel syndrome    Chronic low back pain    COPD (chronic obstructive pulmonary disease) (HCC)    Diabetes mellitus without complication (HCC)    type II    Diabetic neuropathy (HCC)    Diverticulitis    Dyspnea    mild    Frequent falls    GERD (gastroesophageal reflux disease)    History of bronchitis    Hypercholesterolemia    Hypertension    IBS (irritable bowel syndrome)    Osteoarthritis    knee   Palpitations    Right ventricular enlargement    per pt report   Sleep apnea    mild but no sleep apnea    TMJ (dislocation of temporomandibular joint)    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    Vitamin D deficiency     Patient Active Problem List   Diagnosis Date Noted   Primary hypertension 02/13/2024   Diabetes mellitus with coincident hypertension (HCC) 02/13/2024   Class 1 obesity due to excess calories with body mass index (BMI) of 33.0 to 33.9 in adult 02/13/2024   Chronic maxillary sinusitis 01/30/2024   Epistaxis 12/25/2023   Hypertrophy of nasal turbinates 12/25/2023   COVID-19 vaccination declined 10/24/2023   Need for influenza vaccination 10/24/2023   Class 1 obesity without serious comorbidity with body mass index (BMI) of 30.0 to 30.9 in adult 10/24/2023   Encounter for annual  health examination 08/31/2023   Type 2 diabetes mellitus with diabetic neuropathy, without long-term current use of insulin  (HCC) 08/31/2023   Estrogen deficiency 08/31/2023   Herpes zoster vaccination declined 08/31/2023   Influenza vaccination declined 08/31/2023   Post-nasal drip 08/31/2023   Muscle weakness (generalized) 08/31/2023   Degeneration of lumbar intervertebral disc 07/26/2023   Diabetic peripheral neuropathy (HCC) 07/26/2023   Left leg weakness 07/26/2023   Lumbar radiculopathy 07/26/2023   Pain in left knee 02/15/2022   Sciatica, left side 02/15/2022   Pain in left shoulder 11/16/2021   Internal hemorrhoids 11/03/2021   Leg cramps 11/03/2021   Myalgia 11/03/2021   Adenomatous polyp of colon 12/04/2020   Anal fissure 12/04/2020   Chronic idiopathic constipation 12/04/2020   Constipation 12/04/2020   Dysphagia 12/04/2020   Gastroesophageal reflux disease 12/04/2020   Rectal pain 12/04/2020   Right upper quadrant pain 12/04/2020   Dizzy 12/11/2017   Pharyngeal dysphagia 12/11/2017   Referred ear pain, bilateral 12/11/2017   Diabetes mellitus without complication (HCC) 06/29/2017   Family history of glaucoma 06/29/2017   Hyperopia of both eyes with astigmatism and presbyopia 06/29/2017   Keratoconjunctivitis sicca of both eyes not specified as Sjogren's 06/29/2017   Nuclear sclerotic cataract of  both eyes 06/29/2017   Vitreous floater, bilateral 06/29/2017   Biceps tendinitis of right shoulder 04/05/2017   Impingement syndrome of right shoulder 04/05/2017   Arthritis of right acromioclavicular joint 04/05/2017   Partial nontraumatic tear of rotator cuff, right 04/05/2017    Past Surgical History:  Procedure Laterality Date   ABDOMINAL HYSTERECTOMY  1993   CARPAL TUNNEL RELEASE Right    CERVICAL DISC SURGERY  04/29/2015   [place]le [place]   ESOPHAGEAL MANOMETRY N/A 03/03/2021   Procedure: ESOPHAGEAL MANOMETRY (EM);  Surgeon: Dianna Specking, MD;   Location: [facility];  Service: Endoscopy;  Laterality: N/A;   ESOPHAGOGASTRODUODENOSCOPY ENDOSCOPY     with esophagus being stretched twice per pt,    EYE SURGERY     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      Home Medications    Prior to Admission medications   Medication Sig Start Date End Date Taking? Authorizing Provider  acyclovir  (ZOVIRAX ) 400 MG tablet Take 1 tablet (400 mg total) by mouth 2 (two) times daily. 07/03/24  Yes Georgina Speaks, FNP  amLODipine  (NORVASC ) 5 MG tablet Take 1 tablet (5 mg total) by mouth daily. 10/10/23  Yes Tobb, Kardie, DO  amoxicillin  (AMOXIL ) 875 MG tablet Take 1 tablet (875 mg total) by mouth 2 (two) times daily for 10 days. 07/27/24 08/08/24 Yes Hagler, Redell, MD  ascorbic acid (VITAMIN C) 1000 MG tablet Take 1 tablet by mouth daily.   Yes [provider]  budesonide -formoterol  (SYMBICORT ) 160-4.5 MCG/ACT inhaler Inhale 2 puffs into the lungs 2 (two) times daily with a spacer. 04/18/24  Yes Padgett, Danita Macintosh, MD  Carbinoxamine  Maleate 4 MG TABS Take 2 tablets (8 mg total) by mouth 2 (two) times daily. 06/04/24  Yes Padgett, Danita Macintosh, MD  cholecalciferol (VITAMIN D3) 25 MCG (1000 UNIT) tablet Take 2,000 Units by mouth daily.   Yes [provider]  famotidine  (PEPCID ) 20 MG tablet Take 1 tablet (20 mg total) by mouth 1 to 2 times daily. 04/09/24  Yes Padgett, Danita Macintosh, MD  fluticasone  (FLONASE ) 50 MCG/ACT nasal spray Place 2 sprays into both nostrils daily. 12/25/23  Yes   glipiZIDE  (GLUCOTROL ) 5 MG tablet Take 1 tablet (5 mg total) by mouth daily before breakfast. Hold if blood sugar is less than 100 07/18/24  Yes Motwani, Komal, MD  insulin  glargine (LANTUS ) 100 UNIT/ML Solostar Pen Inject 10 Units into the skin  daily. Patient taking differently: Inject 14 Units into the skin daily. 05/28/24  Yes Motwani, Komal, MD  metFORMIN  (GLUCOPHAGE -XR) 500 MG 24 hr tablet Take 1 tablet (500 mg total) by mouth 2 (two) times daily with a meal. 03/14/24  Yes Motwani, Komal, MD  metoprolol  succinate (TOPROL -XL) 25 MG 24 hr tablet Take 1 tablet (25 mg total) by mouth daily. 10/10/23  Yes Tobb, Kardie, DO  montelukast  (SINGULAIR ) 10 MG tablet Take 1 tablet (10 mg total) by mouth daily. 04/18/24  Yes Padgett, Danita Macintosh, MD  Multiple Vitamin (MULTIVITAMIN WITH MINERALS) TABS tablet Take 1 tablet by mouth daily.   Yes [provider]  nitrofurantoin , macrocrystal-monohydrate, (MACROBID ) 100 MG capsule Take 1 capsule (100 mg total) by mouth 2 (two) times daily  for 7 days. 08/05/24 08/12/24 Yes Cannan Beeck, Asberry, PA-C  pantoprazole  (PROTONIX ) 40 MG tablet Take 1 tablet (40 mg total) by mouth daily. 07/10/24  Yes Padgett, Danita Macintosh, MD  pravastatin  (PRAVACHOL ) 20 MG tablet Take 1 tablet (20 mg total) by mouth every evening. 10/10/23 10/22/24 Yes Tobb, Kardie, DO  predniSONE  (DELTASONE ) 20 MG tablet Take 2 tablets (40 mg total) by mouth daily. 07/27/24  Yes Rolinda Rogue, MD  Accu-Chek Softclix Lancets lancets Use to check blood sugar 3 (three) times daily (morning, noon, and bedtime) 03/14/24   Motwani, Obadiah, MD  albuterol  (PROVENTIL ) (2.5 MG/3ML) 0.083% nebulizer solution Take 3 mLs (2.5 mg total) by nebulization every 4 (four) hours as needed for wheezing or shortness of breath. 12/14/23   Jeneal Danita Macintosh, MD  albuterol  (VENTOLIN  HFA) 108 (90 Base) MCG/ACT inhaler Inhale 2 puffs into the lungs every 4 (four) hours as needed for wheezing or shortness of breath. 12/14/23   Jeneal Danita Macintosh, MD  Artificial Tear Ointment (DRY EYES OP) Place 1-2 drops into both eyes daily as needed (for dry eyes).  Patient not taking: Reported on 07/27/2024    [provider]  azelastine  (OPTIVAR ) 0.05 % ophthalmic  solution Place 1 drop into both eyes 2 (two) times daily. 04/18/24   Jeneal Danita Macintosh, MD  Baclofen  5 MG TABS Take 1 tablet (5 mg total) by mouth 2 (two) times daily as needed. Patient not taking: Reported on 07/27/2024 04/30/24   Anant Agard, Asberry, PA-C  Blood Glucose Monitoring Suppl DEVI 1 each by Does not apply route in the morning, at noon, and at bedtime. May substitute to any manufacturer covered by patient's insurance. 03/14/24   Motwani, Komal, MD  Carbinoxamine  Maleate 4 MG TABS Take 2 tablets (8 mg total) by mouth 2 (two) times daily. 04/22/24   Jeneal Danita Macintosh, MD  clotrimazole  (LOTRIMIN ) 1 % cream Apply 1 Application topically to rash 2 (two) times daily. 06/16/24   Adalyn Pennock, Asberry, PA-C  Continuous Glucose Receiver (DEXCOM G7 RECEIVER) DEVI 1 Device by Does not apply route continuous. 03/14/24   Motwani, Komal, MD  Continuous Glucose Sensor (DEXCOM G7 SENSOR) MISC Use to monitor blood sugar levels continuously, changing sensor every 10 days. 07/18/24   Motwani, Komal, MD  cycloSPORINE (RESTASIS) 0.05 % ophthalmic emulsion Place 1 drop into both eyes 2 times daily. 03/08/22   [provider]  EPINEPHrine  0.3 mg/0.3 mL IJ SOAJ injection Inject 0.3 mg into the muscle as needed for anaphylaxis. 12/14/23   Jeneal Danita Macintosh, MD  Insulin  Pen Needle (PEN NEEDLES) 32G X 4 MM MISC 1 Device by Does not apply route daily. 03/15/24   Motwani, Komal, MD  Lancets Misc. (ACCU-CHEK SOFTCLIX LANCET DEV) KIT Use to check blood sugar in the morning, at noon, and at bedtime. 03/14/24   Dartha Obadiah, MD  lidocaine  (XYLOCAINE ) 2 % solution Rinse and gargle with 50ml's as directed in the mouth or throat every 4 hours as needed. 03/31/24   White, Elizabeth A, PA-C  nitroGLYCERIN  (NITROSTAT ) 0.4 MG SL tablet Place 1 tablet (0.4 mg total) under the tongue every 5 (five) minutes as needed for chest pain as directed. 07/10/24   Tobb, Kardie, DO  ondansetron  (ZOFRAN -ODT) 4 MG disintegrating tablet  Take 1 tablet (4 mg total) by mouth every 8 (eight) hours as needed for nausea or vomiting. Patient not taking: Reported on 07/27/2024 11/26/23   Teresa Almarie LABOR, PA-C  esomeprazole  (NEXIUM ) 40 MG capsule Take 1 capsule (40 mg total) by  mouth daily. Patient not taking: Reported on 07/08/2020 04/24/19 08/06/20  Armenta Canning, MD    Family History Family History  Problem Relation Age of Onset   Allergic rhinitis Mother    Diabetes Father        type 2   Heart disease Father    Hypertension Father    Allergic rhinitis Father    Allergic rhinitis Sister    Collagen disease Sister    Heart disease Brother    Hypertension Brother    Angioedema Neg Hx    Asthma Neg Hx    Atopy Neg Hx    Eczema Neg Hx    Immunodeficiency Neg Hx    Urticaria Neg Hx     Social History Social History   Tobacco Use   Smoking status: Former    Current packs/day: 0.00    Average packs/day: 0.5 packs/day for 30.0 years (15.0 ttl pk-yrs)    Types: Cigarettes    Start date: 12/08/1964    Quit date: 12/08/1994    Years since quitting: 29.6   Smokeless tobacco: Never  Vaping Use   Vaping status: Never Used  Substance Use Topics   Alcohol use: No   Drug use: No     Allergies   Crab [shellfish allergy], Erythromycin, Hm lidocaine  patch [lidocaine ], Buprenorphine, Fish allergy, Metoclopramide, Doxycycline , Levaquin [levofloxacin in d5w], Morphine, and Sulfa antibiotics   Review of Systems Review of Systems  Genitourinary:  Positive for dysuria.    As per HPI  Physical Exam Triage Vital Signs ED Triage Vitals  Encounter Vitals Group     BP 08/05/24 1938 (!) 148/78     Girls Systolic BP Percentile --      Girls Diastolic BP Percentile --      Boys Systolic BP Percentile --      Boys Diastolic BP Percentile --      Pulse Rate 08/05/24 1938 (!) 104     Resp 08/05/24 1938 18     Temp 08/05/24 1938 98.5 F (36.9 C)     Temp Source 08/05/24 1938 Oral     SpO2 08/05/24 1938 96 %     Weight --       Height --      Head Circumference --      Peak Flow --      Pain Score 08/05/24 1934 10     Pain Loc --      Pain Education --      Exclude from Growth Chart --    No data found.  Updated Vital Signs BP (!) 148/78 (BP Location: Right Arm)   Pulse (!) 104   Temp 98.5 F (36.9 C) (Oral)   Resp 18   SpO2 96%    Physical Exam Vitals and nursing note reviewed.  Constitutional:      General: She is not in acute distress. HENT:     Mouth/Throat:     Mouth: Mucous membranes are moist.     Pharynx: Oropharynx is clear.  Eyes:     Conjunctiva/sclera: Conjunctivae normal.     Pupils: Pupils are equal, round, and reactive to light.  Cardiovascular:     Rate and Rhythm: Normal rate and regular rhythm.     Heart sounds: Normal heart sounds.  Pulmonary:     Effort: Pulmonary effort is normal.     Breath sounds: Normal breath sounds.  Abdominal:     General: Bowel sounds are normal.     Palpations: Abdomen is soft.  Tenderness: There is no abdominal tenderness. There is no right CVA tenderness or left CVA tenderness.  Neurological:     Mental Status: She is alert and oriented to person, place, and time.     UC Treatments / Results  Labs (all labs ordered are listed, but only abnormal results are displayed) Labs Reviewed  POCT URINALYSIS DIP (MANUAL ENTRY) - Abnormal; Notable for the following components:      Result Value   Color, UA red (*)    Bilirubin, UA moderate (*)    Ketones, POC UA small (15) (*)    Blood, UA large (*)    Protein Ur, POC >=300 (*)    Leukocytes, UA Large (3+) (*)    All other components within normal limits  URINE CULTURE    EKG  Radiology No results found.  Procedures Procedures  Medications Ordered in UC Medications - No data to display  Initial Impression / Assessment and Plan / UC Course  I have reviewed the triage vital signs and the nursing notes.  Pertinent labs & imaging results that were available during my care of  the patient were reviewed by me and considered in my medical decision making (see chart for details).  UA large leuks, large RBC Culture is pending Treat for acute cystitis with macrobid  BID x 7 days. Reviewed kidney function from last month, no dosage adjustment needed. Increase water, continue proper hygiene Return and ED precautions Agrees to plan, no questions   Final Clinical Impressions(s) / UC Diagnoses   Final diagnoses:  Acute cystitis with hematuria     Discharge Instructions      We will call you if anything on urine culture requires a change in therapy (about 1-2 days)  In the meantime I am treating you for a urinary tract infection. Please take the antibiotic MACROBID  as prescribed, with food to avoid upset stomach. Drink lots of fluids!  Please watch for worsening symptoms and return if needed     ED Prescriptions     Medication Sig Dispense Auth. Provider   nitrofurantoin , macrocrystal-monohydrate, (MACROBID ) 100 MG capsule Take 1 capsule (100 mg total) by mouth 2 (two) times daily for 7 days. 14 capsule Merrin Mcvicker, Asberry, PA-C      PDMP not reviewed this encounter.   Dajuana Palen, PA-C 08/05/24 2052

## 2024-08-05 NOTE — Discharge Instructions (Addendum)
 We will call you if anything on urine culture requires a change in therapy (about 1-2 days)  In the meantime I am treating you for a urinary tract infection. Please take the antibiotic MACROBID  as prescribed, with food to avoid upset stomach. Drink lots of fluids!  Please watch for worsening symptoms and return if needed

## 2024-08-08 ENCOUNTER — Telehealth: Payer: Self-pay | Admitting: Pharmacist

## 2024-08-08 ENCOUNTER — Telehealth: Payer: Self-pay | Admitting: Pharmacy Technician

## 2024-08-08 ENCOUNTER — Ambulatory Visit (HOSPITAL_COMMUNITY): Payer: Self-pay

## 2024-08-08 DIAGNOSIS — E1142 Type 2 diabetes mellitus with diabetic polyneuropathy: Secondary | ICD-10-CM

## 2024-08-08 LAB — URINE CULTURE: Culture: >=100000 — AB

## 2024-08-08 NOTE — Progress Notes (Signed)
   08/08/2024 Name: Pamela Roberson MRN: [id number] DOB: January 23, 1956  Patient is appearing on a report for [facility] [place] [facility] and last engaged with the clinical pharmacist to discuss diabetes on 07/25/2024. Contacted patient today to discuss diabetes management and completed medication review.   Diabetes Plan from last clinical pharmacist appointment:  Patient has an upcoming appointment with Gaines Ada, FNP on 08/22/2024.  I will follow up with the Patient after her upcoming appointment.     Medication Adherence Barriers Identified:  Patient made recommended medication changes per plan: No Patient is followed by [facility] She was started on Glipizide   5mg . She informs she had a bad reaction to that medication and stopped taking it. She informs she takes Metformin  twice a day and Lantus  once a day. Access issues with any new medication or testing device: No Patient does not report any cost concerns at this time. Per Dr Annemarie, Metformin  last filled for 60 day supply on 8/25 and Lantus  was last filled on 05/28/24 for 90 day supply  Patient is checking blood sugars as prescribed: Yes Patient uses Dexcom CGM to check blood sugar.She currently does not have a sensor on. She informs she had gotten blood sugars down in the 120s in the morning. She informs she has been sick and is on 2 antibiotics and Prednisone . She informs the Prednisone  has made blood sugars go back up into the 200s She informs she has 4 more tablets to take. She informs the 7 day average for her blood sugar is 178.   Medication Adherence Barriers Addressed/Actions Taken:  Reviewed medication changes per plan from last clinical pharmacist note Educated patient to contact pharmacy regarding new prescriptions/refills Reviewed instructions for monitoring blood sugars at home and reminded patient to keep a written log to review with pharmacist Reminded patient of date/time of upcoming clinical pharmacist follow  up and any upcoming PCP/specialists visits. Patient denies transportation barriers to the appointment. Yes  Next clinical pharmacist appointment is scheduled for: TBD around 9/18 after PCP appointment  Kate Caddy, CPhT [facility] Office: [phone] Email: [email or web address]

## 2024-08-08 NOTE — Progress Notes (Signed)
   08/08/2024  Patient ID: Pamela Roberson, female   DOB: Mar 03, 1956, 68 y.o.   MRN: [id number]  Patient was called to follow up after she spoke with Jill Simcox, CPhT this morning.  HIPAA identifiers were obtained. Pamela Roberson is a 68 year old female with multiple medical conditions including but not limited to:  type 2 diabetes, myalgia, peripheral neuropathy, GERD, myalgia, hypertension and chronic sinusitis.    She visited urgent care twice this week--once for sinusitis and 2 days later for urinary tract infection.  Patient reported her blood sugars had been up in the 200s with the prednisone  that was prescribed for her.    Her current diabetes regimen includes:   Lantus  14 units daily Metfomrin XR 500 mg 1 tablet two times daily  Patient was advised to decrease simple carbohydrates and continue current therapy.  A1cL Lab Results  Component Value Date   HGBA1C 7.5 (A) 07/18/2024   HGBA1C 8.7 (A) 04/16/2024   HGBA1C 9.1 (H) 01/15/2024   Lipid Panel     Component Value Date/Time   CHOL 138 01/15/2024 1237   CHOL 114 08/21/2023 1156   TRIG 158 (H) 01/15/2024 1237   HDL 54 01/15/2024 1237   HDL 56 08/21/2023 1156   CHOLHDL 2.6 01/15/2024 1237   LDLCALC 60 01/15/2024 1237   LABVLDL 22 08/21/2023 1156   On statin therapy- LDL 60-Pravastatin  20 mg --TG elevated--may get some TG lowering with a more potent statin.  She was started on glipizide  but stopped taking it due to hypoglycemia symptoms.  Patient was advised to continue current therapy. I will follow up with her in 2 months.   Cassius DOROTHA Brought, PharmD, BCACP Clinical Pharmacist [phone]

## 2024-08-12 DIAGNOSIS — R002 Palpitations: Secondary | ICD-10-CM

## 2024-08-16 ENCOUNTER — Other Ambulatory Visit (HOSPITAL_COMMUNITY): Payer: Self-pay

## 2024-08-19 ENCOUNTER — Other Ambulatory Visit: Payer: Self-pay

## 2024-08-19 ENCOUNTER — Other Ambulatory Visit (HOSPITAL_COMMUNITY): Payer: Self-pay

## 2024-08-22 ENCOUNTER — Ambulatory Visit (INDEPENDENT_AMBULATORY_CARE_PROVIDER_SITE_OTHER): Payer: 59 | Admitting: Nurse Practitioner

## 2024-08-22 ENCOUNTER — Other Ambulatory Visit (HOSPITAL_COMMUNITY): Payer: Self-pay

## 2024-08-22 ENCOUNTER — Encounter: Payer: Self-pay | Admitting: Nurse Practitioner

## 2024-08-22 VITALS — BP 124/70 | HR 97 | Temp 98.6°F | Ht 66.0 in | Wt 200.2 lb

## 2024-08-22 DIAGNOSIS — L700 Acne vulgaris: Secondary | ICD-10-CM | POA: Diagnosis not present

## 2024-08-22 DIAGNOSIS — Z2821 Immunization not carried out because of patient refusal: Secondary | ICD-10-CM | POA: Diagnosis not present

## 2024-08-22 DIAGNOSIS — E6609 Other obesity due to excess calories: Secondary | ICD-10-CM

## 2024-08-22 DIAGNOSIS — E1140 Type 2 diabetes mellitus with diabetic neuropathy, unspecified: Secondary | ICD-10-CM

## 2024-08-22 DIAGNOSIS — R438 Other disturbances of smell and taste: Secondary | ICD-10-CM | POA: Diagnosis not present

## 2024-08-22 DIAGNOSIS — Z6832 Body mass index (BMI) 32.0-32.9, adult: Secondary | ICD-10-CM

## 2024-08-22 DIAGNOSIS — E66811 Obesity, class 1: Secondary | ICD-10-CM

## 2024-08-22 DIAGNOSIS — Z0000 Encounter for general adult medical examination without abnormal findings: Secondary | ICD-10-CM

## 2024-08-22 DIAGNOSIS — I10 Essential (primary) hypertension: Secondary | ICD-10-CM | POA: Diagnosis not present

## 2024-08-22 DIAGNOSIS — Z79899 Other long term (current) drug therapy: Secondary | ICD-10-CM

## 2024-08-22 LAB — POCT URINALYSIS DIP (CLINITEK)
Bilirubin, UA: NEGATIVE
Blood, UA: NEGATIVE
Glucose, UA: NEGATIVE mg/dL
Ketones, POC UA: NEGATIVE mg/dL
Leukocytes, UA: NEGATIVE
Nitrite, UA: NEGATIVE
POC PROTEIN,UA: NEGATIVE
Spec Grav, UA: 1.025 (ref 1.010–1.025)
Urobilinogen, UA: 0.2 U/dL
pH, UA: 5.5 (ref 5.0–8.0)

## 2024-08-22 MED ORDER — NYSTATIN 100000 UNIT/ML MT SUSP
5.0000 mL | Freq: Four times a day (QID) | OROMUCOSAL | 0 refills | Status: AC
  Filled 2024-08-22: qty 60, 3d supply, fill #0

## 2024-08-22 NOTE — Progress Notes (Signed)
 LILLETTE Kristeen JINNY Gladis, CMA,acting as a Neurosurgeon for Gaines Ada, FNP.,have documented all relevant documentation on the behalf of Gaines Ada, FNP,as directed by  Gaines Ada, FNP while in the presence of Gaines Ada, FNP.  Subjective:    Patient ID: Pamela Roberson , female    DOB: 10-24-1956 , 68 y.o.   MRN: [id number]  Chief Complaint  Patient presents with   Annual Exam    Patient presents today for HM, Patient reports compliance with medication. Patient denies any chest pain, SOB, or headaches. Patient has previously had EKG    HPI  Discussed the use of AI scribe software for clinical note transcription with the patient, who gave verbal consent to proceed.  History of Present Illness Pamela Roberson is a 68 year old female with diabetes who presents with concerns about her blood sugar management and recent health issues.  Her current regimen includes Lantus  14 units and metformin  500 mg twice daily. She was previously on glipizide  but discontinued it due to hypoglycemic episodes. An attempt to increase her insulin  dose to 16 units also resulted in low blood sugar levels.  She was recently treated for a urinary tract infection and now suspects oral thrush due to antibiotic use, as she has a persistent unpleasant taste in her mouth despite maintaining oral hygiene.  She experiences knee pain and has been receiving injections to avoid surgery, though she finds them ineffective. Walking for exercise is challenging due to knee issues.  Her appetite has decreased, which she attributes to her medications, and she reports reduced food intake.  She has a history of a partial hysterectomy and is considering surgery for a torn rotator cuff in her left shoulder, having had similar surgery on her right shoulder five years ago. She reports that her shoulder was 'all red' and that she was told she has arthritis affecting her neck and shoulder area.  She has a small lump on her scalp present  for about five or six months, which has not resolved despite using warm compresses. She shaves the area and uses a new razor each time.  She is considering weight loss surgery and has been trying to lose weight through exercise and dietary changes. Previous trials of medications like Ozempic  or Wegovy  resulted in adverse effects.  She experiences frequent nausea and acid reflux, which she attributes to a hiatal hernia and possibly bacterial issues in her stomach.  She lives with her granddaughter, who recently broke her arm, and supports her son who has schizophrenia and lives independently. She feels overwhelmed with her responsibilities.   Past Medical History:  Diagnosis Date   Arthritis    Asthma    Carotid stenosis, bilateral    Carpal tunnel syndrome    Chronic low back pain    COPD (chronic obstructive pulmonary disease) (HCC)    Diabetes mellitus without complication (HCC)    type II    Diabetic neuropathy (HCC)    Diverticulitis    Dyspnea    mild    Frequent falls    GERD (gastroesophageal reflux disease)    History of bronchitis    Hypercholesterolemia    Hypertension    IBS (irritable bowel syndrome)    Osteoarthritis    knee   Palpitations    Right ventricular enlargement    per pt report   Sleep apnea    mild but no sleep apnea    TMJ (dislocation of temporomandibular joint)    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    Vitamin D deficiency      Family History  Problem Relation Age of Onset   Allergic rhinitis Mother    Diabetes Father        type 2   Heart disease Father    Hypertension Father    Allergic rhinitis Father    Allergic rhinitis Sister    Collagen disease Sister    Heart disease Brother    Hypertension Brother    Angioedema Neg Hx    Asthma Neg Hx    Atopy Neg Hx    Eczema Neg Hx    Immunodeficiency Neg Hx    Urticaria Neg Hx      Current Outpatient Medications:    Accu-Chek Softclix Lancets lancets, Use to check blood sugar 3  (three) times daily (morning, noon, and bedtime), Disp: 100 each, Rfl: 3   acyclovir  (ZOVIRAX ) 400 MG tablet, Take 1 tablet (400 mg total) by mouth 2 (two) times daily., Disp: 30 tablet, Rfl: 4   albuterol  (PROVENTIL ) (2.5 MG/3ML) 0.083% nebulizer solution, Take 3 mLs (2.5 mg total) by nebulization every 4 (four) hours as needed for wheezing or shortness of breath., Disp: 75 mL, Rfl: 1   albuterol  (VENTOLIN  HFA) 108 (90 Base) MCG/ACT inhaler, Inhale 2 puffs into the lungs every 4 (four) hours as needed for wheezing or shortness of breath., Disp: 18 g, Rfl: 1   amLODipine  (NORVASC ) 5 MG tablet, Take 1 tablet (5 mg total) by mouth daily., Disp: 90 tablet, Rfl: 3   ascorbic acid (VITAMIN C) 1000 MG tablet, Take 1 tablet by mouth daily., Disp: , Rfl:    azelastine  (OPTIVAR ) 0.05 % ophthalmic solution, Place 1 drop into both eyes 2 (two) times daily., Disp: 6 mL, Rfl: 5   Blood Glucose Monitoring Suppl DEVI, 1 each by Does not apply route in the morning, at noon, and at bedtime. May substitute to any manufacturer covered by patient's insurance., Disp: 1 each, Rfl: 0   budesonide -formoterol  (SYMBICORT ) 160-4.5 MCG/ACT inhaler, Inhale 2 puffs into the lungs 2 (two) times daily with a spacer., Disp: 10.2 g, Rfl: 5   Carbinoxamine  Maleate 4 MG TABS, Take 2 tablets (8 mg total) by mouth 2 (two) times daily., Disp: 120 tablet, Rfl: 5   cholecalciferol (VITAMIN D3) 25 MCG (1000 UNIT) tablet, Take 2,000 Units by mouth daily., Disp: , Rfl:    clotrimazole  (LOTRIMIN ) 1 % cream, Apply 1 Application topically to rash 2 (two) times daily., Disp: 30 g, Rfl: 0   Continuous Glucose Receiver (DEXCOM G7 RECEIVER) DEVI, 1 Device by Does not apply route continuous., Disp: 1 each, Rfl: 0   Continuous Glucose Sensor (DEXCOM G7 SENSOR) MISC, Use to monitor blood sugar levels continuously, changing sensor every 10 days., Disp: 9 each, Rfl: 0   cycloSPORINE (RESTASIS) 0.05 % ophthalmic emulsion, Place 1 drop into both eyes 2 times  daily., Disp: , Rfl:    EPINEPHrine  0.3 mg/0.3 mL IJ SOAJ injection, Inject 0.3 mg into the muscle as needed for anaphylaxis., Disp: 1 each, Rfl: 1   famotidine  (PEPCID ) 20 MG tablet, Take 1 tablet (20 mg total) by mouth 1 to 2 times daily., Disp: 60 tablet, Rfl: 5   fluticasone  (FLONASE ) 50 MCG/ACT nasal spray, Place 2 sprays into both nostrils daily., Disp: 16 g, Rfl: 9   insulin  glargine (LANTUS ) 100 UNIT/ML Solostar Pen, Inject 10 Units into the skin daily. (Patient taking differently: Inject 14 Units into the skin daily.), Disp: 9 mL,  Rfl: 1   Insulin  Pen Needle (PEN NEEDLES) 32G X 4 MM MISC, 1 Device by Does not apply route daily., Disp: 200 each, Rfl: 2   Lancets Misc. (ACCU-CHEK SOFTCLIX LANCET DEV) KIT, Use to check blood sugar in the morning, at noon, and at bedtime., Disp: 1 kit, Rfl: 0   metFORMIN  (GLUCOPHAGE -XR) 500 MG 24 hr tablet, Take 1 tablet (500 mg total) by mouth 2 (two) times daily with a meal., Disp: 120 tablet, Rfl: 2   metoprolol  succinate (TOPROL -XL) 25 MG 24 hr tablet, Take 1 tablet (25 mg total) by mouth daily., Disp: 90 tablet, Rfl: 3   montelukast  (SINGULAIR ) 10 MG tablet, Take 1 tablet (10 mg total) by mouth daily., Disp: 30 tablet, Rfl: 5   Multiple Vitamin (MULTIVITAMIN WITH MINERALS) TABS tablet, Take 1 tablet by mouth daily., Disp: , Rfl:    nitroGLYCERIN  (NITROSTAT ) 0.4 MG SL tablet, Place 1 tablet (0.4 mg total) under the tongue every 5 (five) minutes as needed for chest pain as directed., Disp: 25 tablet, Rfl: 3   nystatin  (MYCOSTATIN ) 100000 UNIT/ML suspension, Take 5 mLs (500,000 Units total) by mouth 4 (four) times daily. Swish and spit, Disp: 60 mL, Rfl: 0   ondansetron  (ZOFRAN -ODT) 4 MG disintegrating tablet, Take 1 tablet (4 mg total) by mouth every 8 (eight) hours as needed for nausea or vomiting., Disp: 20 tablet, Rfl: 0   pantoprazole  (PROTONIX ) 40 MG tablet, Take 1 tablet (40 mg total) by mouth daily., Disp: 30 tablet, Rfl: 3   pravastatin  (PRAVACHOL ) 20  MG tablet, Take 1 tablet (20 mg total) by mouth every evening., Disp: 90 tablet, Rfl: 3   predniSONE  (DELTASONE ) 20 MG tablet, Take 2 tablets (40 mg total) by mouth daily., Disp: 10 tablet, Rfl: 0   Artificial Tear Ointment (DRY EYES OP), Place 1-2 drops into both eyes daily as needed (for dry eyes).  (Patient not taking: Reported on 08/23/2024), Disp: , Rfl:    glipiZIDE  (GLUCOTROL ) 5 MG tablet, Take 1 tablet (5 mg total) by mouth daily before breakfast. Hold if blood sugar is less than 100 (Patient not taking: Reported on 08/08/2024), Disp: 90 tablet, Rfl: 1   lidocaine  (XYLOCAINE ) 2 % solution, Rinse and gargle with 26ml's as directed in the mouth or throat every 4 hours as needed., Disp: 100 mL, Rfl: 1   Allergies  Allergen Reactions   Crab [Shellfish Allergy] Shortness Of Breath and Swelling   Erythromycin Swelling and Rash   Hm Lidocaine  Patch [Lidocaine ]     Burning on skin   Buprenorphine Dermatitis    buprenorphine hydrochloride   Fish Allergy     Swelling    Metoclopramide Other (See Comments)    Slurred speech and unable to move muscles,  Caused her to drag her feet   Doxycycline      GI Intolerance   Levaquin [Levofloxacin In D5w] Other (See Comments)    Causes irregular heart beat   Morphine Nausea And Vomiting and Rash    Irregular heart beat   Sulfa Antibiotics Rash    itching      The patient states she is status post hysterectomy.   Negative for Dysmenorrhea and Negative for Menorrhagia. Negative for: breast discharge, breast lump(s), breast pain and breast self exam. Associated symptoms include abnormal vaginal bleeding. Pertinent negatives include abnormal bleeding (hematology), anxiety, decreased libido, depression, difficulty falling sleep, dyspareunia, history of infertility, nocturia, sexual dysfunction, sleep disturbances, urinary incontinence, urinary urgency, vaginal discharge and vaginal itching. Diet regular.The patient states  her exercise level is    . The  patient's tobacco use is:  Social History   Tobacco Use  Smoking Status Former   Current packs/day: 0.00   Average packs/day: 0.5 packs/day for 30.0 years (15.0 ttl pk-yrs)   Types: Cigarettes   Start date: 12/08/1964   Quit date: 12/08/1994   Years since quitting: 29.7  Smokeless Tobacco Never  She has been exposed to passive smoke. The patient's alcohol use is:  Social History   Substance and Sexual Activity  Alcohol Use No    Review of Systems  Constitutional: Negative.   HENT: Negative.    Eyes: Negative.   Respiratory: Negative.    Cardiovascular: Negative.   Gastrointestinal: Negative.   Endocrine: Negative.   Genitourinary: Negative.   Musculoskeletal: Negative.   Skin: Negative.   Allergic/Immunologic: Negative.   Neurological: Negative.   Hematological: Negative.   Psychiatric/Behavioral: Negative.       Today's Vitals   08/22/24 1049  BP: 124/70  Pulse: 97  Temp: 98.6 F (37 C)  TempSrc: Oral  Weight: 200 lb 3.2 oz (90.8 kg)  Height: 5' 6 (1.676 m)  PainSc: 8   PainLoc: Knee   Body mass index is 32.31 kg/m.  Wt Readings from Last 3 Encounters:  08/22/24 200 lb 3.2 oz (90.8 kg)  07/18/24 199 lb (90.3 kg)  07/10/24 199 lb (90.3 kg)     Objective:  Physical Exam Vitals and nursing note reviewed.  Constitutional:      General: She is not in acute distress.    Appearance: Normal appearance. She is well-developed. She is obese.  HENT:     Head: Normocephalic and atraumatic.     Right Ear: Hearing, tympanic membrane, ear canal and external ear normal. There is no impacted cerumen.     Left Ear: Hearing, tympanic membrane, ear canal and external ear normal. There is no impacted cerumen.     Nose: Nose normal.     Mouth/Throat:     Mouth: Mucous membranes are moist.  Eyes:     General: Lids are normal.     Extraocular Movements: Extraocular movements intact.     Conjunctiva/sclera: Conjunctivae normal.     Pupils: Pupils are equal, round, and  reactive to light.     Funduscopic exam:    Right eye: No papilledema.        Left eye: No papilledema.  Neck:     Thyroid: No thyroid mass.     Vascular: No carotid bruit.  Cardiovascular:     Rate and Rhythm: Normal rate and regular rhythm.     Pulses: Normal pulses.     Heart sounds: Normal heart sounds. No murmur heard. Pulmonary:     Effort: Pulmonary effort is normal. No respiratory distress.     Breath sounds: Normal breath sounds. No wheezing.  Chest:     Chest wall: No mass.  Breasts:    Tanner Score is 5.     Right: Normal. No mass or tenderness.     Left: Normal. No mass or tenderness.  Abdominal:     General: Abdomen is flat. Bowel sounds are normal. There is no distension.     Palpations: Abdomen is soft.     Tenderness: There is no abdominal tenderness.  Musculoskeletal:        General: No swelling or tenderness. Normal range of motion.     Cervical back: Full passive range of motion without pain, normal range of motion and neck supple.  Right lower leg: No edema.     Left lower leg: No edema.  Lymphadenopathy:     Upper Body:     Right upper body: No supraclavicular, axillary or pectoral adenopathy.     Left upper body: No supraclavicular, axillary or pectoral adenopathy.  Skin:    General: Skin is warm and dry.     Capillary Refill: Capillary refill takes less than 2 seconds.     Findings: No bruising.  Neurological:     General: No focal deficit present.     Mental Status: She is alert and oriented to person, place, and time.     Cranial Nerves: No cranial nerve deficit.     Sensory: No sensory deficit.  Psychiatric:        Mood and Affect: Mood normal.        Behavior: Behavior normal.        Thought Content: Thought content normal.        Judgment: Judgment normal.        Assessment And Plan:     Encounter for annual health examination Assessment & Plan: Behavior modifications discussed and diet history reviewed.   Pt will continue to  exercise regularly and modify diet with low GI, plant based foods and decrease intake of processed foods.  Recommend intake of daily multivitamin, Vitamin D, and calcium .  Recommend mammogram and colonoscopy for preventive screenings, as well as recommend immunizations that include influenza, TDAP, and Shingles    Type 2 diabetes mellitus with diabetic neuropathy, without long-term current use of insulin  (HCC) Assessment & Plan: Blood sugar fluctuating with recent hypoglycemia after glipizide  initiation. Current regimen includes Lantus  and metformin . Blood sugar at 200. Emphasized diabetes control, especially pre-surgery. - Continue Lantus  14 units and metformin  500 mg twice daily. - Monitor blood sugar levels regularly. - Ensure diabetes is well-controlled before surgical interventions. - Continue f/u with Endocrinology  Orders: -     POCT URINALYSIS DIP (CLINITEK) -     Microalbumin / creatinine urine ratio -     CMP14+EGFR -     Hemoglobin A1c -     Lipid panel  Primary hypertension Assessment & Plan: Blood pressure is controlled. Continue current medications  Orders: -     CMP14+EGFR  COVID-19 vaccination declined  Herpes zoster vaccination declined  Influenza vaccination declined  Class 1 obesity due to excess calories with body mass index (BMI) of 32.0 to 32.9 in adult, unspecified whether serious comorbidity present Assessment & Plan: Considering weight loss surgery. Previous adverse effects with Ozempic /Wegovy . Emphasized heart health and diabetes control pre-surgery. - Refer to [facility] for weight loss surgery evaluation. - Discuss attending [facility] for dietary support. - [facility]  Orders: -     Amb Ref to [facility]  Other long term (current) drug therapy -     CBC with Differential/Platelet  Bad taste in mouth Assessment & Plan: Persistent bad taste possibly  due to recent antibiotics. No visible white patches. - Prescribe nystatin  solution, instruct to swish and spit.   Blackhead Assessment & Plan: Persistent blackhead not resolved with warm compresses. - Recommend over-the-counter blackhead removal solution. - Consider dermatologist referral if persistent.   Other orders -     Nystatin ; Take 5 mLs (500,000 Units total) by mouth 4 (four) times daily. Swish and spit  Dispense: 60 mL; Refill: 0    Return for 1 year physical, controlled DM check  4 months, needs AWV; schedule flu vaccine in October.  Patient was given opportunity to ask questions. Patient verbalized understanding of the plan and was able to repeat key elements of the plan. All questions were answered to their satisfaction.   Gaines Ada, FNP  I, Gaines Ada, FNP, have reviewed all documentation for this visit. The documentation on 08/22/24 for the exam, diagnosis, procedures, and orders are all accurate and complete.

## 2024-08-22 NOTE — Patient Instructions (Signed)
 Health Maintenance  Topic Date Due   Medicare Annual Wellness Visit  05/24/2024   COVID-19 Vaccine (3 - Moderna risk series) 09/07/2024*   Zoster (Shingles) Vaccine (1 of 2) 11/21/2024*   Flu Shot  03/04/2025*   Yearly kidney health urinalysis for diabetes  01/14/2025   Hemoglobin A1C  01/18/2025   Complete foot exam   02/12/2025   Eye exam for diabetics  05/15/2025   Yearly kidney function blood test for diabetes  07/10/2025   Breast Cancer Screening  05/14/2026   DTaP/Tdap/Td vaccine (2 - Td or Tdap) 05/29/2029   Colon Cancer Screening  07/26/2033   Pneumococcal Vaccine for age over 61  Completed   DEXA scan (bone density measurement)  Completed   Hepatitis C Screening  Completed   HPV Vaccine  Aged Out   Meningitis B Vaccine  Aged Out  *Topic was postponed. The date shown is not the original due date.

## 2024-08-23 ENCOUNTER — Ambulatory Visit (INDEPENDENT_AMBULATORY_CARE_PROVIDER_SITE_OTHER)

## 2024-08-23 DIAGNOSIS — Z0000 Encounter for general adult medical examination without abnormal findings: Secondary | ICD-10-CM

## 2024-08-23 LAB — CBC WITH DIFFERENTIAL/PLATELET
Basophils Absolute: 0 x10E3/uL (ref 0.0–0.2)
Basos: 1 %
EOS (ABSOLUTE): 0.1 x10E3/uL (ref 0.0–0.4)
Eos: 2 %
Hematocrit: 45.2 % (ref 34.0–46.6)
Hemoglobin: 14.1 g/dL (ref 11.1–15.9)
Immature Grans (Abs): 0 x10E3/uL (ref 0.0–0.1)
Immature Granulocytes: 0 %
Lymphocytes Absolute: 1.9 x10E3/uL (ref 0.7–3.1)
Lymphs: 34 %
MCH: 27.9 pg (ref 26.6–33.0)
MCHC: 31.2 g/dL — ABNORMAL LOW (ref 31.5–35.7)
MCV: 89 fL (ref 79–97)
Monocytes Absolute: 0.5 x10E3/uL (ref 0.1–0.9)
Monocytes: 9 %
Neutrophils Absolute: 3.1 x10E3/uL (ref 1.4–7.0)
Neutrophils: 54 %
Platelets: 289 x10E3/uL (ref 150–450)
RBC: 5.06 x10E6/uL (ref 3.77–5.28)
RDW: 13 % (ref 11.7–15.4)
WBC: 5.7 x10E3/uL (ref 3.4–10.8)

## 2024-08-23 LAB — LIPID PANEL
Chol/HDL Ratio: 2.1 ratio (ref 0.0–4.4)
Cholesterol, Total: 122 mg/dL (ref 100–199)
HDL: 59 mg/dL (ref >39)
LDL Chol Calc (NIH): 46 mg/dL (ref 0–99)
Triglycerides: 92 mg/dL (ref 0–149)
VLDL Cholesterol Cal: 17 mg/dL (ref 5–40)

## 2024-08-23 LAB — CMP14+EGFR
ALT: 14 IU/L (ref 0–32)
AST: 16 IU/L (ref 0–40)
Albumin: 4.1 g/dL (ref 3.9–4.9)
Alkaline Phosphatase: 91 IU/L (ref 49–135)
BUN/Creatinine Ratio: 17 (ref 12–28)
BUN: 13 mg/dL (ref 8–27)
Bilirubin Total: 0.5 mg/dL (ref 0.0–1.2)
CO2: 21 mmol/L (ref 20–29)
Calcium: 9.5 mg/dL (ref 8.7–10.3)
Chloride: 106 mmol/L (ref 96–106)
Creatinine, Ser: 0.77 mg/dL (ref 0.57–1.00)
Globulin, Total: 2 g/dL (ref 1.5–4.5)
Glucose: 128 mg/dL — ABNORMAL HIGH (ref 70–99)
Potassium: 4.6 mmol/L (ref 3.5–5.2)
Sodium: 143 mmol/L (ref 134–144)
Total Protein: 6.1 g/dL (ref 6.0–8.5)
eGFR: 84 mL/min/1.73 (ref >59)

## 2024-08-23 LAB — MICROALBUMIN / CREATININE URINE RATIO
Creatinine, Urine: 117.6 mg/dL
Microalb/Creat Ratio: <3 mg/g{creat} (ref 0–29)
Microalbumin, Urine: <3 ug/mL

## 2024-08-23 LAB — HEMOGLOBIN A1C
Est. average glucose Bld gHb Est-mCnc: 160 mg/dL
Hgb A1c MFr Bld: 7.2 % — ABNORMAL HIGH (ref 4.8–5.6)

## 2024-08-23 NOTE — Progress Notes (Signed)
 Subjective:   Pamela Roberson is a 67 y.o. female who presents for Medicare Annual (Subsequent) preventive examination.  Visit Complete: Virtual I connected with  Pamela Roberson on 08/23/24 by a audio enabled telemedicine application and verified that I am speaking with the correct person using two identifiers.  Patient Location: Home  Provider Location: Office/Clinic  I discussed the limitations of evaluation and management by telemedicine. The patient expressed understanding and agreed to proceed.  Vital Signs: Because this visit was a virtual/telehealth visit, some criteria may be missing or patient reported. Any vitals not documented were not able to be obtained and vitals that have been documented are patient reported.  Patient Medicare AWV questionnaire was completed by the patient on 08/23/2024; I have confirmed that all information answered by patient is correct and no changes since this date.        Objective:    There were no vitals filed for this visit. There is no height or weight on file to calculate BMI.     03/02/2024    8:57 AM 05/25/2023   10:44 AM 12/28/2022   12:32 PM 04/11/2022    1:30 AM 03/06/2022    8:13 PM 01/24/2022    5:14 PM 05/04/2021    9:34 PM  Advanced Directives  Does Patient Have a Medical Advance Directive? No Yes No No No No No  Type of Furniture conservator/restorer;Living will       Copy of Healthcare Power of Attorney in Chart?  No - copy requested       Would patient like information on creating a medical advance directive?    No - Patient declined No - Patient declined No - Patient declined No - Patient declined    Current Medications (verified) Outpatient Encounter Medications as of 08/23/2024  Medication Sig   Accu-Chek Softclix Lancets lancets Use to check blood sugar 3 (three) times daily (morning, noon, and bedtime)   acyclovir  (ZOVIRAX ) 400 MG tablet Take 1 tablet (400 mg total) by mouth 2 (two) times daily.    albuterol  (PROVENTIL ) (2.5 MG/3ML) 0.083% nebulizer solution Take 3 mLs (2.5 mg total) by nebulization every 4 (four) hours as needed for wheezing or shortness of breath.   albuterol  (VENTOLIN  HFA) 108 (90 Base) MCG/ACT inhaler Inhale 2 puffs into the lungs every 4 (four) hours as needed for wheezing or shortness of breath.   amLODipine  (NORVASC ) 5 MG tablet Take 1 tablet (5 mg total) by mouth daily.   Artificial Tear Ointment (DRY EYES OP) Place 1-2 drops into both eyes daily as needed (for dry eyes).  (Patient not taking: Reported on 08/22/2024)   ascorbic acid (VITAMIN C) 1000 MG tablet Take 1 tablet by mouth daily.   azelastine  (OPTIVAR ) 0.05 % ophthalmic solution Place 1 drop into both eyes 2 (two) times daily.   Blood Glucose Monitoring Suppl DEVI 1 each by Does not apply route in the morning, at noon, and at bedtime. May substitute to any manufacturer covered by patient's insurance.   budesonide -formoterol  (SYMBICORT ) 160-4.5 MCG/ACT inhaler Inhale 2 puffs into the lungs 2 (two) times daily with a spacer.   Carbinoxamine  Maleate 4 MG TABS Take 2 tablets (8 mg total) by mouth 2 (two) times daily.   cholecalciferol (VITAMIN D3) 25 MCG (1000 UNIT) tablet Take 2,000 Units by mouth daily.   clotrimazole  (LOTRIMIN ) 1 % cream Apply 1 Application topically to rash 2 (two) times daily.   Continuous Glucose Receiver (DEXCOM G7 RECEIVER)  DEVI 1 Device by Does not apply route continuous.   Continuous Glucose Sensor (DEXCOM G7 SENSOR) MISC Use to monitor blood sugar levels continuously, changing sensor every 10 days.   cycloSPORINE (RESTASIS) 0.05 % ophthalmic emulsion Place 1 drop into both eyes 2 times daily.   EPINEPHrine  0.3 mg/0.3 mL IJ SOAJ injection Inject 0.3 mg into the muscle as needed for anaphylaxis.   famotidine  (PEPCID ) 20 MG tablet Take 1 tablet (20 mg total) by mouth 1 to 2 times daily.   fluticasone  (FLONASE ) 50 MCG/ACT nasal spray Place 2 sprays into both nostrils daily.   glipiZIDE   (GLUCOTROL ) 5 MG tablet Take 1 tablet (5 mg total) by mouth daily before breakfast. Hold if blood sugar is less than 100 (Patient not taking: Reported on 08/08/2024)   insulin  glargine (LANTUS ) 100 UNIT/ML Solostar Pen Inject 10 Units into the skin daily. (Patient taking differently: Inject 14 Units into the skin daily.)   Insulin  Pen Needle (PEN NEEDLES) 32G X 4 MM MISC 1 Device by Does not apply route daily.   Lancets Misc. (ACCU-CHEK SOFTCLIX LANCET DEV) KIT Use to check blood sugar in the morning, at noon, and at bedtime.   lidocaine  (XYLOCAINE ) 2 % solution Rinse and gargle with 70ml's as directed in the mouth or throat every 4 hours as needed.   metFORMIN  (GLUCOPHAGE -XR) 500 MG 24 hr tablet Take 1 tablet (500 mg total) by mouth 2 (two) times daily with a meal.   metoprolol  succinate (TOPROL -XL) 25 MG 24 hr tablet Take 1 tablet (25 mg total) by mouth daily.   montelukast  (SINGULAIR ) 10 MG tablet Take 1 tablet (10 mg total) by mouth daily.   Multiple Vitamin (MULTIVITAMIN WITH MINERALS) TABS tablet Take 1 tablet by mouth daily.   nitroGLYCERIN  (NITROSTAT ) 0.4 MG SL tablet Place 1 tablet (0.4 mg total) under the tongue every 5 (five) minutes as needed for chest pain as directed.   nystatin  (MYCOSTATIN ) 100000 UNIT/ML suspension Take 5 mLs (500,000 Units total) by mouth 4 (four) times daily. Swish and spit   ondansetron  (ZOFRAN -ODT) 4 MG disintegrating tablet Take 1 tablet (4 mg total) by mouth every 8 (eight) hours as needed for nausea or vomiting.   pantoprazole  (PROTONIX ) 40 MG tablet Take 1 tablet (40 mg total) by mouth daily.   pravastatin  (PRAVACHOL ) 20 MG tablet Take 1 tablet (20 mg total) by mouth every evening.   predniSONE  (DELTASONE ) 20 MG tablet Take 2 tablets (40 mg total) by mouth daily.   [DISCONTINUED] esomeprazole  (NEXIUM ) 40 MG capsule Take 1 capsule (40 mg total) by mouth daily. (Patient not taking: Reported on 07/08/2020)   No facility-administered encounter medications on file as  of 08/23/2024.    Allergies (verified) Crab [shellfish allergy], Erythromycin, Hm lidocaine  patch [lidocaine ], Buprenorphine, Fish allergy, Metoclopramide, Doxycycline , Levaquin [levofloxacin in d5w], Morphine, and Sulfa antibiotics   History: Past Medical History:  Diagnosis Date   Arthritis    Asthma    Carotid stenosis, bilateral    Carpal tunnel syndrome    Chronic low back pain    COPD (chronic obstructive pulmonary disease) (HCC)    Diabetes mellitus without complication (HCC)    type II    Diabetic neuropathy (HCC)    Diverticulitis    Dyspnea    mild    Frequent falls    GERD (gastroesophageal reflux disease)    History of bronchitis    Hypercholesterolemia    Hypertension    IBS (irritable bowel syndrome)    Osteoarthritis  knee   Palpitations    Right ventricular enlargement    per pt report   Sleep apnea    mild but no sleep apnea    TMJ (dislocation of temporomandibular joint)    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    Vitamin D deficiency    Past Surgical History:  Procedure Laterality Date   ABDOMINAL HYSTERECTOMY  1993   CARPAL TUNNEL RELEASE Right    CERVICAL DISC SURGERY  04/29/2015   [place]   ESOPHAGEAL MANOMETRY N/A 03/03/2021   Procedure: ESOPHAGEAL MANOMETRY (EM);  Surgeon: Dianna Specking, MD;  Location: [facility];  Service: Endoscopy;  Laterality: N/A;   ESOPHAGOGASTRODUODENOSCOPY ENDOSCOPY     with esophagus being stretched twice per pt,    EYE SURGERY     fatty 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      feeding tube placement and removal   2015   NASAL SEPTUM SURGERY     ROTATOR CUFF REPAIR Right    spurs removed from esophagus   2015   TONSILLECTOMY     Family History  Problem Relation Age of Onset   Allergic rhinitis Mother    Diabetes Father        type 2   Heart disease Father    Hypertension Father    Allergic rhinitis Father    Allergic rhinitis Sister    Collagen disease Sister    Heart disease  Brother    Hypertension Brother    Angioedema Neg Hx    Asthma Neg Hx    Atopy Neg Hx    Eczema Neg Hx    Immunodeficiency Neg Hx    Urticaria Neg Hx    Social History   Socioeconomic History   Marital status: Widowed    Spouse name: Not on file   Number of children: 3   Years of education: Not on file   Highest education level: 11th grade  Occupational History    Comment: NA  Tobacco Use   Smoking status: Former    Current packs/day: 0.00    Average packs/day: 0.5 packs/day for 30.0 years (15.0 ttl pk-yrs)    Types: Cigarettes    Start date: 12/08/1964    Quit date: 12/08/1994    Years since quitting: 29.7   Smokeless tobacco: Never  Vaping Use   Vaping status: Never Used  Substance and Sexual Activity   Alcohol use: No   Drug use: No   Sexual activity: Not Currently  Other Topics Concern   Not on file  Social History Narrative   Lives with daughter   Caffeine- coffee 12 oz   Social Drivers of Corporate investment banker Strain: Low Risk  (08/21/2024)   Overall Financial Resource Strain (CARDIA)    Difficulty of Paying Living Expenses: Not very hard  Food Insecurity: No Food Insecurity (08/21/2024)   Hunger Vital Sign    Worried About Running Out of Food in the Last Year: Never true    Ran Out of Food in the Last Year: Never true  Transportation Needs: No Transportation Needs (08/21/2024)   PRAPARE - Administrator, Civil Service (Medical): No    Lack of Transportation (Non-Medical): No  Physical Activity: Insufficiently Active (08/21/2024)   Exercise Vital Sign    Days of Exercise per Week: 3 days    Minutes of Exercise per Session: 30 min  Stress: No Stress Concern Present (08/21/2024)   [employer] of Occupational Health -  Occupational Stress Questionnaire    Feeling of Stress: Not at all  Social Connections: Moderately Integrated (08/21/2024)   Social Connection and Isolation Panel    Frequency of Communication with Friends and Family: Three  times a week    Frequency of Social Gatherings with Friends and Family: Once a week    Attends Religious Services: More than 4 times per year    Active Member of [employer] or Organizations: Yes    Attends Banker Meetings: More than 4 times per year    Marital Status: Widowed    Tobacco Counseling Counseling given: Not Answered   Clinical Intake:                        Activities of Daily Living    08/22/2024    1:02 PM  In your present state of health, do you have any difficulty performing the following activities:  Hearing? 0  Vision? 1  Difficulty concentrating or making decisions? 0  Walking or climbing stairs? 1  Dressing or bathing? 1  Doing errands, shopping? 0  Preparing Food and eating ? N  Using the Toilet? N  In the past six months, have you accidently leaked urine? Y  Do you have problems with loss of bowel control? N  Managing your Medications? N  Managing your Finances? N  Housekeeping or managing your Housekeeping? Y    Patient Care Team: Georgina Speaks, FNP as PCP - General (General Practice) Tobb, Kardie, DO as PCP - Cardiology (Cardiology) [facility]  Indicate any recent Medical Services you may have received from other than [facility] providers in the past year (date may be approximate).     Assessment:   This is a routine wellness examination for Pamela Roberson.  Hearing/Vision screen No results found.   Goals Addressed   None    Depression Screen    08/22/2024   10:54 AM 08/21/2023   10:53 AM 05/25/2023   10:46 AM 04/06/2023   11:32 AM  PHQ 2/9 Scores  PHQ - 2 Score 0 0 0 0  PHQ- 9 Score 0 0  0    Fall Risk    08/22/2024    1:02 PM 08/22/2024   10:54 AM 08/21/2023   10:53 AM 05/25/2023   10:45 AM 04/06/2023   11:32 AM  Fall Risk   Falls in the past year? 1 0 0 0 0  Number falls in past yr: 0 0 0 0 0  Injury with Fall? 0 0 0 0 0  Risk for fall due to :  No Fall Risks No Fall Risks Medication side effect;Impaired  mobility;Impaired balance/gait No Fall Risks  Follow up  Falls evaluation completed Falls evaluation completed Falls prevention discussed;Education provided;Falls evaluation completed Falls evaluation completed    MEDICARE RISK AT HOME: Medicare Risk at Home Any stairs in or around the home?: (Patient-Rptd) Yes If so, are there any without handrails?: (Patient-Rptd) Yes Home free of loose throw rugs in walkways, pet beds, electrical cords, etc?: (Patient-Rptd) Yes Adequate lighting in your home to reduce risk of falls?: (Patient-Rptd) Yes Life alert?: (Patient-Rptd) No Use of a cane, walker or w/c?: (Patient-Rptd) Yes Grab bars in the bathroom?: (Patient-Rptd) No Shower chair or bench in shower?: (Patient-Rptd) No Elevated toilet seat or a handicapped toilet?: (Patient-Rptd) No  TIMED UP AND GO:  Was the test performed?  No    Cognitive Function:        05/25/2023  10:46 AM  6CIT Screen  What Year? 0 points  What month? 0 points  What time? 0 points  Count back from 20 4 points  Months in reverse 0 points  Repeat phrase 6 points  Total Score 10 points    Immunizations Immunization History  Administered Date(s) Administered    sv, Bivalent, Protein Subunit Rsvpref,pf [place]) 12/06/2023   Fluad Trivalent(High Dose 65+) 10/17/2023   Influenza,inj,Quad PF,6+ Mos 09/20/2019   Moderna Sars-Covid-2 Vaccination 12/17/2020, 01/28/2021   Pneumococcal Conjugate-13 09/20/2019   Pneumococcal Polysaccharide-23 03/30/2023   Tdap 05/30/2019    TDAP status: Up to date  Flu Vaccine status: Declined, Education has been provided regarding the importance of this vaccine but patient still declined. Advised may receive this vaccine at local pharmacy or Health Dept. Aware to provide a copy of the vaccination record if obtained from local pharmacy or Health Dept. Verbalized acceptance and understanding.  Pneumococcal vaccine status: Up to date  Covid-19 vaccine status: Information  provided on how to obtain vaccines.   Qualifies for Shingles Vaccine? Yes   Zostavax completed No   Shingrix Completed?: No.    Education has been provided regarding the importance of this vaccine. Patient has been advised to call insurance company to determine out of pocket expense if they have not yet received this vaccine. Advised may also receive vaccine at local pharmacy or Health Dept. Verbalized acceptance and understanding.  Screening Tests Health Maintenance  Topic Date Due   Medicare Annual Wellness (AWV)  05/24/2024   COVID-19 Vaccine (3 - Moderna risk series) 09/07/2024 (Originally 02/25/2021)   Zoster Vaccines- Shingrix (1 of 2) 11/21/2024 (Originally [birth year]1975)   Influenza Vaccine  03/04/2025 (Originally 07/05/2024)   Diabetic kidney evaluation - Urine ACR  01/14/2025   HEMOGLOBIN A1C  01/18/2025   FOOT EXAM  02/12/2025   OPHTHALMOLOGY EXAM  05/15/2025   Diabetic kidney evaluation - eGFR measurement  07/10/2025   Mammogram  05/14/2026   DTaP/Tdap/Td (2 - Td or Tdap) 05/29/2029   Colonoscopy  07/26/2033   Pneumococcal Vaccine: 50+ Years  Completed   DEXA SCAN  Completed   Hepatitis C Screening  Completed   HPV VACCINES  Aged Out   Meningococcal B Vaccine  Aged Out    Health Maintenance  Health Maintenance Due  Topic Date Due   Medicare Annual Wellness (AWV)  05/24/2024    Colorectal cancer screening: Type of screening: Colonoscopy. Completed 07/27/2023. Repeat every 10 years  Mammogram status: Completed 05/14/24. Repeat every year  Bone Density status: Completed 07/19/2023. Results reflect: Bone density results: NORMAL. Repeat every 5 years.  Lung Cancer Screening: (Low Dose CT Chest recommended if Age 32-80 years, 20 pack-year currently smoking OR have quit w/in 15years.) does not qualify.   Lung Cancer Screening Referral: n/a  Additional Screening:  Hepatitis C Screening: does qualify; Completed 04/06/23  Vision Screening: Recommended annual ophthalmology  exams for early detection of glaucoma and other disorders of the eye. Is the patient up to date with their annual eye exam?  Yes  Who is the provider or what is the name of the office in which the patient attends annual eye exams? [facility] If pt is not established with a provider, would they like to be referred to a provider to establish care? No .   Dental Screening: Recommended annual dental exams for proper oral hygiene  Diabetic Foot Exam: Diabetic Foot Exam: Completed 02/13/2024  Community Resource Referral / Chronic Care Management: CRR required this visit?  No  CCM required this visit?  No     Plan:     I have personally reviewed and noted the following in the patient's chart:   Medical and social history Use of alcohol, tobacco or illicit drugs  Current medications and supplements including opioid prescriptions. Patient is not currently taking opioid prescriptions. Functional ability and status Nutritional status Physical activity Advanced directives List of other physicians Hospitalizations, surgeries, and ER visits in previous 12 months Vitals Screenings to include cognitive, depression, and falls Referrals and appointments  In addition, I have reviewed and discussed with patient certain preventive protocols, quality metrics, and best practice recommendations. A written personalized care plan for preventive services as well as general preventive health recommendations were provided to patient.     Kristeen JINNY Lunger, CMA   08/23/2024   After Visit Summary: (MyChart) Due to this being a telephonic visit, the after visit summary with patients personalized plan was offered to patient via MyChart   Nurse Notes:

## 2024-08-27 ENCOUNTER — Other Ambulatory Visit (HOSPITAL_COMMUNITY): Payer: Self-pay

## 2024-08-27 ENCOUNTER — Other Ambulatory Visit: Payer: Self-pay

## 2024-08-30 ENCOUNTER — Ambulatory Visit: Admitting: "Endocrinology

## 2024-09-01 ENCOUNTER — Ambulatory Visit: Payer: Self-pay | Admitting: Nurse Practitioner

## 2024-09-01 DIAGNOSIS — R438 Other disturbances of smell and taste: Secondary | ICD-10-CM | POA: Insufficient documentation

## 2024-09-01 DIAGNOSIS — E66811 Obesity, class 1: Secondary | ICD-10-CM | POA: Insufficient documentation

## 2024-09-01 DIAGNOSIS — L700 Acne vulgaris: Secondary | ICD-10-CM | POA: Insufficient documentation

## 2024-09-01 NOTE — Assessment & Plan Note (Signed)
 Persistent blackhead not resolved with warm compresses. - Recommend over-the-counter blackhead removal solution. - Consider dermatologist referral if persistent.

## 2024-09-01 NOTE — Assessment & Plan Note (Signed)
 Blood pressure is controlled. Continue current medications.

## 2024-09-01 NOTE — Assessment & Plan Note (Signed)
 Persistent bad taste possibly due to recent antibiotics. No visible white patches. - Prescribe nystatin  solution, instruct to swish and spit.

## 2024-09-01 NOTE — Assessment & Plan Note (Signed)
 Blood sugar fluctuating with recent hypoglycemia after glipizide  initiation. Current regimen includes Lantus  and metformin . Blood sugar at 200. Emphasized diabetes control, especially pre-surgery. - Continue Lantus  14 units and metformin  500 mg twice daily. - Monitor blood sugar levels regularly. - Ensure diabetes is well-controlled before surgical interventions. - Continue f/u with Endocrinology

## 2024-09-01 NOTE — Assessment & Plan Note (Signed)
 Considering weight loss surgery. Previous adverse effects with Ozempic /Wegovy . Emphasized heart health and diabetes control pre-surgery. - Refer to [facility] for weight loss surgery evaluation. - Discuss attending [facility] for dietary support. - Consider [facility]

## 2024-09-01 NOTE — Assessment & Plan Note (Signed)

## 2024-09-03 DIAGNOSIS — M5416 Radiculopathy, lumbar region: Secondary | ICD-10-CM | POA: Diagnosis not present

## 2024-09-05 ENCOUNTER — Other Ambulatory Visit (HOSPITAL_COMMUNITY): Payer: Self-pay

## 2024-09-05 MED FILL — Famotidine Tab 20 MG: ORAL | 30 days supply | Qty: 60 | Fill #1 | Status: AC

## 2024-09-09 ENCOUNTER — Other Ambulatory Visit (HOSPITAL_COMMUNITY): Payer: Self-pay

## 2024-09-17 ENCOUNTER — Ambulatory Visit (INDEPENDENT_AMBULATORY_CARE_PROVIDER_SITE_OTHER): Payer: Self-pay

## 2024-09-17 VITALS — BP 137/83 | HR 76 | Resp 16 | Ht 66.0 in | Wt 200.0 lb

## 2024-09-17 DIAGNOSIS — Z23 Encounter for immunization: Secondary | ICD-10-CM

## 2024-09-17 NOTE — Progress Notes (Signed)
 Patient presents today for a flu vaccine. Patient received flu injection in her left deltoid. Patient tolerated injection well.

## 2024-09-26 ENCOUNTER — Other Ambulatory Visit (HOSPITAL_COMMUNITY): Payer: Self-pay

## 2024-09-26 ENCOUNTER — Other Ambulatory Visit: Payer: Self-pay | Admitting: "Endocrinology

## 2024-09-26 MED ORDER — METFORMIN HCL ER 500 MG PO TB24
500.0000 mg | ORAL_TABLET | Freq: Two times a day (BID) | ORAL | 2 refills | Status: AC
  Filled 2024-09-26: qty 120, 60d supply, fill #0
  Filled 2024-11-25: qty 120, 60d supply, fill #1

## 2024-09-26 NOTE — Telephone Encounter (Signed)
 Requested Prescriptions   Pending Prescriptions Disp Refills   metFORMIN (GLUCOPHAGE-XR) 500 MG 24 hr tablet 120 tablet 2    Sig: Take 1 tablet (500 mg total) by mouth 2 (two) times daily with a meal.

## 2024-09-30 ENCOUNTER — Ambulatory Visit: Admitting: "Endocrinology

## 2024-10-04 ENCOUNTER — Other Ambulatory Visit (HOSPITAL_COMMUNITY): Payer: Self-pay

## 2024-10-04 MED FILL — Famotidine Tab 20 MG: ORAL | 30 days supply | Qty: 60 | Fill #2 | Status: AC

## 2024-10-07 ENCOUNTER — Other Ambulatory Visit (HOSPITAL_COMMUNITY): Payer: Self-pay

## 2024-10-07 ENCOUNTER — Encounter (HOSPITAL_COMMUNITY): Payer: Self-pay | Admitting: Emergency Medicine

## 2024-10-07 ENCOUNTER — Ambulatory Visit (HOSPITAL_COMMUNITY)
Admission: EM | Admit: 2024-10-07 | Discharge: 2024-10-07 | Disposition: A | Discharge status: Home / Self Care | Attending: Family Medicine | Admitting: Family Medicine

## 2024-10-07 DIAGNOSIS — J320 Chronic maxillary sinusitis: Secondary | ICD-10-CM | POA: Diagnosis not present

## 2024-10-07 MED ORDER — PREDNISONE 20 MG PO TABS
40.0000 mg | ORAL_TABLET | Freq: Every day | ORAL | 0 refills | Status: AC
  Filled 2024-10-07: qty 10, 5d supply, fill #0

## 2024-10-07 MED ORDER — AMOXICILLIN-POT CLAVULANATE 875-125 MG PO TABS
1.0000 | ORAL_TABLET | Freq: Two times a day (BID) | ORAL | 0 refills | Status: DC
  Filled 2024-10-07: qty 14, 7d supply, fill #0

## 2024-10-07 NOTE — ED Triage Notes (Signed)
 Pt reports that since end of September had sinus pain, ear pain, mucous in throat and can't produce anything out of it. Using inhaler.

## 2024-10-07 NOTE — ED Provider Notes (Signed)
 [facility]    CSN: [id number] Arrival date & time: 10/07/24  9195      History   Chief Complaint Chief Complaint  Patient presents with   Sinus Problem    HPI Pamela Roberson is a 68 y.o. female  presents for evaluation of URI symptoms for 2 months. Patient reports associated symptoms of sinus pressure/pain, ear pain, throat congestion, wheezing and shortness of breath, fatigue. Denies N/V/D, fever, body aches. Patient does have a hx of asthma.  Has been using her inhaler with temporary improvement.  Patient is not an active smoker.   Reports sick contacts via granddaughter.  Pt has taken sinus medication OTC for symptoms.  Was seen at the end of August for same symptoms and placed on amoxicillin  with improvement.  States when she came off the antibiotic symptoms returned.  She does follow with ENT. Pt has no other concerns at this time.    Sinus Problem Associated symptoms include shortness of breath.    Past Medical History:  Diagnosis Date   Arthritis    Asthma    Carotid stenosis, bilateral    Carpal tunnel syndrome    Chronic low back pain    COPD (chronic obstructive pulmonary disease) (HCC)    Diabetes mellitus without complication (HCC)    type II    Diabetic neuropathy (HCC)    Diverticulitis    Dyspnea    mild    Frequent falls    GERD (gastroesophageal reflux disease)    History of bronchitis    Hypercholesterolemia    Hypertension    IBS (irritable bowel syndrome)    Osteoarthritis    knee   Palpitations    Right ventricular enlargement    per pt report   Sleep apnea    mild but no sleep apnea    TMJ (dislocation of temporomandibular joint)    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    Vitamin D deficiency     Patient Active Problem List   Diagnosis Date Noted   Class 1 obesity due to excess calories with body mass index (BMI) of 32.0 to 32.9 in adult 09/01/2024   Bad taste in mouth 09/01/2024   Blackhead 09/01/2024   Primary  hypertension 02/13/2024   Diabetes mellitus with coincident hypertension (HCC) 02/13/2024   Class 1 obesity due to excess calories with body mass index (BMI) of 33.0 to 33.9 in adult 02/13/2024   Chronic maxillary sinusitis 01/30/2024   Epistaxis 12/25/2023   Hypertrophy of nasal turbinates 12/25/2023   COVID-19 vaccination declined 10/24/2023   Need for influenza vaccination 10/24/2023   Class 1 obesity without serious comorbidity with body mass index (BMI) of 30.0 to 30.9 in adult 10/24/2023   Encounter for annual health examination 08/31/2023   Type 2 diabetes mellitus with diabetic neuropathy, without long-term current use of insulin  (HCC) 08/31/2023   Estrogen deficiency 08/31/2023   Herpes zoster vaccination declined 08/31/2023   Influenza vaccination declined 08/31/2023   Post-nasal drip 08/31/2023   Muscle weakness (generalized) 08/31/2023   Degeneration of lumbar intervertebral disc 07/26/2023   Diabetic peripheral neuropathy (HCC) 07/26/2023   Left leg weakness 07/26/2023   Lumbar radiculopathy 07/26/2023   Pain in left knee 02/15/2022   Sciatica, left side 02/15/2022   Pain in left shoulder 11/16/2021   Internal hemorrhoids 11/03/2021   Leg cramps 11/03/2021   Myalgia 11/03/2021   Adenomatous polyp of colon 12/04/2020   Anal fissure 12/04/2020   Chronic idiopathic  constipation 12/04/2020   Constipation 12/04/2020   Dysphagia 12/04/2020   Gastroesophageal reflux disease 12/04/2020   Rectal pain 12/04/2020   Right upper quadrant pain 12/04/2020   Dizzy 12/11/2017   Pharyngeal dysphagia 12/11/2017   Referred ear pain, bilateral 12/11/2017   Diabetes mellitus without complication (HCC) 06/29/2017   Family history of glaucoma 06/29/2017   Hyperopia of both eyes with astigmatism and presbyopia 06/29/2017   Keratoconjunctivitis sicca of both eyes not specified as Sjogren's 06/29/2017   Nuclear sclerotic cataract of both eyes 06/29/2017   Vitreous floater, bilateral  06/29/2017   Biceps tendinitis of right shoulder 04/05/2017   Impingement syndrome of right shoulder 04/05/2017   Arthritis of right acromioclavicular joint 04/05/2017   Partial nontraumatic tear of rotator cuff, right 04/05/2017    Past Surgical History:  Procedure Laterality Date   ABDOMINAL HYSTERECTOMY  1993   CARPAL TUNNEL RELEASE Right    CERVICAL DISC SURGERY  04/29/2015   Dr Erle [place], [place]   ESOPHAGEAL MANOMETRY N/A 03/03/2021   Procedure: ESOPHAGEAL MANOMETRY (EM);  Surgeon: Dianna Specking, MD;  Location: [facility];  Service: Endoscopy;  Laterality: N/A;   ESOPHAGOGASTRODUODENOSCOPY ENDOSCOPY     with esophagus being stretched twice per pt,    EYE SURGERY     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      Home Medications    Prior to Admission medications   Medication Sig Start Date End Date Taking? Authorizing Provider  amoxicillin -clavulanate (AUGMENTIN ) 875-125 MG tablet Take 1 tablet by mouth every 12 (twelve) hours. 10/07/24  Yes Neal Oshea, Jodi R, NP  predniSONE  (DELTASONE ) 20 MG tablet Take 2 tablets (40 mg total) by mouth daily with breakfast for 5 days. 10/07/24 10/12/24 Yes Tarig Zimmers, Jodi R, NP  Accu-Chek Softclix Lancets lancets Use to check blood sugar 3 (three) times daily (morning, noon, and bedtime) 03/14/24   Motwani, Obadiah, MD  acyclovir  (ZOVIRAX ) 400 MG tablet Take 1 tablet (400 mg total) by mouth 2 (two) times daily. 07/03/24   Georgina Speaks, FNP  albuterol  (PROVENTIL ) (2.5 MG/3ML) 0.083% nebulizer solution Take 3 mLs (2.5 mg total) by nebulization every 4 (four) hours as needed for wheezing or shortness of breath. 12/14/23   Jeneal Danita Macintosh, MD  albuterol  (VENTOLIN  HFA) 108 (90 Base) MCG/ACT inhaler Inhale 2 puffs into the lungs every 4 (four)  hours as needed for wheezing or shortness of breath. 12/14/23   Jeneal Danita Macintosh, MD  amLODipine  (NORVASC ) 5 MG tablet Take 1 tablet (5 mg total) by mouth daily. 10/10/23   Tobb, Kardie, DO  Artificial Tear Ointment (DRY EYES OP) Place 1-2 drops into both eyes daily as needed (for dry eyes).  Patient not taking: Reported on 08/23/2024    [provider]  ascorbic acid (VITAMIN C) 1000 MG tablet Take 1 tablet by mouth daily.    [provider]  azelastine  (OPTIVAR ) 0.05 % ophthalmic solution Place 1 drop into both eyes 2 (two) times daily. 04/18/24   Jeneal Danita Macintosh, MD  Blood Glucose Monitoring Suppl DEVI 1 each by Does not apply route in the morning, at noon, and at bedtime. May substitute to any manufacturer covered by patient's insurance. 03/14/24  Motwani, Komal, MD  budesonide -formoterol  (SYMBICORT ) 160-4.5 MCG/ACT inhaler Inhale 2 puffs into the lungs 2 (two) times daily with a spacer. 04/18/24   Jeneal Danita Macintosh, MD  Carbinoxamine  Maleate 4 MG TABS Take 2 tablets (8 mg total) by mouth 2 (two) times daily. 06/04/24   Jeneal Danita Macintosh, MD  cholecalciferol (VITAMIN D3) 25 MCG (1000 UNIT) tablet Take 2,000 Units by mouth daily.    [provider]  clotrimazole  (LOTRIMIN ) 1 % cream Apply 1 Application topically to rash 2 (two) times daily. 06/16/24   Rising, Asberry, PA-C  Continuous Glucose Receiver (DEXCOM G7 RECEIVER) DEVI 1 Device by Does not apply route continuous. 03/14/24   Motwani, Komal, MD  Continuous Glucose Sensor (DEXCOM G7 SENSOR) MISC Use to monitor blood sugar levels continuously, changing sensor every 10 days. 07/18/24   Motwani, Komal, MD  cycloSPORINE (RESTASIS) 0.05 % ophthalmic emulsion Place 1 drop into both eyes 2 times daily. 03/08/22   [provider]  EPINEPHrine  0.3 mg/0.3 mL IJ SOAJ injection Inject 0.3 mg into the muscle as needed for anaphylaxis. 12/14/23   Jeneal Danita Macintosh, MD  famotidine  (PEPCID ) 20 MG  tablet Take 1 tablet (20 mg total) by mouth 1 to 2 times daily. 04/09/24   Jeneal Danita Macintosh, MD  fluticasone  (FLONASE ) 50 MCG/ACT nasal spray Place 2 sprays into both nostrils daily. 12/25/23     glipiZIDE  (GLUCOTROL ) 5 MG tablet Take 1 tablet (5 mg total) by mouth daily before breakfast. Hold if blood sugar is less than 100 Patient not taking: Reported on 08/08/2024 07/18/24   Motwani, Komal, MD  insulin  glargine (LANTUS ) 100 UNIT/ML Solostar Pen Inject 10 Units into the skin daily. Patient taking differently: Inject 14 Units into the skin daily. 05/28/24   Motwani, Komal, MD  Insulin  Pen Needle (PEN NEEDLES) 32G X 4 MM MISC 1 Device by Does not apply route daily. 03/15/24   Motwani, Komal, MD  Lancets Misc. (ACCU-CHEK SOFTCLIX LANCET DEV) KIT Use to check blood sugar in the morning, at noon, and at bedtime. 03/14/24   Motwani, Komal, MD  lidocaine  (XYLOCAINE ) 2 % solution Rinse and gargle with 55ml's as directed in the mouth or throat every 4 hours as needed. 03/31/24   White, Elizabeth A, PA-C  metFORMIN  (GLUCOPHAGE -XR) 500 MG 24 hr tablet Take 1 tablet (500 mg total) by mouth 2 (two) times daily with a meal. 09/26/24   Motwani, Komal, MD  metoprolol  succinate (TOPROL -XL) 25 MG 24 hr tablet Take 1 tablet (25 mg total) by mouth daily. 10/10/23   Tobb, Kardie, DO  montelukast  (SINGULAIR ) 10 MG tablet Take 1 tablet (10 mg total) by mouth daily. 04/18/24   Jeneal Danita Macintosh, MD  Multiple Vitamin (MULTIVITAMIN WITH MINERALS) TABS tablet Take 1 tablet by mouth daily.    [provider]  nitroGLYCERIN  (NITROSTAT ) 0.4 MG SL tablet Place 1 tablet (0.4 mg total) under the tongue every 5 (five) minutes as needed for chest pain as directed. 07/10/24   Tobb, Kardie, DO  nystatin  (MYCOSTATIN ) 100000 UNIT/ML suspension Take 5 mLs (500,000 Units total) by mouth 4 (four) times daily. Swish and spit 08/22/24   Georgina Speaks, FNP  ondansetron  (ZOFRAN -ODT) 4 MG disintegrating tablet Take 1 tablet (4 mg  total) by mouth every 8 (eight) hours as needed for nausea or vomiting. 11/26/23   Teresa Almarie LABOR, PA-C  pantoprazole  (PROTONIX ) 40 MG tablet Take 1 tablet (40 mg total) by mouth daily. 07/10/24   Jeneal Danita Macintosh, MD  pravastatin  (PRAVACHOL )  20 MG tablet Take 1 tablet (20 mg total) by mouth every evening. 10/10/23 10/22/24  Tobb, Kardie, DO  esomeprazole  (NEXIUM ) 40 MG capsule Take 1 capsule (40 mg total) by mouth daily. Patient not taking: Reported on 07/08/2020 04/24/19 08/06/20  Armenta Canning, MD    Family History Family History  Problem Relation Age of Onset   Allergic rhinitis Mother    Diabetes Father        type 2   Heart disease Father    Hypertension Father    Allergic rhinitis Father    Allergic rhinitis Sister    Collagen disease Sister    Heart disease Brother    Hypertension Brother    Angioedema Neg Hx    Asthma Neg Hx    Atopy Neg Hx    Eczema Neg Hx    Immunodeficiency Neg Hx    Urticaria Neg Hx     Social History Social History   Tobacco Use   Smoking status: Former    Current packs/day: 0.00    Average packs/day: 0.5 packs/day for 30.0 years (15.0 ttl pk-yrs)    Types: Cigarettes    Start date: 12/08/1964    Quit date: 12/08/1994    Years since quitting: 29.8   Smokeless tobacco: Never  Vaping Use   Vaping status: Never Used  Substance Use Topics   Alcohol use: No   Drug use: No     Allergies   Crab [shellfish allergy], Erythromycin, Hm lidocaine  patch [lidocaine ], Buprenorphine, Fish allergy, Metoclopramide, Doxycycline , Levaquin [levofloxacin in d5w], Morphine, and Sulfa antibiotics   Review of Systems Review of Systems  Constitutional:  Positive for fatigue.  HENT:  Positive for congestion, ear pain, sinus pressure and sinus pain.   Respiratory:  Positive for shortness of breath and wheezing.      Physical Exam Triage Vital Signs ED Triage Vitals  Encounter Vitals Group     BP 10/07/24 0823 125/82     Girls Systolic BP  Percentile --      Girls Diastolic BP Percentile --      Boys Systolic BP Percentile --      Boys Diastolic BP Percentile --      Pulse Rate 10/07/24 0823 87     Resp 10/07/24 0823 17     Temp 10/07/24 0823 98.3 F (36.8 C)     Temp Source 10/07/24 0823 Oral     SpO2 10/07/24 0823 98 %     Weight --      Height --      Head Circumference --      Peak Flow --      Pain Score 10/07/24 0822 8     Pain Loc --      Pain Education --      Exclude from Growth Chart --    No data found.  Updated Vital Signs BP 125/82 (BP Location: Left Arm)   Pulse 87   Temp 98.3 F (36.8 C) (Oral)   Resp 17   SpO2 98%   Visual Acuity Right Eye Distance:   Left Eye Distance:   Bilateral Distance:    Right Eye Near:   Left Eye Near:    Bilateral Near:     Physical Exam Vitals and nursing note reviewed.  Constitutional:      General: She is not in acute distress.    Appearance: She is well-developed. She is not ill-appearing.  HENT:     Head: Normocephalic and atraumatic.  Right Ear: Tympanic membrane and ear canal normal.     Left Ear: Tympanic membrane and ear canal normal.     Nose: Congestion present.     Right Turbinates: Swollen and pale.     Left Turbinates: Swollen and pale.     Right Sinus: Maxillary sinus tenderness present. No frontal sinus tenderness.     Left Sinus: Maxillary sinus tenderness present. No frontal sinus tenderness.     Mouth/Throat:     Mouth: Mucous membranes are moist.     Pharynx: Oropharynx is clear. Uvula midline. No oropharyngeal exudate or posterior oropharyngeal erythema.     Tonsils: No tonsillar exudate or tonsillar abscesses.  Eyes:     Conjunctiva/sclera: Conjunctivae normal.     Pupils: Pupils are equal, round, and reactive to light.  Cardiovascular:     Rate and Rhythm: Normal rate and regular rhythm.     Heart sounds: Normal heart sounds.  Pulmonary:     Effort: Pulmonary effort is normal.     Breath sounds: Normal breath sounds. No  wheezing or rhonchi.  Musculoskeletal:     Cervical back: Normal range of motion and neck supple.  Lymphadenopathy:     Cervical: No cervical adenopathy.  Skin:    General: Skin is warm and dry.  Neurological:     General: No focal deficit present.     Mental Status: She is alert and oriented to person, place, and time.  Psychiatric:        Mood and Affect: Mood normal.        Behavior: Behavior normal.      UC Treatments / Results  Labs (all labs ordered are listed, but only abnormal results are displayed) Labs Reviewed - No data to display  EKG   Radiology No results found.  Procedures Procedures (including critical care time)  Medications Ordered in UC Medications - No data to display  Initial Impression / Assessment and Plan / UC Course  I have reviewed the triage vital signs and the nursing notes.  Pertinent labs & imaging results that were available during my care of the patient were reviewed by me and considered in my medical decision making (see chart for details).     Start Augmentin  and prednisone .  Patient to continue inhalers as needed.  Advise PCP or ENT follow-up if symptoms do not improve.  ER precautions reviewed. Final Clinical Impressions(s) / UC Diagnoses   Final diagnoses:  Chronic maxillary sinusitis     Discharge Instructions      Start Augmentin  twice daily for 7 days.  Take prednisone  daily for 5 days.  Continue your inhalers and allergy medicine as prescribed.  Nasal rinses as tolerated.  Follow-up with your PCP/ENT if your symptoms do not improve.  Please go to the ER for any worsening symptoms.  Hope you feel better soon!    ED Prescriptions     Medication Sig Dispense Auth. Provider   amoxicillin -clavulanate (AUGMENTIN ) 875-125 MG tablet Take 1 tablet by mouth every 12 (twelve) hours. 14 tablet Michaelann Gunnoe, Jodi R, NP   predniSONE  (DELTASONE ) 20 MG tablet Take 2 tablets (40 mg total) by mouth daily with breakfast for 5 days. 10 tablet  Jarmal Lewelling, Jodi R, NP      PDMP not reviewed this encounter.   Loreda Myla SAUNDERS, NP 10/07/24 [phone]

## 2024-10-07 NOTE — Discharge Instructions (Signed)
 Start Augmentin  twice daily for 7 days.  Take prednisone  daily for 5 days.  Continue your inhalers and allergy medicine as prescribed.  Nasal rinses as tolerated.  Follow-up with your PCP/ENT if your symptoms do not improve.  Please go to the ER for any worsening symptoms.  Hope you feel better soon!

## 2024-10-11 ENCOUNTER — Other Ambulatory Visit: Payer: Self-pay

## 2024-10-11 ENCOUNTER — Other Ambulatory Visit (HOSPITAL_COMMUNITY): Payer: Self-pay

## 2024-10-15 ENCOUNTER — Other Ambulatory Visit: Payer: Self-pay | Admitting: Cardiology

## 2024-10-15 DIAGNOSIS — E119 Type 2 diabetes mellitus without complications: Secondary | ICD-10-CM

## 2024-10-15 DIAGNOSIS — I10 Essential (primary) hypertension: Secondary | ICD-10-CM

## 2024-10-15 DIAGNOSIS — J45909 Unspecified asthma, uncomplicated: Secondary | ICD-10-CM

## 2024-10-15 DIAGNOSIS — E1140 Type 2 diabetes mellitus with diabetic neuropathy, unspecified: Secondary | ICD-10-CM

## 2024-10-15 DIAGNOSIS — R0609 Other forms of dyspnea: Secondary | ICD-10-CM

## 2024-10-16 ENCOUNTER — Other Ambulatory Visit: Payer: Self-pay

## 2024-10-16 ENCOUNTER — Other Ambulatory Visit (HOSPITAL_COMMUNITY): Payer: Self-pay

## 2024-10-16 ENCOUNTER — Ambulatory Visit (INDEPENDENT_AMBULATORY_CARE_PROVIDER_SITE_OTHER): Admitting: Allergy

## 2024-10-16 ENCOUNTER — Encounter: Payer: Self-pay | Admitting: Allergy

## 2024-10-16 VITALS — BP 120/88 | HR 98 | Temp 98.5°F | Resp 19

## 2024-10-16 DIAGNOSIS — L209 Atopic dermatitis, unspecified: Secondary | ICD-10-CM

## 2024-10-16 DIAGNOSIS — L304 Erythema intertrigo: Secondary | ICD-10-CM | POA: Diagnosis not present

## 2024-10-16 DIAGNOSIS — J4540 Moderate persistent asthma, uncomplicated: Secondary | ICD-10-CM | POA: Diagnosis not present

## 2024-10-16 DIAGNOSIS — T7800XD Anaphylactic reaction due to unspecified food, subsequent encounter: Secondary | ICD-10-CM

## 2024-10-16 DIAGNOSIS — T50905D Adverse effect of unspecified drugs, medicaments and biological substances, subsequent encounter: Secondary | ICD-10-CM

## 2024-10-16 DIAGNOSIS — J3089 Other allergic rhinitis: Secondary | ICD-10-CM | POA: Diagnosis not present

## 2024-10-16 DIAGNOSIS — K2100 Gastro-esophageal reflux disease with esophagitis, without bleeding: Secondary | ICD-10-CM

## 2024-10-16 DIAGNOSIS — Z888 Allergy status to other drugs, medicaments and biological substances status: Secondary | ICD-10-CM

## 2024-10-16 MED ORDER — CLOTRIMAZOLE 1 % EX CREA
1.0000 | TOPICAL_CREAM | Freq: Two times a day (BID) | CUTANEOUS | 0 refills | Status: AC
  Filled 2024-10-16: qty 28, 30d supply, fill #0

## 2024-10-16 MED ORDER — TRIAMCINOLONE ACETONIDE 0.1 % EX OINT
1.0000 | TOPICAL_OINTMENT | Freq: Two times a day (BID) | CUTANEOUS | 1 refills | Status: AC | PRN
  Filled 2024-10-16: qty 15, 30d supply, fill #0

## 2024-10-16 MED ORDER — BUDESONIDE 1 MG/2ML IN SUSP
1.0000 mg | Freq: Every day | RESPIRATORY_TRACT | 2 refills | Status: DC
  Filled 2024-10-16: qty 60, 30d supply, fill #0

## 2024-10-16 MED ORDER — BUDESONIDE 0.5 MG/2ML IN SUSP
RESPIRATORY_TRACT | 1 refills | Status: AC
  Filled 2024-10-16: qty 120, 30d supply, fill #0
  Filled 2024-11-11 – 2024-12-13 (×3): qty 120, 30d supply, fill #1

## 2024-10-16 NOTE — Progress Notes (Signed)
 Follow-up Note  RE: Pamela Roberson MRN: [id number] DOB: 03/29/1956 Date of Office Visit: 10/16/2024   History of present illness: Pamela Roberson is a 68 y.o. female presenting today for follow-up of allergic rhinitis with conjunctivitis, asthma, food allergy, GERD and adverse medication effects.  She was last seen in the office on 04/18/2024 by myself.  Discussed the use of AI scribe software for clinical note transcription with the patient, who gave verbal consent to proceed.  History of Present Illness   Pamela Roberson is a 68 year old female with chronic sinus problems who presents with persistent sinus congestion and mucus production.  She has been experiencing ongoing sinus congestion and mucus production since August. Despite visiting urgent care for asthma-related issues and being prescribed antibiotics, there was no significant improvement. She was subsequently treated with Augmentin  and steroids for a sinus infection about a week ago and is still completing the course of Augmentin .  She describes difficulty in expectorating mucus, experiencing dry coughing, and feeling as though mucus is 'sitting in her chest' and face. She has been using Flonase  and carboxylamine, taking two tablets at a time, but continues to suffer from symptoms, particularly during certain times of the year.  She has a history of trying various nasal sprays, including Atrovent  and Astelin , which were not well tolerated due to burning sensations and were ineffective. She lacks the necessary equipment for saline washes.  She mentions a history of sinus surgery years ago, which did not alleviate her symptoms.  She also reports skin issues, including irritation under the breast and eczema on the elbow, for which she has used topical treatments in the past.       Review of systems: 10pt ROS negative unless noted above in HPI  Past medical/social/surgical/family history have been reviewed and  are unchanged unless specifically indicated below.  No changes  Medication List: Current Outpatient Medications  Medication Sig Dispense Refill   Accu-Chek Softclix Lancets lancets Use to check blood sugar 3 (three) times daily (morning, noon, and bedtime) 100 each 3   acyclovir  (ZOVIRAX ) 400 MG tablet Take 1 tablet (400 mg total) by mouth 2 (two) times daily. 30 tablet 4   albuterol  (PROVENTIL ) (2.5 MG/3ML) 0.083% nebulizer solution Take 3 mLs (2.5 mg total) by nebulization every 4 (four) hours as needed for wheezing or shortness of breath. 75 mL 1   albuterol  (VENTOLIN  HFA) 108 (90 Base) MCG/ACT inhaler Inhale 2 puffs into the lungs every 4 (four) hours as needed for wheezing or shortness of breath. 18 g 1   amLODipine  (NORVASC ) 5 MG tablet Take 1 tablet (5 mg total) by mouth daily. 90 tablet 3   amoxicillin -clavulanate (AUGMENTIN ) 875-125 MG tablet Take 1 tablet by mouth every 12 (twelve) hours. 14 tablet 0   Artificial Tear Ointment (DRY EYES OP) Place 1-2 drops into both eyes daily as needed (for dry eyes).      ascorbic acid (VITAMIN C) 1000 MG tablet Take 1 tablet by mouth daily.     azelastine  (OPTIVAR ) 0.05 % ophthalmic solution Place 1 drop into both eyes 2 (two) times daily. 6 mL 5   Blood Glucose Monitoring Suppl DEVI 1 each by Does not apply route in the morning, at noon, and at bedtime. May substitute to any manufacturer covered by patient's insurance. 1 each 0   budesonide  (PULMICORT ) 1 MG/2ML nebulizer solution Take 2 mLs (1 mg total) by nebulization daily. 60 mL 2   budesonide -formoterol  (SYMBICORT ) 160-4.5 MCG/ACT  inhaler Inhale 2 puffs into the lungs 2 (two) times daily with a spacer. 10.2 g 5   Carbinoxamine  Maleate 4 MG TABS Take 2 tablets (8 mg total) by mouth 2 (two) times daily. 120 tablet 5   cholecalciferol (VITAMIN D3) 25 MCG (1000 UNIT) tablet Take 2,000 Units by mouth daily.     clotrimazole  (LOTRIMIN ) 1 % cream Apply 1 Application topically to rash 2 (two) times  daily. 30 g 0   clotrimazole  (LOTRIMIN ) 1 % cream Apply 1 Application topically 2 (two) times daily for 14 days. 28 g 0   Continuous Glucose Receiver (DEXCOM G7 RECEIVER) DEVI 1 Device by Does not apply route continuous. 1 each 0   Continuous Glucose Sensor (DEXCOM G7 SENSOR) MISC Use to monitor blood sugar levels continuously, changing sensor every 10 days. 9 each 0   cycloSPORINE (RESTASIS) 0.05 % ophthalmic emulsion Place 1 drop into both eyes 2 times daily.     EPINEPHrine  0.3 mg/0.3 mL IJ SOAJ injection Inject 0.3 mg into the muscle as needed for anaphylaxis. 1 each 1   famotidine  (PEPCID ) 20 MG tablet Take 1 tablet (20 mg total) by mouth 1 to 2 times daily. 60 tablet 5   fluticasone  (FLONASE ) 50 MCG/ACT nasal spray Place 2 sprays into both nostrils daily. 16 g 9   insulin  glargine (LANTUS ) 100 UNIT/ML Solostar Pen Inject 10 Units into the skin daily. (Patient taking differently: Inject 14 Units into the skin daily.) 9 mL 1   Insulin  Pen Needle (PEN NEEDLES) 32G X 4 MM MISC 1 Device by Does not apply route daily. 200 each 2   Lancets Misc. (ACCU-CHEK SOFTCLIX LANCET DEV) KIT Use to check blood sugar in the morning, at noon, and at bedtime. 1 kit 0   metFORMIN  (GLUCOPHAGE -XR) 500 MG 24 hr tablet Take 1 tablet (500 mg total) by mouth 2 (two) times daily with a meal. 120 tablet 2   metoprolol  succinate (TOPROL -XL) 25 MG 24 hr tablet Take 1 tablet (25 mg total) by mouth daily. 90 tablet 3   montelukast  (SINGULAIR ) 10 MG tablet Take 1 tablet (10 mg total) by mouth daily. 30 tablet 5   Multiple Vitamin (MULTIVITAMIN WITH MINERALS) TABS tablet Take 1 tablet by mouth daily.     nitroGLYCERIN  (NITROSTAT ) 0.4 MG SL tablet Place 1 tablet (0.4 mg total) under the tongue every 5 (five) minutes as needed for chest pain as directed. 25 tablet 3   ondansetron  (ZOFRAN -ODT) 4 MG disintegrating tablet Take 1 tablet (4 mg total) by mouth every 8 (eight) hours as needed for nausea or vomiting. 20 tablet 0    pantoprazole  (PROTONIX ) 40 MG tablet Take 1 tablet (40 mg total) by mouth daily. 30 tablet 3   pravastatin  (PRAVACHOL ) 20 MG tablet Take 1 tablet (20 mg total) by mouth every evening. 90 tablet 3   triamcinolone  ointment (KENALOG ) 0.1 % Apply 1 Application topically 2 (two) times daily as needed for rash/itch. 30 g 1   glipiZIDE  (GLUCOTROL ) 5 MG tablet Take 1 tablet (5 mg total) by mouth daily before breakfast. Hold if blood sugar is less than 100 (Patient not taking: Reported on 08/08/2024) 90 tablet 1   lidocaine  (XYLOCAINE ) 2 % solution Rinse and gargle with 62ml's as directed in the mouth or throat every 4 hours as needed. 100 mL 1   nystatin  (MYCOSTATIN ) 100000 UNIT/ML suspension Take 5 mLs (500,000 Units total) by mouth 4 (four) times daily. Swish and spit 60 mL 0   No current  facility-administered medications for this visit.     Known medication allergies: Allergies  Allergen Reactions   Crab [Shellfish Allergy] Shortness Of Breath and Swelling   Erythromycin Swelling and Rash   Hm Lidocaine  Patch [Lidocaine ]     Burning on skin   Buprenorphine Dermatitis    buprenorphine hydrochloride   Fish Allergy     Swelling    Metoclopramide Other (See Comments)    Slurred speech and unable to move muscles,  Caused her to drag her feet   Doxycycline      GI Intolerance   Levaquin [Levofloxacin In D5w] Other (See Comments)    Causes irregular heart beat   Morphine Nausea And Vomiting and Rash    Irregular heart beat   Sulfa Antibiotics Rash    itching     Physical examination: Blood pressure 120/88, pulse 98, temperature 98.5 F (36.9 C), resp. rate 19, SpO2 97%.  General: Alert, interactive, in no acute distress. HEENT: PERRLA, TMs pearly gray, turbinates mildly edematous without discharge, post-pharynx non erythematous. Neck: Supple without lymphadenopathy. Lungs: Clear to auscultation without wheezing, rhonchi or rales. {no increased work of breathing. CV: Normal S1, S2 without  murmurs. Abdomen: Nondistended, nontender. Skin: Warm and dry, without lesions or rashes. Extremities:  No clubbing, cyanosis or edema. Neuro:   Grossly intact.  Diagnostics/Labs:  Spirometry: FEV1: 1.76L 84%, FVC: 2.19L 82%, ratio consistent with nonobstructive pattern  Assessment and plan: Allergic rhinitis with conjunctivitis                                - continue avoidance measures for grass pollen and tobacco leaf - use Carbinoxamine  4mg  - take 8mg  (2 tabs) twice daily - use Optivar  1 drop each eye up to 2 times daily as needed for itchy/watery eyes - for next month stop Flonase  and perform nasal steroid wash as below 1-2 times a day.  Wash kit provided.  Will send prescription for Budesonide  vials to add to wash to better treat sinuses.   Use either distilled water or boil water and let cool to room temperature prior to use.  Keep mouth open during the wash.   Asthma, moderate persistent  - continue Singulair  10mg  daily at bedtime - continue Symbicort  2 puffs twice a day with spacer device.  - have access to albuterol  inhaler 2 puffs or albuterol  1 vial via nebulizer every 4-6 hours as needed for cough/wheeze/shortness of breath/chest tightness.  May use 15-20 minutes prior to activity.   Monitor frequency of use.    Food allergy  - food allergy testing both skin and blood work is negative for fish and shellfish - have access to self-injectable epinephrine  Epipen  0.3mg  at all times - follow emergency action plan in case of allergic reaction  GERD  - continue lifestyle modifications for reflux control (like propping up head of bed and minimizing foods that cause heartburn) - continue pantoprazole  twice a day  - continue Pepcid  20 mg 1-2 times a day  Adverse medication effect  - continue your current medication avoidance  Intertrigo  - for rash under breast use clotrimazole  cream 1% cream twice daily for 2 weeks  Eczema  - for rash on elbow (eczema patch)  use triamcinolone  0.1% ointment twice a day as needed for rash/itch  Follow-up in 6 months or sooner if needed  I appreciate the opportunity to take part in Harrison's care. Please do not hesitate to contact me  with questions.  Sincerely,   Danita Brain, MD Allergy/Immunology [facility]

## 2024-10-16 NOTE — Patient Instructions (Addendum)
-   continue avoidance measures for grass pollen and tobacco leaf - use Carbinoxamine  4mg  - take 8mg  (2 tabs) twice daily - use Optivar  1 drop each eye up to 2 times daily as needed for itchy/watery eyes - for next month stop Flonase  and perform nasal steroid wash as below 1-2 times a day.  Wash kit provided.  Will send prescription for Budesonide  vials to add to wash to better treat sinuses.   Use either distilled water or boil water and let cool to room temperature prior to use.  Keep mouth open during the wash.   - continue Singulair  10mg  daily at bedtime - continue Symbicort  160mcg 2 puffs twice a day with spacer device.  - have access to albuterol  inhaler 2 puffs or albuterol  1 vial via nebulizer every 4-6 hours as needed for cough/wheeze/shortness of breath/chest tightness.  May use 15-20 minutes prior to activity.   Monitor frequency of use.    - food allergy testing both skin and blood work is negative for fish and shellfish - have access to self-injectable epinephrine  Epipen  0.3mg  at all times - follow emergency action plan in case of allergic reaction  - continue lifestyle modifications for reflux control (like propping up head of bed and minimizing foods that cause heartburn) - continue pantoprazole  twice a day  - continue Pepcid  20 mg 1-2 times a day  - continue your current medication avoidance  - for rash under breast use clotrimazole  cream 1% cream twice daily for 2 weeks  - for rash on elbow (eczema patch) use triamcinolone  0.1% ointment twice a day as needed for rash/itch  Follow-up in 6 months or sooner if needed    Budesonide  (Pulmicort ) + Saline Irrigation/Rinse  Budesonide  (Pulmicort ) is an anti-inflammatory steroid medication used to decrease nasal and sinus inflammation. It is dispensed in liquid form in a vial. Although it is manufactured for use with a nebulizer, we intend for you to use it with the NeilMed Sinus  Rinse bottle (preferred) or a Neti pot.   Instructions:  1) Make 240cc of saline in the NeilMed bottle using the salt packets or your own saline recipe (see separate handout).  2) Add the entire 2cc vial of liquid Budesonide  (Pulmicort ) to the rinse bottle and mix together.  3) While in the shower or over the sink, tilt your head forward to a comfortable level. Put the tip of the sinus rinse bottle in your nostril and aim it towards the crown or top of your head. Gently squeeze the bottle to flush out your nose. The fluid will circulate in and out of your sinus cavities, coming back out from either nostril or through your mouth. Try not to swallow large quantities and spit it out instead.  4) Perform Budesonide  (Pulmicort ) + Saline irrigations 2 times daily.

## 2024-10-21 ENCOUNTER — Other Ambulatory Visit: Payer: Self-pay

## 2024-10-22 ENCOUNTER — Other Ambulatory Visit (HOSPITAL_COMMUNITY): Payer: Self-pay

## 2024-10-23 ENCOUNTER — Encounter: Payer: Self-pay | Admitting: Podiatry

## 2024-10-23 ENCOUNTER — Other Ambulatory Visit (HOSPITAL_COMMUNITY): Payer: Self-pay

## 2024-10-23 ENCOUNTER — Ambulatory Visit (INDEPENDENT_AMBULATORY_CARE_PROVIDER_SITE_OTHER): Admitting: Podiatry

## 2024-10-23 ENCOUNTER — Other Ambulatory Visit: Payer: Self-pay | Admitting: Nurse Practitioner

## 2024-10-23 DIAGNOSIS — M79675 Pain in left toe(s): Secondary | ICD-10-CM

## 2024-10-23 DIAGNOSIS — J45909 Unspecified asthma, uncomplicated: Secondary | ICD-10-CM

## 2024-10-23 DIAGNOSIS — E119 Type 2 diabetes mellitus without complications: Secondary | ICD-10-CM

## 2024-10-23 DIAGNOSIS — E1142 Type 2 diabetes mellitus with diabetic polyneuropathy: Secondary | ICD-10-CM | POA: Diagnosis not present

## 2024-10-23 DIAGNOSIS — M79674 Pain in right toe(s): Secondary | ICD-10-CM | POA: Diagnosis not present

## 2024-10-23 DIAGNOSIS — L84 Corns and callosities: Secondary | ICD-10-CM | POA: Diagnosis not present

## 2024-10-23 DIAGNOSIS — B351 Tinea unguium: Secondary | ICD-10-CM | POA: Diagnosis not present

## 2024-10-23 DIAGNOSIS — E1140 Type 2 diabetes mellitus with diabetic neuropathy, unspecified: Secondary | ICD-10-CM

## 2024-10-23 DIAGNOSIS — I10 Essential (primary) hypertension: Secondary | ICD-10-CM

## 2024-10-23 DIAGNOSIS — R0609 Other forms of dyspnea: Secondary | ICD-10-CM

## 2024-10-24 ENCOUNTER — Other Ambulatory Visit (HOSPITAL_COMMUNITY): Payer: Self-pay

## 2024-10-24 ENCOUNTER — Other Ambulatory Visit: Payer: Self-pay | Admitting: Cardiology

## 2024-10-24 DIAGNOSIS — I10 Essential (primary) hypertension: Secondary | ICD-10-CM

## 2024-10-24 DIAGNOSIS — E1140 Type 2 diabetes mellitus with diabetic neuropathy, unspecified: Secondary | ICD-10-CM

## 2024-10-24 DIAGNOSIS — J45909 Unspecified asthma, uncomplicated: Secondary | ICD-10-CM

## 2024-10-24 DIAGNOSIS — E119 Type 2 diabetes mellitus without complications: Secondary | ICD-10-CM

## 2024-10-24 DIAGNOSIS — R0609 Other forms of dyspnea: Secondary | ICD-10-CM

## 2024-10-24 MED ORDER — AMLODIPINE BESYLATE 5 MG PO TABS
5.0000 mg | ORAL_TABLET | Freq: Every day | ORAL | 3 refills | Status: AC
  Filled 2024-10-24: qty 90, 90d supply, fill #0

## 2024-10-24 MED ORDER — PRAVASTATIN SODIUM 20 MG PO TABS
20.0000 mg | ORAL_TABLET | Freq: Every evening | ORAL | 3 refills | Status: AC
  Filled 2024-10-24: qty 90, 90d supply, fill #0

## 2024-10-24 MED ORDER — METOPROLOL SUCCINATE ER 25 MG PO TB24
25.0000 mg | ORAL_TABLET | Freq: Every day | ORAL | 3 refills | Status: AC
  Filled 2024-10-24: qty 90, 90d supply, fill #0

## 2024-10-25 ENCOUNTER — Other Ambulatory Visit (HOSPITAL_COMMUNITY): Payer: Self-pay

## 2024-10-28 ENCOUNTER — Telehealth: Payer: Self-pay | Admitting: Pharmacist

## 2024-10-28 DIAGNOSIS — E1140 Type 2 diabetes mellitus with diabetic neuropathy, unspecified: Secondary | ICD-10-CM

## 2024-10-28 NOTE — Progress Notes (Unsigned)
   10/28/2024  Patient ID: Pamela Roberson, female   DOB: 1956/09/29, 68 y.o.   MRN: [id number]    10/28/2024 Name: Pamela Roberson MRN: [id number] DOB: 1956/09/21  Chief Complaint  Patient presents with   Medication Management    Diabetes     {Visit Type:26650}   Subjective:  Care Team: Primary Care Provider: Georgina Speaks, FNP ; Next Scheduled Visit: *** {careteamprovider:27366}  Medication Access/Adherence  Current Pharmacy:  [place] [facility] [address] [place] [address] Phone: [phone] Fax: [phone]  [id number] - [place], [place] - [address] AT [facility] OF [address] & [address] [place] [address] Phone: [phone] Fax: [phone]   Patient reports affordability concerns with their medications: {YES/NO:21197} Patient reports access/transportation concerns to their pharmacy: {YES/NO:21197} Patient reports adherence concerns with their medications:  {YES/NO:21197} ***   {Pharmacy S/O Choices:26420}   Objective:  Lab Results  Component Value Date   HGBA1C 7.2 (H) 08/22/2024    Lab Results  Component Value Date   CREATININE 0.77 08/22/2024   BUN 13 08/22/2024   NA 143 08/22/2024   K 4.6 08/22/2024   CL 106 08/22/2024   CO2 21 08/22/2024    Lab Results  Component Value Date   CHOL 122 08/22/2024   HDL 59 08/22/2024   LDLCALC 46 08/22/2024   TRIG 92 08/22/2024   CHOLHDL 2.1 08/22/2024    Medications Reviewed Today   Medications were not reviewed in this encounter       Assessment/Plan:   {Pharmacy A/P Choices:26421}  Follow Up Plan: ***  ***

## 2024-10-29 ENCOUNTER — Encounter: Payer: Self-pay | Admitting: Pharmacist

## 2024-11-01 ENCOUNTER — Encounter: Payer: Self-pay | Admitting: Podiatry

## 2024-11-01 NOTE — Progress Notes (Signed)
 Subjective:  Patient ID: Pamela Roberson, female    DOB: 03/04/1956,  MRN: [id number]  Pamela Roberson presents to clinic today for at risk foot care with history of diabetic neuropathy and callus(es) b/l feet and painful mycotic toenails that are difficult to trim. Painful toenails interfere with ambulation. Aggravating factors include wearing enclosed shoe gear. Pain is relieved with periodic professional debridement. Painful calluses are aggravated when weightbearing with and without shoegear. Pain is relieved with periodic professional debridement.  Chief Complaint  Patient presents with   Diabetes    DFC IDDM A1C 7.2. Toenail trim and callus care. LOV with PCP 08/22/24.   New problem(s): None.   PCP is Georgina Speaks, FNP.  Allergies  Allergen Reactions   Crab [Shellfish Allergy] Shortness Of Breath and Swelling   Erythromycin Swelling and Rash   Hm Lidocaine  Patch [Lidocaine ]     Burning on skin   Buprenorphine Dermatitis    buprenorphine hydrochloride   Fish Allergy     Swelling    Metoclopramide Other (See Comments)    Slurred speech and unable to move muscles,  Caused her to drag her feet   Doxycycline      GI Intolerance   Levaquin [Levofloxacin In D5w] Other (See Comments)    Causes irregular heart beat   Morphine Nausea And Vomiting and Rash    Irregular heart beat   Sulfa Antibiotics Rash    itching    Review of Systems: Negative except as noted in the HPI.  Objective: No changes noted in today's physical examination. There were no vitals filed for this visit. Pamela Roberson is a pleasant 68 y.o. female in NAD. AAO x 3.  Vascular Examination: Capillary refill time immediate b/l.DP pulses 2/4 b/l. PT pulses 1/4 b/l. Pedal hair absent b/l. No edema. No pain with calf compression b/l. Skin temperature gradient WNL b/l. No cyanosis or clubbing noted b/l. No ischemia or gangrene b/l.   Neurological Examination:  Pt has subjective symptoms of  neuropathy. Protective sensation decreased with 10 gram monofilament b/l.  Dermatological Examination: Pedal skin with normal turgor, texture and tone b/l.  No open wounds. No interdigital macerations.   Toenails 1-5 b/l thick, discolored, elongated with subungual debris and pain on dorsal palpation.   Hyperkeratotic lesion(s) submet head 1 left foot, submet head 5 left foot, and submet head 5 right foot.  No erythema, no edema, no drainage, no fluctuance.  Musculoskeletal Examination: Muscle strength 5/5 to all lower extremity muscle groups bilaterally. 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  Radiographs: None  Assessment/Plan: 1. Pain due to onychomycosis of toenails of both feet   2. Callus   3. Diabetic peripheral neuropathy associated with type 2 diabetes mellitus [facility])   Consent given for treatment. Patient examined.All patient's and/or POA's questions/concerns addressed on today's visit. Toenails 1-5 b/l debrided in length and girth without incident. Callus(es) submet head 1 left foot, submet head 5 left foot, submet head 5 right foot, and sub 5th met base right lower extremity pared with sharp debridement without incident. Continue foot and shoe inspections daily. Monitor blood glucose per PCP/Endocrinologist's recommendations.Continue soft, supportive shoe gear daily. Report any pedal injuries to medical professional. Call office if there are any questions/concerns. Return in about 3 months (around 01/23/2025).  Delon LITTIE Merlin, DPM      [place] LOCATION: [address]  [place], [place] [address]                   Office [phone]   [facility] LOCATION: [address] [place], [place] [address] Office [phone]

## 2024-11-04 ENCOUNTER — Other Ambulatory Visit (HOSPITAL_COMMUNITY): Payer: Self-pay

## 2024-11-05 ENCOUNTER — Ambulatory Visit: Admitting: "Endocrinology

## 2024-11-07 ENCOUNTER — Other Ambulatory Visit (HOSPITAL_COMMUNITY): Payer: Self-pay

## 2024-11-07 MED ORDER — ONDANSETRON 4 MG PO TBDP
4.0000 mg | ORAL_TABLET | Freq: Every day | ORAL | 5 refills | Status: AC | PRN
  Filled 2024-11-07: qty 30, 30d supply, fill #0
  Filled 2024-12-26: qty 30, 30d supply, fill #1

## 2024-11-07 MED ORDER — SUCRALFATE 1 G PO TABS
1.0000 g | ORAL_TABLET | Freq: Two times a day (BID) | ORAL | 3 refills | Status: AC
  Filled 2024-11-07: qty 60, 30d supply, fill #0
  Filled 2024-12-04: qty 60, 30d supply, fill #1
  Filled 2025-01-03: qty 60, 30d supply, fill #2

## 2024-11-07 MED ORDER — PANTOPRAZOLE SODIUM 40 MG PO TBEC
40.0000 mg | DELAYED_RELEASE_TABLET | Freq: Two times a day (BID) | ORAL | 5 refills | Status: AC
  Filled 2024-11-07: qty 60, 30d supply, fill #0
  Filled 2024-12-04: qty 60, 30d supply, fill #1
  Filled 2025-01-03: qty 60, 30d supply, fill #2

## 2024-11-08 ENCOUNTER — Ambulatory Visit: Admitting: Cardiology

## 2024-11-11 ENCOUNTER — Other Ambulatory Visit: Payer: Self-pay

## 2024-11-11 ENCOUNTER — Other Ambulatory Visit: Payer: Self-pay | Admitting: "Endocrinology

## 2024-11-11 ENCOUNTER — Other Ambulatory Visit (HOSPITAL_COMMUNITY): Payer: Self-pay

## 2024-11-11 DIAGNOSIS — E1165 Type 2 diabetes mellitus with hyperglycemia: Secondary | ICD-10-CM

## 2024-11-11 MED ORDER — LANTUS SOLOSTAR 100 UNIT/ML ~~LOC~~ SOPN
16.0000 [IU] | PEN_INJECTOR | Freq: Every day | SUBCUTANEOUS | 1 refills | Status: AC
  Filled 2024-11-11: qty 9, 56d supply, fill #0
  Filled 2025-01-06: qty 9, 56d supply, fill #1

## 2024-11-11 MED ORDER — PEN NEEDLES 32G X 4 MM MISC
1.0000 | Freq: Every day | 2 refills | Status: AC
  Filled 2024-11-11 (×2): qty 100, 100d supply, fill #0

## 2024-11-13 ENCOUNTER — Ambulatory Visit
Admission: EM | Admit: 2024-11-13 | Discharge: 2024-11-13 | Disposition: A | Discharge status: Home / Self Care | Attending: Nurse Practitioner | Admitting: Nurse Practitioner

## 2024-11-13 DIAGNOSIS — J320 Chronic maxillary sinusitis: Secondary | ICD-10-CM

## 2024-11-13 MED ORDER — ALBUTEROL SULFATE (2.5 MG/3ML) 0.083% IN NEBU
2.5000 mg | INHALATION_SOLUTION | Freq: Once | RESPIRATORY_TRACT | Status: AC
  Administered 2024-11-13: 2.5 mg via RESPIRATORY_TRACT

## 2024-11-13 MED ORDER — METHYLPREDNISOLONE SODIUM SUCC 125 MG IJ SOLR
62.5000 mg | Freq: Once | INTRAMUSCULAR | Status: AC
  Administered 2024-11-13: 62.5 mg via INTRAMUSCULAR

## 2024-11-13 NOTE — Discharge Instructions (Signed)
 You were seen today for your chronic sinusitis. You were given a nebulizer treatment and injection of steroids here in the clinic. Continue all the medications prescribed by your allergist. Drink enough fluid to keep your urine pale yellow. Staying hydrated will also help to thin your mucus. Use a cool mist humidifier to keep the humidity level in your home above 50%. Inhale steam for 10-15 minutes, 3-4 times a day. You can do this in the bathroom while a hot shower is running and/or purchase over-the-counter vapor shower tablets which is great to help with nasal congestion.Try to limit your exposure to cool or dry air. Sleep with your head raised to decrease post-nasal drainage. Make sure you get enough sleep each night.

## 2024-11-13 NOTE — ED Provider Notes (Signed)
 [facility]    CSN: [id number] Arrival date & time: 11/13/24  0844      History   Chief Complaint Chief Complaint  Patient presents with   Cough    HPI Pamela Roberson is a 68 y.o. female.   Discussed the use of AI scribe software for clinical note transcription with the patient, who gave verbal consent to proceed.   The patient is a diabetic individual presenting with a twelve-day history of cough, low energy, and headaches. The cough is worse at night and is associated with postnasal drainage, nasal congestion, sinus pressure, and headaches. She denies throat pain, sneezing, or rhinorrhea. She reports having been treated for a sinus infection approximately three weeks ago without improvement. She experienced low-grade fevers earlier in the course, which have since resolved, though the cough has persisted. She notes an episode of wheezing about one week ago that has since improved.  The patient reports a chemical exposure one week ago and has been using her albuterol  nebulizer since that time. She continues routine sinus rinses as part of ongoing management. She has chronic sinus disease with a history of associated ear pain and episodes of sinus bleeding.  Regarding her diabetes, she reports improved glycemic control with a most recent A1c of 7.2%, improved from 9.1% earlier this year. She was evaluated by her allergist approximately one month ago and has known allergies to grass, pollen, and tobacco. Current medications include carbinoxamine  twice daily, ophthalmic drops as needed, nasal steroid washes once to twice daily with budesonide  rinses, montelukast  at bedtime, Symbicort  twice daily, albuterol  as needed, and pantoprazole  with famotidine  for acid reflux.  The following sections of the patient's history were reviewed and updated as appropriate: allergies, current medications, past family history, past medical history, past social history, past surgical history, and  problem list.        Past Medical History:  Diagnosis Date   Arthritis    Asthma    Carotid stenosis, bilateral    Carpal tunnel syndrome    Chronic low back pain    COPD (chronic obstructive pulmonary disease) (HCC)    Diabetes mellitus without complication (HCC)    type II    Diabetic neuropathy (HCC)    Diverticulitis    Dyspnea    mild    Frequent falls    GERD (gastroesophageal reflux disease)    History of bronchitis    Hypercholesterolemia    Hypertension    IBS (irritable bowel syndrome)    Osteoarthritis    knee   Palpitations    Right ventricular enlargement    per pt report   Sleep apnea    mild but no sleep apnea    TMJ (dislocation of temporomandibular joint)    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    Vitamin D deficiency     Patient Active Problem List   Diagnosis Date Noted   Class 1 obesity due to excess calories with body mass index (BMI) of 32.0 to 32.9 in adult 09/01/2024   Bad taste in mouth 09/01/2024   Blackhead 09/01/2024   Primary hypertension 02/13/2024   Diabetes mellitus with coincident hypertension (HCC) 02/13/2024   Class 1 obesity due to excess calories with body mass index (BMI) of 33.0 to 33.9 in adult 02/13/2024   Chronic maxillary sinusitis 01/30/2024   Epistaxis 12/25/2023   Hypertrophy of nasal turbinates 12/25/2023   COVID-19 vaccination declined 10/24/2023   Need for influenza vaccination 10/24/2023  Class 1 obesity without serious comorbidity with body mass index (BMI) of 30.0 to 30.9 in adult 10/24/2023   Encounter for annual health examination 08/31/2023   Type 2 diabetes mellitus with diabetic neuropathy, without long-term current use of insulin  (HCC) 08/31/2023   Estrogen deficiency 08/31/2023   Herpes zoster vaccination declined 08/31/2023   Influenza vaccination declined 08/31/2023   Post-nasal drip 08/31/2023   Muscle weakness (generalized) 08/31/2023   Degeneration of lumbar intervertebral disc 07/26/2023    Diabetic peripheral neuropathy (HCC) 07/26/2023   Left leg weakness 07/26/2023   Lumbar radiculopathy 07/26/2023   Pain in left knee 02/15/2022   Sciatica, left side 02/15/2022   Pain in left shoulder 11/16/2021   Internal hemorrhoids 11/03/2021   Leg cramps 11/03/2021   Myalgia 11/03/2021   Adenomatous polyp of colon 12/04/2020   Anal fissure 12/04/2020   Chronic idiopathic constipation 12/04/2020   Constipation 12/04/2020   Dysphagia 12/04/2020   Gastroesophageal reflux disease 12/04/2020   Rectal pain 12/04/2020   Right upper quadrant pain 12/04/2020   Dizzy 12/11/2017   Pharyngeal dysphagia 12/11/2017   Referred ear pain, bilateral 12/11/2017   Diabetes mellitus without complication (HCC) 06/29/2017   Family history of glaucoma 06/29/2017   Hyperopia of both eyes with astigmatism and presbyopia 06/29/2017   Keratoconjunctivitis sicca of both eyes not specified as Sjogren's 06/29/2017   Nuclear sclerotic cataract of both eyes 06/29/2017   Vitreous floater, bilateral 06/29/2017   Biceps tendinitis of right shoulder 04/05/2017   Impingement syndrome of right shoulder 04/05/2017   Arthritis of right acromioclavicular joint 04/05/2017   Partial nontraumatic tear of rotator cuff, right 04/05/2017    Past Surgical History:  Procedure Laterality Date   ABDOMINAL HYSTERECTOMY  1993   CARPAL TUNNEL RELEASE Right    CERVICAL DISC SURGERY  04/29/2015   Dr Erle [place], [place]   ESOPHAGEAL MANOMETRY N/A 03/03/2021   Procedure: ESOPHAGEAL MANOMETRY (EM);  Surgeon: Dianna Specking, MD;  Location: [facility];  Service: Endoscopy;  Laterality: N/A;   ESOPHAGOGASTRODUODENOSCOPY ENDOSCOPY     with esophagus being stretched twice per pt,    EYE SURGERY     fatty 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      feeding tube placement and removal   2015   NASAL SEPTUM SURGERY     ROTATOR CUFF REPAIR Right    spurs removed from esophagus   2015   TONSILLECTOMY      OB History   No  obstetric history on file.      Home Medications    Prior to Admission medications   Medication Sig Start Date End Date Taking? Authorizing Provider  Accu-Chek Softclix Lancets lancets Use to check blood sugar 3 (three) times daily (morning, noon, and bedtime) 03/14/24   Obadiah Birmingham, MD  acyclovir  (ZOVIRAX ) 400 MG tablet Take 1 tablet (400 mg total) by mouth 2 (two) times daily. 07/03/24   Georgina Speaks, FNP  albuterol  (PROVENTIL ) (2.5 MG/3ML) 0.083% nebulizer solution Take 3 mLs (2.5 mg total) by nebulization every 4 (four) hours as needed for wheezing or shortness of breath. 12/14/23   Jeneal Danita Macintosh, MD  albuterol  (VENTOLIN  HFA) 108 (90 Base) MCG/ACT inhaler Inhale 2 puffs into the lungs every 4 (four) hours as needed for wheezing or shortness of breath. 12/14/23   Jeneal Danita Macintosh, MD  amLODipine  (NORVASC ) 5 MG tablet Take 1 tablet (5 mg total) by mouth daily. 10/24/24   Tobb, Kardie, DO  Artificial Tear Ointment (DRY EYES OP) Place 1-2 drops  into both eyes daily as needed (for dry eyes).     [provider]  ascorbic acid (VITAMIN C) 1000 MG tablet Take 1 tablet by mouth daily.    [provider]  azelastine  (OPTIVAR ) 0.05 % ophthalmic solution Place 1 drop into both eyes 2 (two) times daily. Patient taking differently: Place 1 drop into both eyes 2 (two) times daily as needed. 04/18/24   Jeneal Danita Macintosh, MD  Blood Glucose Monitoring Suppl DEVI 1 each by Does not apply route in the morning, at noon, and at bedtime. May substitute to any manufacturer covered by patient's insurance. 03/14/24   Obadiah Birmingham, MD  budesonide  (PULMICORT ) 0.5 MG/2ML nebulizer solution Add 2mL vial to nasal saline rinse bottle and perform nasal saline rinse twice a day. 10/16/24   Jeneal Danita Macintosh, MD  budesonide -formoterol  (SYMBICORT ) 160-4.5 MCG/ACT inhaler Inhale 2 puffs into the lungs 2 (two) times daily with a spacer. 04/18/24   Jeneal Danita Macintosh, MD   Carbinoxamine  Maleate 4 MG TABS Take 2 tablets (8 mg total) by mouth 2 (two) times daily. 06/04/24   Jeneal Danita Macintosh, MD  cholecalciferol (VITAMIN D3) 25 MCG (1000 UNIT) tablet Take 2,000 Units by mouth daily.    [provider]  clotrimazole  (LOTRIMIN ) 1 % cream Apply 1 Application topically to rash 2 (two) times daily. 06/16/24   Rising, Asberry, PA-C  clotrimazole  (LOTRIMIN ) 1 % cream Apply 1 Application topically 2 (two) times daily for 14 days. 10/16/24   Jeneal Danita Macintosh, MD  Continuous Glucose Receiver (DEXCOM G7 RECEIVER) DEVI 1 Device by Does not apply route continuous. 03/14/24   Komal, Motwani, MD  Continuous Glucose Sensor (DEXCOM G7 SENSOR) MISC Use to monitor blood sugar levels continuously, changing sensor every 10 days. 07/18/24   Komal, Motwani, MD  cycloSPORINE (RESTASIS) 0.05 % ophthalmic emulsion Place 1 drop into both eyes 2 times daily. 03/08/22   [provider]  EPINEPHrine  0.3 mg/0.3 mL IJ SOAJ injection Inject 0.3 mg into the muscle as needed for anaphylaxis. 12/14/23   Jeneal Danita Macintosh, MD  famotidine  (PEPCID ) 20 MG tablet Take 1 tablet (20 mg total) by mouth 1 to 2 times daily. 04/09/24   Jeneal Danita Macintosh, MD  fluticasone  (FLONASE ) 50 MCG/ACT nasal spray Place 2 sprays into both nostrils daily. 12/25/23     glipiZIDE  (GLUCOTROL ) 5 MG tablet Take 1 tablet (5 mg total) by mouth daily before breakfast. Hold if blood sugar is less than 100 07/18/24   Komal, Motwani, MD  insulin  glargine (LANTUS  SOLOSTAR) 100 UNIT/ML Solostar Pen Inject 16 Units into the skin daily. 11/11/24   Komal, Motwani, MD  Insulin  Pen Needle (PEN NEEDLES) 32G X 4 MM MISC Use once daily. 11/11/24   Obadiah Birmingham, MD  Lancets Misc. (ACCU-CHEK SOFTCLIX LANCET DEV) KIT Use to check blood sugar in the morning, at noon, and at bedtime. 03/14/24   Obadiah Birmingham, MD  lidocaine  (XYLOCAINE ) 2 % solution Rinse and gargle with 94ml's as directed in the mouth or throat every 4  hours as needed. 03/31/24   White, Elizabeth A, PA-C  metFORMIN  (GLUCOPHAGE -XR) 500 MG 24 hr tablet Take 1 tablet (500 mg total) by mouth 2 (two) times daily with a meal. 09/26/24   Komal, Motwani, MD  metoprolol  succinate (TOPROL -XL) 25 MG 24 hr tablet Take 1 tablet (25 mg total) by mouth daily. 10/24/24   Tobb, Kardie, DO  montelukast  (SINGULAIR ) 10 MG tablet Take 1 tablet (10 mg total) by mouth daily. 04/18/24  Jeneal Danita Macintosh, MD  Multiple Vitamin (MULTIVITAMIN WITH MINERALS) TABS tablet Take 1 tablet by mouth daily.    [provider]  nitroGLYCERIN  (NITROSTAT ) 0.4 MG SL tablet Place 1 tablet (0.4 mg total) under the tongue every 5 (five) minutes as needed for chest pain as directed. 07/10/24   Tobb, Kardie, DO  nystatin  (MYCOSTATIN ) 100000 UNIT/ML suspension Take 5 mLs (500,000 Units total) by mouth 4 (four) times daily. Swish and spit 08/22/24   Georgina Speaks, FNP  ondansetron  (ZOFRAN -ODT) 4 MG disintegrating tablet Take 1 tablet (4 mg total) by mouth every 8 (eight) hours as needed for nausea or vomiting. 11/26/23   Teresa Almarie LABOR, PA-C  ondansetron  (ZOFRAN -ODT) 4 MG disintegrating tablet Place 1 tablet (4mg ) on the tongue and allow to dissolve once a day as needed. 11/07/24     pantoprazole  (PROTONIX ) 40 MG tablet Take 1 tablet (40 mg total) by mouth daily. 07/10/24   Jeneal Danita Macintosh, MD  pantoprazole  (PROTONIX ) 40 MG tablet Take 1 tablet (40 mg total) by mouth 2 (two) times daily. 11/07/24     pravastatin  (PRAVACHOL ) 20 MG tablet Take 1 tablet (20 mg total) by mouth every evening. 10/24/24 10/24/25  Tobb, Kardie, DO  sucralfate  (CARAFATE ) 1 g tablet Take 1 tablet (1 g total) by mouth 2 (two) times daily on an empty stomach. 11/07/24     triamcinolone  ointment (KENALOG ) 0.1 % Apply 1 Application topically 2 (two) times daily as needed for rash/itch. 10/16/24   Jeneal Danita Macintosh, MD  esomeprazole  (NEXIUM ) 40 MG capsule Take 1 capsule (40 mg total) by mouth  daily. Patient not taking: Reported on 07/08/2020 04/24/19 08/06/20  Armenta Canning, MD    Family History Family History  Problem Relation Age of Onset   Allergic rhinitis Mother    Diabetes Father        type 2   Heart disease Father    Hypertension Father    Allergic rhinitis Father    Allergic rhinitis Sister    Collagen disease Sister    Heart disease Brother    Hypertension Brother    Angioedema Neg Hx    Asthma Neg Hx    Atopy Neg Hx    Eczema Neg Hx    Immunodeficiency Neg Hx    Urticaria Neg Hx     Social History Social History   Tobacco Use   Smoking status: Former    Current packs/day: 0.00    Average packs/day: 0.5 packs/day for 30.0 years (15.0 ttl pk-yrs)    Types: Cigarettes    Start date: 12/08/1964    Quit date: 12/08/1994    Years since quitting: 29.9   Smokeless tobacco: Never  Vaping Use   Vaping status: Never Used  Substance Use Topics   Alcohol use: No   Drug use: No     Allergies   Crab [shellfish allergy], Erythromycin, Hm lidocaine  patch [lidocaine ], Buprenorphine, Fish allergy, Metoclopramide, Doxycycline , Levaquin [levofloxacin in d5w], Levofloxacin, Morphine, Sulfa antibiotics, and Sulfacetamide sodium-sulfur   Review of Systems Review of Systems  Constitutional:  Positive for fatigue and fever.  HENT:  Positive for congestion, sinus pressure, sinus pain, sneezing and sore throat. Negative for postnasal drip and rhinorrhea.   Respiratory:  Positive for cough and wheezing.   Neurological:  Positive for headaches.  All other systems reviewed and are negative.    Physical Exam Triage Vital Signs ED Triage Vitals  Encounter Vitals Group     BP 11/13/24 0917 119/79  Girls Systolic BP Percentile --      Girls Diastolic BP Percentile --      Boys Systolic BP Percentile --      Boys Diastolic BP Percentile --      Pulse Rate 11/13/24 0918 (!) 104     Resp 11/13/24 0917 18     Temp 11/13/24 0917 98.7 F (37.1 C)     Temp Source  11/13/24 0917 Oral     SpO2 11/13/24 0917 96 %     Weight --      Height --      Head Circumference --      Peak Flow --      Pain Score --      Pain Loc --      Pain Education --      Exclude from Growth Chart --    No data found.  Updated Vital Signs BP 119/79 (BP Location: Right Arm)   Pulse (!) 104 Comment: Reports she was told by the Dr her pulse was high  Temp 98.7 F (37.1 C) (Oral)   Resp 18   SpO2 96%   Visual Acuity Right Eye Distance:   Left Eye Distance:   Bilateral Distance:    Right Eye Near:   Left Eye Near:    Bilateral Near:     Physical Exam Vitals reviewed.  Constitutional:      General: She is awake. She is not in acute distress.    Appearance: Normal appearance. She is well-developed. She is not ill-appearing, toxic-appearing or diaphoretic.  HENT:     Head: Normocephalic.     Right Ear: Hearing, tympanic membrane, ear canal and external ear normal. No drainage, swelling or tenderness. No middle ear effusion. Tympanic membrane is not erythematous.     Left Ear: Hearing, tympanic membrane, ear canal and external ear normal. No drainage, swelling or tenderness.  No middle ear effusion. Tympanic membrane is not erythematous.     Nose: Congestion present.     Right Sinus: Maxillary sinus tenderness present. No frontal sinus tenderness.     Left Sinus: Maxillary sinus tenderness present. No frontal sinus tenderness.     Mouth/Throat:     Lips: Pink.     Mouth: Mucous membranes are moist.     Dentition: No gingival swelling (edentulous).     Pharynx: Oropharynx is clear. Uvula midline. No pharyngeal swelling, oropharyngeal exudate, posterior oropharyngeal erythema or uvula swelling.     Tonsils: No tonsillar exudate or tonsillar abscesses.  Eyes:     General: Vision grossly intact.     Conjunctiva/sclera: Conjunctivae normal.  Neck:     Trachea: Phonation normal.  Cardiovascular:     Rate and Rhythm: Normal rate and regular rhythm.     Heart  sounds: Normal heart sounds.  Pulmonary:     Effort: Pulmonary effort is normal.     Breath sounds: Normal breath sounds and air entry.  Musculoskeletal:        General: Normal range of motion.     Cervical back: Full passive range of motion without pain, normal range of motion and neck supple.  Lymphadenopathy:     Cervical: No cervical adenopathy.  Skin:    General: Skin is warm and dry.  Neurological:     General: No focal deficit present.     Mental Status: She is alert and oriented to person, place, and time.  Psychiatric:        Mood and Affect: Mood normal.  Behavior: Behavior normal. Behavior is cooperative.      UC Treatments / Results  Labs (all labs ordered are listed, but only abnormal results are displayed) Labs Reviewed - No data to display  EKG   Radiology No results found.  Procedures Procedures (including critical care time)  Medications Ordered in UC Medications  albuterol  (PROVENTIL ) (2.5 MG/3ML) 0.083% nebulizer solution 2.5 mg (2.5 mg Nebulization Given 11/13/24 0942)  methylPREDNISolone  sodium succinate (SOLU-MEDROL ) 125 mg/2 mL injection 62.5 mg (62.5 mg Intramuscular Given 11/13/24 0942)    Initial Impression / Assessment and Plan / UC Course  I have reviewed the triage vital signs and the nursing notes.  Pertinent labs & imaging results that were available during my care of the patient were reviewed by me and considered in my medical decision making (see chart for details).      The patient has chronic sinusitis with persistent nasal congestion, sinus pressure, headaches, and post-nasal drainage. Known history of environmental allergies and is on a comprehensive allergy management regimen. She is currently under the care of ENT and an allergist for ongoing management.  Clinical presentation is most consistent with post-infectious or allergic cough contributing to airway inflammation rather than acute bacterial infection or pneumonia.  Breathing treatment was administered in clinic to help mobilize airway secretions, and a steroid injection was given to reduce airway and upper respiratory inflammation. The patient was advised to continue her allergy regimen and supportive care measures and follow up with her primary care provider or pulmonology/allergy specialist if the cough does not improve over the next several days, worsens, or becomes recurrent. The patient was instructed to seek emergency care for new or worsening shortness of breath, chest pain, persistent or high fever, bluish discoloration of lips or fingertips, severe weakness, confusion, or signs of respiratory distress.  Today's evaluation has revealed no signs of a dangerous process. Discussed diagnosis with patient and/or guardian. Patient and/or guardian aware of their diagnosis, possible red flag symptoms to watch out for and need for close follow up. Patient and/or guardian understands verbal and written discharge instructions. Patient and/or guardian comfortable with plan and disposition.  Patient and/or guardian has a clear mental status at this time, good insight into illness (after discussion and teaching) and has clear judgment to make decisions regarding their care  Documentation was completed with the aid of voice recognition software. Transcription may contain typographical errors.  Final Clinical Impressions(s) / UC Diagnoses   Final diagnoses:  Chronic maxillary sinusitis     Discharge Instructions      You were seen today for your chronic sinusitis. You were given a nebulizer treatment and injection of steroids here in the clinic. Continue all the medications prescribed by your allergist. Drink enough fluid to keep your urine pale yellow. Staying hydrated will also help to thin your mucus. Use a cool mist humidifier to keep the humidity level in your home above 50%. Inhale steam for 10-15 minutes, 3-4 times a day. You can do this in the bathroom while a  hot shower is running and/or purchase over-the-counter vapor shower tablets which is great to help with nasal congestion.Try to limit your exposure to cool or dry air. Sleep with your head raised to decrease post-nasal drainage. Make sure you get enough sleep each night.      ED Prescriptions   None    PDMP not reviewed this encounter.   [place], [place] 11/13/24 [phone]

## 2024-11-13 NOTE — ED Triage Notes (Signed)
 Pt she have cough,mucus, low ebergy headache and sinus pressure x 12 days. Cough is worse at night. Reports she had asthma flare dup after she was exposed to chemical 1 week ago. Pt reports she was treated it for sinus infection without relief 3 weeks ago.

## 2024-11-18 ENCOUNTER — Other Ambulatory Visit (HOSPITAL_COMMUNITY): Payer: Self-pay

## 2024-11-18 ENCOUNTER — Other Ambulatory Visit: Payer: Self-pay

## 2024-11-18 MED ORDER — ALCOHOL SWABS PADS
1.0000 | MEDICATED_PAD | 1 refills | Status: AC | PRN
  Filled 2024-11-18: qty 100, 30d supply, fill #0

## 2024-11-19 ENCOUNTER — Other Ambulatory Visit (HOSPITAL_COMMUNITY): Payer: Self-pay

## 2024-11-21 ENCOUNTER — Encounter: Payer: Self-pay | Admitting: "Endocrinology

## 2024-11-21 ENCOUNTER — Ambulatory Visit: Admitting: "Endocrinology

## 2024-11-21 VITALS — BP 138/80 | HR 120 | Ht 66.0 in | Wt 192.0 lb

## 2024-11-21 DIAGNOSIS — Z7984 Long term (current) use of oral hypoglycemic drugs: Secondary | ICD-10-CM

## 2024-11-21 DIAGNOSIS — E1165 Type 2 diabetes mellitus with hyperglycemia: Secondary | ICD-10-CM | POA: Diagnosis not present

## 2024-11-21 DIAGNOSIS — G629 Polyneuropathy, unspecified: Secondary | ICD-10-CM | POA: Diagnosis not present

## 2024-11-21 DIAGNOSIS — E7800 Pure hypercholesterolemia, unspecified: Secondary | ICD-10-CM

## 2024-11-21 DIAGNOSIS — Z794 Long term (current) use of insulin: Secondary | ICD-10-CM | POA: Diagnosis not present

## 2024-11-21 NOTE — Progress Notes (Signed)
 Outpatient Endocrinology Note Pamela Birmingham, MD  11/21/2024   Pamela Roberson Mar 24, 1956 [id number]  Referring Provider: Georgina Speaks, FNP Primary Care Provider: Georgina Speaks, FNP Reason for consultation: Subjective   Assessment & Plan  Diagnoses and all orders for this visit:  Uncontrolled type 2 diabetes mellitus with hyperglycemia (HCC)  Neuropathy  Long term (current) use of oral hypoglycemic drugs  Long-term insulin  use (HCC)  Pure hypercholesterolemia    Diabetes Type II complicated by neuropathy,  Lab Results  Component Value Date   GFR 89.00 09/01/2016   Hba1c goal less than 7, current Hba1c is  Lab Results  Component Value Date   HGBA1C 7.2 (H) 08/22/2024   Will recommend the following: Lantus  16 units every day  Metformin  XR 500mg , one pill with break fast and 1 pill with supper: max tolerated dose  Glipizide  5 mg (half a pill) qam if blood sugar is more than 150 before break fast  Took mounjaro  2.5mg /week-stopped it due to constipation Took ozempic -stopped due to back rash  Regular metformin  caused GI S/E Reports groin rash improved now Couldn't tolerate jardiance    Previously,  Ordered DexCom G7 Ordered DM education Ordered meter with supplies  No known contraindications/side effects to any of above medications Glucagon discussed and prescribed with refills on 11/21/2024  -Last LD and Tg are as follows: Lab Results  Component Value Date   LDLCALC 46 08/22/2024    Lab Results  Component Value Date   TRIG 92 08/22/2024   -On pravastatin  20 mg QD -Follow low fat diet and exercise   -Blood pressure goal <140/90 - Microalbumin/creatinine goal is < 30 -Last MA/Cr is as follows: Lab Results  Component Value Date   MICROALBUR 0.7 01/15/2024   -not on ACE/ARB -diet changes including salt restriction -limit eating outside -counseled BP targets per standards of diabetes care -uncontrolled blood pressure can lead to  retinopathy, nephropathy and cardiovascular and atherosclerotic heart disease  Reviewed and counseled on: -A1C target -Blood sugar targets -Complications of uncontrolled diabetes  -Checking blood sugar before meals and bedtime and bring log next visit -All medications with mechanism of action and side effects -Hypoglycemia management: rule of 15's, Glucagon Emergency Kit and medical alert ID -low-carb low-fat plate-method diet -At least 20 minutes of physical activity per day -Annual dilated retinal eye exam and foot exam -compliance and follow up needs -follow up as scheduled or earlier if problem gets worse  Call if blood sugar is less than 70 or consistently above 250    Take a 15 gm snack of carbohydrate at bedtime before you go to sleep if your blood sugar is less than 100.    If you are going to fast after midnight for a test or procedure, ask your physician for instructions on how to reduce/decrease your insulin  dose.    Call if blood sugar is less than 70 or consistently above 250  -Treating a low sugar by rule of 15  (15 gms of sugar every 15 min until sugar is more than 70) If you feel your sugar is low, test your sugar to be sure If your sugar is low (less than 70), then take 15 grams of a fast acting Carbohydrate (3-4 glucose tablets or glucose gel or 4 ounces of juice or regular soda) Recheck your sugar 15 min after treating low to make sure it is more than 70 If sugar is still less than 70, treat again with 15 grams of carbohydrate  Don't drive the hour of hypoglycemia  If unconscious/unable to eat or drink by mouth, use glucagon injection or nasal spray baqsimi and call 911. Can repeat again in 15 min if still unconscious.  Return in about 3 months (around 02/19/2025).   I have reviewed current medications, nurse's notes, allergies, vital signs, past medical and surgical history, family medical history, and social history for this encounter. Counseled patient  on symptoms, examination findings, lab findings, imaging results, treatment decisions and monitoring and prognosis. The patient understood the recommendations and agrees with the treatment plan. All questions regarding treatment plan were fully answered.  Pamela Birmingham, MD  11/21/2024    History of Present Illness Pamela Roberson is a 68 y.o. year old female who presents for follow up of Type II diabetes mellitus.  Daiya K Davtyan was first diagnosed in 2005.   Diabetes education +  Home diabetes regimen: Lantus  14 units every day  Metformin  XR 500mg , one pill bid  Glipizide  5 mg qam PRB high BG  Took mounjaro  2.5mg /week-stopped it due to constipation Took ozempic -stopped due to back rash  Metformin  caused GI S/E Reports groin rash right now  COMPLICATIONS -  MI/Stroke -  retinopathy +  neuropathy -  nephropathy  BLOOD SUGAR DATA CGM interpretation: At today's visit, we reviewed her CGM downloads. The full report is scanned in the media. Reviewing the CGM trends, herlene is only active <3 days, with highs/very high at 46%.  Physical Exam  BP 138/80   Pulse (!) 120   Ht 5' 6 (1.676 m)   Wt 192 lb (87.1 kg)   SpO2 98%   BMI 30.99 kg/m    Constitutional: well developed, well nourished Head: normocephalic, atraumatic Eyes: sclera anicteric, no redness Neck: supple Lungs: normal respiratory effort Neurology: alert and oriented Skin: dry, no appreciable rashes Musculoskeletal: no appreciable defects Psychiatric: normal mood and affect Diabetic Foot Exam - Simple   No data filed      Current Medications Patient's Medications  New Prescriptions   No medications on file  Previous Medications   ACCU-CHEK SOFTCLIX LANCETS LANCETS    Use to check blood sugar 3 (three) times daily (morning, noon, and bedtime)   ACYCLOVIR  (ZOVIRAX ) 400 MG TABLET    Take 1 tablet (400 mg total) by mouth 2 (two) times daily.   ALBUTEROL  (PROVENTIL ) (2.5 MG/3ML) 0.083% NEBULIZER  SOLUTION    Take 3 mLs (2.5 mg total) by nebulization every 4 (four) hours as needed for wheezing or shortness of breath.   ALBUTEROL  (VENTOLIN  HFA) 108 (90 BASE) MCG/ACT INHALER    Inhale 2 puffs into the lungs every 4 (four) hours as needed for wheezing or shortness of breath.   ALCOHOL  SWABS  PADS    Use as directed as needed   AMLODIPINE  (NORVASC ) 5 MG TABLET    Take 1 tablet (5 mg total) by mouth daily.   ARTIFICIAL TEAR OINTMENT (DRY EYES OP)    Place 1-2 drops into both eyes daily as needed (for dry eyes).    ASCORBIC ACID (VITAMIN C) 1000 MG TABLET    Take 1 tablet by mouth daily.   AZELASTINE  (OPTIVAR ) 0.05 % OPHTHALMIC SOLUTION    Place 1 drop into both eyes 2 (two) times daily.   BLOOD GLUCOSE MONITORING SUPPL DEVI    1 each by Does not apply route in the morning, at noon, and at bedtime. May substitute to any manufacturer covered by patient's insurance.   BUDESONIDE  (PULMICORT ) 0.5 MG/2ML NEBULIZER SOLUTION  Add 2mL vial to nasal saline rinse bottle and perform nasal saline rinse twice a day.   BUDESONIDE -FORMOTEROL  (SYMBICORT ) 160-4.5 MCG/ACT INHALER    Inhale 2 puffs into the lungs 2 (two) times daily with a spacer.   CARBINOXAMINE  MALEATE 4 MG TABS    Take 2 tablets (8 mg total) by mouth 2 (two) times daily.   CHOLECALCIFEROL (VITAMIN D3) 25 MCG (1000 UNIT) TABLET    Take 2,000 Units by mouth daily.   CLOTRIMAZOLE  (LOTRIMIN ) 1 % CREAM    Apply 1 Application topically to rash 2 (two) times daily.   CLOTRIMAZOLE  (LOTRIMIN ) 1 % CREAM    Apply 1 Application topically 2 (two) times daily for 14 days.   CONTINUOUS GLUCOSE RECEIVER (DEXCOM G7 RECEIVER) DEVI    1 Device by Does not apply route continuous.   CONTINUOUS GLUCOSE SENSOR (DEXCOM G7 SENSOR) MISC    Use to monitor blood sugar levels continuously, changing sensor every 10 days.   CYCLOSPORINE (RESTASIS) 0.05 % OPHTHALMIC EMULSION    Place 1 drop into both eyes 2 times daily.   EPINEPHRINE  0.3 MG/0.3 ML IJ SOAJ INJECTION    Inject  0.3 mg into the muscle as needed for anaphylaxis.   FAMOTIDINE  (PEPCID ) 20 MG TABLET    Take 1 tablet (20 mg total) by mouth 1 to 2 times daily.   FLUTICASONE  (FLONASE ) 50 MCG/ACT NASAL SPRAY    Place 2 sprays into both nostrils daily.   GLIPIZIDE  (GLUCOTROL ) 5 MG TABLET    Take 1 tablet (5 mg total) by mouth daily before breakfast. Hold if blood sugar is less than 100   INSULIN  GLARGINE (LANTUS  SOLOSTAR) 100 UNIT/ML SOLOSTAR PEN    Inject 16 Units into the skin daily.   INSULIN  PEN NEEDLE (PEN NEEDLES) 32G X 4 MM MISC    Use once daily.   LANCETS MISC. (ACCU-CHEK SOFTCLIX LANCET DEV) KIT    Use to check blood sugar in the morning, at noon, and at bedtime.   LIDOCAINE  (XYLOCAINE ) 2 % SOLUTION    Rinse and gargle with 21ml's as directed in the mouth or throat every 4 hours as needed.   METFORMIN  (GLUCOPHAGE -XR) 500 MG 24 HR TABLET    Take 1 tablet (500 mg total) by mouth 2 (two) times daily with a meal.   METOPROLOL  SUCCINATE (TOPROL -XL) 25 MG 24 HR TABLET    Take 1 tablet (25 mg total) by mouth daily.   MONTELUKAST  (SINGULAIR ) 10 MG TABLET    Take 1 tablet (10 mg total) by mouth daily.   MULTIPLE VITAMIN (MULTIVITAMIN WITH MINERALS) TABS TABLET    Take 1 tablet by mouth daily.   NITROGLYCERIN  (NITROSTAT ) 0.4 MG SL TABLET    Place 1 tablet (0.4 mg total) under the tongue every 5 (five) minutes as needed for chest pain as directed.   NYSTATIN  (MYCOSTATIN ) 100000 UNIT/ML SUSPENSION    Take 5 mLs (500,000 Units total) by mouth 4 (four) times daily. Swish and spit   ONDANSETRON  (ZOFRAN -ODT) 4 MG DISINTEGRATING TABLET    Take 1 tablet (4 mg total) by mouth every 8 (eight) hours as needed for nausea or vomiting.   ONDANSETRON  (ZOFRAN -ODT) 4 MG DISINTEGRATING TABLET    Place 1 tablet (4mg ) on the tongue and allow to dissolve once a day as needed.   PANTOPRAZOLE  (PROTONIX ) 40 MG TABLET    Take 1 tablet (40 mg total) by mouth daily.   PANTOPRAZOLE  (PROTONIX ) 40 MG TABLET    Take 1 tablet (40 mg  total) by  mouth 2 (two) times daily.   PRAVASTATIN  (PRAVACHOL ) 20 MG TABLET    Take 1 tablet (20 mg total) by mouth every evening.   SUCRALFATE  (CARAFATE ) 1 G TABLET    Take 1 tablet (1 g total) by mouth 2 (two) times daily on an empty stomach.   TRIAMCINOLONE  OINTMENT (KENALOG ) 0.1 %    Apply 1 Application topically 2 (two) times daily as needed for rash/itch.  Modified Medications   No medications on file  Discontinued Medications   No medications on file    Allergies Allergies  Allergen Reactions   Crab [Shellfish Allergy] Shortness Of Breath and Swelling   Erythromycin Swelling and Rash   Hm Lidocaine  Patch [Lidocaine ]     Burning on skin   Buprenorphine Dermatitis    buprenorphine hydrochloride   Fish Allergy     Swelling    Metoclopramide Other (See Comments)    Slurred speech and unable to move muscles,  Caused her to drag her feet   Doxycycline  Other (See Comments)    GI Intolerance  doxycycline    Levaquin [Levofloxacin In D5w] Other (See Comments)    Causes irregular heart beat   Levofloxacin Palpitations and Other (See Comments)    levofloxacin   Morphine Nausea And Vomiting and Rash    Irregular heart beat   Sulfa Antibiotics Rash    itching   Sulfacetamide Sodium-Sulfur Other (See Comments) and Rash    Other reaction(s): itching, rash    Past Medical History Past Medical History:  Diagnosis Date   Arthritis    Asthma    Carotid stenosis, bilateral    Carpal tunnel syndrome    Chronic low back pain    COPD (chronic obstructive pulmonary disease) (HCC)    Diabetes mellitus without complication (HCC)    type II    Diabetic neuropathy (HCC)    Diverticulitis    Dyspnea    mild    Frequent falls    GERD (gastroesophageal reflux disease)    History of bronchitis    Hypercholesterolemia    Hypertension    IBS (irritable bowel syndrome)    Osteoarthritis    knee   Palpitations    Right ventricular enlargement    per pt report   Sleep apnea    mild but no  sleep apnea    TMJ (dislocation of temporomandibular joint)    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    Vitamin D deficiency     Past Surgical History Past Surgical History:  Procedure Laterality Date   ABDOMINAL HYSTERECTOMY  1993   CARPAL TUNNEL RELEASE Right    CERVICAL DISC SURGERY  04/29/2015   [place]   ESOPHAGEAL MANOMETRY N/A 03/03/2021   Procedure: ESOPHAGEAL MANOMETRY (EM);  Surgeon: Dianna Specking, MD;  Location: [facility];  Service: Endoscopy;  Laterality: N/A;   ESOPHAGOGASTRODUODENOSCOPY ENDOSCOPY     with esophagus being stretched twice per pt,    EYE SURGERY     fatty 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      feeding tube placement and removal   2015   NASAL SEPTUM SURGERY     ROTATOR CUFF REPAIR Right    spurs removed from esophagus   2015   TONSILLECTOMY      Family History family history includes Allergic rhinitis in her father, mother, and sister; Collagen disease in her sister; Diabetes in her father; Heart disease in her brother and father; Hypertension in her brother  and father.  Social History Social History   Socioeconomic History   Marital status: Widowed    Spouse name: Not on file   Number of children: 3   Years of education: Not on file   Highest education level: 11th grade  Occupational History    Comment: NA  Tobacco Use   Smoking status: Former    Current packs/day: 0.00    Average packs/day: 0.5 packs/day for 30.0 years (15.0 ttl pk-yrs)    Types: Cigarettes    Start date: 12/08/1964    Quit date: 12/08/1994    Years since quitting: 29.9   Smokeless tobacco: Never  Vaping Use   Vaping status: Never Used  Substance and Sexual Activity   Alcohol  use: No   Drug use: No   Sexual activity: Not Currently  Other Topics Concern   Not on file  Social History Narrative   Lives with daughter   Caffeine- coffee 12 oz   Social Drivers of Health   Tobacco Use: Medium Risk (11/21/2024)   Patient History    Smoking  Tobacco Use: Former    Smokeless Tobacco Use: Never    Passive Exposure: Not on Actuary Strain: Low Risk (08/23/2024)   Overall Financial Resource Strain (CARDIA)    Difficulty of Paying Living Expenses: Not very hard  Food Insecurity: No Food Insecurity (08/23/2024)   Epic    Worried About Programme Researcher, Broadcasting/film/video in the Last Year: Never true    Ran Out of Food in the Last Year: Never true  Transportation Needs: No Transportation Needs (08/23/2024)   Epic    Lack of Transportation (Medical): No    Lack of Transportation (Non-Medical): No  Physical Activity: Insufficiently Active (08/23/2024)   Exercise Vital Sign    Days of Exercise per Week: 3 days    Minutes of Exercise per Session: 30 min  Stress: No Stress Concern Present (08/23/2024)   [employer] of Occupational Health - Occupational Stress Questionnaire    Feeling of Stress: Not at all  Social Connections: Moderately Integrated (08/23/2024)   Social Connection and Isolation Panel    Frequency of Communication with Friends and Family: Three times a week    Frequency of Social Gatherings with Friends and Family: Once a week    Attends Religious Services: More than 4 times per year    Active Member of [employer] or Organizations: Yes    Attends Banker Meetings: More than 4 times per year    Marital Status: Widowed  Intimate Partner Violence: Not At Risk (08/23/2024)   Epic    Fear of Current or Ex-Partner: No    Emotionally Abused: No    Physically Abused: No    Sexually Abused: No  Depression (PHQ2-9): Low Risk (08/23/2024)   Depression (PHQ2-9)    PHQ-2 Score: 0  Alcohol  Screen: Low Risk (08/23/2024)   Alcohol  Screen    Last Alcohol  Screening Score (AUDIT): 0  Housing: Unknown (08/23/2024)   Epic    Unable to Pay for Housing in the Last Year: No    Number of Times Moved in the Last Year: Not on file    Homeless in the Last Year: No  Utilities: Not At Risk (08/23/2024)   Epic    Threatened with  loss of utilities: No  Health Literacy: Adequate Health Literacy (08/23/2024)   B1300 Health Literacy    Frequency of need for help with medical instructions: Never    Lab Results  Component Value  Date   HGBA1C 7.2 (H) 08/22/2024   HGBA1C 7.5 (A) 07/18/2024   HGBA1C 8.7 (A) 04/16/2024   Lab Results  Component Value Date   CHOL 122 08/22/2024   Lab Results  Component Value Date   HDL 59 08/22/2024   Lab Results  Component Value Date   LDLCALC 46 08/22/2024   Lab Results  Component Value Date   TRIG 92 08/22/2024   Lab Results  Component Value Date   CHOLHDL 2.1 08/22/2024   Lab Results  Component Value Date   CREATININE 0.77 08/22/2024   Lab Results  Component Value Date   GFR 89.00 09/01/2016   Lab Results  Component Value Date   MICROALBUR 0.7 01/15/2024      Component Value Date/Time   NA 143 08/22/2024 1131   K 4.6 08/22/2024 1131   CL 106 08/22/2024 1131   CO2 21 08/22/2024 1131   GLUCOSE 128 (H) 08/22/2024 1131   GLUCOSE 175 (H) 03/02/2024 0943   BUN 13 08/22/2024 1131   CREATININE 0.77 08/22/2024 1131   CREATININE 0.98 01/15/2024 1237   CALCIUM  9.5 08/22/2024 1131   PROT 6.1 08/22/2024 1131   ALBUMIN 4.1 08/22/2024 1131   AST 16 08/22/2024 1131   ALT 14 08/22/2024 1131   ALKPHOS 91 08/22/2024 1131   BILITOT 0.5 08/22/2024 1131   GFRNONAA >60 03/02/2024 0943   GFRAA >60 07/08/2020 2000      Latest Ref Rng & Units 08/22/2024   11:31 AM 07/10/2024    9:47 AM 03/02/2024    9:43 AM  BMP  Glucose 70 - 99 mg/dL 871  849  824   BUN 8 - 27 mg/dL 13  9  7    Creatinine 0.57 - 1.00 mg/dL 9.22  9.20  9.23   BUN/Creat Ratio 12 - 28 17  11     Sodium 134 - 144 mmol/L 143  142  141   Potassium 3.5 - 5.2 mmol/L 4.6  4.1  3.9   Chloride 96 - 106 mmol/L 106  103  110   CO2 20 - 29 mmol/L 21  19  23    Calcium  8.7 - 10.3 mg/dL 9.5  89.8  9.4        Component Value Date/Time   WBC 5.7 08/22/2024 1131   WBC 4.8 03/02/2024 0943   RBC 5.06 08/22/2024 1131    RBC 5.56 (H) 03/02/2024 0943   HGB 14.1 08/22/2024 1131   HCT 45.2 08/22/2024 1131   PLT 289 08/22/2024 1131   MCV 89 08/22/2024 1131   MCH 27.9 08/22/2024 1131   MCH 29.0 03/02/2024 0943   MCHC 31.2 (L) 08/22/2024 1131   MCHC 32.5 03/02/2024 0943   RDW 13.0 08/22/2024 1131   LYMPHSABS 1.9 08/22/2024 1131   MONOABS 0.5 03/02/2024 0943   EOSABS 0.1 08/22/2024 1131   BASOSABS 0.0 08/22/2024 1131     Parts of this note may have been dictated using voice recognition software. There may be variances in spelling and vocabulary which are unintentional. Not all errors are proofread. Please notify the dino if any discrepancies are noted or if the meaning of any statement is not clear.

## 2024-11-21 NOTE — Patient Instructions (Signed)

## 2024-11-23 ENCOUNTER — Other Ambulatory Visit (HOSPITAL_COMMUNITY): Payer: Self-pay

## 2024-11-25 ENCOUNTER — Other Ambulatory Visit: Payer: Self-pay

## 2024-11-26 ENCOUNTER — Other Ambulatory Visit (HOSPITAL_COMMUNITY): Payer: Self-pay

## 2024-11-27 ENCOUNTER — Other Ambulatory Visit (HOSPITAL_COMMUNITY): Payer: Self-pay

## 2024-12-02 ENCOUNTER — Other Ambulatory Visit (HOSPITAL_COMMUNITY): Payer: Self-pay

## 2024-12-04 ENCOUNTER — Other Ambulatory Visit: Payer: Self-pay

## 2024-12-04 ENCOUNTER — Other Ambulatory Visit (HOSPITAL_COMMUNITY): Payer: Self-pay

## 2024-12-04 ENCOUNTER — Other Ambulatory Visit: Payer: Self-pay | Admitting: "Endocrinology

## 2024-12-04 DIAGNOSIS — E1165 Type 2 diabetes mellitus with hyperglycemia: Secondary | ICD-10-CM

## 2024-12-04 MED ORDER — TRUE METRIX BLOOD GLUCOSE TEST VI STRP
1.0000 | ORAL_STRIP | Freq: Three times a day (TID) | 3 refills | Status: AC
  Filled 2024-12-04: qty 100, 34d supply, fill #0

## 2024-12-04 MED ORDER — DEXCOM G7 SENSOR MISC
1.0000 | 0 refills | Status: AC
  Filled 2024-12-04: qty 9, 90d supply, fill #0

## 2024-12-04 MED ORDER — ACCU-CHEK GUIDE TEST VI STRP: 1.0000 | ORAL_STRIP | Freq: Three times a day (TID) | 3 refills | Status: AC

## 2024-12-04 MED ORDER — ACCU-CHEK GUIDE TEST VI STRP
1.0000 | ORAL_STRIP | Freq: Three times a day (TID) | 3 refills | Status: AC
  Filled 2024-12-04: qty 200, 67d supply, fill #0

## 2024-12-05 ENCOUNTER — Other Ambulatory Visit (HOSPITAL_COMMUNITY): Payer: Self-pay

## 2024-12-13 ENCOUNTER — Other Ambulatory Visit (HOSPITAL_COMMUNITY): Payer: Self-pay

## 2024-12-16 ENCOUNTER — Other Ambulatory Visit: Payer: Self-pay | Admitting: Allergy

## 2024-12-16 ENCOUNTER — Other Ambulatory Visit: Payer: Self-pay

## 2024-12-16 ENCOUNTER — Other Ambulatory Visit (HOSPITAL_COMMUNITY): Payer: Self-pay

## 2024-12-16 MED ORDER — CARBINOXAMINE MALEATE 4 MG PO TABS
8.0000 mg | ORAL_TABLET | Freq: Two times a day (BID) | ORAL | 5 refills | Status: AC
  Filled 2024-12-16: qty 120, 30d supply, fill #0

## 2024-12-17 ENCOUNTER — Other Ambulatory Visit (HOSPITAL_COMMUNITY): Payer: Self-pay

## 2024-12-18 ENCOUNTER — Other Ambulatory Visit (HOSPITAL_COMMUNITY): Payer: Self-pay

## 2024-12-23 ENCOUNTER — Other Ambulatory Visit (HOSPITAL_COMMUNITY): Payer: Self-pay

## 2024-12-23 ENCOUNTER — Encounter (INDEPENDENT_AMBULATORY_CARE_PROVIDER_SITE_OTHER): Payer: Self-pay | Admitting: Otolaryngology

## 2024-12-23 ENCOUNTER — Ambulatory Visit (INDEPENDENT_AMBULATORY_CARE_PROVIDER_SITE_OTHER): Admitting: Otolaryngology

## 2024-12-23 VITALS — BP 109/68 | HR 88 | Ht 66.0 in | Wt 188.0 lb

## 2024-12-23 DIAGNOSIS — J343 Hypertrophy of nasal turbinates: Secondary | ICD-10-CM

## 2024-12-23 DIAGNOSIS — R040 Epistaxis: Secondary | ICD-10-CM | POA: Diagnosis not present

## 2024-12-23 DIAGNOSIS — J329 Chronic sinusitis, unspecified: Secondary | ICD-10-CM | POA: Diagnosis not present

## 2024-12-23 DIAGNOSIS — J324 Chronic pansinusitis: Secondary | ICD-10-CM

## 2024-12-23 MED ORDER — AZITHROMYCIN 250 MG PO TABS
ORAL_TABLET | ORAL | 0 refills | Status: AC
  Filled 2024-12-23: qty 6, 5d supply, fill #0

## 2024-12-23 MED ORDER — PREDNISONE 10 MG (21) PO TBPK
ORAL_TABLET | ORAL | 0 refills | Status: AC
  Filled 2024-12-23: qty 21, 6d supply, fill #0

## 2024-12-23 NOTE — Progress Notes (Unsigned)
 Patient ID: Pamela Roberson, female   DOB: 07-Nov-1956, 69 y.o.   MRN: [id number]  Follow up: Recurrent acute sinusitis, recurrent epistaxis  History of Present Illness Pamela Roberson is a 69 year old female with a history of chronic sinusitis and type 2 diabetes mellitus who presents today complaining of frequent recurrent sinusitis.  She reports persistent throat congestion, diffuse facial pain, and headaches for at least three months. She describes the facial pain as all over and notes associated swelling of the head.   She was evaluated at urgent care three months ago and prescribed two courses of amoxicillin -clavulanate, without improvement.  She has allergies to levofloxacin and erythromycin, but tolerates azithromycin  and doxycycline .  She continues to have mild, intermittent epistaxis, but has not experienced any severe or concerning bleeding since her last significant episode in April of the previous year.  She was previously treated with nasal cautery.  Exam: General: Communicates without difficulty, well nourished, no acute distress. Head: Normocephalic, no evidence injury, no tenderness, facial buttresses intact without stepoff. Face/sinus: No tenderness to palpation and percussion. Facial movement is normal and symmetric. Eyes: PERRL, EOMI. No scleral icterus, conjunctivae clear. Neuro: CN II exam reveals vision grossly intact.  No nystagmus at any point of gaze. Ears: Auricles well formed without lesions.  Ear canals are intact without mass or lesion.  No erythema or edema is appreciated.  The TMs are intact without fluid. Nose: External evaluation reveals normal support and skin without lesions.  Dorsum is intact.  Anterior rhinoscopy reveals erythematous and edematous mucosa over anterior aspect of inferior turbinates and intact septum.  No purulence noted. Oral:  Oral cavity and oropharynx are intact, symmetric, without erythema or edema.  Mucosa is moist without lesions.  Neck: Full range of motion without pain.  There is no significant lymphadenopathy.  No masses palpable.  Thyroid bed within normal limits to palpation.  Parotid glands and submandibular glands equal bilaterally without mass.  Trachea is midline. Neuro:  CN 2-12 grossly intact.   Assessment & Plan Chronic rhinosinusitis, with frequent acute exacerbations Persistent symptoms for several months, including throat congestion, facial pain, and headaches, refractory to two courses of amoxicillin -clavulanate. Multiple antibiotic allergies limit options; azithromycin  selected due to absence of allergy. No recent CT imaging performed. Oral prednisone  prescribed for inflammation, with consideration of her diabetes and ability to monitor blood glucose.  - Potential risks extensively discussed: prednisone --hyperglycemia, which she is able to monitor and manage. - Prescribed azithromycin  due to prior antibiotic failure and lack of allergy. - Prescribed a 6-day course of oral prednisone  to reduce inflammation, with instructions to monitor blood glucose closely. - Scheduled follow-up in three weeks to reassess symptoms and response to treatment.  Recurrent epistaxis Mild, intermittent epistaxis since last visit without severe episodes. - The previous cauterized areas are well-healed.

## 2024-12-24 ENCOUNTER — Other Ambulatory Visit (HOSPITAL_COMMUNITY): Payer: Self-pay

## 2024-12-24 DIAGNOSIS — J324 Chronic pansinusitis: Secondary | ICD-10-CM | POA: Insufficient documentation

## 2024-12-26 ENCOUNTER — Other Ambulatory Visit (HOSPITAL_COMMUNITY): Payer: Self-pay

## 2024-12-27 ENCOUNTER — Other Ambulatory Visit (HOSPITAL_COMMUNITY): Payer: Self-pay

## 2024-12-31 ENCOUNTER — Other Ambulatory Visit (HOSPITAL_COMMUNITY): Payer: Self-pay

## 2024-12-31 ENCOUNTER — Ambulatory Visit: Payer: Self-pay | Admitting: Nurse Practitioner

## 2025-01-02 ENCOUNTER — Other Ambulatory Visit (HOSPITAL_COMMUNITY): Payer: Self-pay

## 2025-01-03 ENCOUNTER — Other Ambulatory Visit (HOSPITAL_COMMUNITY): Payer: Self-pay

## 2025-01-03 ENCOUNTER — Other Ambulatory Visit: Payer: Self-pay

## 2025-01-06 ENCOUNTER — Other Ambulatory Visit: Payer: Self-pay

## 2025-01-07 ENCOUNTER — Other Ambulatory Visit: Payer: Self-pay | Admitting: "Endocrinology

## 2025-01-07 ENCOUNTER — Other Ambulatory Visit (HOSPITAL_COMMUNITY): Payer: Self-pay

## 2025-01-07 MED ORDER — GLIPIZIDE 5 MG PO TABS
5.0000 mg | ORAL_TABLET | Freq: Every day | ORAL | 1 refills | Status: AC
  Filled 2025-01-07: qty 90, 90d supply, fill #0

## 2025-01-09 ENCOUNTER — Other Ambulatory Visit (HOSPITAL_COMMUNITY): Payer: Self-pay

## 2025-01-10 ENCOUNTER — Other Ambulatory Visit (HOSPITAL_COMMUNITY): Payer: Self-pay

## 2025-01-22 ENCOUNTER — Ambulatory Visit: Admitting: Nurse Practitioner

## 2025-01-24 ENCOUNTER — Ambulatory Visit (INDEPENDENT_AMBULATORY_CARE_PROVIDER_SITE_OTHER): Admitting: Otolaryngology

## 2025-02-04 ENCOUNTER — Ambulatory Visit: Admitting: Cardiology

## 2025-02-11 ENCOUNTER — Ambulatory Visit: Admitting: Podiatry

## 2025-02-20 ENCOUNTER — Ambulatory Visit: Admitting: "Endocrinology

## 2025-04-11 ENCOUNTER — Ambulatory Visit: Admitting: Allergy

## 2025-09-24 ENCOUNTER — Ambulatory Visit: Payer: Self-pay
